# Patient Record
Sex: Female | Born: 1950 | Race: Black or African American | Hispanic: No | Marital: Single | Smoking: Never smoker
Health system: Southern US, Community
[De-identification: ages and names within clinical notes are randomized; demographics above are authoritative.]

## PROBLEM LIST (undated history)

## (undated) DIAGNOSIS — M359 Systemic involvement of connective tissue, unspecified: Secondary | ICD-10-CM

## (undated) DIAGNOSIS — I2510 Atherosclerotic heart disease of native coronary artery without angina pectoris: Secondary | ICD-10-CM

## (undated) DIAGNOSIS — E669 Obesity, unspecified: Secondary | ICD-10-CM

## (undated) DIAGNOSIS — Z973 Presence of spectacles and contact lenses: Secondary | ICD-10-CM

## (undated) DIAGNOSIS — I10 Essential (primary) hypertension: Secondary | ICD-10-CM

## (undated) DIAGNOSIS — M1990 Unspecified osteoarthritis, unspecified site: Secondary | ICD-10-CM

## (undated) DIAGNOSIS — IMO0001 Reserved for inherently not codable concepts without codable children: Secondary | ICD-10-CM

## (undated) DIAGNOSIS — I509 Heart failure, unspecified: Secondary | ICD-10-CM

## (undated) DIAGNOSIS — G4733 Obstructive sleep apnea (adult) (pediatric): Secondary | ICD-10-CM

## (undated) DIAGNOSIS — F419 Anxiety disorder, unspecified: Secondary | ICD-10-CM

## (undated) DIAGNOSIS — Z531 Procedure and treatment not carried out because of patient's decision for reasons of belief and group pressure: Secondary | ICD-10-CM

## (undated) DIAGNOSIS — G4730 Sleep apnea, unspecified: Secondary | ICD-10-CM

## (undated) DIAGNOSIS — D509 Iron deficiency anemia, unspecified: Secondary | ICD-10-CM

## (undated) DIAGNOSIS — E039 Hypothyroidism, unspecified: Secondary | ICD-10-CM

## (undated) DIAGNOSIS — Z9989 Dependence on other enabling machines and devices: Secondary | ICD-10-CM

## (undated) DIAGNOSIS — N189 Chronic kidney disease, unspecified: Secondary | ICD-10-CM

## (undated) DIAGNOSIS — E785 Hyperlipidemia, unspecified: Secondary | ICD-10-CM

## (undated) DIAGNOSIS — R0600 Dyspnea, unspecified: Secondary | ICD-10-CM

## (undated) DIAGNOSIS — R011 Cardiac murmur, unspecified: Secondary | ICD-10-CM

## (undated) DIAGNOSIS — Z9889 Other specified postprocedural states: Secondary | ICD-10-CM

## (undated) DIAGNOSIS — N289 Disorder of kidney and ureter, unspecified: Secondary | ICD-10-CM

## (undated) HISTORY — DX: Chronic diastolic (congestive) heart failure: I50.32

## (undated) HISTORY — PX: TOE SURGERY: SHX1073

## (undated) HISTORY — DX: Hypothyroidism, unspecified: E03.9

## (undated) HISTORY — DX: Reserved for inherently not codable concepts without codable children: IMO0001

## (undated) HISTORY — PX: LUMBAR DISC SURGERY: SHX700

## (undated) HISTORY — PX: KNEE SURGERY: SHX244

## (undated) HISTORY — DX: Other specified postprocedural states: Z98.890

## (undated) HISTORY — DX: Sleep apnea, unspecified: G47.30

## (undated) HISTORY — PX: BREAST BIOPSY: SHX20

## (undated) HISTORY — DX: Obesity, unspecified: E66.9

## (undated) HISTORY — PX: TREATMENT FISTULA ANAL: SUR1390

## (undated) HISTORY — DX: Procedure and treatment not carried out because of patient's decision for reasons of belief and group pressure: Z53.1

---

## 1980-02-05 HISTORY — PX: BREAST REDUCTION SURGERY: SHX8

## 1983-02-05 HISTORY — PX: REDUCTION MAMMAPLASTY: SUR839

## 1995-02-05 HISTORY — PX: TOTAL ABDOMINAL HYSTERECTOMY: SHX209

## 1999-02-22 ENCOUNTER — Encounter: Admission: RE | Admit: 1999-02-22 | Discharge: 1999-02-22 | Payer: Self-pay | Admitting: Obstetrics and Gynecology

## 1999-02-22 ENCOUNTER — Encounter: Payer: Self-pay | Admitting: Obstetrics and Gynecology

## 1999-03-06 ENCOUNTER — Encounter: Payer: Self-pay | Admitting: Obstetrics and Gynecology

## 1999-03-06 ENCOUNTER — Encounter: Admission: RE | Admit: 1999-03-06 | Discharge: 1999-03-06 | Payer: Self-pay | Admitting: Obstetrics and Gynecology

## 2000-04-10 ENCOUNTER — Inpatient Hospital Stay (HOSPITAL_COMMUNITY): Admission: RE | Admit: 2000-04-10 | Discharge: 2000-04-11 | Payer: Self-pay | Admitting: Neurosurgery

## 2000-08-23 ENCOUNTER — Ambulatory Visit (HOSPITAL_COMMUNITY): Admission: RE | Admit: 2000-08-23 | Discharge: 2000-08-23 | Payer: Self-pay | Admitting: Neurosurgery

## 2000-10-02 ENCOUNTER — Encounter: Admission: RE | Admit: 2000-10-02 | Discharge: 2000-12-31 | Payer: Self-pay

## 2000-10-27 ENCOUNTER — Encounter: Admission: RE | Admit: 2000-10-27 | Discharge: 2000-10-27 | Payer: Self-pay | Admitting: Orthopedic Surgery

## 2000-10-27 ENCOUNTER — Encounter: Payer: Self-pay | Admitting: Orthopedic Surgery

## 2000-12-03 ENCOUNTER — Encounter: Admission: RE | Admit: 2000-12-03 | Discharge: 2000-12-17 | Payer: Self-pay | Admitting: Neurosurgery

## 2000-12-04 ENCOUNTER — Ambulatory Visit (HOSPITAL_COMMUNITY): Admission: RE | Admit: 2000-12-04 | Discharge: 2000-12-04 | Payer: Self-pay | Admitting: Interventional Cardiology

## 2000-12-05 ENCOUNTER — Encounter: Payer: Self-pay | Admitting: Interventional Cardiology

## 2000-12-22 ENCOUNTER — Inpatient Hospital Stay (HOSPITAL_COMMUNITY): Admission: EM | Admit: 2000-12-22 | Discharge: 2000-12-25 | Payer: Self-pay | Admitting: Emergency Medicine

## 2000-12-22 ENCOUNTER — Encounter: Payer: Self-pay | Admitting: Emergency Medicine

## 2000-12-23 ENCOUNTER — Encounter: Payer: Self-pay | Admitting: Family Medicine

## 2000-12-24 ENCOUNTER — Encounter: Payer: Self-pay | Admitting: Family Medicine

## 2000-12-25 ENCOUNTER — Encounter: Admission: RE | Admit: 2000-12-25 | Discharge: 2000-12-25 | Payer: Self-pay | Admitting: Family Medicine

## 2001-04-24 ENCOUNTER — Ambulatory Visit (HOSPITAL_COMMUNITY): Admission: RE | Admit: 2001-04-24 | Discharge: 2001-04-24 | Payer: Self-pay | Admitting: Neurosurgery

## 2001-05-07 ENCOUNTER — Encounter: Payer: Self-pay | Admitting: Internal Medicine

## 2001-05-07 ENCOUNTER — Encounter: Admission: RE | Admit: 2001-05-07 | Discharge: 2001-05-07 | Payer: Self-pay | Admitting: Internal Medicine

## 2001-05-27 ENCOUNTER — Inpatient Hospital Stay (HOSPITAL_COMMUNITY): Admission: AD | Admit: 2001-05-27 | Discharge: 2001-05-31 | Payer: Self-pay | Admitting: Internal Medicine

## 2001-07-23 ENCOUNTER — Inpatient Hospital Stay (HOSPITAL_COMMUNITY): Admission: RE | Admit: 2001-07-23 | Discharge: 2001-07-31 | Payer: Self-pay | Admitting: Neurosurgery

## 2001-07-27 ENCOUNTER — Encounter: Payer: Self-pay | Admitting: Pulmonary Disease

## 2001-07-27 ENCOUNTER — Encounter: Payer: Self-pay | Admitting: Critical Care Medicine

## 2001-07-31 ENCOUNTER — Inpatient Hospital Stay (HOSPITAL_COMMUNITY)
Admission: RE | Admit: 2001-07-31 | Discharge: 2001-08-07 | Payer: Self-pay | Admitting: Physical Medicine & Rehabilitation

## 2001-07-31 ENCOUNTER — Encounter: Payer: Self-pay | Admitting: Physical Medicine & Rehabilitation

## 2001-12-05 ENCOUNTER — Ambulatory Visit (HOSPITAL_COMMUNITY): Admission: RE | Admit: 2001-12-05 | Discharge: 2001-12-05 | Payer: Self-pay | Admitting: Neurosurgery

## 2002-04-16 ENCOUNTER — Emergency Department (HOSPITAL_COMMUNITY): Admission: EM | Admit: 2002-04-16 | Discharge: 2002-04-17 | Payer: Self-pay | Admitting: Emergency Medicine

## 2002-10-27 ENCOUNTER — Ambulatory Visit (HOSPITAL_COMMUNITY): Admission: RE | Admit: 2002-10-27 | Discharge: 2002-10-27 | Payer: Self-pay | Admitting: Neurosurgery

## 2002-12-27 ENCOUNTER — Encounter: Admission: RE | Admit: 2002-12-27 | Discharge: 2002-12-27 | Payer: Self-pay | Admitting: Internal Medicine

## 2003-04-25 ENCOUNTER — Ambulatory Visit (HOSPITAL_BASED_OUTPATIENT_CLINIC_OR_DEPARTMENT_OTHER): Admission: RE | Admit: 2003-04-25 | Discharge: 2003-04-25 | Payer: Self-pay | Admitting: Internal Medicine

## 2003-04-25 ENCOUNTER — Encounter: Payer: Self-pay | Admitting: Internal Medicine

## 2003-09-08 ENCOUNTER — Encounter (HOSPITAL_COMMUNITY): Admission: RE | Admit: 2003-09-08 | Discharge: 2003-12-07 | Payer: Self-pay | Admitting: Interventional Cardiology

## 2003-09-13 ENCOUNTER — Encounter: Admission: RE | Admit: 2003-09-13 | Discharge: 2003-09-13 | Payer: Self-pay | Admitting: Interventional Cardiology

## 2003-09-14 ENCOUNTER — Ambulatory Visit (HOSPITAL_COMMUNITY): Admission: RE | Admit: 2003-09-14 | Discharge: 2003-09-14 | Payer: Self-pay | Admitting: Interventional Cardiology

## 2003-10-18 ENCOUNTER — Emergency Department (HOSPITAL_COMMUNITY): Admission: EM | Admit: 2003-10-18 | Discharge: 2003-10-18 | Payer: Self-pay | Admitting: Family Medicine

## 2003-10-21 ENCOUNTER — Ambulatory Visit (HOSPITAL_COMMUNITY): Admission: RE | Admit: 2003-10-21 | Discharge: 2003-10-21 | Payer: Self-pay | Admitting: Family Medicine

## 2004-02-23 ENCOUNTER — Encounter: Admission: RE | Admit: 2004-02-23 | Discharge: 2004-02-23 | Payer: Self-pay | Admitting: Obstetrics and Gynecology

## 2004-04-27 ENCOUNTER — Ambulatory Visit: Payer: Self-pay | Admitting: Internal Medicine

## 2005-04-25 ENCOUNTER — Ambulatory Visit: Payer: Self-pay | Admitting: Internal Medicine

## 2005-05-09 ENCOUNTER — Observation Stay (HOSPITAL_COMMUNITY): Admission: EM | Admit: 2005-05-09 | Discharge: 2005-05-10 | Payer: Self-pay | Admitting: Emergency Medicine

## 2005-05-15 ENCOUNTER — Encounter: Admission: RE | Admit: 2005-05-15 | Discharge: 2005-05-15 | Payer: Self-pay | Admitting: Internal Medicine

## 2005-12-11 ENCOUNTER — Encounter: Admission: RE | Admit: 2005-12-11 | Discharge: 2005-12-11 | Payer: Self-pay | Admitting: Neurosurgery

## 2006-03-07 ENCOUNTER — Ambulatory Visit: Payer: Self-pay | Admitting: Internal Medicine

## 2006-05-15 ENCOUNTER — Encounter: Admission: RE | Admit: 2006-05-15 | Discharge: 2006-05-15 | Payer: Self-pay | Admitting: Obstetrics and Gynecology

## 2007-01-08 DIAGNOSIS — E669 Obesity, unspecified: Secondary | ICD-10-CM | POA: Insufficient documentation

## 2007-01-08 DIAGNOSIS — J3089 Other allergic rhinitis: Secondary | ICD-10-CM

## 2007-01-08 DIAGNOSIS — G4733 Obstructive sleep apnea (adult) (pediatric): Secondary | ICD-10-CM | POA: Insufficient documentation

## 2007-01-08 DIAGNOSIS — J302 Other seasonal allergic rhinitis: Secondary | ICD-10-CM | POA: Insufficient documentation

## 2007-01-30 ENCOUNTER — Emergency Department (HOSPITAL_COMMUNITY): Admission: EM | Admit: 2007-01-30 | Discharge: 2007-01-30 | Payer: Self-pay | Admitting: Emergency Medicine

## 2007-02-12 ENCOUNTER — Ambulatory Visit: Payer: Self-pay | Admitting: Internal Medicine

## 2007-02-12 DIAGNOSIS — E039 Hypothyroidism, unspecified: Secondary | ICD-10-CM | POA: Insufficient documentation

## 2007-02-18 ENCOUNTER — Encounter: Payer: Self-pay | Admitting: Internal Medicine

## 2007-05-18 ENCOUNTER — Ambulatory Visit: Payer: Self-pay | Admitting: Internal Medicine

## 2007-05-21 ENCOUNTER — Encounter: Admission: RE | Admit: 2007-05-21 | Discharge: 2007-05-21 | Payer: Self-pay | Admitting: Obstetrics and Gynecology

## 2007-07-01 ENCOUNTER — Encounter
Admission: RE | Admit: 2007-07-01 | Discharge: 2007-07-01 | Payer: Self-pay | Admitting: Physical Medicine and Rehabilitation

## 2008-02-27 ENCOUNTER — Emergency Department (HOSPITAL_COMMUNITY): Admission: EM | Admit: 2008-02-27 | Discharge: 2008-02-27 | Payer: Self-pay | Admitting: Emergency Medicine

## 2008-03-17 ENCOUNTER — Encounter: Payer: Self-pay | Admitting: Internal Medicine

## 2008-03-21 ENCOUNTER — Telehealth (INDEPENDENT_AMBULATORY_CARE_PROVIDER_SITE_OTHER): Payer: Self-pay | Admitting: *Deleted

## 2008-03-30 ENCOUNTER — Telehealth: Payer: Self-pay | Admitting: Internal Medicine

## 2008-05-02 ENCOUNTER — Ambulatory Visit: Payer: Self-pay | Admitting: Internal Medicine

## 2008-05-05 ENCOUNTER — Emergency Department (HOSPITAL_COMMUNITY): Admission: EM | Admit: 2008-05-05 | Discharge: 2008-05-05 | Payer: Self-pay | Admitting: Emergency Medicine

## 2008-05-06 ENCOUNTER — Ambulatory Visit: Admission: RE | Admit: 2008-05-06 | Discharge: 2008-05-06 | Payer: Self-pay | Admitting: Emergency Medicine

## 2008-05-06 ENCOUNTER — Encounter (INDEPENDENT_AMBULATORY_CARE_PROVIDER_SITE_OTHER): Payer: Self-pay | Admitting: Emergency Medicine

## 2008-05-06 ENCOUNTER — Ambulatory Visit: Payer: Self-pay | Admitting: Surgery

## 2008-05-07 ENCOUNTER — Emergency Department (HOSPITAL_COMMUNITY): Admission: EM | Admit: 2008-05-07 | Discharge: 2008-05-08 | Payer: Self-pay | Admitting: Emergency Medicine

## 2008-05-23 ENCOUNTER — Encounter: Admission: RE | Admit: 2008-05-23 | Discharge: 2008-05-23 | Payer: Self-pay | Admitting: Internal Medicine

## 2008-06-13 ENCOUNTER — Ambulatory Visit: Payer: Self-pay | Admitting: Surgery

## 2008-06-14 ENCOUNTER — Ambulatory Visit: Payer: Self-pay | Admitting: Surgery

## 2008-08-18 ENCOUNTER — Telehealth: Payer: Self-pay | Admitting: Internal Medicine

## 2008-08-24 ENCOUNTER — Emergency Department (HOSPITAL_COMMUNITY): Admission: EM | Admit: 2008-08-24 | Discharge: 2008-08-24 | Payer: Self-pay | Admitting: Emergency Medicine

## 2008-08-24 ENCOUNTER — Encounter: Payer: Self-pay | Admitting: Internal Medicine

## 2008-09-12 ENCOUNTER — Ambulatory Visit: Payer: Self-pay | Admitting: Surgery

## 2008-09-25 ENCOUNTER — Encounter: Payer: Self-pay | Admitting: Internal Medicine

## 2008-09-29 ENCOUNTER — Encounter: Admission: RE | Admit: 2008-09-29 | Discharge: 2008-09-29 | Payer: Self-pay | Admitting: Neurosurgery

## 2008-11-07 ENCOUNTER — Encounter: Payer: Self-pay | Admitting: Internal Medicine

## 2008-12-10 ENCOUNTER — Emergency Department (HOSPITAL_COMMUNITY): Admission: EM | Admit: 2008-12-10 | Discharge: 2008-12-10 | Payer: Self-pay | Admitting: Emergency Medicine

## 2009-01-31 ENCOUNTER — Emergency Department (HOSPITAL_COMMUNITY): Admission: EM | Admit: 2009-01-31 | Discharge: 2009-01-31 | Payer: Self-pay | Admitting: Family Medicine

## 2009-02-10 ENCOUNTER — Emergency Department (HOSPITAL_COMMUNITY): Admission: EM | Admit: 2009-02-10 | Discharge: 2009-02-10 | Payer: Self-pay | Admitting: Family Medicine

## 2009-04-23 ENCOUNTER — Emergency Department (HOSPITAL_COMMUNITY): Admission: EM | Admit: 2009-04-23 | Discharge: 2009-04-23 | Payer: Self-pay | Admitting: Emergency Medicine

## 2009-05-02 ENCOUNTER — Ambulatory Visit: Payer: Self-pay | Admitting: Internal Medicine

## 2009-05-02 DIAGNOSIS — R011 Cardiac murmur, unspecified: Secondary | ICD-10-CM | POA: Insufficient documentation

## 2009-06-02 ENCOUNTER — Encounter: Admission: RE | Admit: 2009-06-02 | Discharge: 2009-06-02 | Payer: Self-pay | Admitting: Internal Medicine

## 2009-06-26 ENCOUNTER — Emergency Department (HOSPITAL_COMMUNITY): Admission: EM | Admit: 2009-06-26 | Discharge: 2009-06-26 | Payer: Self-pay | Admitting: Emergency Medicine

## 2009-08-28 ENCOUNTER — Emergency Department (HOSPITAL_COMMUNITY): Admission: EM | Admit: 2009-08-28 | Discharge: 2009-08-28 | Payer: Self-pay | Admitting: Family Medicine

## 2010-02-25 ENCOUNTER — Encounter: Payer: Self-pay | Admitting: Interventional Cardiology

## 2010-02-26 ENCOUNTER — Encounter: Payer: Self-pay | Admitting: Surgery

## 2010-03-06 NOTE — Miscellaneous (Signed)
Summary: CPAP Set Up Info/[facility]  CPAP Set Up Info/[facility]   Imported By: Edmonia James 08/30/2008 12:19:27  _____________________________________________________________________  External Attachment:    Type:   Image     Comment:   External Document

## 2010-03-06 NOTE — Assessment & Plan Note (Signed)
Summary: FU///KP   Visit Type:  Follow-up PCP:  Avebuere  Chief Complaint:  check up.  History of Present Illness: OSA Allergic rhinits     Using CPAP only when not sleeping well- takes off feeling smothered. Using once/week. does feel more rested when uses it. Gets pressure "sinus headache" not related to cpap Uses a cortisone nasal spray and daily astelin   Current Allergies: No known allergies   Past Medical History:    Reviewed history and no changes required:       Back surgery x 2       arthroscopy       obesity       hypothyroid   Family History:    Reviewed history and no changes required:  Social History:    Patient never smoked.    Risk Factors:  Tobacco use:  never   Review of Systems  ENT      Complains of nasal congestion.      Denies ringing in the ears, decreased hearing, nosebleeds, difficulty swallowing, and hoarseness.  CV      Denies difficulty breathing at night, chest pain or discomfort, racing/skipping heart beats, fatigue, palpitations, swelling of hands or feet, and difficulty breathing while lying down.  Resp      Denies cough, coughing up blood, chest discomfort, and wheezing.      not snoring and less sleepiness if she wears cpap   Vital Signs:  Patient Profile:   60 Years Old Female Weight:      347 pounds O2 Sat:      98 % O2 treatment:    Room Air Pulse rate:   85 / minute BP sitting:   160 / 100  (left arm) Cuff size:   large  Vitals Entered By: Marcelino Freestone RMA (February 12, 2007 11:59 AM)             Is Patient Diabetic? Yes  Comments meds reviewed ..................................................................Marland KitchenMarcelino Freestone RMA  February 12, 2007 12:05 PM      Physical Exam  General:     obese.   Eyes:     disconjugate Mouth:     no deformity or lesions   Melampatti Class III.   Neck:     no masses, thyromegaly, or abnormal cervical nodes Chest Wall:     no deformities noted Lungs:  clear bilaterally to auscultation and percussion Heart:     regular rate and rhythm, S1, S2 without murmurs, rubs, gallops, or clicks     Impression & Recommendations:  Problem # 1:  OBSTRUCTIVE SLEEP APNEA (ICD-327.23) Inadequate cpap compliance will autotitrate Orders: Est. Patient Level III [id number]) DME Referral (DME)   Problem # 2:  ALLERGIC RHINITIS (ICD-477.9) discussed Neti pot for saline lavage Her updated medication list for this problem includes:    Astelin 137 Mcg/spray Soln (Azelastine hcl) .Marland Kitchen... As needed  Orders: Est. Patient Level III [id number])    Patient Instructions: 1)  Please schedule a follow-up appointment in 3 months. 2)  Try Neti pot for sinuses 3)  [facility] will work withy you to recheck cpap pressure    ]

## 2010-03-06 NOTE — Assessment & Plan Note (Signed)
Summary: FOLLOW UP/ MBW   Visit Type:  Follow-up PCP:  Avebuere  Chief Complaint:  follow up.  History of Present Illness: Current Problems:  UNSPECIFIED HYPOTHYROIDISM (ICD-244.9) ALLERGIC RHINITIS (ICD-477.9) OBESITY (ICD-278.00) OBSTRUCTIVE SLEEP APNEA (ICD-327.23)  She returns for follow-up of allergic rhinitis and for obstructive sleep apnea.  She has liked the Neti pot and is using it daily.  There has been little pollen problem so far this season.  She continues using CPAP at 11 CWP.  She is not having sinus headaches and credits the Neti pote for this.  Shehas not needed Astelin.  She is satisfied with her current treatment.       Current Allergies (reviewed today): ! SULFA  Past Medical History:    Reviewed history from 02/12/2007 and no changes required:       Back surgery x 2       arthroscopy       obesity       hypothyroid   Social History:    Reviewed history from 02/12/2007 and no changes required:       Patient never smoked.     Review of Systems      See HPI   Vital Signs:  Patient Profile:   60 Years Old Female Weight:      335 pounds O2 Sat:      100 % O2 treatment:    Room Air Pulse rate:   65 / minute BP sitting:   140 / 80  (right arm) Cuff size:   follow large  Vitals Entered By: Marcelino Freestone RMA (May 18, 2007 10:06 AM)             Is Patient Diabetic? Yes  Comments Medications reviewed with patient ..................................................................Marland KitchenMarcelino Freestone RMA  May 18, 2007 10:10 AM      Physical Exam  General:     obese.  alert  Eyes:     amblyopia conjunctivae are normal Nose:     pale mucosa, clear mucus Neck:     no JVD.   Lungs:     clear bilaterally to auscultation and percussion Heart:     regular rate and rhythm, S1, S2 without murmurs, rubs, gallops, or clicks Cervical Nodes:     no significant adenopathy     Impression & Recommendations:  Problem # 1:  ALLERGIC RHINITIS  (ICD-477.9) Good control, satisfacory to her. She will continue using the Neti pot through the pollen season. Her updated medication list for this problem includes:    Astelin 137 Mcg/spray Soln (Azelastine hcl) .Marland Kitchen... As needed   Problem # 2:  OBSTRUCTIVE SLEEP APNEA (ICD-327.23) Good control and compliant with Cpap 11, but she would be betrter if she lost weight.  Medications Added to Medication List This Visit: 1)  Allopurinol 100 Mg Tabs (Allopurinol) .... One tablet two times a day 2)  Cpap 11 Cwp [facility]    Patient Instructions: 1)  Please schedule a follow-up appointment in 1 year. 2)  Continue Neti Pot as needed 3)  Continue your cpap at 11 cwp    ]

## 2010-03-06 NOTE — Assessment & Plan Note (Signed)
Summary: 12 months/apc   Primary Provider/Referring Provider:  Luretha Rued  CC:  Yearly follow up visit-no complaints..  History of Present Illness:  05/18/07- Allergic Rhinits, OSA She returns for follow-up of allergic rhinitis and for obstructive sleep apnea.  She has liked the Neti pot and is using it daily.  There has been little pollen problem so far this season.  She continues using CPAP at 11 CWP.  She is not having sinus headaches and credits the Neti pot for this.  She has not needed Astelin.  She is satisfied with her current treatment.  05/24/08- Allergic rhinitis, OSA Rhinitis- good winter without significant respiratory events. No longer needs to use Astelin since she began using Neti pot- currently used about twice weekly. Seasonal itching of nose and eyes with sneezing began for this spring about 2 weeks ago. Discussed antihistamines.  Sleep- Still uncomfortable with cpap- rarely uses it now- says it stings her nose even with humidifier. nasal saline gel helps some. Sleeps alone, not aware of snoring. Denies daytime sleepiness. Often WASO 3AM and may not get back to sleep even with Lunesta. Doesn't take naps. Humidifier in bedroom helps sleep.   05/24/09- Allergic rhinitis, OSA Noted mild sneeze and sniff before the rains pushed pollen counts down. Able to use CPAP at 10 and says pressure of 10 is ok. Indicates good compliance and denies daytime sleepiness.   Current Medications (verified): 1)  Synthroid 75 Mcg  Tabs (Levothyroxine Sodium) .... Take 1 Tablet By Mouth Three Times A Day 2)  Metformin Hcl 500 Mg Tabs (Metformin Hcl) .Marland Kitchen.. 1 Two Times A Day 3)  Clonidine Hcl 0.2 Mg/24hr Ptwk (Clonidine Hcl) .... Once A Week 4)  Crestor 10 Mg  Tabs (Rosuvastatin Calcium) .... Take 1 Tablet By Mouth Once A Day 5)  Allopurinol 100 Mg  Tabs (Allopurinol) .... One Tablet Two Times A Day 6)  Alprazolam 0.25 Mg  Tabs (Alprazolam) .... Take 1 Tablet By Mouth Two Times A Day As  Needed 7)  Benicar Hct 40-25 Mg Tabs (Olmesartan Medoxomil-Hctz) .... Take 1 By Mouth Once Daily 8)  Hydrocodone-Acetaminophen 5-500 Mg  Tabs (Hydrocodone-Acetaminophen) .... As Needed 9)  Lantus 100 Unit/ml  Soln (Insulin Glargine) .... As Directed 10)  Cpap 10 Cwp [facility] 11)  Cpap Mask of Choice, Supplies 12)  Topiramate 25 Mg Tabs (Topiramate) .Marland Kitchen.. 1 At Bedtime As Needed 13)  Aspirin 81 Mg Tbec (Aspirin) .... Take 1 By Mouth Once Daily 14)  Klor-Con M20 20 Meq Cr-Tabs (Potassium Chloride Crys Cr) .... 2 Once Daily As Needed 15)  Torsemide 20 Mg Tabs (Torsemide) .... Take 1 By Mouth Every Other Day 16)  Zolpidem Tartrate 10 Mg Tabs (Zolpidem Tartrate) .Marland Kitchen.. 1 By Mouth At Bedtime As Needed 17)  Lotrel 5-10 Mg Caps (Amlodipine Besy-Benazepril Hcl) .... Take 1 By Mouth Once Daily  Allergies (verified): 1)  ! 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    Vital Signs:  Patient profile:   60 year old female Height:      70 inches Weight:      300.38 pounds BMI:     43.26 O2 Sat:      100 % on Room air Pulse rate:   73 / minute BP sitting:   124 / 80  (left arm) Cuff size:   large  Vitals Entered By: Clayborne Dana CMA (May 02, 2009 9:45 AM)  O2 Flow:  Room air  Physical Exam  Additional Exam:  General: A/Ox3; pleasant and cooperative, NAD, obese SKIN: no rash, lesions NODES: no lymphadenopathy HEENT: [place]/AT, EOM- WNL, Strabismus, Conjuctivae- clear, PERRLA, TM-WNL, Nose- clear, Throat- clear and wnl, Mallampati  III-IV NECK: Supple w/ fair ROM, JVD- none, normal carotid impulses w/o bruits Thyroid- normal to palpation CHEST: Clear to P&A HEART: RRR, 2/6 systolic precordial murmur ABDOMEN: obese [id number], nl pulses, no edema  NEURO: Grossly intact to observation      Impression & Recommendations:  Problem # 1:  OBSTRUCTIVE SLEEP APNEA (ICD-327.23)  Weight loss would help. Compliance and pressure control seem adequate. She is going for a replacement mask and we discussed choices.  Problem # 2:  ALLERGIC RHINITIS (ICD-477.9)  Mild seasonal rhinitis should respond to occasional antihistamine.  Medications Added to Medication List This Visit: 1)  Clonidine Hcl 0.2 Mg/24hr Ptwk (Clonidine hcl) .... Once a week 2)  Benicar Hct 40-25 Mg Tabs (Olmesartan medoxomil-hctz) .... Take 1 by mouth once daily 3)  Aspirin 81 Mg Tbec (Aspirin) .... Take 1 by mouth once daily 4)  Torsemide 20 Mg Tabs (Torsemide) .... Take 1 by mouth every other day 5)  Lotrel 5-10 Mg Caps (Amlodipine besy-benazepril hcl) .... Take 1 by mouth once daily  Other Orders: Est. Patient Level III [id number])  Patient Instructions: 1)  Please schedule a follow-up appointment in 1 year. 2)  Continue CPAP at 10, all night every night. Call if you have problems

## 2010-03-06 NOTE — Progress Notes (Signed)
Summary: Tiffany [name] rx  Phone Note From Pharmacy   [facility] [address] [phone]* Call For: Tiffany [name]  Summary of Call: Prior auth received from pharmacy for Ambien CR 12.5mg .  Insurance prefers [place].  Changed rx to Lunesta 2mg  Take 1 tab by mouth at bedtime as needed #30 with 5 refills per CY.  Called pharmacy gave verbal order.   Initial call taken by: Freddrick March RN,  March 30, 2008 4:19 PM    New/Updated Medications: LUNESTA 2 MG TABS (ESZOPICLONE) Take 1 tab by mouth at bedtime as needed   Prescriptions: LUNESTA 2 MG TABS (ESZOPICLONE) Take 1 tab by mouth at bedtime as needed  #30 x 5   Entered by:   Freddrick March RN   Authorized by:   Deneise Lever MD   Signed by:   Freddrick March RN on 03/30/2008   Method used:   Telephoned to ...       [facility] [address] [phone]* (retail)       [address]       [place], [place]  [address]       Ph: [phone] or [phone]       Fax: [phone]   RxID:   [id number]

## 2010-03-06 NOTE — Miscellaneous (Signed)
Summary: change ambien to zolpidem  Clinical Lists Changes received paper request from pharmacy, stating that patient wants to change from ambien 12.5 to one of the alternatives.  Dr. Tenna Child for patient to be switched to Zolpidem (which was given as an option that is covered by pt's inurance) 10mg  #30, 1 by mouth at bedtime as needed with as needed refills.  LMOM to notify pt of this change, and med added to med list/telephoned to pt's pharmacy.  Parke Poisson CNA  March 17, 2008 4:12 PM  Medications: Changed medication from [facility] CR 12.5 MG  TBCR (ZOLPIDEM TARTRATE) Take 1 tab by mouth at bedtime as needed to ZOLPIDEM TARTRATE 10 MG TABS (ZOLPIDEM TARTRATE) Take 1 tab by mouth at bedtime as needed - Signed Rx of ZOLPIDEM TARTRATE 10 MG TABS (ZOLPIDEM TARTRATE) Take 1 tab by mouth at bedtime as needed;  #30 x PRN;  Signed;  Entered by: Parke Poisson CNA;  Authorized by: Deneise Lever MD;  Method used: [facility] [address] [phone]*, [address] [place], [place], [place]  [address], Ph: [phone] or [phone], Fax: [phone]    Prescriptions: ZOLPIDEM TARTRATE 10 MG TABS (ZOLPIDEM TARTRATE) Take 1 tab by mouth at bedtime as needed  #30 x PRN   Entered by:   Parke Poisson CNA   Authorized by:   Deneise Lever MD   Signed by:   Parke Poisson CNA on 03/17/2008   Method used:   Telephoned to ...       [facility] [address] [phone]* (retail)       [address]       [place], [place]  [address]       Ph: [phone] or [phone]       Fax: [phone]   RxID:   [id number]

## 2010-03-06 NOTE — Assessment & Plan Note (Signed)
Summary: rov/apc   Primary Provider/Referring Provider:  Luretha Rued  CC:  Annual followup.  Pt denies any complaits today.Tiffany [name]  History of Present Illness: Current Problems:  UNSPECIFIED HYPOTHYROIDISM (ICD-244.9) ALLERGIC RHINITIS (ICD-477.9) OBESITY (ICD-278.00) OBSTRUCTIVE SLEEP APNEA (ICD-327.23)  05/18/07- Allergic Rhinits, OSA She returns for follow-up of allergic rhinitis and for obstructive sleep apnea.  She has liked the Neti pot and is using it daily.  There has been little pollen problem so far this season.  She continues using CPAP at 11 CWP.  She is not having sinus headaches and credits the Neti pot for this.  She has not needed Astelin.  She is satisfied with her current treatment.  05-26-08- Allergic rhinitis, OSA Rhinitis- good winter without significant respiratory events. No longer needs to use Astelin since she began using Neti pot- currently used about twice weekly. Seasonal itching of nose and eyes with sneezing began for this spring about 2 weeks ago. Discussed antihistamines.  Sleep- Still uncomfortable with cpap- rarely uses it now- says it stings her nose even with humidifier. nasal saline gel helps some. Sleeps alone, not aware of snoring. Denies daytime sleepiness. Often WASO 3AM and may not get back to sleep even with Lunesta. Doesn't take naps. Humidifier in bedroom helps sleep.        Current Medications (verified): 1)  Synthroid 75 Mcg  Tabs (Levothyroxine Sodium) .... Take 1 Tablet By Mouth Three Times A Day 2)  Metformin Hcl 500 Mg Tabs (Metformin Hcl) .Tiffany [name].. 1 Two Times A Day 3)  Clonidine Hcl 0.2 Mg  Tabs (Clonidine Hcl) .... Take 1 Tablet By Mouth Three Times A Day 4)  Crestor 10 Mg  Tabs (Rosuvastatin Calcium) .... Take 1 Tablet By Mouth Once A Day 5)  Allopurinol 100 Mg  Tabs (Allopurinol) .... One Tablet Two Times A Day 6)  Alprazolam 0.25 Mg  Tabs (Alprazolam) .... Take 1 Tablet By Mouth Two Times A Day As Needed 7)  Avalide 300-25 Mg  Tabs  (Irbesartan-Hydrochlorothiazide) .... Take 1 Tablet By Mouth Once A Day 8)  Hydrocodone-Acetaminophen 5-500 Mg  Tabs (Hydrocodone-Acetaminophen) .... As Needed 9)  Lantus 100 Unit/ml  Soln (Insulin Glargine) .... As Directed 10)  Cpap 11 Cwp [facility] 11)  Topiramate 25 Mg Tabs (Topiramate) .Tiffany [name].. 1 At Bedtime As Needed 12)  Bayer Aspirin 325 Mg Tabs (Aspirin) .Tiffany [name].. 1 Every Am 13)  Klor-Con M20 20 Meq Cr-Tabs (Potassium Chloride Crys Cr) .... 2 Once Daily As Needed 14)  Torsemide 20 Mg Tabs (Torsemide) .... 2 Once Daily As Needed 15)  Nexium 40 Mg Cpdr (Esomeprazole Magnesium) .Tiffany [name].. 1 Once Daily As Needed  Allergies (verified): 1)  ! 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        School bus driver  Review  of Systems      See HPI       Denies headache,  chest pain, dyspnea, n/v/d, weight loss, fever, edema.    Vital Signs:  Patient profile:   60 year old female Height:      70 inches Weight:      310 pounds BMI:     44.64 O2 Sat:      57 % BP sitting:   124 / 78  (left arm) Cuff size:   large  Vitals Entered By: Tilden Dome (May 02, 2008 9:41 AM)  O2 Sat on room air at rest %:  100  Physical Exam  Additional Exam:  General: A/Ox3; pleasant and cooperative, NAD, SKIN: no rash, lesions NODES: no lymphadenopathy HEENT: [place]/AT, EOM- WNL, Strabismus, Conjuctivae- clear, PERRLA, TM-WNL, Nose- clear, Throat- clear and wnl NECK: Supple w/ fair ROM, JVD- none, normal carotid impulses w/o bruits Thyroid- normal to palpation CHEST: Clear to P&A HEART: RRR, no m/g/r heard ABDOMEN: obese [id number], nl pulses, no edema  NEURO: Grossly intact to observation      Pulmonary Function Test Height (in.): 70 Gender: Female  Impression & Recommendations:  Problem # 1:  OBSTRUCTIVE SLEEP APNEA (ICD-327.23) Noncompliant/ intolerant of cpap. Needs to lose weight. We discussed alternatives. Not motivated.  Problem # 2:  ALLERGIC RHINITIS (ICD-477.9) Available symptom therapy reviewed. The following medications were removed from the medication list:    Astelin 137 Mcg/spray Soln (Azelastine hcl) .Tiffany [name]... As needed  Problem # 3:  OBSTRUCTIVE SLEEP APNEA (ICD-327.23)  Problem # 4:  ALLERGIC RHINITIS (ICD-477.9)  Complete Medication List: 1)  Synthroid 75 Mcg Tabs (Levothyroxine sodium) .... Take 1 tablet by mouth three times a day 2)  Metformin Hcl 500 Mg Tabs (Metformin hcl) .Tiffany [name].. 1 two times a day 3)  Clonidine Hcl 0.2 Mg Tabs (Clonidine hcl) .... Take 1 tablet by mouth three times a day 4)  Crestor 10 Mg Tabs (Rosuvastatin calcium) .... Take 1 tablet by mouth once a day 5)  Allopurinol 100 Mg Tabs (Allopurinol) .... One tablet two times a day 6)  Alprazolam 0.25 Mg Tabs (Alprazolam)  .... Take 1 tablet by mouth two times a day as needed 7)  Avalide 300-25 Mg Tabs (Irbesartan-hydrochlorothiazide) .... Take 1 tablet by mouth once a day 8)  Hydrocodone-acetaminophen 5-500 Mg Tabs (Hydrocodone-acetaminophen) .... As needed 9)  Lantus 100 Unit/ml Soln (Insulin glargine) .... As directed 10)  Cpap 10 Cwp [facility]  11)  Topiramate 25 Mg Tabs (Topiramate) .Tiffany [name].. 1 at bedtime as needed 12)  Bayer Aspirin 325 Mg  Tabs (Aspirin) .Tiffany [name].. 1 every am 13)  Klor-con M20 20 Meq Cr-tabs (Potassium chloride crys cr) .... 2 once daily as needed 14)  Torsemide 20 Mg Tabs (Torsemide) .... 2 once daily as needed 15)  Nexium 40 Mg Cpdr (Esomeprazole magnesium) .Tiffany [name].. 1 once daily as needed  Other Orders: Est. Patient Level III [id number]) DME Referral (DME)  Patient Instructions: 1)  Please schedule a follow-up appointment in 1 year. 2)  We will contact [facility] to reduce your cpap to 10. 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

## 2010-03-06 NOTE — Progress Notes (Signed)
Summary: c pap  Phone Note Call from Patient   Caller: Patient Call For: S[place] Summary of Call: need new prescript for c pap machine Initial call taken by: Gustavus Bryant,  August 18, 2008 11:36 AM  Follow-up for Phone Call        CY  is this ok to send order for pt to get pt a new mask.  please advise.  thanks [place]  August 18, 2008 11:42 AM   Additional Follow-up for Phone Call Additional follow up Details #1::        Script written for [facility]- I wrote for mask. Did she need machine or mask?? Additional Follow-up by: Deneise Lever MD,  August 19, 2008 1:29 PM    Additional Follow-up for Phone Call Additional follow up Details #2::    Called pt and she stated that [facility] advised her that she has had cpap machine for over 5 years and she would be eligible for a replacement cpap machine. Pt requesting order for a replacement cpap. Please advise. Rhonda Cobb  August 19, 2008 2:32 PM  New Order sent to [facility] for replacement CPAP machine set at 10 cwp. h/h and supplies. Called pt and she is aware that order was sent this am to [facility]. Rhonda Cobb  August 22, 2008 8:52 AM   New/Updated Medications: * CPAP MASK OF CHOICE, SUPPLIES  Prescriptions: CPAP MASK OF CHOICE, SUPPLIES   #1 x prn   Entered and Authorized by:   Deneise Lever MD   Signed by:   Deneise Lever MD on 08/19/2008   Method used:   Historical   [id number]

## 2010-03-06 NOTE — Progress Notes (Signed)
Summary: prescript  Phone Note Call from Patient   Caller: Patient Call For: Mccann Summary of Call: pharmacy did not receive ambiem prescription pls re send Initial call taken by: Gustavus Bryant,  March 21, 2008 8:33 AM  Follow-up for Phone Call        [facility] that rx [facility] generic ambien 10 mg [facility] [address] Follow-up by: Doroteo Glassman RN,  March 21, 2008 8:50 AM

## 2010-04-21 LAB — POCT URINALYSIS DIP (DEVICE)
Bilirubin Urine: NEGATIVE
Glucose, UA: NEGATIVE mg/dL
Hgb urine dipstick: NEGATIVE
Ketones, ur: NEGATIVE mg/dL
Nitrite: NEGATIVE
Protein, ur: 100 mg/dL — AB
Specific Gravity, Urine: 1.02 (ref 1.005–1.030)
Urobilinogen, UA: 0.2 mg/dL (ref 0.0–1.0)
pH: 7 (ref 5.0–8.0)

## 2010-04-21 LAB — POCT I-STAT, CHEM 8
BUN: 45 mg/dL — ABNORMAL HIGH (ref 6–23)
Calcium, Ion: 1.18 mmol/L (ref 1.12–1.32)
Chloride: 111 mEq/L (ref 96–112)
Creatinine, Ser: 1.9 mg/dL — ABNORMAL HIGH (ref 0.4–1.2)
Glucose, Bld: 113 mg/dL — ABNORMAL HIGH (ref 70–99)
HCT: 40 % (ref 36.0–46.0)
Hemoglobin: 13.6 g/dL (ref 12.0–15.0)
Potassium: 4.8 mEq/L (ref 3.5–5.1)
Sodium: 140 mEq/L (ref 135–145)
TCO2: 24 mmol/L (ref 0–100)

## 2010-04-21 LAB — URINE CULTURE
Colony Count: NO GROWTH
Culture: NO GROWTH

## 2010-04-27 LAB — POCT URINALYSIS DIP (DEVICE)
Bilirubin Urine: NEGATIVE
Glucose, UA: NEGATIVE mg/dL
Hgb urine dipstick: NEGATIVE
Ketones, ur: NEGATIVE mg/dL
Nitrite: NEGATIVE
Protein, ur: 100 mg/dL — AB
Specific Gravity, Urine: 1.015 (ref 1.005–1.030)
Urobilinogen, UA: 0.2 mg/dL (ref 0.0–1.0)
pH: 5.5 (ref 5.0–8.0)

## 2010-04-27 LAB — BASIC METABOLIC PANEL
BUN: 60 mg/dL — ABNORMAL HIGH (ref 6–23)
CO2: 23 mEq/L (ref 19–32)
Calcium: 9.2 mg/dL (ref 8.4–10.5)
Chloride: 110 mEq/L (ref 96–112)
Creatinine, Ser: 1.57 mg/dL — ABNORMAL HIGH (ref 0.4–1.2)
GFR calc Af Amer: 41 mL/min — ABNORMAL LOW (ref >60)
GFR calc non Af Amer: 34 mL/min — ABNORMAL LOW (ref >60)
Glucose, Bld: 129 mg/dL — ABNORMAL HIGH (ref 70–99)
Potassium: 6 mEq/L — ABNORMAL HIGH (ref 3.5–5.1)
Sodium: 136 mEq/L (ref 135–145)

## 2010-04-27 LAB — CBC
HCT: 36.1 % (ref 36.0–46.0)
Hemoglobin: 11.3 g/dL — ABNORMAL LOW (ref 12.0–15.0)
MCHC: 31.4 g/dL (ref 30.0–36.0)
MCV: 85.3 fL (ref 78.0–100.0)
Platelets: 203 10*3/uL (ref 150–400)
RBC: 4.23 MIL/uL (ref 3.87–5.11)
RDW: 16.8 % — ABNORMAL HIGH (ref 11.5–15.5)
WBC: 9.1 10*3/uL (ref 4.0–10.5)

## 2010-05-02 ENCOUNTER — Ambulatory Visit: Payer: Self-pay | Admitting: Internal Medicine

## 2010-05-09 LAB — POCT URINALYSIS DIP (DEVICE)
Bilirubin Urine: NEGATIVE
Glucose, UA: NEGATIVE mg/dL
Ketones, ur: NEGATIVE mg/dL
Nitrite: NEGATIVE
Protein, ur: 30 mg/dL — AB
Specific Gravity, Urine: <=1.005 (ref 1.005–1.030)
Urobilinogen, UA: 0.2 mg/dL (ref 0.0–1.0)
pH: 5 (ref 5.0–8.0)

## 2010-05-09 LAB — HEMOCCULT GUIAC POC 1CARD (OFFICE): Fecal Occult Bld: NEGATIVE

## 2010-05-14 ENCOUNTER — Other Ambulatory Visit: Payer: Self-pay | Admitting: Obstetrics and Gynecology

## 2010-05-14 DIAGNOSIS — Z1231 Encounter for screening mammogram for malignant neoplasm of breast: Secondary | ICD-10-CM

## 2010-05-16 LAB — DIFFERENTIAL
Basophils Absolute: 0 10*3/uL (ref 0.0–0.1)
Basophils Relative: 0 % (ref 0–1)
Eosinophils Absolute: 0.2 10*3/uL (ref 0.0–0.7)
Eosinophils Relative: 2 % (ref 0–5)
Lymphocytes Relative: 21 % (ref 12–46)
Lymphs Abs: 1.6 10*3/uL (ref 0.7–4.0)
Monocytes Absolute: 0.7 10*3/uL (ref 0.1–1.0)
Monocytes Relative: 9 % (ref 3–12)
Neutro Abs: 5.1 10*3/uL (ref 1.7–7.7)
Neutrophils Relative %: 67 % (ref 43–77)

## 2010-05-16 LAB — POCT I-STAT, CHEM 8
BUN: 40 mg/dL — ABNORMAL HIGH (ref 6–23)
Calcium, Ion: 1.28 mmol/L (ref 1.12–1.32)
Chloride: 113 mEq/L — ABNORMAL HIGH (ref 96–112)
Creatinine, Ser: 1.6 mg/dL — ABNORMAL HIGH (ref 0.4–1.2)
Glucose, Bld: 131 mg/dL — ABNORMAL HIGH (ref 70–99)
HCT: 33 % — ABNORMAL LOW (ref 36.0–46.0)
Hemoglobin: 11.2 g/dL — ABNORMAL LOW (ref 12.0–15.0)
Potassium: 4.8 mEq/L (ref 3.5–5.1)
Sodium: 144 mEq/L (ref 135–145)
TCO2: 25 mmol/L (ref 0–100)

## 2010-05-16 LAB — BASIC METABOLIC PANEL
BUN: 32 mg/dL — ABNORMAL HIGH (ref 6–23)
CO2: 24 mEq/L (ref 19–32)
Calcium: 9.2 mg/dL (ref 8.4–10.5)
Chloride: 110 mEq/L (ref 96–112)
Creatinine, Ser: 1.41 mg/dL — ABNORMAL HIGH (ref 0.4–1.2)
GFR calc Af Amer: 47 mL/min — ABNORMAL LOW (ref >60)
GFR calc non Af Amer: 38 mL/min — ABNORMAL LOW (ref >60)
Glucose, Bld: 145 mg/dL — ABNORMAL HIGH (ref 70–99)
Potassium: 4.3 mEq/L (ref 3.5–5.1)
Sodium: 140 mEq/L (ref 135–145)

## 2010-05-16 LAB — CBC
HCT: 33.7 % — ABNORMAL LOW (ref 36.0–46.0)
Hemoglobin: 10.6 g/dL — ABNORMAL LOW (ref 12.0–15.0)
MCHC: 31.6 g/dL (ref 30.0–36.0)
MCV: 84.9 fL (ref 78.0–100.0)
Platelets: 181 10*3/uL (ref 150–400)
RBC: 3.97 MIL/uL (ref 3.87–5.11)
RDW: 16.4 % — ABNORMAL HIGH (ref 11.5–15.5)
WBC: 7.6 10*3/uL (ref 4.0–10.5)

## 2010-05-16 LAB — URINALYSIS, ROUTINE W REFLEX MICROSCOPIC
Bilirubin Urine: NEGATIVE
Glucose, UA: NEGATIVE mg/dL
Hgb urine dipstick: NEGATIVE
Ketones, ur: NEGATIVE mg/dL
Nitrite: NEGATIVE
Protein, ur: 30 mg/dL — AB
Specific Gravity, Urine: 1.009 (ref 1.005–1.030)
Urobilinogen, UA: 0.2 mg/dL (ref 0.0–1.0)
pH: 5.5 (ref 5.0–8.0)

## 2010-05-16 LAB — PROTIME-INR
INR: 1 (ref 0.00–1.49)
Prothrombin Time: 13.3 seconds (ref 11.6–15.2)

## 2010-05-16 LAB — URINE MICROSCOPIC-ADD ON

## 2010-05-16 LAB — APTT: aPTT: 33 seconds (ref 24–37)

## 2010-05-21 LAB — HEMOCCULT GUIAC POC 1CARD (OFFICE): Fecal Occult Bld: NEGATIVE

## 2010-06-05 ENCOUNTER — Ambulatory Visit
Admission: RE | Admit: 2010-06-05 | Discharge: 2010-06-05 | Disposition: A | Discharge status: Home / Self Care | Payer: Medicare Other | Source: Ambulatory Visit | Attending: Obstetrics and Gynecology | Admitting: Obstetrics and Gynecology

## 2010-06-05 DIAGNOSIS — Z1231 Encounter for screening mammogram for malignant neoplasm of breast: Secondary | ICD-10-CM

## 2010-06-14 ENCOUNTER — Ambulatory Visit (INDEPENDENT_AMBULATORY_CARE_PROVIDER_SITE_OTHER): Payer: Medicare Other | Admitting: Internal Medicine

## 2010-06-14 ENCOUNTER — Encounter: Payer: Self-pay | Admitting: Internal Medicine

## 2010-06-14 VITALS — BP 122/82 | HR 55 | Ht 70.0 in | Wt 299.0 lb

## 2010-06-14 DIAGNOSIS — G4733 Obstructive sleep apnea (adult) (pediatric): Secondary | ICD-10-CM

## 2010-06-14 DIAGNOSIS — J309 Allergic rhinitis, unspecified: Secondary | ICD-10-CM

## 2010-06-14 NOTE — Assessment & Plan Note (Signed)
Her compliance and control are quite good. I encourage weight loss.

## 2010-06-14 NOTE — Progress Notes (Signed)
  Subjective:    Patient ID: Tiffany [name], female    DOB: 08-Nov-1950, 60 y.o.   MRN: [id number]  HPI 06/14/10- 60 yoF followed for allergic rhinitis, Hx sleep apnea, complicated by obesity.  Last here May 02, 2009.  She has continued to use CPAP successfully, all night, every night at 10 cwp/ [facility]. We discussed recent study on CPAP use and health. Got through peak pollen season with modest nasal congestion and watery eyes. Claritin was sufficient this year. Never has had asthma.   Review of Systems See HPI Constitutional:   No weight loss, night sweats,  Fevers, chills, fatigue, lassitude. HEENT:   No headaches,  Difficulty swallowing,  Tooth/dental problems,  Sore throat,                No sneezing, itching, ear ache,  post nasal drip,   CV:  No chest pain,  Orthopnea, PND, swelling in lower extremities, anasarca, dizziness, palpitations  GI  No heartburn, indigestion, abdominal pain, nausea, vomiting, diarrhea, change in bowel habits, loss of appetite  Resp: No shortness of breath with exertion or at rest.  No excess mucus, no productive cough,  No non-productive cough,  No coughing up of blood.  No change in color of mucus.  No wheezing.  Skin: no rash or lesions.  GU: no dysuria, change in color of urine, no urgency or frequency.  No flank pain.  MS:  No joint pain or swelling.  No decreased range of motion.  No back pain.  Psych:  No change in mood or affect. No depression or anxiety.  No memory loss.      Objective:   Physical Exam General- Alert, Oriented, Affect-appropriate, Distress- none acute  obese  Skin- rash-none, lesions- none, excoriation- none  Lymphadenopathy- none  Head- atraumatic  Eyes- Gross vision intact,Strabismus ,   conjunctivae clear secretions  Ears- Normal- Hearing, canals, Tm Nose- Clear,  No- Septal dev, mucus, polyps, erosion, perforation   Throat- Mallampati II , mucosa clear , drainage- none, tonsils- atrophic  Neck- flexible ,  trachea midline, no stridor , thyroid nl, carotid no bruit  Chest - symmetrical excursion , unlabored     Heart/CV- RRR , no murmur , no gallop  , no rub, nl s1 s2                     - JVD- none , edema- none, stasis changes- none, varices- none     Lung- clear to P&A, wheeze- none, cough- none , dullness-none, rub- none     Chest wall-  Abd- tender-no, distended -obese , bowel sounds-present, HSM- no  Br/ Gen/ Rectal- Not done, not indicated  Extrem- cyanosis- none, clubbing, none, atrophy- none, strength- nl  Neuro- grossly intact to observation         Assessment & Plan:

## 2010-06-14 NOTE — Assessment & Plan Note (Signed)
Occasional antihistamine has been sufficient in the last few years. We reviewed expectations without changes.

## 2010-06-14 NOTE — Patient Instructions (Signed)
You are doing great with CPAP. Please keep up the good work. Please call if needed.

## 2010-06-16 ENCOUNTER — Encounter: Payer: Self-pay | Admitting: Internal Medicine

## 2010-06-19 NOTE — Assessment & Plan Note (Signed)
OFFICE VISIT   [name], [name]  DOB:  19-Nov-1950                                       06/13/2008  [id number]   REASON FOR VISIT:  Evaluation swollen leg.   REFERRING PHYSICIAN:  Barbaraann Rondo A. Alphonzo [name], M.D.   PRIMARY CARE PHYSICIAN:  Tiffany [name], M.D.   HISTORY:  This is a 60 year old female seen at the request of Dr.  Alphonzo [name] for evaluation of swollen right leg.  The patient states that  beginning on April 1 she woke up with severe pain and swelling in her  right leg.  She went to the emergency department where an ultrasound was  performed which was negative for deep venous thrombosis, superficial  thrombosis.  She would then saw Dr. Alphonzo [name] who was concerned about a  ruptured Baker's cyst and MRI was performed which showed no evidence of  a ruptured Baker's cyst just subcutaneous fluid consistent with edema,  she is referred to me for further evaluation.  The patient states that  the swelling has gone down.  However, still very bothersome for her and  right leg is significantly larger than the left.  She never had a  history of blood clot.  There is no family history of clotting disorder.  She does not recall any specific trauma to the leg but may have  accidentally bumped her leg outside prior to this event.  She denies any  varicosities.   PAST MEDICAL HISTORY:  Significant for:  1. Diabetes age of onset 39.  2. Hypertension.  3. Hypercholesterolemia.  4. Hypothyroidism.   FAMILY HISTORY:  Is positive for cardiovascular disease in her sister.   SOCIAL HISTORY:  She is single with two children.  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  Does not smoke.  Has never smoked.  Does not drink.   REVIEW OF SYSTEMS:  GENERAL:  She reports approximately 90 pound weight  loss, somewhat loss of appetite and she weighs 300 pounds.  CARDIAC:  Is positive for heart murmur.  PULMONARY:  Occasional wheezing.  GI:  Positive constipation.  GU:  Is positive for  stage III kidney disease.  VASCULAR:  Negative except for as above.  NEURO:  Is negative.  ORTHO:  Is positive for joint pain in her knee.  PSYCH:  Negative.  ENT:  Negative.  HEME:  Negative.   CURRENT MEDICATIONS:  1. Synthroid.  2. Metformin.  3. Clonidine.  4. Crestor.  5. Allopurinol.  6. Alprazolam.  7. Avalide.  8. Hydrocodone.  9. Lantus.  10.Tylenol.  11.Topiramate.  12.Bayer aspirin.  13.Klor-Con.  14.Furosemide.  15.Nexium.  16.Lotrel.   ALLERGIES:  Include SULFA drugs.   PHYSICAL EXAMINATION:  Vital signs:  Blood pressure 133/81.  General:  She is well-appearing, no distress.  HEENT:  She is normocephalic and  atraumatic.  Cardiovascular:  Regular rhythm, respirations nonlabored.  Abdomen:  Obese.  Extremities:  Reveal markedly swollen right leg.  There is sparing of the toes, she has calf tenderness.  There are no  motor or sensory deficits.   ASSESSMENT/PLAN:  Right leg swelling.   PLAN:  The patient has been worked up for deep venous thrombosis and  this has been negative.  I am concerned she could have some form of  compression.  To further evaluate this I am going to order a CT scan  of  her abdomen and pelvis to look for mass effect or potentially a right-  sided May-Thurner  syndrome.  I am also going to further evaluate her  lower extremity venous system with a reflux evaluation.  Should the  venous workup be unremarkable I would, by Tiffany [name], classify this as  lymphedema, likely secondary lymphedema from an undocumented trauma  versus lymphedema tarda.  Regardless, she would need to be placed in leg  compression which I am doing currently.  She will be put into 20-30 mm  compression stockings.  If it is lymphedema we would defer to lymphedema  specialist for treatments.  I am seeing her back in 2 weeks when her CT  scan and her venous studies have been completed.   Tiffany Abrahams, MD  Electronically Signed   VWB/MEDQ  D:  06/13/2008  T:   06/14/2008  Job:  [id number]   cc:   Tiffany [name]. Alphonzo [name], M.D.  Tiffany [name], M.D.

## 2010-06-19 NOTE — Assessment & Plan Note (Signed)
OFFICE VISIT   Tiffany [name], Tiffany [name]  DOB:  [birth year]                                       09/12/2008  [id number]   REASON FOR VISIT:  Follow up leg swelling.   HISTORY:  This is [name] 60 year old female that I saw at the request of Dr.  Alphonzo Cruise for evaluation of [name] swollen right leg, which began on April 1.  At that time [name] duplex ultrasound was performed which was negative for  deep venous thrombosis or superficial venous thrombosis.  The swelling  has gone down; however, the right leg still bothers her.  She has had no  history of thrombophilia.  When I saw her last, I felt she needed to  have [name] venous insufficiency workup as well as [name] CT scan of her abdomen  and pelvis to evaluate for mass effect or [name] right-sided May-Thurner  syndrome.  Unfortunately, her insurance company denied her CT scan, so I  cannot answer the question regarding mass effect or [name] right-sided May-  Thurner syndrome.  However, she did come in today for venous  insufficiency evaluation.  She has moderate insufficiency in both common  femoral veins and mild reflux in her proximal greater saphenous vein;  however, this is not clinically significant.  With these findings, in  addition to the fact that the patient has had significant benefit from  the compression stockings that we put her last week, I think that our  best course of action is to continue with compression therapy.  I am not  going to schedule her to come back to see me; however, if she has  further issues, she will contact me.   Eldridge Abrahams, MD  Electronically Signed   VWB/MEDQ  D:  09/12/2008  T:  09/13/2008  Job:  [id number]   cc:   Geroge Baseman. Alphonzo Cruise, M.D.  Nolene Ebbs, M.D.

## 2010-06-19 NOTE — Procedures (Signed)
DUPLEX DEEP VENOUS EXAM - LOWER EXTREMITY   INDICATION:  Right lower extremity swelling.   HISTORY:  Edema:  Minimal, less than previous.  Trauma/Surgery:  No.  Pain:  Right foot.  PE:  No.  Previous DVT:  No.  Anticoagulants:  Aspirin.  Other:   DUPLEX EXAM:                CFV   SFV   PopV   PTV   GSV                R  L  R  L  R  L   R  L  R   L  Thrombosis    0  0  0     0      0     0  Spontaneous   +  +  +     +      +     +  Phasic        +  +  +     +      +     +  Augmentation  +  +  +     +      +     +  Compressible  +  +  +     +      +     +  Competent     D  D  +     +      +     D/+   Legend:  + - yes  o - no  p - partial  D - decreased    IMPRESSION:  1. No evidence of DVT or SVT in the right lower extremity or left      common femoral vein.  2. Evidence of moderate venous insufficiency in bilateral common      femoral veins.  3. Mild reflux noted in proximal greater saphenous vein, however no      significant.  4. All other imaged vessels show no evidence of reflux.         _____________________________  V. Leia Alf, MD   AS/MEDQ  D:  09/12/2008  T:  09/12/2008  Job:  [id number]

## 2010-06-22 NOTE — Consult Note (Signed)
[facility]  Patient:    Tiffany, [name] Visit Number: [id number] MRN: [id number]          Service Type: PMG Location: [facility] Attending Physician:  Tiffany [name] Dictated by:   Tiffany [name], D.O. Proc. Date: 10/10/00 Admit Date:  10/02/2000   CC:         Tiffany [name], M.D.   Consultation Report  REFERRING Tiffany [name]:  Dr. Newman [name].  CHIEF COMPLAINT:  Back pain with pain into the buttocks and lower extremities bilaterally.  HISTORY OF PRESENT ILLNESS:  Tiffany [name] is a pleasant 60 year old right-hand dominant female who was kindly referred by Dr. Arnoldo [name] for pain management. 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  She subsequently underwent L4-5 diskectomy with L4 laminotomy in March 2002, with resolution of pain in her left posterior calf.  She now currently complains of back pain with pain into her buttocks bilaterally and occasionally into her posterior thighs, as well, bilaterally.  Her pain is described as horrible, 10/10 on a subjective scale.  This is characterized as constant, achy, throbbing, tingling, with occasional numbness in her feet. The patient states that her pain worsens with walking, and it is during walking that she notices paresthesias down into her feet.  The paresthesias are relieved with sitting.  However, she notes prolonged sitting does cause her low back and buttock pain to worsen.  Also, the pain is worsened by bending.  Her symptoms are improved with rest and medications.  The patient states that if she leans forward over a grocery cart she has some relief of her pain and lower extremity paresthesias and radicular pain symptoms.  The patients function and quality of life indices have greatly declined.  She admits to poor sleep.  She states she is getting about two hours of sleep  per night.  The patient states that she had a fall onto her buttocks which precipitated much of her symptoms over the past several months.  The patient denies bowel and bladder dysfunction.  She admits to increase in pain with Valsalva maneuver.  Health and history form and 14-point review of systems were reviewed.  PAST MEDICAL HISTORY: 1. Heart disease. 2. Lung disease. 3. Diabetes. 4. High blood pressure.  PAST SURGICAL HISTORY: 1. Breast reduction. 2. Partial hysterectomy. 3. Back surgery. 4. Toenail removal.  FAMILY HISTORY:  Denies.  SOCIAL HISTORY:  Denies smoking or alcohol use.  She is currently single.  Not currently working.  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  ALLERGIES:  No known drug allergies, although she cannot tolerate some SINUS MEDICATIONS.  MEDICATIONS:  Synthroid, Lopid, K-Dur, labetalol, Lasix, imipramine for sleep, allopurinol, Norvasc, Neurontin 300 mg q.i.d., and oxycodone 5 mg four times per day.  PHYSICAL EXAMINATION:  GENERAL:  Pleasant, obese female in no acute distress.  The patient has significant difficulty raising from sitting position to get onto the examining table secondary to pain.  BACK:  Increased lumbar lordosis with tenderness to palpation bilateral lumbar paraspinals and gluteal muscles.  Flexion is limited secondary to pain and pulling in bilateral posterior thighs.  Extension is equally painful, as is rotation and rotation plus extension.  Manual muscle testing is 5/5 bilateral lower extremities, except 4+/5 bilateral ankle dorsiflexion with giveaway weakness.  EXTREMITIES:  Sensory exam is intact to light touch at this time bilateral lower extremities.  Muscle stretch  reflexes are 1+/4 bilateral patellar, medial hamstrings, and Achilles.  Straight leg raise is negative bilaterally although it does cause some pulling sensation in the low back and buttock. This was done to 90 degrees.  Tiffany [name] was negative  bilaterally.  Pedal pulses were diminished although the patient has significant pretibial edema.  There are no rashes, lesions, or ulcerations in the skin in the lower extremities.  LABORATORY DATA:  MRI report dated September 02, 2000, revealed multilevel degenerative disk disease with central focal protrusion at L3-4 with posterior facet arthropathy resulting in mild to moderate central canal stenosis.  She also has postoperative changes at L4-5 on the left.  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  The patients symptoms are consistent with neurogenic claudication in addition to facet arthropathy, which may be contributing to the patients back pain.  Also can consider sacroiliac joint pain although physical exam is not entirely consistent with this.  PLAN: 1. Will schedule the patient for a trial of lumbar epidural steroid injections    for pain relief. 2. Discontinue imipramine for sleep as it is not helping. 3. Elavil 25 mg 1 p.o. q.h.s. #30. 4. Percocet 7.5 mg/325 mg 1 p.o. q.6h. as needed for severe pain.  The patient    was counseled on the ultimate goal of weaning off of the narcotic    medications.  Interventional procedures such as lumbar epidural steroid    injection should facilitate a lesser need for narcotic analgesia. 5. Continue Neurontin 300 mg q.i.d. 6. Follow the patient after epidural steroid injection.  The patient was educated on the above findings and recommendations and understands.  There were no barriers to communication. Dictated by:   Tiffany [name], D.O. Attending Physician:  Tiffany [name] DD:  10/10/00 TD:  10/11/00 Job: [id number]

## 2010-06-22 NOTE — Consult Note (Signed)
[facility]  Patient:    Tiffany [name], Tiffany [name] Visit Number: [id number] MRN: [id number]          Service Type: PMG Location: [facility] Attending Physician:  Cloria Spring Dictated by:   Cloria Spring, D.O. Proc. Date: 10/15/00 Admit Date:  10/02/2000   CC:         Tiffany [name], M.D.   Consultation Report  REASON FOR CONSULTATION:  [name] is a 61 year old female with degenerative spinal disease of the lumbar spine, status post L4-5 diskectomy for an L4-5 herniated disk in March of 2002, with persistent lower back pain radiating into bilateral lower extremities.  Patient was initially evaluated on October 10, 2000.  She returns to clinic today for a trial of lumbar epidural steroid injections.  Patient is a diabetic.  Health and history form and 14-point review of systems reviewed; no change from two days ago.  Examination unchanged.  IMPRESSION:  Degenerative spinal disease of the lumbar spine, status post L4-5 diskectomy, March 2002.  PLAN: 1. Lumbar epidural steroid injection.  Procedure was described in detail to    the patient and informed consent was obtained.  The patient was brought    back to the fluoroscopy suite and placed on the table in prone position.    Skin was cleansed in the usual sterile fashion.  Skin and subcutaneous    tissue were anesthetized with 3 cc of 1% lidocaine.  Under direct    fluoroscopic guidance, a 20-gauge 6-inch Tuohy needle was advanced into the    L5-S1 right paramedian epidural space with loss-of-resistance technique.    No CSF, heme or paresthesia were noted.  This was injected with 1 cc of    Depo-Medrol 40 mg/cc plus 1 cc of normal saline.  Medications were    preservative-free.  Patient tolerated procedure well.  There were no    complications. 2. Patient to continue current medications including amitriptyline for sleep. 3. Patient instructed to monitor blood sugars closely for the next 72 hours. 4.  Patient to return to clinic in one week for reevaluation and possible    second lumbar epidural steroid injection. Dictated by:   Cloria Spring, D.O. Attending Physician:  Cloria Spring DD:  10/15/00 TD:  10/15/00 Job: [id number]

## 2010-06-22 NOTE — Cardiovascular Report (Signed)
NAME:  Tiffany [name], Tiffany [name]                         ACCOUNT NO.:  [id number]   MEDICAL RECORD NO.:  [id number]                   PATIENT TYPE:  OIB   LOCATION:  2899                                 FACILITY:  [facility]   PHYSICIAN:  Sinclair Grooms, M.D.            DATE OF BIRTH:  Jan 27, 1951   DATE OF PROCEDURE:  09/14/2003  DATE OF DISCHARGE:  09/14/2003                              CARDIAC CATHETERIZATION   INDICATIONS FOR PROCEDURE:  Recent episodes of possible congestive heart  failure and recent adenosine Cardiolite study suggesting anteroapical  ischemia and an EF of 31%.  Prior studies have demonstrated a normal LV  function and no evidence of ischemia.  This study is being done to rule out  significant coronary disease in this 60 year old female with multiple risk  factors.   PROCEDURES PERFORMED:  1. Left heart catheterization.  2. Selective coronary angiography.  3. Left ventriculography.  4. Angiocele arteriotomy closure.   SURGEON:   DESCRIPTION OF PROCEDURE:  After informed consent, a 6 French sheath was  placed in the right femoral artery using the modified Seldinger technique.  A 6 French A2 multipurpose catheter was used for hemodynamic recordings,  left ventriculography by hand injection, selective left and right coronary  angiography.  Following the coronary angiogram, angiocele arteriotomy was  performed after visualization of the right femoral artery demonstrated  adequate access.  Hemostasis was adequate.   RESULTS:  A. Hemodynamic data:     a. Aortic pressure 126/70.     b. Left ventricular pressure 129/28 mmHg.  B. Left ventriculography:  The left ventricle is mildly dilated.  Overall LV     function is normal, EF is at least 50%, no obvious MR.  C. Coronary angiography.     a. Left main coronary artery:  Normal.     b. Left anterior descending coronary artery:  LAD is a large vessel that        gives origin to several small diagonal branches.  In the  distal LAD at        the beginning of the apical segment, there is a 50 to 60% LAD        narrowing.  I do not believe that there are significant obstructive        lesions in the LAD, however.     c. Circumflex artery:  Circumflex is large.  Three large obtuse marginal        branches arise from it.  These vessels are normal.     d. Right coronary artery:  The right coronary is a large dominant vessel        giving origin to a large PDA and right ventricular branches.  The        vessel was normal.   CONCLUSION:  1. Essentially normal coronaries.  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  2. Normal left ventricular systolic function with elevated left ventricular     end diastolic pressures consistent with left ventricular diastolic     dysfunction.  Left ventricular enlargement and increased left ventricular     pressures could be related to obesity and hypertension.   PLAN:  1. Encourage weight loss.  2. Aggressive blood pressure control.  3. Liberal use of diuretics and negative inotropic agents.                                               Sinclair Grooms, M.D.    HWS/MEDQ  D:  09/14/2003  T:  09/15/2003  Job:  [id number]   cc:   Nolene Ebbs, M.D.  [address]  [place]  [place] [address]  Fax: [phone]

## 2010-06-22 NOTE — Op Note (Signed)
[place]. [facility]  Patient:    Tiffany [name], Tiffany [name]                      MRN: [id number] Proc. Date: 04/09/00 Adm. Date:  [id number] Attending:  Newman Pies D                           Operative Report  BRIEF HISTORY:  The patient is a 60 year old black female who has suffered from years of back pain.  She recently has been having severe left leg pain which failed medical management when she was worked up with a lumbar MI that demonstrated a large herniated nucleus pulposus at L4-5.  I discussed the various treatment options with her and the patient weighed the risks, benefits and alternatives of surgery and decided to proceed with a L4-5 microdiskectomy.  PREOPERATIVE DIAGNOSIS:  L4-5 herniated nucleus pulposus, degenerative disease, spinal stenosis, lumbago lumbar radiculopathy.  POSTOPERATIVE DIAGNOSIS:  L4-5 herniated nucleus pulposus, degenerative disease, stenosis, lumbago lumbar radiculopathy.  PROCEDURE:  Left L4-5 microdiskectomy using microdissection.  SURGEON:  Ophelia Charter, M.D.  ASSISTANT:  Hosie Spangle, M.D.  ANESTHESIA:  General endotracheal.  ESTIMATED BLOOD LOSS:  150 cc.  SPECIMENS:  None.  DRAINS:  None.  COMPLICATIONS:  None.  DESCRIPTION OF PROCEDURE:  The patient was brought to the operating room by the anesthesia team.  General endotracheal anesthesia was induced. The patient was then turned to the prone position on the Wilson frame.  The lumbosacral region was then prepared with Betadine scrub and Betadine solution.  Sterile drapes were applied.  I then injected the area to be incised with Marcaine with epinephrine.  I obtained initial intraoperative radiograph with a spinal needle, placed along the surface of the patients skin to direct marks where I should make the incision.  I then injected the area to be incised with Marcaine with epinephrine solution and made an incision over the L4-5 interspace.  I  used electrocautery to dissect through the patients abundant adipose tissue down to the thoracolumbar fascia.  I divided the fascia just to the left of the midline and performed a left-sided subperiosteal dissection stripping the paraspinous musculature from the spinous process of the lamina of L4 and L5. I inserted the self-retaining retractor for exposure.  I then obtained another intraoperative radiograph to confirm our location.  I then brought the operating microscope into the field and under magnification illumination I completed the microdissection/decompression.  I used the high-speed drill to perform a left L5 laminotomy.  I used a Kerrison punch to widen the laminotomy as well as to complete a left L4 hemilaminectomy.  I removed the ligamentum of flavum at L4-5 and some of the ligamentum of flavum at L3-4.  This gave me a good view of the thecal sac, at L4 on the left as well as the L5 nerve root and the exiting L4 nerve root.  I then used microdissection to free up the thecal sac on the L5 nerve root and then carefully retracted medially.  This exposed a large free fragment disk herniation which appeared to have herniated from the L4-5 interspace and migrates in a cephalad direction behind the vertebral body of L4.  I freed it up using microdissection and removed the herniated disk and several fragments using a pituitary forceps.  I then followed it out laterally.  There was some more disk beneath the ventral aspect of the  L4 nerve root as it was exiting the neural foramen.  I removed it with a Kerrison punch.  I then palpated about the ventral surface of the thecal sac and the L5 and L4 nerve root and noted that they were well decompressed.  I obtained stringent hemostasis with bipolar electrocautery.  I irrigated the wound with bacitracin solution, removed the solution and then removed the self-retaining retractor and then reapproximated the patients thoracolumbar fascia  with the interrupted #1 Vicryl, the subcutaneous using interrupted 2-0 Vicryl suture and skin with Steri-Strips and benzoin.  The wound was then coated with bacitracin ointment and a sterile dressing was applied, and the drapes were removed and the patient was returned to supine position where she was extubated by the anesthesia team and transported to the PACU in stable condition.  All sponge, instrument and needle counts were correct at the end of this case. DD:  04/09/00 TD:  04/10/00 Job: [id number]

## 2010-06-22 NOTE — Consult Note (Signed)
[facility]  Patient:    Tiffany [name], Tiffany [name] Visit Number: [id number] MRN: [id number]          Service Type: PMG Location: [facility] Attending Physician:  Cloria Spring Dictated by:   Cloria Spring, D.O. Proc. Date: 11/12/00 Admit Date:  10/02/2000                            Consultation Report  HISTORY:  The patient returns to clinic today for reevaluation.  She was seen approximately two weeks ago.  She continues to have significant low back pain, stating that her pain is a 10/10 on a subjective scale today.  We have done two lumbar epidural steroid injections, with some relief after the first and no significant further relief after the second.  The third lumbar epidural steroid injection was not performed on September 26 secondary to elevated blood pressure.  Since that time, the patient has been seeing her primary care physicians to better control her blood pressure.  She continues to have pain in her buttocks and bilateral lower extremities, worse when walking that when sitting.  She continues to take Neurontin 300 mg q.i.d. and has also taken Percocet 7.5/325 mg q.6-8h. p.r.n.  The patient states that she typically takes it three and sometimes four times daily.  She has gone through #50 pills since October 23, 2000.  The patient denies new neurologic complaints.  I reviewed health and history form and 14-point review of systems.  PHYSICAL EXAMINATION:  GENERAL:  Obese female in no acute distress.  NEUROLOGIC:  No new neurologic findings, motor, sensory and reflexes.  Gait is still antalgic.  IMPRESSION: 1. 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  Status    post L4-5 diskectomy.  Persistent bilateral lower extremity radicular    symptoms consistent with neurogenic claudication. 2. Hypertension.  PLAN: 1. Will renew Percocet 7.5/500 mg, one p.o. t.i.d. p.r.n. for severe pain, #90    with no refills.  The patient again  was counseled on the use of this    medication.  She was instructed to use this sparingly and only as needed.    she understands that this will last her one month and will not be refilled    sooner.  Will consider possibly switching the patient over to a    longer-acting medication to include methadone or Duragesic. 2. The patient is to return to the clinic in two weeks for a third lumbar    epidural steroid injection.  Will monitor blood pressure prior to the    procedure. 3. The patient was instructed to resume the lumbopelvic stabilization    exercises that she was taught in physical therapy. 4. May have the patient evaluated by Dr. Carlos Levering [facility] to assist with medication management. 5. The patient was instructed to increase Neurontin to 600 mg at her bedtime    dose.  The patient was educated on the above findings and recommendations and understands.  There were no barriers to communication. Dictated by:   Cloria Spring, D.O. Attending Physician:  Cloria Spring DD:  11/12/00 TD:  11/13/00 Job: [id number]

## 2010-06-22 NOTE — Discharge Summary (Signed)
[place]. [facility]  Patient:    Tiffany [name], Tiffany [name] Visit Number: [id number] MRN: [id number]          Service Type: PMG Location: [facility] Attending Physician:  Cloria Spring Dictated by:   Elayne Snare, M.D. Admit Date:  10/02/2000 Discharge Date: 12/31/2000   CC:         Illene Labrador, M.D.                           Discharge Summary  HISTORY OF PRESENT ILLNESS:  This patient was admitted to the emergency room by the [facility] with history of shortness of breath for three days and increased swelling especially left leg.  Details are present in the chart and no dictated history is present.  MEDICATIONS:  Synthroid, Lasix, K-Dur, Lopid, Norvasc, Percocet, Elavil, Amaryl, Neurontin, Premarin and Ecotrin.  For family history, past history, review of systems, see HPI.  PHYSICAL EXAMINATION:  Blood pressure 152/80.   The patient had 2+ pitting edema.  No jugular venous distension.  The patient is obese.  No carotid bruit.  Heart sounds were normal with grade 1 ejection murmur.  Chest showed diffuse crackles bilaterally and end expiratory wheezes in upper lobe.  No other significant findings.  HOSPITAL COURSE:  The patient was admitted for treatment of pulmonary edema with diuresis.  The patients shortness of breath gradually improved and her CHF was attributed to taking extra ibuprofen after her arthroscopy and also presence of baseline renal insufficiency.  The patients chest x-ray was repeated.  She was started on iron and cardiology consultation with Illene Labrador, M.D., was done.  He ordered 2-D echo.  Avapro was started for control of her blood pressure and left ventricular dysfunction.  LABS:  CT of the chest showed no evidence of pulmonary embolism.  __________ and air space opacities were present, right greater than left. Follow-up chest x-ray showed cardiomegaly, interstitial air space opacities.  Her EKG showed normal sinus  rhythm and no abnormalities.  Labs showed normal white blood cell count, hemoglobin 9.1. Glucose 121, BUN 58, creatinine 1.5 and on discharge creatinine was 1.3.  __________.  CPK normal.  Cholesterol 278. TSH 0.3. Iron saturation 7%. Urine sodium 109.  Echocardiogram report showed normal left ventricular systolic function, ejection fraction 55 to 65%, mild increase in aortic valve thickness with gradient of 11.  Mitral annular calcification with mild mitral regurgitation. Mild pulmonic stenosis and mild left atrial dilatation present.  DISCHARGE DIAGNOSES: 1. Fluid overload and congestive heart failure with pulmonary edema. 2. Mild renal insufficiency. 3. Marked obesity. 4. Diabetes with fair control. 5. History of low back pain secondary to disc disease.  DISCHARGE CONDITION:  Improved.  DISCHARGE INSTRUCTIONS:  The patient to resume her Synthroid but take 0.3 mg one and one half tablets.  Lasix 60 mg.  Lopid, Norvasc, labetalol b.i.d., iron three times a day.  Amitriptyline Amaryl, Avandia one tablet a day. Allopurinol, Neurontin three a day.  Avapro 150 mg q.d. and Baclofen 10 mg t.i.d. as needed.  DIET:  Diet is to be low sodium.  FOLLOW-UP:  She will follow up in the office in two weeks. Dictated by:   Elayne Snare, M.D. Attending Physician:  Cloria Spring DD:  01/20/01 TD:  01/20/01 Job: [id number]

## 2010-06-22 NOTE — Discharge Summary (Signed)
NAME:  Tiffany [name], Tiffany [name]                         ACCOUNT NO.:  [id number]   MEDICAL RECORD NO.:  [id number]                   PATIENT TYPE:  NP   LOCATION:  3007                                 FACILITY:  [facility]   PHYSICIAN:  Ophelia Charter, M.D.            DATE OF BIRTH:  Nov 29, 1950   DATE OF ADMISSION:  07/23/2001  DATE OF DISCHARGE:  07/31/2001                                 DISCHARGE SUMMARY   HISTORY OF PRESENT ILLNESS:  For full details of this admission, please  refer to typed history and physical. The patient is [name] 60 year old black  female who has suffered from back pain and intractable leg pain to the point  where she could barely walked.  I have previously performed [name] left L4-5  microdiskectomy on her, she did fairly well from that surgery but has had  worsening intractable back and leg pain since then.  I worked her up with [name]  lumbar MRI and [name] lumbar myelogram/CT which demonstrated that at L3-4 she has  [name] significant mobile spondylolisthesis with severe degenerative disk disease  and stenosis.  I discussed the various treatment options with her including  surgery; the patient weighed the risks, benefits and alternatives to surgery  and decided to proceed with the operation. For further details of the  admission, refer to that history and physical.   HOSPITAL COURSE:  I admitted the patient to [facility] on July 23, 2001.  On the day of admission, I performed an L3-4  PLIF and pedicle screw  fusion.  Surgery went well without complications (for full details of the  operation, please refer to the typed operative note).   The patient's postoperative course was as follows:  She initially did well  but then developed [name] severe postoperative ileus.  She also developed some  epigastric pain and we therefore had pulmonary medicine care doctors see the  patient and they made some suggestions.  The patient ruled out for  myocardial infarction.  The patient's ileus was  initially treated with an NG  tube but this was removed and she refused to have it put back in.  We  requested [name] GI consult from the [facility].  We had the PM&R doctors see the patient and they felt she was appropriate  for inpatient rehabilitation and by August 30, 2001, she was transferred to  rehab.   FINAL DIAGNOSES:  1. Left L3-4 acquired grade 1 spondylolisthesis, degenerative disk disease,     spinal stenosis, lumbar radiculopathy and lumbago.  2. Postoperative ileus.  3. Diabetes mellitus.  4. Morbid obesity.  5. Hypertension.  6. Anemia.  7. Diabetes.  8. Hypothyroidism.  9. Gout.   PROCEDURE PERFORMED:  L3 Gill procedure, L3-4 posterior lumbar interbody  fusion; placement of bilateral L3-4 tension bone dowels; posterior  nonsegmental instrumentation with C-D rods, pins,  pedicles screws and rods  at L3-4; posterolateral arthrodesis using  local morcelized  autograft bone  and BPAS bone scaffolding.    DISCHARGE INSTRUCTIONS:  The patient is instructed to follow up with me in  four weeks after her discharge from the rehab unit and given written  discharge instructions.                                               Ophelia Charter, M.D.    JDJ/MEDQ  D:  09/10/2001  T:  09/15/2001  Job:  [id number]

## 2010-06-22 NOTE — Discharge Summary (Signed)
NAME:  Tiffany [name], Tiffany [name]                         ACCOUNT NO.:  [id number]   MEDICAL RECORD NO.:  [id number]                   PATIENT TYPE:  IPS   LOCATION:  4011                                 FACILITY:  [facility]   PHYSICIAN:  Cherly Anderson. Theda Sers, M.D.             DATE OF BIRTH:  12/10/1950   DATE OF ADMISSION:  07/31/2001  DATE OF DISCHARGE:  08/07/2001                                 DISCHARGE SUMMARY   DISCHARGE DIAGNOSES:  1. L3-L4 degenerative disc disease, spondylolisthesis and stenosis status     post posterior lumbar interbody fusion.  2. Status post ileus.  3. History of diabetes mellitus.  4. History of hypertension.  5. History of hypothyroidism.  6. History of neuropathy.  7. Anemia.   HISTORY OF PRESENT ILLNESS:  The patient is [name] 60 year old black female with  [name] past history of morbid obesity, hypertension, and diabetes mellitus  admitted on June 59, 2003, for posterior lumbar interbody fusion due to L3-  L4 degenerative disc disease, spondylolisthesis and stenosis by Dr. Newman Pies.  The patient was started on Lovenox for deep venous thrombosis  prophylaxis.  Physical therapy report indicates the patient is ambulating  with close supervision up to 150 feet with [name] wide rolling walker.  Could  transfer, could stand, minimal assist and general back precautions.  Postoperative complications include ileus of the small bowel.  Gastroenterology was consulted status post nasogastric tube and ileus  resolved on July 30, 2001.  The patient is now on [name] liquid diet to advance  as tolerated.  The patient was transferred to [facility] inpatient  rehabilitation on July 31, 2001.   PAST MEDICAL HISTORY:  Significant for above plus gout and thyroid disease.   PAST SURGICAL HISTORY:  Significant for total abdominal hysterectomy, breast  reduction, left and right knee arthroscopy, and laminectomy.   SUBJECTIVE:  SULFA, VIOXX and LACTOSE INTOLERANT.   SOCIAL HISTORY:  The  patient lives in [name] one-level home, alone in [place],  7-8 steps, independent prior to admission.  Denies tobacco or alcohol use.  Niece may be able to assist at the time of discharge.  She has two children,  10 and 4.  She is currently on disability.  She was walking with [name] cane  prior to admission.  Primary care physician is Dr. Nolene Ebbs.   FAMILY HISTORY:  Noncontributory.   REVIEW OF SYSTEMS:  Significant for anemia and weakness.   HOSPITAL COURSE:  The patient was admitted to [facility] on July 31, 2001, for comprehensive inpatient rehabilitation  where she received more than three hours of physical therapy and  occupational therapy daily.  Her hospital course was significant for [name]  urinary tract infection and elevated CBTs.  The patient remained on Lovenox  30 mg subcu q.12h. for deep venous thrombosis prophylaxis.  At the time of  admission the patient was placed on Reglan  10 mg Reglan which was eventually  tapered off.  The ileus had resolved.  She had [name] repeat abdominal x-ray  performed on July 27, 2001, which was unremarkable and demonstrated  unremarkable abdomen.  The patient demonstrated no nausea or vomiting while  in rehabilitation.  The patient at the time of admission the patient was  presently on Lantus 40 units q.h.s. and Starlix was placed on hold due to  liquid diet.  It was noted once the patient began to have [name] normal diet her  sugars began to be elevated.  Therefore, Starlix was restarted at 120 mg  t.i.d. on August 04, 2001.  On August 01, 2001, the patient began to complain of  dysuria.  She was started on amoxicillin 250 mg t.i.d.  She received  Pyridium 100 mg b.i.d. x2 days for dysuria.  After the addition of this  medication dysuria did improve.  She remained on Neurontin and Vicodin as  pain control and she resumed her Synthroid 30 mg p.o. t.i.d. for  hypothyroidism on July 31, 2001.  There were no other major medical issues   while in rehabilitation.  The patient's blood pressure remained fairly in  stable condition on Norvasc with clonidine.  Lasix was placed on hold until  the patient was increased p.o. and was not resumed at the time of discharge.   Laboratory indicated that the patient's hemoglobin was 8.8.  Her latest  white blood cell count was 12.4, platelet count 278, hematocrit 27.8, sodium  137, potassium 3.7, glucose 107, BUN 10, creatinine 1.0, AST was 57, ALT was  45.   At the time of discharge blood pressure was 140/80, respiratory rate was 20,  pulse was 84, temperature was 98.8, CBGs have gone from 129 to 258 to 176.  Physical therapy report indicates the patient was ambulating approximately  modified independent 300 feet with wide rolling walker, could transfer from  sit to stand independently, and bed mobility independently, which she  performed with ADS supervision modified independently.  The patient is made  modified independently with all mobility.  She was very motivated and made  excellent safety awareness and is ready for discharge home.  The patient  progressed towards long-term goals.   DISCHARGE MEDICATIONS:  1. Norvasc 10 mg daily.  2. Synthroid 300 mg daily.  3. Neurontin 300 mg three times daily.  4. Starlix 120 mg three times daily.  5. Lantus 40 units at night.  6. Clonidine 0.2 mg twice daily.  7. Vicodin 5/500 1-2 tablets every 4 to 6 hours as needed for pain.  8. Do not take Glucophage for now.  9. Imipramine 50 mg two tablets at night.   She is to wear her back brace when sitting up and don and doff back brace in  bed, resume aspirin as prior to admission, no drinking, no drinking, low  carbohydrate diet, restrictions include low carbohydrate, low salt, check  CBGs at least twice daily and record results, she will have home health  physical therapy, occupational therapy, R.N. by [facility]. Followup with Dr. Newman Pies within two weeks, follow up  with her  primary care Tuck Dulworth, Dr. Jeanie Cooks in [facility] within four weeks and  check sugars.  Follow up with Dr. Jarvis Morgan as needed.     Zadyn Yardley DICTATOR                          Daniel L. Theda Sers, M.D.  DD/MEDQ  D:  09/07/2001  T:  09/11/2001  Job:  [id number]   cc:   Ophelia Charter, M.D.   Daniel L. Theda Sers, M.D.

## 2010-06-22 NOTE — H&P (Signed)
[place]. [facility]  Patient:    Tiffany [name], Tiffany [name] Visit Number: [id number] MRN: [id number]          Service Type: MED Location: [id number] Attending Physician:  Philis Fendt Dictated by:   Tarry Kos Jeanie Cooks, M.D. Admit Date:  05/27/2001 Discharge Date: 05/31/2001                           History and Physical  DATE OF BIRTH:  [birth year]  PRESENTING COMPLAINTS:  1. High blood sugars.  2. Generally feeling unwell.  HISTORY OF PRESENTING COMPLAINTS:  Ms. Tiffany [name] is a 60 year old African-American lady who was seen at the office of [place] on May 26, 2001.  She presented with complaints of a few days of generally feeling unwell and unable to control her blood sugars.  Her sugars have ranged over the last two weeks between 400 and 500.  She says she has not changed her diet and has been taking her medications as prescribed.  She denied any dysuria or hematuria but has had more frequent urination as well as increased thirst.  She has had no cough or sputum production.  She had no nausea, vomiting, abdominal pain, constipation, or diarrhea.  PAST MEDICAL HISTORY:  1. Type 2 diabetes mellitus.  2. Hypertension.  3. Hypothyroidism.  4. Hyperlipidemia.  5. Morbid obesity.  6. History of gouty arthritis.  7. History of chronic back pain.  PAST SURGICAL HISTORY:  1. Back surgery performed in 2001 for prolapsed disk.  She is about to     return for a repeat procedure in a few weeks.  2. Meniscectomy of the left knee performed March 2002.  MEDICATIONS TAKEN:  1. Albuterol MDI two puffs p.r.n.  2. Premarin 0.625 mg one q.d.  3. Zocor 80 mg one q.d.  4. Enteric-coated aspirin 325 mg one q.d.  5. Avapro 150 mg q.d.  6. Norvasc 10 mg one q.d.  7. Potassium chloride 20 mEq one q.d.  8. Imipramine 50 mg two q.h.s.  9. Neurontin 600 mg one b.i.d. 10. Furosemide 40 mg two b.i.d. 11. Darvocet-N 100 one tablet q.6h. p.r.n.  for back pain. 12. Baclofen 10 mg one p.o. t.i.d. 13. Allopurinol 100 mg two p.o. q.d. 14. Centrum one tablet p.o. q.d. 15. Feosol 65 mg one p.o. t.i.d. 16. Albuterol nebulizers one unidose q.6h. p.r.n. 17. Clonidine 0.2 mg p.o. t.i.d. 18. Xenical 120 mg one tablet p.o. t.i.d. 19. Synthroid 0.2 mg p.o. q.d. 20. Benecol 20 mg p.o. q.d. 21. Amaryl 4 mg one p.o. t.i.d. a.c. 22. Glucophage 500 mg one p.o. b.i.d.  ALLERGIES:  No known drug allergies.  FAMILY AND SOCIAL HISTORY:  Ms. Stegner currently lives with her children.  She is not married and she is a single parent.  She denies any current use of alcohol, tobacco, or illicit drugs.  She is currently unemployed.  REVIEW OF SYSTEMS:  GENERAL:  She does have some fatigue and weakness but denies any fevers, chills, or sweating.  SKIN:  She has no rash, lumps, or pruritus.  CARDIAC:  She has no exertional chest pain, exertional dyspnea, palpitations, orthopnea, PND, or peripheral edema.  PULMONARY:  She has no cough, sputum production, hemoptysis, or wheezing.  GI:  She denies dysphagia, abdominal pain, heartburn, nausea, vomiting, diarrhea, constipation, rectal bleeding.  MUSCULOSKELETAL:  She still has some chronic lower back pain which is relieved by Darvocet.  She  has no radiation of pain to her legs.  She denies any paresthesias or numbness.  CNS:  She has no headaches, dizziness, blurring of vision, slurring of speech, or weakness of extremities.  PHYSICAL EXAMINATION IN THE OFFICE:  VITAL SIGNS:  Blood pressure 146/94. She weighs in at 341 pounds with a height of 5 feet 10 inches.  Heart rate 72.  Respiratory rate 16.  Body mass index 48.9.  Temperature 98.1.  Finger-stick glucose in the office was 378.  GENERAL:  She is obese but well appearing and well groomed.  She is not in any acute painful or respiratory distress.  HEENT:  Normocephalic and atraumatic.  Pupils are equal and reactive to light and accommodation.  She is  not pale.  She is afebrile.  Mucosa of the mouth is pink and moist.  She has no oral lesions.  NECK:  Shows no elevated JVD, no cervical lymphadenopathy.  Carotid pulses are 2+ bilaterally with no bruit.  There are no thyroid masses or tenderness.  HEART:  Normal S1 and S2.  No murmurs or gallops.  LUNGS:  No accessory muscle use.  Clear to auscultation and percussion.  ABDOMEN:  Obese, soft, nontender.  No masses.  Bowel sounds are present.  No hepatosplenomegaly.  EXTREMITIES:  Show no edema or ulcerations.  She has no calf tenderness or swelling.  There are no varicose veins or venous stasis.  CENTRAL NERVOUS SYSTEM:  She is alert and oriented x3 with no focal neurological deficits.  LABORATORY DATA:  EKG performed in the office showed a normal sinus rhythm at 74 beats per minute.  PR interval is 0.19, QRS 0.09, T waves are normal with poor R wave progression.  There are no acute ST-T wave changes.  ASSESSMENT:  Ms. Tiffany [name] is a 60 year old African-American lady with multiple medical problems and on polypharmacy.  She presents to the office today with highly elevated blood sugar which has been persistent over the last few days.  She is unable to control this with oral hypoglycemic agents at home, and because of the risk of complications and her multiple comorbid conditions, she is being admitted to [facility] for close monitoring and control of blood sugar and gentle hydration.  ADMITTING DIAGNOSES: 1. Severe hyperglycemia. 2. Uncontrolled diabetes mellitus type 2. 3. Uncontrolled hypertension.  PLAN OF CARE:  Patient is being admitted to a medical bed without telemetry. She will be placed on a 2 g sodium, 1800 kilocalorie ADA diet.  She can ambulate as tolerated.  Vital signs will be checked q.4h.  CBGs will be checked q.a.c. and h.s.  Further laboratory studies will include CBC, CMET, acetone, and ABG.  Will continue her current home medications  except allopurinol and Xenical and multivitamins - they will be put on hold.  She  will be placed on Glucophage 1000 mg p.o. b.i.d., insulin sliding scale with regular insulin according to intermittent protocol.  Patients condition will be monitored on the above stated plan and medications adjusted according to her response and results of further investigations.  This plan of care has been explained to the patient and accepted by her and all her questions have been answered.  ADDENDUM:  Patient was admitted directly from the office on May 26, 2001 to present to the hospital admissions office but the patient went home and stated that she could not get a baby-sitter for her Estelle kids.  Hence, she came to the hospital today on May 27, 2001. Dictated by:  Edwin A. Jeanie Cooks, M.D. Attending Physician:  Philis Fendt DD:  05/27/01 TD:  05/28/01 Job: [id number]

## 2010-06-22 NOTE — Discharge Summary (Signed)
[place]. [facility]  Patient:    Tiffany [name], Tiffany [name] Visit Number: [id number] MRN: [id number]          Service Type: MED Location: [id number] Attending Physician:  Philis Fendt Dictated by:   Tarry Kos Jeanie Cooks, M.D. Admit Date:  05/27/2001 Discharge Date: 05/31/2001                             Discharge Summary  ADMITTING COMPLAINTS 1. Generally feeling unwell. 2. Elevated blood sugars.  ADMISSION DIAGNOSES 1. Severe hyperglycemia. 2. Uncontrolled type 2 diabetes mellitus. 3. Uncontrolled hypertension.  HISTORY OF PRESENT ILLNESS: The patient is a 60 year old African-American lady who was admitted from the office of [facility] on May 27, 2001, where she had been seen the day prior to this presentation with complaints of generally feeling unwell and unable to control her blood sugars. Her blood sugars have been elevated in the range of 400 to 500. She claimed to be very compliant with her medications and her dietary restrictions. She did not have any signs and symptoms suggestive of urinary tract infection or upper respiratory infection. She had no nausea or vomiting, abdominal pain or diarrhea.  On evaluation in the office the patient was noted to be mildly dehydrated. Her blood pressure was 146/94. Fingerstick glucose was 378 in the office. She was therefore admitted to the hospital for intravenous fluids and review of her hypoglycemic agents.  HOSPITAL COURSE: On admission the patient was initially started on a 2 gm sodium 1800 calorie ADA diet. She received intravenous fluids with normal saline for hydration and medications were adjusted as follows: Glucophage was increased from 500 to 1000 mg b.i.d. She was started on Starlix 120 mg t.i.d. a.c. and Avapro was increased from 150 to 300 mg q.d. She was placed on sliding-scale Regular Insulin per intermediate protocol and Accu-Cheks were performed q.a.c. and q.h.s. Initial  laboratory studies showed white cell count of 7.2, hemoglobin 12, hematocrit 37.1 and platelet count of 164. Her sodium was 133, potassium 4.1, glucose was 495, BUN 18, creatinine 1.2. She had slightly elevated transaminases with AST of 56  and ALT 41, alkaline phosphatase 130. Her thyroid profile showed a normal TSH and T3 with mildly elevated T4 up to 13. Fasting lipid profile showed triglycerides of 271, cholesterol 170, LDL 69 and HDL of 47.  A diabetic teaching consult was requested to help with management of her diet. It was presented and appreciated. The patient was started on Actos 30 mg q.d. and Zocor was put on hold due to the mildly elevated transaminases. Due to her persistent elevation of her blood sugar, she was also started on Precose 25 mg t.i.d. with meals while Glucotrol was discontinued. On presentation the patient had some blurring of vision and this had improved during the course of admission, as her sugars began to improve.  On the day of discharge the patients fasting CBG was 156. She was well hydrated and afebrile. Her chest was clear to auscultation. Her abdomen was nontender with no masses, bowel sounds were present. Blood pressure was within normal limits.  DISCHARGE LABORATORY DATA: Potassium 4.4, BUN 28, creatinine 1.4. Her AST had increased from 56 to 132 and ALT 41 to 60. The patient was eager to go home.  DISCHARGE MEDICATIONS  1. Premarin 0.625 mg q.d.  2. Enteric coated aspirin 325 mg q.d.  3. Allopurinol 100 mg q.d.  4. Amlodipine 10  mg q.d.  5. Potassium chloride 20 mEq q.d.  6. Lasix was increased to 80 mg q.d.  7. Neurontin 600 mg b.i.d.  8. Multivitamins 1 p.o. q.d.  9. Zocor was put on hold. 10. Clonidine 0.2 mg b.i.d. 11. Synthroid 0.2 mg q.d. 12. Glucophage 1000 mg b.i.d. 13. Starlix 120 mg t.i.d. a.c. 14. Ferrous sulfate 160 mg 1 p.o. t.i.d. 15. Actos 30 mg q.d. 16. Precose 25 mg p.o. t.i.d. a.c. 17. Darvocet N-100 1 p.o. q.i.d.  p.r.n.  DISCHARGE INSTRUCTIONS: The patient was advised to continue with dietary restrictions at home. She was scheduled for an ophthalmology evaluation for recurrent blurred vision and to follow up with [facility] on June 03, 2001, to check her liver enzymes.  DISCHARGE DIAGNOSES 1. Uncontrolled diabetes mellitus, now improved. 2. Uncontrolled hypertension, now well controlled. 3. Severe hyperglycemia, now resolved.  FOLLOWUP: Followup will be as stated above. Dictated by:   Tarry Kos Jeanie Cooks, M.D. Attending Physician:  Philis Fendt DD:  07/07/01 TD:  07/09/01 Job: [id number]

## 2010-06-22 NOTE — Op Note (Signed)
[place]. [facility]  Patient:    Tiffany [name], Tiffany [name] Visit Number: [id number] MRN: [id number]          Service Type: SUR Location: [id number] Attending Physician:  Ophelia Charter Dictated by:   Ophelia Charter, M.D. Proc. Date: 07/23/01 Admit Date:  07/23/2001                             Operative Report  PREOPERATIVE DIAGNOSES:  L3-4 acquired grade 1 spondylolisthesis, degenerative disk disease, spinal stenosis, lumbar radiculopathy, lumbago.  POSTOPERATIVE DIAGNOSES:   L3-4 acquired grade 1 spondylolisthesis, degenerative disk disease, spinal stenosis, lumbar radiculopathy, lumbago.  PROCEDURES: 1. L3 Gill procedure. 2. L3-4 posterior lumbar interbody fusion. 3. Placement of bilateral L3-4 Tangent bone dowels (8 x 26 mm). 4. Posterior nonsegmental instrumentation with CD horizon M10 pedicle screws    and rods, L3-4. 5. Posterolateral arthrodesis using local morcellized autograft bone and    Vitoss bone scaffolding.  SURGEON:  Ophelia Charter, M.D.  ASSISTANT:  Zigmund Daniel. Joya Salm, M.D.  ANESTHESIA:  General endotracheal.  ESTIMATED BLOOD LOSS:  400 cc.  SPECIMENS:  None.  DRAINS:  None.  COMPLICATIONS:  None.  BRIEF HISTORY:  The patient is a 60 year old black female who has suffered from back, intractable leg pain to the point where she could barely walk.  I had previously performed a left L4-5 microdiskectomy on her.  She did fairly well from that surgery but has had worsening intractable back and leg pain.  I worked her up with a lumbar MRI and lumbar myelo-CT, which demonstrated that at L3-4 she had a significant mobile spondylolisthesis with severe degenerative disk disease and stenosis.  I discussed the various treatment options with her, including surgery.  The patient weighed the risks, benefits, and alternatives of surgery and decided to proceed with the operation.  DESCRIPTION OF PROCEDURE:  The patient was brought to  the operating room by the anesthesia team.  General endotracheal anesthesia was induced.  The patient was then turned to the prone position on the chest rolls.  Her lumbosacral region was then prepared with Betadine scrub and Betadine solution, and sterile drapes were applied.  I then injected the area to be incised with Marcaine with epinephrine solution.  I used a scalpel to make a linear midline incision over the L3-4 interspace through her previous surgical scar.  I extended the incision both in a cephalad and a caudal direction.  I used the electrocautery to dissect through the patients abundant adipose tissue down to the thoracolumbar fascia and divide the fascia bilaterally, performing a bilateral subperiosteal dissection.  I stripped the paraspinous musculature from the bilateral spinous processes and laminae of L3 and L4.  I inserted the Versa-Trac retractor for exposure.  I then obtained the intraoperative radiograph to confirm my location.  I then used a high-speed drill to perform bilateral L3 laminotomies.  I then used the Kerrison punch to complete the L3 laminectomy and remove the L3-4 and L2-3 ligamentum flavum, decompressing the thecal sac.  I performed a foraminotomy about the bilateral L4 and L3 nerve roots, decompressing those nerve roots.  I then freed up the thecal sac and the nerve roots and gently retracted medially and used a 15 blade scalpel as well as the Epstein and Scoville curettes to perform an aggressive bilateral microdiskectomy, aggressively scraping the vertebral end plates in preparation for the posterior lumbar interbody fusion.  I then  used the cutting curette and the chisel to prepare the vertebral end plates at [id number] for placement of 8 x 26 mm bone dowels bilaterally, obviously after carefully retracting the neural structures out of harms way.  There was a good, snug fit of bone graft on both sides.  I then packed in between and lateral to the bone  grafts with a combination of local morcellized autograft bone and Vitoss bone scaffolding.  I completed the posterior lumbar interbody fusion.  I now turned my attention to the posterior segmental instrumentation.  I used electrocautery to expose the bilateral transverse processes at L3 and L4 and then under fluoroscopic guidance, I decorticated posterior to the bilateral L3 and L4 pedicles and then cannulated with pedicle probes and tapped them with the tap and then palpated in the tapped pedicle with a ball probe and made sure it was in good position and then placed a 6.5 x 45 mm pedicle screw bilaterally at L3 and L4.  I then palpated along the medial surface of the pedicle using the coronary dilator, and the cortex was not breached and the bilateral L3 and L4 nerve roots were not harmed.  We then placed the rods in place and connected them and secured them with the appropriate caps, and then we placed a cross-connector to connect the two rods.  This was a good, firm construct and looked good in vivo and on fluoroscopy.  We now turned our attention to the posterolateral arthrodesis.  The L3 pars region, the L3-4 facet, and the bilateral transverse processes at L3 and L4 were decorticated using the high-speed drill, and then we laid a combination of local morcellized autograft bone and Vitoss bone substitute over the decorticated posterolateral structures to complete the posterolateral arthrodesis.  We then achieved stringent hemostasis using bipolar electrocautery and Gelfoam.  I inspected the thecal sac and the bilateral L3 and L4 nerve roots and noted they were well-decompressed.  The retractor was then removed and then the patients thoracolumbar fascia was reapproximated using interrupted #1 Vicryl suture, the subcutaneous tissues with interrupted 2-0 Vicryl suture, and the skin with Steri-Strips and benzoin.  The wound was then coated with bacitracin ointment, a sterile dressing  applied, the drapes were removed.  The patient was subsequently returned to the supine position, where she was extubated by the anesthesia team and transported to the  postanesthesia care unit in stable condition.  All sponge, instrument, and needle counts were correct at the end of this case. Dictated by:   Ophelia Charter, M.D. Attending Physician:  Newman Pies D DD:  07/23/01 TD:  07/25/01 Job: [id number]

## 2010-06-22 NOTE — Consult Note (Signed)
[facility]  Patient:    Tiffany [name], Tiffany [name] Visit Number: [id number] MRN: [id number]          Service Type: PMG Location: [facility]PC Attending Physician:  Cloria Spring Dictated by:   Cloria Spring, D.O. Proc. Date: 10/30/00 Admit Date:  10/02/2000   CC:         Ophelia Charter, M.D.   Consultation Report  FOLLOWUP EVALUATION:  Tiffany [name] returns to clinic today for reevaluation and possible third lumbar epidural steroid injection.  Patient has had her second lumbar epidural steroid injection on October 23, 2000 and states that she did not get any significant relief with the second shot.  She had received fairly good relief of lower extremity discomfort after the first shot but still has considerable low back pain.  Her current pain level is a 9/10 on a subjective scale.  She seems very concerned about what our next steps will be if the third epidural steroid injection does not help.  We discussed further options to help her to include assistive devices which would decrease her symptoms of neurogenic claudication and possibly her back pain.  Patient continues to take Percocet 7.5/325 mg two to three times daily.  She is also concerned about her use of this medication and ultimately wants to be weaned from the narcotic medication but she said it helps her pain.  She has tried to take less of the Percocet by spreading out her doses but then she develops breakthrough pain.  PHYSICAL EXAMINATION:   Physical examination is unchanged from last week. Patients blood pressure today is 225/118.  This was taken two more times over the next 30 minutes with blood pressure of 220/102 and 111/102.  IMPRESSION: 1. Degenerative disk disease of the lumbar spine, status post L4-5 diskectomy    in March 2002, with bilateral lower extremity radicular symptoms consistent    with neurogenic claudication and probable dynamic component of spinal    stenosis. 2. Hypertension,  questionable control.  PLAN: 1. Will defer third lumbar epidural steroid injection at this time secondary    to high blood pressure greater than [id number] systolic. 2. Patient instructed to follow up with primary care Tiffany [name] to address    hypertension. 3. Patient to return to clinic after blood pressure under control for    reevaluation. 4. Patient instructed to continue taking Percocet as prescribed in the    interim. 5. Will consider assistive devices such as a four-wheel multi-breaking walker.    This was simulate a shopping cart which patient uses at the grocery store    and finds that leaning forward over the cart decreases her back pain and    lower extremity symptoms consistent with neurogenic claudication.  Patient was educated on the above findings and recommendations and understands.  There were no barriers to communication. Dictated by:   Cloria Spring, D.O. Attending Physician:  Cloria Spring DD:  10/30/00 TD:  10/30/00 Job: [id number]

## 2010-06-22 NOTE — Assessment & Plan Note (Signed)
[place]                             PULMONARY OFFICE NOTE   NAME:Rody, JENIELLE MATUSZAK                      MRN:          [id number]  DATE:03/07/2006                            DOB:          Jul 06, 1950    PROBLEMS:  1. Obstructive sleep apnea/hypersomnolent/insomnia.  2. Obesity.  3. Allergic rhinitis.   HISTORY:  One year followup.  She says occasionally the CPAP seems to  blow too much air, currently set at 12 CWP.  She is using her Flonase  only p.r.n., and I explained that would not be adequate to really  benefit from that drug.  She liked Astelin better because she saw more  immediate result.   MEDICATIONS:  1. CPAP at 12 CWP.  2. Flonase.  3. Synthroid 75 mcg 3 daily.  4. Insulin.  5. Metformin 1000 mg b.i.d.  6. Iron 65 mg.  7. Torsemide 20 mg x2 p.r.n.  8. Clonidine 0.2 mg t.i.d.  9. Crestor 10 mg.  10.Ecotrin 500 mg.  11.Allopurinol 100 mg x2 at h.s.  12.Potassium 20 mEq.  13.Ambien 10 mg.  14.Alprazolam 0.25 mg b.i.d. p.r.n.  15.Avalide 300/25, 1 daily.  16.Lotrel.  17.Hydrocodone/APAP 5/500 p.r.n.  18.Astelin.   ALLERGIES:  No medication allergy.   OBJECTIVE:  GENERAL:  Quite obese at 348 pounds.  VITAL SIGNS:  BP 142/46, pulse regular 84, room air saturation 100%.  She seems alert and comfortable with no pressure marks on her face.  An  external squint is again noted.  Nasal airway is unobstructed.  LUNGS:  Clear.  HEART:  Sounds regular without murmur.  There is no edema.   IMPRESSION:  Obstructive sleep apnea.  We may be able to get the mask  more comfortable.  Weight loss is important.  We have reviewed issues of  sleep hygiene again and discussed available interventions.   PLAN:  1. [facility] is being asked to reduce her CPAP pressure to 11 CWP.  She      will call them and has her prescription.  2. I have given her a prescription for Astelin to continue once each      nostril up to b.i.d. p.r.n. for occasional  use.  3. Weight loss.  4. Scheduled to return in 1 year, earlier p.r.n.     Clinton D. Annamaria Boots, MD, [facility]ade Flood, FACP  Electronically Signed    CDY/MedQ  DD: 03/08/2006  DT: 03/08/2006  Job #: [id number]   cc:   Nolene Ebbs, M.D.

## 2010-06-22 NOTE — H&P (Signed)
[place]. [facility]  Patient:    Tiffany [name], Tiffany [name]                      MRN: [id number] Adm. Date:  [id number] Attending:  Newman Pies D                         History and Physical  CHIEF COMPLAINT: Left leg pain.  HISTORY OF PRESENT ILLNESS: The patient is a 60 year old black female who has had some trouble with her back in the past and began having increasing pain in November 2000.  She failed medical management and was worked up with a lumbar MRI.  This demonstrated a herniated nucleus pulposus and she was kindly sent for my consultation.  The patient complains primarily of left hip and buttocks pain, with pain radiating down the left leg.  It has not improved despite medical management. She describes pain radiating down the posterior aspect of her her left leg toward her left knee anteriorly, consistent with left L4 radiculopathy.  PAST MEDICAL HISTORY:  1. Hypertension.  2. Hypothyroidism.  3. Diabetes mellitus.  4. Hypercholesterolemia.  5. Heart murmur.  6. Chronic sinus trouble.  7. Anemia.  8. Benign breast lump.  9. Anal fistula. 10. Left knee osteoarthritis.  PAST SURGICAL HISTORY:  1. Fistulectomy in 1978.  2. Breast reduction in 1980.  3. Partial hysterectomy in 1992.  4. Left knee surgery in 1990.  5. Foot surgery in 1993.  6. Benign breast biopsy, year unknown.  CURRENT MEDICATIONS:  1. Ultracet 1 p.o. q.4h p.r.n. pain.  2. Skelaxin 400 mg 1-2 p.o. t.i.d.  3. Synthroid 350 mcg p.o. q.a.m.  4. Hyzaar 50/12.5 mg 1 p.o. b.i.d.  5. Labetalol 300 mg p.o. b.i.d.  6. K-Dur 20 mg p.o. q.d.  7. Imipramine 50 mg p.o. h.s. p.r.n.  8. Premarin 0.625 mg p.o. q.a.m.  9. Allopurinol 200 mg p.o. h.s. 10. Lasix 40 mg p.o. q.d. p.r.n. 11. Norvasc 10 mg p.o. q.d. 12. Amaryl 4 mg p.o. b.i.d. 13. Actos 15 mg p.o. every other day.  ALLERGIES:  1. SULFA causes nausea.  2. VIOXX causes fluid retention.  FAMILY HISTORY: The patients  mother died at the age of 61 with congestive heart failure, she also had arthritis.  The patients father died at the age of 60 and had prostate cancer.  SOCIAL HISTORY: The patient is single.  She has one adopted child and one child she is the guardian of.  She lives in [place], [place].  She denies tobacco, ethanol, or drug use.  She is employed as a Scientist, physiological.  REVIEW OF SYSTEMS: Negative except as above.  PHYSICAL EXAMINATION:  GENERAL: This patient is a pleasant, morbidly obese 60 year old black female. She walks with an antalgic gait and is complaining of left leg pain.  HEENT: Head normocephalic, atraumatic.  PERRL.  EOMI except that she does have a disconjugate gaze.  Oropharynx benign.  Uvula midline.  NECK: Supple and normal, obese.  THORAX: Symmetric.  LUNGS: Clear to auscultation.  HEART: Regular rate and rhythm.  ABDOMEN: Soft, nontender.  EXTREMITIES: Obese.  BACK: Morbidly obese.  She has no obvious deformities.  Straight leg raise testing and fabere testing are negative bilaterally.  NEUROLOGIC: The patient is alert and oriented x 3.  Cranial nerves 2-12 grossly intact bilaterally.  Vision and hearing are grossly normal bilaterally.  (She does have the above-mentioned disconjugate gaze/strabismus but  cerebral examination is intact to rapid alternating movements of the upper extremities bilaterally.  Sensory examination demonstrates decreased light touch sensation in the left lateral thigh, otherwise normal.  Deep tendon reflexes are trace to 1/4 in the bilateral gastrocnemius, 1/4 in bilateral brachial radialis; biceps, triceps, quadriceps reflexes are quite difficult to assess as she has quite large extremities.  IMAGING STUDIES: A lumbar MRI performed on February 28, 2000 at [facility] demonstrates degenerative disease at L3-4 and a herniated nucleus pulposus behind the L4 vertebral body as well as degenerative disease at  L4-5.  ASSESSMENT/PLAN: L4-5 herniated nucleus pulposus, with degenerative disease, spinal stenosis, and lumbar radiculopathy.  I have discussed the situation with the patient and reviewed the MRI scan with her, pointing out the abnormalities.  She has a large disk herniation on the left at L4 and her signs and symptoms and physical examination are consistent with left L4 radiculopathy.  I have discussed various treatment options with her including doing nothing, continuing medical management, steroid injections, and surgery. I have described the procedure of left L4 hemilaminectomy with L4-5 microdiskectomy with her as well as the risks of surgery extensively.  The patient weighed the risks and benefits and alternatives of surgery and wishes to proceed with the operation.  Multiple medical problems including morbid obesity, hypertension, hypothyroidism, diabetes mellitus, hypercholesterolemia, heart murmur, etc. are noted. DD:  04/09/00 TD:  04/10/00 Job: [id number]

## 2010-06-22 NOTE — Discharge Summary (Signed)
[place]. [facility]  Patient:    Tiffany [name], Tiffany [name]                      MRN: [id number] Adm. Date:  [id number] Disc. Date: [id number] Attending:  Newman Pies D                           Discharge Summary  BRIEF HISTORY:  For full details of this admission, please refer to the typed history and physical.  The patient is a 60 year old black female who has suffered from years of back pain.  She recently began having severe left leg pain which failed medical management.  She was worked up with a lumbar MRI which demonstrated a large herniated nucleus pulposus at L4/5.  I discussed the various treatment options with her and the patient weighed the risks, benefits, and alternatives to surgery and decided to proceed with a L4/5 microdiskectomy.  For past medical history, past surgical history, medications prior to admission, drug allergies, family medical history, social history, admission physical exam, imaging studies, assessment, plan, etc., please refer to typed history and physical.  HOSPITAL COURSE:  I performed a left L4/5 microdiskectomy using microdissection on the patient without complications on April 09, 2000 (for full details of this operations, please refer to typed operative note).  The patients postoperative course was unremarkable.  She was slow to ambulate but by postoperative day #2, i.e. April 11, 2000, the patient was afebrile. Her vital signs were stable.  She was eating well, ambulating well.  Her wound was healing well without signs of infection and her leg pain had resolved. She was requesting discharge to home.  She was therefore discharged on April 11, 2000.  DISCHARGE INSTRUCTIONS:  The patient was instructed to resume her outpatient medical regimen, to follow up with me in four weeks.  FINAL DIAGNOSES:  1. Lumbar vertebrae-4/5 herniated nucleus pulposus.  2. Degenerative disease.  3. Spinal stenosis.  4. Lumbar radiculopathy.  5. Hypertension.  6. Hypothyroidism.  7. Type 2 diabetes mellitus.  8. Hypercholesterolemia.  9. Morbid obesity. 10. Osteoarthritis.  PROCEDURES:  Left L4/5 microdiskectomy using a microdissection. DD:  05/01/00 TD:  05/03/00 Job: [id number]

## 2010-06-22 NOTE — Procedures (Signed)
[facility]  Patient:    Tiffany [name], Tiffany [name] Visit Number: [id number] MRN: [id number]          Service Type: PMG Location: [facility] Attending Physician:  Cloria Spring Dictated by:   Dr. Mercy Riding. Date: 10/23/00 Admit Date:  10/02/2000   CC:         Ophelia Charter, M.D.   Procedure Report  REFERRING Amee Boothe:  Ophelia Charter, M.D.  HISTORY OF PRESENT ILLNESS:  Tiffany [name] is a 60 year old female with degenerative disc disease, status post L4-5 diskectomy for an L4-5 herniated disk in March 2002.  The patient underwent her first lumbar epidural steroid injection one week ago with some improvement.  She now states that her buttock pain bilaterally has resolved, but she still has considerable back pain, and occasionally pain into the lower extremities.  Her pain currently is a 9/10 on a subjective scale, and is constant.  She continues to take Percocet 7.5 mg q.6h.  She continues on Elavil 25 mg at bedtime, which has improved her sleep, although she states she has had vivid dreams.  This is tolerable.  The patient wishes to proceed with a second lumbar epidural steroid injection.  REVIEW OF SYSTEMS:  Health and history form and 14 point review of systems was reviewed.  PHYSICAL EXAMINATION:  GENERAL:  An obese female in no acute distress.  SPINE:  Increased lumbar lordosis with minimal tenderness to palpation bilateral lumbar paraspinals.  Range of motion is guarded in all planes. Manual muscle testing is 5/5 bilateral lower extremities without previously noted giveway weakness.  Sensory examination is intact to light touch bilateral lower extremities.  Muscle stretch reflexes are 1+/4 bilateral patellar, medial hamstrings, and Achilles.  IMPRESSION:  Degenerative disc disease, status post L4-5 diskectomy in March 2002, with bilateral lower extremity radicular symptoms.  PLAN:  Lumbar epidural steroid injection.  The procedure was described  to the patient, and informed consent was obtained.  DESCRIPTION OF PROCEDURE:  The patient was brought back to the fluoroscopy suite and placed on the table in prone position.  Skin was prepped in usual sterile fashion.  Skin and subcutaneous tissue were anesthetized with 3 cc of 1% lidocaine, preservative free.  Under direct fluoroscopic guidance, a 20 gauge 6 inch Tuohy needle was advanced into the left paramedian L5-S1 epidural space with loss of resistance technique.  No CSF, heme, or paresthesia were noted.  This was then injected with 1 cc of preservative free Depo-Medrol 40 mg per cc plus 1 cc of preservative free normal saline.  The patient tolerated the procedure well.  There were no complications.  The patient is to continue current medications, and a new prescription for Percocet 7.5/325 mg one p.o. q.6-8h. p.r.n. severe pain #50 without refills.  The patient is encouraged to decrease her intake of narcotics to q.8h., and understands that this is the goal of the epidural steroid injections is to decrease her narcotic use and improve her pain.  The patient instructed to monitor blood sugars closely for the next 72 hours.  FOLLOWUP:  The patient will return to clinic in one week for re-evaluation and possible third lumbar epidural steroid injection. Dictated by:   Dr. Redmond Pulling Attending Physician:  Cloria Spring DD:  10/23/00 TD:  10/23/00 Job: [id number]

## 2010-09-21 ENCOUNTER — Inpatient Hospital Stay (INDEPENDENT_AMBULATORY_CARE_PROVIDER_SITE_OTHER)
Admission: RE | Admit: 2010-09-21 | Discharge: 2010-09-21 | Disposition: A | Discharge status: Home / Self Care | Payer: Medicare Other | Source: Ambulatory Visit | Attending: Emergency Medicine | Admitting: Emergency Medicine

## 2010-09-21 DIAGNOSIS — J069 Acute upper respiratory infection, unspecified: Secondary | ICD-10-CM

## 2010-11-09 LAB — DIFFERENTIAL
Basophils Absolute: 0
Basophils Relative: 0
Eosinophils Absolute: 0.1
Eosinophils Relative: 1
Lymphocytes Relative: 21
Lymphs Abs: 2.1
Monocytes Absolute: 0.5
Monocytes Relative: 5
Neutro Abs: 7.2
Neutrophils Relative %: 72

## 2010-11-09 LAB — POCT CARDIAC MARKERS
CKMB, poc: 2
Myoglobin, poc: 143
Operator id: 284141
Troponin i, poc: <0.05

## 2010-11-09 LAB — I-STAT 8, (EC8 V) (CONVERTED LAB)
Acid-base deficit: 3 — ABNORMAL HIGH
BUN: 54 — ABNORMAL HIGH
Bicarbonate: 25.4 — ABNORMAL HIGH
Chloride: 109
Glucose, Bld: 264 — ABNORMAL HIGH
HCT: 40
Hemoglobin: 13.6
Operator id: 284141
Potassium: 5.2 — ABNORMAL HIGH
Sodium: 138
TCO2: 27
pCO2, Ven: 56.7 — ABNORMAL HIGH
pH, Ven: 7.259

## 2010-11-09 LAB — CBC
HCT: 34.3 — ABNORMAL LOW
Hemoglobin: 11 — ABNORMAL LOW
MCHC: 32.1
MCV: 85.7
Platelets: 227
RBC: 4.01
RDW: 16 — ABNORMAL HIGH
WBC: 9.9

## 2010-11-09 LAB — POCT I-STAT CREATININE
Creatinine, Ser: 1.5 — ABNORMAL HIGH
Operator id: 284141

## 2011-01-18 ENCOUNTER — Encounter (HOSPITAL_COMMUNITY): Payer: Self-pay

## 2011-01-18 ENCOUNTER — Emergency Department (INDEPENDENT_AMBULATORY_CARE_PROVIDER_SITE_OTHER)
Admission: EM | Admit: 2011-01-18 | Discharge: 2011-01-18 | Disposition: A | Discharge status: Home / Self Care | Payer: Medicare Other | Source: Home / Self Care | Attending: Family Medicine | Admitting: Family Medicine

## 2011-01-18 DIAGNOSIS — R0982 Postnasal drip: Secondary | ICD-10-CM

## 2011-01-18 HISTORY — DX: Essential (primary) hypertension: I10

## 2011-01-18 HISTORY — DX: Disorder of kidney and ureter, unspecified: N28.9

## 2011-01-18 LAB — POCT RAPID STREP A: Streptococcus, Group A Screen (Direct): NEGATIVE

## 2011-01-18 NOTE — ED Provider Notes (Signed)
History     CSN: [id number] Arrival date & time: 01/18/2011  6:34 PM   First MD Initiated Contact with Patient 01/18/11 1739      Chief Complaint  Patient presents with  . Sinusitis    (Consider location/radiation/quality/duration/timing/severity/associated sxs/prior treatment) HPI Comments: 60 y/o female multiple co morbidities here c/o nasal congestion and feeling that she needs to clear her throat constantly with thick yellow secretions for about two weeks. No fever or cough no headache, no difficulty breathing or chest pain, no abdominal pain nausea vomiting or diarrhea.   Past Medical History  Diagnosis Date  . S/P arthroscopy   . Obesity   . Hypothyroid   . Previous back surgery     x 2  . Sleep apnea   . ALLERGIC RHINITIS   . Hypertension   . Diabetes mellitus   . Kidney disease     Past Surgical History  Procedure Date  . Breast biopsy   . Breast reduction surgery   . Lumbar disc surgery     x 2  . Knee surgery     x 2  . Toe surgery   . Treatment fistula anal   . Total abdominal hysterectomy     No family history on file.  History  Substance Use Topics  . Smoking status: Never Smoker   . Smokeless tobacco: Not on file  . Alcohol Use: No    OB History    Grav Para Term Preterm Abortions TAB SAB Ect Mult Living                  Review of Systems  Constitutional: Negative.   HENT: Positive for congestion, sore throat and postnasal drip. Negative for ear pain, trouble swallowing, neck pain, voice change and sinus pressure.   Respiratory: Negative for cough, shortness of breath and wheezing.   Gastrointestinal: Negative for nausea, vomiting, abdominal pain and diarrhea.  Musculoskeletal: Negative for myalgias and arthralgias.  Skin: Negative for rash.  Neurological: Negative for headaches.    Allergies  Review of patient's allergies indicates no active allergies.  Home Medications   Current Outpatient Rx  Name Route Sig Dispense Refill    . ALLOPURINOL 100 MG PO TABS Oral Take 100 mg by mouth 2 (two) times daily.      Marland Kitchen AMLODIPINE BESYLATE 10 MG PO TABS Oral Take 20 mg by mouth daily.      . ASPIRIN 81 MG PO TABS Oral Take 81 mg by mouth daily.      Marland Kitchen HYDROCODONE-ACETAMINOPHEN 5-500 MG PO CAPS Oral Take 1 capsule by mouth every 8 (eight) hours as needed.      . INSULIN GLARGINE 100 UNIT/ML [place] SOLN Subcutaneous Inject 30 Units into the skin at bedtime. As directed    . LEVOTHYROXINE SODIUM 75 MCG PO TABS Oral Take 75 mcg by mouth daily. Takes with 200mg  tab     . OLMESARTAN MEDOXOMIL-HCTZ 40-25 MG PO TABS Oral Take 1 tablet by mouth daily.      Marland Kitchen ROSUVASTATIN CALCIUM 10 MG PO TABS Oral Take 10 mg by mouth daily.      . TOPIRAMATE 25 MG PO TABS Oral Take 25 mg by mouth at bedtime as needed.      . TORSEMIDE 20 MG PO TABS Oral Take 20 mg by mouth every evening.     Marland Kitchen ZOLPIDEM TARTRATE 10 MG PO TABS Oral Take 10 mg by mouth at bedtime as needed.      Marland Kitchen  CLONIDINE HCL 0.3 MG/24HR TD PTWK Transdermal Place 1 patch onto the skin once a week. Patient needs to apply a new patch today     . LEVOTHYROXINE SODIUM 200 MCG PO TABS Oral Take 200 mcg by mouth daily. Takes with 75mg      . OVER THE COUNTER MEDICATION Oral Take 3 oz by mouth 2 (two) times daily. [place] Drink, multivitamin and Bcomplex supplement     . POTASSIUM CHLORIDE 10 MEQ PO TBCR Oral Take 10 mEq by mouth every evening.      . TRIAMCINOLONE ACETONIDE 0.5 % EX CREA Topical Apply 1 application topically 2 (two) times daily. To ankle rash     . UREA 20 % EX CREA Topical Apply 1 application topically 2 (two) times daily. Apply to feet       BP 148/67  Pulse 63  Temp(Src) 98.1 F (36.7 C) (Oral)  Resp 16  SpO2 100%  Physical Exam  Nursing note and vitals reviewed. Constitutional: She is oriented to person, place, and time. She appears well-developed and well-nourished. No distress.  HENT:  Head: Normocephalic and atraumatic.  Right Ear: External ear normal.  Left Ear:  External ear normal.  Mouth/Throat: Oropharynx is clear and moist. No oropharyngeal exudate.       Post nasal drip. Nasal congestion with clear rhinorrhea.  Eyes: Conjunctivae are normal. Pupils are equal, round, and reactive to light.  Neck: Neck supple.  Cardiovascular: Normal rate and regular rhythm.   Pulmonary/Chest: Effort normal and breath sounds normal. No respiratory distress. She has no wheezes. She has no rales. She exhibits no tenderness.  Lymphadenopathy:    She has no cervical adenopathy.  Neurological: She is alert and oriented to person, place, and time.  Skin: No rash noted.    ED Course  Procedures (including critical care time)  Labs Reviewed - No data to display No results found.   1. Postnasal drip       MDM  Negative strep test. Looks well on exam. supportive care.         Randa Spike, MD 01/20/11 [phone]

## 2011-01-18 NOTE — ED Notes (Signed)
C/o sneezing, clear nasal drainage and feeling like the right side of her throat is stopped up with yellow secretions.  States it is difficult for her to get the secretions up. Sx for 2 weeks.  Denies fever or cough

## 2011-01-20 ENCOUNTER — Other Ambulatory Visit: Payer: Self-pay

## 2011-01-20 ENCOUNTER — Encounter (HOSPITAL_COMMUNITY): Payer: Self-pay | Admitting: Emergency Medicine

## 2011-01-20 ENCOUNTER — Emergency Department (HOSPITAL_COMMUNITY): Payer: Medicare Other

## 2011-01-20 ENCOUNTER — Emergency Department (HOSPITAL_COMMUNITY)
Admission: EM | Admit: 2011-01-20 | Discharge: 2011-01-20 | Disposition: A | Discharge status: Home / Self Care | Payer: Medicare Other | Attending: Emergency Medicine | Admitting: Emergency Medicine

## 2011-01-20 DIAGNOSIS — Z794 Long term (current) use of insulin: Secondary | ICD-10-CM | POA: Insufficient documentation

## 2011-01-20 DIAGNOSIS — E119 Type 2 diabetes mellitus without complications: Secondary | ICD-10-CM | POA: Insufficient documentation

## 2011-01-20 DIAGNOSIS — R5383 Other fatigue: Secondary | ICD-10-CM | POA: Insufficient documentation

## 2011-01-20 DIAGNOSIS — R112 Nausea with vomiting, unspecified: Secondary | ICD-10-CM | POA: Insufficient documentation

## 2011-01-20 DIAGNOSIS — E669 Obesity, unspecified: Secondary | ICD-10-CM | POA: Insufficient documentation

## 2011-01-20 DIAGNOSIS — B9789 Other viral agents as the cause of diseases classified elsewhere: Secondary | ICD-10-CM | POA: Insufficient documentation

## 2011-01-20 DIAGNOSIS — B349 Viral infection, unspecified: Secondary | ICD-10-CM

## 2011-01-20 DIAGNOSIS — R5381 Other malaise: Secondary | ICD-10-CM | POA: Insufficient documentation

## 2011-01-20 DIAGNOSIS — E860 Dehydration: Secondary | ICD-10-CM | POA: Insufficient documentation

## 2011-01-20 DIAGNOSIS — R002 Palpitations: Secondary | ICD-10-CM | POA: Insufficient documentation

## 2011-01-20 DIAGNOSIS — G4730 Sleep apnea, unspecified: Secondary | ICD-10-CM | POA: Insufficient documentation

## 2011-01-20 DIAGNOSIS — E039 Hypothyroidism, unspecified: Secondary | ICD-10-CM | POA: Insufficient documentation

## 2011-01-20 DIAGNOSIS — I10 Essential (primary) hypertension: Secondary | ICD-10-CM | POA: Insufficient documentation

## 2011-01-20 DIAGNOSIS — J3489 Other specified disorders of nose and nasal sinuses: Secondary | ICD-10-CM | POA: Insufficient documentation

## 2011-01-20 LAB — URINALYSIS, ROUTINE W REFLEX MICROSCOPIC
Bilirubin Urine: NEGATIVE
Glucose, UA: NEGATIVE mg/dL
Hgb urine dipstick: NEGATIVE
Ketones, ur: NEGATIVE mg/dL
Nitrite: NEGATIVE
Protein, ur: NEGATIVE mg/dL
Specific Gravity, Urine: 1.011 (ref 1.005–1.030)
Urobilinogen, UA: 0.2 mg/dL (ref 0.0–1.0)
pH: 6 (ref 5.0–8.0)

## 2011-01-20 LAB — URINE MICROSCOPIC-ADD ON

## 2011-01-20 LAB — BASIC METABOLIC PANEL
BUN: 92 mg/dL — ABNORMAL HIGH (ref 6–23)
CO2: 21 mEq/L (ref 19–32)
Calcium: 9.4 mg/dL (ref 8.4–10.5)
Chloride: 106 mEq/L (ref 96–112)
Creatinine, Ser: 2.15 mg/dL — ABNORMAL HIGH (ref 0.50–1.10)
GFR calc Af Amer: 28 mL/min — ABNORMAL LOW (ref >90)
GFR calc non Af Amer: 24 mL/min — ABNORMAL LOW (ref >90)
Glucose, Bld: 135 mg/dL — ABNORMAL HIGH (ref 70–99)
Potassium: 5 mEq/L (ref 3.5–5.1)
Sodium: 137 mEq/L (ref 135–145)

## 2011-01-20 LAB — CBC
HCT: 30.1 % — ABNORMAL LOW (ref 36.0–46.0)
Hemoglobin: 9.4 g/dL — ABNORMAL LOW (ref 12.0–15.0)
MCH: 25.8 pg — ABNORMAL LOW (ref 26.0–34.0)
MCHC: 31.2 g/dL (ref 30.0–36.0)
MCV: 82.7 fL (ref 78.0–100.0)
Platelets: 151 10*3/uL (ref 150–400)
RBC: 3.64 MIL/uL — ABNORMAL LOW (ref 3.87–5.11)
RDW: 14.9 % (ref 11.5–15.5)
WBC: 7.7 10*3/uL (ref 4.0–10.5)

## 2011-01-20 LAB — DIFFERENTIAL
Basophils Absolute: 0 10*3/uL (ref 0.0–0.1)
Basophils Relative: 0 % (ref 0–1)
Eosinophils Absolute: 0.2 10*3/uL (ref 0.0–0.7)
Eosinophils Relative: 2 % (ref 0–5)
Lymphocytes Relative: 13 % (ref 12–46)
Lymphs Abs: 1 10*3/uL (ref 0.7–4.0)
Monocytes Absolute: 1 10*3/uL (ref 0.1–1.0)
Monocytes Relative: 13 % — ABNORMAL HIGH (ref 3–12)
Neutro Abs: 5.5 10*3/uL (ref 1.7–7.7)
Neutrophils Relative %: 72 % (ref 43–77)

## 2011-01-20 LAB — POCT I-STAT TROPONIN I: Troponin i, poc: 0.01 ng/mL (ref 0.00–0.08)

## 2011-01-20 MED ORDER — ONDANSETRON 4 MG PO TBDP
8.0000 mg | ORAL_TABLET | Freq: Once | ORAL | Status: AC
  Administered 2011-01-20: 8 mg via ORAL
  Filled 2011-01-20: qty 2

## 2011-01-20 MED ORDER — SODIUM CHLORIDE 0.9 % IV BOLUS (SEPSIS)
1000.0000 mL | Freq: Once | INTRAVENOUS | Status: AC
  Administered 2011-01-20: 1000 mL via INTRAVENOUS

## 2011-01-20 MED ORDER — ONDANSETRON 8 MG PO TBDP: 8.0000 mg | ORAL_TABLET | Freq: Three times a day (TID) | ORAL | Status: AC | PRN

## 2011-01-20 NOTE — ED Notes (Signed)
PT. REPORTS PALPITATIONS WITH VOMITTING AND OCCASIONAL DRY COUGH ONSET YESTERDAY , DENIES CHEST PAIN .

## 2011-01-20 NOTE — ED Provider Notes (Signed)
Medical screening examination/treatment/procedure(s) were performed by non-physician practitioner and as supervising physician I was immediately available for consultation/collaboration.  Kalman Drape, MD 01/20/11 [phone]

## 2011-01-20 NOTE — ED Notes (Signed)
Patient reports N/V denies diarrhea, denies pain. States just doesn't feel right, weak and lack of energy denies cough but reports sputum in the back of her throat

## 2011-01-20 NOTE — ED Provider Notes (Signed)
History     CSN: [id number] Arrival date & time: 01/20/2011  4:38 AM   First MD Initiated Contact with Patient 01/20/11 0601      Chief Complaint  Patient presents with  . Palpitations    (Consider location/radiation/quality/duration/timing/severity/associated sxs/prior treatment) HPI Comments: Patient with history of heart murmur - presents with a two-day history of nasal and throat congestion with production of yellow sputum, palpitations, nausea and vomiting. The nausea and vomiting started yesterday and the patient has vomited 2 times. She was seen in urgent care 2 days ago, diagnosed with sinusitis and discharged. Patient has felt worse since then and presents for evaluation this morning. Patient endorses a feeling that her heart is racing that can occur at any time over the past 2 days. She denies feeling short of breath or lightheaded. She denies chest pain or swelling in her lower extremities.   Patient is a 60 y.o. female presenting with palpitations. The history is provided by the patient.  Palpitations  This is a new problem. The current episode started yesterday. The problem occurs daily. The problem has not changed since onset.Associated symptoms include nausea and sputum production. Pertinent negatives include no diaphoresis, no fever, no numbness, no chest pain, no chest pressure, no syncope, no abdominal pain, no vomiting, no headaches, no lower extremity edema, no weakness, no cough and no shortness of breath. She has tried nothing for the symptoms. Her past medical history is significant for valve disorder.    Past Medical History  Diagnosis Date  . S/P arthroscopy   . Obesity   . Hypothyroid   . Previous back surgery     x 2  . Sleep apnea   . ALLERGIC RHINITIS   . Hypertension   . Diabetes mellitus   . Kidney disease     Past Surgical History  Procedure Date  . Breast biopsy   . Breast reduction surgery   . Lumbar disc surgery     x 2  . Knee surgery    x 2  . Toe surgery   . Treatment fistula anal   . Total abdominal hysterectomy     No family history on file.  History  Substance Use Topics  . Smoking status: Never Smoker   . Smokeless tobacco: Not on file  . Alcohol Use: No    OB History    Grav Para Term Preterm Abortions TAB SAB Ect Mult Living                  Review of Systems  Constitutional: Positive for fatigue. Negative for fever, chills and diaphoresis.  HENT: Positive for congestion and rhinorrhea. Negative for sore throat.   Eyes: Negative for discharge.  Respiratory: Positive for sputum production. Negative for cough and shortness of breath.   Cardiovascular: Positive for palpitations. Negative for chest pain and syncope.  Gastrointestinal: Positive for nausea. Negative for vomiting, abdominal pain, diarrhea and constipation.  Genitourinary: Negative for dysuria and hematuria.  Musculoskeletal: Negative for myalgias.  Skin: Negative for rash.  Neurological: Negative for weakness, numbness and headaches.  Psychiatric/Behavioral: Negative for confusion.    Allergies  Review of patient's allergies indicates no active allergies.  Home Medications   Current Outpatient Rx  Name Route Sig Dispense Refill  . ALLOPURINOL 100 MG PO TABS Oral Take 100 mg by mouth 2 (two) times daily.      Marland Kitchen AMLODIPINE BESYLATE 10 MG PO TABS Oral Take 20 mg by mouth daily.      Marland Kitchen  ASPIRIN 81 MG PO TABS Oral Take 81 mg by mouth daily.      Marland Kitchen CLONIDINE HCL 0.3 MG/24HR TD PTWK Transdermal Place 1 patch onto the skin once a week. Patient needs to apply a new patch today     . HYDROCODONE-ACETAMINOPHEN 5-500 MG PO CAPS Oral Take 1 capsule by mouth every 8 (eight) hours as needed.      . INSULIN GLARGINE 100 UNIT/ML [place] SOLN Subcutaneous Inject 30 Units into the skin at bedtime. As directed    . LEVOTHYROXINE SODIUM 200 MCG PO TABS Oral Take 200 mcg by mouth daily. Takes with 75mg      . LEVOTHYROXINE SODIUM 75 MCG PO TABS Oral Take 75 mcg  by mouth daily. Takes with 200mg  tab     . OLMESARTAN MEDOXOMIL-HCTZ 40-25 MG PO TABS Oral Take 1 tablet by mouth daily.      Marland Kitchen OVER THE COUNTER MEDICATION Oral Take 3 oz by mouth 2 (two) times daily. Nopalea Drink, multivitamin and Bcomplex supplement     . POTASSIUM CHLORIDE 10 MEQ PO TBCR Oral Take 10 mEq by mouth every evening.      Marland Kitchen ROSUVASTATIN CALCIUM 10 MG PO TABS Oral Take 10 mg by mouth daily.      . TOPIRAMATE 25 MG PO TABS Oral Take 25 mg by mouth at bedtime as needed.      . TORSEMIDE 20 MG PO TABS Oral Take 20 mg by mouth every evening.     . TRIAMCINOLONE ACETONIDE 0.5 % EX CREA Topical Apply 1 application topically 2 (two) times daily. To ankle rash     . UREA 20 % EX CREA Topical Apply 1 application topically 2 (two) times daily. Apply to feet     . ZOLPIDEM TARTRATE 10 MG PO TABS Oral Take 10 mg by mouth at bedtime as needed.        BP 165/75  Pulse 83  Temp(Src) 98.8 F (37.1 C) (Oral)  Resp 18  SpO2 100%  Physical Exam  Nursing note and vitals reviewed. Constitutional: She is oriented to person, place, and time. She appears well-developed and well-nourished.       Morbidly obese  HENT:  Head: Normocephalic and atraumatic.  Eyes: Right eye exhibits no discharge. Left eye exhibits no discharge.  Neck: Normal range of motion. Neck supple.  Cardiovascular: Normal rate and regular rhythm.  Exam reveals no gallop and no friction rub.   Murmur heard.      3/6 systolic murmur at left sternal border  Pulmonary/Chest: Effort normal and breath sounds normal. No respiratory distress. She has no wheezes.  Abdominal: Soft. There is no tenderness. There is no rebound and no guarding.  Musculoskeletal: Normal range of motion. She exhibits no edema and no tenderness.  Neurological: She is alert and oriented to person, place, and time.  Skin: Skin is warm and dry. No rash noted.  Psychiatric: She has a normal mood and affect.    ED Course  Procedures (including critical  care time)  Labs Reviewed  CBC - Abnormal; Notable for the following:    RBC 3.64 (*)    Hemoglobin 9.4 (*)    HCT 30.1 (*)    MCH 25.8 (*)    All other components within normal limits  DIFFERENTIAL - Abnormal; Notable for the following:    Monocytes Relative 13 (*)    All other components within normal limits  BASIC METABOLIC PANEL - Abnormal; Notable for the following:    Glucose,  Bld 135 (*)    BUN 92 (*)    Creatinine, Ser 2.15 (*)    GFR calc non Af Amer 24 (*)    GFR calc Af Amer 28 (*)    All other components within normal limits  URINALYSIS, ROUTINE W REFLEX MICROSCOPIC - Abnormal; Notable for the following:    Leukocytes, UA MODERATE (*)    All other components within normal limits  URINE MICROSCOPIC-ADD ON - Abnormal; Notable for the following:    Squamous Epithelial / LPF FEW (*)    Bacteria, UA FEW (*)    All other components within normal limits  POCT I-STAT TROPONIN I  I-STAT TROPONIN I  URINE CULTURE   Dg Chest 2 View  01/20/2011  *RADIOLOGY REPORT*  Clinical Data: Chest pain, nausea and vomiting  CHEST - 2 VIEW  Comparison: 01/30/2007; 05/09/2005  Findings:  Unchanged enlarged cardiac silhouette and mediastinal contours with mild tortuosity of the descending thoracic aorta.  There is mild cephalization of flow without frank evidence of pulmonary edema. No focal airspace opacities.  No pleural effusion or pneumothorax. Post lower lumbar paraspinal fusion, incompletely imaged.  IMPRESSION: Enlarged cardiac silhouette without superimposed acute cardiopulmonary disease.  Original Report Authenticated By: Rachel Moulds, M.D.     1. Dehydration   2. Viral infection   3. Malaise     6:40 AM patient seen and examined. Workup initiated. Do not suspect ACS so no aspirin given. EKG reviewed.   Date: 01/20/2011  Rate: 80  Rhythm: normal sinus rhythm  QRS Axis: normal  Intervals: normal  ST/T Wave abnormalities: normal  Conduction Disutrbances:none  Narrative  Interpretation:   Old EKG Reviewed: no changes (04/23/09)   7:44 AM patient reexamined. She states improvement with her nausea with Zofran. Informed of results at this time. Awaiting BMP.  9:30 AM results reviewed. Results reviewed with Dr. Sharol Given. Patient was given 1 L IV fluids due to elevated BUN. Do not suspect this is from any sort of GI bleed. Patient denies blood in stool, black tarry stools,  vaginal bleeding, hematemesis. Patient feels better after fluids. She has primary care followup urged her to followup Monday or Tuesday with her PCP. Patient urged to return if worsening symptoms, persistent fever, persistent vomiting, worsening pain, or if she has any other concerns. Patient informed of slightly worsened renal insufficiency and asked to follow up with primary care physician.  MDM  Patient with vague symptoms, unconcerning workup. Fluids given to rehydrate. Do not suspect cardiac etiology of symptoms. Do not suspect severe infection. Patient likely has viral URI. She is stable in emergency department, is feeling better, appears well and is stable for discharge home.         [place], [place] 01/20/11 [id number]

## 2011-01-20 NOTE — ED Notes (Signed)
Patient transported to X-ray 

## 2011-01-22 LAB — URINE CULTURE
Colony Count: 40000
Culture  Setup Time: 201212161923

## 2011-01-31 ENCOUNTER — Inpatient Hospital Stay (HOSPITAL_COMMUNITY): Admit: 2011-01-31 | Payer: Self-pay | Admitting: Obstetrics and Gynecology

## 2011-04-17 ENCOUNTER — Telehealth: Payer: Self-pay | Admitting: Internal Medicine

## 2011-04-17 MED ORDER — ZOLPIDEM TARTRATE 10 MG PO TABS: 10.0000 mg | ORAL_TABLET | Freq: Every evening | ORAL | Status: DC | PRN

## 2011-04-17 NOTE — Telephone Encounter (Signed)
LMOMTCB x 1 for pt. Prescription has been called to [facility] on [address].

## 2011-04-17 NOTE — Telephone Encounter (Signed)
Please advise if okay to refill zolpidem for this pt. Her last ov was May 2012 and has ov pending 06/14/11. Thanks

## 2011-04-17 NOTE — Telephone Encounter (Signed)
Ok to refill 

## 2011-04-18 NOTE — Telephone Encounter (Signed)
lmomtcb x 2  

## 2011-04-18 NOTE — Telephone Encounter (Signed)
Pt returned call. [id number]. Tiffany [name]

## 2011-04-18 NOTE — Telephone Encounter (Signed)
I spoke with pt and is aware rx was called in for her and nothing further was needed

## 2011-04-30 ENCOUNTER — Other Ambulatory Visit: Payer: Self-pay | Admitting: Internal Medicine

## 2011-04-30 DIAGNOSIS — Z1231 Encounter for screening mammogram for malignant neoplasm of breast: Secondary | ICD-10-CM

## 2011-05-22 ENCOUNTER — Ambulatory Visit
Admission: RE | Admit: 2011-05-22 | Discharge: 2011-05-22 | Disposition: A | Discharge status: Home / Self Care | Payer: Medicare Other | Source: Ambulatory Visit | Attending: Internal Medicine | Admitting: Internal Medicine

## 2011-05-22 ENCOUNTER — Other Ambulatory Visit: Payer: Self-pay | Admitting: Internal Medicine

## 2011-05-22 DIAGNOSIS — Z1231 Encounter for screening mammogram for malignant neoplasm of breast: Secondary | ICD-10-CM

## 2011-05-22 DIAGNOSIS — N644 Mastodynia: Secondary | ICD-10-CM

## 2011-05-22 DIAGNOSIS — N63 Unspecified lump in unspecified breast: Secondary | ICD-10-CM

## 2011-05-31 ENCOUNTER — Other Ambulatory Visit: Payer: Self-pay | Admitting: Internal Medicine

## 2011-05-31 ENCOUNTER — Ambulatory Visit
Admission: RE | Admit: 2011-05-31 | Discharge: 2011-05-31 | Disposition: A | Discharge status: Home / Self Care | Payer: Medicare Other | Source: Ambulatory Visit | Attending: Internal Medicine | Admitting: Internal Medicine

## 2011-05-31 DIAGNOSIS — N63 Unspecified lump in unspecified breast: Secondary | ICD-10-CM

## 2011-05-31 DIAGNOSIS — N644 Mastodynia: Secondary | ICD-10-CM

## 2011-06-14 ENCOUNTER — Encounter: Payer: Self-pay | Admitting: Internal Medicine

## 2011-06-14 ENCOUNTER — Ambulatory Visit (INDEPENDENT_AMBULATORY_CARE_PROVIDER_SITE_OTHER): Payer: Medicare Other | Admitting: Internal Medicine

## 2011-06-14 VITALS — BP 130/86 | HR 69 | Ht 70.0 in | Wt 297.2 lb

## 2011-06-14 DIAGNOSIS — E039 Hypothyroidism, unspecified: Secondary | ICD-10-CM

## 2011-06-14 DIAGNOSIS — J309 Allergic rhinitis, unspecified: Secondary | ICD-10-CM

## 2011-06-14 DIAGNOSIS — G4733 Obstructive sleep apnea (adult) (pediatric): Secondary | ICD-10-CM

## 2011-06-14 NOTE — Assessment & Plan Note (Signed)
Adequate control with otc antihistamines prn.

## 2011-06-14 NOTE — Patient Instructions (Addendum)
Order- [facility]- increase CPAP to 11    Dx OSA  Please call if you don't like the pressure change,  or whenever you need.

## 2011-06-14 NOTE — Assessment & Plan Note (Signed)
Managed by Dr Carlis Abbott

## 2011-06-14 NOTE — Assessment & Plan Note (Signed)
Good compliance. Not sure about control. Plan- [facility] to increase pressure to 11 for trial. Weight loss would help.

## 2011-06-14 NOTE — Progress Notes (Signed)
  Subjective:    Patient ID: Tiffany [name], female    DOB: [birth year], 61 y.o.   MRN: [id number]  HPI 06/14/10- 60 yoF followed for allergic rhinitis, Hx sleep apnea, complicated by obesity.  Last here May 02, 2009.  She has continued to use CPAP successfully, all night, every night at 10 cwp/ [facility]. We discussed recent study on CPAP use and health. Got through peak pollen season with modest nasal congestion and watery eyes. Claritin was sufficient this year. Never has had asthma.   06/14/11-  56 yoF followed for allergic rhinitis, Hx sleep apnea, complicated by obesity, HTN, hypothyroid, DM.  Wears CPAP every night for approx 6 hours; no more than usual troubles with allergies at this time. Antihistamines are sufficient CPAP 10/[facility]. Still aware of some daytime sleepiness but acceptable. Complicating issues of hypertension, obesity and hypothyroidism are being managed by her physicians.  ROS-see HPI Constitutional:   No-   weight loss, night sweats, fevers, chills, fatigue, lassitude. HEENT:   No-  headaches, difficulty swallowing, tooth/dental problems, sore throat,       No-  sneezing, itching, ear ache, nasal congestion, post nasal drip,  CV:  No-   chest pain, orthopnea, PND, swelling in lower extremities, anasarca,  dizziness, palpitations Resp: No-   shortness of breath with exertion or at rest.              No-   productive cough,  No non-productive cough,  No- coughing up of blood.              No-   change in color of mucus.  No- wheezing.   Skin: No-   rash or lesions. GI:  No-   heartburn, indigestion, abdominal pain, nausea, vomiting,  GU:  MS:  No-   joint pain or swelling.   Neuro-     nothing unusual Psych:  No- change in mood or affect. No depression or anxiety.  No memory loss.  OBJ- Physical Exam General- Alert, Oriented, Affect-appropriate, Distress- none acute, obese Skin- rash-none, lesions- none, excoriation- none Lymphadenopathy- none Head- atraumatic            Eyes- Gross vision intact, PERRLA, conjunctivae and secretions clear, strabismus            Ears- Hearing, canals-normal            Nose- Clear, no-Septal dev, mucus, polyps, erosion, perforation             Throat- Mallampati III , mucosa clear , drainage- none, tonsils- atrophic Neck- flexible , trachea midline, no stridor , thyroid nl, carotid no bruit Chest - symmetrical excursion , unlabored           Heart/CV- RRR , 1/6 systolic murmur RUSB , no gallop  , no rub, nl s1 s2                           - JVD- none , edema- none, stasis changes- none, varices- none           Lung- clear to P&A, wheeze- none, cough- none , dullness-none, rub- none           Chest wall-  Abd-  Br/ Gen/ Rectal- Not done, not indicated Extrem- cyanosis- none, clubbing, none, atrophy- none, strength- nl Neuro- grossly intact to observation

## 2011-06-25 ENCOUNTER — Telehealth: Payer: Self-pay | Admitting: Internal Medicine

## 2011-06-25 NOTE — Telephone Encounter (Signed)
Please advise if okay to refill. Thanks.  

## 2011-06-25 NOTE — Telephone Encounter (Signed)
Ok to refill ambien

## 2011-06-26 MED ORDER — ZOLPIDEM TARTRATE 10 MG PO TABS: 10.0000 mg | ORAL_TABLET | Freq: Every evening | ORAL | Status: DC | PRN

## 2012-04-23 ENCOUNTER — Encounter (HOSPITAL_COMMUNITY): Payer: Self-pay | Admitting: Emergency Medicine

## 2012-04-23 ENCOUNTER — Emergency Department (HOSPITAL_COMMUNITY)
Admission: EM | Admit: 2012-04-23 | Discharge: 2012-04-23 | Disposition: A | Discharge status: Home / Self Care | Payer: Medicare Other | Source: Home / Self Care | Attending: Family Medicine | Admitting: Family Medicine

## 2012-04-23 DIAGNOSIS — R21 Rash and other nonspecific skin eruption: Secondary | ICD-10-CM

## 2012-04-23 DIAGNOSIS — R197 Diarrhea, unspecified: Secondary | ICD-10-CM

## 2012-04-23 LAB — POCT I-STAT, CHEM 8
BUN: 49 mg/dL — ABNORMAL HIGH (ref 6–23)
Calcium, Ion: 1.22 mmol/L (ref 1.13–1.30)
Chloride: 112 mEq/L (ref 96–112)
Creatinine, Ser: 1.7 mg/dL — ABNORMAL HIGH (ref 0.50–1.10)
Glucose, Bld: 129 mg/dL — ABNORMAL HIGH (ref 70–99)
HCT: 38 % (ref 36.0–46.0)
Hemoglobin: 12.9 g/dL (ref 12.0–15.0)
Potassium: 3.9 mEq/L (ref 3.5–5.1)
Sodium: 143 mEq/L (ref 135–145)
TCO2: 26 mmol/L (ref 0–100)

## 2012-04-23 MED ORDER — FLUTICASONE PROPIONATE 0.005 % EX OINT: TOPICAL_OINTMENT | Freq: Two times a day (BID) | CUTANEOUS | Status: DC

## 2012-04-23 MED ORDER — LOPERAMIDE HCL 2 MG PO CAPS: 2.0000 mg | ORAL_CAPSULE | Freq: Four times a day (QID) | ORAL | Status: DC | PRN

## 2012-04-23 MED ORDER — PRAMOXINE HCL 1 % EX LOTN: 1.0000 "application " | TOPICAL_LOTION | Freq: Two times a day (BID) | CUTANEOUS | Status: DC

## 2012-04-23 MED ORDER — ONDANSETRON 4 MG PO TBDP: 4.0000 mg | ORAL_TABLET | Freq: Three times a day (TID) | ORAL | Status: DC | PRN

## 2012-04-23 NOTE — ED Provider Notes (Signed)
History     CSN: [id number]  Arrival date & time 04/23/12  1004   First MD Initiated Contact with Patient 04/23/12 1022      Chief Complaint  Patient presents with  . Diarrhea    (Consider location/radiation/quality/duration/timing/severity/associated sxs/prior treatment) HPI Comments: 62 year old female with history of hypothyroidism and diabetes among other comorbidities. Here complaining of: #1 intermittent episodes of diarrhea liquid, less than 5 per day, with no mucus or blood that started 2 days ago. Has had 2 episodes of diarrhea this morning symptoms associated with nausea but no vomiting. Has tolerated fluids but has decreased appetite and solid intake. Denies abdominal pain, headache, dizziness or leg cramping. No dysuria. No fever or chills. No chest pain or shortest of breath. 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 No sick contacts at home. Patient states she's been on current thyroid dose for a long time over a year and last thyroid check was fourth month ago and it was normal as per her report. Also reports diabetes has been well-controlled.  #2 patient also complaining of a itchy rash in her right lower extremity. Patient reports he had similar rash intermittently for years with episodes of complete remission. She has used triamcinolone prescribed by her primary doctor in the past with improvement. She has generalized dry skin. Has had similar rash in her left lower extremity as well but currently is clear.   Past Medical History  Diagnosis Date  . S/P arthroscopy   . Obesity   . Hypothyroid   . Previous back surgery     x 2  . Sleep apnea   . ALLERGIC RHINITIS   . Hypertension   . Diabetes mellitus   . Kidney disease     Past Surgical History  Procedure Laterality Date  . Breast biopsy    . Breast reduction surgery    . Lumbar disc surgery      x 2  . Knee surgery      x 2  . Toe surgery    . Treatment fistula anal    . Total  abdominal hysterectomy      No family history on file.  History  Substance Use Topics  . Smoking status: Never Smoker   . Smokeless tobacco: Not on file  . Alcohol Use: No    OB History   Grav Para Term Preterm Abortions TAB SAB Ect Mult Living                  Review of Systems  Constitutional: Negative for fever, chills, diaphoresis, activity change, appetite change and fatigue.  HENT: Negative for congestion and rhinorrhea.   Gastrointestinal: Positive for diarrhea. Negative for nausea, vomiting and abdominal pain.  Endocrine: Negative for cold intolerance, heat intolerance, polydipsia, polyphagia and polyuria.  Genitourinary: Negative for dysuria.  Musculoskeletal: Negative for arthralgias.  Skin: Positive for rash.  Neurological: Negative for dizziness and headaches.  All other systems reviewed and are negative.    Allergies  Review of patient's allergies indicates no known allergies.  Home Medications   Current Outpatient Rx  Name  Route  Sig  Dispense  Refill  . allopurinol (ZYLOPRIM) 100 MG tablet   Oral   Take 100 mg by mouth 2 (two) times daily.           Marland Kitchen amLODipine (NORVASC) 10 MG tablet   Oral   Take 20 mg by mouth daily.           Marland Kitchen  aspirin 81 MG tablet   Oral   Take 81 mg by mouth daily.           . cloNIDine (CATAPRES - DOSED IN MG/24 HR) 0.3 mg/24hr   Transdermal   Place 1 patch onto the skin once a week. Patient needs to apply a new patch today          . fluticasone (CUTIVATE) 0.005 % ointment   Topical   Apply topically 2 (two) times daily.   30 g   0   . hydrocodone-acetaminophen (LORCET-HD) 5-500 MG per capsule   Oral   Take 1 capsule by mouth every 8 (eight) hours as needed.           . insulin glargine (LANTUS) 100 UNIT/ML injection   Subcutaneous   Inject 30 Units into the skin at bedtime. As directed         . levothyroxine (SYNTHROID, LEVOTHROID) 200 MCG tablet   Oral   Take 200 mcg by mouth daily. Takes  with 25mg          . levothyroxine (SYNTHROID, LEVOTHROID) 25 MCG tablet   Oral   Take 25 mcg by mouth daily. Take with 271mcg         . loperamide (IMODIUM) 2 MG capsule   Oral   Take 1 capsule (2 mg total) by mouth 4 (four) times daily as needed for diarrhea or loose stools.   12 capsule   0   . olmesartan-hydrochlorothiazide (BENICAR HCT) 40-25 MG per tablet   Oral   Take 1 tablet by mouth daily.           . ondansetron (ZOFRAN-ODT) 4 MG disintegrating tablet   Oral   Take 1 tablet (4 mg total) by mouth every 8 (eight) hours as needed for nausea.   10 tablet   0   . OVER THE COUNTER MEDICATION   Oral   Take 3 oz by mouth 2 (two) times daily. [place]a Drink, multivitamin and Bcomplex supplement          . potassium chloride (KLOR-CON) 10 MEQ CR tablet   Oral   Take 10 mEq by mouth daily as needed.          . pramoxine (SARNA SENSITIVE) 1 % LOTN   Topical   Apply 1 application topically 2 (two) times daily.   118 mL   0   . rosuvastatin (CRESTOR) 10 MG tablet   Oral   Take 10 mg by mouth daily.           Marland Kitchen topiramate (TOPAMAX) 25 MG tablet   Oral   Take 25 mg by mouth at bedtime as needed.           . torsemide (DEMADEX) 20 MG tablet   Oral   Take 20 mg by mouth daily as needed.          . urea (CARMOL) 20 % cream   Topical   Apply 1 application topically 2 (two) times daily. Apply to feet          . zolpidem (AMBIEN) 10 MG tablet   Oral   Take 1 tablet (10 mg total) by mouth at bedtime as needed.   30 tablet   3     BP 153/64  Pulse 70  Temp(Src) 98.1 F (36.7 C) (Oral)  Resp 16  SpO2 100%  Physical Exam  Nursing note and vitals reviewed. Constitutional: She is oriented to person, place, and time. She appears  well-developed and well-nourished. No distress.  HENT:  Head: Normocephalic and atraumatic.  Mouth/Throat: Oropharynx is clear and moist. No oropharyngeal exudate.  Eyes: Conjunctivae are normal. No scleral icterus.   Neck: Neck supple.  Cardiovascular: Normal heart sounds.   Pulmonary/Chest: Breath sounds normal.  Abdominal: Soft. Bowel sounds are normal. She exhibits no distension and no mass. There is no tenderness. There is no rebound and no guarding.  obese  Lymphadenopathy:    She has no cervical adenopathy.  Neurological: She is alert and oriented to person, place, and time.  Skin: She is not diaphoretic.  There is generalized dry skin patches. There is skin thickening, erythema and mild swelling of the hair follicles and scratch marks in lateral lower right leg. No pustules or crusting.     ED Course  Procedures (including critical care time)  Labs Reviewed  POCT I-STAT, CHEM 8 - Abnormal; Notable for the following:    BUN 49 (*)    Creatinine, Ser 1.70 (*)    Glucose, Bld 129 (*)    All other components within normal limits   No results found.   1. Diarrhea   2. Skin rash       MDM   62 y/o diabetic and hypothyroid female here with diarrhea for 2 days. Impress viral enteritis vs hormonal related. No signs of dehydration, clinically well. Electrolytes stable. Patient has increased creatinine which is actually improved from prior results in our records. Prescribed loperamide and discussed supportive care including hydration and red flags that should prompt her return to medical attention was discussed with patient.  My impression is that the right lower extremity rash is likely related to hypothyroidism triggering dry skin and eczema, prescribed fluticasone ointment and pramoxine based lotion.  Asked to followup with her primary care provider if persistent symptoms.   Randa Spike, MD 04/25/12 1243

## 2012-04-23 NOTE — ED Notes (Signed)
Multiple complaints:  Diarrhea intermittent for this past week.  Patient also has red, fine rash to right lower leg.  Dr Gunnar Bulla at bedside

## 2012-04-23 NOTE — ED Notes (Signed)
MD at bedside. 

## 2012-05-18 ENCOUNTER — Other Ambulatory Visit: Payer: Self-pay

## 2012-05-18 DIAGNOSIS — Z1231 Encounter for screening mammogram for malignant neoplasm of breast: Secondary | ICD-10-CM

## 2012-06-15 ENCOUNTER — Ambulatory Visit: Payer: Medicare Other | Admitting: Internal Medicine

## 2012-06-17 ENCOUNTER — Other Ambulatory Visit: Payer: Self-pay | Admitting: Physician Assistant

## 2012-06-24 ENCOUNTER — Ambulatory Visit
Admission: RE | Admit: 2012-06-24 | Discharge: 2012-06-24 | Disposition: A | Discharge status: Home / Self Care | Payer: Medicare Other | Source: Ambulatory Visit

## 2012-06-24 DIAGNOSIS — Z1231 Encounter for screening mammogram for malignant neoplasm of breast: Secondary | ICD-10-CM

## 2012-06-30 ENCOUNTER — Encounter: Payer: Self-pay | Admitting: Physician Assistant

## 2012-07-14 ENCOUNTER — Other Ambulatory Visit: Payer: Self-pay | Admitting: Internal Medicine

## 2012-07-15 NOTE — Telephone Encounter (Signed)
Please advise if okay to refill. Thanks.  

## 2012-07-15 NOTE — Telephone Encounter (Signed)
Ok to refill 

## 2012-07-16 NOTE — Telephone Encounter (Signed)
Called refill to pharmacy voicemail.  

## 2012-07-23 ENCOUNTER — Encounter: Payer: Self-pay | Admitting: Physician Assistant

## 2012-07-23 ENCOUNTER — Ambulatory Visit (INDEPENDENT_AMBULATORY_CARE_PROVIDER_SITE_OTHER): Payer: Medicare Other | Admitting: Physician Assistant

## 2012-07-23 VITALS — BP 172/94 | HR 60 | Temp 98.2°F | Resp 20 | Ht 68.5 in | Wt 314.0 lb

## 2012-07-23 DIAGNOSIS — Z01419 Encounter for gynecological examination (general) (routine) without abnormal findings: Secondary | ICD-10-CM

## 2012-07-23 DIAGNOSIS — Z1239 Encounter for other screening for malignant neoplasm of breast: Secondary | ICD-10-CM

## 2012-07-23 NOTE — Progress Notes (Signed)
Patient ID: Tiffany [name] MRN: [id number], DOB: 13-Feb-1950, 62 y.o. Date of Encounter: 07/23/2012, 9:53 AM    Chief Complaint:  Chief Complaint  Patient presents with  . Annual Exam    ?pap     HPI: 62 y.o. year old AA female here for routine Gyn exam. She saw Dr. Ree Edman for her Gyn care in past. She last saw him in this office-last appt was 08/15/2009.  She goes to [facility] for her Primary Care/Internal Medicine Care. Says she was just seen there 2 weeks ago and has another appt there in near future.  She has no complaints today.  She had hysterectomy (no oophorectomy) in 1990s-secondary to heavy bleeding. Had no cancer. She has had breast reduction surgery. No other h/o breast disease. No breast cancer.  No other Gyn history. No family h/o breast, uterine, or ovarian cancer.    Home Meds: See attached medication section for any medications that were entered at today's visit. The computer does not put those onto this list.The following list is a list of meds entered prior to today's visit.   Current Outpatient Prescriptions on File Prior to Visit  Medication Sig Dispense Refill  . allopurinol (ZYLOPRIM) 100 MG tablet Take 100 mg by mouth 2 (two) times daily.        Marland Kitchen amLODipine (NORVASC) 10 MG tablet Take 10 mg by mouth 2 (two) times daily.       Marland Kitchen aspirin 81 MG tablet Take 81 mg by mouth daily.        . cloNIDine (CATAPRES - DOSED IN MG/24 HR) 0.3 mg/24hr Place 1 patch onto the skin once a week. Patient needs to apply a new patch today       . hydrocodone-acetaminophen (LORCET-HD) 5-500 MG per capsule Take 1 capsule by mouth every 8 (eight) hours as needed.        . insulin glargine (LANTUS) 100 UNIT/ML injection Inject 30 Units into the skin at bedtime. As directed      . levothyroxine (SYNTHROID, LEVOTHROID) 200 MCG tablet Take 200 mcg by mouth daily. Takes with 25mg       . levothyroxine (SYNTHROID, LEVOTHROID) 25 MCG tablet Take 25 mcg by mouth daily. Take with  263mcg every other night      . potassium chloride (KLOR-CON) 10 MEQ CR tablet Take 10 mEq by mouth daily as needed.       . pramoxine (SARNA SENSITIVE) 1 % LOTN Apply 1 application topically 2 (two) times daily.  118 mL  0  . rosuvastatin (CRESTOR) 10 MG tablet Take 10 mg by mouth daily.        Marland Kitchen topiramate (TOPAMAX) 25 MG tablet Take 25 mg by mouth at bedtime as needed.        . torsemide (DEMADEX) 20 MG tablet Take 20 mg by mouth daily as needed.       . zolpidem (AMBIEN) 10 MG tablet TAKE 1 TABLET AT BEDTIME AS NEEDED  30 tablet  2   No current facility-administered medications on file prior to visit.    Allergies: No Known Allergies    Review of Systems: See HPI for pertinent ROS. All other ROS negative.    Physical Exam: Blood pressure 172/94, pulse 60, temperature 98.2 F (36.8 C), temperature source Oral, resp. rate 20, height 5' 8.5" (1.74 m), weight 314 lb (142.429 kg)., Body mass index is 47.04 kg/(m^2). General:  Severely Obese AAF. Appears in no acute distress. Neck: Supple. No thyromegaly. No  lymphadenopathy. Lungs: Clear bilaterally to auscultation without wheezes, rales, or rhonchi. Breathing is unlabored. Heart: Regular rhythm. II/VI murmur. Abdomen: Soft, non-tender, non-distended with normoactive bowel sounds. No hepatomegaly. No rebound/guarding. No obvious abdominal masses. Msk:  Strength and tone normal for age. Extremities/Skin: Warm and dry.  Neuro: Alert and oriented X 3. Moves all extremities spontaneously. Gait is normal. CNII-XII grossly in tact. Psych:  Responds to questions appropriately with a normal affect. Breast Exam: Surgical scar at medial aspect of each breast. O/W breast exam normal bilaterally. No mass. Skin normal with no thickening, erythema. Nipples normal.  Pelvic Exam: Inspection is normal: External Genitalia normal with no lesions. Vaginal Mucosa normal. Bimanual exam normal, c/w hysterectomy. No adnexal mass. Exam is limited secondary to  severe obesity.     ASSESSMENT AND PLAN:  62 y.o. year old female with  1. Screening breast examination Normal Mammogram: 05/18/2012- Negative. Repeat one year  2. Encounter for routine pelvic examination Exam normal.  Guidelines state pap smear not indicated since she has had hysterectomy with no cancer.   3. DEXA: She reports she had this 2 years ago and it was normal.   I told here that her BP is elevated today. She is to f/u with her PCP regarding this. She is aware and voices understanding.    Signed, [address] [place], [place], [facility] 07/23/2012 9:53 AM

## 2012-07-24 ENCOUNTER — Other Ambulatory Visit: Payer: Self-pay | Admitting: Physician Assistant

## 2012-08-06 ENCOUNTER — Ambulatory Visit: Payer: Medicare Other | Admitting: Internal Medicine

## 2012-08-14 ENCOUNTER — Encounter: Payer: Self-pay | Admitting: Internal Medicine

## 2012-08-14 ENCOUNTER — Ambulatory Visit (INDEPENDENT_AMBULATORY_CARE_PROVIDER_SITE_OTHER): Payer: Medicare Other | Admitting: Internal Medicine

## 2012-08-14 VITALS — BP 132/84 | HR 62 | Ht 69.0 in | Wt 315.6 lb

## 2012-08-14 DIAGNOSIS — G4733 Obstructive sleep apnea (adult) (pediatric): Secondary | ICD-10-CM

## 2012-08-14 NOTE — Patient Instructions (Addendum)
Daytime sleepiness, snoring and difficulty controlling high blood pressure are clues that your sleep apnea may need more attention. If there is any question, we will get a new sleep study so we know how you are doing. I wll be happy to see you back as needed.

## 2012-08-14 NOTE — Progress Notes (Signed)
Subjective:    Patient ID: Tiffany [name], female    DOB: 1950/09/06, 62 y.o.   MRN: [id number]  HPI 06/14/10- 60 yoF followed for allergic rhinitis, Hx sleep apnea, complicated by obesity.  Last here May 02, 2009.  She has continued to use CPAP successfully, all night, every night at 10 cwp/ [facility]. We discussed recent study on CPAP use and health. Got through peak pollen season with modest nasal congestion and watery eyes. Claritin was sufficient this year. Never has had asthma.   06/14/11-  69 yoF followed for allergic rhinitis, Hx sleep apnea, complicated by obesity, HTN, hypothyroid, DM.  Wears CPAP every night for approx 6 hours; no more than usual troubles with allergies at this time. Antihistamines are sufficient CPAP 10/[facility]. Still aware of some daytime sleepiness but acceptable. Complicating issues of hypertension, obesity and hypothyroidism are being managed by her physicians.  08/14/12- 49 yoF followed for allergic rhinitis, Hx sleep apnea, complicated by obesity, HTN, hypothyroid, DM.  FOLLOWS FOR: reports not wearing her CPAP since last ov > "it was off when I woke up so I just stopped wearing it".  Reports doing well with allergies > "they didn't bother me much this year."  No new complaints. Feels no different. Lives alone- no one to comment about snoring. No longer needs sleeping pills. Mild allergy season for her, didn't need Rx.  ROS-see HPI Constitutional:   No-   weight loss, night sweats, fevers, chills, fatigue, lassitude. HEENT:   No-  headaches, difficulty swallowing, tooth/dental problems, sore throat,       No-  sneezing, itching, ear ache, nasal congestion, post nasal drip,  CV:  No-   chest pain, orthopnea, PND, swelling in lower extremities, anasarca,  dizziness, palpitations Resp: No-   shortness of breath with exertion or at rest.              No-   productive cough,  No non-productive cough,  No- coughing up of blood.              No-   change in color  of mucus.  No- wheezing.   Skin: No-   rash or lesions. GI:  No-   heartburn, indigestion, abdominal pain, nausea, vomiting,  GU:  MS:  No-   joint pain or swelling.   Neuro-     nothing unusual Psych:  No- change in mood or affect. No depression or anxiety.  No memory loss.  OBJ- Physical Exam General- Alert, Oriented, Affect-appropriate, Distress- none acute, obese Skin- rash-none, lesions- none, excoriation- none Lymphadenopathy- none Head- atraumatic            Eyes- Gross vision intact, PERRLA, conjunctivae and secretions clear, +strabismus            Ears- Hearing, canals-normal            Nose- Clear, no-Septal dev, mucus, polyps, erosion, perforation             Throat- Mallampati III , mucosa clear , drainage- none, tonsils- atrophic Neck- flexible , trachea midline, no stridor , thyroid nl, carotid no bruit Chest - symmetrical excursion , unlabored           Heart/CV- RRR , 1/6 systolic murmur RUSB , no gallop  , no rub, nl s1 s2                           - JVD- none , edema- none, stasis  changes- none, varices- none           Lung- clear to P&A, wheeze- none, cough- none , dullness-none, rub- none           Chest wall-  Abd-  Br/ Gen/ Rectal- Not done, not indicated Extrem- cyanosis- none, clubbing, none, atrophy- none, strength- nl Neuro- grossly intact to observation

## 2012-09-01 NOTE — Assessment & Plan Note (Signed)
No insight. I tried to educate her again. We discussed sleep hygiene, OSA, therapies, weight, driving responsibility.  We left it that we can repeat NPSG if needed, and she is encouraged to go back to her CPAP.

## 2012-10-21 ENCOUNTER — Other Ambulatory Visit: Payer: Self-pay | Admitting: Neurosurgery

## 2012-10-21 DIAGNOSIS — M549 Dorsalgia, unspecified: Secondary | ICD-10-CM

## 2012-11-05 ENCOUNTER — Ambulatory Visit
Admission: RE | Admit: 2012-11-05 | Discharge: 2012-11-05 | Disposition: A | Discharge status: Home / Self Care | Payer: Medicare Other | Source: Ambulatory Visit | Attending: Neurosurgery | Admitting: Neurosurgery

## 2012-11-05 ENCOUNTER — Other Ambulatory Visit: Payer: Self-pay | Admitting: Neurosurgery

## 2012-11-05 DIAGNOSIS — M549 Dorsalgia, unspecified: Secondary | ICD-10-CM

## 2012-12-10 ENCOUNTER — Other Ambulatory Visit: Payer: Self-pay

## 2013-01-04 ENCOUNTER — Encounter (HOSPITAL_COMMUNITY): Payer: Self-pay | Admitting: Emergency Medicine

## 2013-01-04 ENCOUNTER — Emergency Department (HOSPITAL_COMMUNITY)
Admission: EM | Admit: 2013-01-04 | Discharge: 2013-01-04 | Disposition: A | Discharge status: Home / Self Care | Payer: Medicare Other | Source: Home / Self Care | Attending: Emergency Medicine | Admitting: Emergency Medicine

## 2013-01-04 ENCOUNTER — Emergency Department (INDEPENDENT_AMBULATORY_CARE_PROVIDER_SITE_OTHER): Payer: Medicare Other

## 2013-01-04 DIAGNOSIS — J45909 Unspecified asthma, uncomplicated: Secondary | ICD-10-CM

## 2013-01-04 DIAGNOSIS — J40 Bronchitis, not specified as acute or chronic: Secondary | ICD-10-CM

## 2013-01-04 MED ORDER — ALBUTEROL SULFATE (5 MG/ML) 0.5% IN NEBU
2.5000 mg | INHALATION_SOLUTION | Freq: Once | RESPIRATORY_TRACT | Status: AC
  Administered 2013-01-04: 2.5 mg via RESPIRATORY_TRACT

## 2013-01-04 MED ORDER — IPRATROPIUM BROMIDE 0.02 % IN SOLN
RESPIRATORY_TRACT | Status: AC
  Filled 2013-01-04: qty 2.5

## 2013-01-04 MED ORDER — AZITHROMYCIN 250 MG PO TABS: 250.0000 mg | ORAL_TABLET | Freq: Every day | ORAL | Status: DC

## 2013-01-04 MED ORDER — ALBUTEROL SULFATE (5 MG/ML) 0.5% IN NEBU
INHALATION_SOLUTION | RESPIRATORY_TRACT | Status: AC
  Filled 2013-01-04: qty 1

## 2013-01-04 MED ORDER — ALBUTEROL SULFATE HFA 108 (90 BASE) MCG/ACT IN AERS: 1.0000 | INHALATION_SPRAY | Freq: Four times a day (QID) | RESPIRATORY_TRACT | Status: DC | PRN

## 2013-01-04 MED ORDER — ALBUTEROL SULFATE (2.5 MG/3ML) 0.083% IN NEBU: 2.5000 mg | INHALATION_SOLUTION | Freq: Four times a day (QID) | RESPIRATORY_TRACT | Status: DC | PRN

## 2013-01-04 MED ORDER — IPRATROPIUM BROMIDE 0.02 % IN SOLN
0.5000 mg | Freq: Once | RESPIRATORY_TRACT | Status: AC
  Administered 2013-01-04: 0.5 mg via RESPIRATORY_TRACT

## 2013-01-04 MED ORDER — SODIUM CHLORIDE 0.9 % IN NEBU
INHALATION_SOLUTION | RESPIRATORY_TRACT | Status: AC
  Filled 2013-01-04: qty 3

## 2013-01-04 NOTE — ED Notes (Signed)
Cough and wheezing x past 3-4 days; auscultated bilateral

## 2013-01-04 NOTE — ED Provider Notes (Signed)
CSN: [id number]     Arrival date & time 01/04/13  0827 History   First MD Initiated Contact with Patient 01/04/13 0900     Chief Complaint  Patient presents with  . Wheezing   (Consider location/radiation/quality/duration/timing/severity/associated sxs/prior Treatment) Patient is a 62 y.o. female presenting with wheezing. The history is provided by the patient. No language interpreter was used.  Wheezing Severity:  Moderate Onset quality:  Gradual Duration:  3 days Timing:  Constant Progression:  Worsening Chronicity:  New Context: exposure to allergen   Context: not animal exposure   Relieved by:  Nothing Worsened by:  Nothing tried Ineffective treatments:  None tried Associated symptoms: no chest pain     Past Medical History  Diagnosis Date  . S/P arthroscopy   . Obesity   . Hypothyroid   . Previous back surgery     x 2  . Sleep apnea   . ALLERGIC RHINITIS   . Hypertension   . Diabetes mellitus   . Kidney disease    Past Surgical History  Procedure Laterality Date  . Breast biopsy    . Breast reduction surgery    . Lumbar disc surgery      x 2  . Knee surgery      x 2  . Toe surgery    . Treatment fistula anal    . Total abdominal hysterectomy     History reviewed. No pertinent family history. History  Substance Use Topics  . Smoking status: Never Smoker   . Smokeless tobacco: Not on file  . Alcohol Use: No   OB History   Grav Para Term Preterm Abortions TAB SAB Ect Mult Living                 Review of Systems  Respiratory: Positive for wheezing.   Cardiovascular: Negative for chest pain.  All other systems reviewed and are negative.    Allergies  Review of patient's allergies indicates no known allergies.  Home Medications   Current Outpatient Rx  Name  Route  Sig  Dispense  Refill  . allopurinol (ZYLOPRIM) 100 MG tablet   Oral   Take 100 mg by mouth 2 (two) times daily.           Marland Kitchen amLODipine (NORVASC) 10 MG tablet   Oral   Take  10 mg by mouth 2 (two) times daily.          Marland Kitchen aspirin 81 MG tablet   Oral   Take 81 mg by mouth daily.           . cloNIDine (CATAPRES - DOSED IN MG/24 HR) 0.3 mg/24hr   Transdermal   Place 1 patch onto the skin once a week. Patient needs to apply a new patch today          . hydrocodone-acetaminophen (LORCET-HD) 5-500 MG per capsule   Oral   Take 1 capsule by mouth every 8 (eight) hours as needed.           . insulin glargine (LANTUS) 100 UNIT/ML injection   Subcutaneous   Inject 30 Units into the skin at bedtime. As directed         . levothyroxine (SYNTHROID, LEVOTHROID) 200 MCG tablet   Oral   Take 200 mcg by mouth every other day. Alternating with 44mcg         . levothyroxine (SYNTHROID, LEVOTHROID) 25 MCG tablet   Oral   Take 25 mcg by mouth every other day.  Alternating with 274mcg         . potassium chloride (KLOR-CON) 10 MEQ CR tablet   Oral   Take 10 mEq by mouth daily as needed.          . pramoxine (SARNA SENSITIVE) 1 % LOTN   Topical   Apply 1 application topically 2 (two) times daily.   118 mL   0   . rosuvastatin (CRESTOR) 10 MG tablet   Oral   Take 10 mg by mouth daily.           Marland Kitchen topiramate (TOPAMAX) 25 MG tablet   Oral   Take 25 mg by mouth at bedtime as needed.           . torsemide (DEMADEX) 20 MG tablet   Oral   Take 20 mg by mouth daily as needed.          . Vitamin D, Ergocalciferol, (DRISDOL) 50000 UNITS CAPS   Oral   Take 50,000 Units by mouth every 7 (seven) days.         Marland Kitchen zolpidem (AMBIEN) 10 MG tablet      TAKE 1 TABLET AT BEDTIME AS NEEDED   30 tablet   2    BP 174/96  Pulse 74  Temp(Src) 98.7 F (37.1 C) (Oral)  Resp 20  SpO2 99% Physical Exam  Nursing note and vitals reviewed. Constitutional: She is oriented to person, place, and time. She appears well-developed and well-nourished.  HENT:  Head: Normocephalic and atraumatic.  Eyes: EOM are normal. Pupils are equal, round, and reactive to  light.  Neck: Normal range of motion.  Pulmonary/Chest: Effort normal. She has wheezes.  Abdominal: She exhibits no distension.  Musculoskeletal: Normal range of motion.  Neurological: She is alert and oriented to person, place, and time.  Skin: Skin is warm.  Psychiatric: She has a normal mood and affect.    ED Course  Procedures (including critical care time) Labs Review Labs Reviewed - No data to display Imaging Review Dg Chest 2 View  01/04/2013   CLINICAL DATA:  Wheezing and cough for 6 days.  EXAM: CHEST  2 VIEW  COMPARISON:  01/20/2011 and 01/30/2007  FINDINGS: Lungs are adequately inflated without consolidation or effusion. There is mild stable cardiomegaly. There is mild calcified atherosclerotic plaque over the thoracic aorta. Remainder the exam is unchanged.  IMPRESSION: Hypoinflation without acute cardiopulmonary disease.  Mild stable cardiomegaly.   Electronically Signed   By: Marin Olp M.D.   On: 01/04/2013 10:15    EKG Interpretation    Date/Time:    Ventricular Rate:    PR Interval:    QRS Duration:   QT Interval:    QTC Calculation:   R Axis:     Text Interpretation:              MDM improved with albuterol and atrovent.   Pt given rx for inhaler,   Zithromax.   Pt advised to see Dr. Jeanie Cooks for recheck in 2-3 days   1. Asthma   2. Davidson, PA-C 01/04/13 1049

## 2013-01-04 NOTE — ED Provider Notes (Signed)
Medical screening examination/treatment/procedure(s) were performed by non-physician practitioner and as supervising physician I was immediately available for consultation/collaboration.  Philipp Deputy, M.D.  Harden Mo, MD 01/04/13 [phone]

## 2013-03-04 ENCOUNTER — Other Ambulatory Visit: Payer: Self-pay | Admitting: Internal Medicine

## 2013-03-05 NOTE — Telephone Encounter (Signed)
Ok to refill ambien 6 months

## 2013-03-05 NOTE — Telephone Encounter (Signed)
CY, please advise if okay to refill. Thanks.  

## 2013-03-22 NOTE — Telephone Encounter (Signed)
Called refill to pharmacy refill.

## 2013-03-31 ENCOUNTER — Other Ambulatory Visit: Payer: Self-pay | Admitting: Internal Medicine

## 2013-03-31 DIAGNOSIS — E2839 Other primary ovarian failure: Secondary | ICD-10-CM

## 2013-04-20 ENCOUNTER — Ambulatory Visit
Admission: RE | Admit: 2013-04-20 | Discharge: 2013-04-20 | Disposition: A | Discharge status: Home / Self Care | Payer: Medicare Other | Source: Ambulatory Visit | Attending: Internal Medicine | Admitting: Internal Medicine

## 2013-04-20 DIAGNOSIS — E2839 Other primary ovarian failure: Secondary | ICD-10-CM

## 2013-05-05 ENCOUNTER — Ambulatory Visit (INDEPENDENT_AMBULATORY_CARE_PROVIDER_SITE_OTHER): Payer: Medicare Other | Admitting: Interventional Cardiology

## 2013-05-05 ENCOUNTER — Encounter: Payer: Self-pay | Admitting: Interventional Cardiology

## 2013-05-05 VITALS — BP 162/94 | HR 68 | Ht 70.0 in | Wt 308.4 lb

## 2013-05-05 DIAGNOSIS — I10 Essential (primary) hypertension: Secondary | ICD-10-CM

## 2013-05-05 DIAGNOSIS — G4733 Obstructive sleep apnea (adult) (pediatric): Secondary | ICD-10-CM

## 2013-05-05 DIAGNOSIS — R011 Cardiac murmur, unspecified: Secondary | ICD-10-CM

## 2013-05-05 DIAGNOSIS — I5032 Chronic diastolic (congestive) heart failure: Secondary | ICD-10-CM | POA: Insufficient documentation

## 2013-05-05 HISTORY — DX: Chronic diastolic (congestive) heart failure: I50.32

## 2013-05-05 NOTE — Patient Instructions (Signed)
Your physician recommends that you continue on your current medications as directed. Please refer to the Current Medication list given to you today.  Your physician wants you to follow-up in: 1 year You will receive a reminder letter in the mail two months in advance. If you don't receive a letter, please call our office to schedule the follow-up appointment.   2 Gram Low Sodium Diet A 2 gram sodium diet restricts the amount of sodium in the diet to no more than 2 g or 2000 mg daily. Limiting the amount of sodium is often used to help lower blood pressure. It is important if you have heart, liver, or kidney problems. Many foods contain sodium for flavor and sometimes as a preservative. When the amount of sodium in a diet needs to be low, it is important to know what to look for when choosing foods and drinks. The following includes some information and guidelines to help make it easier for you to adapt to a low sodium diet. QUICK TIPS  Do not add salt to food.  Avoid convenience items and fast food.  Choose unsalted snack foods.  Buy lower sodium products, often labeled as "lower sodium" or "no salt added."  Check food labels to learn how much sodium is in 1 serving.  When eating at a restaurant, ask that your food be prepared with less salt or none, if possible. READING FOOD LABELS FOR SODIUM INFORMATION The nutrition facts label is a good place to find how much sodium is in foods. Look for products with no more than 500 to 600 mg of sodium per meal and no more than 150 mg per serving. Remember that 2 g = 2000 mg. The food label may also list foods as:  Sodium-free: Less than 5 mg in a serving.  Very low sodium: 35 mg or less in a serving.  Low-sodium: 140 mg or less in a serving.  Light in sodium: 50% less sodium in a serving. For example, if a food that usually has 300 mg of sodium is changed to become light in sodium, it will have 150 mg of sodium.  Reduced sodium: 25% less  sodium in a serving. For example, if a food that usually has 400 mg of sodium is changed to reduced sodium, it will have 300 mg of sodium. CHOOSING FOODS Grains  Avoid: Salted crackers and snack items. Some cereals, including instant hot cereals. Bread stuffing and biscuit mixes. Seasoned rice or pasta mixes.  Choose: Unsalted snack items. Low-sodium cereals, oats, puffed wheat and rice, shredded wheat. English muffins and bread. Pasta. Meats  Avoid: Salted, canned, smoked, spiced, pickled meats, including fish and poultry. Bacon, ham, sausage, cold cuts, hot dogs, anchovies.  Choose: Low-sodium canned tuna and salmon. Fresh or frozen meat, poultry, and fish. Dairy  Avoid: Processed cheese and spreads. Cottage cheese. Buttermilk and condensed milk. Regular cheese.  Choose: Milk. Low-sodium cottage cheese. Yogurt. Sour cream. Low-sodium cheese. Fruits and Vegetables  Avoid: Regular canned vegetables. Regular canned tomato sauce and paste. Frozen vegetables in sauces. Olives. Tiffany [name]. Relishes. Sauerkraut.  Choose: Low-sodium canned vegetables. Low-sodium tomato sauce and paste. Frozen or fresh vegetables. Fresh and frozen fruit. Condiments  Avoid: Canned and packaged gravies. Worcestershire sauce. Tartar sauce. Barbecue sauce. Soy sauce. Steak sauce. Ketchup. Onion, garlic, and table salt. Meat flavorings and tenderizers.  Choose: Fresh and dried herbs and spices. Low-sodium varieties of mustard and ketchup. Lemon juice. Tabasco sauce. Horseradish. SAMPLE 2 GRAM SODIUM MEAL PLAN Breakfast / Sodium (  mg)  1 cup low-fat milk / [id number] mg  2 slices whole-wheat toast / 270 mg  1 tbs heart-healthy margarine / 153 mg  1 hard-boiled egg / 139 mg  1 small orange / 0 mg Lunch / Sodium (mg)  1 cup raw carrots / 76 mg   cup hummus / 298 mg  1 cup low-fat milk / 143 mg   cup red grapes / 2 mg  1 whole-wheat pita bread / 356 mg Dinner / Sodium (mg)  1 cup whole-wheat pasta / 2  mg  1 cup low-sodium tomato sauce / 73 mg  3 oz lean ground beef / 57 mg  1 small side salad (1 cup raw spinach leaves,  cup cucumber,  cup yellow bell pepper) with 1 tsp olive oil and 1 tsp red wine vinegar / 25 mg Snack / Sodium (mg)  1 container low-fat vanilla yogurt / 107 mg  3 graham cracker squares / 127 mg Nutrient Analysis  Calories: 2033  Protein: 77 g  Carbohydrate: 282 g  Fat: 72 g  Sodium: 1971 mg Document Released: 01/21/2005 Document Revised: 04/15/2011 Document Reviewed: 04/24/2009 Exit[employer] Patient Information 2014 [place], [place].

## 2013-05-05 NOTE — Progress Notes (Signed)
Patient ID: Tiffany [name], female   DOB: 1950/09/11, 63 y.o.   MRN: [id number]    [address], [address] [place], [place]  [address] Phone: [phone] Fax:  [phone]  Date:  05/05/2013   ID:  Tiffany [name], DOB 04-25-1950, MRN [id number]  PCP:  Karis Juba, PA-C   ASSESSMENT:  1. Chronic diastolic heart failure, stable 2. Hypertension, stable 3. Obstructive sleep apnea, noncompliant with therapy 4. Hypertension with borderline control. Not yet had diuretic therapy  PLAN:  1. 2 g sodium diet 2. Weight loss 3. Continue medication regimen as listed 4. Encourage compliance with CPAP   SUBJECTIVE: Tiffany [name] is a 63 y.o. female who has no complaints of dyspnea, palpitations, or syncope. Lower extremity edema has resolved. Not wearing CPAP. She denies neurological complaints. No chest pain.   Wt Readings from Last 3 Encounters:  05/05/13 308 lb 6.4 oz (139.889 kg)  08/14/12 315 lb 9.6 oz (143.155 kg)  07/23/12 314 lb (142.429 kg)     Past Medical History  Diagnosis Date  . S/P arthroscopy   . Obesity   . Hypothyroid   . Previous back surgery     x 2  . Sleep apnea   . ALLERGIC RHINITIS   . Hypertension   . Diabetes mellitus   . Kidney disease     Current Outpatient Prescriptions  Medication Sig Dispense Refill  . albuterol (PROVENTIL HFA;VENTOLIN HFA) 108 (90 BASE) MCG/ACT inhaler Inhale 1-2 puffs into the lungs every 6 (six) hours as needed for wheezing or shortness of breath.  1 Inhaler  0  . albuterol (PROVENTIL) (2.5 MG/3ML) 0.083% nebulizer solution Take 3 mLs (2.5 mg total) by nebulization every 6 (six) hours as needed for wheezing or shortness of breath.  75 mL  12  . allopurinol (ZYLOPRIM) 100 MG tablet Take 100 mg by mouth 2 (two) times daily.        Marland Kitchen amLODipine (NORVASC) 10 MG tablet Take 10 mg by mouth 2 (two) times daily.       Marland Kitchen aspirin 81 MG tablet Take 81 mg by mouth daily.        . cloNIDine (CATAPRES - DOSED IN MG/24 HR) 0.3  mg/24hr Place 1 patch onto the skin once a week. Patient needs to apply a new patch today       . HYDROcodone-acetaminophen (NORCO/VICODIN) 5-325 MG per tablet Take 1 tablet by mouth every 6 (six) hours as needed for moderate pain.      Marland Kitchen insulin glargine (LANTUS) 100 UNIT/ML injection Inject 30 Units into the skin at bedtime. As directed      . levothyroxine (SYNTHROID, LEVOTHROID) 200 MCG tablet Take 200 mcg by mouth every other day. Alternating with 62mcg      . levothyroxine (SYNTHROID, LEVOTHROID) 25 MCG tablet 225 MCG every other day      . losartan (COZAAR) 25 MG tablet Take 25 mg by mouth daily.       . pramoxine (SARNA SENSITIVE) 1 % LOTN Apply 1 application topically 2 (two) times daily.  118 mL  0  . rosuvastatin (CRESTOR) 10 MG tablet Take 10 mg by mouth daily.        Marland Kitchen topiramate (TOPAMAX) 25 MG tablet Take 25 mg by mouth at bedtime as needed.        . torsemide (DEMADEX) 20 MG tablet Take 20 mg by mouth daily as needed.       . Vitamin D, Ergocalciferol, (DRISDOL)  50000 UNITS CAPS Take 50,000 Units by mouth every 7 (seven) days.      Marland Kitchen zolpidem (AMBIEN) 10 MG tablet TAKE 1 TABLET BY MOUTH AT BEDTIME AS NEEDED FOR SLEEP  30 tablet  5   No current facility-administered medications for this visit.    Allergies:   No Known Allergies  Social History:  The patient  reports that she has never smoked. She does not have any smokeless tobacco history on file. She reports that she does not drink alcohol or use illicit drugs.   ROS:  Please see the history of present illness.   Not wearing CPAP. No transient neurological complaints. Compliant with medication regimen.   All other systems reviewed and negative.   OBJECTIVE: VS:  BP 162/94  Pulse 68  Ht 5\' 10"  (1.778 m)  Wt 308 lb 6.4 oz (139.889 kg)  BMI 44.25 kg/m2 Well nourished, well developed, in no acute distress, morbid obesity HEENT: normal Neck: JVD flat. Carotid bruit absent  Cardiac:  normal S1, S2; RRR; no murmur Lungs:   clear to auscultation bilaterally, no wheezing, rhonchi or rales Abd: soft, nontender, no hepatomegaly Ext: Edema absent . Pulses 2+ Skin: warm and dry Neuro:  CNs 2-12 intact, no focal abnormalities noted  EKG:  Normal sinus rhythm with prominent voltage. Left atrial abnormality. Poor R-wave progression       Signed, Illene Labrador III, MD 05/05/2013 8:56 AM

## 2013-08-02 ENCOUNTER — Other Ambulatory Visit: Payer: Self-pay

## 2013-08-02 DIAGNOSIS — Z1231 Encounter for screening mammogram for malignant neoplasm of breast: Secondary | ICD-10-CM

## 2013-08-04 ENCOUNTER — Ambulatory Visit
Admission: RE | Admit: 2013-08-04 | Discharge: 2013-08-04 | Disposition: A | Discharge status: Home / Self Care | Payer: Medicare Other | Source: Ambulatory Visit

## 2013-08-04 DIAGNOSIS — Z1231 Encounter for screening mammogram for malignant neoplasm of breast: Secondary | ICD-10-CM

## 2013-08-11 ENCOUNTER — Encounter: Payer: Self-pay | Admitting: Physician Assistant

## 2013-08-11 ENCOUNTER — Ambulatory Visit (INDEPENDENT_AMBULATORY_CARE_PROVIDER_SITE_OTHER): Payer: Medicare Other | Admitting: Physician Assistant

## 2013-08-11 VITALS — BP 156/84 | HR 60 | Temp 97.7°F | Resp 16 | Ht 69.5 in | Wt 305.0 lb

## 2013-08-11 DIAGNOSIS — Z1239 Encounter for other screening for malignant neoplasm of breast: Secondary | ICD-10-CM

## 2013-08-11 DIAGNOSIS — Z01419 Encounter for gynecological examination (general) (routine) without abnormal findings: Secondary | ICD-10-CM

## 2013-08-11 NOTE — Progress Notes (Signed)
Patient ID: Tiffany [name] MRN: [id number], DOB: 1950-05-20, 63 y.o. Date of Encounter: 08/11/2013, 9:18 AM    Chief Complaint:  Chief Complaint  Patient presents with  . Gynecologic Exam     HPI: 63 y.o. year old AA female here for routine Gyn exam. She saw Dr. Ree Edman for her Gyn care in past. She last saw him in this office-last appt was 08/15/2009.  She saw me 07/23/2012 for Gyn Exam.  She is here today for f/u annual Gyn Exam.  She has no active complaints or concerns today.   She goes to [facility] for her Primary Care/Internal Medicine Care.  She has no complaints today.  She had hysterectomy (no oophorectomy) in 1990s-secondary to heavy bleeding. Had no cancer. She has had breast reduction surgery. No other h/o breast disease. No breast cancer.  No other Gyn history. No family h/o breast, uterine, or ovarian cancer.    Home Meds:   Outpatient Prescriptions Prior to Visit  Medication Sig Dispense Refill  . allopurinol (ZYLOPRIM) 100 MG tablet Take 100 mg by mouth 2 (two) times daily.        Marland Kitchen amLODipine (NORVASC) 10 MG tablet Take 10 mg by mouth 2 (two) times daily.       Marland Kitchen aspirin 81 MG tablet Take 81 mg by mouth daily.        . cloNIDine (CATAPRES - DOSED IN MG/24 HR) 0.3 mg/24hr Place 1 patch onto the skin once a week. Patient needs to apply a new patch today       . HYDROcodone-acetaminophen (NORCO/VICODIN) 5-325 MG per tablet Take 1 tablet by mouth every 6 (six) hours as needed for moderate pain.      Marland Kitchen insulin glargine (LANTUS) 100 UNIT/ML injection Inject 30 Units into the skin at bedtime. As directed      . levothyroxine (SYNTHROID, LEVOTHROID) 200 MCG tablet Take 200 mcg by mouth every other day. Alternating with 53mcg      . levothyroxine (SYNTHROID, LEVOTHROID) 25 MCG tablet 225 MCG every other day      . losartan (COZAAR) 25 MG tablet Take 25 mg by mouth daily.       . pramoxine (SARNA SENSITIVE) 1 % LOTN Apply 1 application topically 2 (two) times  daily.  118 mL  0  . rosuvastatin (CRESTOR) 10 MG tablet Take 10 mg by mouth daily.        Marland Kitchen topiramate (TOPAMAX) 25 MG tablet Take 25 mg by mouth at bedtime as needed.        . Vitamin D, Ergocalciferol, (DRISDOL) 50000 UNITS CAPS Take 50,000 Units by mouth every 7 (seven) days.      Marland Kitchen torsemide (DEMADEX) 20 MG tablet Take 100 mg by mouth daily as needed.       Marland Kitchen albuterol (PROVENTIL HFA;VENTOLIN HFA) 108 (90 BASE) MCG/ACT inhaler Inhale 1-2 puffs into the lungs every 6 (six) hours as needed for wheezing or shortness of breath.  1 Inhaler  0  . albuterol (PROVENTIL) (2.5 MG/3ML) 0.083% nebulizer solution Take 3 mLs (2.5 mg total) by nebulization every 6 (six) hours as needed for wheezing or shortness of breath.  75 mL  12  . zolpidem (AMBIEN) 10 MG tablet TAKE 1 TABLET BY MOUTH AT BEDTIME AS NEEDED FOR SLEEP  30 tablet  5   No facility-administered medications prior to visit.     Allergies: No Known Allergies    Review of Systems: See HPI for pertinent ROS. All other  ROS negative.    Physical Exam: Blood pressure 156/84, pulse 60, temperature 97.7 F (36.5 C), temperature source Oral, resp. rate 16, height 5' 9.5" (1.765 m), weight 305 lb (138.347 kg)., Body mass index is 44.41 kg/(m^2). General:  Severely Obese AAF. Appears in no acute distress. Neck: Supple. No thyromegaly. No lymphadenopathy. Lungs: Clear bilaterally to auscultation without wheezes, rales, or rhonchi. Breathing is unlabored. Heart: Regular rhythm. II/VI murmur. Abdomen: Soft, non-tender, non-distended with normoactive bowel sounds. No hepatomegaly. No rebound/guarding. No obvious abdominal masses. Msk:  Strength and tone normal for age. Extremities/Skin: Warm and dry.  Neuro: Alert and oriented X 3. Moves all extremities spontaneously. Gait is normal. CNII-XII grossly in tact. Psych:  Responds to questions appropriately with a normal affect. Breast Exam: Surgical scar at medial aspect of each breast. O/W breast  exam normal bilaterally. No mass. Skin normal with no thickening, erythema. Nipples normal.  Pelvic Exam: Inspection is normal: External Genitalia normal with no lesions. Vaginal Mucosa normal. Bimanual exam normal, c/w hysterectomy. No adnexal mass. Exam is limited secondary to severe obesity.     ASSESSMENT AND PLAN:  63 y.o. year old female with  1. Screening breast examination Normal Mammogram: [facility]Result reviewed in Epic today- Negative. Repeat one year  2. Encounter for routine pelvic examination Exam normal.  Guidelines state pap smear not indicated since she has had hysterectomy with no cancer.   3. DEXA: She reports she had this 2 years ago and it was normal. She had another DEXA recently--Today I reviewed results in Epic--Done 03/31/13---Normal.   I told here that her BP is elevated today. She is to f/u with her PCP regarding this. She is aware and voices understanding.  F/U in one year or sooner if needed.    Marin Olp [place], [place], [facility] 08/11/2013 9:18 AM

## 2014-03-12 ENCOUNTER — Encounter (HOSPITAL_COMMUNITY): Payer: Self-pay | Admitting: *Deleted

## 2014-03-12 ENCOUNTER — Emergency Department (HOSPITAL_COMMUNITY)
Admission: EM | Admit: 2014-03-12 | Discharge: 2014-03-12 | Disposition: A | Discharge status: Home / Self Care | Payer: Medicare Other | Source: Home / Self Care | Attending: Emergency Medicine | Admitting: Emergency Medicine

## 2014-03-12 DIAGNOSIS — R109 Unspecified abdominal pain: Secondary | ICD-10-CM | POA: Diagnosis not present

## 2014-03-12 LAB — POCT URINALYSIS DIP (DEVICE)
Bilirubin Urine: NEGATIVE
Glucose, UA: NEGATIVE mg/dL
Ketones, ur: NEGATIVE mg/dL
Leukocytes, UA: NEGATIVE
Nitrite: NEGATIVE
Protein, ur: 100 mg/dL — AB
Specific Gravity, Urine: 1.015 (ref 1.005–1.030)
Urobilinogen, UA: 0.2 mg/dL (ref 0.0–1.0)
pH: 7 (ref 5.0–8.0)

## 2014-03-12 MED ORDER — HYDROCODONE-ACETAMINOPHEN 5-325 MG PO TABS: 1.0000 | ORAL_TABLET | Freq: Four times a day (QID) | ORAL | Status: DC | PRN

## 2014-03-12 MED ORDER — TAMSULOSIN HCL 0.4 MG PO CAPS: 0.4000 mg | ORAL_CAPSULE | Freq: Every day | ORAL | Status: DC

## 2014-03-12 MED ORDER — DICLOFENAC SODIUM 75 MG PO TBEC: 75.0000 mg | DELAYED_RELEASE_TABLET | Freq: Two times a day (BID) | ORAL | Status: DC

## 2014-03-12 NOTE — ED Notes (Signed)
C/o L flank pain onset 2/1 but worse last night.  No UTI symptoms ( no hematuria, dysuria, or frequency).  She took baking soda a warm water at bedtime, thinking it was gas, no relief.

## 2014-03-12 NOTE — Discharge Instructions (Signed)
You may have a kidney stone. The pain may also be musculoskeletal. Take Flomax daily for the next week. This will help your ureters relaxed to pass the stone. Take diclofenac twice a day for the next week. Use hydrocodone as needed for pain. Do not take this if you're going to be driving within 8 hours. You can apply ice and heat to the area. If the pain becomes worse, you develop nausea or vomiting, you develop fevers please go to the emergency room.

## 2014-03-12 NOTE — ED Provider Notes (Signed)
CSN: [id number]     Arrival date & time 03/12/14  1017 History   First MD Initiated Contact with Patient 03/12/14 1029     Chief Complaint  Patient presents with  . Flank Pain   (Consider location/radiation/quality/duration/timing/severity/associated sxs/prior Treatment) HPI She is a 64 year old woman here for evaluation of left flank pain. This started gradually on Monday. It is located over the left inferior lateral ribs. It does not radiate. It is described as a deep pain. Nothing seems to make it worse. Nothing really makes it better. It is a constant pain. She has tried baking soda and warm water to relieve gas, with no improvement. She reports regular daily bowel movements. No nausea or vomiting. No dysuria or hematuria. She denies any falls or injuries. She does state she picked up the heavy bottle of laundry detergent before this started.  Past Medical History  Diagnosis Date  . S/P arthroscopy   . Obesity   . Hypothyroid   . Previous back surgery     x 2  . Sleep apnea   . ALLERGIC RHINITIS   . Hypertension   . Diabetes mellitus   . Kidney disease    Past Surgical History  Procedure Laterality Date  . Breast biopsy    . Breast reduction surgery  1982  . Lumbar disc surgery      x 2  . Knee surgery      x 2  . Toe surgery    . Treatment fistula anal    . Total abdominal hysterectomy  1997    partial hysterectomy- still has ovaries   History reviewed. No pertinent family history. History  Substance Use Topics  . Smoking status: Never Smoker   . Smokeless tobacco: Not on file  . Alcohol Use: Yes     Comment: occasional   OB History    No data available     Review of Systems  Gastrointestinal: Negative for nausea, vomiting, diarrhea and constipation.  Genitourinary: Positive for flank pain. Negative for dysuria and hematuria.  Musculoskeletal: Negative for back pain.    Allergies  Review of patient's allergies indicates no known allergies.  Home  Medications   Prior to Admission medications   Medication Sig Start Date End Date Taking? Authorizing Provider  allopurinol (ZYLOPRIM) 100 MG tablet Take 100 mg by mouth 2 (two) times daily.     Yes Historical Provider, MD  amLODipine (NORVASC) 10 MG tablet Take 10 mg by mouth 2 (two) times daily.    Yes Historical Provider, MD  aspirin 81 MG tablet Take 81 mg by mouth daily.     Yes Historical Provider, MD  cloNIDine (CATAPRES - DOSED IN MG/24 HR) 0.3 mg/24hr Place 1 patch onto the skin once a week. Patient needs to apply a new patch today    Yes Historical Provider, MD  insulin glargine (LANTUS) 100 UNIT/ML injection Inject 30 Units into the skin at bedtime. As directed   Yes Historical Provider, MD  levothyroxine (SYNTHROID, LEVOTHROID) 200 MCG tablet Take 200 mcg by mouth every other day. Alternating with 26mcg   Yes Historical Provider, MD  levothyroxine (SYNTHROID, LEVOTHROID) 25 MCG tablet 225 MCG every other day   Yes Historical Provider, MD  losartan (COZAAR) 25 MG tablet Take 25 mg by mouth daily.  04/23/13  Yes Historical Provider, MD  Multiple Vitamins-Minerals (MULTIVITAMIN WITH MINERALS) tablet Take 1 tablet by mouth daily.   Yes Historical Provider, MD  rosuvastatin (CRESTOR) 10 MG tablet Take 10 mg  by mouth daily.     Yes Historical Provider, MD  topiramate (TOPAMAX) 25 MG tablet Take 25 mg by mouth at bedtime as needed.     Yes Historical Provider, MD  torsemide (DEMADEX) 100 MG tablet Take 100 mg by mouth every other day.    Yes Historical Provider, MD  torsemide (DEMADEX) 100 MG tablet Take 50 mg by mouth every other day.   Yes Historical Provider, MD  diclofenac (VOLTAREN) 75 MG EC tablet Take 1 tablet (75 mg total) by mouth 2 (two) times daily. 03/12/14   Melony Overly, MD  HYDROcodone-acetaminophen (NORCO) 5-325 MG per tablet Take 1 tablet by mouth every 6 (six) hours as needed for moderate pain. 03/12/14   Melony Overly, MD  pramoxine (SARNA SENSITIVE) 1 % LOTN Apply 1 application  topically 2 (two) times daily. 04/23/12   Adlih Moreno-Coll, MD  tamsulosin (FLOMAX) 0.4 MG CAPS capsule Take 1 capsule (0.4 mg total) by mouth daily after breakfast. 03/12/14   Melony Overly, MD  Vitamin D, Ergocalciferol, (DRISDOL) 50000 UNITS CAPS Take 50,000 Units by mouth every 7 (seven) days.    Historical Provider, MD   BP 149/89 mmHg  Pulse 64  Temp(Src) 97.8 F (36.6 C) (Oral)  Resp 20  SpO2 99% Physical Exam  Constitutional: She is oriented to person, place, and time. She appears well-developed and well-nourished.  Cardiovascular: Normal rate.   Pulmonary/Chest: Effort normal.  Musculoskeletal:  Left side: No bruising or rashes. She is mildly tender over the lateral ribs.  Neurological: She is alert and oriented to person, place, and time.    ED Course  Procedures (including critical care time) Labs Review Labs Reviewed  POCT URINALYSIS DIP (DEVICE) - Abnormal; Notable for the following:    Hgb urine dipstick TRACE (*)    Protein, ur 100 (*)    All other components within normal limits    Imaging Review No results found.   MDM   1. Left flank pain    With trace hemoglobin in the urine, kidney stone as possible. Musculoskeletal etiology is also possible. We'll treat with Flomax, diclofenac, Norco. Reviewed reasons to return as in after visit summary.    Melony Overly, MD 03/12/14 [phone]

## 2014-03-14 DIAGNOSIS — M79652 Pain in left thigh: Secondary | ICD-10-CM | POA: Diagnosis not present

## 2014-03-14 DIAGNOSIS — M47817 Spondylosis without myelopathy or radiculopathy, lumbosacral region: Secondary | ICD-10-CM | POA: Diagnosis not present

## 2014-03-14 DIAGNOSIS — M961 Postlaminectomy syndrome, not elsewhere classified: Secondary | ICD-10-CM | POA: Diagnosis not present

## 2014-03-14 DIAGNOSIS — M545 Low back pain: Secondary | ICD-10-CM | POA: Diagnosis not present

## 2014-04-04 ENCOUNTER — Encounter: Payer: Self-pay | Admitting: Internal Medicine

## 2014-04-04 ENCOUNTER — Ambulatory Visit (INDEPENDENT_AMBULATORY_CARE_PROVIDER_SITE_OTHER): Payer: Medicare Other | Admitting: Internal Medicine

## 2014-04-04 VITALS — BP 152/80 | HR 67 | Ht 68.0 in | Wt 302.0 lb

## 2014-04-04 DIAGNOSIS — G4733 Obstructive sleep apnea (adult) (pediatric): Secondary | ICD-10-CM | POA: Diagnosis not present

## 2014-04-04 DIAGNOSIS — J309 Allergic rhinitis, unspecified: Secondary | ICD-10-CM

## 2014-04-04 DIAGNOSIS — J302 Other seasonal allergic rhinitis: Secondary | ICD-10-CM

## 2014-04-04 DIAGNOSIS — J3089 Other allergic rhinitis: Secondary | ICD-10-CM

## 2014-04-04 NOTE — Progress Notes (Signed)
Subjective:    Patient ID: Tiffany [name], female    DOB: [birth year], 64 y.o.   MRN: [id number]  HPI 06/14/10- 60 yoF followed for allergic rhinitis, Hx sleep apnea, complicated by obesity.  Last here May 02, 2009.  She has continued to use CPAP successfully, all night, every night at 10 cwp/ [facility]. We discussed recent study on CPAP use and health. Got through peak pollen season with modest nasal congestion and watery eyes. Claritin was sufficient this year. Never has had asthma.   06/14/11-  4 yoF followed for allergic rhinitis, Hx sleep apnea, complicated by obesity, HTN, hypothyroid, DM.  Wears CPAP every night for approx 6 hours; no more than usual troubles with allergies at this time. Antihistamines are sufficient CPAP 10/[facility]. Still aware of some daytime sleepiness but acceptable. Complicating issues of hypertension, obesity and hypothyroidism are being managed by her physicians.  08/14/12- 39 yoF followed for allergic rhinitis, Hx sleep apnea, complicated by obesity, HTN, hypothyroid, DM.  FOLLOWS FOR: reports not wearing her CPAP since last ov > "it was off when I woke up so I just stopped wearing it".  Reports doing well with allergies > "they didn't bother me much this year."  No new complaints. Feels no different. Lives alone- no one to comment about snoring. No longer needs sleeping pills. Mild allergy season for her, didn't need Rx.  04/04/14- 60 yoF followed for allergic rhinitis, Hx sleep apnea, complicated by obesity, HTN, hypothyroid, DM.  FOLLOWS FOR: sleep apnea DME [facility]. Pt reports machine broke last week. She had been wearing cpap 10 [facility]  6 hours nightly.. She needs new equipment.  ROS-see HPI Constitutional:   No-   weight loss, night sweats, fevers, chills, fatigue, lassitude. HEENT:   No-  headaches, difficulty swallowing, tooth/dental problems, sore throat,       No-  sneezing, itching, ear ache, nasal congestion, post nasal drip,  CV:  No-   chest pain,  orthopnea, PND, swelling in lower extremities, anasarca,  dizziness, palpitations Resp: No-   shortness of breath with exertion or at rest.              No-   productive cough,  No non-productive cough,  No- coughing up of blood.              No-   change in color of mucus.  No- wheezing.   Skin: No-   rash or lesions. GI:  No-   heartburn, indigestion, abdominal pain, nausea, vomiting,  GU:  MS:  No-   joint pain or swelling.   Neuro-     nothing unusual Psych:  No- change in mood or affect. No depression or anxiety.  No memory loss.  OBJ- Physical Exam General- Alert, Oriented, Affect-appropriate, Distress- none acute, +obese Skin- rash-none, lesions- none, excoriation- none Lymphadenopathy- none Head- atraumatic            Eyes- Gross vision intact, PERRLA, conjunctivae and secretions clear, +strabismus            Ears- Hearing, canals-normal            Nose- Clear, no-Septal dev, mucus, polyps, erosion, perforation             Throat- Mallampati III , mucosa clear , drainage- none, tonsils- atrophic Neck- flexible , trachea midline, no stridor , thyroid nl, carotid no bruit Chest - symmetrical excursion , unlabored           Heart/CV- RRR , 1/6 systolic  murmur RUSB , no gallop  , no rub, nl s1 s2                           - JVD- none , edema- none, stasis changes- none, varices- none           Lung- clear to P&A, wheeze- none, cough- none , dullness-none, rub- none           Chest wall-  Abd-  Br/ Gen/ Rectal- Not done, not indicated Extrem- cyanosis- none, clubbing, none, atrophy- none, strength- nl Neuro- grossly intact to observation

## 2014-04-04 NOTE — Patient Instructions (Signed)
Order- DME [facility]- replacement for worn out CPAP machine, 10 cwp, mask of choice, humidifier, supplies  Dx [facility]SA  Please call as needed

## 2014-04-05 ENCOUNTER — Ambulatory Visit: Payer: Medicare Other | Admitting: Internal Medicine

## 2014-04-05 NOTE — Assessment & Plan Note (Signed)
We think her pressure has been at 10. Once established we can get a download. Plan-replacement CPAP machine 10 CWP/[facility]

## 2014-04-05 NOTE — Assessment & Plan Note (Signed)
Seasonal allergic rhinitis has been a problem over the years. Spring pollen season is just about to start. I reviewed use of antihistamines and Flonase

## 2014-04-06 DIAGNOSIS — E784 Other hyperlipidemia: Secondary | ICD-10-CM | POA: Diagnosis not present

## 2014-04-06 DIAGNOSIS — E1121 Type 2 diabetes mellitus with diabetic nephropathy: Secondary | ICD-10-CM | POA: Diagnosis not present

## 2014-04-06 DIAGNOSIS — I10 Essential (primary) hypertension: Secondary | ICD-10-CM | POA: Diagnosis not present

## 2014-04-06 DIAGNOSIS — E039 Hypothyroidism, unspecified: Secondary | ICD-10-CM | POA: Diagnosis not present

## 2014-04-06 DIAGNOSIS — I509 Heart failure, unspecified: Secondary | ICD-10-CM | POA: Diagnosis not present

## 2014-04-27 ENCOUNTER — Encounter: Payer: Self-pay | Admitting: *Deleted

## 2014-04-28 ENCOUNTER — Encounter: Payer: Self-pay | Admitting: Interventional Cardiology

## 2014-04-28 ENCOUNTER — Ambulatory Visit (INDEPENDENT_AMBULATORY_CARE_PROVIDER_SITE_OTHER): Payer: Medicare Other | Admitting: Interventional Cardiology

## 2014-04-28 VITALS — BP 124/80 | HR 67 | Ht 68.0 in | Wt 307.8 lb

## 2014-04-28 DIAGNOSIS — N183 Chronic kidney disease, stage 3 unspecified: Secondary | ICD-10-CM

## 2014-04-28 DIAGNOSIS — I5032 Chronic diastolic (congestive) heart failure: Secondary | ICD-10-CM

## 2014-04-28 DIAGNOSIS — I10 Essential (primary) hypertension: Secondary | ICD-10-CM

## 2014-04-28 DIAGNOSIS — N185 Chronic kidney disease, stage 5: Secondary | ICD-10-CM | POA: Insufficient documentation

## 2014-04-28 DIAGNOSIS — G4733 Obstructive sleep apnea (adult) (pediatric): Secondary | ICD-10-CM

## 2014-04-28 NOTE — Progress Notes (Signed)
Cardiology Office Note   Date:  04/28/2014   ID:  Tiffany [name], DOB 1950/04/15, MRN [id number]  PCP:  Karis Juba, PA-C  Cardiologist:   Sinclair Grooms, MD   No chief complaint on file.     History of Present Illness: Tiffany [name] is a 64 y.o. female who presents for diastolic heart failure, hypertension, obesity, and also a background history of CK D stage III. She denies orthopnea PND. No episodes of syncope or prolonged palpitation. She is concerned about a history of enlarged heart. She denies chest pain.    Past Medical History  Diagnosis Date  . S/P arthroscopy   . Obesity   . Hypothyroid   . Previous back surgery     x 2  . Sleep apnea   . ALLERGIC RHINITIS   . Hypertension   . Diabetes mellitus   . Kidney disease     Past Surgical History  Procedure Laterality Date  . Breast biopsy    . Breast reduction surgery  1982  . Lumbar disc surgery      x 2  . Knee surgery      x 2  . Toe surgery    . Treatment fistula anal    . Total abdominal hysterectomy  1997    partial hysterectomy- still has ovaries     Current Outpatient Prescriptions  Medication Sig Dispense Refill  . allopurinol (ZYLOPRIM) 100 MG tablet Take 100 mg by mouth 2 (two) times daily.      Marland Kitchen amLODipine (NORVASC) 10 MG tablet Take 10 mg by mouth 2 (two) times daily.     Marland Kitchen aspirin 81 MG tablet Take 81 mg by mouth daily.      . cloNIDine (CATAPRES - DOSED IN MG/24 HR) 0.3 mg/24hr Place 1 patch onto the skin once a week. Patient needs to apply a new patch today     . HYDROcodone-acetaminophen (NORCO) 5-325 MG per tablet Take 1 tablet by mouth every 6 (six) hours as needed for moderate pain. 20 tablet 0  . insulin glargine (LANTUS) 100 UNIT/ML injection Inject 30 Units into the skin at bedtime. As directed    . levothyroxine (SYNTHROID, LEVOTHROID) 200 MCG tablet Take 200 mcg by mouth every other day. Alternating with 7mcg    . levothyroxine (SYNTHROID, LEVOTHROID) 25 MCG tablet  225 MCG every other day    . losartan (COZAAR) 25 MG tablet Take 25 mg by mouth daily.     . Multiple Vitamins-Minerals (MULTIVITAMIN WITH MINERALS) tablet Take 1 tablet by mouth daily.    . pramoxine (SARNA SENSITIVE) 1 % LOTN Apply 1 application topically 2 (two) times daily. 118 mL 0  . rosuvastatin (CRESTOR) 10 MG tablet Take 10 mg by mouth daily.      Marland Kitchen topiramate (TOPAMAX) 25 MG tablet Take 25 mg by mouth at bedtime as needed.      . torsemide (DEMADEX) 100 MG tablet Take 100 mg by mouth every other day.      No current facility-administered medications for this visit.    Allergies:   Review of patient's allergies indicates no known allergies.    Social History:  The patient  reports that she has never smoked. She does not have any smokeless tobacco history on file. She reports that she drinks alcohol. She reports that she does not use illicit drugs.   Family History:  The patient's family history includes Congestive Heart Failure in her mother; Diabetes in her sister;  Other in her sister; Prostate cancer in her father.    ROS:  Please see the history of present illness.   Otherwise, review of systems are positive for occasional back pain requiring spinal injections. Occasional lower extremity swelling. Occasional palpitations..   All other systems are reviewed and negative.    PHYSICAL EXAM: VS:  BP 124/80 mmHg  Pulse 67  Ht 5\' 8"  (1.727 m)  Wt 307 lb 12.8 oz (139.617 kg)  BMI 46.81 kg/m2 , BMI Body mass index is 46.81 kg/(m^2). GEN: Well nourished, well developed, in no acute distress HEENT: normal Neck: no JVD, carotid bruits, or masses Cardiac: 2/6 systolic murmur, left lower sternal border. RRR;  rubs, or gallops,no edema  Respiratory:  clear to auscultation bilaterally, normal work of breathing GI: soft, nontender, nondistended, + BS MS: no deformity or atrophy Skin: warm and dry, no rash Neuro:  Strength and sensation are intact Psych: euthymic mood, full  affect   EKG:  EKG is ordered today. The ekg ordered today demonstrates normal sinus rhythm with left atrial enlargement. Otherwise unremarkable.   Recent Labs: No results found for requested labs within last 365 days.    Lipid Panel No results found for: CHOL, TRIG, HDL, CHOLHDL, VLDL, LDLCALC, LDLDIRECT    Wt Readings from Last 3 Encounters:  04/28/14 307 lb 12.8 oz (139.617 kg)  04/04/14 302 lb (136.986 kg)  08/11/13 305 lb (138.347 kg)      Other studies Reviewed: Additional studies/ records that were reviewed today include: None.    ASSESSMENT AND PLAN:  Chronic diastolic heart failure: No evidence of volume overload  Essential hypertension: Excellent control  Obstructive sleep apnea: Wearing a seat patent mask regularly  Chronic kidney disease stage III: Followed by nephrology, Dr. Erling Cruz     Current medicines are reviewed at length with the patient today.  The patient does not have concerns regarding medicines.  The following changes have been made:  Avoid nonsteroidal anti-inflammatory agents. [employer] and is taken off of her medication list. Aerobic exercises recommended. Weight loss is recommended.  Labs/ tests ordered today include: To reassess LV wall thickness   Orders Placed This Encounter  Procedures  . EKG 12-Lead  . 2D Echocardiogram without contrast     Disposition:   FU with Linard Millers in 12 months   Signed, Sinclair Grooms, MD  04/28/2014 8:43 AM    [facility], [place], [place]  [address] Phone: [phone]; Fax: [phone]

## 2014-04-28 NOTE — Patient Instructions (Signed)
Your physician recommends that you continue on your current medications as directed. Please refer to the Current Medication list given to you today.  Your physician has requested that you have an echocardiogram. Echocardiography is a painless test that uses sound waves to create images of your heart. It provides your doctor with information about the size and shape of your heart and how well your heart's chambers and valves are working. This procedure takes approximately one hour. There are no restrictions for this procedure.   Your physician wants you to follow-up in: 1 year with Dr.Smith You will receive a reminder letter in the mail two months in advance. If you don't receive a letter, please call our office to schedule the follow-up appointment.    Low-Sodium Eating Plan Sodium raises blood pressure and causes water to be held in the body. Getting less sodium from food will help lower your blood pressure, reduce any swelling, and protect your heart, liver, and kidneys. We get sodium by adding salt (sodium chloride) to food. Most of our sodium comes from canned, boxed, and frozen foods. Restaurant foods, fast foods, and pizza are also very high in sodium. Even if you take medicine to lower your blood pressure or to reduce fluid in your body, getting less sodium from your food is important. WHAT IS MY PLAN? Most people should limit their sodium intake to 2,300 mg a day. Your health care provider recommends that you limit your sodium intake to __________ a day.  WHAT DO I NEED TO KNOW ABOUT THIS EATING PLAN? For the low-sodium eating plan, you will follow these general guidelines:  Choose foods with a % Daily Value for sodium of less than 5% (as listed on the food label).   Use salt-free seasonings or herbs instead of table salt or sea salt.   Check with your health care provider or pharmacist before using salt substitutes.   Eat fresh foods.  Eat more vegetables and fruits.  Limit  canned vegetables. If you do use them, rinse them well to decrease the sodium.   Limit cheese to 1 oz (28 g) per day.   Eat lower-sodium products, often labeled as "lower sodium" or "no salt added."  Avoid foods that contain monosodium glutamate (MSG). MSG is sometimes added to [place] food and some canned foods.  Check food labels (Nutrition Facts labels) on foods to learn how much sodium is in one serving.  Eat more home-cooked food and less restaurant, buffet, and fast food.  When eating at a restaurant, ask that your food be prepared with less salt or none, if possible.  HOW DO I READ FOOD LABELS FOR SODIUM INFORMATION? The Nutrition Facts label lists the amount of sodium in one serving of the food. If you eat more than one serving, you must multiply the listed amount of sodium by the number of servings. Food labels may also identify foods as:  Sodium free--Less than 5 mg in a serving.  Very low sodium--35 mg or less in a serving.  Low sodium--140 mg or less in a serving.  Light in sodium--50% less sodium in a serving. For example, if a food that usually has 300 mg of sodium is changed to become light in sodium, it will have 150 mg of sodium.  Reduced sodium--25% less sodium in a serving. For example, if a food that usually has 400 mg of sodium is changed to reduced sodium, it will have 300 mg of sodium. WHAT FOODS CAN I EAT? Grains Low-sodium  cereals, including oats, puffed wheat and rice, and shredded wheat cereals. Low-sodium crackers. Unsalted rice and pasta. Lower-sodium bread.  Vegetables Frozen or fresh vegetables. Low-sodium or reduced-sodium canned vegetables. Low-sodium or reduced-sodium tomato sauce and paste. Low-sodium or reduced-sodium tomato and vegetable juices.  Fruits Fresh, frozen, and canned fruit. Fruit juice.  Meat and Other Protein Products Low-sodium canned tuna and salmon. Fresh or frozen meat, poultry, seafood, and fish. Lamb. Unsalted nuts.  Dried beans, peas, and lentils without added salt. Unsalted canned beans. Homemade soups without salt. Eggs.  Dairy Milk. Soy milk. Ricotta cheese. Low-sodium or reduced-sodium cheeses. Yogurt.  Condiments Fresh and dried herbs and spices. Salt-free seasonings. Onion and garlic powders. Low-sodium varieties of mustard and ketchup. Lemon juice.  Fats and Oils Reduced-sodium salad dressings. Unsalted butter.  Other Unsalted popcorn and pretzels.  The items listed above may not be a complete list of recommended foods or beverages. Contact your dietitian for more options. WHAT FOODS ARE NOT RECOMMENDED? Grains Instant hot cereals. Bread stuffing, pancake, and biscuit mixes. Croutons. Seasoned rice or pasta mixes. Noodle soup cups. Boxed or frozen macaroni and cheese. Self-rising flour. Regular salted crackers. Vegetables Regular canned vegetables. Regular canned tomato sauce and paste. Regular tomato and vegetable juices. Frozen vegetables in sauces. Salted french fries. Olives. Angie Fava. Relishes. Sauerkraut. Salsa. Meat and Other Protein Products Salted, canned, smoked, spiced, or pickled meats, seafood, or fish. Bacon, ham, sausage, hot dogs, corned beef, chipped beef, and packaged luncheon meats. Salt pork. Jerky. Pickled herring. Anchovies, regular canned tuna, and sardines. Salted nuts. Dairy Processed cheese and cheese spreads. Cheese curds. Blue cheese and cottage cheese. Buttermilk.  Condiments Onion and garlic salt, seasoned salt, table salt, and sea salt. Canned and packaged gravies. Worcestershire sauce. Tartar sauce. Barbecue sauce. Teriyaki sauce. Soy sauce, including reduced sodium. Steak sauce. Fish sauce. Oyster sauce. Cocktail sauce. Horseradish. Regular ketchup and mustard. Meat flavorings and tenderizers. Bouillon cubes. Hot sauce. Tabasco sauce. Marinades. Taco seasonings. Relishes. Fats and Oils Regular salad dressings. Salted butter. Margarine. Ghee. Bacon fat.    Other Potato and tortilla chips. Corn chips and puffs. Salted popcorn and pretzels. Canned or dried soups. Pizza. Frozen entrees and pot pies.  The items listed above may not be a complete list of foods and beverages to avoid. Contact your dietitian for more information. Document Released: 07/13/2001 Document Revised: 01/26/2013 Document Reviewed: 11/25/2012 [facility] Patient Information 2015 [place], [place]. This information is not intended to replace advice given to you by your health care provider. Make sure you discuss any questions you have with your health care provider.

## 2014-05-03 DIAGNOSIS — E119 Type 2 diabetes mellitus without complications: Secondary | ICD-10-CM | POA: Diagnosis not present

## 2014-05-03 DIAGNOSIS — E039 Hypothyroidism, unspecified: Secondary | ICD-10-CM | POA: Diagnosis not present

## 2014-05-04 ENCOUNTER — Ambulatory Visit (HOSPITAL_COMMUNITY): Payer: Medicare Other | Attending: Cardiovascular Disease

## 2014-05-04 DIAGNOSIS — I5032 Chronic diastolic (congestive) heart failure: Secondary | ICD-10-CM | POA: Diagnosis not present

## 2014-05-04 NOTE — Progress Notes (Signed)
2D Echo completed. 05/04/2014

## 2014-05-05 ENCOUNTER — Telehealth: Payer: Self-pay

## 2014-05-05 NOTE — Telephone Encounter (Signed)
-----   Message from Belva Crome, MD sent at 05/04/2014  8:56 AM EDT ----- Heart squeeze (EF) is normal. The heart is moderately thicker than normal which accounts for the enlarged heart. Management of this problem is weight loss, and excellent blood pressure control. Heart goal should be to keep the pressure less than 135/90 mmHg

## 2014-05-05 NOTE — Telephone Encounter (Signed)
Pt aware of echo results. Heart squeeze (EF) is normal. The heart is moderately thicker than normal which accounts for the enlarged heart. Management of this problem is weight loss, and excellent blood pressure control. Heart goal should be to keep the pressure less than 135/90 mmHg  Pt verbalized understanding.

## 2014-05-14 DIAGNOSIS — M7071 Other bursitis of hip, right hip: Secondary | ICD-10-CM | POA: Diagnosis not present

## 2014-05-14 DIAGNOSIS — M7551 Bursitis of right shoulder: Secondary | ICD-10-CM | POA: Diagnosis not present

## 2014-05-26 DIAGNOSIS — I129 Hypertensive chronic kidney disease with stage 1 through stage 4 chronic kidney disease, or unspecified chronic kidney disease: Secondary | ICD-10-CM | POA: Diagnosis not present

## 2014-05-26 DIAGNOSIS — N183 Chronic kidney disease, stage 3 (moderate): Secondary | ICD-10-CM | POA: Diagnosis not present

## 2014-06-09 DIAGNOSIS — N183 Chronic kidney disease, stage 3 (moderate): Secondary | ICD-10-CM | POA: Diagnosis not present

## 2014-06-13 DIAGNOSIS — M961 Postlaminectomy syndrome, not elsewhere classified: Secondary | ICD-10-CM | POA: Diagnosis not present

## 2014-06-13 DIAGNOSIS — M545 Low back pain: Secondary | ICD-10-CM | POA: Diagnosis not present

## 2014-06-13 DIAGNOSIS — M5417 Radiculopathy, lumbosacral region: Secondary | ICD-10-CM | POA: Diagnosis not present

## 2014-06-13 DIAGNOSIS — Z79891 Long term (current) use of opiate analgesic: Secondary | ICD-10-CM | POA: Diagnosis not present

## 2014-06-13 DIAGNOSIS — G894 Chronic pain syndrome: Secondary | ICD-10-CM | POA: Diagnosis not present

## 2014-06-15 DIAGNOSIS — G4733 Obstructive sleep apnea (adult) (pediatric): Secondary | ICD-10-CM | POA: Diagnosis not present

## 2014-06-15 DIAGNOSIS — E1142 Type 2 diabetes mellitus with diabetic polyneuropathy: Secondary | ICD-10-CM | POA: Diagnosis not present

## 2014-07-06 DIAGNOSIS — E1121 Type 2 diabetes mellitus with diabetic nephropathy: Secondary | ICD-10-CM | POA: Diagnosis not present

## 2014-07-06 DIAGNOSIS — I10 Essential (primary) hypertension: Secondary | ICD-10-CM | POA: Diagnosis not present

## 2014-07-06 DIAGNOSIS — I509 Heart failure, unspecified: Secondary | ICD-10-CM | POA: Diagnosis not present

## 2014-07-06 DIAGNOSIS — E039 Hypothyroidism, unspecified: Secondary | ICD-10-CM | POA: Diagnosis not present

## 2014-07-11 ENCOUNTER — Other Ambulatory Visit: Payer: Self-pay

## 2014-07-11 DIAGNOSIS — Z1231 Encounter for screening mammogram for malignant neoplasm of breast: Secondary | ICD-10-CM

## 2014-07-16 DIAGNOSIS — G4733 Obstructive sleep apnea (adult) (pediatric): Secondary | ICD-10-CM | POA: Diagnosis not present

## 2014-08-02 ENCOUNTER — Encounter: Payer: Self-pay | Admitting: Internal Medicine

## 2014-08-03 DIAGNOSIS — L259 Unspecified contact dermatitis, unspecified cause: Secondary | ICD-10-CM | POA: Diagnosis not present

## 2014-08-03 DIAGNOSIS — Z6841 Body Mass Index (BMI) 40.0 and over, adult: Secondary | ICD-10-CM | POA: Diagnosis not present

## 2014-08-03 DIAGNOSIS — E1121 Type 2 diabetes mellitus with diabetic nephropathy: Secondary | ICD-10-CM | POA: Diagnosis not present

## 2014-08-03 DIAGNOSIS — I10 Essential (primary) hypertension: Secondary | ICD-10-CM | POA: Diagnosis not present

## 2014-08-10 ENCOUNTER — Ambulatory Visit
Admission: RE | Admit: 2014-08-10 | Discharge: 2014-08-10 | Disposition: A | Discharge status: Home / Self Care | Payer: Medicare Other | Source: Ambulatory Visit

## 2014-08-10 DIAGNOSIS — Z1231 Encounter for screening mammogram for malignant neoplasm of breast: Secondary | ICD-10-CM | POA: Diagnosis not present

## 2014-08-15 ENCOUNTER — Ambulatory Visit (INDEPENDENT_AMBULATORY_CARE_PROVIDER_SITE_OTHER): Payer: Medicare Other | Admitting: Physician Assistant

## 2014-08-15 ENCOUNTER — Encounter: Payer: Self-pay | Admitting: Physician Assistant

## 2014-08-15 VITALS — BP 140/88 | HR 68 | Temp 97.8°F | Resp 18 | Wt 300.0 lb

## 2014-08-15 DIAGNOSIS — G4733 Obstructive sleep apnea (adult) (pediatric): Secondary | ICD-10-CM | POA: Diagnosis not present

## 2014-08-15 DIAGNOSIS — Z01419 Encounter for gynecological examination (general) (routine) without abnormal findings: Secondary | ICD-10-CM | POA: Diagnosis not present

## 2014-08-15 DIAGNOSIS — Z1239 Encounter for other screening for malignant neoplasm of breast: Secondary | ICD-10-CM | POA: Diagnosis not present

## 2014-08-15 NOTE — Progress Notes (Signed)
Patient ID: Tiffany [name] MRN: [id number], DOB: 1951/02/03, 64 y.o. Date of Encounter: 08/15/2014, 8:18 AM    Chief Complaint:  Chief Complaint  Patient presents with  . Annual Exam    PAP only     HPI: 64 y.o. year old AA female here for routine Gyn exam. She saw Dr. Ree Edman for her Gyn care in past. She last saw him in this office-last appt was 08/15/2009.  She saw me 07/23/2012 and 07/2013 for Gyn Exam.  She is here today for f/u annual Gyn Exam.  She has no active complaints or concerns today. Says she had Mammogram last week--was normal.    She goes to [facility] for her Primary Care/Internal Medicine Care.  She has no complaints today.  She had hysterectomy (no oophorectomy) in 1990s-secondary to heavy bleeding. Had no cancer. She has had breast reduction surgery. No other h/o breast disease. No breast cancer.  No other Gyn history. No family h/o breast, uterine, or ovarian cancer.    Home Meds:   Outpatient Prescriptions Prior to Visit  Medication Sig Dispense Refill  . allopurinol (ZYLOPRIM) 100 MG tablet Take 100 mg by mouth 2 (two) times daily.      Marland Kitchen amLODipine (NORVASC) 10 MG tablet Take 10 mg by mouth 2 (two) times daily.     Marland Kitchen aspirin 81 MG tablet Take 81 mg by mouth daily.      . cloNIDine (CATAPRES - DOSED IN MG/24 HR) 0.3 mg/24hr Place 1 patch onto the skin once a week. Patient needs to apply a new patch today     . HYDROcodone-acetaminophen (NORCO) 5-325 MG per tablet Take 1 tablet by mouth every 6 (six) hours as needed for moderate pain. 20 tablet 0  . insulin glargine (LANTUS) 100 UNIT/ML injection Inject 30 Units into the skin at bedtime. As directed    . levothyroxine (SYNTHROID, LEVOTHROID) 200 MCG tablet Take 200 mcg by mouth every other day. Alternating with 62mcg    . levothyroxine (SYNTHROID, LEVOTHROID) 25 MCG tablet 225 MCG every other day    . losartan (COZAAR) 25 MG tablet Take 50 mg by mouth daily.     . Multiple Vitamins-Minerals  (MULTIVITAMIN WITH MINERALS) tablet Take 1 tablet by mouth daily.    . rosuvastatin (CRESTOR) 10 MG tablet Take 10 mg by mouth daily.      Marland Kitchen topiramate (TOPAMAX) 25 MG tablet Take 25 mg by mouth at bedtime as needed.      . torsemide (DEMADEX) 100 MG tablet Take 50 mg by mouth every other day.     . pramoxine (SARNA SENSITIVE) 1 % LOTN Apply 1 application topically 2 (two) times daily. (Patient not taking: Reported on 08/15/2014) 118 mL 0   No facility-administered medications prior to visit.     Allergies: No Known Allergies    Review of Systems: See HPI for pertinent ROS. All other ROS negative.    Physical Exam: Blood pressure 140/88, pulse 68, temperature 97.8 F (36.6 C), temperature source Oral, resp. rate 18, weight 300 lb (136.079 kg)., Body mass index is 45.63 kg/(m^2). General:  Severely Obese AAF. Appears in no acute distress. Neck: Supple. No thyromegaly. No lymphadenopathy. Lungs: Clear bilaterally to auscultation without wheezes, rales, or rhonchi. Breathing is unlabored. Heart: Regular rhythm. II/VI murmur. Abdomen: Soft, non-tender, non-distended with normoactive bowel sounds. No hepatomegaly. No rebound/guarding. No obvious abdominal masses. Msk:  Strength and tone normal for age. Extremities/Skin: Warm and dry.  Neuro: Alert and oriented  X 3. Moves all extremities spontaneously. Gait is normal. CNII-XII grossly in tact. Psych:  Responds to questions appropriately with a normal affect. Breast Exam: Surgical scar at medial aspect of each breast. O/W breast exam normal bilaterally. No mass. Skin normal with no thickening, erythema. Nipples normal with no nipple discharge.  Pelvic Exam: Inspection is normal: External Genitalia normal with no lesions. Vaginal Mucosa normal. Bimanual exam normal, c/w hysterectomy. No adnexal mass. Exam is limited secondary to severe obesity.     ASSESSMENT AND PLAN:  64 y.o. year old female with  1. Screening breast  examination Normal [facility]Result reviewed in Epic today- Negative. Repeat one year  2. Encounter for routine pelvic examination Exam normal.  Guidelines state pap smear not indicated since she has had hysterectomy with no cancer.   3. DEXA: She reports she had this 2 years ago and it was normal. She had another DEXA -- I reviewed results in Epic--Done 03/31/13---Normal.     F/U in one year or sooner if needed.    Marin Olp [place], [place], [facility] 08/15/2014 8:18 AM

## 2014-08-19 DIAGNOSIS — E119 Type 2 diabetes mellitus without complications: Secondary | ICD-10-CM | POA: Diagnosis not present

## 2014-09-09 DIAGNOSIS — E1121 Type 2 diabetes mellitus with diabetic nephropathy: Secondary | ICD-10-CM | POA: Diagnosis not present

## 2014-09-09 DIAGNOSIS — Z1211 Encounter for screening for malignant neoplasm of colon: Secondary | ICD-10-CM | POA: Diagnosis not present

## 2014-09-09 DIAGNOSIS — I509 Heart failure, unspecified: Secondary | ICD-10-CM | POA: Diagnosis not present

## 2014-09-09 DIAGNOSIS — I10 Essential (primary) hypertension: Secondary | ICD-10-CM | POA: Diagnosis not present

## 2014-09-09 DIAGNOSIS — E039 Hypothyroidism, unspecified: Secondary | ICD-10-CM | POA: Diagnosis not present

## 2014-09-15 DIAGNOSIS — G4733 Obstructive sleep apnea (adult) (pediatric): Secondary | ICD-10-CM | POA: Diagnosis not present

## 2014-09-21 DIAGNOSIS — M19071 Primary osteoarthritis, right ankle and foot: Secondary | ICD-10-CM | POA: Diagnosis not present

## 2014-09-28 DIAGNOSIS — D120 Benign neoplasm of cecum: Secondary | ICD-10-CM | POA: Diagnosis not present

## 2014-09-28 DIAGNOSIS — D123 Benign neoplasm of transverse colon: Secondary | ICD-10-CM | POA: Diagnosis not present

## 2014-09-28 DIAGNOSIS — D126 Benign neoplasm of colon, unspecified: Secondary | ICD-10-CM | POA: Diagnosis not present

## 2014-09-28 DIAGNOSIS — Z1211 Encounter for screening for malignant neoplasm of colon: Secondary | ICD-10-CM | POA: Diagnosis not present

## 2014-10-12 DIAGNOSIS — E039 Hypothyroidism, unspecified: Secondary | ICD-10-CM | POA: Diagnosis not present

## 2014-10-12 DIAGNOSIS — E119 Type 2 diabetes mellitus without complications: Secondary | ICD-10-CM | POA: Diagnosis not present

## 2014-10-16 DIAGNOSIS — G4733 Obstructive sleep apnea (adult) (pediatric): Secondary | ICD-10-CM | POA: Diagnosis not present

## 2014-11-15 DIAGNOSIS — G4733 Obstructive sleep apnea (adult) (pediatric): Secondary | ICD-10-CM | POA: Diagnosis not present

## 2014-12-09 DIAGNOSIS — M25569 Pain in unspecified knee: Secondary | ICD-10-CM | POA: Diagnosis not present

## 2014-12-09 DIAGNOSIS — M179 Osteoarthritis of knee, unspecified: Secondary | ICD-10-CM | POA: Diagnosis not present

## 2014-12-09 DIAGNOSIS — I10 Essential (primary) hypertension: Secondary | ICD-10-CM | POA: Diagnosis not present

## 2014-12-09 DIAGNOSIS — E119 Type 2 diabetes mellitus without complications: Secondary | ICD-10-CM | POA: Diagnosis not present

## 2014-12-09 DIAGNOSIS — I509 Heart failure, unspecified: Secondary | ICD-10-CM | POA: Diagnosis not present

## 2014-12-09 DIAGNOSIS — E784 Other hyperlipidemia: Secondary | ICD-10-CM | POA: Diagnosis not present

## 2014-12-16 DIAGNOSIS — G4733 Obstructive sleep apnea (adult) (pediatric): Secondary | ICD-10-CM | POA: Diagnosis not present

## 2015-01-03 DIAGNOSIS — M545 Low back pain: Secondary | ICD-10-CM | POA: Diagnosis not present

## 2015-01-03 DIAGNOSIS — M5417 Radiculopathy, lumbosacral region: Secondary | ICD-10-CM | POA: Diagnosis not present

## 2015-01-03 DIAGNOSIS — M5137 Other intervertebral disc degeneration, lumbosacral region: Secondary | ICD-10-CM | POA: Diagnosis not present

## 2015-01-03 DIAGNOSIS — G894 Chronic pain syndrome: Secondary | ICD-10-CM | POA: Diagnosis not present

## 2015-01-03 DIAGNOSIS — M961 Postlaminectomy syndrome, not elsewhere classified: Secondary | ICD-10-CM | POA: Diagnosis not present

## 2015-01-06 DIAGNOSIS — E1121 Type 2 diabetes mellitus with diabetic nephropathy: Secondary | ICD-10-CM | POA: Diagnosis not present

## 2015-01-06 DIAGNOSIS — E784 Other hyperlipidemia: Secondary | ICD-10-CM | POA: Diagnosis not present

## 2015-01-06 DIAGNOSIS — Z1389 Encounter for screening for other disorder: Secondary | ICD-10-CM | POA: Diagnosis not present

## 2015-01-06 DIAGNOSIS — Z23 Encounter for immunization: Secondary | ICD-10-CM | POA: Diagnosis not present

## 2015-01-06 DIAGNOSIS — E039 Hypothyroidism, unspecified: Secondary | ICD-10-CM | POA: Diagnosis not present

## 2015-01-06 DIAGNOSIS — I10 Essential (primary) hypertension: Secondary | ICD-10-CM | POA: Diagnosis not present

## 2015-01-11 DIAGNOSIS — N183 Chronic kidney disease, stage 3 (moderate): Secondary | ICD-10-CM | POA: Diagnosis not present

## 2015-01-11 DIAGNOSIS — I129 Hypertensive chronic kidney disease with stage 1 through stage 4 chronic kidney disease, or unspecified chronic kidney disease: Secondary | ICD-10-CM | POA: Diagnosis not present

## 2015-01-15 DIAGNOSIS — G4733 Obstructive sleep apnea (adult) (pediatric): Secondary | ICD-10-CM | POA: Diagnosis not present

## 2015-04-05 ENCOUNTER — Ambulatory Visit: Payer: Medicare Other | Admitting: Internal Medicine

## 2015-04-06 ENCOUNTER — Encounter: Payer: Self-pay | Admitting: Internal Medicine

## 2015-04-06 ENCOUNTER — Ambulatory Visit (INDEPENDENT_AMBULATORY_CARE_PROVIDER_SITE_OTHER): Payer: Medicare Other | Admitting: Internal Medicine

## 2015-04-06 VITALS — BP 124/82 | HR 67 | Ht 70.0 in | Wt 309.8 lb

## 2015-04-06 DIAGNOSIS — J309 Allergic rhinitis, unspecified: Secondary | ICD-10-CM | POA: Diagnosis not present

## 2015-04-06 DIAGNOSIS — G4733 Obstructive sleep apnea (adult) (pediatric): Secondary | ICD-10-CM | POA: Diagnosis not present

## 2015-04-06 DIAGNOSIS — J3089 Other allergic rhinitis: Secondary | ICD-10-CM

## 2015-04-06 DIAGNOSIS — J302 Other seasonal allergic rhinitis: Secondary | ICD-10-CM

## 2015-04-06 NOTE — Assessment & Plan Note (Signed)
She is quite comfortable with CPAP. Download confirms excellent compliance and control. She reports better sleep.

## 2015-04-06 NOTE — Patient Instructions (Signed)
Order- DME [facility]- replacement CPAP mask of choice and supplies as needed   Dx OSA

## 2015-04-06 NOTE — Progress Notes (Signed)
Subjective:    Patient ID: Tiffany [name], female    DOB: [birth year], 65 y.o.   MRN: [id number]  HPI 06/14/10- 60 yoF followed for allergic rhinitis, Hx sleep apnea, complicated by obesity.  Last here May 02, 2009.  She has continued to use CPAP successfully, all night, every night at 10 cwp/ [facility]. We discussed recent study on CPAP use and health. Got through peak pollen season with modest nasal congestion and watery eyes. Claritin was sufficient this year. Never has had asthma.   06/14/11-  46 yoF followed for allergic rhinitis, Hx sleep apnea, complicated by obesity, HTN, hypothyroid, DM.  Wears CPAP every night for approx 6 hours; no more than usual troubles with allergies at this time. Antihistamines are sufficient CPAP 10/[facility]. Still aware of some daytime sleepiness but acceptable. Complicating issues of hypertension, obesity and hypothyroidism are being managed by her physicians.  08/14/12- 22 yoF followed for allergic rhinitis, Hx sleep apnea, complicated by obesity, HTN, hypothyroid, DM.  FOLLOWS FOR: reports not wearing her CPAP since last ov > "it was off when I woke up so I just stopped wearing it".  Reports doing well with allergies > "they didn't bother me much this year."  No new complaints. Feels no different. Lives alone- no one to comment about snoring. No longer needs sleeping pills. Mild allergy season for her, didn't need Rx.  04/04/14- 60 yoF followed for allergic rhinitis, Hx sleep apnea, complicated by obesity, HTN, hypothyroid, DM.  FOLLOWS FOR: sleep apnea DME [facility]. Pt reports machine broke last week. She had been wearing cpap 10 [facility]  6 hours nightly.. She needs new equipment.  04/06/2015-65 year old female never smoker followed for allergic rhinitis, OSA, complicated by obesity, HTN, hypothyroid, DM CPAP 10/[facility] FOLLOWS FOR: Wears CPAP nightly. Denies problems with mask/pressure. DME: [facility]  Download confirms her report of good compliance and  comfortable control. She sleeps better with CPAP. This machine is about 64 year old. No active respiratory symptoms currently.  ROS-see HPI Constitutional:   No-   weight loss, night sweats, fevers, chills, fatigue, lassitude. HEENT:   No-  headaches, difficulty swallowing, tooth/dental problems, sore throat,       No-  sneezing, itching, ear ache, nasal congestion, post nasal drip,  CV:  No-   chest pain, orthopnea, PND, swelling in lower extremities, anasarca,  dizziness, palpitations Resp: No-   shortness of breath with exertion or at rest.              No-   productive cough,  No non-productive cough,  No- coughing up of blood.              No-   change in color of mucus.  No- wheezing.   Skin: No-   rash or lesions. GI:  No-   heartburn, indigestion, abdominal pain, nausea, vomiting,  GU:  MS:  No-   joint pain or swelling.   Neuro-     nothing unusual Psych:  No- change in mood or affect. No depression or anxiety.  No memory loss.  OBJ- Physical Exam weight today is 309 pounds General- Alert, Oriented, Affect-appropriate, Distress- none acute, +obese Skin- rash-none, lesions- none, excoriation- none Lymphadenopathy- none Head- atraumatic            Eyes- Gross vision intact, PERRLA, conjunctivae and secretions clear, +strabismus            Ears- Hearing, canals-normal            Nose- Clear, no-Septal  dev, mucus, polyps, erosion, perforation             Throat- Mallampati III , mucosa clear , drainage- none, tonsils- atrophic Neck- flexible , trachea midline, no stridor , thyroid nl, carotid no bruit Chest - symmetrical excursion , unlabored           Heart/CV- RRR , 1/6 systolic murmur RUSB , no gallop  , no rub, nl s1 s2                           - JVD- none , edema- none, stasis changes- none, varices- none           Lung- clear to P&A, wheeze- none, cough- none , dullness-none, rub- none           Chest wall-  Abd-  Br/ Gen/ Rectal- Not done, not indicated Extrem-  cyanosis- none, clubbing, none, atrophy- none, strength- nl Neuro- grossly intact to observation

## 2015-04-06 NOTE — Assessment & Plan Note (Signed)
Not yet noticing much seasonal pollen exacerbation. We discussed use of antihistamines and nasal sprays should symptoms develop

## 2015-04-18 ENCOUNTER — Encounter: Payer: Self-pay | Admitting: Internal Medicine

## 2015-05-04 ENCOUNTER — Telehealth: Payer: Self-pay | Admitting: Internal Medicine

## 2015-05-04 NOTE — Telephone Encounter (Signed)
Order was sent to [facility] 04/06/15 w/ confirmation from [facility] in epic the order was received. LMTCB x1 for pt

## 2015-05-05 NOTE — Telephone Encounter (Signed)
LMOM TCB x2

## 2015-05-08 NOTE — Telephone Encounter (Signed)
LMTCB

## 2015-05-08 NOTE — Telephone Encounter (Signed)
Pt returned called.Tiffany [name] ° °

## 2015-05-09 NOTE — Telephone Encounter (Signed)
Spoke with pt, checking on status of cpap supplies.  Pt states she called [facility] to check status of supplies, and [facility] told her that they needed to contact our office for further info.   Called [facility], states that they have received everything they need from our office-will go ahead and place supplies order.  [facility] will be reaching out to patient today to verify order.  Nothing further needed.

## 2015-05-09 NOTE — Telephone Encounter (Signed)
[phone], pt cb

## 2015-05-17 ENCOUNTER — Telehealth: Payer: Self-pay | Admitting: Internal Medicine

## 2015-05-17 DIAGNOSIS — G4733 Obstructive sleep apnea (adult) (pediatric): Secondary | ICD-10-CM

## 2015-05-17 NOTE — Telephone Encounter (Signed)
Spoke with pt. States that she has received her CPAP supplies from [facility]. She called [facility] and they told her that they would get these out to her and she still has not received them. Another order will be placed. Advised the pt to let us know if she does not receive her supplies. Nothing further was needed.

## 2015-05-21 ENCOUNTER — Other Ambulatory Visit: Payer: Self-pay | Admitting: Internal Medicine

## 2015-05-23 NOTE — Telephone Encounter (Signed)
CY Please advise on refill. Thanks.  

## 2015-05-24 NOTE — Telephone Encounter (Signed)
Ok to refill 6 months 

## 2015-07-04 ENCOUNTER — Other Ambulatory Visit: Payer: Self-pay

## 2015-07-04 DIAGNOSIS — Z1231 Encounter for screening mammogram for malignant neoplasm of breast: Secondary | ICD-10-CM

## 2015-07-04 DIAGNOSIS — Z9889 Other specified postprocedural states: Secondary | ICD-10-CM

## 2015-08-11 ENCOUNTER — Ambulatory Visit
Admission: RE | Admit: 2015-08-11 | Discharge: 2015-08-11 | Disposition: A | Discharge status: Home / Self Care | Payer: Medicare Other | Source: Ambulatory Visit

## 2015-08-11 DIAGNOSIS — Z1231 Encounter for screening mammogram for malignant neoplasm of breast: Secondary | ICD-10-CM

## 2015-08-11 DIAGNOSIS — Z9889 Other specified postprocedural states: Secondary | ICD-10-CM

## 2015-08-17 ENCOUNTER — Ambulatory Visit (INDEPENDENT_AMBULATORY_CARE_PROVIDER_SITE_OTHER): Payer: Medicare Other | Admitting: Physician Assistant

## 2015-08-17 ENCOUNTER — Encounter: Payer: Self-pay | Admitting: Physician Assistant

## 2015-08-17 VITALS — BP 140/96 | HR 64 | Temp 97.6°F | Resp 18 | Ht 70.0 in | Wt 304.0 lb

## 2015-08-17 DIAGNOSIS — Z01419 Encounter for gynecological examination (general) (routine) without abnormal findings: Secondary | ICD-10-CM

## 2015-08-17 DIAGNOSIS — Z1239 Encounter for other screening for malignant neoplasm of breast: Secondary | ICD-10-CM

## 2015-08-17 NOTE — Progress Notes (Signed)
Patient ID: Tiffany [name] MRN: [id number], DOB: 04-May-1950, 65 y.o. Date of Encounter: 08/17/2015, 8:23 AM    Chief Complaint:  Chief Complaint  Patient presents with  . PAP only    needs PAP former pt Dr Ree Edman     HPI: 65 y.o. year old AA female here for routine Gyn exam. She saw Dr. Ree Edman for her Gyn care in past. She last saw him in this office-last appt was 08/15/2009.  She saw me 07/23/2012 and 07/2013 and 08/15/2014 for Gyn Exam.  She is here today for f/u annual Gyn Exam.  She has no active complaints or concerns today. Says she had Mammogram last week--was normal.    She goes to [facility] for her Primary Care/Internal Medicine Care.  She has no complaints today.  She had hysterectomy (no oophorectomy) in 1990s-secondary to heavy bleeding. Had no cancer. She has had breast reduction surgery. No other h/o breast disease. No breast cancer.  No other Gyn history. No family h/o breast, uterine, or ovarian cancer.    Home Meds:   Outpatient Prescriptions Prior to Visit  Medication Sig Dispense Refill  . allopurinol (ZYLOPRIM) 100 MG tablet Take 100 mg by mouth 2 (two) times daily.      Marland Kitchen amLODipine (NORVASC) 10 MG tablet Take 10 mg by mouth 2 (two) times daily.     Marland Kitchen aspirin 81 MG tablet Take 81 mg by mouth daily.      . cloNIDine (CATAPRES - DOSED IN MG/24 HR) 0.3 mg/24hr Place 1 patch onto the skin once a week. Patient needs to apply a new patch today     . HYDROcodone-acetaminophen (NORCO) 5-325 MG per tablet Take 1 tablet by mouth every 6 (six) hours as needed for moderate pain. 20 tablet 0  . insulin glargine (LANTUS) 100 UNIT/ML injection Inject 30 Units into the skin at bedtime. As directed    . levothyroxine (SYNTHROID, LEVOTHROID) 200 MCG tablet Take 200 mcg by mouth every other day. Alternating with 58mcg    . levothyroxine (SYNTHROID, LEVOTHROID) 25 MCG tablet 225 MCG every other day    . Multiple Vitamins-Minerals (MULTIVITAMIN WITH MINERALS) tablet  Take 1 tablet by mouth daily.    . rosuvastatin (CRESTOR) 10 MG tablet Take 10 mg by mouth daily.      Marland Kitchen topiramate (TOPAMAX) 25 MG tablet Take 25 mg by mouth at bedtime as needed.      . zolpidem (AMBIEN) 10 MG tablet TAKE 1 TABLET AT BEDTIME AS NEEDED FOR SLEEP 30 tablet 5  . losartan (COZAAR) 25 MG tablet Take 50 mg by mouth daily.     Marland Kitchen torsemide (DEMADEX) 100 MG tablet Take 50 mg by mouth every other day.      No facility-administered medications prior to visit.     Allergies: No Known Allergies    Review of Systems: See HPI for pertinent ROS. All other ROS negative.    Physical Exam: Blood pressure 140/96, pulse 64, temperature 97.6 F (36.4 C), temperature source Oral, resp. rate 18, height 5\' 10"  (1.778 m), weight 304 lb (137.893 kg)., Body mass index is 43.62 kg/(m^2). General:  Severely Obese AAF. Appears in no acute distress. Neck: Supple. No thyromegaly. No lymphadenopathy. Lungs: Clear bilaterally to auscultation without wheezes, rales, or rhonchi. Breathing is unlabored. Heart: Regular rhythm. II/VI murmur. Abdomen: Soft, non-tender, non-distended with normoactive bowel sounds. No hepatomegaly. No rebound/guarding. No obvious abdominal masses. Msk:  Strength and tone normal for age. Extremities/Skin: Warm and dry.  Neuro: Alert and oriented X 3. Moves all extremities spontaneously. Gait is normal. CNII-XII grossly in tact. Psych:  Responds to questions appropriately with a normal affect. Breast Exam: Surgical scar at medial aspect of each breast. O/W breast exam normal bilaterally. No mass. Skin normal with no thickening, erythema. Nipples normal with no nipple discharge.  Pelvic Exam: Inspection is normal: External Genitalia normal with no lesions. Vaginal Mucosa normal. Bimanual exam normal, c/w hysterectomy. No adnexal mass. Exam is limited secondary to severe obesity.     ASSESSMENT AND PLAN:  65 y.o. year old female with  1. Screening breast  examination Normal [facility]--Result reviewed in Epic today- Negative. Repeat one year  2. Encounter for routine pelvic examination Exam normal.  Guidelines state pap smear not indicated since she has had hysterectomy with no cancer.   3. DEXA: She reports she had this 2 years ago and it was normal. She had another DEXA -- I reviewed results in Epic--Done 03/31/13---Normal.     F/U in one year or sooner if needed.    Marin Olp [place], [place], [facility] 08/17/2015 8:23 AM

## 2015-08-25 ENCOUNTER — Ambulatory Visit (INDEPENDENT_AMBULATORY_CARE_PROVIDER_SITE_OTHER): Payer: Medicare Other | Admitting: Interventional Cardiology

## 2015-08-25 ENCOUNTER — Encounter: Payer: Self-pay | Admitting: Interventional Cardiology

## 2015-08-25 VITALS — BP 166/84 | HR 69 | Ht 70.0 in | Wt 299.2 lb

## 2015-08-25 DIAGNOSIS — N183 Chronic kidney disease, stage 3 unspecified: Secondary | ICD-10-CM

## 2015-08-25 DIAGNOSIS — I10 Essential (primary) hypertension: Secondary | ICD-10-CM

## 2015-08-25 DIAGNOSIS — G4733 Obstructive sleep apnea (adult) (pediatric): Secondary | ICD-10-CM

## 2015-08-25 DIAGNOSIS — I5032 Chronic diastolic (congestive) heart failure: Secondary | ICD-10-CM | POA: Diagnosis not present

## 2015-08-25 NOTE — Progress Notes (Signed)
Cardiology Office Note    Date:  08/25/2015   ID:  Tiffany [name], DOB 12/14/1950, MRN [id number]  PCP:  [facility]  Cardiologist: Sinclair Grooms, MD   Chief Complaint  Patient presents with  . Shortness of Breath    History of Present Illness:  Tiffany [name] is a 65 y.o. female who presents for f/u of  diastolic heart failure, hypertension, obesity, and also a background history of CK D stage III. She denies orthopnea PND. No episodes of syncope or prolonged palpitation. She is concerned about a history of enlarged hear  There are no specific cardiac complaints. She is limited in physical activity because of arthritis and foot pain. She has not had syncope, chest pain, orthopnea, PND, or ankle edema.  Past Medical History  Diagnosis Date  . S/P arthroscopy   . Obesity   . Hypothyroid   . Previous back surgery     x 2  . Sleep apnea   . ALLERGIC RHINITIS   . Hypertension   . Diabetes mellitus   . Kidney disease     Past Surgical History  Procedure Laterality Date  . Breast biopsy    . Breast reduction surgery  1982  . Lumbar disc surgery      x 2  . Knee surgery      x 2  . Toe surgery    . Treatment fistula anal    . Total abdominal hysterectomy  1997    partial hysterectomy- still has ovaries    Current Medications: Outpatient Prescriptions Prior to Visit  Medication Sig Dispense Refill  . allopurinol (ZYLOPRIM) 100 MG tablet Take 100 mg by mouth 2 (two) times daily.      Marland Kitchen amLODipine (NORVASC) 10 MG tablet Take 10 mg by mouth 2 (two) times daily.     Marland Kitchen aspirin 81 MG tablet Take 81 mg by mouth daily.      . B-D INS SYRINGE 0.5CC/30GX1/2" 30G X 1/2" 0.5 ML MISC TAKE AS DIRECTED TWICE A DAY  3  . BIOTIN 5000 PO Take by mouth daily.    . cetirizine (ZYRTEC) 10 MG tablet TAKE 1 TABLET EVERY DAY AS NEEDED  5  . cloNIDine (CATAPRES - DOSED IN MG/24 HR) 0.3 mg/24hr Place 1 patch onto the skin once a week. Patient needs to apply a new patch today     .  HYDROcodone-acetaminophen (NORCO) 5-325 MG per tablet Take 1 tablet by mouth every 6 (six) hours as needed for moderate pain. 20 tablet 0  . insulin glargine (LANTUS) 100 UNIT/ML injection Inject 30 Units into the skin at bedtime. As directed    . levothyroxine (SYNTHROID, LEVOTHROID) 200 MCG tablet Take 200 mcg by mouth every other day. Alternating with 29mcg    . levothyroxine (SYNTHROID, LEVOTHROID) 25 MCG tablet 225 MCG every other day    . losartan (COZAAR) 50 MG tablet Take 50 mg by mouth daily.  2  . Multiple Vitamins-Minerals (MULTIVITAMIN WITH MINERALS) tablet Take 1 tablet by mouth daily.    . Multiple Vitamins-Minerals (VISION-VITE PRESERVE PO) Take by mouth 2 (two) times daily.    Marland Kitchen PROAIR HFA 108 (90 Base) MCG/ACT inhaler INHALE 1-2 PUFFS INTO THE LUNGS EVERY 8 HOURS AS NEEDED FOR WHEEZING OR SHORTNESS OF BREATH  5  . rosuvastatin (CRESTOR) 10 MG tablet Take 10 mg by mouth daily.      Marland Kitchen topiramate (TOPAMAX) 25 MG tablet Take 25 mg by mouth at bedtime  as needed.      . torsemide (DEMADEX) 100 MG tablet Take 50 mg by mouth daily.    Marland Kitchen zolpidem (AMBIEN) 10 MG tablet TAKE 1 TABLET AT BEDTIME AS NEEDED FOR SLEEP 30 tablet 5   No facility-administered medications prior to visit.     Allergies:   Review of patient's allergies indicates no known allergies.   Social History   Social History  . Marital Status: Single    Spouse Name: N/A  . Number of Children: N/A  . Years of Education: N/A   Occupational History  . school bus driver    Social History Main Topics  . Smoking status: Never Smoker   . Smokeless tobacco: Not on file  . Alcohol Use: Yes     Comment: occasional  . Drug Use: No  . Sexual Activity: Yes    Birth Control/ Protection: Surgical   Other Topics Concern  . Not on file   Social History Narrative     Family History:  The patient's family history includes Congestive Heart Failure in her mother; Diabetes in her sister; Other in her sister; Prostate cancer  in her father.   ROS:   Please see the history of present illness.    Occasional wheezing, low back discomfort, foot pain, some difficulty with vision  All other systems reviewed and are negative.   PHYSICAL EXAM:   VS:  There were no vitals taken for this visit.   GEN: Well nourished, well developed, in no acute distress HEENT: normal Neck: no JVD, carotid bruits, or masses Cardiac: RRR; no murmurs, rubs,no edema . An S4 gallop is present. Respiratory:  clear to auscultation bilaterally, normal work of breathing GI: soft, nontender, nondistended, + BS MS: no deformity or atrophy Skin: warm and dry, no rash Neuro:  Alert and Oriented x 3, Strength and sensation are intact Psych: euthymic mood, full affect  Wt Readings from Last 3 Encounters:  08/17/15 304 lb (137.893 kg)  04/06/15 309 lb 12.8 oz (140.524 kg)  08/15/14 300 lb (136.079 kg)      Studies/Labs Reviewed:   EKG:  EKG  Sinus rhythm at 69 bpm, left atrial abnormality, poor R-wave progression, otherwise normal.  Recent Labs: No results found for requested labs within last 365 days.   Lipid Panel No results found for: CHOL, TRIG, HDL, CHOLHDL, VLDL, LDLCALC, LDLDIRECT  Additional studies/ records that were reviewed today include:  Echocardiogram, March 2016:  Study Conclusions  - Left ventricle: The cavity size was normal. Wall thickness was increased in a pattern of moderate LVH. Systolic function was normal. The estimated ejection fraction was in the range of 60% to 65%. Wall motion was normal; there were no regional wall motion abnormalities. Features are consistent with a pseudonormal left ventricular filling pattern, with concomitant abnormal relaxation and increased filling pressure (grade 2 diastolic dysfunction). - Mitral valve: There was mild regurgitation.   ASSESSMENT:    1. Essential hypertension   2. Chronic diastolic heart failure ([place])   3. Obstructive sleep apnea   4. CKD  (chronic kidney disease), stage III      PLAN:  In order of problems listed above:  1. Low salt diet, weight reduction, and aerobic exercises encouraged. Blood pressure target is 130/90 mmHg. This was reinforced. 2. Known diastolic dysfunction without evidence of volume overload. Same recommendations as #1 above. 3. Comply with C Pap 4. Not addressed    Medication Adjustments/Labs and Tests Ordered: Current medicines are reviewed at length with  the patient today.  Concerns regarding medicines are outlined above.  Medication changes, Labs and Tests ordered today are listed in the Patient Instructions below. There are no Patient Instructions on file for this visit.   Signed, Sinclair Grooms, MD  08/25/2015 8:14 AM    [facility] [place], [place], [place]  [address] Phone: [phone]; Fax: [phone]

## 2015-08-25 NOTE — Patient Instructions (Signed)
Medication Instructions:  Your physician recommends that you continue on your current medications as directed. Please refer to the Current Medication list given to you today.   Labwork: None ordered  Testing/Procedures: None ordered  Follow-Up: Your physician wants you to follow-up in: 1 year with Dr.Smith You will receive a reminder letter in the mail two months in advance. If you don't receive a letter, please call our office to schedule the follow-up appointment.   Any Other Special Instructions Will Be Listed Below (If Applicable). Please adhere to a low sodium diet  Your physician encouraged you to lose weight for better health.  Your physician discussed the importance of regular exercise and recommended that you start or continue a regular exercise program for good health.      If you need a refill on your cardiac medications before your next appointment, please call your pharmacy.

## 2015-09-15 ENCOUNTER — Encounter (HOSPITAL_COMMUNITY): Payer: Self-pay

## 2015-09-15 ENCOUNTER — Ambulatory Visit (HOSPITAL_COMMUNITY)
Admission: EM | Admit: 2015-09-15 | Discharge: 2015-09-15 | Disposition: A | Discharge status: Home / Self Care | Payer: Medicare Other | Attending: Family Medicine | Admitting: Family Medicine

## 2015-09-15 DIAGNOSIS — H109 Unspecified conjunctivitis: Secondary | ICD-10-CM

## 2015-09-15 MED ORDER — CIPROFLOXACIN HCL 0.3 % OP OINT: TOPICAL_OINTMENT | OPHTHALMIC | 0 refills | Status: DC

## 2015-09-15 NOTE — ED Provider Notes (Signed)
CSN: [id number]     Arrival date & time 09/15/15  1719 History   None    Chief Complaint  Patient presents with  . Eye Pain   (Consider location/radiation/quality/duration/timing/severity/associated sxs/prior Treatment) The history is provided by the patient. No language interpreter was used.  Eye Pain  This is a new problem. The current episode started 2 days ago. The problem occurs constantly. The problem has been gradually worsening. Pertinent negatives include no headaches. Nothing aggravates the symptoms. Nothing relieves the symptoms. She has tried nothing for the symptoms.  Pt complains of pain in her right eye. Pt reports drainage and caking.  Pt reports eyelids feel sticky  Past Medical History:  Diagnosis Date  . ALLERGIC RHINITIS   . Diabetes mellitus   . Hypertension   . Hypothyroid   . Kidney disease   . Obesity   . Previous back surgery    x 2  . S/P arthroscopy   . Sleep apnea    Past Surgical History:  Procedure Laterality Date  . BREAST BIOPSY    . BREAST REDUCTION SURGERY  1982  . KNEE SURGERY     x 2  . LUMBAR DISC SURGERY     x 2  . TOE SURGERY    . TOTAL ABDOMINAL HYSTERECTOMY  1997   partial hysterectomy- still has ovaries  . TREATMENT FISTULA ANAL     Family History  Problem Relation Age of Onset  . Prostate cancer Father   . Congestive Heart Failure Mother   . Diabetes Sister   . Other Sister     septis   Social History  Substance Use Topics  . Smoking status: Never Smoker  . Smokeless tobacco: Never Used  . Alcohol use 0.0 oz/week     Comment: occasional   OB History    No data available     Review of Systems  Eyes: Positive for pain.  Neurological: Negative for headaches.  All other systems reviewed and are negative.   Allergies  Review of patient's allergies indicates no known allergies.  Home Medications   Prior to Admission medications   Medication Sig Start Date End Date Taking? Authorizing Provider  allopurinol  (ZYLOPRIM) 100 MG tablet Take 100 mg by mouth 2 (two) times daily.     Yes Historical Provider, MD  amLODipine (NORVASC) 10 MG tablet Take 10 mg by mouth 2 (two) times daily.    Yes Historical Provider, MD  aspirin 81 MG tablet Take 81 mg by mouth daily.     Yes Historical Provider, MD  B Complex Vitamins (B COMPLEX PO) Take 1 tablet by mouth daily.   Yes Historical Provider, MD  cloNIDine (CATAPRES - DOSED IN MG/24 HR) 0.3 mg/24hr Place 1 patch onto the skin once a week. Patient needs to apply a new patch today    Yes Historical Provider, MD  insulin glargine (LANTUS) 100 UNIT/ML injection Inject 30 Units into the skin at bedtime. As directed   Yes Historical Provider, MD  levothyroxine (SYNTHROID, LEVOTHROID) 200 MCG tablet Take 200 mcg by mouth every other day. Take one tablet by mouth every other day with levothyroxine 25 mcg to equal 225 mcg total.   Yes Historical Provider, MD  levothyroxine (SYNTHROID, LEVOTHROID) 25 MCG tablet Take one tablet by mouth every other day with levothyroxine 200 mcg to equal 225 mcg total.   Yes Historical Provider, MD  losartan (COZAAR) 100 MG tablet Take 100 mg by mouth daily. 08/18/15  Yes Historical Provider,  MD  Multiple Vitamins-Minerals (MULTIVITAMIN WITH MINERALS) tablet Take 1 tablet by mouth daily.   Yes Historical Provider, MD  Multiple Vitamins-Minerals (VISION-VITE PRESERVE PO) Take by mouth 2 (two) times daily.   Yes Historical Provider, MD  PROAIR HFA 108 (90 Base) MCG/ACT inhaler INHALE 1-2 PUFFS INTO THE LUNGS EVERY 8 HOURS AS NEEDED FOR WHEEZING OR SHORTNESS OF BREATH 07/05/15  Yes Historical Provider, MD  rosuvastatin (CRESTOR) 10 MG tablet Take 10 mg by mouth daily.     Yes Historical Provider, MD  torsemide (DEMADEX) 100 MG tablet Take 50 mg by mouth daily.   Yes Historical Provider, MD  B-D INS SYRINGE 0.5CC/30GX1/2" 30G X 1/2" 0.5 ML MISC TAKE AS DIRECTED TWICE A DAY 08/12/15   Historical Provider, MD  BIOTIN 5000 PO Take 1 capsule by mouth daily.      Historical Provider, MD  cetirizine (ZYRTEC) 10 MG tablet Take 10 mg by mouth daily as needed for allergies.    Historical Provider, MD  HYDROcodone-acetaminophen (VICODIN) 5-500 MG tablet Take 1 tablet by mouth every 6 (six) hours as needed for pain.    Historical Provider, MD  zolpidem (AMBIEN) 10 MG tablet Take 5 mg by mouth at bedtime as needed for sleep.    Historical Provider, MD   Meds Ordered and Administered this Visit  Medications - No data to display  BP 171/90 (BP Location: Right Arm)   Pulse 67   Temp 98.3 F (36.8 C) (Oral)   Resp 15   SpO2 98%  No data found.   Physical Exam  Constitutional: She appears well-developed and well-nourished.  HENT:  Right Ear: External ear normal.  Mouth/Throat: Oropharynx is clear and moist.  Eyes: EOM are normal. Pupils are equal, round, and reactive to light. Right eye exhibits discharge.  swelling from center to mid eyelid   Neck: Normal range of motion.  Cardiovascular: Normal rate.   Pulmonary/Chest: Effort normal.  Abdominal: Soft.  Nursing note and vitals reviewed.   Urgent Care Course   Clinical Course    Procedures (including critical care time)  Labs Review Labs Reviewed - No data to display  Imaging Review No results found.   Visual Acuity Review  Right Eye Distance:   Left Eye Distance:   Bilateral Distance:    Right Eye Near:   Left Eye Near:    Bilateral Near:         MDM   1. Conjunctivitis, right eye    Current Meds  Medication Sig  . allopurinol (ZYLOPRIM) 100 MG tablet Take 100 mg by mouth 2 (two) times daily.    Marland Kitchen amLODipine (NORVASC) 10 MG tablet Take 10 mg by mouth 2 (two) times daily.   Marland Kitchen aspirin 81 MG tablet Take 81 mg by mouth daily.    . B Complex Vitamins (B COMPLEX PO) Take 1 tablet by mouth daily.  . cloNIDine (CATAPRES - DOSED IN MG/24 HR) 0.3 mg/24hr Place 1 patch onto the skin once a week. Patient needs to apply a new patch today   . insulin glargine (LANTUS) 100  UNIT/ML injection Inject 30 Units into the skin at bedtime. As directed  . levothyroxine (SYNTHROID, LEVOTHROID) 200 MCG tablet Take 200 mcg by mouth every other day. Take one tablet by mouth every other day with levothyroxine 25 mcg to equal 225 mcg total.  . levothyroxine (SYNTHROID, LEVOTHROID) 25 MCG tablet Take one tablet by mouth every other day with levothyroxine 200 mcg to equal 225 mcg total.  .  losartan (COZAAR) 100 MG tablet Take 100 mg by mouth daily.  . Multiple Vitamins-Minerals (MULTIVITAMIN WITH MINERALS) tablet Take 1 tablet by mouth daily.  . Multiple Vitamins-Minerals (VISION-VITE PRESERVE PO) Take by mouth 2 (two) times daily.  Marland Kitchen PROAIR HFA 108 (90 Base) MCG/ACT inhaler INHALE 1-2 PUFFS INTO THE LUNGS EVERY 8 HOURS AS NEEDED FOR WHEEZING OR SHORTNESS OF BREATH  . rosuvastatin (CRESTOR) 10 MG tablet Take 10 mg by mouth daily.    Marland Kitchen torsemide (DEMADEX) 100 MG tablet Take 50 mg by mouth daily.   An After Visit Summary was printed and given to the patient.   Fransico Meadow, PA-C 09/15/15 1758

## 2015-09-15 NOTE — ED Triage Notes (Signed)
Patient presents to [facility] with complaint of rt eye pain x2-3 days, pt denies any injury relate to this pain No acute distress

## 2015-09-15 NOTE — Discharge Instructions (Signed)
See Dr. Posey Pronto for recheck on Monday if symptoms persist

## 2015-10-05 ENCOUNTER — Telehealth: Payer: Self-pay | Admitting: Internal Medicine

## 2015-10-05 NOTE — Telephone Encounter (Signed)
Spoke with pt. States that she needs a 6 month download from her CPAP machine for DMV purposes. She was going to bring her SD Card to [place] but she is enrolled in [place]. Advised her that I would print her report and leave it at the front desk for pick up. She was very Patent attorney. Report has been left at the front desk for pick up. Nothing further was needed.

## 2015-12-14 ENCOUNTER — Other Ambulatory Visit: Payer: Self-pay | Admitting: Internal Medicine

## 2015-12-14 DIAGNOSIS — E2839 Other primary ovarian failure: Secondary | ICD-10-CM

## 2016-01-12 ENCOUNTER — Ambulatory Visit (INDEPENDENT_AMBULATORY_CARE_PROVIDER_SITE_OTHER): Payer: Medicare Other

## 2016-01-12 ENCOUNTER — Encounter (HOSPITAL_COMMUNITY): Payer: Self-pay | Admitting: Emergency Medicine

## 2016-01-12 ENCOUNTER — Ambulatory Visit (HOSPITAL_COMMUNITY)
Admission: EM | Admit: 2016-01-12 | Discharge: 2016-01-12 | Disposition: A | Discharge status: Home / Self Care | Payer: Medicare Other | Attending: Family Medicine | Admitting: Family Medicine

## 2016-01-12 DIAGNOSIS — R062 Wheezing: Secondary | ICD-10-CM | POA: Diagnosis not present

## 2016-01-12 DIAGNOSIS — R0602 Shortness of breath: Secondary | ICD-10-CM | POA: Diagnosis not present

## 2016-01-12 DIAGNOSIS — J189 Pneumonia, unspecified organism: Secondary | ICD-10-CM

## 2016-01-12 DIAGNOSIS — J181 Lobar pneumonia, unspecified organism: Secondary | ICD-10-CM

## 2016-01-12 DIAGNOSIS — M545 Low back pain, unspecified: Secondary | ICD-10-CM

## 2016-01-12 LAB — POCT URINALYSIS DIP (DEVICE)
Bilirubin Urine: NEGATIVE
Glucose, UA: NEGATIVE mg/dL
Ketones, ur: NEGATIVE mg/dL
Nitrite: NEGATIVE
Protein, ur: >=300 mg/dL — AB
Specific Gravity, Urine: 1.02 (ref 1.005–1.030)
Urobilinogen, UA: 0.2 mg/dL (ref 0.0–1.0)
pH: 6.5 (ref 5.0–8.0)

## 2016-01-12 MED ORDER — PREDNISONE 20 MG PO TABS: 40.0000 mg | ORAL_TABLET | Freq: Every day | ORAL | 0 refills | Status: DC

## 2016-01-12 MED ORDER — PREDNISONE 20 MG PO TABS
60.0000 mg | ORAL_TABLET | Freq: Once | ORAL | Status: AC
  Administered 2016-01-12: 60 mg via ORAL

## 2016-01-12 MED ORDER — PREDNISONE 20 MG PO TABS
ORAL_TABLET | ORAL | Status: AC
  Filled 2016-01-12: qty 3

## 2016-01-12 MED ORDER — IPRATROPIUM-ALBUTEROL 0.5-2.5 (3) MG/3ML IN SOLN
3.0000 mL | Freq: Once | RESPIRATORY_TRACT | Status: AC
  Administered 2016-01-12: 3 mL via RESPIRATORY_TRACT

## 2016-01-12 MED ORDER — IPRATROPIUM-ALBUTEROL 0.5-2.5 (3) MG/3ML IN SOLN
RESPIRATORY_TRACT | Status: AC
  Filled 2016-01-12: qty 3

## 2016-01-12 MED ORDER — DOXYCYCLINE HYCLATE 100 MG PO CAPS: 100.0000 mg | ORAL_CAPSULE | Freq: Two times a day (BID) | ORAL | 0 refills | Status: DC

## 2016-01-12 MED ORDER — ALBUTEROL SULFATE (5 MG/ML) 0.5% IN NEBU: 2.5000 mg | INHALATION_SOLUTION | Freq: Four times a day (QID) | RESPIRATORY_TRACT | 12 refills | Status: DC | PRN

## 2016-01-12 NOTE — ED Provider Notes (Signed)
CSN: [id number]     Arrival date & time 01/12/16  1002 History   First MD Initiated Contact with Patient 01/12/16 1025     Chief Complaint  Patient presents with  . Cough  . Back Pain   (Consider location/radiation/quality/duration/timing/severity/associated sxs/prior Treatment) HPI Tiffany [name] is a 65 y.o. female presenting to UC with c/o intermittent dry cough with mild shortness of breath and wheeze.  She notes she has been using an inhaler that was prescribed to her around this same time last year.  She has used the inhaler for 1 week with mild relief.  Denies congestion or chest pain. Mildly scratchy throat.  Denies worsening leg swelling, notes she has chronic swelling in her legs due to stage 3 kidney failure.  She is still able to produce urine and adds she is concerned she may have a kidney infection as she has developed Left lower back pain that is localized.  She has a hx of back pain but notes that is typically all over and radiates into her legs, current pain is aching and sore, does not radiate. Denies fever, chills, n/v/d. Denies sick contacts or recent travel.   Hx of diastolic heart failure, f/u annually with her cardiologist in July of this year, notes "everything was good."  BP elevated in triage. Hx of HTN. She has been taking her medications as prescribed.  Past Medical History:  Diagnosis Date  . ALLERGIC RHINITIS   . Diabetes mellitus   . Hypertension   . Hypothyroid   . Kidney disease   . Obesity   . Previous back surgery    x 2  . S/P arthroscopy   . Sleep apnea    Past Surgical History:  Procedure Laterality Date  . BREAST BIOPSY    . BREAST REDUCTION SURGERY  1982  . KNEE SURGERY     x 2  . LUMBAR DISC SURGERY     x 2  . TOE SURGERY    . TOTAL ABDOMINAL HYSTERECTOMY  1997   partial hysterectomy- still has ovaries  . TREATMENT FISTULA ANAL     Family History  Problem Relation Age of Onset  . Prostate cancer Father   . Congestive Heart Failure  Mother   . Diabetes Sister   . Other Sister     septis   Social History  Substance Use Topics  . Smoking status: Never Smoker  . Smokeless tobacco: Never Used  . Alcohol use 0.0 oz/week     Comment: occasional   OB History    No data available     Review of Systems  Constitutional: Negative for chills and fever.  HENT: Positive for sore throat ( scratchy). Negative for congestion, ear pain, trouble swallowing and voice change.   Respiratory: Positive for cough, chest tightness, shortness of breath and wheezing.   Cardiovascular: Positive for leg swelling (bilateral, chronic). Negative for chest pain and palpitations.  Gastrointestinal: Negative for abdominal pain, diarrhea, nausea and vomiting.  Genitourinary: Positive for flank pain (Left). Negative for dysuria, frequency, hematuria, pelvic pain and urgency.  Musculoskeletal: Positive for back pain (Left lower). Negative for arthralgias and myalgias.  Skin: Negative for rash.    Allergies  Patient has no known allergies.  Home Medications   Prior to Admission medications   Medication Sig Start Date End Date Taking? Authorizing Provider  allopurinol (ZYLOPRIM) 100 MG tablet Take 100 mg by mouth 2 (two) times daily.     Yes Historical Provider, MD  amLODipine (  NORVASC) 10 MG tablet Take 10 mg by mouth 2 (two) times daily.    Yes Historical Provider, MD  aspirin 81 MG tablet Take 81 mg by mouth daily.     Yes Historical Provider, MD  B-D INS SYRINGE 0.5CC/30GX1/2" 30G X 1/2" 0.5 ML MISC TAKE AS DIRECTED TWICE A DAY 08/12/15  Yes Historical Provider, MD  cloNIDine (CATAPRES - DOSED IN MG/24 HR) 0.3 mg/24hr Place 1 patch onto the skin once a week. Patient needs to apply a new patch today    Yes Historical Provider, MD  insulin glargine (LANTUS) 100 UNIT/ML injection Inject 30 Units into the skin at bedtime. As directed   Yes Historical Provider, MD  levothyroxine (SYNTHROID, LEVOTHROID) 200 MCG tablet Take 200 mcg by mouth every  other day. Take one tablet by mouth every other day with levothyroxine 25 mcg to equal 225 mcg total.   Yes Historical Provider, MD  losartan (COZAAR) 100 MG tablet Take 100 mg by mouth daily. 08/18/15  Yes Historical Provider, MD  Multiple Vitamins-Minerals (MULTIVITAMIN WITH MINERALS) tablet Take 1 tablet by mouth daily.   Yes Historical Provider, MD  PROAIR HFA 108 (90 Base) MCG/ACT inhaler INHALE 1-2 PUFFS INTO THE LUNGS EVERY 8 HOURS AS NEEDED FOR WHEEZING OR SHORTNESS OF BREATH 07/05/15  Yes Historical Provider, MD  rosuvastatin (CRESTOR) 10 MG tablet Take 10 mg by mouth daily.     Yes Historical Provider, MD  torsemide (DEMADEX) 100 MG tablet Take 50 mg by mouth daily.   Yes Historical Provider, MD  zolpidem (AMBIEN) 10 MG tablet Take 5 mg by mouth at bedtime as needed for sleep.   Yes Historical Provider, MD  albuterol (PROVENTIL) (5 MG/ML) 0.5% nebulizer solution Take 0.5 mLs (2.5 mg total) by nebulization every 6 (six) hours as needed for wheezing or shortness of breath. 01/12/16   Noland Fordyce, PA-C  B Complex Vitamins (B COMPLEX PO) Take 1 tablet by mouth daily.    Historical Provider, MD  BIOTIN 5000 PO Take 1 capsule by mouth daily.     Historical Provider, MD  cetirizine (ZYRTEC) 10 MG tablet Take 10 mg by mouth daily as needed for allergies.    Historical Provider, MD  ciprofloxacin (CILOXAN) 0.3 % ophthalmic ointment One drop every 4 hours 09/15/15   Fransico Meadow, PA-C  doxycycline (VIBRAMYCIN) 100 MG capsule Take 1 capsule (100 mg total) by mouth 2 (two) times daily. One po bid x 7 days 01/12/16   Noland Fordyce, PA-C  HYDROcodone-acetaminophen (VICODIN) 5-500 MG tablet Take 1 tablet by mouth every 6 (six) hours as needed for pain.    Historical Provider, MD  levothyroxine (SYNTHROID, LEVOTHROID) 25 MCG tablet Take one tablet by mouth every other day with levothyroxine 200 mcg to equal 225 mcg total.    Historical Provider, MD  Multiple Vitamins-Minerals (VISION-VITE PRESERVE PO) Take  by mouth 2 (two) times daily.    Historical Provider, MD  predniSONE (DELTASONE) 20 MG tablet Take 2 tablets (40 mg total) by mouth daily with breakfast. x 4 days 01/12/16   Noland Fordyce, PA-C   Meds Ordered and Administered this Visit   Medications  ipratropium-albuterol (DUONEB) 0.5-2.5 (3) MG/3ML nebulizer solution 3 mL (3 mLs Nebulization Given 01/12/16 1113)  predniSONE (DELTASONE) tablet 60 mg (60 mg Oral Given 01/12/16 1044)    BP (!) 198/104 (BP Location: Left Wrist)   Pulse 71   Temp 98 F (36.7 C) (Oral)   Resp 20   SpO2 97%  No  data found.   Physical Exam  Constitutional: She is oriented to person, place, and time. She appears well-developed and well-nourished. No distress.  HENT:  Head: Normocephalic and atraumatic.  Eyes: Conjunctivae are normal. No scleral icterus.  Neck: Normal range of motion. Neck supple.  Cardiovascular: Normal rate, regular rhythm and normal heart sounds.   Pulmonary/Chest: Effort normal. No respiratory distress. She has wheezes ( faint diffuse expiratory wheeze). She has no rales. She exhibits no tenderness.  Abdominal: Soft. She exhibits no distension. There is no tenderness. There is no CVA tenderness.  Musculoskeletal: Normal range of motion. She exhibits edema and tenderness.  No midline spinal tenderness. Tenderness to Left lower lumbar muscles.  Normal gait. Bilateral lower legs: 1+ edema. Non-tender.  Neurological: She is alert and oriented to person, place, and time.  Skin: Skin is warm and dry. No rash noted. She is not diaphoretic. No erythema.  Nursing note and vitals reviewed.   Urgent Care Course   Clinical Course     Procedures (including critical care time)  Labs Review Labs Reviewed  POCT URINALYSIS DIP (DEVICE) - Abnormal; Notable for the following:       Result Value   Hgb urine dipstick TRACE (*)    Protein, ur >=300 (*)    Leukocytes, UA TRACE (*)    All other components within normal limits    Imaging  Review Dg Chest 2 View  Result Date: 01/12/2016 CLINICAL DATA:  Cough and shortness of breath.  Sick for 6 days. EXAM: CHEST  2 VIEW COMPARISON:  Two-view chest x-ray   01/04/2013 FINDINGS: The heart is upper limits of normal. Atherosclerotic calcifications are present in the aortic arch. Ill-defined right middle lobe airspace disease is noted. Mild pulmonary vascular congestion is noted. There is no effusion. The visualized soft tissues and bony thorax are unremarkable. IMPRESSION: 1. Borderline cardiomegaly and mild pulmonary vascular congestion. 2. Aortic atherosclerosis. 3. Ill-defined right middle lobe airspace disease concerning for bronchopneumonia. Electronically Signed   By: San Morelle M.D.   On: 01/12/2016 11:16    MDM   1. Pneumonia of right middle lobe due to infectious organism ([place])   2. Shortness of breath   3. Wheeze   4. Acute left-sided low back pain without sciatica    Pt c/o 1 week of gradually worsening intermittent dry cough with SOB and wheeze. She also is concerned for possible UTI due to localized Left lower back pain. Prednisone 60mg  PO given in UC with duoneb, pt notes SOB and wheeze have improved. Wheeze nearly gone on auscultation.   UA: not c/w UTI CXR: c/w bronchopneumonia. Will start pt on antibiotics  Rx: Doxycycline, prednisone (40mg  for 3 days, encouraged to monitor her sugar), and albuterol nebulizer solution.   F/u with PCP next week if not improving. Discussed symptoms that warrant emergent care in the ED. Patient verbalized understanding and agreement with treatment plan.     Noland Fordyce, PA-C 01/12/16 1141

## 2016-01-12 NOTE — ED Triage Notes (Addendum)
The patient presented to the [facility] with a complaint of coughing an wheezing x 1 week. The patient reported starting an albuterol inhaler that she already had about 1 week ago as well with minimal results.  The patient also reported lower back pain x 1 week that she stated gets worse at night. The patient expressed some concern about a possible UTI. Urine sample concern.

## 2016-02-01 ENCOUNTER — Other Ambulatory Visit: Payer: Self-pay | Admitting: Internal Medicine

## 2016-02-02 NOTE — Telephone Encounter (Signed)
Received refill request for ambien 10mg . Last refilled on 05-26-15 for 30 tabs with 5 refills. Pt's last OV with CY was 04-06-15 with a pending recall for 04-05-16.  CY please advise on refill. Thanks.

## 2016-02-02 NOTE — Telephone Encounter (Signed)
Ok to refill again, 6 months

## 2016-05-01 ENCOUNTER — Other Ambulatory Visit: Payer: Self-pay | Admitting: Internal Medicine

## 2016-05-01 DIAGNOSIS — Z1231 Encounter for screening mammogram for malignant neoplasm of breast: Secondary | ICD-10-CM

## 2016-06-12 ENCOUNTER — Ambulatory Visit (INDEPENDENT_AMBULATORY_CARE_PROVIDER_SITE_OTHER): Payer: Medicare Other | Admitting: Internal Medicine

## 2016-06-12 ENCOUNTER — Encounter: Payer: Self-pay | Admitting: Internal Medicine

## 2016-06-12 VITALS — BP 130/78 | HR 62 | Resp 16 | Ht 70.0 in | Wt 305.8 lb

## 2016-06-12 DIAGNOSIS — G4733 Obstructive sleep apnea (adult) (pediatric): Secondary | ICD-10-CM

## 2016-06-12 DIAGNOSIS — Z6835 Body mass index (BMI) 35.0-35.9, adult: Secondary | ICD-10-CM | POA: Diagnosis not present

## 2016-06-12 NOTE — Patient Instructions (Signed)
We can continue CPAP 10, mask of choice, humidifier, supplies, Airview     Dx OSA  Please call if we can help

## 2016-06-12 NOTE — Progress Notes (Signed)
Subjective:    Patient ID: Tiffany [name], female    DOB: Nov 02, 1950, 66 y.o.   MRN: [id number]  HPI  female never smoker followed for allergic rhinitis, OSA, complicated by obesity, HTN, hypothyroid, DM   -----------------------------------------------------------------------------------   04/06/2015-66 year old female never smoker followed for allergic rhinitis, OSA, complicated by obesity, HTN, hypothyroid, DM CPAP 10/[facility] FOLLOWS FOR: Wears CPAP nightly. Denies problems with mask/pressure. DME: [facility]  Download confirms her report of good compliance and comfortable control. She sleeps better with CPAP. This machine is about 65 year old. No active respiratory symptoms currently.  06/12/16- 66 year old female never smoker followed for allergic rhinitis, OSA, complicated by obesity, HTN, hypothyroid, DM CPAP 10/[facility] FOLLOW UP FOR cpap DME is [facility] patient states that she is doing very well on the cpap , no supplies needed at this time  Download confirms good compliance 93%/4 hours with good control AHI 0.3/hour. Machine is about 65 year old. She is very compliant and very comfortable, definitely feeling she benefits from using CPAP routinely. Using Zyrtec to control spring pollen rhinitis adequately.  ROS-see HPI Constitutional:   No-   weight loss, night sweats, fevers, chills, fatigue, lassitude. HEENT:   No-  headaches, difficulty swallowing, tooth/dental problems, sore throat,       No-  sneezing, itching, ear ache, nasal congestion, post nasal drip,  CV:  No-   chest pain, orthopnea, PND, swelling in lower extremities, anasarca,  dizziness, palpitations Resp: No-   shortness of breath with exertion or at rest.              No-   productive cough,  No non-productive cough,  No- coughing up of blood.              No-   change in color of mucus.  No- wheezing.   Skin: No-   rash or lesions. GI:  No-   heartburn, indigestion, abdominal pain, nausea, vomiting,  GU:  MS:  No-   joint  pain or swelling.   Neuro-     nothing unusual Psych:  No- change in mood or affect. No depression or anxiety.  No memory loss.  OBJ- Physical Exam weight today is 305 pounds   stable baseline exam General- Alert, Oriented, Affect-appropriate, Distress- none acute, +obese Skin- rash-none, lesions- none, excoriation- none Lymphadenopathy- none Head- atraumatic            Eyes- Gross vision intact, PERRLA, conjunctivae and secretions clear, +strabismus            Ears- Hearing, canals-normal            Nose- Clear, no-Septal dev, mucus, polyps, erosion, perforation             Throat- Mallampati III , mucosa clear , drainage- none, tonsils- atrophic Neck- flexible , trachea midline, no stridor , thyroid nl, carotid no bruit Chest - symmetrical excursion , unlabored           Heart/CV- RRR , 1/6 systolic murmur RUSB , no gallop  , no rub, nl s1 s2                           - JVD- none , edema- none, stasis changes- none, varices- none           Lung- clear to P&A, wheeze- none, cough- none , dullness-none, rub- none           Chest wall-  Abd-  Br/  Gen/ Rectal- Not done, not indicated Extrem- cyanosis- none, clubbing, none, atrophy- none, strength- nl Neuro- grossly intact to observation

## 2016-06-15 NOTE — Assessment & Plan Note (Signed)
She is convinced she is better off using CPAP, confirmed by download.

## 2016-06-15 NOTE — Assessment & Plan Note (Signed)
Offered bariatric referral

## 2016-06-24 ENCOUNTER — Encounter (HOSPITAL_COMMUNITY): Payer: Self-pay | Admitting: Emergency Medicine

## 2016-06-24 ENCOUNTER — Ambulatory Visit (HOSPITAL_COMMUNITY)
Admission: EM | Admit: 2016-06-24 | Discharge: 2016-06-24 | Disposition: A | Discharge status: Home / Self Care | Payer: Medicare Other | Attending: Family Medicine | Admitting: Family Medicine

## 2016-06-24 DIAGNOSIS — J029 Acute pharyngitis, unspecified: Secondary | ICD-10-CM | POA: Diagnosis not present

## 2016-06-24 MED ORDER — IPRATROPIUM BROMIDE 0.06 % NA SOLN: 2.0000 | Freq: Four times a day (QID) | NASAL | 0 refills | Status: DC

## 2016-06-24 MED ORDER — AZITHROMYCIN 250 MG PO TABS: 250.0000 mg | ORAL_TABLET | Freq: Every day | ORAL | 0 refills | Status: DC

## 2016-06-24 NOTE — ED Triage Notes (Signed)
The patient presented to the [facility] with a complaint of a sore throat x 4 days. The patient denied any fever.

## 2016-06-24 NOTE — ED Provider Notes (Signed)
CSN: [id number]     Arrival date & time 06/24/16  0109 History   First MD Initiated Contact with Patient 06/24/16 1025     Chief Complaint  Patient presents with  . Sore Throat   (Consider location/radiation/quality/duration/timing/severity/associated sxs/prior Treatment) Patient c/o uri sx's and sore throat for 4 days.   The history is provided by the patient.  Sore Throat  This is a new problem. The problem occurs constantly. The problem has not changed since onset.Nothing aggravates the symptoms.    Past Medical History:  Diagnosis Date  . ALLERGIC RHINITIS   . Diabetes mellitus   . Hypertension   . Hypothyroid   . Kidney disease   . Obesity   . Previous back surgery    x 2  . S/P arthroscopy   . Sleep apnea    Past Surgical History:  Procedure Laterality Date  . BREAST BIOPSY    . BREAST REDUCTION SURGERY  1982  . KNEE SURGERY     x 2  . LUMBAR DISC SURGERY     x 2  . TOE SURGERY    . TOTAL ABDOMINAL HYSTERECTOMY  1997   partial hysterectomy- still has ovaries  . TREATMENT FISTULA ANAL     Family History  Problem Relation Age of Onset  . Prostate cancer Father   . Congestive Heart Failure Mother   . Diabetes Sister   . Other Sister        septis   Social History  Substance Use Topics  . Smoking status: Never Smoker  . Smokeless tobacco: Never Used  . Alcohol use 0.0 oz/week     Comment: occasional   OB History    No data available     Review of Systems  Constitutional: Negative.   HENT: Positive for rhinorrhea and sore throat.   Eyes: Negative.   Respiratory: Negative.   Cardiovascular: Negative.   Gastrointestinal: Negative.   Endocrine: Negative.   Genitourinary: Negative.   Musculoskeletal: Negative.   Allergic/Immunologic: Negative.   Neurological: Negative.   Hematological: Negative.   Psychiatric/Behavioral: Negative.     Allergies  Patient has no known allergies.  Home Medications   Prior to Admission medications    Medication Sig Start Date End Date Taking? Authorizing Provider  albuterol (PROVENTIL) (5 MG/ML) 0.5% nebulizer solution Take 0.5 mLs (2.5 mg total) by nebulization every 6 (six) hours as needed for wheezing or shortness of breath. 01/12/16  Yes Noland Fordyce, PA-C  amLODipine (NORVASC) 10 MG tablet Take 10 mg by mouth daily.   Yes [provider]  aspirin 81 MG tablet Take 81 mg by mouth daily.     Yes [provider]  B Complex Vitamins (B COMPLEX PO) Take 1 tablet by mouth daily.   Yes [provider]  B-D INS SYRINGE 0.5CC/30GX1/2" 30G X 1/2" 0.5 ML MISC TAKE AS DIRECTED TWICE A DAY 08/12/15  Yes [provider]  BIOTIN 5000 PO Take 1 capsule by mouth daily.    Yes [provider]  cetirizine (ZYRTEC) 10 MG tablet Take 10 mg by mouth daily as needed for allergies.   Yes [provider]  cloNIDine (CATAPRES - DOSED IN MG/24 HR) 0.3 mg/24hr Place 1 patch onto the skin once a week. Patient needs to apply a new patch today    Yes [provider]  HYDROcodone-acetaminophen (VICODIN) 5-500 MG tablet Take 1 tablet by mouth every 6 (six) hours as needed for pain.   Yes [provider]  insulin glargine (LANTUS) 100 UNIT/ML injection Inject 30 Units into the skin at bedtime. As directed   Yes [provider]  levothyroxine (SYNTHROID, LEVOTHROID) 200 MCG tablet Take 200 mcg by mouth every other day. Take one tablet by mouth every other day with levothyroxine 25 mcg to equal 225 mcg total.   Yes [provider]  losartan (COZAAR) 100 MG tablet Take 100 mg by mouth daily. 08/18/15  Yes [provider]  Multiple Vitamins-Minerals (MULTIVITAMIN WITH MINERALS) tablet Take 1 tablet by mouth daily.   Yes [provider]  Multiple Vitamins-Minerals (VISION-VITE PRESERVE PO) Take by mouth 2 (two) times daily.   Yes [provider]  PROAIR HFA 108 (90 Base) MCG/ACT inhaler INHALE 1-2 PUFFS INTO THE  LUNGS EVERY 8 HOURS AS NEEDED FOR WHEEZING OR SHORTNESS OF BREATH 07/05/15  Yes [provider]  rosuvastatin (CRESTOR) 10 MG tablet Take 10 mg by mouth daily.     Yes [provider]  torsemide (DEMADEX) 100 MG tablet Take 50 mg by mouth daily.   Yes [provider]  zolpidem (AMBIEN) 10 MG tablet TAKE 1 TABLET AT BEDTIME AS NEEDED FOR SLEEP 02/02/16  Yes Hausen, Clinton D, MD  azithromycin (ZITHROMAX) 250 MG tablet Take 1 tablet (250 mg total) by mouth daily. Take first 2 tablets together, then 1 every day until finished. 06/24/16   Lysbeth Penner, FNP  ipratropium (ATROVENT) 0.06 % nasal spray Place 2 sprays into both nostrils 4 (four) times daily. 06/24/16   Lysbeth Penner, FNP   Meds Ordered and Administered this Visit  Medications - No data to display  BP (!) 176/68 (BP Location: Left Arm) Comment: notified cma  Pulse 79   Temp 98.7 F (37.1 C) (Oral)   Resp 16   SpO2 100%  No data found.   Physical Exam  Constitutional: She appears well-developed and well-nourished.  HENT:  Head: Normocephalic and atraumatic.  Right Ear: External ear normal.  Left Ear: External ear normal.  opx erythematous and tonsils 2 plus  Eyes: Conjunctivae and EOM are normal. Pupils are equal, round, and reactive to light.  Neck: Normal range of motion. Neck supple.  Cardiovascular: Normal rate, regular rhythm and normal heart sounds.   Pulmonary/Chest: Effort normal and breath sounds normal.  Abdominal: Soft. Bowel sounds are normal.  Lymphadenopathy:    She has cervical adenopathy.  Nursing note and vitals reviewed.   Urgent Care Course     Procedures (including critical care time)  Labs Review Labs Reviewed - No data to display  Imaging Review No results found.   Visual Acuity Review  Right Eye Distance:   Left Eye Distance:   Bilateral Distance:    Right Eye Near:   Left Eye Near:    Bilateral Near:         MDM   1. Pharyngitis, unspecified  etiology   2. Sore throat    zpak as directed Ipratropium nasal spray  Push po fluids, rest, tylenol and motrin otc prn as directed for fever, arthralgias, and myalgias.  Follow up prn if sx's continue or persist.    Lysbeth Penner, FNP 06/24/16 1031

## 2016-08-12 ENCOUNTER — Ambulatory Visit
Admission: RE | Admit: 2016-08-12 | Discharge: 2016-08-12 | Disposition: A | Discharge status: Home / Self Care | Payer: Medicare Other | Source: Ambulatory Visit | Attending: Internal Medicine | Admitting: Internal Medicine

## 2016-08-12 DIAGNOSIS — E2839 Other primary ovarian failure: Secondary | ICD-10-CM

## 2016-08-12 DIAGNOSIS — Z1231 Encounter for screening mammogram for malignant neoplasm of breast: Secondary | ICD-10-CM

## 2016-08-25 NOTE — Progress Notes (Signed)
Cardiology Office Note    Date:  08/26/2016   ID:  Tiffany [name], DOB 12-Jan-1951, MRN [id number]  PCP:  [facility]  Cardiologist: Sinclair Grooms, MD   Chief Complaint  Patient presents with  . Congestive Heart Failure    History of Present Illness:  Tiffany [name] is a 66 y.o. female who presents for f/u of  diastolic heart failure, hypertension, obesity, and also a background history of CK D stage III. She denies orthopnea PND. No episodes of syncope or prolonged palpitation.   The patient complains of exertional palpitations and tachycardia associated with marked increase in fatigue and dyspnea. This is progressive over the past 2 months. It is preventing activity that she can ordinarily do. She denies chest discomfort. She is not having lower extremity swelling or orthopnea. No heart racing a pounding at rest.  Past Medical History:  Diagnosis Date  . ALLERGIC RHINITIS   . Diabetes mellitus   . Hypertension   . Hypothyroid   . Kidney disease   . Obesity   . Previous back surgery    x 2  . S/P arthroscopy   . Sleep apnea     Past Surgical History:  Procedure Laterality Date  . BREAST BIOPSY  20+ yrs ago   dont know which breast per pt; benign  . BREAST REDUCTION SURGERY  1982  . KNEE SURGERY     x 2  . LUMBAR DISC SURGERY     x 2  . REDUCTION MAMMAPLASTY Bilateral 1985  . TOE SURGERY    . TOTAL ABDOMINAL HYSTERECTOMY  1997   partial hysterectomy- still has ovaries  . TREATMENT FISTULA ANAL      Current Medications: Outpatient Medications Prior to Visit  Medication Sig Dispense Refill  . amLODipine (NORVASC) 10 MG tablet Take 10 mg by mouth daily.    Marland Kitchen aspirin 81 MG tablet Take 81 mg by mouth daily.      . B Complex Vitamins (B COMPLEX PO) Take 1 tablet by mouth daily.    . B-D INS SYRINGE 0.5CC/30GX1/2" 30G X 1/2" 0.5 ML MISC TAKE AS DIRECTED TWICE A DAY  3  . BIOTIN 5000 PO Take 1 capsule by mouth daily.     . cetirizine (ZYRTEC) 10 MG tablet  Take 10 mg by mouth daily as needed for allergies.    . cloNIDine (CATAPRES - DOSED IN MG/24 HR) 0.3 mg/24hr Place 1 patch onto the skin once a week. Patient needs to apply a new patch today     . HYDROcodone-acetaminophen (VICODIN) 5-500 MG tablet Take 1 tablet by mouth every 6 (six) hours as needed for pain.    Marland Kitchen insulin glargine (LANTUS) 100 UNIT/ML injection Inject 30 Units into the skin at bedtime. As directed    . ipratropium (ATROVENT) 0.06 % nasal spray Place 2 sprays into both nostrils 4 (four) times daily. 15 mL 0  . losartan (COZAAR) 100 MG tablet Take 100 mg by mouth daily.  6  . Multiple Vitamins-Minerals (VISION-VITE PRESERVE PO) Take 1 tablet by mouth daily.     Marland Kitchen PROAIR HFA 108 (90 Base) MCG/ACT inhaler INHALE 1-2 PUFFS INTO THE LUNGS EVERY 8 HOURS AS NEEDED FOR WHEEZING OR SHORTNESS OF BREATH  5  . rosuvastatin (CRESTOR) 10 MG tablet Take 10 mg by mouth daily.      Marland Kitchen albuterol (PROVENTIL) (5 MG/ML) 0.5% nebulizer solution Take 0.5 mLs (2.5 mg total) by nebulization every 6 (six) hours as  needed for wheezing or shortness of breath. (Patient not taking: Reported on 08/26/2016) 20 mL 12  . azithromycin (ZITHROMAX) 250 MG tablet Take 1 tablet (250 mg total) by mouth daily. Take first 2 tablets together, then 1 every day until finished. 6 tablet 0  . levothyroxine (SYNTHROID, LEVOTHROID) 200 MCG tablet Take 200 mcg by mouth every other day. Take one tablet by mouth every other day with levothyroxine 25 mcg to equal 225 mcg total.    . Multiple Vitamins-Minerals (MULTIVITAMIN WITH MINERALS) tablet Take 1 tablet by mouth daily.    Marland Kitchen torsemide (DEMADEX) 100 MG tablet Take 50 mg by mouth daily.    Marland Kitchen zolpidem (AMBIEN) 10 MG tablet TAKE 1 TABLET AT BEDTIME AS NEEDED FOR SLEEP (Patient not taking: Reported on 08/26/2016) 30 tablet 5   No facility-administered medications prior to visit.      Allergies:   Patient has no known allergies.   Social History   Social History  . Marital status:  Single    Spouse name: N/A  . Number of children: N/A  . Years of education: N/A   Occupational History  . school bus driver    Social History Main Topics  . Smoking status: Never Smoker  . Smokeless tobacco: Never Used  . Alcohol use 0.0 oz/week     Comment: occasional  . Drug use: No  . Sexual activity: No   Other Topics Concern  . None   Social History Narrative  . None     Family History:  The patient's family history includes Congestive Heart Failure in her mother; Diabetes in her sister; Other in her sister; Prostate cancer in her father.   ROS:   Please see the history of present illness.    Occasional wheezing. Sleep apnea and admits to wearing C Pap apparatus. Denies edema. Obstructive sleep apnea apparatus recently evaluated by Dr. Baird Lyons.  All other systems reviewed and are negative.   PHYSICAL EXAM:   VS:  BP (!) 180/84 (BP Location: Left Arm)   Pulse 75   Ht 5' 9.5" (1.765 m)   Wt (!) 303 lb (137.4 kg)   BMI 44.10 kg/m    GEN: Well nourished, well developed, in no acute distress . Marked obesity. HEENT: normal  Neck: no JVD, carotid bruits, or masses Cardiac: RRR; no murmurs, rubs, or gallops,no edema  Respiratory:  clear to auscultation bilaterally, normal work of breathing GI: soft, nontender, nondistended, + BS MS: no deformity or atrophy  Skin: warm and dry, no rash Neuro:  Alert and Oriented x 3, Strength and sensation are intact Psych: euthymic mood, full affect  Wt Readings from Last 3 Encounters:  08/26/16 (!) 303 lb (137.4 kg)  06/12/16 (!) 305 lb 12.8 oz (138.7 kg)  08/25/15 299 lb 3.2 oz (135.7 kg)      Studies/Labs Reviewed:   EKG:  EKG  Normal sinus rhythm, possible left atrial abnormality. Otherwise no significant abnormalities.. There is no change when compared to the prior tracing from July 2017.  Recent Labs: 08/26/2016: BUN WILL FOLLOW; Creatinine, Ser WILL FOLLOW; Hemoglobin 9.9; Platelets 172; Potassium WILL FOLLOW;  Sodium WILL FOLLOW   Lipid Panel No results found for: CHOL, TRIG, HDL, CHOLHDL, VLDL, LDLCALC, LDLDIRECT  Additional studies/ records that were reviewed today include:  Echocardiogram formed 04/07/2014: Study Conclusions  - Left ventricle: The cavity size was normal. Wall thickness was increased in a pattern of moderate LVH. Systolic function was normal. The estimated ejection fraction was in  the range of 60% to 65%. Wall motion was normal; there were no regional wall motion abnormalities. Features are consistent with a pseudonormal left ventricular filling pattern, with concomitant abnormal relaxation and increased filling pressure (grade 2 diastolic dysfunction). - Mitral valve: There was mild regurgitation.   ASSESSMENT:    1. SOB (shortness of breath)   2. Palpitation   3. Chronic diastolic heart failure ([place])   4. Essential hypertension   5. Obstructive sleep apnea   6. CKD (chronic kidney disease), stage III   7. Tachycardia      PLAN:  In order of problems listed above:  1. BNP, BMET, CBC, and d-dimer are being done to evaluate the complaint of dyspnea on exertion. Further evaluation may be necessary depending upon findings. 2. 24 hour Holter to evaluate complaint of irregularity/tachycardia/palpitations with mild to moderate physical activity. 3. No clinical evidence of volume overload. 4. Repeat blood pressure was 138/70 mmHg with a large cuff after the patient was roomed and had rested. Also of note is that she has not had her daily Demadex because she was going to be out most of the day and wanted to avoid difficulty with frequent urination. 5. Not addressed 6. Blood work is obtained today to reassess kidney function.  Overall she seems to be doing relatively well. Lab work and a Holter monitor will be done to assess above complaints of dyspnea and palpitations with activity. May need further evaluation with an echocardiogram depending upon initial  findings.  Greater than 50% of the time was spent in counseling and explaining the desired workup for her complaints.   Medication Adjustments/Labs and Tests Ordered: Current medicines are reviewed at length with the patient today.  Concerns regarding medicines are outlined above.  Medication changes, Labs and Tests ordered today are listed in the Patient Instructions below. Patient Instructions  Medication Instructions:  None  Labwork: Pro BNP, BMET, CBC and DDimer today  Testing/Procedures: Your physician has recommended that you wear a 24 hour holter monitor. Holter monitors are medical devices that record the heart's electrical activity. Doctors most often use these monitors to diagnose arrhythmias. Arrhythmias are problems with the speed or rhythm of the heartbeat. The monitor is a small, portable device. You can wear one while you do your normal daily activities. This is usually used to diagnose what is causing palpitations/syncope (passing out).    Follow-Up: Your physician wants you to follow-up in: 1 year with Dr. Tamala Julian. You will receive a reminder letter in the mail two months in advance. If you don't receive a letter, please call our office to schedule the follow-up appointment.    Any Other Special Instructions Will Be Listed Below (If Applicable).     If you need a refill on your cardiac medications before your next appointment, please call your pharmacy.      Signed, Sinclair Grooms, MD  08/26/2016 6:08 PM    [facility] [place], [place], [place]  [address] Phone: [phone]; Fax: [phone]

## 2016-08-26 ENCOUNTER — Encounter: Payer: Self-pay | Admitting: Interventional Cardiology

## 2016-08-26 ENCOUNTER — Ambulatory Visit (INDEPENDENT_AMBULATORY_CARE_PROVIDER_SITE_OTHER): Payer: Medicare Other | Admitting: Interventional Cardiology

## 2016-08-26 VITALS — BP 180/84 | HR 75 | Ht 69.5 in | Wt 303.0 lb

## 2016-08-26 DIAGNOSIS — R0602 Shortness of breath: Secondary | ICD-10-CM | POA: Insufficient documentation

## 2016-08-26 DIAGNOSIS — R002 Palpitations: Secondary | ICD-10-CM

## 2016-08-26 DIAGNOSIS — N183 Chronic kidney disease, stage 3 unspecified: Secondary | ICD-10-CM

## 2016-08-26 DIAGNOSIS — I10 Essential (primary) hypertension: Secondary | ICD-10-CM

## 2016-08-26 DIAGNOSIS — I5032 Chronic diastolic (congestive) heart failure: Secondary | ICD-10-CM

## 2016-08-26 DIAGNOSIS — R000 Tachycardia, unspecified: Secondary | ICD-10-CM | POA: Diagnosis not present

## 2016-08-26 DIAGNOSIS — G4733 Obstructive sleep apnea (adult) (pediatric): Secondary | ICD-10-CM | POA: Diagnosis not present

## 2016-08-26 NOTE — Patient Instructions (Signed)
Medication Instructions:  None  Labwork: Pro BNP, BMET, CBC and DDimer today  Testing/Procedures: Your physician has recommended that you wear a 24 hour holter monitor. Holter monitors are medical devices that record the heart's electrical activity. Doctors most often use these monitors to diagnose arrhythmias. Arrhythmias are problems with the speed or rhythm of the heartbeat. The monitor is a small, portable device. You can wear one while you do your normal daily activities. This is usually used to diagnose what is causing palpitations/syncope (passing out).    Follow-Up: Your physician wants you to follow-up in: 1 year with Dr. Tamala Julian. You will receive a reminder letter in the mail two months in advance. If you don't receive a letter, please call our office to schedule the follow-up appointment.    Any Other Special Instructions Will Be Listed Below (If Applicable).     If you need a refill on your cardiac medications before your next appointment, please call your pharmacy.

## 2016-08-27 ENCOUNTER — Telehealth: Payer: Self-pay | Admitting: *Deleted

## 2016-08-27 DIAGNOSIS — R7989 Other specified abnormal findings of blood chemistry: Secondary | ICD-10-CM

## 2016-08-27 DIAGNOSIS — R799 Abnormal finding of blood chemistry, unspecified: Secondary | ICD-10-CM

## 2016-08-27 LAB — BASIC METABOLIC PANEL
BUN/Creatinine Ratio: 39 — ABNORMAL HIGH (ref 12–28)
BUN: 107 mg/dL (ref 8–27)
CO2: 25 mmol/L (ref 20–29)
Calcium: 9.5 mg/dL (ref 8.7–10.3)
Chloride: 105 mmol/L (ref 96–106)
Creatinine, Ser: 2.71 mg/dL — ABNORMAL HIGH (ref 0.57–1.00)
GFR calc Af Amer: 20 mL/min/{1.73_m2} — ABNORMAL LOW (ref >59)
GFR calc non Af Amer: 18 mL/min/{1.73_m2} — ABNORMAL LOW (ref >59)
Glucose: 166 mg/dL — ABNORMAL HIGH (ref 65–99)
Potassium: 4.4 mmol/L (ref 3.5–5.2)
Sodium: 142 mmol/L (ref 134–144)

## 2016-08-27 LAB — CBC
Hematocrit: 29.5 % — ABNORMAL LOW (ref 34.0–46.6)
Hemoglobin: 9.9 g/dL — ABNORMAL LOW (ref 11.1–15.9)
MCH: 26.3 pg — ABNORMAL LOW (ref 26.6–33.0)
MCHC: 33.6 g/dL (ref 31.5–35.7)
MCV: 79 fL (ref 79–97)
Platelets: 172 10*3/uL (ref 150–379)
RBC: 3.76 x10E6/uL — ABNORMAL LOW (ref 3.77–5.28)
RDW: 16.3 % — ABNORMAL HIGH (ref 12.3–15.4)
WBC: 7.3 10*3/uL (ref 3.4–10.8)

## 2016-08-27 LAB — D-DIMER, QUANTITATIVE: D-DIMER: 1.15 mg/L FEU — ABNORMAL HIGH (ref 0.00–0.49)

## 2016-08-27 LAB — PRO B NATRIURETIC PEPTIDE: NT-Pro BNP: 238 pg/mL (ref 0–301)

## 2016-08-27 NOTE — Telephone Encounter (Signed)
-----   Message from Belva Crome, MD sent at 08/26/2016  7:32 PM EDT ----- Let the patient know let her know the hemoglobin is 9.9 which is 3 g lower than 4 years ago. 5 years ago was 9.4. Needs to see her primary care to determine why blood counts are low. A copy will be sent to [place]

## 2016-08-27 NOTE — Telephone Encounter (Signed)
Notes recorded by Belva Crome, MD on 08/27/2016 at 8:14 AM EDT Let the patient know the blood work is markedly abnormal and has evidence of kidney function decline and severe anemia. Please hold Losartan, stop Demadex and repeat BMET in 1 week. Needs to f/u 2 weeks and see her kidney specialist now if she has one. A copy will be sent to [place], [place] with pt and went over lab results and recommendations per Dr. Tamala Julian. Pt has appt with her Nephrologist, Dr. Florene Glen at [facility] on Friday.  Faxed labs to their office.  Pt will come for repeat labs on 09/04/16.  Advised pt to call on Friday after she sees Nephrologist if he makes any changes.  Pt verbalized understanding and was in agreement with this plan.  Spoke with Dr. Tamala Julian and since pt is seeing Nephrologist so soon he would like to see pt back in a month.  Left message for pt to call back.

## 2016-08-28 NOTE — Telephone Encounter (Signed)
Spoke with pt and scheduled pt to see Cecilie Kicks, NP on 09/13/16.

## 2016-09-02 ENCOUNTER — Telehealth: Payer: Self-pay | Admitting: Interventional Cardiology

## 2016-09-02 NOTE — Telephone Encounter (Signed)
New message      Pt c/o medication issue:  1. Name of Medication: torsemide  2. How are you currently taking this medication (dosage and times per day)? Pt took 100mg  last Friday, sat and Sunday.  Starting today, pt will take 50mg   3. Are you having a reaction (difficulty breathing--STAT)? no 4. What is your medication issue? Calling to let Dr Tamala Julian know that Dr Erling Cruz started pt on torsemide again

## 2016-09-04 ENCOUNTER — Other Ambulatory Visit: Payer: Medicare Other | Admitting: *Deleted

## 2016-09-04 DIAGNOSIS — R7989 Other specified abnormal findings of blood chemistry: Secondary | ICD-10-CM

## 2016-09-04 DIAGNOSIS — R799 Abnormal finding of blood chemistry, unspecified: Secondary | ICD-10-CM

## 2016-09-04 LAB — BASIC METABOLIC PANEL
BUN/Creatinine Ratio: 35 — ABNORMAL HIGH (ref 12–28)
BUN: 83 mg/dL (ref 8–27)
CO2: 21 mmol/L (ref 20–29)
Calcium: 9.3 mg/dL (ref 8.7–10.3)
Chloride: 99 mmol/L (ref 96–106)
Creatinine, Ser: 2.35 mg/dL — ABNORMAL HIGH (ref 0.57–1.00)
GFR calc Af Amer: 24 mL/min/{1.73_m2} — ABNORMAL LOW (ref >59)
GFR calc non Af Amer: 21 mL/min/{1.73_m2} — ABNORMAL LOW (ref >59)
Glucose: 83 mg/dL (ref 65–99)
Potassium: 5 mmol/L (ref 3.5–5.2)
Sodium: 140 mmol/L (ref 134–144)

## 2016-09-04 NOTE — Telephone Encounter (Signed)
pk

## 2016-09-06 ENCOUNTER — Other Ambulatory Visit (HOSPITAL_COMMUNITY): Payer: Self-pay | Admitting: *Deleted

## 2016-09-09 ENCOUNTER — Ambulatory Visit (HOSPITAL_COMMUNITY)
Admission: RE | Admit: 2016-09-09 | Discharge: 2016-09-09 | Disposition: A | Discharge status: Home / Self Care | Payer: Medicare Other | Source: Ambulatory Visit | Attending: Nephrology | Admitting: Nephrology

## 2016-09-09 ENCOUNTER — Ambulatory Visit (INDEPENDENT_AMBULATORY_CARE_PROVIDER_SITE_OTHER): Payer: Medicare Other

## 2016-09-09 DIAGNOSIS — D631 Anemia in chronic kidney disease: Secondary | ICD-10-CM | POA: Diagnosis present

## 2016-09-09 DIAGNOSIS — N183 Chronic kidney disease, stage 3 unspecified: Secondary | ICD-10-CM

## 2016-09-09 DIAGNOSIS — R000 Tachycardia, unspecified: Secondary | ICD-10-CM

## 2016-09-09 DIAGNOSIS — R002 Palpitations: Secondary | ICD-10-CM

## 2016-09-09 LAB — POCT HEMOGLOBIN-HEMACUE: Hemoglobin: 9.6 g/dL — ABNORMAL LOW (ref 12.0–15.0)

## 2016-09-09 MED ORDER — EPOETIN ALFA 20000 UNIT/ML IJ SOLN
20000.0000 [IU] | INTRAMUSCULAR | Status: DC
  Administered 2016-09-09: 20000 [IU] via SUBCUTANEOUS

## 2016-09-09 MED ORDER — FERUMOXYTOL INJECTION 510 MG/17 ML
510.0000 mg | INTRAVENOUS | Status: DC
  Administered 2016-09-09: 510 mg via INTRAVENOUS
  Filled 2016-09-09: qty 17

## 2016-09-09 MED ORDER — EPOETIN ALFA 20000 UNIT/ML IJ SOLN
INTRAMUSCULAR | Status: AC
  Administered 2016-09-09: 20000 [IU]
  Filled 2016-09-09: qty 1

## 2016-09-09 NOTE — Discharge Instructions (Signed)
Epoetin Alfa injection °What is this medicine? °EPOETIN ALFA (e POE e tin AL fa) helps your body make more red blood cells. This medicine is used to treat anemia caused by chronic kidney failure, cancer chemotherapy, or HIV-therapy. It may also be used before surgery if you have anemia. °This medicine may be used for other purposes; ask your health care provider or pharmacist if you have questions. °COMMON BRAND NAME(S): Epogen, Procrit °What should I tell my health care provider before I take this medicine? °They need to know if you have any of these conditions: °-blood clotting disorders °-cancer patient not on chemotherapy °-cystic fibrosis °-heart disease, such as angina or heart failure °-hemoglobin level of 12 g/dL or greater °-high blood pressure °-low levels of folate, iron, or vitamin B12 °-seizures °-an unusual or allergic reaction to erythropoietin, albumin, benzyl alcohol, hamster proteins, other medicines, foods, dyes, or preservatives °-pregnant or trying to get pregnant °-breast-feeding °How should I use this medicine? °This medicine is for injection into a vein or under the skin. It is usually given by a health care professional in a hospital or clinic setting. °If you get this medicine at home, you will be taught how to prepare and give this medicine. Use exactly as directed. Take your medicine at regular intervals. Do not take your medicine more often than directed. °It is important that you put your used needles and syringes in a special sharps container. Do not put them in a trash can. If you do not have a sharps container, call your pharmacist or healthcare provider to get one. °A special MedGuide will be given to you by the pharmacist with each prescription and refill. Be sure to read this information carefully each time. °Talk to your pediatrician regarding the use of this medicine in children. While this drug may be prescribed for selected conditions, precautions do apply. °Overdosage: If you  think you have taken too much of this medicine contact a poison control center or emergency room at once. °NOTE: This medicine is only for you. Do not share this medicine with others. °What if I miss a dose? °If you miss a dose, take it as soon as you can. If it is almost time for your next dose, take only that dose. Do not take double or extra doses. °What may interact with this medicine? °Do not take this medicine with any of the following medications: °-darbepoetin alfa °This list may not describe all possible interactions. Give your health care provider a list of all the medicines, herbs, non-prescription drugs, or dietary supplements you use. Also tell them if you smoke, drink alcohol, or use illegal drugs. Some items may interact with your medicine. °What should I watch for while using this medicine? °Your condition will be monitored carefully while you are receiving this medicine. °You may need blood work done while you are taking this medicine. °What side effects may I notice from receiving this medicine? °Side effects that you should report to your doctor or health care professional as soon as possible: °-allergic reactions like skin rash, itching or hives, swelling of the face, lips, or tongue °-breathing problems °-changes in vision °-chest pain °-confusion, trouble speaking or understanding °-feeling faint or lightheaded, falls °-high blood pressure °-muscle aches or pains °-pain, swelling, warmth in the leg °-rapid weight gain °-severe headaches °-sudden numbness or weakness of the face, arm or leg °-trouble walking, dizziness, loss of balance or coordination °-seizures (convulsions) °-swelling of the ankles, feet, hands °-unusually weak or tired °  Side effects that usually do not require medical attention (report to your doctor or health care professional if they continue or are bothersome): °-diarrhea °-fever, chills (flu-like symptoms) °-headaches °-nausea, vomiting °-redness, stinging, or swelling at  site where injected °This list may not describe all possible side effects. Call your doctor for medical advice about side effects. You may report side effects to FDA at 1-800-FDA-1088. °Where should I keep my medicine? °Keep out of the reach of children. °Store in a refrigerator between 2 and 8 degrees C (36 and 46 degrees F). Do not freeze or shake. Throw away any unused portion if using a single-dose vial. Multi-dose vials can be kept in the refrigerator for up to 21 days after the initial dose. Throw away unused medicine. °NOTE: This sheet is a summary. It may not cover all possible information. If you have questions about this medicine, talk to your doctor, pharmacist, or health care provider. °© 2018 Elsevier/Gold Standard (2015-09-11 19:42:31) ° °Ferumoxytol injection °What is this medicine? °FERUMOXYTOL is an iron complex. Iron is used to make healthy red blood cells, which carry oxygen and nutrients throughout the body. This medicine is used to treat iron deficiency anemia in people with chronic kidney disease. °This medicine may be used for other purposes; ask your health care provider or pharmacist if you have questions. °COMMON BRAND NAME(S): Feraheme °What should I tell my health care provider before I take this medicine? °They need to know if you have any of these conditions: °-anemia not caused by low iron levels °-high levels of iron in the blood °-magnetic resonance imaging (MRI) test scheduled °-an unusual or allergic reaction to iron, other medicines, foods, dyes, or preservatives °-pregnant or trying to get pregnant °-breast-feeding °How should I use this medicine? °This medicine is for injection into a vein. It is given by a health care professional in a hospital or clinic setting. °Talk to your pediatrician regarding the use of this medicine in children. Special care may be needed. °Overdosage: If you think you have taken too much of this medicine contact a poison control center or emergency  room at once. °NOTE: This medicine is only for you. Do not share this medicine with others. °What if I miss a dose? °It is important not to miss your dose. Call your doctor or health care professional if you are unable to keep an appointment. °What may interact with this medicine? °This medicine may interact with the following medications: °-other iron products °This list may not describe all possible interactions. Give your health care provider a list of all the medicines, herbs, non-prescription drugs, or dietary supplements you use. Also tell them if you smoke, drink alcohol, or use illegal drugs. Some items may interact with your medicine. °What should I watch for while using this medicine? °Visit your doctor or healthcare professional regularly. Tell your doctor or healthcare professional if your symptoms do not start to get better or if they get worse. You may need blood work done while you are taking this medicine. °You may need to follow a special diet. Talk to your doctor. Foods that contain iron include: whole grains/cereals, dried fruits, beans, or peas, leafy green vegetables, and organ meats (liver, kidney). °What side effects may I notice from receiving this medicine? °Side effects that you should report to your doctor or health care professional as soon as possible: °-allergic reactions like skin rash, itching or hives, swelling of the face, lips, or tongue °-breathing problems °-changes in blood pressure °-feeling   faint or lightheaded, falls °-fever or chills °-flushing, sweating, or hot feelings °-swelling of the ankles or feet °Side effects that usually do not require medical attention (report to your doctor or health care professional if they continue or are bothersome): °-diarrhea °-headache °-nausea, vomiting °-stomach pain °This list may not describe all possible side effects. Call your doctor for medical advice about side effects. You may report side effects to FDA at 1-800-FDA-1088. °Where  should I keep my medicine? °This drug is given in a hospital or clinic and will not be stored at home. °NOTE: This sheet is a summary. It may not cover all possible information. If you have questions about this medicine, talk to your doctor, pharmacist, or health care provider. °© 2018 Elsevier/Gold Standard (2015-02-23 12:41:49) ° ° ° °

## 2016-09-11 ENCOUNTER — Telehealth: Payer: Self-pay | Admitting: Interventional Cardiology

## 2016-09-11 NOTE — Telephone Encounter (Signed)
She should not resume losartan until cleared by nephrology. Blood pressure management should be by nephrology until kidney function is stable. If there are too many people making medication changes, there is great risk of error.

## 2016-09-11 NOTE — Telephone Encounter (Signed)
Pt seen by PCP two days ago and BP was elevated and she was up 8lbs since last seen by [place].  She said they told her to go back on the Losartan at that time.  Losartan was placed on hold by our office d/t elevated kidney fx.  Pt wanted to know if Dr. Tamala Julian felt like she was ok to restart this medication?  Advised I would send a message to Dr. Tamala Julian for review but she also needed to contact Nephro since kidney fx was the reason for holding it.  Pt is scheduled to see Cecilie Kicks, NP on Friday of this week.

## 2016-09-11 NOTE — Telephone Encounter (Signed)
New message    Pt is calling.  Pt c/o medication issue:  1. Name of Medication: Losartan,   2. How are you currently taking this medication (dosage and times per day)? Once daily  3. Are you having a reaction (difficulty breathing--STAT)?   4. What is your medication issue? Pt states Dr. Tamala Julian took her off of this medication, and yesterday at the [facility], the doctor there put her back on it. She wants to know if she needs to take it or what to do?

## 2016-09-12 DIAGNOSIS — E1169 Type 2 diabetes mellitus with other specified complication: Secondary | ICD-10-CM | POA: Insufficient documentation

## 2016-09-12 DIAGNOSIS — E119 Type 2 diabetes mellitus without complications: Secondary | ICD-10-CM | POA: Insufficient documentation

## 2016-09-12 DIAGNOSIS — E785 Hyperlipidemia, unspecified: Secondary | ICD-10-CM | POA: Insufficient documentation

## 2016-09-12 NOTE — Telephone Encounter (Signed)
Spoke with pt and made her aware of recommendations per Dr. Tamala Julian.  Pt will contact Nephrologist in regards to BP meds.  Pt appreciative for call.

## 2016-09-12 NOTE — Progress Notes (Signed)
Cardiology Office Note:    Date:  09/13/2016   ID:  Tiffany [name], DOB 1950/05/11, MRN [id number]  PCP:  [facility]  Cardiologist:  Dr Daneen Schick  Referring MD: [facility]   Chief Complaint  Patient presents with  . Follow-up    SOB/Seen for Dr. Tamala Julian    History of Present Illness:    Tiffany [name] is a 66 y.o. female with a past medical history significant for Diastolic heart failure, hypertension, diabetes CKD stage III.  The patient was last seen in the office by Dr. Tamala Julian on 08/26/16 for complaints of exertional palpitations and tachycardia associated with marked increase in fatigue and dyspnea. This was progressive over the previous 2 months and was limiting her daily activities. Several tests were done including Pro BNP which was normal at 238, d-dimer which was elevated at 1.15. Her renal function had declined and she was instructed to stop losartan and Demadex and follow-up with nephrology. She was also anemic with hemoglobin of 9.9. 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 She was advised to see her primary care physician. A Holter monitor showed normal sinus rhythm with only rare PACs and PVCs.  Nephrology notes were reviewed and indicate that the patient likely has diabetic nephropathy. Advised to continue to hold losartan and restart torsemide 100 mg, half tablet daily for patient's complaints of edema. She is to follow-up with nephrology in 4 months.  Today the patient is here alone. She denies any chest pain. She is short of breath with walking in from the parking lot and this is unchanged over the past several months. She has 4-6 pillow orthopnea which is her baseline. She uses CPAP every night. She had lightheadedness last week but this is much improved this week. She reports that she received a Procrit injection and iron infusion on Monday 8/6 for treatment of her anemia.  Past Medical History:  Diagnosis Date  . ALLERGIC RHINITIS   .  Diabetes mellitus   . Hypertension   . Hypothyroid   . Kidney disease   . Obesity   . Previous back surgery    x 2  . S/P arthroscopy   . Sleep apnea     Past Surgical History:  Procedure Laterality Date  . BREAST BIOPSY  20+ yrs ago   dont know which breast per pt; benign  . BREAST REDUCTION SURGERY  1982  . KNEE SURGERY     x 2  . LUMBAR DISC SURGERY     x 2  . REDUCTION MAMMAPLASTY Bilateral 1985  . TOE SURGERY    . TOTAL ABDOMINAL HYSTERECTOMY  1997   partial hysterectomy- still has ovaries  . TREATMENT FISTULA ANAL      Current Medications: Current Meds  Medication Sig  . allopurinol (ZYLOPRIM) 100 MG tablet Take 100 mg by mouth 2 (two) times daily.  Marland Kitchen amLODipine (NORVASC) 10 MG tablet Take 10 mg by mouth daily.  Marland Kitchen aspirin 81 MG tablet Take 81 mg by mouth daily.    . B Complex Vitamins (B COMPLEX PO) Take 1 tablet by mouth daily.  . B-D INS SYRINGE 0.5CC/30GX1/2" 30G X 1/2" 0.5 ML MISC TAKE AS DIRECTED TWICE A DAY  . BIOTIN 5000 PO Take 1 capsule by mouth daily.   . cetirizine (ZYRTEC) 10 MG tablet Take 10 mg by mouth daily as needed for allergies.  . cloNIDine (CATAPRES - DOSED IN MG/24 HR) 0.3 mg/24hr Place 1 patch onto  the skin once a week. Patient needs to apply a new patch today   . insulin glargine (LANTUS) 100 UNIT/ML injection Inject 30 Units into the skin at bedtime. As directed  . levothyroxine (SYNTHROID, LEVOTHROID) 200 MCG tablet Take 200 mcg by mouth daily before breakfast.  . Multiple Vitamins-Minerals (VISION-VITE PRESERVE PO) Take 1 tablet by mouth daily.   Marland Kitchen PROAIR HFA 108 (90 Base) MCG/ACT inhaler INHALE 1-2 PUFFS INTO THE LUNGS EVERY 8 HOURS AS NEEDED FOR WHEEZING OR SHORTNESS OF BREATH  . rosuvastatin (CRESTOR) 10 MG tablet Take 10 mg by mouth daily.    Marland Kitchen torsemide (DEMADEX) 100 MG tablet Take 50 mg by mouth daily.  Marland Kitchen zolpidem (AMBIEN) 10 MG tablet Take 10 mg by mouth at bedtime as needed for sleep.     Allergies:   Patient has no known  allergies.   Social History   Social History  . Marital status: Single    Spouse name: N/A  . Number of children: N/A  . Years of education: N/A   Occupational History  . school bus driver    Social History Main Topics  . Smoking status: Never Smoker  . Smokeless tobacco: Never Used  . Alcohol use 0.0 oz/week     Comment: occasional  . Drug use: No  . Sexual activity: No   Other Topics Concern  . None   Social History Narrative  . None     Family History: The patient's family history includes Congestive Heart Failure in her mother; Diabetes in her sister; Other in her sister; Prostate cancer in her father. ROS:   Please see the history of present illness.     All other systems reviewed and are negative.  EKGs/Labs/Other Studies Reviewed:    The following studies were reviewed today:  Holter monitor 09/09/16: Normal sinus rhythm with rare PACs and PVCs  Echocardiogram 05/05/14: EF 60-65%, normal wall motion, grade 2 diastolic dysfunction  EKG:  EKG is not ordered today.    Recent Labs: 08/26/2016: NT-Pro BNP 238; Platelets 172 09/04/2016: BUN 83; Creatinine, Ser 2.35; Potassium 5.0; Sodium 140 09/09/2016: Hemoglobin 9.6   Recent Lipid Panel No results found for: CHOL, TRIG, HDL, CHOLHDL, VLDL, LDLCALC, LDLDIRECT  Physical Exam:    VS:  BP (!) 144/86   Pulse 70   Ht 5' 9.5" (1.765 m)   Wt (!) 306 lb 3.2 oz (138.9 kg)   SpO2 99%   BMI 44.57 kg/m     Wt Readings from Last 3 Encounters:  09/13/16 (!) 306 lb 3.2 oz (138.9 kg)  09/09/16 300 lb (136.1 kg)  08/26/16 (!) 303 lb (137.4 kg)     Physical Exam  Constitutional: She is oriented to person, place, and time. She appears well-developed and well-nourished. No distress.  HENT:  Head: Normocephalic and atraumatic.  Neck: Normal range of motion. Neck supple. No JVD present. Carotid bruit is not present.  Cardiovascular: Normal rate, regular rhythm and normal heart sounds.  Exam reveals no gallop and no  friction rub.   No murmur heard. Pulmonary/Chest: Effort normal and breath sounds normal. No respiratory distress. She has no wheezes. She has no rales.  Abdominal: Soft. There is no tenderness.  Musculoskeletal: Normal range of motion. She exhibits edema.  Trace LE edema  Neurological: She is alert and oriented to person, place, and time.  Skin: Skin is warm and dry.  Psychiatric: She has a normal mood and affect. Her behavior is normal.  Vitals reviewed.  ASSESSMENT:    1. SOB (shortness of breath)   2. Palpitation   3. Chronic diastolic heart failure ([place])   4. Essential hypertension   5. CKD (chronic kidney disease), stage III   6. Hyperlipidemia, unspecified hyperlipidemia type   7. Type 2 diabetes mellitus without complication, with long-term current use of insulin (HCC)    PLAN:    In order of problems listed above:  1. Shortness of breath -The patient was seen by Dr. Tamala Julian on 08/26/16 for shortness of breath . Labs revealed a normal BNP and the patient was known to have a normal echocardiogram in the past. Her labs did reveal significant renal dysfunction and she was advised to see nephrology. She was also found to be anemic and she has now been started on Procrit injections and she received an iron infusion.  -I reviewed this case with Dr. Tamala Julian again today and it is felt that renal failure has played the biggest part in the patient's symptoms. Diuresis is to be managed by nephrology. They have advised her to resume her torsemide at half dose. She continues to hold her losartan per nephrology.   2. Palpitations -A Holter monitor was placed and showed normal sinus rhythm with PACs and PVCs. There was nothing concerning.  3. Chronic diastolic heart failure -There has been no clinical signs of volume overload. The patient's symptoms are related to her renal dysfunction.  4. essential hypertension -The patient's ARB and diuretic were placed on hold until seen by  nephrology. Nephrology has continued to hold the ARB and has resumed her torsemide at half dose. Blood pressure medications are to be adjusted by nephrology.   5. CKD  -Serum creatinine was elevated at 2.71 on 7/23 and 2.35 on 8/1. The patient was referred to nephrology who feels that she has diabetic nephropathy.   6. Hyperlipidemia -Management per PCP. Patient is on Crestor 10 mg daily  7. Diabetes type 2 on insulin -Management per PCP   Medication Adjustments/Labs and Tests Ordered: Current medicines are reviewed at length with the patient today.  Concerns regarding medicines are outlined above. Labs and tests ordered and medication changes are outlined in the patient instructions below:  There are no Patient Instructions on file for this visit.   Signed, Daune Perch, NP  09/13/2016 11:27 AM    [facility]

## 2016-09-13 ENCOUNTER — Ambulatory Visit (INDEPENDENT_AMBULATORY_CARE_PROVIDER_SITE_OTHER): Payer: Medicare Other | Admitting: Cardiology

## 2016-09-13 ENCOUNTER — Encounter: Payer: Self-pay | Admitting: Cardiology

## 2016-09-13 VITALS — BP 144/86 | HR 70 | Ht 69.5 in | Wt 306.2 lb

## 2016-09-13 DIAGNOSIS — I5032 Chronic diastolic (congestive) heart failure: Secondary | ICD-10-CM | POA: Diagnosis not present

## 2016-09-13 DIAGNOSIS — R0602 Shortness of breath: Secondary | ICD-10-CM

## 2016-09-13 DIAGNOSIS — N183 Chronic kidney disease, stage 3 unspecified: Secondary | ICD-10-CM

## 2016-09-13 DIAGNOSIS — Z794 Long term (current) use of insulin: Secondary | ICD-10-CM | POA: Diagnosis not present

## 2016-09-13 DIAGNOSIS — E119 Type 2 diabetes mellitus without complications: Secondary | ICD-10-CM

## 2016-09-13 DIAGNOSIS — E785 Hyperlipidemia, unspecified: Secondary | ICD-10-CM

## 2016-09-13 DIAGNOSIS — I10 Essential (primary) hypertension: Secondary | ICD-10-CM

## 2016-09-13 DIAGNOSIS — R002 Palpitations: Secondary | ICD-10-CM

## 2016-09-13 NOTE — Patient Instructions (Signed)
Medication Instructions:  Your physician recommends that you continue on your current medications as directed. Please refer to the Current Medication list given to you today.   Labwork: None Ordered   Testing/Procedures: None Ordered   Follow-Up: Your physician wants you to follow-up in: 1 year with Dr. Tamala Julian. You will receive a reminder letter in the mail two months in advance. If you don't receive a letter, please call our office to schedule the follow-up appointment.   Any Other Special Instructions Will Be Listed Below (If Applicable).     If you need a refill on your cardiac medications before your next appointment, please call your pharmacy.

## 2016-09-17 ENCOUNTER — Other Ambulatory Visit: Payer: Self-pay | Admitting: Internal Medicine

## 2016-09-18 NOTE — Telephone Encounter (Signed)
Ok to refill x 6 months total

## 2016-09-18 NOTE — Telephone Encounter (Signed)
CY please advise on refill. Thanks. 

## 2016-09-23 ENCOUNTER — Ambulatory Visit (HOSPITAL_COMMUNITY)
Admission: RE | Admit: 2016-09-23 | Discharge: 2016-09-23 | Disposition: A | Discharge status: Home / Self Care | Payer: Medicare Other | Source: Ambulatory Visit | Attending: Nephrology | Admitting: Nephrology

## 2016-09-23 DIAGNOSIS — N183 Chronic kidney disease, stage 3 unspecified: Secondary | ICD-10-CM

## 2016-09-23 LAB — POCT HEMOGLOBIN-HEMACUE: Hemoglobin: 10.2 g/dL — ABNORMAL LOW (ref 12.0–15.0)

## 2016-09-23 MED ORDER — SODIUM CHLORIDE 0.9 % IV SOLN
510.0000 mg | INTRAVENOUS | Status: AC
  Administered 2016-09-23: 510 mg via INTRAVENOUS
  Filled 2016-09-23: qty 17

## 2016-09-23 MED ORDER — EPOETIN ALFA 20000 UNIT/ML IJ SOLN
INTRAMUSCULAR | Status: AC
  Administered 2016-09-23: 20000 [IU] via SUBCUTANEOUS
  Filled 2016-09-23: qty 1

## 2016-09-23 MED ORDER — EPOETIN ALFA 20000 UNIT/ML IJ SOLN
20000.0000 [IU] | INTRAMUSCULAR | Status: DC
  Administered 2016-09-23: 20000 [IU] via SUBCUTANEOUS

## 2016-10-09 ENCOUNTER — Encounter (HOSPITAL_COMMUNITY)
Admission: RE | Admit: 2016-10-09 | Discharge: 2016-10-09 | Disposition: A | Discharge status: Home / Self Care | Payer: Medicare Other | Source: Ambulatory Visit | Attending: Nephrology | Admitting: Nephrology

## 2016-10-09 DIAGNOSIS — N183 Chronic kidney disease, stage 3 unspecified: Secondary | ICD-10-CM

## 2016-10-09 DIAGNOSIS — D631 Anemia in chronic kidney disease: Secondary | ICD-10-CM | POA: Insufficient documentation

## 2016-10-09 LAB — IRON AND TIBC
Iron: 110 ug/dL (ref 28–170)
Saturation Ratios: 36 % — ABNORMAL HIGH (ref 10.4–31.8)
TIBC: 308 ug/dL (ref 250–450)
UIBC: 198 ug/dL

## 2016-10-09 LAB — POCT HEMOGLOBIN-HEMACUE: Hemoglobin: 10.5 g/dL — ABNORMAL LOW (ref 12.0–15.0)

## 2016-10-09 LAB — FERRITIN: Ferritin: 421 ng/mL — ABNORMAL HIGH (ref 11–307)

## 2016-10-09 MED ORDER — EPOETIN ALFA 20000 UNIT/ML IJ SOLN
20000.0000 [IU] | INTRAMUSCULAR | Status: DC
  Administered 2016-10-09: 20000 [IU] via SUBCUTANEOUS

## 2016-10-09 MED ORDER — EPOETIN ALFA 20000 UNIT/ML IJ SOLN
INTRAMUSCULAR | Status: AC
  Filled 2016-10-09: qty 1

## 2016-10-10 ENCOUNTER — Encounter (INDEPENDENT_AMBULATORY_CARE_PROVIDER_SITE_OTHER): Payer: Self-pay | Admitting: Physical Medicine and Rehabilitation

## 2016-10-10 ENCOUNTER — Ambulatory Visit (INDEPENDENT_AMBULATORY_CARE_PROVIDER_SITE_OTHER): Payer: Medicare Other | Admitting: Physical Medicine and Rehabilitation

## 2016-10-10 ENCOUNTER — Ambulatory Visit (INDEPENDENT_AMBULATORY_CARE_PROVIDER_SITE_OTHER): Payer: Medicare Other

## 2016-10-10 DIAGNOSIS — M961 Postlaminectomy syndrome, not elsewhere classified: Secondary | ICD-10-CM

## 2016-10-10 DIAGNOSIS — M5416 Radiculopathy, lumbar region: Secondary | ICD-10-CM | POA: Diagnosis not present

## 2016-10-10 DIAGNOSIS — M5116 Intervertebral disc disorders with radiculopathy, lumbar region: Secondary | ICD-10-CM

## 2016-10-10 DIAGNOSIS — Z6835 Body mass index (BMI) 35.0-35.9, adult: Secondary | ICD-10-CM

## 2016-10-10 NOTE — Progress Notes (Deleted)
Lower back pain mostly right sided. Pain radiates down back of right leg. No numbness or tingling. Last injection with Dr. Niel Hummer was in November.

## 2016-10-23 ENCOUNTER — Encounter (HOSPITAL_COMMUNITY)
Admission: RE | Admit: 2016-10-23 | Discharge: 2016-10-23 | Disposition: A | Discharge status: Home / Self Care | Payer: Medicare Other | Source: Ambulatory Visit | Attending: Nephrology | Admitting: Nephrology

## 2016-10-23 ENCOUNTER — Ambulatory Visit (INDEPENDENT_AMBULATORY_CARE_PROVIDER_SITE_OTHER): Payer: Medicare Other | Admitting: Physical Medicine and Rehabilitation

## 2016-10-23 ENCOUNTER — Encounter (INDEPENDENT_AMBULATORY_CARE_PROVIDER_SITE_OTHER): Payer: Self-pay | Admitting: Physical Medicine and Rehabilitation

## 2016-10-23 ENCOUNTER — Ambulatory Visit (INDEPENDENT_AMBULATORY_CARE_PROVIDER_SITE_OTHER): Payer: Medicare Other

## 2016-10-23 VITALS — BP 132/84 | HR 80 | Temp 98.0°F

## 2016-10-23 DIAGNOSIS — D631 Anemia in chronic kidney disease: Secondary | ICD-10-CM | POA: Diagnosis not present

## 2016-10-23 DIAGNOSIS — M5416 Radiculopathy, lumbar region: Secondary | ICD-10-CM

## 2016-10-23 DIAGNOSIS — N183 Chronic kidney disease, stage 3 unspecified: Secondary | ICD-10-CM

## 2016-10-23 LAB — POCT HEMOGLOBIN-HEMACUE: Hemoglobin: 11 g/dL — ABNORMAL LOW (ref 12.0–15.0)

## 2016-10-23 MED ORDER — EPOETIN ALFA 20000 UNIT/ML IJ SOLN
20000.0000 [IU] | INTRAMUSCULAR | Status: DC
  Administered 2016-10-23: 20000 [IU] via SUBCUTANEOUS

## 2016-10-23 MED ORDER — EPOETIN ALFA 20000 UNIT/ML IJ SOLN
INTRAMUSCULAR | Status: AC
  Administered 2016-10-23: 20000 [IU] via SUBCUTANEOUS
  Filled 2016-10-23: qty 1

## 2016-10-23 MED ORDER — LIDOCAINE HCL (PF) 1 % IJ SOLN
2.0000 mL | Freq: Once | INTRAMUSCULAR | Status: AC
  Administered 2016-10-23: 2 mL

## 2016-10-23 MED ORDER — METHYLPREDNISOLONE ACETATE 80 MG/ML IJ SUSP
80.0000 mg | Freq: Once | INTRAMUSCULAR | Status: AC
  Administered 2016-10-23: 80 mg

## 2016-10-23 NOTE — Progress Notes (Signed)
Patient is here today for planned  right S1 transforaminal injection. No change in symptoms. 

## 2016-10-23 NOTE — Patient Instructions (Signed)

## 2016-10-23 NOTE — Progress Notes (Signed)
Tiffany [name] - 66 y.o. female MRN [id number]  Date of birth: May 31, 1950  Office Visit Note: Visit Date: 10/10/2016 [facility]: [facility] Referred by: [facility]  Subjective: Chief Complaint  Patient presents with  . Lower Back - Pain   HPI: Mr. Tebbetts is a 66 year old female with prior lumbar fusion and laminectomy at L3-4 and L4-5. She's been followed for a while by Dr. Aquilla Solian. She has received routine interventional spine injections which appear to be epidural-type injections probably transforaminal injections. She reports getting good relief with these for several months at a time. Her last injection was in November. She has tried to have records sent to [place] and we will obtain those records from Dr. Niel Hummer. The patient reports chronic worsening lower back pain mostly right sided. This refers down the back of the right leg in the hamstring past the knee into the calf. She denies any numbness or tingling however. She reports no real left-sided complaints. Her symptoms are worse with standing and ambulating. She does report some pain with hip rotation. She has had some groin pain on the right and on the left. She has not had any joint replacements. Her case is complicated medically with congestive heart failure and stage III kidney disease, significant hypertension, as well as insulin-dependent diabetes and obstructive sleep apnea. She denies any fevers chills or night sweats. She's had no specific trauma or focal weakness. She has tried to manage her pain without chronic opioid medication. With her health conditions she really is only able to take Tylenol and other mild medications. She has not been on gabapentin. She can no longer see Dr. Niel Hummer because of Dr. Niel Hummer no longer takes her insurance. Briefly, her surgical history is that she had spinal fusion at L3-4 but by Dr. Arnoldo Morale in 2003. She had prior L4-5 laminectomy. She has had multiple myelograms MRIs.  She has a transitional L5 segment. She does work as a Recruitment consultant. She reports increasing symptoms in school started.    Review of Systems  Constitutional: Negative for chills, fever, malaise/fatigue and weight loss.  HENT: Negative for hearing loss and sinus pain.   Eyes: Negative for blurred vision, double vision and photophobia.  Respiratory: Negative for cough and shortness of breath.   Cardiovascular: Negative for chest pain, palpitations and leg swelling.  Gastrointestinal: Negative for abdominal pain, nausea and vomiting.  Genitourinary: Negative for flank pain.  Musculoskeletal: Positive for back pain and joint pain. Negative for myalgias.  Skin: Negative for itching and rash.  Neurological: Positive for weakness. Negative for tremors and focal weakness.  Endo/Heme/Allergies: Negative.   Psychiatric/Behavioral: Negative for depression.  All other systems reviewed and are negative.  Otherwise per HPI.  Assessment & Plan: Visit Diagnoses:  1. Lumbar radiculopathy   2. Post laminectomy syndrome   3. Radiculopathy due to lumbar intervertebral disc disorder   4. Class 2 severe obesity due to excess calories with serious comorbidity and body mass index (BMI) of 35.0 to 35.9 in adult [facility])     Plan: Findings:  Chronic history of significant back pain and status post lumbar fusion at L3-4 and laminectomy at L4-5. MRI from 2014 was found and reviewed with the patient today and reviewed below. This shows no adjacent level disease above the fusion. This does show facet arthropathy at L4-5 with lateral recess narrowing. I think she may be having more of an issue at the L5-S1 level with lateral recess issues and may be a small disc  herniation. Clinically she has pain being up moving around but also pain with prolonged sitting which would make you think it more of a disc issue. Conversely she could have some initial bursitis. We did complete x-rays today of the pelvis because she was having some  issue with hip pain and occasional referral to the groin. Those x-rays are reviewed below but did not really show much in way of hip pathology.. I don't think she needs a new MRI at this point although that is something we need to consider given this was 2014. She has got good relief by injections with Dr. Niel Hummer. I think given her symptoms down the back of the leg which should start with an S1 transforaminal injection diagnostically and hopefully therapeutically. We will also get notes from Dr. Ace Gins. She may benefit from facet joint block as well. We discussed different medications and that may be something to look at in the future. She'll continue with current exercise program and trying to stay active. She is significantly obese which does limit the ability on the interventional procedures to a degree. She medically is very complicated as well. Fortunately she is not on a blood thinner.    Meds & Orders: No orders of the defined types were placed in this encounter.   Orders Placed This Encounter  Procedures  . XR HIP UNILAT W OR W/O PELVIS 1V LEFT    Follow-up: No Follow-up on file.   Procedures: No procedures performed  No notes on file   Clinical History: MRI LUMBAR SPINE WITHOUT CONTRAST  TECHNIQUE: Multiplanar, multisequence MR imaging was performed. No intravenous contrast was administered.  COMPARISON:  Lumbar MRI 12/11/2005 and 07/01/2007.  FINDINGS: The alignment is stable and near anatomic status post L3-4 PLIF. The interbody fusion appears solid. There is no evidence of acute fracture or pars defect.  The conus medullaris extends to the T12-L1 level and appears normal. No focal paraspinal abnormalities are identified. There is diffuse atrophy of the erector spinae musculature.  The T11-12, T12-L1 and L1-2 disc space levels appear normal.  L2-3: Disc height and hydration are maintained. There is mild bilateral facet hypertrophy. No spinal stenosis or  nerve root encroachment.  L3-4: Stable appearance status post PLIF. There is no spinal stenosis or nerve root encroachment.  L4-5: There is chronic disc degeneration with loss of height and annular bulging. There is no residual focal protrusion. The spinal canal remains adequately decompressed by the previous left laminectomy. There is mild chronic foraminal and lateral recess stenosis bilaterally which appears unchanged.  L5-S1: Stable shallow central disc protrusion, largely contained within the ventral epidural fat. There is bilateral facet hypertrophy contributing to mild narrowing of both lateral recesses. The foramina are sufficiently patent.  IMPRESSION: 1. Stable solid interbody fusion status post PLIF at L3-4. 2. Generally stable disc degeneration and facet hypertrophy at L4-5 and L5-S1 contributing to foraminal and lateral recess stenosis bilaterally. There is no residual right paracentral disc protrusion at L4-5. 3. No acute findings evident.   Electronically Signed   By: Camie Patience   On: 11/05/2012 13:51  She reports that she has never smoked. She has never used smokeless tobacco. No results for input(s): HGBA1C, LABURIC in the last 8760 hours.  Objective:  VS:  HT:    WT:   BMI:     BP:   HR: bpm  TEMP: ( )  RESP:  Physical Exam  Constitutional: She is oriented to person, place, and time. She appears well-developed and  well-nourished. No distress.  Obese  HENT:  Head: Normocephalic and atraumatic.  Nose: Nose normal.  Mouth/Throat: Oropharynx is clear and moist.  Eyes: Pupils are equal, round, and reactive to light. Conjunctivae are normal.  Neck: Normal range of motion. Neck supple.  Cardiovascular: Regular rhythm and intact distal pulses.   Pulmonary/Chest: Effort normal. No respiratory distress.  Abdominal: She exhibits no distension. There is no guarding.  Musculoskeletal:  Patient has difficulty arising from a seated position. She does have  some mechanical pain with extension rotation of the lumbar spine. She has good distal strength bilaterally without clonus. She does have some stiffness and pain with right hip rotation. This is not exquisite pain and doesn't really refer to the groin.  Neurological: She is alert and oriented to person, place, and time. She exhibits normal muscle tone. Coordination normal.  Skin: Skin is warm. No rash noted. No erythema.  Psychiatric: She has a normal mood and affect. Her behavior is normal.  Nursing note and vitals reviewed.   Ortho Exam Imaging: No results found.  Past Medical/Family/Surgical/Social History: Medications & Allergies reviewed per EMR Patient Active Problem List   Diagnosis Date Noted  . Hyperlipidemia 09/12/2016  . Type 2 diabetes mellitus without complications ([place]) 37/90/2409  . Tachycardia 08/26/2016  . Palpitation 08/26/2016  . SOB (shortness of breath) 08/26/2016  . CKD (chronic kidney disease), stage III 04/28/2014  . Chronic diastolic heart failure ([place]) 05/05/2013  . Essential hypertension 05/05/2013  . HEART MURMUR, SYSTOLIC 73/53/2992  . UNSPECIFIED HYPOTHYROIDISM 02/12/2007  . OBESITY 01/08/2007  . Obstructive sleep apnea 01/08/2007  . Seasonal and perennial allergic rhinitis 01/08/2007   Past Medical History:  Diagnosis Date  . ALLERGIC RHINITIS   . Diabetes mellitus   . Hypertension   . Hypothyroid   . Kidney disease   . Obesity   . Previous back surgery    x 2  . S/P arthroscopy   . Sleep apnea    Family History  Problem Relation Age of Onset  . Prostate cancer Father   . Congestive Heart Failure Mother   . Diabetes Sister   . Other Sister        septis   Past Surgical History:  Procedure Laterality Date  . BREAST BIOPSY  20+ yrs ago   dont know which breast per pt; benign  . BREAST REDUCTION SURGERY  1982  . KNEE SURGERY     x 2  . LUMBAR DISC SURGERY     x 2  . REDUCTION MAMMAPLASTY Bilateral 1985  . TOE SURGERY    . TOTAL  ABDOMINAL HYSTERECTOMY  1997   partial hysterectomy- still has ovaries  . TREATMENT FISTULA ANAL     Social History   Occupational History  . school bus driver    Social History Main Topics  . Smoking status: Never Smoker  . Smokeless tobacco: Never Used  . Alcohol use 0.0 oz/week     Comment: occasional  . Drug use: No  . Sexual activity: No

## 2016-10-27 ENCOUNTER — Encounter (HOSPITAL_COMMUNITY): Payer: Self-pay | Admitting: *Deleted

## 2016-10-27 ENCOUNTER — Ambulatory Visit (INDEPENDENT_AMBULATORY_CARE_PROVIDER_SITE_OTHER): Payer: Medicare Other

## 2016-10-27 ENCOUNTER — Ambulatory Visit (HOSPITAL_COMMUNITY)
Admission: EM | Admit: 2016-10-27 | Discharge: 2016-10-27 | Disposition: A | Discharge status: Home / Self Care | Payer: Medicare Other | Attending: Urgent Care | Admitting: Urgent Care

## 2016-10-27 DIAGNOSIS — R0600 Dyspnea, unspecified: Secondary | ICD-10-CM

## 2016-10-27 DIAGNOSIS — R05 Cough: Secondary | ICD-10-CM | POA: Diagnosis not present

## 2016-10-27 DIAGNOSIS — R0609 Other forms of dyspnea: Secondary | ICD-10-CM

## 2016-10-27 DIAGNOSIS — R0602 Shortness of breath: Secondary | ICD-10-CM

## 2016-10-27 DIAGNOSIS — I509 Heart failure, unspecified: Secondary | ICD-10-CM

## 2016-10-27 NOTE — ED Triage Notes (Signed)
C/O SOB over past 2-3 days; today feels like SOB is worse.  Has been coughing over past couple days.  Has been using inhaler; used just PTA.

## 2016-10-27 NOTE — ED Triage Notes (Signed)
States has been taking OTC cold meds.

## 2016-10-27 NOTE — ED Provider Notes (Signed)
MRN: [id number] DOB: [birth year]  Subjective:   Tiffany [name] is a 66 y.o. female presenting for chief complaint of Shortness of Breath  Reports 2 day history of productive cough, shortness of breath, dyspnea. Has used an inhaler that she had left over from a previous prescription with some relief. Has a history of CHF, managed with torsemide. Sleeps on 3 pillows, has not changed recently. Denies fever, chest pain, n/v, abdominal pain. Denies smoking cigarettes. Also has history of CKD, diabetes. Manages diabetes with Lantus. Managed for CKD with [facility], Dr. Florene Glen. Sees Dr. Daneen Schick for her cardiac care. Last office visit was 08/2016.  No current facility-administered medications for this encounter.   Current Outpatient Prescriptions:  .  allopurinol (ZYLOPRIM) 100 MG tablet, Take 100 mg by mouth 2 (two) times daily., Disp: , Rfl: 1 .  amLODipine (NORVASC) 10 MG tablet, Take 10 mg by mouth daily., Disp: , Rfl:  .  aspirin 81 MG tablet, Take 81 mg by mouth daily.  , Disp: , Rfl:  .  B Complex Vitamins (B COMPLEX PO), Take 1 tablet by mouth daily., Disp: , Rfl:  .  cetirizine (ZYRTEC) 10 MG tablet, Take 10 mg by mouth daily as needed for allergies., Disp: , Rfl:  .  cloNIDine (CATAPRES - DOSED IN MG/24 HR) 0.3 mg/24hr, Place 1 patch onto the skin once a week. Patient needs to apply a new patch today , Disp: , Rfl:  .  Epoetin Alfa (PROCRIT IJ), Inject as directed every 14 (fourteen) days., Disp: , Rfl:  .  insulin glargine (LANTUS) 100 UNIT/ML injection, Inject 30 Units into the skin at bedtime. As directed, Disp: , Rfl:  .  levothyroxine (SYNTHROID, LEVOTHROID) 200 MCG tablet, Take 200 mcg by mouth daily before breakfast., Disp: , Rfl:  .  PROAIR HFA 108 (90 Base) MCG/ACT inhaler, INHALE 1-2 PUFFS INTO THE LUNGS EVERY 8 HOURS AS NEEDED FOR WHEEZING OR SHORTNESS OF BREATH, Disp: , Rfl: 5 .  rosuvastatin (CRESTOR) 10 MG tablet, Take 10 mg by mouth daily.  , Disp: , Rfl:  .   torsemide (DEMADEX) 100 MG tablet, Take 50 mg by mouth daily., Disp: , Rfl:  .  zolpidem (AMBIEN) 10 MG tablet, TAKE 1 TABLET BY MOUTH AT BEDTIME AS NEEDED FOR SLEEP, Disp: 30 tablet, Rfl: 5 .  B-D INS SYRINGE 0.5CC/30GX1/2" 30G X 1/2" 0.5 ML MISC, TAKE AS DIRECTED TWICE A DAY, Disp: , Rfl: 3 .  BIOTIN 5000 PO, Take 1 capsule by mouth daily. , Disp: , Rfl:  .  losartan (COZAAR) 100 MG tablet, Take 100 mg by mouth daily., Disp: , Rfl: 6 .  Multiple Vitamins-Minerals (VISION-VITE PRESERVE PO), Take 1 tablet by mouth daily. , Disp: , Rfl:    Tiffany [name] has No Known Allergies.  Tiffany [name]  has a past medical history of ALLERGIC RHINITIS; Diabetes mellitus; Hypertension; Hypothyroid; Kidney disease; Obesity; Previous back surgery; S/P arthroscopy; and Sleep apnea. Also  has a past surgical history that includes Breast reduction surgery (1982); Lumbar disc surgery; Knee surgery; Toe Surgery; Treatment fistula anal; Total abdominal hysterectomy (1997); Breast biopsy (20+ yrs ago); and Reduction mammaplasty (Bilateral, 1985).  Objective:   Vitals: BP (!) 184/92 (BP Location: Right Arm) Comment (BP Location): large cuff  Pulse 61   Temp 98 F (36.7 C) (Oral)   Resp 18   SpO2 100%   BP Readings from Last 3 Encounters:  10/27/16 (!) 184/92  10/23/16 (!) 174/81  10/23/16 132/84  Physical Exam  Constitutional: She is oriented to person, place, and time. She appears well-developed and well-nourished.  Cardiovascular: Normal rate, regular rhythm and intact distal pulses.  Exam reveals no gallop and no friction rub.   No murmur heard. Pulmonary/Chest: No respiratory distress. She has no wheezes. She has no rales.  Musculoskeletal: She exhibits edema (1+ pitting edema of both lower legs).  Neurological: She is alert and oriented to person, place, and time.  Skin: Skin is warm and dry.  Psychiatric: She has a normal mood and affect.   Dg Chest 2 View  Result Date: 10/27/2016 CLINICAL DATA:  Dyspnea,  cough EXAM: CHEST  2 VIEW COMPARISON:  01/12/2016 FINDINGS: Cardiac enlargement with vascular congestion. Negative for edema or effusion. Negative for infiltrate IMPRESSION: Cardiac enlargement with vascular congestion. Electronically Signed   By: Franchot Gallo M.D.   On: 10/27/2016 20:33   Assessment and Plan :    Dyspnea on exertion  Shortness of breath  Chronic congestive heart failure, unspecified heart failure type ([place])  I do not suspect that patient is undergoing acute on chronic heart failure given physical exam findings and chest x-ray. Her blood pressure is concerning but she is stable with symptom of cough and dyspnea without chest pain, heart racing, abdominal pain, diaphoresis. I counseled patient that she needs to limit her fluid intake to 1 liter, no salt in her diet. Patient is to continue to take torsemide. She agreed to call her cardiologist Monday, 10/28/2016, for an urgent visit. Return-to-clinic precautions discussed, patient verbalized understanding. She is to stop using albuterol inhaler as she does not have a history of asthma, COPD.  Jaynee Eagles, PA-C [facility]  10/27/2016  7:34 PM    Jaynee Eagles, PA-C 10/28/16 1554

## 2016-10-27 NOTE — Discharge Instructions (Signed)
Please contact your cardiologist tomorrow for a recheck. If you develop chest pain, abdominal pain, heart racing or have worsening shortness of breath with walking shorter and shorter distances, please come back to our clinic or report to the ER as you may be in acute heart failure. Otherwise, limit your water intake to 1 liter daily and avoid any salt in your diet.

## 2016-10-28 NOTE — Procedures (Signed)
Mrs. Vanderstelt is a 66 year old female here for planned right S1 transforaminal epidural steroid injection. Is no change in her symptoms. Please see our prior evaluation and management note for further details and justification.The injection  will be diagnostic and hopefully therapeutic. The patient has failed conservative care including time, medications and activity modification.  S1 Lumbosacral Transforaminal Epidural Steroid Injection - Sub-Pedicular Approach with Fluoroscopic Guidance   Patient: Tiffany [name]      Date of Birth: 10/23/1950 MRN: [id number] PCP: [facility]      Visit Date: 10/23/2016   Universal Protocol:    Date/Time: 09/24/185:41 AM  Consent Given By: the patient  Position:  PRONE  Additional Comments: Vital signs were monitored before and after the procedure. Patient was prepped and draped in the usual sterile fashion. The correct patient, procedure, and site was verified.   Injection Procedure Details:  Procedure Site One Meds Administered:  Meds ordered this encounter  Medications  . lidocaine (PF) (XYLOCAINE) 1 % injection 2 mL  . methylPREDNISolone acetate (DEPO-MEDROL) injection 80 mg    Laterality: Right  Location/Site:  S1 Foramen   Needle size: 22 G  Needle type: Spinal  Needle Placement: Transforaminal  Findings:  -Contrast Used: 1 mL iohexol 180 mg iodine/mL   -Comments: Excellent flow of contrast along the nerve and into the epidural space.  Procedure Details: After squaring off the sacral end-plate to get a true AP view, the C-arm was positioned so that the best possible view of the S1 foramen was visualized. The soft tissues overlying this structure were infiltrated with 2-3 ml. of 1% Lidocaine without Epinephrine.    The spinal needle was inserted toward the target using a "trajectory" view along the fluoroscope beam.  Under AP and lateral visualization, the needle was advanced so it did not puncture dura. Biplanar projections  were used to confirm position. Aspiration was confirmed to be negative for CSF and/or blood. A 1-2 ml. volume of Isovue-250 was injected and flow of contrast was noted at each level. Radiographs were obtained for documentation purposes.   After attaining the desired flow of contrast documented above, a 0.5 to 1.0 ml test dose of 0.25% Marcaine was injected into each respective transforaminal space.  The patient was observed for 90 seconds post injection.  After no sensory deficits were reported, and normal lower extremity motor function was noted,   the above injectate was administered so that equal amounts of the injectate were placed at each foramen (level) into the transforaminal epidural space.   Additional Comments:  The patient tolerated the procedure well Dressing: Band-Aid    Post-procedure details: Patient was observed during the procedure. Post-procedure instructions were reviewed.  Patient left the clinic in stable condition.

## 2016-11-06 ENCOUNTER — Encounter (HOSPITAL_COMMUNITY)
Admission: RE | Admit: 2016-11-06 | Discharge: 2016-11-06 | Disposition: A | Discharge status: Home / Self Care | Payer: Medicare Other | Source: Ambulatory Visit | Attending: Nephrology | Admitting: Nephrology

## 2016-11-06 DIAGNOSIS — D631 Anemia in chronic kidney disease: Secondary | ICD-10-CM | POA: Diagnosis present

## 2016-11-06 DIAGNOSIS — N183 Chronic kidney disease, stage 3 unspecified: Secondary | ICD-10-CM

## 2016-11-06 LAB — IRON AND TIBC
Iron: 63 ug/dL (ref 28–170)
Saturation Ratios: 21 % (ref 10.4–31.8)
TIBC: 300 ug/dL (ref 250–450)
UIBC: 237 ug/dL

## 2016-11-06 LAB — POCT HEMOGLOBIN-HEMACUE: Hemoglobin: 11.8 g/dL — ABNORMAL LOW (ref 12.0–15.0)

## 2016-11-06 LAB — FERRITIN: Ferritin: 291 ng/mL (ref 11–307)

## 2016-11-06 MED ORDER — EPOETIN ALFA 20000 UNIT/ML IJ SOLN
INTRAMUSCULAR | Status: AC
  Administered 2016-11-06: 20000 [IU]
  Filled 2016-11-06: qty 1

## 2016-11-06 MED ORDER — EPOETIN ALFA 20000 UNIT/ML IJ SOLN: 20000.0000 [IU] | INTRAMUSCULAR | Status: DC

## 2016-11-13 ENCOUNTER — Ambulatory Visit (INDEPENDENT_AMBULATORY_CARE_PROVIDER_SITE_OTHER): Payer: Self-pay | Admitting: Physical Medicine and Rehabilitation

## 2016-11-15 ENCOUNTER — Encounter (HOSPITAL_COMMUNITY): Payer: Medicare Other

## 2016-11-20 ENCOUNTER — Encounter (HOSPITAL_COMMUNITY): Payer: Medicare Other

## 2016-11-20 ENCOUNTER — Encounter (HOSPITAL_COMMUNITY)
Admission: RE | Admit: 2016-11-20 | Discharge: 2016-11-20 | Disposition: A | Discharge status: Home / Self Care | Payer: Medicare Other | Source: Ambulatory Visit | Attending: Nephrology | Admitting: Nephrology

## 2016-11-20 DIAGNOSIS — D631 Anemia in chronic kidney disease: Secondary | ICD-10-CM | POA: Diagnosis not present

## 2016-11-20 DIAGNOSIS — N183 Chronic kidney disease, stage 3 unspecified: Secondary | ICD-10-CM

## 2016-11-20 LAB — POCT HEMOGLOBIN-HEMACUE: Hemoglobin: 11.6 g/dL — ABNORMAL LOW (ref 12.0–15.0)

## 2016-11-20 MED ORDER — EPOETIN ALFA 20000 UNIT/ML IJ SOLN
20000.0000 [IU] | INTRAMUSCULAR | Status: DC
Start: 2016-11-20 — End: 2016-11-21
  Administered 2016-11-20: 20000 [IU] via SUBCUTANEOUS

## 2016-11-20 MED ORDER — EPOETIN ALFA 20000 UNIT/ML IJ SOLN
INTRAMUSCULAR | Status: AC
  Filled 2016-11-20: qty 1

## 2016-12-04 ENCOUNTER — Encounter (HOSPITAL_COMMUNITY)
Admission: RE | Admit: 2016-12-04 | Discharge: 2016-12-04 | Disposition: A | Discharge status: Home / Self Care | Payer: Medicare Other | Source: Ambulatory Visit | Attending: Nephrology | Admitting: Nephrology

## 2016-12-04 DIAGNOSIS — N183 Chronic kidney disease, stage 3 unspecified: Secondary | ICD-10-CM

## 2016-12-04 DIAGNOSIS — D631 Anemia in chronic kidney disease: Secondary | ICD-10-CM | POA: Diagnosis not present

## 2016-12-04 LAB — POCT HEMOGLOBIN-HEMACUE: Hemoglobin: 11.6 g/dL — ABNORMAL LOW (ref 12.0–15.0)

## 2016-12-04 MED ORDER — EPOETIN ALFA 20000 UNIT/ML IJ SOLN
20000.0000 [IU] | INTRAMUSCULAR | Status: DC
  Administered 2016-12-04: 20000 [IU] via SUBCUTANEOUS

## 2016-12-04 MED ORDER — EPOETIN ALFA 20000 UNIT/ML IJ SOLN
INTRAMUSCULAR | Status: AC
  Filled 2016-12-04: qty 1

## 2016-12-18 ENCOUNTER — Encounter (HOSPITAL_COMMUNITY): Payer: Medicare Other

## 2016-12-19 ENCOUNTER — Encounter (HOSPITAL_COMMUNITY)
Admission: RE | Admit: 2016-12-19 | Discharge: 2016-12-19 | Disposition: A | Discharge status: Home / Self Care | Payer: Medicare Other | Source: Ambulatory Visit | Attending: Nephrology | Admitting: Nephrology

## 2016-12-19 VITALS — BP 151/71 | HR 59 | Temp 98.0°F | Resp 18

## 2016-12-19 DIAGNOSIS — N183 Chronic kidney disease, stage 3 unspecified: Secondary | ICD-10-CM

## 2016-12-19 DIAGNOSIS — D631 Anemia in chronic kidney disease: Secondary | ICD-10-CM | POA: Insufficient documentation

## 2016-12-19 LAB — IRON AND TIBC
Iron: 75 ug/dL (ref 28–170)
Saturation Ratios: 25 % (ref 10.4–31.8)
TIBC: 300 ug/dL (ref 250–450)
UIBC: 225 ug/dL

## 2016-12-19 LAB — FERRITIN: Ferritin: 298 ng/mL (ref 11–307)

## 2016-12-19 LAB — POCT HEMOGLOBIN-HEMACUE: Hemoglobin: 12.9 g/dL (ref 12.0–15.0)

## 2016-12-19 MED ORDER — EPOETIN ALFA 20000 UNIT/ML IJ SOLN: 20000.0000 [IU] | INTRAMUSCULAR | Status: DC

## 2017-01-02 ENCOUNTER — Encounter (HOSPITAL_COMMUNITY): Payer: Medicare Other

## 2017-01-16 ENCOUNTER — Encounter (HOSPITAL_COMMUNITY): Payer: Medicare Other

## 2017-01-22 ENCOUNTER — Encounter (HOSPITAL_COMMUNITY)
Admission: RE | Admit: 2017-01-22 | Discharge: 2017-01-22 | Disposition: A | Discharge status: Home / Self Care | Payer: Medicare Other | Source: Ambulatory Visit | Attending: Nephrology | Admitting: Nephrology

## 2017-01-22 VITALS — BP 165/74 | HR 80 | Temp 98.1°F | Resp 18

## 2017-01-22 DIAGNOSIS — N183 Chronic kidney disease, stage 3 unspecified: Secondary | ICD-10-CM

## 2017-01-22 DIAGNOSIS — D631 Anemia in chronic kidney disease: Secondary | ICD-10-CM | POA: Insufficient documentation

## 2017-01-22 LAB — IRON AND TIBC
Iron: 76 ug/dL (ref 28–170)
Saturation Ratios: 25 % (ref 10.4–31.8)
TIBC: 301 ug/dL (ref 250–450)
UIBC: 225 ug/dL

## 2017-01-22 LAB — POCT HEMOGLOBIN-HEMACUE: Hemoglobin: 11.8 g/dL — ABNORMAL LOW (ref 12.0–15.0)

## 2017-01-22 LAB — FERRITIN: Ferritin: 395 ng/mL — ABNORMAL HIGH (ref 11–307)

## 2017-01-22 MED ORDER — EPOETIN ALFA 20000 UNIT/ML IJ SOLN
30000.0000 [IU] | INTRAMUSCULAR | Status: DC
  Administered 2017-01-22: 20000 [IU] via SUBCUTANEOUS

## 2017-01-22 MED ORDER — EPOETIN ALFA 20000 UNIT/ML IJ SOLN
INTRAMUSCULAR | Status: AC
  Filled 2017-01-22: qty 1

## 2017-01-22 MED ORDER — EPOETIN ALFA 10000 UNIT/ML IJ SOLN
INTRAMUSCULAR | Status: AC
  Administered 2017-01-22: 10000 [IU]
  Filled 2017-01-22: qty 1

## 2017-02-19 ENCOUNTER — Encounter (HOSPITAL_COMMUNITY)
Admission: RE | Admit: 2017-02-19 | Discharge: 2017-02-19 | Disposition: A | Discharge status: Home / Self Care | Payer: Medicare Other | Source: Ambulatory Visit | Attending: Nephrology | Admitting: Nephrology

## 2017-02-19 VITALS — BP 151/83 | HR 72 | Temp 97.9°F | Resp 18

## 2017-02-19 DIAGNOSIS — N183 Chronic kidney disease, stage 3 unspecified: Secondary | ICD-10-CM

## 2017-02-19 DIAGNOSIS — D631 Anemia in chronic kidney disease: Secondary | ICD-10-CM | POA: Insufficient documentation

## 2017-02-19 LAB — FERRITIN: Ferritin: 345 ng/mL — ABNORMAL HIGH (ref 11–307)

## 2017-02-19 LAB — IRON AND TIBC
Iron: 57 ug/dL (ref 28–170)
Saturation Ratios: 20 % (ref 10.4–31.8)
TIBC: 283 ug/dL (ref 250–450)
UIBC: 226 ug/dL

## 2017-02-19 LAB — POCT HEMOGLOBIN-HEMACUE: Hemoglobin: 11.1 g/dL — ABNORMAL LOW (ref 12.0–15.0)

## 2017-02-19 MED ORDER — EPOETIN ALFA 10000 UNIT/ML IJ SOLN
INTRAMUSCULAR | Status: AC
  Administered 2017-02-19: 10000 [IU]
  Filled 2017-02-19: qty 1

## 2017-02-19 MED ORDER — EPOETIN ALFA 20000 UNIT/ML IJ SOLN
INTRAMUSCULAR | Status: AC
  Administered 2017-02-19: 20000 [IU]
  Filled 2017-02-19: qty 1

## 2017-02-19 MED ORDER — EPOETIN ALFA 20000 UNIT/ML IJ SOLN: 30000.0000 [IU] | INTRAMUSCULAR | Status: DC

## 2017-03-06 ENCOUNTER — Ambulatory Visit (HOSPITAL_COMMUNITY): Payer: Medicare Other

## 2017-03-18 ENCOUNTER — Other Ambulatory Visit (HOSPITAL_COMMUNITY): Payer: Self-pay

## 2017-03-19 ENCOUNTER — Ambulatory Visit (HOSPITAL_COMMUNITY)
Admission: RE | Admit: 2017-03-19 | Discharge: 2017-03-19 | Disposition: A | Discharge status: Home / Self Care | Payer: Medicare Other | Source: Ambulatory Visit | Attending: Nephrology | Admitting: Nephrology

## 2017-03-19 VITALS — BP 170/73 | HR 58 | Temp 98.5°F | Resp 18 | Ht 70.0 in | Wt 297.0 lb

## 2017-03-19 DIAGNOSIS — D631 Anemia in chronic kidney disease: Secondary | ICD-10-CM | POA: Diagnosis present

## 2017-03-19 DIAGNOSIS — N183 Chronic kidney disease, stage 3 unspecified: Secondary | ICD-10-CM

## 2017-03-19 LAB — POCT HEMOGLOBIN-HEMACUE: Hemoglobin: 10.5 g/dL — ABNORMAL LOW (ref 12.0–15.0)

## 2017-03-19 MED ORDER — EPOETIN ALFA 20000 UNIT/ML IJ SOLN
INTRAMUSCULAR | Status: AC
  Administered 2017-03-19: 20000 [IU] via SUBCUTANEOUS
  Filled 2017-03-19: qty 1

## 2017-03-19 MED ORDER — EPOETIN ALFA 20000 UNIT/ML IJ SOLN
30000.0000 [IU] | INTRAMUSCULAR | Status: DC
  Administered 2017-03-19: 20000 [IU] via SUBCUTANEOUS

## 2017-03-19 MED ORDER — EPOETIN ALFA 10000 UNIT/ML IJ SOLN
INTRAMUSCULAR | Status: AC
  Administered 2017-03-19: 10000 [IU]
  Filled 2017-03-19: qty 1

## 2017-03-19 MED ORDER — SODIUM CHLORIDE 0.9 % IV SOLN
510.0000 mg | Freq: Once | INTRAVENOUS | Status: AC
  Administered 2017-03-19: 510 mg via INTRAVENOUS
  Filled 2017-03-19: qty 17

## 2017-03-22 ENCOUNTER — Other Ambulatory Visit: Payer: Self-pay | Admitting: Internal Medicine

## 2017-03-24 NOTE — Telephone Encounter (Signed)
Dr. Lilley - please advise on refill. Thanks. 

## 2017-03-24 NOTE — Telephone Encounter (Signed)
Ok to refill total 6 months 

## 2017-04-15 ENCOUNTER — Other Ambulatory Visit (HOSPITAL_COMMUNITY): Payer: Self-pay | Admitting: *Deleted

## 2017-04-16 ENCOUNTER — Encounter (HOSPITAL_COMMUNITY)
Admission: RE | Admit: 2017-04-16 | Discharge: 2017-04-16 | Disposition: A | Discharge status: Home / Self Care | Payer: Medicare Other | Source: Ambulatory Visit | Attending: Nephrology | Admitting: Nephrology

## 2017-04-16 VITALS — BP 178/84 | HR 78 | Temp 98.0°F | Resp 18

## 2017-04-16 DIAGNOSIS — D631 Anemia in chronic kidney disease: Secondary | ICD-10-CM | POA: Insufficient documentation

## 2017-04-16 DIAGNOSIS — N183 Chronic kidney disease, stage 3 unspecified: Secondary | ICD-10-CM

## 2017-04-16 LAB — IRON AND TIBC
Iron: 77 ug/dL (ref 28–170)
Saturation Ratios: 26 % (ref 10.4–31.8)
TIBC: 295 ug/dL (ref 250–450)
UIBC: 218 ug/dL

## 2017-04-16 LAB — FERRITIN: Ferritin: 507 ng/mL — ABNORMAL HIGH (ref 11–307)

## 2017-04-16 LAB — POCT HEMOGLOBIN-HEMACUE: Hemoglobin: 11.5 g/dL — ABNORMAL LOW (ref 12.0–15.0)

## 2017-04-16 MED ORDER — EPOETIN ALFA 10000 UNIT/ML IJ SOLN
INTRAMUSCULAR | Status: AC
  Administered 2017-04-16: 10000 [IU]
  Filled 2017-04-16: qty 1

## 2017-04-16 MED ORDER — EPOETIN ALFA 20000 UNIT/ML IJ SOLN: 30000.0000 [IU] | INTRAMUSCULAR | Status: DC

## 2017-04-16 MED ORDER — EPOETIN ALFA 20000 UNIT/ML IJ SOLN
INTRAMUSCULAR | Status: AC
  Administered 2017-04-16: 20000 [IU]
  Filled 2017-04-16: qty 1

## 2017-05-14 ENCOUNTER — Ambulatory Visit (HOSPITAL_COMMUNITY)
Admission: RE | Admit: 2017-05-14 | Discharge: 2017-05-14 | Disposition: A | Discharge status: Home / Self Care | Payer: Medicare Other | Source: Ambulatory Visit | Attending: Nephrology | Admitting: Nephrology

## 2017-05-14 VITALS — BP 192/79 | HR 73 | Temp 98.3°F | Resp 20

## 2017-05-14 DIAGNOSIS — N183 Chronic kidney disease, stage 3 unspecified: Secondary | ICD-10-CM

## 2017-05-14 DIAGNOSIS — D631 Anemia in chronic kidney disease: Secondary | ICD-10-CM | POA: Diagnosis present

## 2017-05-14 LAB — IRON AND TIBC
Iron: 79 ug/dL (ref 28–170)
Saturation Ratios: 29 % (ref 10.4–31.8)
TIBC: 276 ug/dL (ref 250–450)
UIBC: 197 ug/dL

## 2017-05-14 LAB — FERRITIN: Ferritin: 442 ng/mL — ABNORMAL HIGH (ref 11–307)

## 2017-05-14 LAB — POCT HEMOGLOBIN-HEMACUE: Hemoglobin: 11.6 g/dL — ABNORMAL LOW (ref 12.0–15.0)

## 2017-05-14 MED ORDER — EPOETIN ALFA 20000 UNIT/ML IJ SOLN
INTRAMUSCULAR | Status: AC
  Filled 2017-05-14: qty 1

## 2017-05-14 MED ORDER — EPOETIN ALFA 10000 UNIT/ML IJ SOLN
INTRAMUSCULAR | Status: AC
  Administered 2017-05-14: 10000 [IU]
  Filled 2017-05-14: qty 1

## 2017-05-14 MED ORDER — EPOETIN ALFA 20000 UNIT/ML IJ SOLN
30000.0000 [IU] | INTRAMUSCULAR | Status: DC
  Administered 2017-05-14: 20000 [IU] via SUBCUTANEOUS

## 2017-06-10 ENCOUNTER — Other Ambulatory Visit (HOSPITAL_COMMUNITY): Payer: Self-pay | Admitting: *Deleted

## 2017-06-11 ENCOUNTER — Encounter: Payer: Self-pay | Admitting: Internal Medicine

## 2017-06-11 ENCOUNTER — Ambulatory Visit (HOSPITAL_COMMUNITY)
Admission: RE | Admit: 2017-06-11 | Discharge: 2017-06-11 | Disposition: A | Discharge status: Home / Self Care | Payer: Medicare Other | Source: Ambulatory Visit | Attending: Nephrology | Admitting: Nephrology

## 2017-06-11 VITALS — BP 121/67 | HR 66 | Temp 98.6°F | Resp 18 | Ht 70.0 in | Wt 287.0 lb

## 2017-06-11 DIAGNOSIS — N183 Chronic kidney disease, stage 3 unspecified: Secondary | ICD-10-CM

## 2017-06-11 DIAGNOSIS — D631 Anemia in chronic kidney disease: Secondary | ICD-10-CM | POA: Diagnosis present

## 2017-06-11 DIAGNOSIS — N189 Chronic kidney disease, unspecified: Secondary | ICD-10-CM | POA: Diagnosis present

## 2017-06-11 LAB — POCT HEMOGLOBIN-HEMACUE: Hemoglobin: 12.3 g/dL (ref 12.0–15.0)

## 2017-06-11 MED ORDER — EPOETIN ALFA 20000 UNIT/ML IJ SOLN: 20000.0000 [IU] | INTRAMUSCULAR | Status: DC

## 2017-06-11 MED ORDER — SODIUM CHLORIDE 0.9 % IV SOLN
510.0000 mg | Freq: Once | INTRAVENOUS | Status: AC
  Administered 2017-06-11: 510 mg via INTRAVENOUS
  Filled 2017-06-11: qty 17

## 2017-06-12 ENCOUNTER — Ambulatory Visit: Payer: Medicare Other | Admitting: Internal Medicine

## 2017-06-12 ENCOUNTER — Encounter: Payer: Self-pay | Admitting: Internal Medicine

## 2017-06-12 VITALS — BP 160/80 | HR 86 | Ht 70.0 in | Wt 286.2 lb

## 2017-06-12 DIAGNOSIS — G4733 Obstructive sleep apnea (adult) (pediatric): Secondary | ICD-10-CM | POA: Diagnosis not present

## 2017-06-12 NOTE — Progress Notes (Signed)
Subjective:    Patient ID: Tiffany [name], female    DOB: 07/30/1950, 67 y.o.   MRN: [id number]  HPI  female never smoker followed for allergic rhinitis, OSA, complicated by obesity, HTN, hypothyroid, DM   -----------------------------------------------------------------------------------  06/12/16- 67 year old female never smoker followed for allergic rhinitis, OSA, complicated by obesity, HTN, hypothyroid, DM CPAP 10/[facility] FOLLOW UP FOR cpap DME is [facility] patient states that she is doing very well on the cpap , no supplies needed at this time  Download confirms good compliance 93%/4 hours with good control AHI 0.3/hour. Machine is about 66 year old. She is very compliant and very comfortable, definitely feeling she benefits from using CPAP routinely. Using Zyrtec to control spring pollen rhinitis adequately.  06/12/2017- 67 year old female never smoker followed for allergic rhinitis, OSA, complicated by obesity, HTN, hypothyroid, DM CPAP 10/[facility]>> today reduce to 9  ----OSA: DME [facility] Pt wears CPAP nightly and DL attached. Pressure works well and will need order for supplies.  Download 83% compliance AHI 0.5/hour.  She says sometimes CPAP burns her nose and she takes it off.  Sounds like humidifier problem.  Exline has not needed rescue inhaler in over a year with little wheeze or cough.  We discussed BP (160/80 on arrival) and she will review with her PCP.  ROS-see HPI + = positive Constitutional:   No-   weight loss, night sweats, fevers, chills, fatigue, lassitude. HEENT:   No-  headaches, difficulty swallowing, tooth/dental problems, sore throat,       No-  sneezing, itching, ear ache, nasal congestion, post nasal drip,  CV:  No-   chest pain, orthopnea, PND, swelling in lower extremities, anasarca,  dizziness, palpitations Resp: No-   shortness of breath with exertion or at rest.              No-   productive cough,  No non-productive cough,  No- coughing up of blood.              No-    change in color of mucus.  No- wheezing.   Skin: No-   rash or lesions. GI:  No-   heartburn, indigestion, abdominal pain, nausea, vomiting,  GU:  MS:  No-   joint pain or swelling.   Neuro-     nothing unusual Psych:  No- change in mood or affect. No depression or anxiety.  No memory loss.  OBJ- Physical Exam  General- Alert, Oriented, Affect-appropriate, Distress- none acute, +obese Skin- rash-none, lesions- none, excoriation- none Lymphadenopathy- none Head- atraumatic            Eyes- Gross vision intact, PERRLA, conjunctivae and secretions clear, +strabismus            Ears- Hearing, canals-normal            Nose- Clear, no-Septal dev, mucus, polyps, erosion, perforation             Throat- Mallampati III , mucosa clear , drainage- none, tonsils- atrophic Neck- flexible , trachea midline, no stridor , thyroid nl, carotid no bruit Chest - symmetrical excursion , unlabored           Heart/CV- RRR , 1/6 systolic murmur RUSB , no gallop  , no rub, nl s1 s2                           - JVD- none , edema- none, stasis changes- none, varices- none  Lung- clear to P&A, wheeze- none, cough- none , dullness-none, rub- none           Chest wall-  Abd-  Br/ Gen/ Rectal- Not done, not indicated Extrem- cyanosis- none, clubbing, none, atrophy- none, strength- nl Neuro- grossly intact to observation

## 2017-06-12 NOTE — Patient Instructions (Signed)
Order- DME [facility]- please reduce CPAP to 9 cwp  Try turning up your humidifier a step or so if the air is too dry. You can check the pamphlet for your machine, or ask [facility] for help with this if needed.  Please call if we can help

## 2017-06-15 NOTE — Assessment & Plan Note (Signed)
She has lost a few pounds and is strongly encouraged to continue the effort.

## 2017-06-15 NOTE — Assessment & Plan Note (Signed)
We will ask her DME to reduce CPAP pressure to 9 as a way to reduce airflow, and also review adjustment of humidifier with her so she can improve comfort and compliance.  She does feel she benefits from CPAP.

## 2017-06-25 ENCOUNTER — Telehealth (INDEPENDENT_AMBULATORY_CARE_PROVIDER_SITE_OTHER): Payer: Self-pay | Admitting: Physical Medicine and Rehabilitation

## 2017-06-25 NOTE — Telephone Encounter (Signed)
Yes ok 

## 2017-06-26 NOTE — Telephone Encounter (Signed)
Can you call to schedule

## 2017-06-26 NOTE — Telephone Encounter (Signed)
Pt is scheduled for 07/21/17 at 815am, No BT, driver.

## 2017-07-09 ENCOUNTER — Encounter (HOSPITAL_COMMUNITY): Payer: Medicare Other

## 2017-07-14 ENCOUNTER — Other Ambulatory Visit: Payer: Self-pay | Admitting: Internal Medicine

## 2017-07-14 DIAGNOSIS — Z1231 Encounter for screening mammogram for malignant neoplasm of breast: Secondary | ICD-10-CM

## 2017-07-21 ENCOUNTER — Encounter (INDEPENDENT_AMBULATORY_CARE_PROVIDER_SITE_OTHER): Payer: Self-pay | Admitting: Physical Medicine and Rehabilitation

## 2017-07-21 ENCOUNTER — Ambulatory Visit (INDEPENDENT_AMBULATORY_CARE_PROVIDER_SITE_OTHER): Payer: Medicare Other | Admitting: Physical Medicine and Rehabilitation

## 2017-07-21 ENCOUNTER — Ambulatory Visit (INDEPENDENT_AMBULATORY_CARE_PROVIDER_SITE_OTHER): Payer: Medicare Other

## 2017-07-21 VITALS — BP 150/72 | HR 65

## 2017-07-21 DIAGNOSIS — Z6841 Body Mass Index (BMI) 40.0 and over, adult: Secondary | ICD-10-CM

## 2017-07-21 DIAGNOSIS — M961 Postlaminectomy syndrome, not elsewhere classified: Secondary | ICD-10-CM | POA: Diagnosis not present

## 2017-07-21 DIAGNOSIS — M5416 Radiculopathy, lumbar region: Secondary | ICD-10-CM | POA: Diagnosis not present

## 2017-07-21 MED ORDER — BETAMETHASONE SOD PHOS & ACET 6 (3-3) MG/ML IJ SUSP
12.0000 mg | Freq: Once | INTRAMUSCULAR | Status: AC
  Administered 2017-07-21: 12 mg

## 2017-07-21 NOTE — Progress Notes (Signed)
.     Numeric Pain Rating Scale and Functional Assessment Average Pain 10   In the last MONTH (on 0-10 scale) has pain interfered with the following?  1. General activity like being  able to carry out your everyday physical activities such as walking, climbing stairs, carrying groceries, or moving a chair?  Rating(7)   +Driver, -BT, -Dye Allergies.  

## 2017-07-21 NOTE — Patient Instructions (Signed)

## 2017-07-21 NOTE — Progress Notes (Signed)
Tiffany [name] - 67 y.o. female MRN [id number]  Date of birth: [birth year]  Office Visit Note: Visit Date: 07/21/2017 PCP: [name] Referred by: [name]  Subjective: Chief Complaint  [name] presents with  . Lower Back - Pain  . Left Leg - Pain  . Left Ankle - Pain  . Left Foot - Pain   HPI: Tiffany [name] is a 67 year old female that comes in today for chronic worsening low back pain with referral into Tiffany left hip and leg.  Tiffany [name] reports that it started back sometime this year with no specific injury or trauma.  By way of review Tiffany [name] is a [name] that used to see Dr. Niel [name] and could no longer see Tiffany [name] due to insurance changes.  Tiffany [name] has had prior lumbar fusion at L3-4 and laminectomy at L4-5.  These have been done remotely around 2004 by Dr. Arnoldo [name].  We saw Tiffany [name] in September 2018 and completed a right L5 transforaminal injection.  This was labeled at that time as an S1 injection but clearly when you review all of Tiffany images Tiffany L5 vertebral body is just deep set between Tiffany 2 sacroiliac joints.  Regardless Tiffany [name] did get quite a bit of relief and had almost 80% relief from Tiffany right L5 transforaminal injection.  Tiffany [name] has been having some difficulty with standing and ambulating.  Tiffany [name] has had issues with Tiffany [name] left knee.  Tiffany [name] sees Dr. Gladstone [name] at [facility] incompletes intermittent steroid injections of Tiffany knees.  Tiffany [name] also goes to Tiffany [name] for Tiffany [name] knees.  Tiffany [name] has also commenting on Tiffany left foot pain Tiffany [name] states they feel like is referred from Tiffany knee or as a result of Tiffany knee.  Tiffany [name] case is complicated by morbid class III obesity as well as class III kidney disease and Tiffany [name] has some heart failure.  Tiffany [name] is on diuretic.  Tiffany [name] is not on any blood thinners.  Tiffany [name] has had no focal weakness Tiffany [name] has had no bowel or bladder changes or red flag complaints.   Review of Systems  Constitutional: Negative for chills, fever, malaise/fatigue and weight loss.  HENT: Negative for hearing  loss and sinus pain.   Eyes: Negative for blurred vision, double vision and photophobia.  Respiratory: Negative for cough and shortness of breath.   Cardiovascular: Positive for leg swelling. Negative for chest pain and palpitations.  Gastrointestinal: Negative for abdominal pain, nausea and vomiting.  Genitourinary: Negative for flank pain.  Musculoskeletal: Positive for back pain and joint pain. Negative for myalgias.  Skin: Negative for itching and rash.  Neurological: Positive for tingling. Negative for tremors, focal weakness and weakness.  Endo/Heme/Allergies: Negative.   Psychiatric/Behavioral: Negative for depression.  All other systems reviewed and are negative.  Otherwise per HPI.  Assessment & Plan: Visit Diagnoses:  1. Lumbar radiculopathy   2. Post laminectomy syndrome   3. Class 3 severe obesity due to excess calories with serious comorbidity and body mass index (BMI) of 40.0 to 44.9 in adult [name])     Plan: Findings:  Chronic pain syndrome and chronic musculoskeletal complaints and arthritic complaints.  Tiffany [name] biggest complaint for me is left radicular type leg pain which is probably related to lateral recess narrowing.  Prior MRI imaging did not show any focal compression or adjacent level disease although this is been a few years ago now.  I do not think Tiffany [name] needs any updated imaging at this point [name] Tiffany fact that there is no red flag complaints in  Tiffany last time I saw Tiffany [name] Tiffany [name] got good relief with Tiffany injection.  We talked about Tiffany injection in terms of raising Tiffany [name] blood sugar etc.  Tiffany [name] does have significant comorbidities.  We talked about weight loss and activity level.  Tiffany [name] BMI is 41.1.  We are going to complete a left L5 transforaminal injection today.  Depending on relief would look at updating MRI of Tiffany lumbar spine.  Tiffany [name] will continue to see Dr. Gladstone [name] and Tiffany [name] for Tiffany [name] knee.  I am unsure who Tiffany [name] is seen for Tiffany [name] foot and there is some chance it could be related to  Tiffany [name] lumbar spine.  Tiffany [name] seems to think it is related to Tiffany knee.  Obviously if Tiffany [name] needs another opinion I will have Tiffany [name] see Tiffany [name] in our office.  Currently Tiffany [name] medications are adequate and we hope Tiffany [name] gets some relief with Tiffany injection.  I will see Tiffany [name] back as needed and we can make further recommendations at that point.    Meds & Orders:  Meds ordered this encounter  Medications  . betamethasone acetate-betamethasone sodium phosphate (CELESTONE) injection 12 mg    Orders Placed This Encounter  Procedures  . XR C-ARM NO REPORT  . Epidural Steroid injection    Follow-up: Return if symptoms worsen or fail to improve.   Procedures: No procedures performed  Lumbosacral Transforaminal Epidural Steroid Injection - Sub-Pedicular Approach with Fluoroscopic Guidance  [name]: Tiffany [name]      Date of Birth: [birth year] MRN: [id number] PCP: [name]      Visit Date: 07/21/2017   Universal Protocol:    Date/Time: 07/21/2017  Consent [name] By: Tiffany [name]  Position: PRONE  Additional Comments: Vital signs were monitored before and after Tiffany procedure. [name] was prepped and draped in Tiffany usual sterile fashion. Tiffany correct [name], procedure, and site was verified.   Injection Procedure Details:  Procedure Site One Meds Administered:  Meds ordered this encounter  Medications  . betamethasone acetate-betamethasone sodium phosphate (CELESTONE) injection 12 mg    Laterality: Left  Location/Site:  L5-S1  Needle size: 22 G  Needle type: Spinal  Needle Placement: Transforaminal  Findings:    -Comments: Excellent flow of contrast along Tiffany nerve and into Tiffany epidural space.  Procedure Details: After squaring off Tiffany end-plates to get a true AP view, Tiffany C-arm was positioned so that an oblique view of Tiffany foramen as noted above was visualized. Tiffany target area is just inferior to Tiffany "nose of Tiffany scotty dog" or sub pedicular. Tiffany soft tissues overlying this  structure were infiltrated with 2-3 ml. of 1% Lidocaine without Epinephrine.  Tiffany spinal needle was inserted toward Tiffany target using a "trajectory" view along Tiffany fluoroscope beam.  Under AP and lateral visualization, Tiffany needle was advanced so it did not puncture dura and was located close Tiffany 6 O'Clock position of Tiffany pedical in AP tracterory. Biplanar projections were used to confirm position. Aspiration was confirmed to be negative for CSF and/or blood. A 1-2 ml. volume of Isovue-250 was injected and flow of contrast was noted at each level. Radiographs were obtained for documentation purposes.   After attaining Tiffany desired flow of contrast documented above, a 0.5 to 1.0 ml test dose of 0.25% Marcaine was injected into each respective transforaminal space.  Tiffany [name] was observed for 90 seconds post injection.  After no sensory deficits were reported, and normal lower extremity motor function was noted,   Tiffany above injectate was administered so  that equal amounts of Tiffany injectate were placed at each foramen (level) into Tiffany transforaminal epidural space.   Additional Comments:  Tiffany [name] tolerated Tiffany procedure well Dressing: Band-Aid    Post-procedure details: [name] was observed during Tiffany procedure. Post-procedure instructions were reviewed.  [name] left Tiffany clinic in stable condition.    Clinical History: MRI LUMBAR SPINE WITHOUT CONTRAST  TECHNIQUE: Multiplanar, multisequence MR imaging was performed. No intravenous contrast was administered.  COMPARISON:  Lumbar MRI 12/11/2005 and 07/01/2007.  FINDINGS: Tiffany alignment is stable and near anatomic status post L3-4 PLIF. Tiffany interbody fusion appears solid. There is no evidence of acute fracture or pars defect.  Tiffany conus medullaris extends to Tiffany T12-L1 level and appears normal. No focal paraspinal abnormalities are identified. There is diffuse atrophy of Tiffany erector spinae musculature.  Tiffany T11-12, T12-L1 and  L1-2 disc space levels appear normal.  L2-3: Disc height and hydration are maintained. There is mild bilateral facet hypertrophy. No spinal stenosis or nerve root encroachment.  L3-4: Stable appearance status post PLIF. There is no spinal stenosis or nerve root encroachment.  L4-5: There is chronic disc degeneration with loss of height and annular bulging. There is no residual focal protrusion. Tiffany spinal canal remains adequately decompressed by Tiffany previous left laminectomy. There is mild chronic foraminal and lateral recess stenosis bilaterally which appears unchanged.  L5-S1: Stable shallow central disc protrusion, largely contained within Tiffany ventral epidural fat. There is bilateral facet hypertrophy contributing to mild narrowing of both lateral recesses. Tiffany foramina are sufficiently patent.  IMPRESSION: 1. Stable solid interbody fusion status post PLIF at L3-4. 2. Generally stable disc degeneration and facet hypertrophy at L4-5 and L5-S1 contributing to foraminal and lateral recess stenosis bilaterally. There is no residual right paracentral disc protrusion at L4-5. 3. No acute findings evident.   Electronically Signed   By: Camie Patience   On: 11/05/2012 13:51   Tiffany [name] reports that Tiffany [name] has never smoked. Tiffany [name] has never used smokeless tobacco. No results for input(s): HGBA1C, LABURIC in Tiffany last 8760 hours.  Objective:  VS:  HT:    WT:   BMI:     BP:(!) 150/72  HR:65bpm  TEMP: ( )  RESP:  Physical Exam  Constitutional: Tiffany [name] is oriented to person, place, and time. Tiffany [name] appears well-developed and well-nourished.  Obese  Eyes: Pupils are equal, round, and reactive to light. Conjunctivae are normal.  Somewhat disconjugate gaze  Cardiovascular: Normal rate and intact distal pulses.  Pulmonary/Chest: Effort normal.  Abdominal: Soft. Tiffany [name] exhibits no distension. There is no guarding.  Musculoskeletal: Tiffany [name] exhibits edema.  [name] ambulates without aid.  Tiffany [name] does have  pain with extension of Tiffany lumbar spine.  Tiffany [name] has no pain over Tiffany greater trochanters.  Tiffany [name] has no pain with hip rotation and Tiffany [name] hips move fairly fluidly.  Tiffany [name] has a negative slump test bilaterally with good distal strength.  Examination of Tiffany left knee shows some crepitus but with good varus and valgus stability.  Tiffany [name] has no joint line tenderness.  Examination of Tiffany left foot shows swelling and pedal edema quite significant bilaterally.  Tiffany [name] has good distal pulses.  Tiffany [name] has no pain over Tiffany plantar fascia.  Neurological: Tiffany [name] is alert and oriented to person, place, and time. Tiffany [name] exhibits normal muscle tone.  Skin: Skin is warm and dry. No rash noted. No erythema.  Psychiatric: Tiffany [name] has a normal mood and affect. Tiffany [name] behavior is normal.  Nursing note and vitals reviewed.   Ortho Exam  Imaging: No results found.  Past Medical/Family/Surgical/Social History: Medications & Allergies reviewed per EMR, new medications updated. [name] Active Problem List   Diagnosis Date Noted  . Hyperlipidemia 09/12/2016  . Type 2 diabetes mellitus without complications ([place]) 29/92/4268  . Tachycardia 08/26/2016  . Palpitation 08/26/2016  . SOB (shortness of breath) 08/26/2016  . CKD (chronic kidney disease), stage III ([place]) 04/28/2014  . Chronic diastolic heart failure ([place]) 05/05/2013  . Essential hypertension 05/05/2013  . HEART MURMUR, SYSTOLIC 34/19/6222  . UNSPECIFIED HYPOTHYROIDISM 02/12/2007  . OBESITY 01/08/2007  . Obstructive sleep apnea 01/08/2007  . Seasonal and perennial allergic rhinitis 01/08/2007   Past Medical History:  Diagnosis Date  . ALLERGIC RHINITIS   . Diabetes mellitus   . Hypertension   . Hypothyroid   . Kidney disease   . Obesity   . Previous back surgery    x 2  . S/P arthroscopy   . Sleep apnea    Family History  Problem Relation Age of Onset  . Prostate cancer Father   . Congestive Heart Failure Mother   . Diabetes Sister   . Other Sister        septis   Past  Surgical History:  Procedure Laterality Date  . BREAST BIOPSY  20+ yrs ago   dont know which breast per pt; benign  . BREAST REDUCTION SURGERY  1982  . KNEE SURGERY     x 2  . LUMBAR DISC SURGERY     x 2  . REDUCTION MAMMAPLASTY Bilateral 1985  . TOE SURGERY    . TOTAL ABDOMINAL HYSTERECTOMY  1997   partial hysterectomy- still has ovaries  . TREATMENT FISTULA ANAL     Social History   Occupational History  . Occupation: school bus driver  Tobacco Use  . Smoking status: Never Smoker  . Smokeless tobacco: Never Used  Substance and Sexual Activity  . Alcohol use: Yes    Alcohol/week: 0.0 oz    Comment: rare  . Drug use: No  . Sexual activity: Never    Birth control/protection: Surgical

## 2017-07-21 NOTE — Procedures (Signed)
Lumbosacral Transforaminal Epidural Steroid Injection - Sub-Pedicular Approach with Fluoroscopic Guidance  Patient: Tiffany [name]      Date of Birth: [birth year] MRN: [id number] PCP: [facility]      Visit Date: 07/21/2017   Universal Protocol:    Date/Time: 07/21/2017  Consent Given By: the patient  Position: PRONE  Additional Comments: Vital signs were monitored before and after the procedure. Patient was prepped and draped in the usual sterile fashion. The correct patient, procedure, and site was verified.   Injection Procedure Details:  Procedure Site One Meds Administered:  Meds ordered this encounter  Medications  . betamethasone acetate-betamethasone sodium phosphate (CELESTONE) injection 12 mg    Laterality: Left  Location/Site:  L5-S1  Needle size: 22 G  Needle type: Spinal  Needle Placement: Transforaminal  Findings:    -Comments: Excellent flow of contrast along the nerve and into the epidural space.  Procedure Details: After squaring off the end-plates to get a true AP view, the C-arm was positioned so that an oblique view of the foramen as noted above was visualized. The target area is just inferior to the "nose of the scotty dog" or sub pedicular. The soft tissues overlying this structure were infiltrated with 2-3 ml. of 1% Lidocaine without Epinephrine.  The spinal needle was inserted toward the target using a "trajectory" view along the fluoroscope beam.  Under AP and lateral visualization, the needle was advanced so it did not puncture dura and was located close the 6 O'Clock position of the pedical in AP tracterory. Biplanar projections were used to confirm position. Aspiration was confirmed to be negative for CSF and/or blood. A 1-2 ml. volume of Isovue-250 was injected and flow of contrast was noted at each level. Radiographs were obtained for documentation purposes.   After attaining the desired flow of contrast documented above, a 0.5 to 1.0  ml test dose of 0.25% Marcaine was injected into each respective transforaminal space.  The patient was observed for 90 seconds post injection.  After no sensory deficits were reported, and normal lower extremity motor function was noted,   the above injectate was administered so that equal amounts of the injectate were placed at each foramen (level) into the transforaminal epidural space.   Additional Comments:  The patient tolerated the procedure well Dressing: Band-Aid    Post-procedure details: Patient was observed during the procedure. Post-procedure instructions were reviewed.  Patient left the clinic in stable condition.

## 2017-08-11 ENCOUNTER — Encounter: Payer: Self-pay | Admitting: Nurse Practitioner

## 2017-08-14 ENCOUNTER — Ambulatory Visit
Admission: RE | Admit: 2017-08-14 | Discharge: 2017-08-14 | Disposition: A | Discharge status: Home / Self Care | Payer: Medicare Other | Source: Ambulatory Visit | Attending: Internal Medicine | Admitting: Internal Medicine

## 2017-08-14 DIAGNOSIS — Z1231 Encounter for screening mammogram for malignant neoplasm of breast: Secondary | ICD-10-CM

## 2017-08-26 ENCOUNTER — Encounter: Payer: Self-pay | Admitting: Nurse Practitioner

## 2017-08-26 ENCOUNTER — Ambulatory Visit: Payer: Medicare Other | Admitting: Nurse Practitioner

## 2017-08-26 VITALS — BP 170/80 | HR 80 | Ht 69.5 in | Wt 282.8 lb

## 2017-08-26 DIAGNOSIS — R002 Palpitations: Secondary | ICD-10-CM | POA: Diagnosis not present

## 2017-08-26 DIAGNOSIS — I5032 Chronic diastolic (congestive) heart failure: Secondary | ICD-10-CM

## 2017-08-26 NOTE — Progress Notes (Signed)
CARDIOLOGY OFFICE NOTE  Date:  08/26/2017    Tiffany [name] Date of Birth: 1950/06/13 Medical Record #[id number]  PCP:  Nolene Ebbs, MD  Cardiologist:  Jennings Books    Chief Complaint  Patient presents with  . Congestive Heart Failure  . Hypertension    Follow up visit - seen for Dr. Tamala Julian    History of Present Illness: Tiffany [name] is a 67 y.o. female who presents today for a one year check. Seen for Dr. Tamala Julian.   She has a history of diastolic HF, HTN, DM, anemia and CKD stage III. She is a Restaurant manager, fast food. Past monitor has shown rare PACs and PVCs. She has had marked obesity - weight up to 390 in the past.   She was last seen a little over a year ago by Dr. Tamala Julian - she was having some fatigue and palpitations. She has had progressive CKD - followed by nephrology. She had her ARB stopped. On follow up with Daune Perch, NP she was improved.   Comes in today. Here alone. I was way behind - probably reflective of her BP. She has had a really good year for the most part. Has had some fleeting heart racing. She has joined a gym. She is exercising every day. She has lost weight. She says "I just take some Tylenol and keep moving". Seems to have made lots of diet changes. BP typically fair at other offices. Labs from May noted. A1C is 6.9. No chest pain. She has stable DOE. Last echo with just mild MR and diastolic dysfunction. Overall, she is pretty happy with how she is doing.   Past Medical History:  Diagnosis Date  . ALLERGIC RHINITIS   . Diabetes mellitus   . Hypertension   . Hypothyroid   . Kidney disease   . Obesity   . Previous back surgery    x 2  . S/P arthroscopy   . Sleep apnea     Past Surgical History:  Procedure Laterality Date  . BREAST BIOPSY  20+ yrs ago   dont know which breast per pt; benign  . BREAST REDUCTION SURGERY  1982  . KNEE SURGERY     x 2  . LUMBAR DISC SURGERY     x 2  . REDUCTION MAMMAPLASTY Bilateral 1985  . TOE  SURGERY    . TOTAL ABDOMINAL HYSTERECTOMY  1997   partial hysterectomy- still has ovaries  . TREATMENT FISTULA ANAL       Medications: Current Meds  Medication Sig  . allopurinol (ZYLOPRIM) 100 MG tablet Take 100 mg by mouth 2 (two) times daily.  Marland Kitchen amLODipine (NORVASC) 10 MG tablet Take 10 mg by mouth 2 (two) times daily.   Marland Kitchen aspirin 81 MG tablet Take 81 mg by mouth daily.    . B Complex Vitamins (B COMPLEX PO) Take 1 tablet by mouth daily.  . B-D INS SYRINGE 0.5CC/30GX1/2" 30G X 1/2" 0.5 ML MISC TAKE AS DIRECTED TWICE A DAY  . BIOTIN 5000 PO Take 1 capsule by mouth daily.   . cetirizine (ZYRTEC) 10 MG tablet Take 10 mg by mouth daily as needed for allergies.  . cloNIDine (CATAPRES - DOSED IN MG/24 HR) 0.3 mg/24hr Place 1 patch onto the skin once a week. Patient needs to apply a new patch today   . ergocalciferol (VITAMIN D2) 50000 units capsule ergocalciferol (vitamin D2) 50,000 unit capsule  1 (ONE) CAPSULE EVERY WEEK  . insulin glargine (LANTUS)  100 UNIT/ML injection Inject 20 Units into the skin at bedtime. As directed  . levothyroxine (SYNTHROID, LEVOTHROID) 200 MCG tablet Take 200 mcg by mouth daily before breakfast.  . Multiple Vitamins-Minerals (VISION-VITE PRESERVE PO) Take 1 tablet by mouth daily.   . rosuvastatin (CRESTOR) 10 MG tablet Take 10 mg by mouth daily.    Marland Kitchen torsemide (DEMADEX) 100 MG tablet Take 100 mg by mouth daily.   Marland Kitchen zolpidem (AMBIEN) 10 MG tablet TAKE 1 TABLET AT BEDTIME AS NEEDED     Allergies: No Known Allergies  Social History: The patient  reports that she has never smoked. She has never used smokeless tobacco. She reports that she drinks alcohol. She reports that she does not use drugs.   Family History: The patient's family history includes Congestive Heart Failure in her mother; Diabetes in her sister; Other in her sister; Prostate cancer in her father.   Review of Systems: Please see the history of present illness.   Otherwise, the review of  systems is positive for none.   All other systems are reviewed and negative.   Physical Exam: VS:  BP (!) 170/80 (BP Location: Left Wrist, Patient Position: Sitting, Cuff Size: Normal)   Pulse 80   Ht 5' 9.5" (1.765 m)   Wt 282 lb 12.8 oz (128.3 kg)   BMI 41.16 kg/m  .  BMI Body mass index is 41.16 kg/m.  Wt Readings from Last 3 Encounters:  08/26/17 282 lb 12.8 oz (128.3 kg)  06/12/17 286 lb 3.2 oz (129.8 kg)  06/11/17 287 lb (130.2 kg)    General: Pleasant. Well developed, well nourished and in no acute distress. Her weight is down from 306 here a year ago.   HEENT: Normal.  Neck: Supple, no JVD, carotid bruits, or masses noted.  Cardiac: Regular rate and rhythm. Outflow murmur noted.  No edema.  Respiratory:  Lungs are clear to auscultation bilaterally with normal work of breathing.  GI: Soft and nontender.  MS: No deformity or atrophy. Gait and ROM intact.  Skin: Warm and dry. Color is normal.  Neuro:  Strength and sensation are intact and no gross focal deficits noted.  Psych: Alert, appropriate and with normal affect.   LABORATORY DATA:  EKG:  EKG is ordered today. This demonstrates NSR.  Lab Results  Component Value Date   WBC 7.3 08/26/2016   HGB 12.3 06/11/2017   HCT 29.5 (L) 08/26/2016   PLT 172 08/26/2016   GLUCOSE 83 09/04/2016   NA 140 09/04/2016   K 5.0 09/04/2016   CL 99 09/04/2016   CREATININE 2.35 (H) 09/04/2016   BUN 83 (HH) 09/04/2016   CO2 21 09/04/2016   INR 1.0 05/05/2008        BNP (last 3 results) No results for input(s): BNP in the last 8760 hours.  ProBNP (last 3 results) Recent Labs    08/26/16 1649  PROBNP 238     Other Studies Reviewed Today:   Echo Study Conclusions 2016  - Left ventricle: The cavity size was normal. Wall thickness was increased in a pattern of moderate LVH. Systolic function was normal. The estimated ejection fraction was in the range of 60% to 65%. Wall motion was normal; there were no  regional wall motion abnormalities. Features are consistent with a pseudonormal left ventricular filling pattern, with concomitant abnormal relaxation and increased filling pressure (grade 2 diastolic dysfunction). - Mitral valve: There was mild regurgitation    Assessment/Plan:  1. Diastolic dysfunction - she is  working on weight loss and is using less salt.   2. Heart racing - she has had prior monitoring  3. Hypothyroidism - she is telling me that she is taking her thyroid medicine twice a day - will recheck TSH  4. Morbid obesity - she is exercising. She has lost over 25 pounds. Encouragement given.   5. CKD - followed by nephrology  6. HLD - labs noted  7. DM - recent A1C noted.   8. HTN - BP meds mostly handled by Ren[facility] - she has had better outpatient control. No changes made today.   Current medicines are reviewed with the patient today.  The patient does not have concerns regarding medicines other than what has been noted above.  The following changes have been made:  See above.  Labs/ tests ordered today include:    Orders Placed This Encounter  Procedures  . TSH  . Basic metabolic panel  . EKG 12-Lead     Disposition:   FU with Dr. Tamala Julian in 6 months.   Patient is agreeable to this plan and will call if any problems develop in the interim.   SignedTruitt Merle, NP  08/26/2017 4:09 PM  [facility] [address] [place], [place]  [address] Phone: [phone] Fax: [phone]

## 2017-08-26 NOTE — Patient Instructions (Addendum)
We will be checking the following labs today - BMET and TSH   Medication Instructions:    Continue with your current medicines.     Testing/Procedures To Be Arranged:  N/A  Follow-Up:   See Dr. Tamala Julian in 6 months    Other Special Instructions:   Keep up the good work!!!    If you need a refill on your cardiac medications before your next appointment, please call your pharmacy.   Call the [place] office at [phone] if you have any questions, problems or concerns.

## 2017-08-27 LAB — TSH: TSH: 0.058 u[IU]/mL — ABNORMAL LOW (ref 0.450–4.500)

## 2017-08-27 LAB — BASIC METABOLIC PANEL
BUN/Creatinine Ratio: 34 — ABNORMAL HIGH (ref 12–28)
BUN: 89 mg/dL (ref 8–27)
CO2: 21 mmol/L (ref 20–29)
Calcium: 9.6 mg/dL (ref 8.7–10.3)
Chloride: 101 mmol/L (ref 96–106)
Creatinine, Ser: 2.58 mg/dL — ABNORMAL HIGH (ref 0.57–1.00)
GFR calc Af Amer: 22 mL/min/{1.73_m2} — ABNORMAL LOW (ref >59)
GFR calc non Af Amer: 19 mL/min/{1.73_m2} — ABNORMAL LOW (ref >59)
Glucose: 400 mg/dL — ABNORMAL HIGH (ref 65–99)
Potassium: 5 mmol/L (ref 3.5–5.2)
Sodium: 138 mmol/L (ref 134–144)

## 2017-08-28 ENCOUNTER — Telehealth: Payer: Self-pay | Admitting: Nurse Practitioner

## 2017-08-28 NOTE — Telephone Encounter (Signed)
New Message   Pt returning call about labs

## 2017-09-16 ENCOUNTER — Telehealth (INDEPENDENT_AMBULATORY_CARE_PROVIDER_SITE_OTHER): Payer: Self-pay | Admitting: Physical Medicine and Rehabilitation

## 2017-09-16 DIAGNOSIS — M5416 Radiculopathy, lumbar region: Secondary | ICD-10-CM

## 2017-09-16 DIAGNOSIS — M961 Postlaminectomy syndrome, not elsewhere classified: Secondary | ICD-10-CM

## 2017-09-16 NOTE — Telephone Encounter (Signed)
Really need new lspine MRI, diagnosis lumbar radic and post laminectomy syndrome

## 2017-09-16 NOTE — Telephone Encounter (Signed)
MRI ordered. Patient notified

## 2017-09-27 ENCOUNTER — Ambulatory Visit
Admission: RE | Admit: 2017-09-27 | Discharge: 2017-09-27 | Disposition: A | Discharge status: Home / Self Care | Payer: Medicare Other | Source: Ambulatory Visit | Attending: Physical Medicine and Rehabilitation | Admitting: Physical Medicine and Rehabilitation

## 2017-09-27 DIAGNOSIS — M961 Postlaminectomy syndrome, not elsewhere classified: Secondary | ICD-10-CM

## 2017-09-27 DIAGNOSIS — M5416 Radiculopathy, lumbar region: Secondary | ICD-10-CM

## 2017-10-08 ENCOUNTER — Ambulatory Visit (INDEPENDENT_AMBULATORY_CARE_PROVIDER_SITE_OTHER): Payer: Self-pay

## 2017-10-08 ENCOUNTER — Ambulatory Visit (INDEPENDENT_AMBULATORY_CARE_PROVIDER_SITE_OTHER): Payer: Medicare Other | Admitting: Physical Medicine and Rehabilitation

## 2017-10-08 ENCOUNTER — Encounter (INDEPENDENT_AMBULATORY_CARE_PROVIDER_SITE_OTHER): Payer: Self-pay | Admitting: Physical Medicine and Rehabilitation

## 2017-10-08 VITALS — BP 156/75 | HR 70 | Temp 98.0°F

## 2017-10-08 DIAGNOSIS — M47816 Spondylosis without myelopathy or radiculopathy, lumbar region: Secondary | ICD-10-CM | POA: Diagnosis not present

## 2017-10-08 DIAGNOSIS — M961 Postlaminectomy syndrome, not elsewhere classified: Secondary | ICD-10-CM | POA: Diagnosis not present

## 2017-10-08 DIAGNOSIS — G894 Chronic pain syndrome: Secondary | ICD-10-CM

## 2017-10-08 MED ORDER — METHYLPREDNISOLONE ACETATE 80 MG/ML IJ SUSP
80.0000 mg | Freq: Once | INTRAMUSCULAR | Status: AC
  Administered 2017-10-08: 80 mg

## 2017-10-08 NOTE — Patient Instructions (Signed)

## 2017-10-08 NOTE — Progress Notes (Signed)
 .  Numeric Pain Rating Scale and Functional Assessment Average Pain 8   In the last MONTH (on 0-10 scale) has pain interfered with the following?  1. General activity like being  able to carry out your everyday physical activities such as walking, climbing stairs, carrying groceries, or moving a chair?  Rating(6)   +Driver, -BT, -Dye Allergies.  

## 2017-10-24 NOTE — Progress Notes (Signed)
Tiffany [name] - 67 y.o. female MRN [id number]  Date of birth: April 21, 1950  Office Visit Note: Visit Date: 10/08/2017 PCP: Nolene Ebbs, MD Referred by: Nolene Ebbs, MD  Subjective: Chief Complaint  Tiffany [name] presents with  . Left Leg - Pain  . Lower Back - Pain   HPI: Tiffany [name] is a 67 year old female that comes in today for MRI review and possible interventional spine injection.  Please see our prior note in June for further details of her complete history.  She has had lumbar fusion at L3-4 and laminectomy at L4-5.  We had seen her in the past for right L5 transforaminal injection that worked extremely well and so she did return to [place] when she began having more low back and some referral in the left hip and leg.  We completed left-sided injection which was not very beneficial for the back pain but did seem to help her hip a little bit.  We did get a new MRI of the lumbar spine because of the timing on the prior MRI.  This was reviewed with her today using spine models and the imaging itself.  She has had progressive worsening of adjacent level disease at L2-3 with gaping facet joints that could change with flexion extension.  No real central canal narrowing.  Tiffany [name]'s back pain is really worse than the referral pain.  She rates her pain as an 8 out of 10.  I think the best approach is diagnostic and hopefully therapeutic facet joint blocks at L2-3.  Prior injection at L5-S1 really was not beneficial left much for her ongoing pain although she does have a central disc protrusion here without compression.  Depending on relief may look at flexion-extension films and possible surgical referral.  Depending on the flexion-extension films and ongoing pain could be potentially a spinal cord stimulator trial Tiffany [name].   ROS Otherwise per HPI.  Assessment & Plan: Visit Diagnoses:  1. Spondylosis without myelopathy or radiculopathy, lumbar region   2. Post laminectomy syndrome   3. Chronic pain  syndrome     Plan: No additional findings.   Meds & Orders:  Meds ordered this encounter  Medications  . methylPREDNISolone acetate (DEPO-MEDROL) injection 80 mg    Orders Placed This Encounter  Procedures  . Facet Injection  . XR C-ARM NO REPORT    Follow-up: Return if symptoms worsen or fail to improve, for Consider flex/ext films and L2 vs L4 transforaminal epidural steroid injection.   Procedures: No procedures performed  Lumbar Facet Joint Intra-Articular Injection(s) with Fluoroscopic Guidance  Tiffany [name]: Tiffany [name]      Date of Birth: April 24, 1950 MRN: [id number] PCP: Nolene Ebbs, MD      Visit Date: 10/08/2017   Universal Protocol:    Date/Time: 10/08/2017  Consent Given By: the Tiffany [name]  Position: PRONE   Additional Comments: Vital signs were monitored before and after the procedure. Tiffany [name] was prepped and draped in the usual sterile fashion. The correct Tiffany [name], procedure, and site was verified.   Injection Procedure Details:  Procedure Site One Meds Administered:  Meds ordered this encounter  Medications  . methylPREDNISolone acetate (DEPO-MEDROL) injection 80 mg     Laterality: Bilateral  Location/Site:  L2-L3  Needle size: 22 guage  Needle type: Spinal  Needle Placement: Articular  Findings:  -Comments: Excellent flow of contrast producing a partial arthrogram.  Procedure Details: The fluoroscope beam is vertically oriented in AP, and the inferior recess is visualized beneath the lower pole of the  inferior apophyseal process, which represents the target point for needle insertion. When direct visualization is difficult the target point is located at the medial projection of the vertebral pedicle. The region overlying each aforementioned target is locally anesthetized with a 1 to 2 ml. volume of 1% Lidocaine without Epinephrine.   The spinal needle was inserted into each of the above mentioned facet joints using biplanar fluoroscopic  guidance. A 0.25 to 0.5 ml. volume of Isovue-250 was injected and a partial facet joint arthrogram was obtained. A single spot film was obtained of the resulting arthrogram.    One to 1.25 ml of the steroid/anesthetic solution was then injected into each of the facet joints noted above.   Additional Comments:  The Tiffany [name] tolerated the procedure well Dressing: Band-Aid    Post-procedure details: Tiffany [name] was observed during the procedure. Post-procedure instructions were reviewed.  Tiffany [name] left the clinic in stable condition.     Clinical History: MRI LUMBAR SPINE WITHOUT CONTRAST  TECHNIQUE: Multiplanar, multisequence MR imaging of the lumbar spine was performed. No intravenous contrast was administered.  COMPARISON: 11/05/2012  FINDINGS: Segmentation: 5 lumbar type vertebral bodies assumed.  Alignment: No malalignment.  Vertebrae: No fracture or primary bone lesion.  Conus medullaris and cauda equina: Conus extends to the L1 level. Conus and cauda equina appear normal.  Paraspinal and other soft tissues: Simple appearing renal cysts.  Disc levels:  Degenerative disc disease at T9-10 and T10-11 with disc bulges but no apparent compressive stenosis. Those levels were not studied in detail.  T12-L1 and L1-2 are normal.  L2-3: Mild bulging of the disc. Facet degeneration with ligamentous hypertrophy and joint effusions. Mild narrowing of the canal but no compressive stenosis in this position. This appearance could worsen with standing or flexion, based on the morphology of the facet arthropathy. This has worsened since 2014.  L3-4: Previous PLIF is solid with wide patency of the canal and foramina.  L4-5: Previous left hemilaminectomy. Endplate osteophytes and mild bulging of the disc. No compressive stenosis.  L5-S1: Central disc protrusion contacts the thecal sac in the S1 root sleeves as they bud from the thecal sac, though definite  neural compression is not demonstrated. Foramina appear sufficiently patent. Mild bilateral facet osteoarthritis. Similar appearance to the study of 2014.  IMPRESSION: Since 2014, there has been worsening of adjacent segment degenerative disease at L2-3. The disc bulges mildly. There is bilateral facet arthropathy with ligamentous hypertrophy and fluid-filled joints. There is mild stenosis of the canal in this position, but this appearance would likely worsen with standing or flexion.  Good appearance at the previous discectomy and fusion level of L3-4.  Satisfactory appearance at the previous surgical level of L4-5 with previous left hemilaminectomy. Mild degenerative changes without compressive stenosis.  L5-S1 shows a chronic central disc protrusion and mild facet degeneration but no apparent compressive stenosis or change since,2014.   Electronically Signed By: Nelson Chimes M.D. On: 09/28/2017 07:00   She reports that she has never smoked. She has never used smokeless tobacco. No results for input(s): HGBA1C, LABURIC in the last 8760 hours.  Objective:  VS:  HT:    WT:   BMI:     BP:(!) 156/75  HR:70bpm  TEMP:98 F (36.7 C)(Oral)  RESP:98 % Physical Exam  Ortho Exam Imaging: No results found.  Past Medical/Family/Surgical/Social History: Medications & Allergies reviewed per EMR, new medications updated. Tiffany [name] Active Problem List   Diagnosis Date Noted  . Hyperlipidemia 09/12/2016  . Type 2 diabetes mellitus without  complications ([place]) 01/60/1093  . Tachycardia 08/26/2016  . Palpitation 08/26/2016  . SOB (shortness of breath) 08/26/2016  . CKD (chronic kidney disease), stage III ([place]) 04/28/2014  . Chronic diastolic heart failure ([place]) 05/05/2013  . Essential hypertension 05/05/2013  . HEART MURMUR, SYSTOLIC 23/55/7322  . UNSPECIFIED HYPOTHYROIDISM 02/12/2007  . OBESITY 01/08/2007  . Obstructive sleep apnea 01/08/2007  . Seasonal and perennial  allergic rhinitis 01/08/2007   Past Medical History:  Diagnosis Date  . ALLERGIC RHINITIS   . Diabetes mellitus   . Hypertension   . Hypothyroid   . Kidney disease   . Obesity   . Previous back surgery    x 2  . S/P arthroscopy   . Sleep apnea    Family History  Problem Relation Age of Onset  . Prostate cancer Father   . Congestive Heart Failure Mother   . Diabetes Sister   . Other Sister        septis  . Breast cancer Neg Hx    Past Surgical History:  Procedure Laterality Date  . BREAST BIOPSY  20+ yrs ago   dont know which breast per pt; benign  . BREAST REDUCTION SURGERY  1982  . KNEE SURGERY     x 2  . LUMBAR DISC SURGERY     x 2  . REDUCTION MAMMAPLASTY Bilateral 1985  . TOE SURGERY    . TOTAL ABDOMINAL HYSTERECTOMY  1997   partial hysterectomy- still has ovaries  . TREATMENT FISTULA ANAL     Social History   Occupational History  . Occupation: school bus driver  Tobacco Use  . Smoking status: Never Smoker  . Smokeless tobacco: Never Used  Substance and Sexual Activity  . Alcohol use: Yes    Alcohol/week: 0.0 standard drinks    Comment: rare  . Drug use: No  . Sexual activity: Never    Birth control/protection: Surgical

## 2017-10-24 NOTE — Procedures (Signed)
Lumbar Facet Joint Intra-Articular Injection(s) with Fluoroscopic Guidance  Patient: Tiffany [name]      Date of Birth: 15-Jun-1950 MRN: [id number] PCP: Nolene Ebbs, MD      Visit Date: 10/08/2017   Universal Protocol:    Date/Time: 10/08/2017  Consent Given By: the patient  Position: PRONE   Additional Comments: Vital signs were monitored before and after the procedure. Patient was prepped and draped in the usual sterile fashion. The correct patient, procedure, and site was verified.   Injection Procedure Details:  Procedure Site One Meds Administered:  Meds ordered this encounter  Medications  . methylPREDNISolone acetate (DEPO-MEDROL) injection 80 mg     Laterality: Bilateral  Location/Site:  L2-L3  Needle size: 22 guage  Needle type: Spinal  Needle Placement: Articular  Findings:  -Comments: Excellent flow of contrast producing a partial arthrogram.  Procedure Details: The fluoroscope beam is vertically oriented in AP, and the inferior recess is visualized beneath the lower pole of the inferior apophyseal process, which represents the target point for needle insertion. When direct visualization is difficult the target point is located at the medial projection of the vertebral pedicle. The region overlying each aforementioned target is locally anesthetized with a 1 to 2 ml. volume of 1% Lidocaine without Epinephrine.   The spinal needle was inserted into each of the above mentioned facet joints using biplanar fluoroscopic guidance. A 0.25 to 0.5 ml. volume of Isovue-250 was injected and a partial facet joint arthrogram was obtained. A single spot film was obtained of the resulting arthrogram.    One to 1.25 ml of the steroid/anesthetic solution was then injected into each of the facet joints noted above.   Additional Comments:  The patient tolerated the procedure well Dressing: Band-Aid    Post-procedure details: Patient was observed during the  procedure. Post-procedure instructions were reviewed.  Patient left the clinic in stable condition.

## 2017-11-19 ENCOUNTER — Telehealth: Payer: Self-pay | Admitting: Internal Medicine

## 2017-11-19 NOTE — Telephone Encounter (Signed)
Called and spoke with pt to verify the information that she was needing to go to [place] with [employer].  After verifying the information, I verified the fax number.  Faxed the information over in attention [place]. Nothing further needed.

## 2017-11-26 ENCOUNTER — Other Ambulatory Visit: Payer: Self-pay | Admitting: Internal Medicine

## 2017-11-27 NOTE — Telephone Encounter (Signed)
Ok to refill total 6 months. I should see her sometime in next few months to renew chart.

## 2017-11-27 NOTE — Telephone Encounter (Signed)
Dr. Annamaria Boots, please advise if it is okay to refill med for pt. Thanks! Also, please advise if we can send any refills in for pt's med. Thanks!  No Known Allergies   Current Outpatient Medications:  .  allopurinol (ZYLOPRIM) 100 MG tablet, Take 100 mg by mouth 2 (two) times daily., Disp: , Rfl: 1 .  amLODipine (NORVASC) 10 MG tablet, Take 10 mg by mouth 2 (two) times daily. , Disp: , Rfl:  .  aspirin 81 MG tablet, Take 81 mg by mouth daily.  , Disp: , Rfl:  .  B Complex Vitamins (B COMPLEX PO), Take 1 tablet by mouth daily., Disp: , Rfl:  .  B-D INS SYRINGE 0.5CC/30GX1/2" 30G X 1/2" 0.5 ML MISC, TAKE AS DIRECTED TWICE A DAY, Disp: , Rfl: 3 .  BIOTIN 5000 PO, Take 1 capsule by mouth daily. , Disp: , Rfl:  .  cetirizine (ZYRTEC) 10 MG tablet, Take 10 mg by mouth daily as needed for allergies., Disp: , Rfl:  .  cloNIDine (CATAPRES - DOSED IN MG/24 HR) 0.3 mg/24hr, Place 1 patch onto the skin once a week. Patient needs to apply a new patch today , Disp: , Rfl:  .  ergocalciferol (VITAMIN D2) 50000 units capsule, ergocalciferol (vitamin D2) 50,000 unit capsule  1 (ONE) CAPSULE EVERY WEEK, Disp: , Rfl:  .  insulin glargine (LANTUS) 100 UNIT/ML injection, Inject 20 Units into the skin at bedtime. As directed, Disp: , Rfl:  .  levothyroxine (SYNTHROID, LEVOTHROID) 200 MCG tablet, Take 200 mcg by mouth daily before breakfast., Disp: , Rfl:  .  Multiple Vitamins-Minerals (VISION-VITE PRESERVE PO), Take 1 tablet by mouth daily. , Disp: , Rfl:  .  rosuvastatin (CRESTOR) 10 MG tablet, Take 10 mg by mouth daily.  , Disp: , Rfl:  .  torsemide (DEMADEX) 100 MG tablet, Take 100 mg by mouth daily. , Disp: , Rfl:  .  zolpidem (AMBIEN) 10 MG tablet, TAKE 1 TABLET AT BEDTIME AS NEEDED, Disp: 30 tablet, Rfl: 5

## 2017-11-27 NOTE — Telephone Encounter (Signed)
Rx was called to [facility] for pt to scheduled sooner appt

## 2017-12-01 ENCOUNTER — Telehealth: Payer: Self-pay | Admitting: Internal Medicine

## 2017-12-01 NOTE — Telephone Encounter (Signed)
Called and spoke to patient, needs refill of Ambien, Called [facility], refill has been ready for four days. Left message informing patient Ambien has been ready for 4 days and she could go pick it up. Nothing further is needed at this time.

## 2018-02-20 ENCOUNTER — Telehealth: Payer: Self-pay | Admitting: Interventional Cardiology

## 2018-02-20 NOTE — Telephone Encounter (Signed)
Spoke with pt and she states for a couple of weeks now she has noticed that she gets SOB when walking even short distances.  She compared the distance as walking from the inside of our building to the parking lot causes the SOB.  Stops and takes deep breaths and it resolves after a few mins.  Denies CP, lightheaded, dizziness, HA, nausea, or chest tightness.  Didn't have BP readings but states it is always fine.  Pt hoping to get sooner appt.  Advised soonest I could get with Dr. Tamala Julian is 1/31.  Pt agreeable to see APP so she can be seen sooner.  Scheduled pt to see Richardson Dopp, PA-C on 1/22.

## 2018-02-20 NOTE — Telephone Encounter (Signed)
New massage:    Please call patient about a sooner appt.

## 2018-02-20 NOTE — Telephone Encounter (Signed)
Left message to call back  

## 2018-02-25 ENCOUNTER — Ambulatory Visit: Payer: Medicare Other | Admitting: Physician Assistant

## 2018-02-25 ENCOUNTER — Encounter: Payer: Self-pay | Admitting: Physician Assistant

## 2018-02-25 VITALS — BP 160/62 | HR 72 | Ht 70.0 in | Wt 289.4 lb

## 2018-02-25 DIAGNOSIS — N183 Chronic kidney disease, stage 3 unspecified: Secondary | ICD-10-CM

## 2018-02-25 DIAGNOSIS — D649 Anemia, unspecified: Secondary | ICD-10-CM

## 2018-02-25 DIAGNOSIS — R0602 Shortness of breath: Secondary | ICD-10-CM

## 2018-02-25 DIAGNOSIS — I10 Essential (primary) hypertension: Secondary | ICD-10-CM | POA: Diagnosis not present

## 2018-02-25 DIAGNOSIS — I5032 Chronic diastolic (congestive) heart failure: Secondary | ICD-10-CM | POA: Diagnosis not present

## 2018-02-25 NOTE — Progress Notes (Signed)
Cardiology Office Note:    Date:  02/25/2018   ID:  [name], DOB 29-Apr-1950, MRN [id number]  PCP:  Nolene Ebbs, MD  Cardiologist:  Sinclair Grooms, MD   Electrophysiologist:  None   Referring MD: Nolene Ebbs, MD   Chief Complaint  Patient presents with  . Shortness of Breath     History of Present Illness:    Tiffany [name] is a 68 y.o. female with diastolic heart failure, chronic kidney disease, hypertension, obesity, diabetes.  She was last seen in clinic by Truitt Merle, NP in 08/2017.  She called in recently with complaints of shortness of breath.   Tiffany [name] returns for evaluation of shortness of breath.  She has been more short of breath with exertion since 11/2017. It has progressively worsened.  She notes symptoms with minimal activity.  She notes some chest heaviness when she is short of breath and notes lightheadedness.  She has not passed out.   She sleeps on an incline.  She uses CPAP at night.  Her weights at home have been stable.  She has not had any bleeding issues.  She has been coughing and wheezing. Her endocrinologist put her on an inhaler.    Prior CV studies:   The following studies were reviewed today:  24-hour Holter 09/09/2016  NSR  Rare PAC's and PVC's.  Normal study  Echocardiogram 05/04/2014 Moderate LVH, EF 60-65, normal wall motion, grade 2 diastolic dysfunction, mild MR  Cardiac catheterization 09/14/2003 EF 50 LM normal LAD distal 50-60 (near apex) LCx normal RCA normal  Past Medical History:  Diagnosis Date  . ALLERGIC RHINITIS   . Diabetes mellitus   . Hypertension   . Hypothyroid   . Kidney disease   . Obesity   . Previous back surgery    x 2  . S/P arthroscopy   . Sleep apnea    Surgical Hx: The patient  has a past surgical history that includes Breast reduction surgery (1982); Lumbar disc surgery; Knee surgery; Toe Surgery; Treatment fistula anal; Total abdominal hysterectomy (1997); Breast biopsy (20+ yrs ago);  and Reduction mammaplasty (Bilateral, 1985).   Current Medications: Current Meds  Medication Sig  . allopurinol (ZYLOPRIM) 100 MG tablet Take 100 mg by mouth 2 (two) times daily.  Marland Kitchen amLODipine (NORVASC) 10 MG tablet Take 10 mg by mouth 2 (two) times daily.   Marland Kitchen aspirin 81 MG tablet Take 81 mg by mouth daily.    . B Complex Vitamins (B COMPLEX PO) Take 1 tablet by mouth daily.  . B-D INS SYRINGE 0.5CC/30GX1/2" 30G X 1/2" 0.5 ML MISC TAKE AS DIRECTED TWICE A DAY  . BIOTIN 5000 PO Take 1 capsule by mouth daily.   . calcitRIOL (ROCALTROL) 0.25 MCG capsule Take 0.25 mcg by mouth daily.  . cetirizine (ZYRTEC) 10 MG tablet Take 10 mg by mouth daily as needed for allergies.  . cloNIDine (CATAPRES - DOSED IN MG/24 HR) 0.3 mg/24hr Place 1 patch onto the skin once a week. Patient needs to apply a new patch today   . diclofenac sodium (VOLTAREN) 1 % GEL APPLY 2 GRAMS TO THE AFFECTED AREA(S) BY TOPICAL ROUTE 4 TIMES PER DAY  . doxazosin (CARDURA) 1 MG tablet Take 1 mg by mouth at bedtime.  . Fluticasone-Umeclidin-Vilant (TRELEGY ELLIPTA) 100-62.5-25 MCG/INH AEPB Inhale 1 puff into the lungs as needed (SOB).  . insulin glargine (LANTUS) 100 UNIT/ML injection Inject 30 Units into the skin at bedtime. As directed  .  levothyroxine (LEVO-T) 112 MCG tablet Take 112 mcg by mouth 2 (two) times daily.  . Multiple Vitamins-Minerals (VISION-VITE PRESERVE PO) Take 1 tablet by mouth daily.   . rosuvastatin (CRESTOR) 10 MG tablet Take 10 mg by mouth daily.    Marland Kitchen torsemide (DEMADEX) 100 MG tablet Take 100 mg by mouth daily.   Marland Kitchen zolpidem (AMBIEN) 10 MG tablet TAKE 1 TABLET AT BEDTIME AS NEEDED  . [DISCONTINUED] ergocalciferol (VITAMIN D2) 50000 units capsule ergocalciferol (vitamin D2) 50,000 unit capsule  1 (ONE) CAPSULE EVERY WEEK  . [DISCONTINUED] levothyroxine (SYNTHROID, LEVOTHROID) 200 MCG tablet Take 200 mcg by mouth daily before breakfast.     Allergies:   Patient has no known allergies.   Social History    Tobacco Use  . Smoking status: Never Smoker  . Smokeless tobacco: Never Used  Substance Use Topics  . Alcohol use: Yes    Alcohol/week: 0.0 standard drinks    Comment: rare  . Drug use: No     Family Hx: The patient's family history includes Congestive Heart Failure in her mother; Diabetes in her sister; Other in her sister; Prostate cancer in her father. There is no history of Breast cancer.  ROS:   Please see the history of present illness.    Review of Systems  Cardiovascular: Positive for chest pain, irregular heartbeat and leg swelling.  Respiratory: Positive for shortness of breath, snoring and wheezing.   Hematologic/Lymphatic: Bruises/bleeds easily.  Musculoskeletal: Positive for joint swelling.  Neurological: Positive for headaches.   All other systems reviewed and are negative.   EKGs/Labs/Other Test Reviewed:    EKG:  EKG is  ordered today.  The ekg ordered today demonstrates normal sinus rhythm, HR 72, LAD, QTc 444, no ischemic changes  Recent Labs: 06/11/2017: Hemoglobin 12.3 08/26/2017: BUN 89; Creatinine, Ser 2.58; Potassium 5.0; Sodium 138; TSH 0.058   Recent Lipid Panel No results found for: CHOL, TRIG, HDL, CHOLHDL, LDLCALC, LDLDIRECT    Physical Exam:    VS:  BP (!) 160/62   Pulse 72   Ht 5\' 10"  (1.778 m)   Wt 289 lb 6.4 oz (131.3 kg)   SpO2 99%   BMI 41.52 kg/m     Wt Readings from Last 3 Encounters:  02/25/18 289 lb 6.4 oz (131.3 kg)  08/26/17 282 lb 12.8 oz (128.3 kg)  06/12/17 286 lb 3.2 oz (129.8 kg)     Physical Exam  Constitutional: She is oriented to person, place, and time. She appears well-developed and well-nourished. No distress.  HENT:  Head: Normocephalic and atraumatic.  Eyes: No scleral icterus.  Neck: No JVD present. No thyromegaly present.  Cardiovascular: Normal rate and regular rhythm.  Murmur heard.  Holosystolic murmur is present with a grade of 2/6 at the upper left sternal border. Pulmonary/Chest: Effort normal.  She has no wheezes. She has no rales.  Abdominal: Soft.  Musculoskeletal:        General: Edema (1-2+ bilat LE edema) present.  Lymphadenopathy:    She has no cervical adenopathy.  Neurological: She is alert and oriented to person, place, and time.  Skin: Skin is warm and dry.  Psychiatric: She has a normal mood and affect.    ASSESSMENT & PLAN:    Shortness of breath  I suspect her shortness of breath is multifactorial and related to diastolic heart failure, anemia, deconditioning and obesity.  Her anemia is probably driving most of this.  She does have some evidence of volume excess on exam.  Given  her advanced kidney disease, I am somewhat hesitant to increase her diuretics further.  I will obtain a BMET, CBC, BNP today.  If her BNP is elevated, I will increase her torsemide.  If her hemoglobin is worse, she will need earlier follow-up with nephrology and primary care.  She does have some chest discomfort.  Her ECG is normal.  I doubt that her symptoms are mediated by ischemia.  I suppose she could have demand ischemia in the setting of anemia.  She would be high risk for contrast-induced nephropathy with cardiac catheterization.  Therefore, I do not believe that stress testing would be helpful.  I discussed this with her today.  I will obtain an echocardiogram to rule out worsening LV function or new wall motion abnormalities and evaluate her murmur.  -BMET, CBC, BNP  -Increase torsemide if BNP elevated  -Obtain echocardiogram  -Follow-up with Dr. Tamala Julian 2/7 as planned  Chronic diastolic heart failure ([place])  As noted, a BNP will be obtained today.  I will adjust her torsemide versus add metolazone if her BNP is significantly elevated.  Obtain echocardiogram.  Essential hypertension  Blood pressure above target.  Continue to monitor.  Adjust diuretics if necessary.  CKD (chronic kidney disease), stage III (HCC) Recent creatinine was 3.05.  She was previously followed by Dr. Florene Glen.  She  has an appointment with Dr. Johnney Ou 03/23/2018.  If her creatinine is worse or her hemoglobin is lower, I will see if she can return for earlier follow-up.  Anemia, unspecified type   I suspect that this is related to chronic disease.  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  She may need iron infusions or Epogen.  As noted, if her hemoglobin is worse, I will request earlier follow-up with nephrology.  Dispo:  Return in 16 days (on 03/13/2018) for Scheduled Follow Up with Dr. Tamala Julian.   Medication Adjustments/Labs and Tests Ordered: Current medicines are reviewed at length with the patient today.  Concerns regarding medicines are outlined above.  Tests Ordered: Orders Placed This Encounter  Procedures  . Basic metabolic panel  . CBC  . Pro b natriuretic peptide (BNP)  . EKG 12-Lead  . ECHOCARDIOGRAM COMPLETE   Medication Changes: No orders of the defined types were placed in this encounter.   Signed, Richardson Dopp, PA-C  02/25/2018 11:20 AM    [facility] [place], [place], [place]  [address] Phone: [phone]; Fax: [phone]

## 2018-02-25 NOTE — Patient Instructions (Signed)
Medication Instructions:  Your physician recommends that you continue on your current medications as directed. Please refer to the Current Medication list given to you today.   If you need a refill on your cardiac medications before your next appointment, please call your pharmacy.   Lab work: TODAY: BMET, CBC, BNP  If you have labs (blood work) drawn today and your tests are completely normal, you will receive your results only by: Marland Kitchen MyChart Message (if you have MyChart) OR . A paper copy in the mail If you have any lab test that is abnormal or we need to change your treatment, we will call you to review the results.  Testing/Procedures: Your physician has requested that you have an echocardiogram. Echocardiography is a painless test that uses sound waves to create images of your heart. It provides your doctor with information about the size and shape of your heart and how well your heart's chambers and valves are working. This procedure takes approximately one hour. There are no restrictions for this procedure.   Follow-Up: At [facility], you and your health needs are our priority.  As part of our continuing mission to provide you with exceptional heart care, we have created designated Provider Care Teams.  These Care Teams include your primary Cardiologist (physician) and Advanced Practice Providers (APPs -  Physician Assistants and Nurse Practitioners) who all work together to provide you with the care you need, when you need it. Marland Kitchen [place] ON 03/13/18 @ 9:20 AM.  Any Other Special Instructions Will Be Listed Below (If Applicable).

## 2018-02-26 ENCOUNTER — Telehealth: Payer: Self-pay | Admitting: *Deleted

## 2018-02-26 DIAGNOSIS — N183 Chronic kidney disease, stage 3 unspecified: Secondary | ICD-10-CM

## 2018-02-26 DIAGNOSIS — I5032 Chronic diastolic (congestive) heart failure: Secondary | ICD-10-CM

## 2018-02-26 LAB — BASIC METABOLIC PANEL WITH GFR
BUN/Creatinine Ratio: 29 — ABNORMAL HIGH (ref 12–28)
BUN: 87 mg/dL (ref 8–27)
CO2: 18 mmol/L — ABNORMAL LOW (ref 20–29)
Calcium: 9.6 mg/dL (ref 8.7–10.3)
Chloride: 105 mmol/L (ref 96–106)
Creatinine, Ser: 3.03 mg/dL — ABNORMAL HIGH (ref 0.57–1.00)
GFR calc Af Amer: 18 mL/min/1.73 — ABNORMAL LOW
GFR calc non Af Amer: 15 mL/min/1.73 — ABNORMAL LOW
Glucose: 134 mg/dL — ABNORMAL HIGH (ref 65–99)
Potassium: 4.5 mmol/L (ref 3.5–5.2)
Sodium: 142 mmol/L (ref 134–144)

## 2018-02-26 LAB — CBC
Hematocrit: 27.8 % — ABNORMAL LOW (ref 34.0–46.6)
Hemoglobin: 8.7 g/dL — ABNORMAL LOW (ref 11.1–15.9)
MCH: 26.4 pg — ABNORMAL LOW (ref 26.6–33.0)
MCHC: 31.3 g/dL — ABNORMAL LOW (ref 31.5–35.7)
MCV: 85 fL (ref 79–97)
Platelets: 223 10*3/uL (ref 150–450)
RBC: 3.29 x10E6/uL — ABNORMAL LOW (ref 3.77–5.28)
RDW: 17.8 % — ABNORMAL HIGH (ref 11.7–15.4)
WBC: 8.5 10*3/uL (ref 3.4–10.8)

## 2018-02-26 LAB — PRO B NATRIURETIC PEPTIDE: NT-Pro BNP: 727 pg/mL — ABNORMAL HIGH (ref 0–301)

## 2018-02-26 NOTE — Telephone Encounter (Signed)
-----   Message from Liliane Shi, PA-C sent at 02/26/2018  8:43 AM EST ----- Creatinine stable.  K+ normal.  Hgb stable.  BNP mildly elevated. Recommendations:  - I think her anemia is the biggest thing contributing to her dyspnea.  - We can try increasing her Torsemide a little to see if this helps.  - Take Torsemide 100 mg in A and take 1/2 tab (50 mg) in the afternoon today; repeat on Saturday 1.25.2020 (2 total doses only).  - Otherwise continue Torsemide 100 mg QD.  - BMET, BNP 1 week  - Send copy to Nephrologist and PCP.  - Please contact Nephrologist office to see if she can get an earlier appt. Richardson Dopp, PA-C    02/26/2018 8:19 AM

## 2018-02-26 NOTE — Telephone Encounter (Signed)
Patient notified of result.  Please refer to phone note from today for complete details.   Julaine Hua, [facility] 02/26/2018 10:17 AM   Pt advised of recommendations to increase Torsemide to 100 mg inh the AM and 50 mg in the PM today (1/23) and then again repeat this increase on Saturday (1/25). Pt aware all other days she will remain on her regular dose of Torsemide 100 mg once a day. Pt advised needs sooner Nephro appt.   I advised pt I will call Dr. Caprice Red office [facility] for a sooner appt. I have s/w Dr. Caprice Red CMA who asked for labs to be faxed and she will show these to Dr. Johnney Ou and they will call the pt with a sooner appt. Pt is agreeable to plan of care and lab work to be done on 03/05/18 for bmet, pro bnp.

## 2018-03-04 ENCOUNTER — Other Ambulatory Visit (HOSPITAL_COMMUNITY): Payer: Medicare Other

## 2018-03-05 ENCOUNTER — Encounter (HOSPITAL_COMMUNITY): Payer: Self-pay

## 2018-03-05 ENCOUNTER — Ambulatory Visit (HOSPITAL_COMMUNITY)
Admission: RE | Admit: 2018-03-05 | Discharge: 2018-03-05 | Disposition: A | Discharge status: Home / Self Care | Payer: Medicare Other | Source: Ambulatory Visit | Attending: Internal Medicine | Admitting: Internal Medicine

## 2018-03-05 ENCOUNTER — Other Ambulatory Visit: Payer: Medicare Other | Admitting: *Deleted

## 2018-03-05 VITALS — BP 162/85 | HR 72 | Temp 98.1°F | Resp 20

## 2018-03-05 DIAGNOSIS — N183 Chronic kidney disease, stage 3 unspecified: Secondary | ICD-10-CM

## 2018-03-05 DIAGNOSIS — I5032 Chronic diastolic (congestive) heart failure: Secondary | ICD-10-CM

## 2018-03-05 LAB — IRON AND TIBC
Iron: 35 ug/dL (ref 28–170)
Saturation Ratios: 14 % (ref 10.4–31.8)
TIBC: 251 ug/dL (ref 250–450)
UIBC: 216 ug/dL

## 2018-03-05 LAB — BASIC METABOLIC PANEL
BUN/Creatinine Ratio: 27 (ref 12–28)
BUN: 79 mg/dL (ref 8–27)
CO2: 18 mmol/L — ABNORMAL LOW (ref 20–29)
Calcium: 9.3 mg/dL (ref 8.7–10.3)
Chloride: 104 mmol/L (ref 96–106)
Creatinine, Ser: 2.97 mg/dL — ABNORMAL HIGH (ref 0.57–1.00)
GFR calc Af Amer: 18 mL/min/{1.73_m2} — ABNORMAL LOW (ref >59)
GFR calc non Af Amer: 16 mL/min/{1.73_m2} — ABNORMAL LOW (ref >59)
Glucose: 154 mg/dL — ABNORMAL HIGH (ref 65–99)
Potassium: 4.2 mmol/L (ref 3.5–5.2)
Sodium: 142 mmol/L (ref 134–144)

## 2018-03-05 LAB — PRO B NATRIURETIC PEPTIDE: NT-Pro BNP: 584 pg/mL — ABNORMAL HIGH (ref 0–301)

## 2018-03-05 LAB — POCT HEMOGLOBIN-HEMACUE: Hemoglobin: 7.9 g/dL — ABNORMAL LOW (ref 12.0–15.0)

## 2018-03-05 LAB — FERRITIN: Ferritin: 502 ng/mL — ABNORMAL HIGH (ref 11–307)

## 2018-03-05 MED ORDER — EPOETIN ALFA-EPBX 10000 UNIT/ML IJ SOLN
20000.0000 [IU] | INTRAMUSCULAR | Status: DC
  Administered 2018-03-05: 20000 [IU] via SUBCUTANEOUS
  Filled 2018-03-05: qty 2

## 2018-03-06 ENCOUNTER — Encounter (HOSPITAL_COMMUNITY): Payer: Medicare Other

## 2018-03-06 ENCOUNTER — Telehealth: Payer: Self-pay

## 2018-03-06 NOTE — Telephone Encounter (Signed)
-----   Message from Burtis Junes, NP sent at 03/05/2018  5:28 PM EST ----- Reviewed for Richardson Dopp, PA Fluid level is stable given her age and known kidney disease   Kidney function is basically unchanged - was she able to get an earlier visit with her kidney doctor? Copy to Nephrologist please.  Stay on current regimen from our standpoint.

## 2018-03-06 NOTE — Telephone Encounter (Signed)
Reviewed results with patient who verbalized understanding. Pt states that she was able to see her Nephrologist last week. I have sent a copy to [facility].

## 2018-03-07 NOTE — Telephone Encounter (Signed)
Noted  

## 2018-03-10 ENCOUNTER — Inpatient Hospital Stay (HOSPITAL_COMMUNITY): Admission: RE | Admit: 2018-03-10 | Payer: Medicare Other | Source: Ambulatory Visit

## 2018-03-11 ENCOUNTER — Encounter: Payer: Self-pay | Admitting: Physician Assistant

## 2018-03-11 ENCOUNTER — Ambulatory Visit (HOSPITAL_COMMUNITY): Payer: Medicare Other | Attending: Cardiovascular Disease

## 2018-03-11 DIAGNOSIS — R0602 Shortness of breath: Secondary | ICD-10-CM | POA: Diagnosis not present

## 2018-03-12 ENCOUNTER — Other Ambulatory Visit: Payer: Self-pay | Admitting: Family Medicine

## 2018-03-12 ENCOUNTER — Ambulatory Visit: Payer: Self-pay

## 2018-03-12 DIAGNOSIS — M25511 Pain in right shoulder: Secondary | ICD-10-CM

## 2018-03-13 ENCOUNTER — Ambulatory Visit: Payer: Medicare Other | Admitting: Interventional Cardiology

## 2018-03-18 ENCOUNTER — Other Ambulatory Visit (HOSPITAL_COMMUNITY): Payer: Self-pay | Admitting: *Deleted

## 2018-03-19 ENCOUNTER — Ambulatory Visit (HOSPITAL_COMMUNITY)
Admission: RE | Admit: 2018-03-19 | Discharge: 2018-03-19 | Disposition: A | Discharge status: Home / Self Care | Payer: Medicare Other | Source: Ambulatory Visit | Attending: Internal Medicine | Admitting: Internal Medicine

## 2018-03-19 VITALS — BP 156/75 | HR 66 | Temp 98.6°F | Resp 20 | Ht 70.0 in | Wt 289.0 lb

## 2018-03-19 DIAGNOSIS — N183 Chronic kidney disease, stage 3 unspecified: Secondary | ICD-10-CM

## 2018-03-19 LAB — POCT HEMOGLOBIN-HEMACUE: Hemoglobin: 7.9 g/dL — ABNORMAL LOW (ref 12.0–15.0)

## 2018-03-19 MED ORDER — SODIUM CHLORIDE 0.9 % IV SOLN
510.0000 mg | INTRAVENOUS | Status: DC
  Administered 2018-03-19: 510 mg via INTRAVENOUS
  Filled 2018-03-19: qty 510

## 2018-03-19 MED ORDER — EPOETIN ALFA-EPBX 10000 UNIT/ML IJ SOLN
20000.0000 [IU] | INTRAMUSCULAR | Status: DC
  Administered 2018-03-19: 20000 [IU] via SUBCUTANEOUS
  Filled 2018-03-19: qty 2

## 2018-03-19 NOTE — Discharge Instructions (Signed)

## 2018-03-26 ENCOUNTER — Encounter (HOSPITAL_COMMUNITY): Payer: Self-pay

## 2018-03-27 ENCOUNTER — Ambulatory Visit (HOSPITAL_COMMUNITY)
Admission: RE | Admit: 2018-03-27 | Discharge: 2018-03-27 | Disposition: A | Discharge status: Home / Self Care | Payer: Medicare Other | Source: Ambulatory Visit | Attending: Internal Medicine | Admitting: Internal Medicine

## 2018-03-27 DIAGNOSIS — D631 Anemia in chronic kidney disease: Secondary | ICD-10-CM | POA: Insufficient documentation

## 2018-03-27 DIAGNOSIS — N189 Chronic kidney disease, unspecified: Secondary | ICD-10-CM | POA: Diagnosis not present

## 2018-03-27 MED ORDER — SODIUM CHLORIDE 0.9 % IV SOLN
510.0000 mg | INTRAVENOUS | Status: DC
  Administered 2018-03-27: 510 mg via INTRAVENOUS
  Filled 2018-03-27: qty 510

## 2018-04-02 ENCOUNTER — Encounter (HOSPITAL_COMMUNITY): Payer: Self-pay

## 2018-04-02 ENCOUNTER — Ambulatory Visit (HOSPITAL_COMMUNITY)
Admission: RE | Admit: 2018-04-02 | Discharge: 2018-04-02 | Disposition: A | Discharge status: Home / Self Care | Payer: Medicare Other | Source: Ambulatory Visit | Attending: Internal Medicine | Admitting: Internal Medicine

## 2018-04-02 VITALS — BP 155/78 | HR 70 | Temp 98.2°F | Resp 20

## 2018-04-02 DIAGNOSIS — N183 Chronic kidney disease, stage 3 unspecified: Secondary | ICD-10-CM

## 2018-04-02 LAB — IRON AND TIBC
Iron: 54 ug/dL (ref 28–170)
Saturation Ratios: 21 % (ref 10.4–31.8)
TIBC: 260 ug/dL (ref 250–450)
UIBC: 206 ug/dL

## 2018-04-02 LAB — FERRITIN: Ferritin: 802 ng/mL — ABNORMAL HIGH (ref 11–307)

## 2018-04-02 LAB — POCT HEMOGLOBIN-HEMACUE: Hemoglobin: 8.8 g/dL — ABNORMAL LOW (ref 12.0–15.0)

## 2018-04-02 MED ORDER — EPOETIN ALFA-EPBX 10000 UNIT/ML IJ SOLN
20000.0000 [IU] | INTRAMUSCULAR | Status: DC
  Administered 2018-04-02: 20000 [IU] via SUBCUTANEOUS
  Filled 2018-04-02: qty 2

## 2018-04-03 ENCOUNTER — Encounter (HOSPITAL_COMMUNITY): Payer: Self-pay

## 2018-04-16 ENCOUNTER — Ambulatory Visit (HOSPITAL_COMMUNITY)
Admission: RE | Admit: 2018-04-16 | Discharge: 2018-04-16 | Disposition: A | Discharge status: Home / Self Care | Payer: Medicare Other | Source: Ambulatory Visit | Attending: Internal Medicine | Admitting: Internal Medicine

## 2018-04-16 ENCOUNTER — Other Ambulatory Visit: Payer: Self-pay

## 2018-04-16 VITALS — BP 155/87 | HR 65 | Temp 98.0°F | Resp 20

## 2018-04-16 DIAGNOSIS — N183 Chronic kidney disease, stage 3 unspecified: Secondary | ICD-10-CM

## 2018-04-16 LAB — POCT HEMOGLOBIN-HEMACUE: Hemoglobin: 9.1 g/dL — ABNORMAL LOW (ref 12.0–15.0)

## 2018-04-16 MED ORDER — EPOETIN ALFA-EPBX 10000 UNIT/ML IJ SOLN
20000.0000 [IU] | INTRAMUSCULAR | Status: DC
  Administered 2018-04-16: 20000 [IU] via SUBCUTANEOUS
  Filled 2018-04-16: qty 2

## 2018-04-29 ENCOUNTER — Ambulatory Visit (HOSPITAL_COMMUNITY)
Admission: RE | Admit: 2018-04-29 | Discharge: 2018-04-29 | Disposition: A | Discharge status: Home / Self Care | Payer: Medicare Other | Source: Ambulatory Visit | Attending: Internal Medicine | Admitting: Internal Medicine

## 2018-04-29 ENCOUNTER — Other Ambulatory Visit: Payer: Self-pay

## 2018-04-29 VITALS — BP 180/77 | HR 73 | Temp 98.0°F | Resp 20

## 2018-04-29 DIAGNOSIS — Z79899 Other long term (current) drug therapy: Secondary | ICD-10-CM | POA: Insufficient documentation

## 2018-04-29 DIAGNOSIS — N183 Chronic kidney disease, stage 3 unspecified: Secondary | ICD-10-CM

## 2018-04-29 DIAGNOSIS — D631 Anemia in chronic kidney disease: Secondary | ICD-10-CM | POA: Diagnosis not present

## 2018-04-29 DIAGNOSIS — N184 Chronic kidney disease, stage 4 (severe): Secondary | ICD-10-CM | POA: Insufficient documentation

## 2018-04-29 DIAGNOSIS — Z5181 Encounter for therapeutic drug level monitoring: Secondary | ICD-10-CM | POA: Insufficient documentation

## 2018-04-29 LAB — FERRITIN: Ferritin: 826 ng/mL — ABNORMAL HIGH (ref 11–307)

## 2018-04-29 LAB — IRON AND TIBC
Iron: 53 ug/dL (ref 28–170)
Saturation Ratios: 21 % (ref 10.4–31.8)
TIBC: 253 ug/dL (ref 250–450)
UIBC: 200 ug/dL

## 2018-04-29 LAB — POCT HEMOGLOBIN-HEMACUE: Hemoglobin: 9.8 g/dL — ABNORMAL LOW (ref 12.0–15.0)

## 2018-04-29 MED ORDER — EPOETIN ALFA-EPBX 10000 UNIT/ML IJ SOLN
20000.0000 [IU] | INTRAMUSCULAR | Status: DC
  Administered 2018-04-29: 20000 [IU] via SUBCUTANEOUS
  Filled 2018-04-29: qty 2

## 2018-04-30 ENCOUNTER — Encounter (HOSPITAL_COMMUNITY): Payer: Self-pay

## 2018-05-13 ENCOUNTER — Other Ambulatory Visit: Payer: Self-pay

## 2018-05-13 ENCOUNTER — Encounter (HOSPITAL_COMMUNITY)
Admission: RE | Admit: 2018-05-13 | Discharge: 2018-05-13 | Disposition: A | Discharge status: Home / Self Care | Payer: Medicare Other | Source: Ambulatory Visit | Attending: Internal Medicine | Admitting: Internal Medicine

## 2018-05-13 VITALS — BP 192/72 | HR 71 | Temp 97.7°F

## 2018-05-13 DIAGNOSIS — N183 Chronic kidney disease, stage 3 unspecified: Secondary | ICD-10-CM

## 2018-05-13 LAB — POCT HEMOGLOBIN-HEMACUE: Hemoglobin: 9.9 g/dL — ABNORMAL LOW (ref 12.0–15.0)

## 2018-05-13 MED ORDER — EPOETIN ALFA-EPBX 10000 UNIT/ML IJ SOLN
20000.0000 [IU] | INTRAMUSCULAR | Status: DC
  Administered 2018-05-13: 20000 [IU] via SUBCUTANEOUS
  Filled 2018-05-13: qty 2

## 2018-05-18 ENCOUNTER — Telehealth: Payer: Self-pay

## 2018-05-18 NOTE — Telephone Encounter (Signed)
Called pt to change 4/21 OV to VIDEO visit with Dr. Tamala Julian. Pt agreeable.      Virtual Visit Pre-Appointment Phone Call  Steps For Call:  1. Confirm consent - "In the setting of the current Covid19 crisis, you are scheduled for a VIDEO visit with your provider on 4/21 at 10am.  Just as we do with many in-office visits, in order for you to participate in this visit, we must obtain consent.  If you'd like, I can send this to your mychart (if signed up) or email for you to review.  Otherwise, I can obtain your verbal consent now.  All virtual visits are billed to your insurance company just like a normal visit would be.  By agreeing to a virtual visit, we'd like you to understand that the technology does not allow for your provider to perform an examination, and thus may limit your provider's ability to fully assess your condition.  Finally, though the technology is pretty good, we cannot assure that it will always work on either your or our end, and in the setting of a video visit, we may have to convert it to a phone-only visit.  In either situation, we cannot ensure that we have a secure connection.  Are you willing to proceed?"  2. Give patient instructions for WebEx download to smartphone as below if video visit  3. Advise patient to be prepared with any vital sign or heart rhythm information, their current medicines, and a piece of paper and pen handy for any instructions they may receive the day of their visit  4. Inform patient they will receive a phone call 15 minutes prior to their appointment time (may be from unknown caller ID) so they should be prepared to answer  5. Confirm that appointment type is correct in Epic appointment notes (video vs telephone)    TELEPHONE CALL NOTE  Angelika A Boettner has been deemed a candidate for a follow-up tele-health visit to limit community exposure during the Covid-19 pandemic. I spoke with the patient via phone to ensure availability of phone/video  source, confirm preferred email & phone number, and discuss instructions and expectations.  I reminded Dawanda Mapel Cotugno to be prepared with any vital sign and/or heart rhythm information that could potentially be obtained via home monitoring, at the time of her visit. I reminded SANSKRITI GREENLAW to expect a phone call at the time of her visit if her visit.  Did the patient verbally acknowledge consent to treatment? YES  Wilma Flavin, RN 05/18/2018 9:17 AM.

## 2018-05-25 NOTE — Progress Notes (Signed)
Virtual Visit via Video Note   This visit type was conducted due to national recommendations for restrictions regarding the COVID-19 Pandemic (e.g. social distancing) in an effort to limit this patient's exposure and mitigate transmission in our community.  Due to her co-morbid illnesses, this patient is at least at moderate risk for complications without adequate follow up.  This format is felt to be most appropriate for this patient at this time.  All issues noted in this document were discussed and addressed.  A limited physical exam was performed with this format.  Please refer to the patient's chart for her consent to telehealth for [facility].   Evaluation Performed:  Follow-up visit  Date:  05/26/2018   ID:  Tiffany [name], DOB 11-06-1950, MRN [id number]  Patient Location: Home Provider Location: Office  PCP:  Nolene Ebbs, MD  Cardiologist:  Sinclair Grooms, MD  Electrophysiologist:  None   Chief Complaint:  DIASTOLIC heart failure  History of Present Illness:    Tiffany [name] is a 68 y.o. female with essential hypertension, obesity, OSA, chronic diastolic HF, and Stage 4 CKD.  Recently noted progressive dyspnea. Labs and an  Echocardiogram (note results below) were ordered by Richardson Dopp, PAC. Hemoglobin was 7.9, Creatinine - 3.0, and pro-BNP 750.   She is still having shortness of breath but it is improved.  She still has dyspnea with walking from inside her house to the [place], which is a very short distance.  She denies orthopnea, PND, lower extremity swelling, wheezing, and hemoptysis.  There is no associated chest discomfort.  We discussed the results of her recent work-up.  The work-up demonstrated significant new iron deficiency anemia.  Stage IV chronic kidney disease is relatively stable.  This could also be contributing to dyspnea.  I also discussed with her in layman's terms diastolic heart failure.  She clearly has a component that contributes to  shortness of breath.  The patient does not have symptoms concerning for COVID-19 infection (fever, chills, cough, or new shortness of breath).    Past Medical History:  Diagnosis Date   ALLERGIC RHINITIS    Chronic diastolic heart failure ([place]) 05/05/2013   Echo 03/2018:  EF 27-03, +diastolic dysfunction, GLS -20.5%, normal RVSF, RVSP 42.5 (mild elevated), severe LAE, small pericardial eff, mild MAc, mod AV calc, mild PI   Diabetes mellitus    Hypertension    Hypothyroid    Kidney disease    Obesity    Previous back surgery    x 2   S/P arthroscopy    Sleep apnea    Past Surgical History:  Procedure Laterality Date   BREAST BIOPSY  20+ yrs ago   dont know which breast per pt; benign   BREAST REDUCTION SURGERY  1982   KNEE SURGERY     x 2   LUMBAR DISC SURGERY     x 2   REDUCTION MAMMAPLASTY Bilateral 1985   TOE SURGERY     TOTAL ABDOMINAL HYSTERECTOMY  1997   partial hysterectomy- still has ovaries   TREATMENT FISTULA ANAL       Current Meds  Medication Sig   allopurinol (ZYLOPRIM) 100 MG tablet Take 100 mg by mouth 2 (two) times daily.   amLODipine (NORVASC) 10 MG tablet Take 10 mg by mouth 2 (two) times daily.    aspirin 81 MG tablet Take 81 mg by mouth daily.     B Complex Vitamins (B COMPLEX PO) Take 1 tablet by  mouth daily.   B-D INS SYRINGE 0.5CC/30GX1/2" 30G X 1/2" 0.5 ML MISC TAKE AS DIRECTED TWICE A DAY   BIOTIN 5000 PO Take 1 capsule by mouth daily.    calcitRIOL (ROCALTROL) 0.25 MCG capsule Take 0.25 mcg by mouth daily.   cetirizine (ZYRTEC) 10 MG tablet Take 10 mg by mouth daily as needed for allergies.   cloNIDine (CATAPRES - DOSED IN MG/24 HR) 0.3 mg/24hr Place 1 patch onto the skin once a week. Patient needs to apply a new patch today    diclofenac sodium (VOLTAREN) 1 % GEL APPLY 2 GRAMS TO THE AFFECTED AREA(S) BY TOPICAL ROUTE 4 TIMES PER DAY   doxazosin (CARDURA) 1 MG tablet Take 1 mg by mouth at bedtime.    Fluticasone-Umeclidin-Vilant (TRELEGY ELLIPTA) 100-62.5-25 MCG/INH AEPB Inhale 1 puff into the lungs as needed (SOB).   insulin glargine (LANTUS) 100 UNIT/ML injection Inject 30 Units into the skin at bedtime. As directed   levothyroxine (LEVO-T) 112 MCG tablet Take 112 mcg by mouth daily before breakfast. 3 tablets once a week; 1 tablet daily all other days   Multiple Vitamins-Minerals (VISION-VITE PRESERVE PO) Take 1 tablet by mouth daily.    rosuvastatin (CRESTOR) 10 MG tablet Take 10 mg by mouth daily.     torsemide (DEMADEX) 100 MG tablet Take 100 mg by mouth daily.    zolpidem (AMBIEN) 10 MG tablet TAKE 1 TABLET AT BEDTIME AS NEEDED     Allergies:   Patient has no known allergies.   Social History   Tobacco Use   Smoking status: Never Smoker   Smokeless tobacco: Never Used  Substance Use Topics   Alcohol use: Yes    Alcohol/week: 0.0 standard drinks    Comment: rare   Drug use: No     Family Hx: The patient's family history includes Congestive Heart Failure in her mother; Diabetes in her sister; Other in her sister; Prostate cancer in her father. There is no history of Breast cancer.  ROS:   Please see the history of present illness.    She is unable to lose weight.  She does understand salt and caloric intake reduction is required to decrease her weight and volume.  She has not had neurological complaints.  She is compliant with her medical regimen. All other systems reviewed and are negative.   Prior CV studies:   The following studies were reviewed today:  Echocardiogram 02/2018:  IMPRESSIONS    1. The left ventricle has normal systolic function of 67-59%. The cavity size is normal. There is no increased left ventricular wall thickness. Echo evidence of impaired diastolic relaxation Elevated left ventricular end-diastolic pressure.  2. Global longitudinal strain -20.5% (normal).  3. The right ventricle has normal systolic function. The cavity in normal  in size. There is no increase in right ventricular wall thickness. Right ventricular systolic pressure is mildly elevated with an estimated pressure of 42.5 mmHg.  4. Severely dilated left atrial size.  5. Small pericardial effusion.  6. The pericardial effusion is posterior to the left ventricle.  7. The mitral valve is normal in structure There is mild mitral annular calcification present.  8. The aortic valve is tricuspid. There is moderate thickening and moderate calcification of the aortic valve.  9. The pulmonic valve is normal in structure. Pulmonic valve regurgitation is mild by color flow Doppler. 10. The ascending aorta and aortic root are normal in size and structure. 11. The inferior vena cava was dilated in size with <50%  respiratory variability.  Labs/Other Tests and Data Reviewed:    EKG:  An ECG dated 02/25/2018 was personally reviewed today and demonstrated:  Normal sinus rhythm with normal appearance.  No evidence of LVH or ischemia.Is only positive from a portable minus  Recent Labs: 08/26/2017: TSH 0.058 02/25/2018: Platelets 223 03/05/2018: BUN 79; Creatinine, Ser 2.97; NT-Pro BNP 584; Potassium 4.2; Sodium 142 05/13/2018: Hemoglobin 9.9   Recent Lipid Panel No results found for: CHOL, TRIG, HDL, CHOLHDL, LDLCALC, LDLDIRECT  Wt Readings from Last 3 Encounters:  05/26/18 289 lb (131.1 kg)  03/27/18 289 lb (131.1 kg)  03/19/18 289 lb (131.1 kg)     Objective:    Vital Signs:  BP (!) 167/78    Pulse 76    Ht _0  (1.778 m)    Wt 289 lb (131.1 kg)    BMI 41.47 kg/m    VITAL SIGNS:  reviewed Obese, no distress, no lower extremity edema, no neck vein distention.  ASSESSMENT & PLAN:    1. Chronic diastolic heart failure ([place])   2. Essential hypertension   3. Hyperlipidemia, unspecified hyperlipidemia type   4. CKD (chronic kidney disease), stage III ([place])   5. Obstructive sleep apnea   6. Educated About Covid-19 Virus Infection   7. Other iron deficiency anemia     PLAN:  1. SGLT2 and or GLP 1 agonist therapy should be considered for diabetes control.  This will add a layer of additional protection from cardiac standpoint. 2. Discussed blood pressure target 130/80 mmHg. 3. LDL cholesterol target should be less than 70. 4. This is currently being followed by Dr. Johnney Ou.  One question is whether or not there is thought anemia could be contributed to by decrease erythropoietin production. 5. She is compliant with CPAP. 6. She has good understanding of social distancing. 7. Continue iron injections and consider erythropoietin supplementation.  Overall education and awareness concerning primary/secondary risk prevention was discussed in detail: LDL less than 70, hemoglobin A1c less than 7, blood pressure target less than 130/80 mmHg, >150 minutes of moderate aerobic activity per week, avoidance of smoking, weight control (via diet and exercise), and continued surveillance/management of/for obstructive sleep apnea.   COVID-19 Education: The signs and symptoms of COVID-19 were discussed with the patient and how to seek care for testing (follow up with PCP or arrange E-visit).  The importance of social distancing was discussed today.  Time:   Today, I have spent 15 minutes with the patient with telehealth technology discussing the above problems.     Medication Adjustments/Labs and Tests Ordered: Current medicines are reviewed at length with the patient today.  Concerns regarding medicines are outlined above.   Tests Ordered: No orders of the defined types were placed in this encounter.   Medication Changes: No orders of the defined types were placed in this encounter.   Disposition:  Follow up in 1 year(s)  Signed, Sinclair Grooms, MD  05/26/2018 10:30 AM    [facility]

## 2018-05-26 ENCOUNTER — Encounter: Payer: Self-pay | Admitting: Interventional Cardiology

## 2018-05-26 ENCOUNTER — Other Ambulatory Visit: Payer: Self-pay

## 2018-05-26 ENCOUNTER — Telehealth (INDEPENDENT_AMBULATORY_CARE_PROVIDER_SITE_OTHER): Payer: Medicare Other | Admitting: Interventional Cardiology

## 2018-05-26 VITALS — BP 167/78 | HR 76 | Ht 70.0 in | Wt 289.0 lb

## 2018-05-26 DIAGNOSIS — E785 Hyperlipidemia, unspecified: Secondary | ICD-10-CM

## 2018-05-26 DIAGNOSIS — Z7189 Other specified counseling: Secondary | ICD-10-CM

## 2018-05-26 DIAGNOSIS — D508 Other iron deficiency anemias: Secondary | ICD-10-CM

## 2018-05-26 DIAGNOSIS — N183 Chronic kidney disease, stage 3 unspecified: Secondary | ICD-10-CM

## 2018-05-26 DIAGNOSIS — I5032 Chronic diastolic (congestive) heart failure: Secondary | ICD-10-CM | POA: Diagnosis not present

## 2018-05-26 DIAGNOSIS — I10 Essential (primary) hypertension: Secondary | ICD-10-CM

## 2018-05-26 DIAGNOSIS — G4733 Obstructive sleep apnea (adult) (pediatric): Secondary | ICD-10-CM

## 2018-05-26 NOTE — Patient Instructions (Signed)
Medication Instructions:  Your physician recommends that you continue on your current medications as directed. Please refer to the Current Medication list given to you today.  If you need a refill on your cardiac medications before your next appointment, please call your pharmacy.   Lab work: None If you have labs (blood work) drawn today and your tests are completely normal, you will receive your results only by:  [place] (if you have MyChart) OR  A paper copy in the mail If you have any lab test that is abnormal or we need to change your treatment, we will call you to review the results.  Testing/Procedures: None  Follow-Up: At [facility], you and your health needs are our priority.  As part of our continuing mission to provide you with exceptional heart care, we have created designated Provider Care Teams.  These Care Teams include your primary Cardiologist (physician) and Advanced Practice Providers (APPs -  Physician Assistants and Nurse Practitioners) who all work together to provide you with the care you need, when you need it. You will need a follow up appointment in 12 months.  Please call our office 2 months in advance to schedule this appointment.  You may see Sinclair Grooms, MD or one of the following Advanced Practice Providers on your designated Care Team:   Truitt Merle, NP Cecilie Kicks, NP  Kathyrn Drown, NP  Any Other Special Instructions Will Be Listed Below (If Applicable).   Low-Sodium Eating Plan Sodium, which is an element that makes up salt, helps you maintain a healthy balance of fluids in your body. Too much sodium can increase your blood pressure and cause fluid and waste to be held in your body. Your health care provider or dietitian may recommend following this plan if you have high blood pressure (hypertension), kidney disease, liver disease, or heart failure. Eating less sodium can help lower your blood pressure, reduce swelling, and protect  your heart, liver, and kidneys. What are tips for following this plan? General guidelines  Most people on this plan should limit their sodium intake to 1,500-2,500 mg (milligrams) of sodium each day. Reading food labels   The Nutrition Facts label lists the amount of sodium in one serving of the food. If you eat more than one serving, you must multiply the listed amount of sodium by the number of servings.  Choose foods with less than 140 mg of sodium per serving.  Avoid foods with 300 mg of sodium or more per serving. Shopping  Look for lower-sodium products, often labeled as "low-sodium" or "no salt added."  Always check the sodium content even if foods are labeled as "unsalted" or "no salt added".  Buy fresh foods. ? Avoid canned foods and premade or frozen meals. ? Avoid canned, cured, or processed meats  Buy breads that have less than 80 mg of sodium per slice. Cooking  Eat more home-cooked food and less restaurant, buffet, and fast food.  Avoid adding salt when cooking. Use salt-free seasonings or herbs instead of table salt or sea salt. Check with your health care provider or pharmacist before using salt substitutes.  Cook with plant-based oils, such as canola, sunflower, or olive oil. Meal planning  When eating at a restaurant, ask that your food be prepared with less salt or no salt, if possible.  Avoid foods that contain MSG (monosodium glutamate). MSG is sometimes added to [place] food, bouillon, and some canned foods. What foods are recommended? The items listed may  not be a complete list. Talk with your dietitian about what dietary choices are best for you. Grains Low-sodium cereals, including oats, puffed wheat and rice, and shredded wheat. Low-sodium crackers. Unsalted rice. Unsalted pasta. Low-sodium bread. Whole-grain breads and whole-grain pasta. Vegetables Fresh or frozen vegetables. "No salt added" canned vegetables. "No salt added" tomato sauce and paste.  Low-sodium or reduced-sodium tomato and vegetable juice. Fruits Fresh, frozen, or canned fruit. Fruit juice. Meats and other protein foods Fresh or frozen (no salt added) meat, poultry, seafood, and fish. Low-sodium canned tuna and salmon. Unsalted nuts. Dried peas, beans, and lentils without added salt. Unsalted canned beans. Eggs. Unsalted nut butters. Dairy Milk. Soy milk. Cheese that is naturally low in sodium, such as ricotta cheese, fresh mozzarella, or Swiss cheese Low-sodium or reduced-sodium cheese. Cream cheese. Yogurt. Fats and oils Unsalted butter. Unsalted margarine with no trans fat. Vegetable oils such as canola or olive oils. Seasonings and other foods Fresh and dried herbs and spices. Salt-free seasonings. Low-sodium mustard and ketchup. Sodium-free salad dressing. Sodium-free light mayonnaise. Fresh or refrigerated horseradish. Lemon juice. Vinegar. Homemade, reduced-sodium, or low-sodium soups. Unsalted popcorn and pretzels. Low-salt or salt-free chips. What foods are not recommended? The items listed may not be a complete list. Talk with your dietitian about what dietary choices are best for you. Grains Instant hot cereals. Bread stuffing, pancake, and biscuit mixes. Croutons. Seasoned rice or pasta mixes. Noodle soup cups. Boxed or frozen macaroni and cheese. Regular salted crackers. Self-rising flour. Vegetables Sauerkraut, pickled vegetables, and relishes. Olives. [place] fries. Onion rings. Regular canned vegetables (not low-sodium or reduced-sodium). Regular canned tomato sauce and paste (not low-sodium or reduced-sodium). Regular tomato and vegetable juice (not low-sodium or reduced-sodium). Frozen vegetables in sauces. Meats and other protein foods Meat or fish that is salted, canned, smoked, spiced, or pickled. Bacon, ham, sausage, hotdogs, corned beef, chipped beef, packaged lunch meats, salt pork, jerky, pickled herring, anchovies, regular canned tuna, sardines, salted  nuts. Dairy Processed cheese and cheese spreads. Cheese curds. Blue cheese. Feta cheese. String cheese. Regular cottage cheese. Buttermilk. Canned milk. Fats and oils Salted butter. Regular margarine. Ghee. Bacon fat. Seasonings and other foods Onion salt, garlic salt, seasoned salt, table salt, and sea salt. Canned and packaged gravies. Worcestershire sauce. Tartar sauce. Barbecue sauce. Teriyaki sauce. Soy sauce, including reduced-sodium. Steak sauce. Fish sauce. Oyster sauce. Cocktail sauce. Horseradish that you find on the shelf. Regular ketchup and mustard. Meat flavorings and tenderizers. Bouillon cubes. Hot sauce and Tabasco sauce. Premade or packaged marinades. Premade or packaged taco seasonings. Relishes. Regular salad dressings. Salsa. Potato and tortilla chips. Corn chips and puffs. Salted popcorn and pretzels. Canned or dried soups. Pizza. Frozen entrees and pot pies. Summary  Eating less sodium can help lower your blood pressure, reduce swelling, and protect your heart, liver, and kidneys.  Most people on this plan should limit their sodium intake to 1,500-2,000 mg (milligrams) of sodium each day.  Canned, boxed, and frozen foods are high in sodium. Restaurant foods, fast foods, and pizza are also very high in sodium. You also get sodium by adding salt to food.  Try to cook at home, eat more fresh fruits and vegetables, and eat less fast food, canned, processed, or prepared foods. This information is not intended to replace advice given to you by your health care provider. Make sure you discuss any questions you have with your health care provider. Document Released: 07/13/2001 Document Revised: 01/15/2016 Document Reviewed: 01/15/2016 Elsevier Interactive Patient Education  2019 [employer].

## 2018-05-27 ENCOUNTER — Other Ambulatory Visit: Payer: Self-pay

## 2018-05-27 ENCOUNTER — Ambulatory Visit (HOSPITAL_COMMUNITY)
Admission: RE | Admit: 2018-05-27 | Discharge: 2018-05-27 | Disposition: A | Discharge status: Home / Self Care | Payer: Medicare Other | Source: Ambulatory Visit | Attending: Internal Medicine | Admitting: Internal Medicine

## 2018-05-27 VITALS — BP 165/79 | HR 72 | Temp 98.0°F | Resp 18

## 2018-05-27 DIAGNOSIS — N184 Chronic kidney disease, stage 4 (severe): Secondary | ICD-10-CM | POA: Diagnosis not present

## 2018-05-27 DIAGNOSIS — D631 Anemia in chronic kidney disease: Secondary | ICD-10-CM | POA: Insufficient documentation

## 2018-05-27 DIAGNOSIS — N183 Chronic kidney disease, stage 3 unspecified: Secondary | ICD-10-CM

## 2018-05-27 LAB — IRON AND TIBC
Iron: 61 ug/dL (ref 28–170)
Saturation Ratios: 22 % (ref 10.4–31.8)
TIBC: 273 ug/dL (ref 250–450)
UIBC: 212 ug/dL

## 2018-05-27 LAB — FERRITIN: Ferritin: 507 ng/mL — ABNORMAL HIGH (ref 11–307)

## 2018-05-27 LAB — POCT HEMOGLOBIN-HEMACUE: Hemoglobin: 10.4 g/dL — ABNORMAL LOW (ref 12.0–15.0)

## 2018-05-27 MED ORDER — EPOETIN ALFA-EPBX 10000 UNIT/ML IJ SOLN
20000.0000 [IU] | INTRAMUSCULAR | Status: DC
  Administered 2018-05-27: 20000 [IU] via SUBCUTANEOUS
  Filled 2018-05-27: qty 2

## 2018-06-10 ENCOUNTER — Other Ambulatory Visit: Payer: Self-pay

## 2018-06-10 ENCOUNTER — Ambulatory Visit (HOSPITAL_COMMUNITY)
Admission: RE | Admit: 2018-06-10 | Discharge: 2018-06-10 | Disposition: A | Discharge status: Home / Self Care | Payer: Medicare Other | Source: Ambulatory Visit | Attending: Internal Medicine | Admitting: Internal Medicine

## 2018-06-10 VITALS — BP 160/80 | HR 71 | Temp 97.7°F

## 2018-06-10 DIAGNOSIS — N183 Chronic kidney disease, stage 3 unspecified: Secondary | ICD-10-CM

## 2018-06-10 LAB — POCT HEMOGLOBIN-HEMACUE: Hemoglobin: 10.2 g/dL — ABNORMAL LOW (ref 12.0–15.0)

## 2018-06-10 MED ORDER — EPOETIN ALFA-EPBX 10000 UNIT/ML IJ SOLN
20000.0000 [IU] | INTRAMUSCULAR | Status: DC
  Administered 2018-06-10: 20000 [IU] via SUBCUTANEOUS
  Filled 2018-06-10: qty 2

## 2018-06-15 ENCOUNTER — Ambulatory Visit: Payer: Medicare Other | Admitting: Internal Medicine

## 2018-06-15 ENCOUNTER — Other Ambulatory Visit: Payer: Self-pay

## 2018-06-15 ENCOUNTER — Encounter: Payer: Self-pay | Admitting: Nurse Practitioner

## 2018-06-15 ENCOUNTER — Ambulatory Visit (INDEPENDENT_AMBULATORY_CARE_PROVIDER_SITE_OTHER): Payer: Medicare Other | Admitting: Nurse Practitioner

## 2018-06-15 DIAGNOSIS — G4733 Obstructive sleep apnea (adult) (pediatric): Secondary | ICD-10-CM

## 2018-06-15 NOTE — Assessment & Plan Note (Signed)
Patient has had a stable interval.  She is doing well with a set pressure of 9 cm H2O on CPAP.  She is compliant with her CPAP and wears it nightly.  Patient Instructions  Patient continues to benefit from CPAP with good compliance and control documented Continue CPAP at current settings Continue current medications Goal of 4 hours or more usage per night Maintain healthy weight Do not drive if drowsy  Follow up: Follow up with Dr. Annamaria Boots in 1 year or sooner if needed

## 2018-06-15 NOTE — Progress Notes (Signed)
Virtual Visit via Telephone Note  I connected with Tiffany [name] on 06/15/18 at  9:30 AM EDT by telephone and verified that I am speaking with the correct person using two identifiers.  Location: Patient: home Provider: office   I discussed the limitations, risks, security and privacy concerns of performing an evaluation and management service by telephone and the availability of in person appointments. I also discussed with the patient that there may be a patient responsible charge related to this service. The patient expressed understanding and agreed to proceed.   History of Present Illness: 68 year old female never smoker with OSA, rhinitis, and obesity who is followed by Dr. Annamaria Boots. PMH includes hypertension, hypothyroid, DM  Patient has a tele-visit today for a follow-up appointment.  She states that this is been a stable interval for her.  She has been doing well with CPAP and wears it nightly.  Denies any issues with mask.  Her pressure was recently to change a set pressure of 9 cmH20.  Patient states that she has tolerated this well.  She states that she does benefit from use of CPAP and feels much less drowsy throughout the day. Denies f/c/s, n/v/d, hemoptysis, PND, leg swelling.    Observations/Objective:  CPAP compliance report 05/13/18 - 06/11/18: Usage days 29/30 (97%), average usage 7 hours 9 minutes, CPAP set pressure 9 cmH20, AHI: 0.5  Assessment and Plan: Patient has had a stable interval.  She is doing well with a set pressure of 9 cm H2O on CPAP.  She is compliant with her CPAP and wears it nightly.  Patient Instructions  Patient continues to benefit from CPAP with good compliance and control documented Continue CPAP at current settings Continue current medications Goal of 4 hours or more usage per night Maintain healthy weight Do not drive if drowsy    Follow Up Instructions:  Follow up with Dr. Annamaria Boots in 1 year or sooner if needed    I discussed the  assessment and treatment plan with the patient. The patient was provided an opportunity to ask questions and all were answered. The patient agreed with the plan and demonstrated an understanding of the instructions.   The patient was advised to call back or seek an in-person evaluation if the symptoms worsen or if the condition fails to improve as anticipated.  I provided 23 minutes of non-face-to-face time during this encounter.   Fenton Foy, NP

## 2018-06-15 NOTE — Patient Instructions (Signed)
Patient continues to benefit from CPAP with good compliance and control documented Continue CPAP at current settings Continue current medications Goal of 4 hours or more usage per night Maintain healthy weight Do not drive if drowsy  Follow up: Follow up with Dr. Annamaria Boots in 1 year or sooner if needed

## 2018-06-24 ENCOUNTER — Ambulatory Visit (HOSPITAL_COMMUNITY)
Admission: RE | Admit: 2018-06-24 | Discharge: 2018-06-24 | Disposition: A | Discharge status: Home / Self Care | Payer: Medicare Other | Source: Ambulatory Visit | Attending: Internal Medicine | Admitting: Internal Medicine

## 2018-06-24 ENCOUNTER — Other Ambulatory Visit: Payer: Self-pay

## 2018-06-24 VITALS — BP 175/77 | HR 74 | Temp 97.8°F | Resp 20

## 2018-06-24 DIAGNOSIS — N183 Chronic kidney disease, stage 3 unspecified: Secondary | ICD-10-CM

## 2018-06-24 DIAGNOSIS — D631 Anemia in chronic kidney disease: Secondary | ICD-10-CM | POA: Insufficient documentation

## 2018-06-24 LAB — IRON AND TIBC
Iron: 75 ug/dL (ref 28–170)
Saturation Ratios: 27 % (ref 10.4–31.8)
TIBC: 280 ug/dL (ref 250–450)
UIBC: 205 ug/dL

## 2018-06-24 LAB — FERRITIN: Ferritin: 673 ng/mL — ABNORMAL HIGH (ref 11–307)

## 2018-06-24 LAB — POCT HEMOGLOBIN-HEMACUE: Hemoglobin: 10.7 g/dL — ABNORMAL LOW (ref 12.0–15.0)

## 2018-06-24 MED ORDER — EPOETIN ALFA-EPBX 10000 UNIT/ML IJ SOLN
20000.0000 [IU] | INTRAMUSCULAR | Status: DC
  Administered 2018-06-24: 20000 [IU] via SUBCUTANEOUS
  Filled 2018-06-24: qty 2

## 2018-06-30 ENCOUNTER — Other Ambulatory Visit: Payer: Self-pay | Admitting: Internal Medicine

## 2018-06-30 NOTE — Telephone Encounter (Signed)
Is this appropriate for refill ? Last fill date 05/17/2018

## 2018-07-01 NOTE — Telephone Encounter (Signed)
Kenney Houseman, would you mind advising if okay to fill patient's Ambien?  You did a telephone visit with patient on 5.11.2020  Thank you so much!

## 2018-07-01 NOTE — Telephone Encounter (Signed)
According to PDMP review patient was prescribed this medication on 05/17/18 with 2 refills. It is not time for refill.

## 2018-07-08 ENCOUNTER — Other Ambulatory Visit: Payer: Self-pay

## 2018-07-08 ENCOUNTER — Ambulatory Visit (HOSPITAL_COMMUNITY)
Admission: RE | Admit: 2018-07-08 | Discharge: 2018-07-08 | Disposition: A | Discharge status: Home / Self Care | Payer: Medicare Other | Source: Ambulatory Visit | Attending: Internal Medicine | Admitting: Internal Medicine

## 2018-07-08 VITALS — BP 169/85 | HR 81 | Temp 98.1°F | Resp 20

## 2018-07-08 DIAGNOSIS — N183 Chronic kidney disease, stage 3 unspecified: Secondary | ICD-10-CM

## 2018-07-08 LAB — RENAL FUNCTION PANEL
Albumin: 3.4 g/dL — ABNORMAL LOW (ref 3.5–5.0)
Anion gap: 14 (ref 5–15)
BUN: 98 mg/dL — ABNORMAL HIGH (ref 8–23)
CO2: 22 mmol/L (ref 22–32)
Calcium: 9.3 mg/dL (ref 8.9–10.3)
Chloride: 106 mmol/L (ref 98–111)
Creatinine, Ser: 2.82 mg/dL — ABNORMAL HIGH (ref 0.44–1.00)
GFR calc Af Amer: 19 mL/min — ABNORMAL LOW (ref >60)
GFR calc non Af Amer: 17 mL/min — ABNORMAL LOW (ref >60)
Glucose, Bld: 125 mg/dL — ABNORMAL HIGH (ref 70–99)
Phosphorus: 4.8 mg/dL — ABNORMAL HIGH (ref 2.5–4.6)
Potassium: 4.3 mmol/L (ref 3.5–5.1)
Sodium: 142 mmol/L (ref 135–145)

## 2018-07-08 LAB — POCT HEMOGLOBIN-HEMACUE: Hemoglobin: 10 g/dL — ABNORMAL LOW (ref 12.0–15.0)

## 2018-07-08 MED ORDER — EPOETIN ALFA-EPBX 10000 UNIT/ML IJ SOLN
20000.0000 [IU] | INTRAMUSCULAR | Status: DC
  Administered 2018-07-08: 20000 [IU] via SUBCUTANEOUS
  Filled 2018-07-08: qty 2

## 2018-07-09 LAB — PTH, INTACT AND CALCIUM
Calcium, Total (PTH): 9 mg/dL (ref 8.7–10.3)
PTH: 187 pg/mL — ABNORMAL HIGH (ref 15–65)

## 2018-07-13 ENCOUNTER — Other Ambulatory Visit: Payer: Self-pay | Admitting: Internal Medicine

## 2018-07-13 DIAGNOSIS — Z1231 Encounter for screening mammogram for malignant neoplasm of breast: Secondary | ICD-10-CM

## 2018-07-22 ENCOUNTER — Ambulatory Visit (HOSPITAL_COMMUNITY)
Admission: RE | Admit: 2018-07-22 | Discharge: 2018-07-22 | Disposition: A | Discharge status: Home / Self Care | Payer: Medicare Other | Source: Ambulatory Visit | Attending: Internal Medicine | Admitting: Internal Medicine

## 2018-07-22 ENCOUNTER — Other Ambulatory Visit: Payer: Self-pay

## 2018-07-22 VITALS — BP 145/69 | HR 72 | Temp 97.3°F | Resp 20

## 2018-07-22 DIAGNOSIS — D631 Anemia in chronic kidney disease: Secondary | ICD-10-CM | POA: Insufficient documentation

## 2018-07-22 DIAGNOSIS — N183 Chronic kidney disease, stage 3 unspecified: Secondary | ICD-10-CM

## 2018-07-22 LAB — IRON AND TIBC
Iron: 66 ug/dL (ref 28–170)
Saturation Ratios: 23 % (ref 10.4–31.8)
TIBC: 286 ug/dL (ref 250–450)
UIBC: 220 ug/dL

## 2018-07-22 LAB — POCT HEMOGLOBIN-HEMACUE: Hemoglobin: 10.5 g/dL — ABNORMAL LOW (ref 12.0–15.0)

## 2018-07-22 LAB — FERRITIN: Ferritin: 613 ng/mL — ABNORMAL HIGH (ref 11–307)

## 2018-07-22 MED ORDER — EPOETIN ALFA-EPBX 10000 UNIT/ML IJ SOLN
20000.0000 [IU] | INTRAMUSCULAR | Status: DC
  Administered 2018-07-22: 20000 [IU] via SUBCUTANEOUS
  Filled 2018-07-22: qty 2

## 2018-07-27 ENCOUNTER — Other Ambulatory Visit: Payer: Self-pay | Admitting: Internal Medicine

## 2018-07-27 NOTE — Telephone Encounter (Signed)
Ambien refill e-sent

## 2018-07-27 NOTE — Telephone Encounter (Signed)
06/15/18  Assessment and Plan: Patient has had a stable interval.  She is doing well with a set pressure of 9 cm H2O on CPAP.  She is compliant with her CPAP and wears it nightly.  Patient Instructions  Patient continues to benefit from CPAP with good compliance and control documented Continue CPAP at current settings Continue current medications Goal of 4 hours or more usage per night Maintain healthy weight Do not drive if drowsy    Follow Up Instructions:  Follow up with Dr. Annamaria Boots in 1 year or sooner if needed

## 2018-07-28 ENCOUNTER — Telehealth: Payer: Self-pay | Admitting: Physical Medicine and Rehabilitation

## 2018-07-30 NOTE — Telephone Encounter (Signed)
Is auth needed for bilateral [phone]? Scheduled for 7/9.

## 2018-07-30 NOTE — Telephone Encounter (Signed)
Left message #1

## 2018-07-30 NOTE — Telephone Encounter (Signed)
ok 

## 2018-08-04 NOTE — Telephone Encounter (Signed)
Notification or Prior Authorization is not required for the requested services  This [employer]vant[employer] members plan does not currently require a prior authorization for [id number]  Decision ID #:[id number]

## 2018-08-05 ENCOUNTER — Ambulatory Visit (HOSPITAL_COMMUNITY)
Admission: EM | Admit: 2018-08-05 | Discharge: 2018-08-05 | Disposition: A | Discharge status: Home / Self Care | Payer: Medicare Other | Attending: Emergency Medicine | Admitting: Emergency Medicine

## 2018-08-05 ENCOUNTER — Ambulatory Visit (INDEPENDENT_AMBULATORY_CARE_PROVIDER_SITE_OTHER): Payer: Medicare Other

## 2018-08-05 ENCOUNTER — Encounter (HOSPITAL_COMMUNITY): Payer: Self-pay

## 2018-08-05 ENCOUNTER — Ambulatory Visit (HOSPITAL_COMMUNITY)
Admission: RE | Admit: 2018-08-05 | Discharge: 2018-08-05 | Disposition: A | Discharge status: Home / Self Care | Payer: Medicare Other | Source: Ambulatory Visit | Attending: Internal Medicine | Admitting: Internal Medicine

## 2018-08-05 ENCOUNTER — Other Ambulatory Visit: Payer: Self-pay

## 2018-08-05 VITALS — BP 161/74 | HR 77 | Temp 97.2°F | Resp 20

## 2018-08-05 DIAGNOSIS — N183 Chronic kidney disease, stage 3 unspecified: Secondary | ICD-10-CM

## 2018-08-05 DIAGNOSIS — M25511 Pain in right shoulder: Secondary | ICD-10-CM | POA: Diagnosis not present

## 2018-08-05 MED ORDER — EPOETIN ALFA-EPBX 10000 UNIT/ML IJ SOLN
20000.0000 [IU] | INTRAMUSCULAR | Status: DC
  Administered 2018-08-05: 20000 [IU] via SUBCUTANEOUS
  Filled 2018-08-05: qty 2

## 2018-08-05 MED ORDER — PREDNISONE 10 MG PO TABS: 30.0000 mg | ORAL_TABLET | Freq: Every day | ORAL | 0 refills | Status: AC

## 2018-08-05 NOTE — Discharge Instructions (Addendum)
Xray showed some mild arthritis in [facility] joint Continue to work on rage of motion and strength in arm May take prednisone 30 mg daily for 5 days  Follow up with ortho for persistent issues

## 2018-08-05 NOTE — ED Provider Notes (Signed)
Leslie    CSN: [id number] Arrival date & time: 08/05/18  6578      History   Chief Complaint Chief Complaint  Patient presents with  . Fall    HPI JANVI AMMAR is a 68 y.o. female history of hyperlipidemia, DM type II, CKD, hypertension, presenting today for evaluation of question of right shoulder pain.  Patient states that she fell on her shoulder approximately 2 months ago.  After the fall she noted bruising in her arm.  This bruising has resolved.  She has some persistent pain anteriorly and posteriorly in her shoulder area.  She denies difficulty moving her shoulder.  She has been using Tylenol.  Denies previous injury to shoulder prior to this incident.  HPI  Past Medical History:  Diagnosis Date  . ALLERGIC RHINITIS   . Chronic diastolic heart failure ([place]) 05/05/2013   Echo 03/2018:  EF 46-96, +diastolic dysfunction, GLS -20.5%, normal RVSF, RVSP 42.5 (mild elevated), severe LAE, small pericardial eff, mild MAc, mod AV calc, mild PI  . Diabetes mellitus   . Hypertension   . Hypothyroid   . Kidney disease   . Obesity   . Previous back surgery    x 2  . S/P arthroscopy   . Sleep apnea     Patient Active Problem List   Diagnosis Date Noted  . Hyperlipidemia 09/12/2016  . Type 2 diabetes mellitus without complications ([place]) 29/52/8413  . Tachycardia 08/26/2016  . Palpitation 08/26/2016  . SOB (shortness of breath) 08/26/2016  . CKD (chronic kidney disease), stage III ([place]) 04/28/2014  . Chronic diastolic heart failure ([place]) 05/05/2013  . Essential hypertension 05/05/2013  . HEART MURMUR, SYSTOLIC 24/40/1027  . UNSPECIFIED HYPOTHYROIDISM 02/12/2007  . OBESITY 01/08/2007  . Obstructive sleep apnea 01/08/2007  . Seasonal and perennial allergic rhinitis 01/08/2007    Past Surgical History:  Procedure Laterality Date  . BREAST BIOPSY  20+ yrs ago   dont know which breast per pt; benign  . BREAST REDUCTION SURGERY  1982  . KNEE SURGERY     x 2   . LUMBAR DISC SURGERY     x 2  . REDUCTION MAMMAPLASTY Bilateral 1985  . TOE SURGERY    . TOTAL ABDOMINAL HYSTERECTOMY  1997   partial hysterectomy- still has ovaries  . TREATMENT FISTULA ANAL      OB History   No obstetric history on file.      Home Medications    Prior to Admission medications   Medication Sig Start Date End Date Taking? Authorizing Provider  allopurinol (ZYLOPRIM) 100 MG tablet Take 100 mg by mouth 2 (two) times daily. 07/30/16   [provider]  amLODipine (NORVASC) 10 MG tablet Take 10 mg by mouth 2 (two) times daily.     [provider]  aspirin 81 MG tablet Take 81 mg by mouth daily.      [provider]  B Complex Vitamins (B COMPLEX PO) Take 1 tablet by mouth daily.    [provider]  B-D INS SYRINGE 0.5CC/30GX1/2" 30G X 1/2" 0.5 ML MISC TAKE AS DIRECTED TWICE A DAY 08/12/15   [provider]  BIOTIN 5000 PO Take 1 capsule by mouth daily.     [provider]  calcitRIOL (ROCALTROL) 0.25 MCG capsule Take 0.25 mcg by mouth daily.    [provider]  cetirizine (ZYRTEC) 10 MG tablet Take 10 mg by mouth daily as needed for allergies.    [provider]  cloNIDine (CATAPRES - DOSED IN MG/24 HR) 0.3 mg/24hr Place 1 patch onto the skin once a week. Patient needs to apply a new patch today     [provider]  diclofenac sodium (VOLTAREN) 1 % GEL APPLY 2 GRAMS TO THE AFFECTED AREA(S) BY TOPICAL ROUTE 4 TIMES PER DAY 02/12/18   [provider]  doxazosin (CARDURA) 1 MG tablet Take 1 mg by mouth at bedtime. 02/05/18   [provider]  Fluticasone-Umeclidin-Vilant (TRELEGY ELLIPTA) 100-62.5-25 MCG/INH AEPB Inhale 1 puff into the lungs as needed (SOB).    [provider]  insulin glargine (LANTUS) 100 UNIT/ML injection Inject 30 Units into the skin at bedtime. As directed    [provider]  levothyroxine (LEVO-T) 112 MCG tablet Take 112 mcg by mouth daily  before breakfast. 3 tablets once a week; 1 tablet daily all other days    [provider]  Multiple Vitamins-Minerals (VISION-VITE PRESERVE PO) Take 1 tablet by mouth daily.     [provider]  predniSONE (DELTASONE) 10 MG tablet Take 3 tablets (30 mg total) by mouth daily with breakfast for 5 days. 08/05/18 08/10/18  Dian Laprade C, PA-C  rosuvastatin (CRESTOR) 10 MG tablet Take 10 mg by mouth daily.      [provider]  torsemide (DEMADEX) 100 MG tablet Take 100 mg by mouth daily.     [provider]  zolpidem (AMBIEN) 10 MG tablet TAKE 1 TABLET AT BEDTIME AS NEEDED FOR SLEEP 07/27/18   Deneise Lever, MD    Family History Family History  Problem Relation Age of Onset  . Prostate cancer Father   . Congestive Heart Failure Mother   . Diabetes Sister   . Other Sister        septis  . Breast cancer Neg Hx     Social History Social History   Tobacco Use  . Smoking status: Never Smoker  . Smokeless tobacco: Never Used  Substance Use Topics  . Alcohol use: Yes    Alcohol/week: 0.0 standard drinks    Comment: rare  . Drug use: No     Allergies   Patient has no known allergies.   Review of Systems Review of Systems  Constitutional: Negative for fatigue and fever.  Eyes: Negative for visual disturbance.  Respiratory: Negative for shortness of breath.   Cardiovascular: Negative for chest pain.  Gastrointestinal: Negative for abdominal pain, nausea and vomiting.  Musculoskeletal: Positive for arthralgias and myalgias. Negative for joint swelling.  Skin: Negative for color change, rash and wound.  Neurological: Negative for dizziness, weakness, light-headedness and headaches.     Physical Exam Triage Vital Signs ED Triage Vitals  Enc Vitals Group     BP 08/05/18 0932 (!) 185/76     Pulse --      Resp 08/05/18 0932 18     Temp 08/05/18 0932 97.8 F (36.6 C)     Temp Source 08/05/18 0932 Oral     SpO2 08/05/18 0932 100 %      Weight 08/05/18 0930 274 lb (124.3 kg)     Height --      Head Circumference --      Peak Flow --      Pain Score 08/05/18 0930 5     Pain Loc --      Pain Edu? --      Excl. in [place]? --    No data found.  Updated Vital Signs BP (!) 185/76 (BP  Location: Right Arm) Comment: pt has taken her b/p med today.  pt states she wears a b/p patch.  Temp 97.8 F (36.6 C) (Oral)   Resp 18   Wt 274 lb (124.3 kg)   SpO2 100%   BMI 39.31 kg/m   Visual Acuity Right Eye Distance:   Left Eye Distance:   Bilateral Distance:    Right Eye Near:   Left Eye Near:    Bilateral Near:     Physical Exam Vitals signs and nursing note reviewed.  Constitutional:      General: She is not in acute distress.    Appearance: She is well-developed.  HENT:     Head: Normocephalic and atraumatic.  Eyes:     Conjunctiva/sclera: Conjunctivae normal.  Neck:     Musculoskeletal: Neck supple.  Cardiovascular:     Rate and Rhythm: Normal rate and regular rhythm.     Heart sounds: No murmur.  Pulmonary:     Effort: Pulmonary effort is normal. No respiratory distress.     Breath sounds: Normal breath sounds.  Abdominal:     Palpations: Abdomen is soft.     Tenderness: There is no abdominal tenderness.  Musculoskeletal:     Comments: Right shoulder no obvious deformity or swelling, no overlying bruising or erythema, nontender to palpation to length of clavicle, AC joint, mild tenderness above scapular spine and into trapezius musculature, tenderness over insertion of biceps tendon  Full active range of motion of elbow  Strength 5/5 and equal bilaterally at shoulder Negative resisted external rotation, negative liftoff  Skin:    General: Skin is warm and dry.  Neurological:     Mental Status: She is alert.      UC Treatments / Results  Labs (all labs ordered are listed, but only abnormal results are displayed) Labs Reviewed - No data to display  EKG   Radiology Dg Shoulder Right  Result  Date: 08/05/2018 CLINICAL DATA:  Fall 2 months ago with continued shoulder pain EXAM: RIGHT SHOULDER - 2+ VIEW COMPARISON:  03/12/2018 FINDINGS: Degenerative spurring, mainly upward pointing, at the acromioclavicular joint without offset. No fracture deformity or glenohumeral malalignment. Cardiomegaly. IMPRESSION: 1. No acute finding. 2. Degenerative spurring at the acromioclavicular joint. Electronically Signed   By: Monte Fantasia M.D.   On: 08/05/2018 10:26    Procedures Procedures (including critical care time)  Medications Ordered in UC Medications - No data to display  Initial Impression / Assessment and Plan / UC Course  I have reviewed the triage vital signs and the nursing notes.  Pertinent labs & imaging results that were available during my care of the patient were reviewed by me and considered in my medical decision making (see chart for details).     X-ray showing mild arthritis and AC joint.  No acute bony abnormality.  Full active range of motion, strength intact, do not suspect underlying rotator cuff tear.  Given appearance of pictures and bruising after initial accident, concerning for possible biceps tendon rupture.  Patient has full range of motion of biceps, no Popeye deformity.  Recommended continuing Tylenol, following up with orthopedics if having persistent issues for further evaluation and imaging.Discussed strict return precautions. Patient verbalized understanding and is agreeable with plan.  Final Clinical Impressions(s) / UC Diagnoses   Final diagnoses:  Acute pain of right shoulder     Discharge Instructions     Xray showed some mild arthritis in [facility] joint Continue to work on rage of motion and  strength in arm May take prednisone 30 mg daily for 5 days  Follow up with ortho for persistent issues   ED Prescriptions    Medication Sig Dispense Auth. Provider   predniSONE (DELTASONE) 10 MG tablet Take 3 tablets (30 mg total) by mouth daily with breakfast  for 5 days. 15 tablet Klinton Candelas, [place] C, PA-C     Controlled Substance Prescriptions [place] Controlled Substance Registry consulted? Not Applicable   Janith Lima, [place] 08/05/18 1102

## 2018-08-05 NOTE — ED Triage Notes (Signed)
Pt states she fell 3 weeks ago. Pt states she's having pain in her right shoulder.

## 2018-08-06 LAB — POCT HEMOGLOBIN-HEMACUE: Hemoglobin: 10.3 g/dL — ABNORMAL LOW (ref 12.0–15.0)

## 2018-08-13 ENCOUNTER — Other Ambulatory Visit: Payer: Self-pay

## 2018-08-13 ENCOUNTER — Encounter: Payer: Self-pay | Admitting: Physical Medicine and Rehabilitation

## 2018-08-13 ENCOUNTER — Ambulatory Visit (INDEPENDENT_AMBULATORY_CARE_PROVIDER_SITE_OTHER): Payer: Medicare Other | Admitting: Physical Medicine and Rehabilitation

## 2018-08-13 ENCOUNTER — Ambulatory Visit: Payer: Self-pay

## 2018-08-13 VITALS — BP 156/73 | HR 73

## 2018-08-13 DIAGNOSIS — N183 Chronic kidney disease, stage 3 unspecified: Secondary | ICD-10-CM

## 2018-08-13 DIAGNOSIS — G8929 Other chronic pain: Secondary | ICD-10-CM

## 2018-08-13 DIAGNOSIS — E119 Type 2 diabetes mellitus without complications: Secondary | ICD-10-CM

## 2018-08-13 DIAGNOSIS — M47816 Spondylosis without myelopathy or radiculopathy, lumbar region: Secondary | ICD-10-CM | POA: Diagnosis not present

## 2018-08-13 DIAGNOSIS — M545 Low back pain, unspecified: Secondary | ICD-10-CM

## 2018-08-13 DIAGNOSIS — Z794 Long term (current) use of insulin: Secondary | ICD-10-CM

## 2018-08-13 MED ORDER — METHYLPREDNISOLONE ACETATE 80 MG/ML IJ SUSP
80.0000 mg | Freq: Once | INTRAMUSCULAR | Status: AC
  Administered 2018-08-13: 80 mg

## 2018-08-13 NOTE — Progress Notes (Signed)
Numeric Pain Rating Scale and Functional Assessment Average Pain 7   In the last MONTH (on 0-10 scale) has pain interfered with the following?  1. General activity like being  able to carry out your everyday physical activities such as walking, climbing stairs, carrying groceries, or moving a chair?  Rating(7)     +Driver, -BT (asprin), -Dye Allergies.

## 2018-08-17 ENCOUNTER — Encounter: Payer: Self-pay | Admitting: Physical Medicine and Rehabilitation

## 2018-08-17 NOTE — Procedures (Signed)
Lumbar Facet Joint Intra-Articular Injection(s) with Fluoroscopic Guidance  Patient: Tiffany [name]      Date of Birth: [birth year] MRN: [id number] PCP: Nolene Ebbs, MD      Visit Date: 08/13/2018   Universal Protocol:    Date/Time: 08/13/2018  Consent Given By: the patient  Position: PRONE   Additional Comments: Vital signs were monitored before and after the procedure. Patient was prepped and draped in the usual sterile fashion. The correct patient, procedure, and site was verified.   Injection Procedure Details:  Procedure Site One Meds Administered:  Meds ordered this encounter  Medications  . methylPREDNISolone acetate (DEPO-MEDROL) injection 80 mg     Laterality: Bilateral  Location/Site:  L2-L3 L4-L5  Needle size: 22 guage  Needle type: Spinal  Needle Placement: Articular  Findings:  -Comments: Excellent flow of contrast producing a partial arthrogram.  Procedure Details: The fluoroscope beam is vertically oriented in AP, and the inferior recess is visualized beneath the lower pole of the inferior apophyseal process, which represents the target point for needle insertion. When direct visualization is difficult the target point is located at the medial projection of the vertebral pedicle. The region overlying each aforementioned target is locally anesthetized with a 1 to 2 ml. volume of 1% Lidocaine without Epinephrine.   The spinal needle was inserted into each of the above mentioned facet joints using biplanar fluoroscopic guidance. A 0.25 to 0.5 ml. volume of Isovue-250 was injected and a partial facet joint arthrogram was obtained. A single spot film was obtained of the resulting arthrogram.    One to 1.25 ml of the steroid/anesthetic solution was then injected into each of the facet joints noted above.   Additional Comments:  The patient tolerated the procedure well Dressing: 2 x 2 sterile gauze and Band-Aid    Post-procedure details: Patient was  observed during the procedure. Post-procedure instructions were reviewed.  Patient left the clinic in stable condition.

## 2018-08-17 NOTE — Progress Notes (Signed)
Tiffany [name] - 68 y.o. female MRN [id number]  Date of birth: 01/10/1951  Office Visit Note: Visit Date: 08/13/2018 PCP: Nolene Ebbs, MD Referred by: Nolene Ebbs, MD  Subjective: Chief Complaint  Patient presents with   Lower Back - Pain   HPI: Tiffany [name] is a 68 y.o. female who comes in today For evaluation management of chronic worsening mostly axial low back pain some referral in the hips.  She reports average pain is 7 out of 10 and is really functionally limiting.  She reports worsening with standing and ambulating she is somewhat better when she finds a place to sit down.  She denies any radicular pain or paresthesias down the legs.  She denies any history of polyneuropathy but does have insulin-dependent diabetes.  She also has chronic diastolic heart failure as well as chronic kidney disease all which is being treated.  Course also complicated by morbid obesity.  Her medical issues are tenuous at best but she has been doing okay.  I saw her last year and completed facet joint injections above her fusion based on MRI that was performed prior to that.  She has had a chronic pain management history with Dr. Niel Hummer.  She has been taking some medication without much relief.  She is followed by her primary care physician who does manage some of this.  She is currently not using any opioid treatment but does take Ambien at night.  She has tried some adjunct of medication treatment in the past without good results.  Injection treatment is helped in the past.  Prior MRI showed significant facet arthropathy above the fusion without much in the way of central stenosis.  She does have L5-S1 broad-based disc bulging along with arthritic changes showing some lateral recess narrowing.  Her pain is again sort of across the whole lower back may be mid lower back on the right more than left.  She has had no new injury or trauma.  She has had no focal weakness.  She continues to have knee pain  treated by Dr. Gladstone Lighter at [facility].  Review of Systems  Constitutional: Negative for chills, fever, malaise/fatigue and weight loss.  HENT: Negative for hearing loss and sinus pain.   Eyes: Negative for blurred vision, double vision and photophobia.  Respiratory: Negative for cough and shortness of breath.   Cardiovascular: Negative for chest pain, palpitations and leg swelling.  Gastrointestinal: Negative for abdominal pain, nausea and vomiting.  Genitourinary: Negative for flank pain.  Musculoskeletal: Positive for back pain and joint pain. Negative for myalgias.  Skin: Negative for itching and rash.  Neurological: Negative for tremors, focal weakness and weakness.  Endo/Heme/Allergies: Negative.   Psychiatric/Behavioral: Negative for depression.  All other systems reviewed and are negative.  Otherwise per HPI.  Assessment & Plan: Visit Diagnoses:  1. Spondylosis without myelopathy or radiculopathy, lumbar region   2. Chronic bilateral low back pain without sciatica   3. CKD (chronic kidney disease), stage III ([place])   4. Type 2 diabetes mellitus without complication, with long-term current use of insulin (HCC)     Plan: Findings:  Chronic worsening axial low back pain that seems to be related to facet joint arthritis above and below the fusion with potential for lateral recess problems although she is not having any radicular pain.  She has a complicated medical history which was reviewed in history of present illness and complicates the factors greatly.  She is also morbidly obese.  She really does  not represent a great case for ablation at the level above or below [place] body habitus thought something to be considered.  She could potentially be a candidate for spinal cord stimulator trial as she is probably not a very good surgical candidate at all if that was even something that was looked at.  She is thankfully not on any anticoagulation other than aspirin.  I think given the amount  of pain that she is having we will go ahead today and complete bilateral diagnostic medial branch blocks above the fusion.  We will see how she does at that point we did talk about activity modification and exercise.    Meds & Orders:  Meds ordered this encounter  Medications   methylPREDNISolone acetate (DEPO-MEDROL) injection 80 mg    Orders Placed This Encounter  Procedures   Facet Injection   XR C-ARM NO REPORT    Follow-up: No follow-ups on file.   Procedures: No procedures performed  Lumbar Facet Joint Intra-Articular Injection(s) with Fluoroscopic Guidance  Patient: Tiffany [name]      Date of Birth: Jul 05, 1950 MRN: [id number] PCP: Nolene Ebbs, MD      Visit Date: 08/13/2018   Universal Protocol:    Date/Time: 08/13/2018  Consent Given By: the patient  Position: PRONE   Additional Comments: Vital signs were monitored before and after the procedure. Patient was prepped and draped in the usual sterile fashion. The correct patient, procedure, and site was verified.   Injection Procedure Details:  Procedure Site One Meds Administered:  Meds ordered this encounter  Medications   methylPREDNISolone acetate (DEPO-MEDROL) injection 80 mg     Laterality: Bilateral  Location/Site:  L2-L3 L4-L5  Needle size: 22 guage  Needle type: Spinal  Needle Placement: Articular  Findings:  -Comments: Excellent flow of contrast producing a partial arthrogram.  Procedure Details: The fluoroscope beam is vertically oriented in AP, and the inferior recess is visualized beneath the lower pole of the inferior apophyseal process, which represents the target point for needle insertion. When direct visualization is difficult the target point is located at the medial projection of the vertebral pedicle. The region overlying each aforementioned target is locally anesthetized with a 1 to 2 ml. volume of 1% Lidocaine without Epinephrine.   The spinal needle was inserted into  each of the above mentioned facet joints using biplanar fluoroscopic guidance. A 0.25 to 0.5 ml. volume of Isovue-250 was injected and a partial facet joint arthrogram was obtained. A single spot film was obtained of the resulting arthrogram.    One to 1.25 ml of the steroid/anesthetic solution was then injected into each of the facet joints noted above.   Additional Comments:  The patient tolerated the procedure well Dressing: 2 x 2 sterile gauze and Band-Aid    Post-procedure details: Patient was observed during the procedure. Post-procedure instructions were reviewed.  Patient left the clinic in stable condition.    Clinical History: MRI LUMBAR SPINE WITHOUT CONTRAST  TECHNIQUE: Multiplanar, multisequence MR imaging of the lumbar spine was performed. No intravenous contrast was administered.  COMPARISON: 11/05/2012  FINDINGS: Segmentation: 5 lumbar type vertebral bodies assumed.  Alignment: No malalignment.  Vertebrae: No fracture or primary bone lesion.  Conus medullaris and cauda equina: Conus extends to the L1 level. Conus and cauda equina appear normal.  Paraspinal and other soft tissues: Simple appearing renal cysts.  Disc levels:  Degenerative disc disease at T9-10 and T10-11 with disc bulges but no apparent compressive stenosis. Those levels were not  studied in detail.  T12-L1 and L1-2 are normal.  L2-3: Mild bulging of the disc. Facet degeneration with ligamentous hypertrophy and joint effusions. Mild narrowing of the canal but no compressive stenosis in this position. This appearance could worsen with standing or flexion, based on the morphology of the facet arthropathy. This has worsened since 2014.  L3-4: Previous PLIF is solid with wide patency of the canal and foramina.  L4-5: Previous left hemilaminectomy. Endplate osteophytes and mild bulging of the disc. No compressive stenosis.  L5-S1: Central disc protrusion contacts the  thecal sac in the S1 root sleeves as they bud from the thecal sac, though definite neural compression is not demonstrated. Foramina appear sufficiently patent. Mild bilateral facet osteoarthritis. Similar appearance to the study of 2014.  IMPRESSION: Since 2014, there has been worsening of adjacent segment degenerative disease at L2-3. The disc bulges mildly. There is bilateral facet arthropathy with ligamentous hypertrophy and fluid-filled joints. There is mild stenosis of the canal in this position, but this appearance would likely worsen with standing or flexion.  Good appearance at the previous discectomy and fusion level of L3-4.  Satisfactory appearance at the previous surgical level of L4-5 with previous left hemilaminectomy. Mild degenerative changes without compressive stenosis.  L5-S1 shows a chronic central disc protrusion and mild facet degeneration but no apparent compressive stenosis or change since,2014.   Electronically Signed By: Nelson Chimes M.D. On: 09/28/2017 07:00   She reports that she has never smoked. She has never used smokeless tobacco. No results for input(s): HGBA1C, LABURIC in the last 8760 hours.  Objective:  VS:  HT:     WT:    BMI:      BP:(!) 156/73   HR:73bpm   TEMP: ( )   RESP:99 % Physical Exam Vitals signs and nursing note reviewed.  Constitutional:      General: She is not in acute distress.    Appearance: Normal appearance. She is well-developed. She is obese. She is not ill-appearing.  HENT:     Head: Normocephalic and atraumatic.  Eyes:     Conjunctiva/sclera: Conjunctivae normal.     Pupils: Pupils are equal, round, and reactive to light.  Cardiovascular:     Rate and Rhythm: Normal rate.     Pulses: Normal pulses.  Pulmonary:     Effort: Pulmonary effort is normal.  Musculoskeletal:     Right lower leg: Edema present.     Left lower leg: Edema present.     Comments: Patient is very slow to rise from a seated position to  extension.  She has pain with facet loading and extension.  No pain across the low upper back with light palpation and no trigger points to deep palpation.  No pain over the greater trochanters.  Good distal strength without clonus.  Skin:    General: Skin is warm and dry.     Findings: No erythema or rash.  Neurological:     General: No focal deficit present.     Mental Status: She is alert and oriented to person, place, and time.     Sensory: No sensory deficit.     Motor: No abnormal muscle tone.     Coordination: Coordination normal.     Gait: Gait abnormal.  Psychiatric:        Mood and Affect: Mood normal.        Behavior: Behavior normal.     Ortho Exam Imaging: No results found.  Past Medical/Family/Surgical/Social History: Medications & Allergies  reviewed per EMR, new medications updated. Patient Active Problem List   Diagnosis Date Noted   Hyperlipidemia 09/12/2016   Type 2 diabetes mellitus without complications ([place]) 39/76/7341   Tachycardia 08/26/2016   Palpitation 08/26/2016   SOB (shortness of breath) 08/26/2016   CKD (chronic kidney disease), stage III ([place]) 04/28/2014   Chronic diastolic heart failure ([place]) 05/05/2013   Essential hypertension 05/05/2013   HEART MURMUR, SYSTOLIC 93/79/0240   UNSPECIFIED HYPOTHYROIDISM 02/12/2007   OBESITY 01/08/2007   Obstructive sleep apnea 01/08/2007   Seasonal and perennial allergic rhinitis 01/08/2007   Past Medical History:  Diagnosis Date   ALLERGIC RHINITIS    Chronic diastolic heart failure ([place]) 05/05/2013   Echo 03/2018:  EF 97-35, +diastolic dysfunction, GLS -20.5%, normal RVSF, RVSP 42.5 (mild elevated), severe LAE, small pericardial eff, mild MAc, mod AV calc, mild PI   Diabetes mellitus    Hypertension    Hypothyroid    Kidney disease    Obesity    Previous back surgery    x 2   S/P arthroscopy    Sleep apnea    Family History  Problem Relation Age of Onset   Prostate cancer  Father    Congestive Heart Failure Mother    Diabetes Sister    Other Sister        septis   Breast cancer Neg Hx    Past Surgical History:  Procedure Laterality Date   BREAST BIOPSY  20+ yrs ago   dont know which breast per pt; benign   BREAST REDUCTION SURGERY  1982   KNEE SURGERY     x 2   LUMBAR DISC SURGERY     x 2   REDUCTION MAMMAPLASTY Bilateral 1985   TOE SURGERY     TOTAL ABDOMINAL HYSTERECTOMY  1997   partial hysterectomy- still has ovaries   TREATMENT FISTULA ANAL     Social History   Occupational History   Occupation: school bus driver  Tobacco Use   Smoking status: Never Smoker   Smokeless tobacco: Never Used  Substance and Sexual Activity   Alcohol use: Yes    Alcohol/week: 0.0 standard drinks    Comment: rare   Drug use: No   Sexual activity: Never    Birth control/protection: Surgical

## 2018-08-19 ENCOUNTER — Ambulatory Visit (HOSPITAL_COMMUNITY)
Admission: RE | Admit: 2018-08-19 | Discharge: 2018-08-19 | Disposition: A | Discharge status: Home / Self Care | Payer: Medicare Other | Source: Ambulatory Visit | Attending: Internal Medicine | Admitting: Internal Medicine

## 2018-08-19 ENCOUNTER — Other Ambulatory Visit: Payer: Self-pay

## 2018-08-19 VITALS — BP 160/73 | HR 76 | Temp 97.2°F | Resp 18

## 2018-08-19 DIAGNOSIS — N183 Chronic kidney disease, stage 3 unspecified: Secondary | ICD-10-CM

## 2018-08-19 DIAGNOSIS — D631 Anemia in chronic kidney disease: Secondary | ICD-10-CM | POA: Insufficient documentation

## 2018-08-19 LAB — IRON AND TIBC
Iron: 92 ug/dL (ref 28–170)
Saturation Ratios: 33 % — ABNORMAL HIGH (ref 10.4–31.8)
TIBC: 276 ug/dL (ref 250–450)
UIBC: 184 ug/dL

## 2018-08-19 LAB — FERRITIN: Ferritin: 628 ng/mL — ABNORMAL HIGH (ref 11–307)

## 2018-08-19 LAB — POCT HEMOGLOBIN-HEMACUE: Hemoglobin: 10.6 g/dL — ABNORMAL LOW (ref 12.0–15.0)

## 2018-08-19 MED ORDER — EPOETIN ALFA-EPBX 10000 UNIT/ML IJ SOLN
20000.0000 [IU] | INTRAMUSCULAR | Status: DC
  Administered 2018-08-19: 20000 [IU] via SUBCUTANEOUS
  Filled 2018-08-19: qty 2

## 2018-08-28 ENCOUNTER — Other Ambulatory Visit: Payer: Self-pay

## 2018-08-28 ENCOUNTER — Ambulatory Visit
Admission: RE | Admit: 2018-08-28 | Discharge: 2018-08-28 | Disposition: A | Discharge status: Home / Self Care | Payer: Medicare Other | Source: Ambulatory Visit | Attending: Internal Medicine | Admitting: Internal Medicine

## 2018-08-28 DIAGNOSIS — Z1231 Encounter for screening mammogram for malignant neoplasm of breast: Secondary | ICD-10-CM

## 2018-09-02 ENCOUNTER — Ambulatory Visit (HOSPITAL_COMMUNITY)
Admission: RE | Admit: 2018-09-02 | Discharge: 2018-09-02 | Disposition: A | Discharge status: Home / Self Care | Payer: Medicare Other | Source: Ambulatory Visit | Attending: Internal Medicine | Admitting: Internal Medicine

## 2018-09-02 ENCOUNTER — Other Ambulatory Visit: Payer: Self-pay

## 2018-09-02 VITALS — BP 164/70 | HR 71 | Temp 96.8°F | Resp 18

## 2018-09-02 DIAGNOSIS — N183 Chronic kidney disease, stage 3 unspecified: Secondary | ICD-10-CM

## 2018-09-02 LAB — POCT HEMOGLOBIN-HEMACUE: Hemoglobin: 10.2 g/dL — ABNORMAL LOW (ref 12.0–15.0)

## 2018-09-02 MED ORDER — EPOETIN ALFA-EPBX 10000 UNIT/ML IJ SOLN
20000.0000 [IU] | INTRAMUSCULAR | Status: DC
  Administered 2018-09-02: 20000 [IU] via SUBCUTANEOUS
  Filled 2018-09-02: qty 2

## 2018-09-16 ENCOUNTER — Other Ambulatory Visit: Payer: Self-pay

## 2018-09-16 ENCOUNTER — Ambulatory Visit (HOSPITAL_COMMUNITY)
Admission: RE | Admit: 2018-09-16 | Discharge: 2018-09-16 | Disposition: A | Discharge status: Home / Self Care | Payer: Medicare Other | Source: Ambulatory Visit | Attending: Internal Medicine | Admitting: Internal Medicine

## 2018-09-16 VITALS — BP 150/74 | HR 62 | Temp 97.3°F | Resp 18

## 2018-09-16 DIAGNOSIS — D631 Anemia in chronic kidney disease: Secondary | ICD-10-CM | POA: Insufficient documentation

## 2018-09-16 DIAGNOSIS — N183 Chronic kidney disease, stage 3 unspecified: Secondary | ICD-10-CM

## 2018-09-16 LAB — IRON AND TIBC
Iron: 72 ug/dL (ref 28–170)
Saturation Ratios: 27 % (ref 10.4–31.8)
TIBC: 272 ug/dL (ref 250–450)
UIBC: 200 ug/dL

## 2018-09-16 LAB — POCT HEMOGLOBIN-HEMACUE: Hemoglobin: 10.5 g/dL — ABNORMAL LOW (ref 12.0–15.0)

## 2018-09-16 LAB — FERRITIN: Ferritin: 553 ng/mL — ABNORMAL HIGH (ref 11–307)

## 2018-09-16 MED ORDER — EPOETIN ALFA-EPBX 10000 UNIT/ML IJ SOLN
20000.0000 [IU] | INTRAMUSCULAR | Status: DC
  Administered 2018-09-16: 20000 [IU] via SUBCUTANEOUS
  Filled 2018-09-16: qty 2

## 2018-09-25 ENCOUNTER — Telehealth: Payer: Self-pay | Admitting: Physical Medicine and Rehabilitation

## 2018-09-25 NOTE — Telephone Encounter (Signed)
L5-S1 interlam esi, may need to repeat L2-3 facets at some point though

## 2018-09-28 NOTE — Telephone Encounter (Signed)
Is auth needed for [phone]? Scheduled for 9/3.

## 2018-09-28 NOTE — Telephone Encounter (Signed)
Notification or Prior Authorization is not required for the requested services  This [employer] members plan does not currently require a prior authorization for [id number]  Decision ID #:[id number]

## 2018-09-30 ENCOUNTER — Ambulatory Visit (HOSPITAL_COMMUNITY)
Admission: RE | Admit: 2018-09-30 | Discharge: 2018-09-30 | Disposition: A | Discharge status: Home / Self Care | Payer: Medicare Other | Source: Ambulatory Visit | Attending: Internal Medicine | Admitting: Internal Medicine

## 2018-09-30 ENCOUNTER — Other Ambulatory Visit: Payer: Self-pay

## 2018-09-30 VITALS — BP 167/78 | HR 64 | Temp 97.5°F | Resp 18

## 2018-09-30 DIAGNOSIS — N183 Chronic kidney disease, stage 3 unspecified: Secondary | ICD-10-CM

## 2018-09-30 LAB — POCT HEMOGLOBIN-HEMACUE: Hemoglobin: 10.5 g/dL — ABNORMAL LOW (ref 12.0–15.0)

## 2018-09-30 MED ORDER — EPOETIN ALFA-EPBX 10000 UNIT/ML IJ SOLN
20000.0000 [IU] | INTRAMUSCULAR | Status: DC
  Administered 2018-09-30: 20000 [IU] via SUBCUTANEOUS
  Filled 2018-09-30: qty 2

## 2018-10-02 ENCOUNTER — Telehealth: Payer: Self-pay | Admitting: Physical Medicine and Rehabilitation

## 2018-10-02 NOTE — Telephone Encounter (Signed)
Pt called back and lvm stating that MBB did not help or give her any relief at all.

## 2018-10-06 ENCOUNTER — Encounter (HOSPITAL_COMMUNITY): Payer: Medicare Other

## 2018-10-08 ENCOUNTER — Ambulatory Visit: Payer: Self-pay

## 2018-10-08 ENCOUNTER — Encounter: Payer: Self-pay | Admitting: Physical Medicine and Rehabilitation

## 2018-10-08 ENCOUNTER — Ambulatory Visit (INDEPENDENT_AMBULATORY_CARE_PROVIDER_SITE_OTHER): Payer: Medicare Other | Admitting: Physical Medicine and Rehabilitation

## 2018-10-08 VITALS — BP 191/101 | HR 71

## 2018-10-08 DIAGNOSIS — M5416 Radiculopathy, lumbar region: Secondary | ICD-10-CM | POA: Diagnosis not present

## 2018-10-08 MED ORDER — METHYLPREDNISOLONE ACETATE 80 MG/ML IJ SUSP
80.0000 mg | Freq: Once | INTRAMUSCULAR | Status: AC
  Administered 2018-10-08: 80 mg

## 2018-10-08 NOTE — Progress Notes (Signed)
 .  Numeric Pain Rating Scale and Functional Assessment Average Pain 8   In the last MONTH (on 0-10 scale) has pain interfered with the following?  1. General activity like being  able to carry out your everyday physical activities such as walking, climbing stairs, carrying groceries, or moving a chair?  Rating(8)   +Driver, -BT, -Dye Allergies.  

## 2018-10-09 NOTE — Procedures (Signed)
Lumbar Epidural Steroid Injection - Interlaminar Approach with Fluoroscopic Guidance  Patient: Tiffany [name]      Date of Birth: 30-Jun-1950 MRN: [id number] PCP: Nolene Ebbs, MD      Visit Date: 10/08/2018   Universal Protocol:     Consent Given By: the patient  Position: PRONE  Additional Comments: Vital signs were monitored before and after the procedure. Patient was prepped and draped in the usual sterile fashion. The correct patient, procedure, and site was verified.   Injection Procedure Details:  Procedure Site One Meds Administered:  Meds ordered this encounter  Medications  . methylPREDNISolone acetate (DEPO-MEDROL) injection 80 mg     Laterality: Right  Location/Site:  L5-S1  Needle size: 20 G  Needle type: Tuohy  Needle Placement: Paramedian epidural  Findings:   -Comments: Excellent flow of contrast into the epidural space.  Procedure Details: Using a paramedian approach from the side mentioned above, the region overlying the inferior lamina was localized under fluoroscopic visualization and the soft tissues overlying this structure were infiltrated with 4 ml. of 1% Lidocaine without Epinephrine. The Tuohy needle was inserted into the epidural space using a paramedian approach.   The epidural space was localized using loss of resistance along with lateral and bi-planar fluoroscopic views.  After negative aspirate for air, blood, and CSF, a 2 ml. volume of Isovue-250 was injected into the epidural space and the flow of contrast was observed. Radiographs were obtained for documentation purposes.    The injectate was administered into the level noted above.   Additional Comments:  The patient tolerated the procedure well Dressing: 2 x 2 sterile gauze and Band-Aid    Post-procedure details: Patient was observed during the procedure. Post-procedure instructions were reviewed.  Patient left the clinic in stable condition.

## 2018-10-09 NOTE — Progress Notes (Signed)
Tiffany [name] - 68 y.o. female MRN [id number]  Date of birth: Nov 09, 1950  Office Visit Note: Visit Date: 10/08/2018 PCP: Nolene Ebbs, MD Referred by: Nolene Ebbs, MD  Subjective: Chief Complaint  Patient presents with  . Lower Back - Pain  . Right Thigh - Pain  . Left Thigh - Pain   HPI:  Tiffany [name] is a 68 y.o. female who comes in today For planned interlaminar epidural steroid injection at L5-S1 below her fusion.  Interestingly patient had bilateral facet joint blocks above the fusion at L2-3 with really good relief and then when we repeated the injection she did not have any relief at all and we did review the images and they appear to be well-placed.  Plan at this point would be diagnostic epidural injection below the fusion which she does have some broad bulging lateral recess narrowing.  She is getting some referral pattern into the buttocks.  If she does not get much relief with this I would at least look at diagnostic medial branch blocks of the same prior facet joints and see if that gives her diagnostic relief and we could look at potentially ablation.  Her case is complicated by morbid obesity prior lumbar fusion and diabetes.  ROS Otherwise per HPI.  Assessment & Plan: Visit Diagnoses:  1. Lumbar radiculopathy     Plan: No additional findings.   Meds & Orders:  Meds ordered this encounter  Medications  . methylPREDNISolone acetate (DEPO-MEDROL) injection 80 mg    Orders Placed This Encounter  Procedures  . XR C-ARM NO REPORT  . Epidural Steroid injection    Follow-up: Return if symptoms worsen or fail to improve.   Procedures: No procedures performed  Lumbar Epidural Steroid Injection - Interlaminar Approach with Fluoroscopic Guidance  Patient: Tiffany [name]      Date of Birth: 12-30-1950 MRN: [id number] PCP: Nolene Ebbs, MD      Visit Date: 10/08/2018   Universal Protocol:     Consent Given By: the patient  Position: PRONE   Additional Comments: Vital signs were monitored before and after the procedure. Patient was prepped and draped in the usual sterile fashion. The correct patient, procedure, and site was verified.   Injection Procedure Details:  Procedure Site One Meds Administered:  Meds ordered this encounter  Medications  . methylPREDNISolone acetate (DEPO-MEDROL) injection 80 mg     Laterality: Right  Location/Site:  L5-S1  Needle size: 20 G  Needle type: Tuohy  Needle Placement: Paramedian epidural  Findings:   -Comments: Excellent flow of contrast into the epidural space.  Procedure Details: Using a paramedian approach from the side mentioned above, the region overlying the inferior lamina was localized under fluoroscopic visualization and the soft tissues overlying this structure were infiltrated with 4 ml. of 1% Lidocaine without Epinephrine. The Tuohy needle was inserted into the epidural space using a paramedian approach.   The epidural space was localized using loss of resistance along with lateral and bi-planar fluoroscopic views.  After negative aspirate for air, blood, and CSF, a 2 ml. volume of Isovue-250 was injected into the epidural space and the flow of contrast was observed. Radiographs were obtained for documentation purposes.    The injectate was administered into the level noted above.   Additional Comments:  The patient tolerated the procedure well Dressing: 2 x 2 sterile gauze and Band-Aid    Post-procedure details: Patient was observed during the procedure. Post-procedure instructions were reviewed.  Patient left the  clinic in stable condition.   Clinical History: MRI LUMBAR SPINE WITHOUT CONTRAST  TECHNIQUE: Multiplanar, multisequence MR imaging of the lumbar spine was performed. No intravenous contrast was administered.  COMPARISON: 11/05/2012  FINDINGS: Segmentation: 5 lumbar type vertebral bodies assumed.  Alignment: No malalignment.   Vertebrae: No fracture or primary bone lesion.  Conus medullaris and cauda equina: Conus extends to the L1 level. Conus and cauda equina appear normal.  Paraspinal and other soft tissues: Simple appearing renal cysts.  Disc levels:  Degenerative disc disease at T9-10 and T10-11 with disc bulges but no apparent compressive stenosis. Those levels were not studied in detail.  T12-L1 and L1-2 are normal.  L2-3: Mild bulging of the disc. Facet degeneration with ligamentous hypertrophy and joint effusions. Mild narrowing of the canal but no compressive stenosis in this position. This appearance could worsen with standing or flexion, based on the morphology of the facet arthropathy. This has worsened since 2014.  L3-4: Previous PLIF is solid with wide patency of the canal and foramina.  L4-5: Previous left hemilaminectomy. Endplate osteophytes and mild bulging of the disc. No compressive stenosis.  L5-S1: Central disc protrusion contacts the thecal sac in the S1 root sleeves as they bud from the thecal sac, though definite neural compression is not demonstrated. Foramina appear sufficiently patent. Mild bilateral facet osteoarthritis. Similar appearance to the study of 2014.  IMPRESSION: Since 2014, there has been worsening of adjacent segment degenerative disease at L2-3. The disc bulges mildly. There is bilateral facet arthropathy with ligamentous hypertrophy and fluid-filled joints. There is mild stenosis of the canal in this position, but this appearance would likely worsen with standing or flexion.  Good appearance at the previous discectomy and fusion level of L3-4.  Satisfactory appearance at the previous surgical level of L4-5 with previous left hemilaminectomy. Mild degenerative changes without compressive stenosis.  L5-S1 shows a chronic central disc protrusion and mild facet degeneration but no apparent compressive stenosis or change since,2014.    Electronically Signed By: Nelson Chimes M.D. On: 09/28/2017 07:00     Objective:  VS:  HT:    WT:   BMI:     BP:(!) 191/101  HR:71bpm  TEMP: ( )  RESP:  Physical Exam  Ortho Exam Imaging: Xr C-arm No Report  Result Date: 10/08/2018 Please see Notes tab for imaging impression.

## 2018-10-14 ENCOUNTER — Other Ambulatory Visit: Payer: Self-pay

## 2018-10-14 ENCOUNTER — Ambulatory Visit (HOSPITAL_COMMUNITY)
Admission: RE | Admit: 2018-10-14 | Discharge: 2018-10-14 | Disposition: A | Discharge status: Home / Self Care | Payer: Medicare Other | Source: Ambulatory Visit | Attending: Internal Medicine | Admitting: Internal Medicine

## 2018-10-14 VITALS — BP 162/81 | HR 59 | Temp 97.4°F | Resp 18

## 2018-10-14 DIAGNOSIS — N183 Chronic kidney disease, stage 3 unspecified: Secondary | ICD-10-CM

## 2018-10-14 DIAGNOSIS — N184 Chronic kidney disease, stage 4 (severe): Secondary | ICD-10-CM | POA: Diagnosis not present

## 2018-10-14 DIAGNOSIS — D631 Anemia in chronic kidney disease: Secondary | ICD-10-CM | POA: Insufficient documentation

## 2018-10-14 DIAGNOSIS — Z029 Encounter for administrative examinations, unspecified: Secondary | ICD-10-CM | POA: Insufficient documentation

## 2018-10-14 LAB — IRON AND TIBC
Iron: 83 ug/dL (ref 28–170)
Saturation Ratios: 29 % (ref 10.4–31.8)
TIBC: 284 ug/dL (ref 250–450)
UIBC: 201 ug/dL

## 2018-10-14 LAB — POCT HEMOGLOBIN-HEMACUE: Hemoglobin: 10.8 g/dL — ABNORMAL LOW (ref 12.0–15.0)

## 2018-10-14 LAB — FERRITIN: Ferritin: 680 ng/mL — ABNORMAL HIGH (ref 11–307)

## 2018-10-14 MED ORDER — EPOETIN ALFA-EPBX 10000 UNIT/ML IJ SOLN
20000.0000 [IU] | INTRAMUSCULAR | Status: DC
  Administered 2018-10-14: 20000 [IU] via SUBCUTANEOUS
  Filled 2018-10-14: qty 2

## 2018-10-28 ENCOUNTER — Other Ambulatory Visit: Payer: Self-pay

## 2018-10-28 ENCOUNTER — Encounter (HOSPITAL_COMMUNITY)
Admission: RE | Admit: 2018-10-28 | Discharge: 2018-10-28 | Disposition: A | Discharge status: Home / Self Care | Payer: Medicare Other | Source: Ambulatory Visit | Attending: Internal Medicine | Admitting: Internal Medicine

## 2018-10-28 VITALS — BP 170/80 | HR 62 | Temp 97.4°F | Resp 18

## 2018-10-28 DIAGNOSIS — N183 Chronic kidney disease, stage 3 unspecified: Secondary | ICD-10-CM

## 2018-10-28 LAB — RENAL FUNCTION PANEL
Albumin: 3.9 g/dL (ref 3.5–5.0)
Anion gap: 12 (ref 5–15)
BUN: 103 mg/dL — ABNORMAL HIGH (ref 8–23)
CO2: 25 mmol/L (ref 22–32)
Calcium: 9.7 mg/dL (ref 8.9–10.3)
Chloride: 105 mmol/L (ref 98–111)
Creatinine, Ser: 2.93 mg/dL — ABNORMAL HIGH (ref 0.44–1.00)
GFR calc Af Amer: 18 mL/min — ABNORMAL LOW (ref >60)
GFR calc non Af Amer: 16 mL/min — ABNORMAL LOW (ref >60)
Glucose, Bld: 123 mg/dL — ABNORMAL HIGH (ref 70–99)
Phosphorus: 4.4 mg/dL (ref 2.5–4.6)
Potassium: 4.2 mmol/L (ref 3.5–5.1)
Sodium: 142 mmol/L (ref 135–145)

## 2018-10-28 LAB — POCT HEMOGLOBIN-HEMACUE: Hemoglobin: 12 g/dL (ref 12.0–15.0)

## 2018-10-28 MED ORDER — EPOETIN ALFA-EPBX 10000 UNIT/ML IJ SOLN
20000.0000 [IU] | INTRAMUSCULAR | Status: DC
  Filled 2018-10-28: qty 2

## 2018-10-29 LAB — PTH, INTACT AND CALCIUM
Calcium, Total (PTH): 9.7 mg/dL (ref 8.7–10.3)
PTH: 158 pg/mL — ABNORMAL HIGH (ref 15–65)

## 2018-11-11 ENCOUNTER — Encounter (HOSPITAL_COMMUNITY): Payer: Medicare Other

## 2018-11-26 ENCOUNTER — Other Ambulatory Visit (HOSPITAL_COMMUNITY): Payer: Self-pay | Admitting: *Deleted

## 2018-11-27 ENCOUNTER — Ambulatory Visit (HOSPITAL_COMMUNITY)
Admission: RE | Admit: 2018-11-27 | Discharge: 2018-11-27 | Disposition: A | Discharge status: Home / Self Care | Payer: Medicare Other | Source: Ambulatory Visit | Attending: Internal Medicine | Admitting: Internal Medicine

## 2018-11-27 ENCOUNTER — Other Ambulatory Visit: Payer: Self-pay

## 2018-11-27 VITALS — BP 164/82 | HR 56 | Temp 97.1°F | Resp 18

## 2018-11-27 DIAGNOSIS — D631 Anemia in chronic kidney disease: Secondary | ICD-10-CM | POA: Diagnosis not present

## 2018-11-27 DIAGNOSIS — N183 Chronic kidney disease, stage 3 unspecified: Secondary | ICD-10-CM | POA: Diagnosis not present

## 2018-11-27 LAB — POCT HEMOGLOBIN-HEMACUE: Hemoglobin: 11.3 g/dL — ABNORMAL LOW (ref 12.0–15.0)

## 2018-11-27 LAB — IRON AND TIBC
Iron: 95 ug/dL (ref 28–170)
Saturation Ratios: 33 % — ABNORMAL HIGH (ref 10.4–31.8)
TIBC: 288 ug/dL (ref 250–450)
UIBC: 193 ug/dL

## 2018-11-27 LAB — FERRITIN: Ferritin: 790 ng/mL — ABNORMAL HIGH (ref 11–307)

## 2018-11-27 MED ORDER — EPOETIN ALFA-EPBX 10000 UNIT/ML IJ SOLN
20000.0000 [IU] | INTRAMUSCULAR | Status: DC
  Administered 2018-11-27: 20000 [IU] via SUBCUTANEOUS
  Filled 2018-11-27: qty 2

## 2018-12-25 ENCOUNTER — Ambulatory Visit (HOSPITAL_COMMUNITY)
Admission: RE | Admit: 2018-12-25 | Discharge: 2018-12-25 | Disposition: A | Discharge status: Home / Self Care | Payer: Medicare Other | Source: Ambulatory Visit | Attending: Internal Medicine | Admitting: Internal Medicine

## 2018-12-25 ENCOUNTER — Other Ambulatory Visit: Payer: Self-pay

## 2018-12-25 VITALS — BP 148/72 | HR 55 | Temp 95.1°F | Resp 18

## 2018-12-25 DIAGNOSIS — N183 Chronic kidney disease, stage 3 unspecified: Secondary | ICD-10-CM | POA: Diagnosis not present

## 2018-12-25 DIAGNOSIS — D631 Anemia in chronic kidney disease: Secondary | ICD-10-CM | POA: Diagnosis not present

## 2018-12-25 LAB — IRON AND TIBC
Iron: 84 ug/dL (ref 28–170)
Saturation Ratios: 33 % — ABNORMAL HIGH (ref 10.4–31.8)
TIBC: 255 ug/dL (ref 250–450)
UIBC: 171 ug/dL

## 2018-12-25 LAB — POCT HEMOGLOBIN-HEMACUE: Hemoglobin: 10.3 g/dL — ABNORMAL LOW (ref 12.0–15.0)

## 2018-12-25 LAB — FERRITIN: Ferritin: 720 ng/mL — ABNORMAL HIGH (ref 11–307)

## 2018-12-25 MED ORDER — EPOETIN ALFA-EPBX 10000 UNIT/ML IJ SOLN
20000.0000 [IU] | INTRAMUSCULAR | Status: DC
  Administered 2018-12-25: 20000 [IU] via SUBCUTANEOUS
  Filled 2018-12-25: qty 2

## 2019-01-06 ENCOUNTER — Telehealth: Payer: Self-pay | Admitting: Internal Medicine

## 2019-01-06 NOTE — Telephone Encounter (Signed)
LM on cell informing pt this information has been faxed.   Nothing further needed at this time.

## 2019-01-22 ENCOUNTER — Other Ambulatory Visit: Payer: Self-pay

## 2019-01-22 ENCOUNTER — Encounter (HOSPITAL_COMMUNITY)
Admission: RE | Admit: 2019-01-22 | Discharge: 2019-01-22 | Disposition: A | Discharge status: Home / Self Care | Payer: Medicare Other | Source: Ambulatory Visit | Attending: Internal Medicine | Admitting: Internal Medicine

## 2019-01-22 VITALS — BP 152/66 | HR 58 | Temp 96.6°F | Resp 18

## 2019-01-22 DIAGNOSIS — D631 Anemia in chronic kidney disease: Secondary | ICD-10-CM | POA: Insufficient documentation

## 2019-01-22 DIAGNOSIS — N183 Chronic kidney disease, stage 3 unspecified: Secondary | ICD-10-CM | POA: Insufficient documentation

## 2019-01-22 LAB — IRON AND TIBC
Iron: 76 ug/dL (ref 28–170)
Saturation Ratios: 29 % (ref 10.4–31.8)
TIBC: 265 ug/dL (ref 250–450)
UIBC: 189 ug/dL

## 2019-01-22 LAB — POCT HEMOGLOBIN-HEMACUE: Hemoglobin: 9.7 g/dL — ABNORMAL LOW (ref 12.0–15.0)

## 2019-01-22 LAB — FERRITIN: Ferritin: 624 ng/mL — ABNORMAL HIGH (ref 11–307)

## 2019-01-22 MED ORDER — EPOETIN ALFA-EPBX 10000 UNIT/ML IJ SOLN
20000.0000 [IU] | INTRAMUSCULAR | Status: DC
  Administered 2019-01-22: 20000 [IU] via SUBCUTANEOUS

## 2019-01-22 MED ORDER — EPOETIN ALFA-EPBX 10000 UNIT/ML IJ SOLN
INTRAMUSCULAR | Status: AC
  Filled 2019-01-22: qty 2

## 2019-02-02 ENCOUNTER — Telehealth: Payer: Self-pay | Admitting: Physical Medicine and Rehabilitation

## 2019-02-02 NOTE — Telephone Encounter (Signed)
Patient called. She would like to get scheduled for her back to be seen.    Call back number: [phone]

## 2019-02-02 NOTE — Telephone Encounter (Signed)
ok 

## 2019-02-02 NOTE — Telephone Encounter (Signed)
Last injection was right L5-S1 IL on 10/08/2018. Ok to repeat if helped, same problem/side, and no new injury?

## 2019-02-03 NOTE — Telephone Encounter (Signed)
Scheduled for 02/18/19 at 1600 with driver.

## 2019-02-18 ENCOUNTER — Ambulatory Visit: Payer: Self-pay

## 2019-02-18 ENCOUNTER — Ambulatory Visit (INDEPENDENT_AMBULATORY_CARE_PROVIDER_SITE_OTHER): Payer: Medicare Other | Admitting: Physical Medicine and Rehabilitation

## 2019-02-18 ENCOUNTER — Encounter: Payer: Self-pay | Admitting: Physical Medicine and Rehabilitation

## 2019-02-18 ENCOUNTER — Other Ambulatory Visit: Payer: Self-pay

## 2019-02-18 VITALS — BP 158/88 | HR 67

## 2019-02-18 DIAGNOSIS — M5116 Intervertebral disc disorders with radiculopathy, lumbar region: Secondary | ICD-10-CM

## 2019-02-18 DIAGNOSIS — M5416 Radiculopathy, lumbar region: Secondary | ICD-10-CM | POA: Diagnosis not present

## 2019-02-18 DIAGNOSIS — M961 Postlaminectomy syndrome, not elsewhere classified: Secondary | ICD-10-CM

## 2019-02-18 MED ORDER — METHYLPREDNISOLONE ACETATE 80 MG/ML IJ SUSP
80.0000 mg | Freq: Once | INTRAMUSCULAR | Status: AC
  Administered 2019-02-18: 80 mg

## 2019-02-18 NOTE — Progress Notes (Signed)
Patient complains of lower back pain with bilateral leg pain. Patient pain has been present for a couple of weeks. Patient states resting and taking tylenol improves pain. Only takes tylenol when leaving the house. Patient states walking increases pain. Patient states she received 80% relief for last 12 weeks from the last injection/  Numeric Pain Rating Scale and Functional Assessment Average Pain (8)   In the last MONTH (on 0-10 scale) has pain interfered with the following?  1. General activity like being  able to carry out your everyday physical activities such as walking, climbing stairs, carrying groceries, or moving a chair?  Rating(8)   +Driver, -BT( aspirin) , -Dye Allergies.

## 2019-02-19 ENCOUNTER — Ambulatory Visit (HOSPITAL_COMMUNITY)
Admission: RE | Admit: 2019-02-19 | Discharge: 2019-02-19 | Disposition: A | Discharge status: Home / Self Care | Payer: Medicare Other | Source: Ambulatory Visit | Attending: Internal Medicine | Admitting: Internal Medicine

## 2019-02-19 VITALS — BP 143/71 | HR 58 | Temp 97.3°F | Resp 18

## 2019-02-19 DIAGNOSIS — N183 Chronic kidney disease, stage 3 unspecified: Secondary | ICD-10-CM

## 2019-02-19 DIAGNOSIS — D631 Anemia in chronic kidney disease: Secondary | ICD-10-CM | POA: Diagnosis not present

## 2019-02-19 LAB — IRON AND TIBC
Iron: 72 ug/dL (ref 28–170)
Saturation Ratios: 28 % (ref 10.4–31.8)
TIBC: 258 ug/dL (ref 250–450)
UIBC: 186 ug/dL

## 2019-02-19 LAB — FERRITIN: Ferritin: 581 ng/mL — ABNORMAL HIGH (ref 11–307)

## 2019-02-19 LAB — POCT HEMOGLOBIN-HEMACUE: Hemoglobin: 9.7 g/dL — ABNORMAL LOW (ref 12.0–15.0)

## 2019-02-19 MED ORDER — EPOETIN ALFA-EPBX 10000 UNIT/ML IJ SOLN
INTRAMUSCULAR | Status: AC
  Filled 2019-02-19: qty 2

## 2019-02-19 MED ORDER — EPOETIN ALFA-EPBX 10000 UNIT/ML IJ SOLN
15000.0000 [IU] | INTRAMUSCULAR | Status: DC
  Administered 2019-02-19: 15000 [IU] via SUBCUTANEOUS

## 2019-02-25 ENCOUNTER — Other Ambulatory Visit: Payer: Self-pay

## 2019-02-25 ENCOUNTER — Encounter (HOSPITAL_COMMUNITY): Payer: Self-pay | Admitting: Emergency Medicine

## 2019-02-25 ENCOUNTER — Ambulatory Visit (HOSPITAL_COMMUNITY)
Admission: EM | Admit: 2019-02-25 | Discharge: 2019-02-25 | Disposition: A | Discharge status: Home / Self Care | Payer: Medicare Other | Attending: Family Medicine | Admitting: Family Medicine

## 2019-02-25 DIAGNOSIS — I10 Essential (primary) hypertension: Secondary | ICD-10-CM | POA: Diagnosis present

## 2019-02-25 DIAGNOSIS — R1032 Left lower quadrant pain: Secondary | ICD-10-CM | POA: Insufficient documentation

## 2019-02-25 DIAGNOSIS — N3090 Cystitis, unspecified without hematuria: Secondary | ICD-10-CM

## 2019-02-25 LAB — POCT URINALYSIS DIP (DEVICE)
Bilirubin Urine: NEGATIVE
Bilirubin Urine: NEGATIVE
Glucose, UA: NEGATIVE mg/dL
Glucose, UA: NEGATIVE mg/dL
Ketones, ur: NEGATIVE mg/dL
Ketones, ur: NEGATIVE mg/dL
Nitrite: NEGATIVE
Nitrite: NEGATIVE
Protein, ur: 100 mg/dL — AB
Protein, ur: >=300 mg/dL — AB
Specific Gravity, Urine: 1.02 (ref 1.005–1.030)
Specific Gravity, Urine: 1.025 (ref 1.005–1.030)
Urobilinogen, UA: 0.2 mg/dL (ref 0.0–1.0)
Urobilinogen, UA: 0.2 mg/dL (ref 0.0–1.0)
pH: 7 (ref 5.0–8.0)
pH: 7 (ref 5.0–8.0)

## 2019-02-25 MED ORDER — CEPHALEXIN 500 MG PO CAPS: 500.0000 mg | ORAL_CAPSULE | Freq: Two times a day (BID) | ORAL | 0 refills | Status: DC

## 2019-02-25 NOTE — ED Provider Notes (Signed)
[place]    ASSESSMENT & PLAN:  1. Groin discomfort, left   2. Cystitis   3. Essential hypertension     Begin: Meds ordered this encounter  Medications  . cephALEXin (KEFLEX) 500 MG capsule    Sig: Take 1 capsule (500 mg total) by mouth 2 (two) times daily.    Dispense:  10 capsule    Refill:  0    Benign abdominal exam. No signs of pyelonephritis. Discussed. Urine culture sent. Will notify patient when results available. Will follow up with her PCP or here if not showing improvement over the next 48 hours, sooner if needed.    Discharge Instructions     Your blood pressure was noted to be elevated during your visit today. You may return here within the next few days to recheck if unable to see your primary care doctor.   BP (!) 174/73 (BP Location: Left Arm)   Pulse 69   Temp 98.4 F (36.9 C) (Oral)   Resp 18   SpO2 99%       Outlined signs and symptoms indicating need for more acute intervention. Patient verbalized understanding. After Visit Summary given.  SUBJECTIVE:  Tiffany [name] is a 69 y.o. female who complains of L "groin pain". Gradual onset over the past several days to one week. Questions if she has a UTI. "I get a UTI without symptoms sometimes too". No speific urinary frequency or dysuria reported. No vaginal discharge. Without associated flank pain, fever, chills, or abdominal pain. Gross hematuria: not present. No specific aggravating or alleviating factors reported. No LE edema. Normal PO intake without n/v/d. Without specific abdominal pain. Ambulatory without difficulty. OTC treatment: none..  Increased blood pressure noted today. Reports that she is treated for HTN. She reports taking medications as instructed, no chest pain on exertion, no dyspnea on exertion, no swelling of ankles, no orthostatic dizziness or lightheadedness, no orthopnea or paroxysmal nocturnal dyspnea, no palpitations and no intermittent claudication  symptoms.  LMP: No LMP recorded. Patient has had a hysterectomy.   OBJECTIVE:  Vitals:   02/25/19 1807  BP: (!) 174/73  Pulse: 69  Resp: 18  Temp: 98.4 F (36.9 C)  TempSrc: Oral  SpO2: 99%   General appearance: alert; no distress Lungs: unlabored respirations Abdomen: obese, soft, non-tender; no guarding or rebound tenderness but reports vague L lower and midline discomfort with palpation Back: no CVA tenderness Extremities: no edema; symmetrical with no gross deformities Skin: warm and dry Neurologic: normal gait Psychological: alert and cooperative; normal mood and affect  Labs Reviewed  POCT URINALYSIS DIP (DEVICE) - Abnormal; Notable for the following components:      Result Value   Hgb urine dipstick TRACE (*)    Protein, ur 100 (*)    Leukocytes,Ua TRACE (*)    All other components within normal limits  URINE CULTURE    No Known Allergies  Past Medical History:  Diagnosis Date  . ALLERGIC RHINITIS   . Chronic diastolic heart failure ([place]) 05/05/2013   Echo 03/2018:  EF 09-38, +diastolic dysfunction, GLS -20.5%, normal RVSF, RVSP 42.5 (mild elevated), severe LAE, small pericardial eff, mild MAc, mod AV calc, mild PI  . Diabetes mellitus   . Hypertension   . Hypothyroid   . Kidney disease   . Obesity   . Previous back surgery    x 2  . S/P arthroscopy   . Sleep apnea    Social History   Socioeconomic History  .  Marital status: Single    Spouse name: Not on file  . Number of children: Not on file  . Years of education: Not on file  . Highest education level: Not on file  Occupational History  . Occupation: school bus driver  Tobacco Use  . Smoking status: Never Smoker  . Smokeless tobacco: Never Used  Substance and Sexual Activity  . Alcohol use: Yes    Alcohol/week: 0.0 standard drinks    Comment: rare  . Drug use: No  . Sexual activity: Never    Birth control/protection: Surgical  Other Topics Concern  . Not on file  Social History  Narrative  . Not on file   Social Determinants of Health   Financial Resource Strain:   . Difficulty of Paying Living Expenses: Not on file  Food Insecurity:   . Worried About Charity fundraiser in the Last Year: Not on file  . Ran Out of Food in the Last Year: Not on file  Transportation Needs:   . Lack of Transportation (Medical): Not on file  . Lack of Transportation (Non-Medical): Not on file  Physical Activity:   . Days of Exercise per Week: Not on file  . Minutes of Exercise per Session: Not on file  Stress:   . Feeling of Stress : Not on file  Social Connections:   . Frequency of Communication with Friends and Family: Not on file  . Frequency of Social Gatherings with Friends and Family: Not on file  . Attends Religious Services: Not on file  . Active Member of Clubs or Organizations: Not on file  . Attends Archivist Meetings: Not on file  . Marital Status: Not on file  Intimate Partner Violence:   . Fear of Current or Ex-Partner: Not on file  . Emotionally Abused: Not on file  . Physically Abused: Not on file  . Sexually Abused: Not on file   Family History  Problem Relation Age of Onset  . Prostate cancer Father   . Congestive Heart Failure Mother   . Diabetes Sister   . Other Sister        septis  . Breast cancer Neg Hx        Vanessa Kick, MD 02/25/19 [phone]

## 2019-02-25 NOTE — ED Triage Notes (Signed)
Pt reports left groin pain that she has had for one week.  Pt states the pain is getting worse with more pain with ambulation.

## 2019-02-25 NOTE — Discharge Instructions (Addendum)
Your blood pressure was noted to be elevated during your visit today. You may return here within the next few days to recheck if unable to see your primary care doctor.   BP (!) 174/73 (BP Location: Left Arm)   Pulse 69   Temp 98.4 F (36.9 C) (Oral)   Resp 18   SpO2 99%

## 2019-02-27 LAB — URINE CULTURE

## 2019-03-05 ENCOUNTER — Ambulatory Visit (HOSPITAL_COMMUNITY)
Admission: RE | Admit: 2019-03-05 | Discharge: 2019-03-05 | Disposition: A | Discharge status: Home / Self Care | Payer: Medicare Other | Source: Ambulatory Visit | Attending: Internal Medicine | Admitting: Internal Medicine

## 2019-03-05 ENCOUNTER — Other Ambulatory Visit: Payer: Self-pay

## 2019-03-05 VITALS — BP 151/72 | HR 53 | Temp 97.1°F | Resp 18

## 2019-03-05 DIAGNOSIS — N183 Chronic kidney disease, stage 3 unspecified: Secondary | ICD-10-CM

## 2019-03-05 LAB — RENAL FUNCTION PANEL
Albumin: 3.3 g/dL — ABNORMAL LOW (ref 3.5–5.0)
Anion gap: 10 (ref 5–15)
BUN: 66 mg/dL — ABNORMAL HIGH (ref 8–23)
CO2: 25 mmol/L (ref 22–32)
Calcium: 9.2 mg/dL (ref 8.9–10.3)
Chloride: 105 mmol/L (ref 98–111)
Creatinine, Ser: 2.62 mg/dL — ABNORMAL HIGH (ref 0.44–1.00)
GFR calc Af Amer: 21 mL/min — ABNORMAL LOW (ref >60)
GFR calc non Af Amer: 18 mL/min — ABNORMAL LOW (ref >60)
Glucose, Bld: 215 mg/dL — ABNORMAL HIGH (ref 70–99)
Phosphorus: 3.5 mg/dL (ref 2.5–4.6)
Potassium: 4.5 mmol/L (ref 3.5–5.1)
Sodium: 140 mmol/L (ref 135–145)

## 2019-03-05 LAB — POCT HEMOGLOBIN-HEMACUE: Hemoglobin: 10 g/dL — ABNORMAL LOW (ref 12.0–15.0)

## 2019-03-05 MED ORDER — EPOETIN ALFA-EPBX 2000 UNIT/ML IJ SOLN
INTRAMUSCULAR | Status: AC
  Filled 2019-03-05: qty 1

## 2019-03-05 MED ORDER — EPOETIN ALFA-EPBX 10000 UNIT/ML IJ SOLN
15000.0000 [IU] | INTRAMUSCULAR | Status: DC
  Administered 2019-03-05: 15000 [IU] via SUBCUTANEOUS

## 2019-03-05 MED ORDER — EPOETIN ALFA-EPBX 10000 UNIT/ML IJ SOLN
INTRAMUSCULAR | Status: AC
  Filled 2019-03-05: qty 1

## 2019-03-05 MED ORDER — EPOETIN ALFA-EPBX 3000 UNIT/ML IJ SOLN
INTRAMUSCULAR | Status: AC
  Filled 2019-03-05: qty 1

## 2019-03-06 LAB — PTH, INTACT AND CALCIUM
Calcium, Total (PTH): 9 mg/dL (ref 8.7–10.3)
PTH: 123 pg/mL — ABNORMAL HIGH (ref 15–65)

## 2019-03-11 ENCOUNTER — Other Ambulatory Visit: Payer: Self-pay | Admitting: Internal Medicine

## 2019-03-11 NOTE — Telephone Encounter (Signed)
Ambien refill e-sent 

## 2019-03-19 ENCOUNTER — Encounter (HOSPITAL_COMMUNITY): Payer: Medicare Other

## 2019-03-19 ENCOUNTER — Other Ambulatory Visit: Payer: Self-pay

## 2019-03-19 ENCOUNTER — Encounter (HOSPITAL_COMMUNITY)
Admission: RE | Admit: 2019-03-19 | Discharge: 2019-03-19 | Disposition: A | Discharge status: Home / Self Care | Payer: Medicare Other | Source: Ambulatory Visit | Attending: Internal Medicine | Admitting: Internal Medicine

## 2019-03-19 VITALS — BP 168/77 | HR 57 | Temp 97.2°F | Resp 18

## 2019-03-19 DIAGNOSIS — N183 Chronic kidney disease, stage 3 unspecified: Secondary | ICD-10-CM | POA: Diagnosis not present

## 2019-03-19 DIAGNOSIS — D631 Anemia in chronic kidney disease: Secondary | ICD-10-CM | POA: Diagnosis not present

## 2019-03-19 LAB — IRON AND TIBC
Iron: 81 ug/dL (ref 28–170)
Saturation Ratios: 29 % (ref 10.4–31.8)
TIBC: 281 ug/dL (ref 250–450)
UIBC: 200 ug/dL

## 2019-03-19 LAB — FERRITIN: Ferritin: 514 ng/mL — ABNORMAL HIGH (ref 11–307)

## 2019-03-19 LAB — POCT HEMOGLOBIN-HEMACUE: Hemoglobin: 10.4 g/dL — ABNORMAL LOW (ref 12.0–15.0)

## 2019-03-19 MED ORDER — EPOETIN ALFA-EPBX 10000 UNIT/ML IJ SOLN
INTRAMUSCULAR | Status: AC
  Administered 2019-03-19: 10000 [IU]
  Filled 2019-03-19: qty 1

## 2019-03-19 MED ORDER — EPOETIN ALFA-EPBX 10000 UNIT/ML IJ SOLN: 15000.0000 [IU] | INTRAMUSCULAR | Status: DC

## 2019-03-19 MED ORDER — EPOETIN ALFA-EPBX 2000 UNIT/ML IJ SOLN
INTRAMUSCULAR | Status: AC
  Administered 2019-03-19: 2000 [IU]
  Filled 2019-03-19: qty 1

## 2019-03-19 MED ORDER — EPOETIN ALFA-EPBX 3000 UNIT/ML IJ SOLN
INTRAMUSCULAR | Status: AC
  Administered 2019-03-19: 3000 [IU]
  Filled 2019-03-19: qty 1

## 2019-04-02 ENCOUNTER — Other Ambulatory Visit: Payer: Self-pay

## 2019-04-02 ENCOUNTER — Encounter (HOSPITAL_COMMUNITY)
Admission: RE | Admit: 2019-04-02 | Discharge: 2019-04-02 | Disposition: A | Discharge status: Home / Self Care | Payer: Medicare Other | Source: Ambulatory Visit | Attending: Internal Medicine | Admitting: Internal Medicine

## 2019-04-02 VITALS — BP 157/70 | HR 59 | Temp 96.6°F | Resp 18

## 2019-04-02 DIAGNOSIS — N183 Chronic kidney disease, stage 3 unspecified: Secondary | ICD-10-CM | POA: Diagnosis not present

## 2019-04-02 LAB — POCT HEMOGLOBIN-HEMACUE: Hemoglobin: 10.3 g/dL — ABNORMAL LOW (ref 12.0–15.0)

## 2019-04-02 MED ORDER — EPOETIN ALFA-EPBX 10000 UNIT/ML IJ SOLN
15000.0000 [IU] | INTRAMUSCULAR | Status: DC
  Administered 2019-04-02: 15000 [IU] via SUBCUTANEOUS

## 2019-04-02 MED ORDER — EPOETIN ALFA-EPBX 3000 UNIT/ML IJ SOLN
INTRAMUSCULAR | Status: AC
  Filled 2019-04-02: qty 1

## 2019-04-02 MED ORDER — EPOETIN ALFA-EPBX 10000 UNIT/ML IJ SOLN
INTRAMUSCULAR | Status: AC
  Filled 2019-04-02: qty 1

## 2019-04-02 MED ORDER — EPOETIN ALFA-EPBX 2000 UNIT/ML IJ SOLN
INTRAMUSCULAR | Status: AC
  Filled 2019-04-02: qty 1

## 2019-04-14 ENCOUNTER — Telehealth: Payer: Self-pay | Admitting: *Deleted

## 2019-04-14 NOTE — Telephone Encounter (Signed)
   [facility] Pre-operative Risk Assessment    Request for surgical clearance:  1. What type of surgery is being performed? LEFT TOTAL KNEE ARTHROPLASTY   2. When is this surgery scheduled? 07/01/19   3. What type of clearance is required (medical clearance vs. Pharmacy clearance to hold med vs. Both)? MEDICAL  4. Are there any medications that need to be held prior to surgery and how long? ASA   5. Practice name and name of physician performing surgery? [facility]; DR.MATTHEW OLIN   6. What is your office phone number [phone] ATTN: SHERRY WILLIS   7.   What is your office fax number [phone]  8.   Anesthesia type (None, local, MAC, general) ? SPINAL    Julaine Hua 04/14/2019, 12:51 PM  _________________________________________________________________   (provider comments below)

## 2019-04-14 NOTE — Telephone Encounter (Signed)
Primary Cardiologist:Henry Nicholes Stairs III, MD  Chart reviewed as part of pre-operative protocol coverage. Because of Retia Cordle Kempker's past medical history and time since last visit, he/she will require a follow-up visit in order to better assess preoperative cardiovascular risk.  Pre-op covering staff: - Please schedule appointment and call patient to inform them. - Please contact requesting surgeon's office via preferred method (i.e, phone, fax) to inform them of need for appointment prior to surgery.  If applicable, this message will also be routed to pharmacy pool and/or primary cardiologist for input on holding anticoagulant/antiplatelet agent as requested below so that this information is available at time of patient's appointment.   Jossie Ng. [facility] [place] [address] Office [phone] Fax [phone]

## 2019-04-14 NOTE — Telephone Encounter (Signed)
Pt has been made aware he will need appt for pre op clearance. Pt has been scheduled for pre op appt on 05/12/19 @ 10:15 with Vin Bhagat, PAC. Pt thanked me for the call and the help. I will forward clearance notes to [facility] for upcoming appt. I will send FYI to Dr. Paralee Cancel. Will remove from the pre op call back pool.

## 2019-04-16 ENCOUNTER — Encounter (HOSPITAL_COMMUNITY)
Admission: RE | Admit: 2019-04-16 | Discharge: 2019-04-16 | Disposition: A | Discharge status: Home / Self Care | Payer: Medicare Other | Source: Ambulatory Visit | Attending: Internal Medicine | Admitting: Internal Medicine

## 2019-04-16 ENCOUNTER — Other Ambulatory Visit: Payer: Self-pay

## 2019-04-16 VITALS — BP 174/79 | HR 64 | Temp 97.3°F | Resp 18

## 2019-04-16 DIAGNOSIS — N183 Chronic kidney disease, stage 3 unspecified: Secondary | ICD-10-CM

## 2019-04-16 DIAGNOSIS — D631 Anemia in chronic kidney disease: Secondary | ICD-10-CM | POA: Diagnosis not present

## 2019-04-16 DIAGNOSIS — N184 Chronic kidney disease, stage 4 (severe): Secondary | ICD-10-CM | POA: Diagnosis not present

## 2019-04-16 LAB — IRON AND TIBC
Iron: 53 ug/dL (ref 28–170)
Saturation Ratios: 19 % (ref 10.4–31.8)
TIBC: 280 ug/dL (ref 250–450)
UIBC: 227 ug/dL

## 2019-04-16 LAB — POCT HEMOGLOBIN-HEMACUE: Hemoglobin: 10 g/dL — ABNORMAL LOW (ref 12.0–15.0)

## 2019-04-16 LAB — FERRITIN: Ferritin: 565 ng/mL — ABNORMAL HIGH (ref 11–307)

## 2019-04-16 MED ORDER — EPOETIN ALFA-EPBX 10000 UNIT/ML IJ SOLN
INTRAMUSCULAR | Status: AC
  Administered 2019-04-16: 15000 [IU] via SUBCUTANEOUS
  Filled 2019-04-16: qty 2

## 2019-04-16 MED ORDER — EPOETIN ALFA-EPBX 10000 UNIT/ML IJ SOLN: 15000.0000 [IU] | INTRAMUSCULAR | Status: DC

## 2019-04-30 ENCOUNTER — Other Ambulatory Visit: Payer: Self-pay

## 2019-04-30 ENCOUNTER — Encounter (HOSPITAL_COMMUNITY)
Admission: RE | Admit: 2019-04-30 | Discharge: 2019-04-30 | Disposition: A | Discharge status: Home / Self Care | Payer: Medicare Other | Source: Ambulatory Visit | Attending: Internal Medicine | Admitting: Internal Medicine

## 2019-04-30 VITALS — BP 149/82 | HR 59 | Temp 96.8°F | Resp 18

## 2019-04-30 DIAGNOSIS — N183 Chronic kidney disease, stage 3 unspecified: Secondary | ICD-10-CM

## 2019-04-30 DIAGNOSIS — N184 Chronic kidney disease, stage 4 (severe): Secondary | ICD-10-CM | POA: Diagnosis not present

## 2019-04-30 LAB — POCT HEMOGLOBIN-HEMACUE: Hemoglobin: 10.1 g/dL — ABNORMAL LOW (ref 12.0–15.0)

## 2019-04-30 MED ORDER — EPOETIN ALFA-EPBX 2000 UNIT/ML IJ SOLN
INTRAMUSCULAR | Status: AC
  Administered 2019-04-30: 2000 [IU] via SUBCUTANEOUS
  Filled 2019-04-30: qty 1

## 2019-04-30 MED ORDER — EPOETIN ALFA-EPBX 3000 UNIT/ML IJ SOLN
INTRAMUSCULAR | Status: AC
  Administered 2019-04-30: 3000 [IU] via SUBCUTANEOUS
  Filled 2019-04-30: qty 1

## 2019-04-30 MED ORDER — EPOETIN ALFA-EPBX 10000 UNIT/ML IJ SOLN
INTRAMUSCULAR | Status: AC
  Administered 2019-04-30: 10000 [IU] via SUBCUTANEOUS
  Filled 2019-04-30: qty 1

## 2019-04-30 MED ORDER — EPOETIN ALFA-EPBX 10000 UNIT/ML IJ SOLN: 15000.0000 [IU] | INTRAMUSCULAR | Status: DC

## 2019-05-11 NOTE — Progress Notes (Signed)
Cardiology Office Note    Date:  05/12/2019   ID:  Tiffany [name], DOB 06-Jun-1950, MRN [id number]  PCP:  Nolene Ebbs, MD  Cardiologist:   Dr. Tamala Julian  Chief Complaint:  Surgical clearance for LEFT TOTAL KNEE ARTHROPLASTY   History of Present Illness:   Tiffany [name] is a 69 y.o. female hypertension, chronic diastolic heart failure, Obesity, chronic kidney disease stage, IDA on (iron infusion intermittently), HLD and OSA seen for surgical clearance.   Prior shortness of breath felt multifactorial and related to diastolic heart failure, anemia, deconditioning and obesity.  Echocardiogram February 2020 showed normal LV function, impaired diastolic relaxation with elevated LVEDP, mildly elevated RVSP.  Last seen by Dr. Tamala Julian 05/2018.  Here today for surgical clearance.  Unable to do regular exercise since pandemic started.  Now has left knee pain.  However stays active doing house or work without any limitation.  She has chronic lower extremity edema and takes torsemide 100 mg daily.  Breathing stable.  Denies chest pain, unusual shortness of breath, orthopnea, PND, syncope or melena.  Compliant with her CPAP and medications.   Past Medical History:  Diagnosis Date  . ALLERGIC RHINITIS   . Chronic diastolic heart failure ([place]) 05/05/2013   Echo 03/2018:  EF 54-65, +diastolic dysfunction, GLS -20.5%, normal RVSF, RVSP 42.5 (mild elevated), severe LAE, small pericardial eff, mild MAc, mod AV calc, mild PI  . Diabetes mellitus   . Hypertension   . Hypothyroid   . Kidney disease   . Obesity   . Previous back surgery    x 2  . S/P arthroscopy   . Sleep apnea     Past Surgical History:  Procedure Laterality Date  . BREAST BIOPSY  20+ yrs ago   dont know which breast per pt; benign  . BREAST REDUCTION SURGERY  1982  . KNEE SURGERY     x 2  . LUMBAR DISC SURGERY     x 2  . REDUCTION MAMMAPLASTY Bilateral 1985  . TOE SURGERY    . TOTAL ABDOMINAL HYSTERECTOMY  1997   partial  hysterectomy- still has ovaries  . TREATMENT FISTULA ANAL      Current Medications: Prior to Admission medications   Medication Sig Start Date End Date Taking? Authorizing Provider  allopurinol (ZYLOPRIM) 100 MG tablet Take 100 mg by mouth 2 (two) times daily. 07/30/16   [provider]  amLODipine (NORVASC) 10 MG tablet Take 10 mg by mouth 2 (two) times daily.     [provider]  aspirin 81 MG tablet Take 81 mg by mouth daily.      [provider]  B Complex Vitamins (B COMPLEX PO) Take 1 tablet by mouth daily.    [provider]  B-D INS SYRINGE 0.5CC/30GX1/2" 30G X 1/2" 0.5 ML MISC TAKE AS DIRECTED TWICE A DAY 08/12/15   [provider]  BIOTIN 5000 PO Take 1 capsule by mouth daily.     [provider]  calcitRIOL (ROCALTROL) 0.25 MCG capsule Take 0.25 mcg by mouth daily.    [provider]  carvedilol (COREG) 3.125 MG tablet Take 3.125 mg by mouth 2 (two) times daily. 09/11/18   [provider]  cephALEXin (KEFLEX) 500 MG capsule Take 1 capsule (500 mg total) by mouth 2 (two) times daily. 02/25/19   Vanessa Kick, MD  cetirizine (ZYRTEC) 10 MG tablet Take 10 mg by mouth daily as needed for allergies.    [provider]  cloNIDine (CATAPRES - DOSED IN MG/24 HR) 0.3 mg/24hr Place 1 patch onto the skin once a week. Patient needs to apply a new patch today     [provider]  diclofenac sodium (VOLTAREN) 1 % GEL APPLY 2 GRAMS TO THE AFFECTED AREA(S) BY TOPICAL ROUTE 4 TIMES PER DAY 02/12/18   [provider]  doxazosin (CARDURA) 1 MG tablet Take 1 mg by mouth at bedtime. 02/05/18   [provider]  Fluticasone-Umeclidin-Vilant (TRELEGY ELLIPTA) 100-62.5-25 MCG/INH AEPB Inhale 1 puff into the lungs as needed (SOB).    [provider]  insulin glargine (LANTUS) 100 UNIT/ML injection Inject 30 Units into the skin at bedtime. As directed    [provider]  levothyroxine (LEVO-T)  112 MCG tablet Take 112 mcg by mouth daily before breakfast. 3 tablets once a week; 1 tablet daily all other days    [provider]  Multiple Vitamins-Minerals (VISION-VITE PRESERVE PO) Take 1 tablet by mouth daily.     [provider]  rosuvastatin (CRESTOR) 10 MG tablet Take 10 mg by mouth daily.      [provider]  torsemide (DEMADEX) 100 MG tablet Take 100 mg by mouth daily.     [provider]  zolpidem (AMBIEN) 10 MG tablet TAKE 1 TABLET BY MOUTH EVERY DAY AT BEDTIME AS NEEDED FOR SLEEP 03/11/19   Deneise Lever, MD    Allergies:   Patient has no known allergies.   Social History   Socioeconomic History  . Marital status: Single    Spouse name: Not on file  . Number of children: Not on file  . Years of education: Not on file  . Highest education level: Not on file  Occupational History  . Occupation: school bus driver  Tobacco Use  . Smoking status: Never Smoker  . Smokeless tobacco: Never Used  Substance and Sexual Activity  . Alcohol use: Yes    Alcohol/week: 0.0 standard drinks    Comment: rare  . Drug use: No  . Sexual activity: Never    Birth control/protection: Surgical  Other Topics Concern  . Not on file  Social History Narrative  . Not on file   Social Determinants of Health   Financial Resource Strain:   . Difficulty of Paying Living Expenses:   Food Insecurity:   . Worried About Charity fundraiser in the Last Year:   . Arboriculturist in the Last Year:   Transportation Needs:   . Film/video editor (Medical):   Marland Kitchen Lack of Transportation (Non-Medical):   Physical Activity:   . Days of Exercise per Week:   . Minutes of Exercise per Session:   Stress:   . Feeling of Stress :   Social Connections:   . Frequency of Communication with Friends and Family:   . Frequency of Social Gatherings with Friends and Family:   . Attends Religious Services:   . Active Member of Clubs or Organizations:   . Attends Theatre manager Meetings:   Marland Kitchen Marital Status:      Family History:  The patient's family history includes Congestive Heart Failure in her mother; Diabetes in her sister; Other in her sister; Prostate cancer in her father.   ROS:   Please see the history of present illness.    ROS All other systems reviewed and are negative.   PHYSICAL EXAM:   VS:  BP 138/80   Pulse 60   Ht _0  (1.778  m)   Wt 262 lb (118.8 kg)   SpO2 99%   BMI 37.59 kg/m    GEN: Well nourished, well developed, in no acute distress  HEENT: normal  Neck: no JVD, carotid bruits, or masses Cardiac: RRR; 2/3 systolic murmurs, rubs, or gallops, 1+ bilateral lower extremity edema with venous stasis Respiratory:  clear to auscultation bilaterally, normal work of breathing GI: soft, nontender, nondistended, + BS MS: no deformity or atrophy, brace on  left knee Skin: warm and dry, no rash Neuro:  Alert and Oriented x 3, Strength and sensation are intact Psych: euthymic mood, full affect  Wt Readings from Last 3 Encounters:  05/12/19 262 lb (118.8 kg)  08/05/18 274 lb (124.3 kg)  05/26/18 289 lb (131.1 kg)      Studies/Labs Reviewed:   EKG:  EKG is ordered today.  The ekg ordered today demonstrates sinus rhythm at rate of 60 bpm  Recent Labs: 03/05/2019: BUN 66; Creatinine, Ser 2.62; Potassium 4.5; Sodium 140 04/30/2019: Hemoglobin 10.1   Lipid Panel No results found for: CHOL, TRIG, HDL, CHOLHDL, VLDL, LDLCALC, LDLDIRECT  Additional studies/ records that were reviewed today include:   Echocardiogram: 03/2018 1. The left ventricle has normal systolic function of 09-38%. The cavity  size is normal. There is no increased left ventricular wall thickness.  Echo evidence of impaired diastolic relaxation Elevated left ventricular  end-diastolic pressure.  2. Global longitudinal strain -20.5% (normal).  3. The right ventricle has normal systolic function. The cavity in normal  in size. There is no increase in  right ventricular wall thickness. Right  ventricular systolic pressure is mildly elevated with an estimated  pressure of 42.5 mmHg.  4. Severely dilated left atrial size.  5. Small pericardial effusion.  6. The pericardial effusion is posterior to the left ventricle.  7. The mitral valve is normal in structure There is mild mitral annular  calcification present.  8. The aortic valve is tricuspid. There is moderate thickening and  moderate calcification of the aortic valve.  9. The pulmonic valve is normal in structure. Pulmonic valve  regurgitation is mild by color flow Doppler.  10. The ascending aorta and aortic root are normal in size and structure.  11. The inferior vena cava was dilated in size with <50% respiratory  variability.     ASSESSMENT & PLAN:    1. Chronic diastolic heart failure She has chronic dyspnea which felt multifactorial.  Dyspnea and lower extremity edema is stable for years.  Continue current medical therapy.  2.  Obstructive sleep apnea on CPAP -Reports compliance  3.  Hypertension -Blood pressure stable on current medication  4.  Hyperlipidemia -Continue statin -Followed by PCP  5.  Cardiac clearance  Given past medical history and time since last visit, based on ACC/AHA guidelines, Reyna Lorenzi would be at acceptable risk for the planned procedure without further cardiovascular testing.   Okay to hold aspirin for 5 to 7 days prior to surgery.  Restart per direction of surgeon.   I will route this recommendation to the requesting party via Epic fax function and remove from pre-op pool.  Please call with questions.   Medication Adjustments/Labs and Tests Ordered: Current medicines are reviewed at length with the patient today.  Concerns regarding medicines are outlined above.  Medication changes, Labs and Tests ordered today are listed in the Patient Instructions below. Patient Instructions  Medication Instructions:  Your physician  recommends that you continue on your current medications as directed. Please  refer to the Current Medication list given to you today.  *If you need a refill on your cardiac medications before your next appointment, please call your pharmacy*   Lab Work: None ordered  If you have labs (blood work) drawn today and your tests are completely normal, you will receive your results only by: Marland Kitchen MyChart Message (if you have MyChart) OR . A paper copy in the mail If you have any lab test that is abnormal or we need to change your treatment, we will call you to review the results.   Testing/Procedures: None ordered   Follow-Up: At [facility], you and your health needs are our priority.  As part of our continuing mission to provide you with exceptional heart care, we have created designated Provider Care Teams.  These Care Teams include your primary Cardiologist (physician) and Advanced Practice Providers (APPs -  Physician Assistants and Nurse Practitioners) who all work together to provide you with the care you need, when you need it.  We recommend signing up for the patient portal called "MyChart".  Sign up information is provided on this After Visit Summary.  MyChart is used to connect with patients for Virtual Visits (Telemedicine).  Patients are able to view lab/test results, encounter notes, upcoming appointments, etc.  Non-urgent messages can be sent to your provider as well.   To learn more about what you can do with MyChart, go to [email or web address].    Your next appointment:   2 month(s)  The format for your next appointment:   In Person  Provider:   You may see Sinclair Grooms, MD or one of the following Advanced Practice Providers on your designated Care Team:    Truitt Merle, NP  Cecilie Kicks, NP  Kathyrn Drown, NP    Other Instructions      Signed, Leanor Kail, [place]  05/12/2019 10:55 AM    [address],  [place], [place]  [address] Phone: [phone]; Fax: [phone]

## 2019-05-12 ENCOUNTER — Ambulatory Visit: Payer: Medicare Other | Admitting: Physician Assistant

## 2019-05-12 ENCOUNTER — Encounter: Payer: Self-pay | Admitting: Physician Assistant

## 2019-05-12 ENCOUNTER — Other Ambulatory Visit: Payer: Self-pay

## 2019-05-12 VITALS — BP 138/80 | HR 60 | Ht 70.0 in | Wt 262.0 lb

## 2019-05-12 DIAGNOSIS — I10 Essential (primary) hypertension: Secondary | ICD-10-CM

## 2019-05-12 DIAGNOSIS — N1831 Chronic kidney disease, stage 3a: Secondary | ICD-10-CM

## 2019-05-12 DIAGNOSIS — R011 Cardiac murmur, unspecified: Secondary | ICD-10-CM

## 2019-05-12 DIAGNOSIS — I5032 Chronic diastolic (congestive) heart failure: Secondary | ICD-10-CM | POA: Diagnosis not present

## 2019-05-12 DIAGNOSIS — Z01810 Encounter for preprocedural cardiovascular examination: Secondary | ICD-10-CM

## 2019-05-12 DIAGNOSIS — G4733 Obstructive sleep apnea (adult) (pediatric): Secondary | ICD-10-CM

## 2019-05-12 NOTE — Patient Instructions (Signed)
Medication Instructions:  Your physician recommends that you continue on your current medications as directed. Please refer to the Current Medication list given to you today.  *If you need a refill on your cardiac medications before your next appointment, please call your pharmacy*   Lab Work: None ordered  If you have labs (blood work) drawn today and your tests are completely normal, you will receive your results only by: Marland Kitchen MyChart Message (if you have MyChart) OR . A paper copy in the mail If you have any lab test that is abnormal or we need to change your treatment, we will call you to review the results.   Testing/Procedures: None ordered   Follow-Up: At [facility], you and your health needs are our priority.  As part of our continuing mission to provide you with exceptional heart care, we have created designated Provider Care Teams.  These Care Teams include your primary Cardiologist (physician) and Advanced Practice Providers (APPs -  Physician Assistants and Nurse Practitioners) who all work together to provide you with the care you need, when you need it.  We recommend signing up for the patient portal called "MyChart".  Sign up information is provided on this After Visit Summary.  MyChart is used to connect with patients for Virtual Visits (Telemedicine).  Patients are able to view lab/test results, encounter notes, upcoming appointments, etc.  Non-urgent messages can be sent to your provider as well.   To learn more about what you can do with MyChart, go to [email or web address].    Your next appointment:   2 month(s)  The format for your next appointment:   In Person  Provider:   You may see Sinclair Grooms, MD or one of the following Advanced Practice Providers on your designated Care Team:    Truitt Merle, NP  Cecilie Kicks, NP  Kathyrn Drown, NP    Other Instructions

## 2019-05-14 ENCOUNTER — Ambulatory Visit (HOSPITAL_COMMUNITY)
Admission: RE | Admit: 2019-05-14 | Discharge: 2019-05-14 | Disposition: A | Discharge status: Home / Self Care | Payer: Medicare Other | Source: Ambulatory Visit | Attending: Internal Medicine | Admitting: Internal Medicine

## 2019-05-14 ENCOUNTER — Other Ambulatory Visit: Payer: Self-pay

## 2019-05-14 VITALS — BP 164/74 | HR 55 | Temp 96.6°F | Resp 18

## 2019-05-14 DIAGNOSIS — N184 Chronic kidney disease, stage 4 (severe): Secondary | ICD-10-CM | POA: Diagnosis not present

## 2019-05-14 DIAGNOSIS — D631 Anemia in chronic kidney disease: Secondary | ICD-10-CM | POA: Insufficient documentation

## 2019-05-14 DIAGNOSIS — N183 Chronic kidney disease, stage 3 unspecified: Secondary | ICD-10-CM

## 2019-05-14 LAB — IRON AND TIBC
Iron: 74 ug/dL (ref 28–170)
Saturation Ratios: 25 % (ref 10.4–31.8)
TIBC: 300 ug/dL (ref 250–450)
UIBC: 226 ug/dL

## 2019-05-14 LAB — FERRITIN: Ferritin: 589 ng/mL — ABNORMAL HIGH (ref 11–307)

## 2019-05-14 LAB — POCT HEMOGLOBIN-HEMACUE: Hemoglobin: 10.2 g/dL — ABNORMAL LOW (ref 12.0–15.0)

## 2019-05-14 MED ORDER — EPOETIN ALFA-EPBX 10000 UNIT/ML IJ SOLN
INTRAMUSCULAR | Status: AC
  Administered 2019-05-14: 10000 [IU] via SUBCUTANEOUS
  Filled 2019-05-14: qty 1

## 2019-05-14 MED ORDER — EPOETIN ALFA-EPBX 10000 UNIT/ML IJ SOLN: 15000.0000 [IU] | INTRAMUSCULAR | Status: DC

## 2019-05-14 MED ORDER — EPOETIN ALFA-EPBX 3000 UNIT/ML IJ SOLN
INTRAMUSCULAR | Status: AC
  Administered 2019-05-14: 3000 [IU] via SUBCUTANEOUS
  Filled 2019-05-14: qty 1

## 2019-05-14 MED ORDER — EPOETIN ALFA-EPBX 2000 UNIT/ML IJ SOLN
INTRAMUSCULAR | Status: AC
  Administered 2019-05-14: 2000 [IU] via SUBCUTANEOUS
  Filled 2019-05-14: qty 1

## 2019-05-28 ENCOUNTER — Ambulatory Visit (HOSPITAL_COMMUNITY)
Admission: RE | Admit: 2019-05-28 | Discharge: 2019-05-28 | Disposition: A | Discharge status: Home / Self Care | Payer: Medicare Other | Source: Ambulatory Visit | Attending: Internal Medicine | Admitting: Internal Medicine

## 2019-05-28 ENCOUNTER — Other Ambulatory Visit: Payer: Self-pay

## 2019-05-28 VITALS — BP 155/74 | HR 63 | Temp 98.1°F | Resp 18

## 2019-05-28 DIAGNOSIS — N183 Chronic kidney disease, stage 3 unspecified: Secondary | ICD-10-CM | POA: Insufficient documentation

## 2019-05-28 LAB — POCT HEMOGLOBIN-HEMACUE: Hemoglobin: 10.1 g/dL — ABNORMAL LOW (ref 12.0–15.0)

## 2019-05-28 MED ORDER — EPOETIN ALFA-EPBX 3000 UNIT/ML IJ SOLN
INTRAMUSCULAR | Status: AC
  Administered 2019-05-28: 3000 [IU] via SUBCUTANEOUS
  Filled 2019-05-28: qty 1

## 2019-05-28 MED ORDER — EPOETIN ALFA-EPBX 10000 UNIT/ML IJ SOLN: 15000.0000 [IU] | INTRAMUSCULAR | Status: DC

## 2019-05-28 MED ORDER — EPOETIN ALFA-EPBX 2000 UNIT/ML IJ SOLN
INTRAMUSCULAR | Status: AC
  Administered 2019-05-28: 2000 [IU] via SUBCUTANEOUS
  Filled 2019-05-28: qty 1

## 2019-05-28 MED ORDER — EPOETIN ALFA-EPBX 10000 UNIT/ML IJ SOLN
INTRAMUSCULAR | Status: AC
  Administered 2019-05-28: 10000 [IU] via SUBCUTANEOUS
  Filled 2019-05-28: qty 1

## 2019-06-07 NOTE — Progress Notes (Signed)
Tiffany [name] - 69 y.o. female MRN [id number]  Date of birth: [birth year]  Office Visit Note: Visit Date: 02/18/2019 PCP: Nolene Ebbs, MD Referred by: Nolene Ebbs, MD  Subjective: Chief Complaint  Patient presents with  . Lower Back - Pain   HPI:  Tiffany [name] is a 69 y.o. female who comes in today for planned Right L5-S1 lumbar epidural steroid injection with fluoroscopic guidance.  The patient has failed conservative care including home exercise, medications, time and activity modification.  This injection will be diagnostic and hopefully therapeutic.  Please see requesting physician notes for further details and justification.   ROS Otherwise per HPI.  Assessment & Plan: Visit Diagnoses:  1. Lumbar radiculopathy   2. Post laminectomy syndrome   3. Radiculopathy due to lumbar intervertebral disc disorder     Plan: No additional findings.   Meds & Orders:  Meds ordered this encounter  Medications  . methylPREDNISolone acetate (DEPO-MEDROL) injection 80 mg    Orders Placed This Encounter  Procedures  . XR C-ARM NO REPORT  . Epidural Steroid injection    Follow-up: Return if symptoms worsen or fail to improve.   Procedures: No procedures performed  Lumbar Epidural Steroid Injection - Interlaminar Approach with Fluoroscopic Guidance  Patient: Tiffany [name]      Date of Birth: March 24, 1950 MRN: [id number] PCP: Nolene Ebbs, MD      Visit Date: 02/18/2019   Universal Protocol:     Consent Given By: the patient  Position: PRONE  Additional Comments: Vital signs were monitored before and after the procedure. Patient was prepped and draped in the usual sterile fashion. The correct patient, procedure, and site was verified.   Injection Procedure Details:  Procedure Site One Meds Administered:  Meds ordered this encounter  Medications  . methylPREDNISolone acetate (DEPO-MEDROL) injection 80 mg     Laterality: Right  Location/Site:   L5-S1  Needle size: 20 G  Needle type: Tuohy  Needle Placement: Paramedian epidural  Findings:   -Comments: Excellent flow of contrast into the epidural space.  Procedure Details: Using a paramedian approach from the side mentioned above, the region overlying the inferior lamina was localized under fluoroscopic visualization and the soft tissues overlying this structure were infiltrated with 4 ml. of 1% Lidocaine without Epinephrine. The Tuohy needle was inserted into the epidural space using a paramedian approach.   The epidural space was localized using loss of resistance along with lateral and bi-planar fluoroscopic views.  After negative aspirate for air, blood, and CSF, a 2 ml. volume of Isovue-250 was injected into the epidural space and the flow of contrast was observed. Radiographs were obtained for documentation purposes.    The injectate was administered into the level noted above.   Additional Comments:  The patient tolerated the procedure well Dressing: 2 x 2 sterile gauze and Band-Aid    Post-procedure details: Patient was observed during the procedure. Post-procedure instructions were reviewed.  Patient left the clinic in stable condition.    Clinical History: MRI LUMBAR SPINE WITHOUT CONTRAST  TECHNIQUE: Multiplanar, multisequence MR imaging of the lumbar spine was performed. No intravenous contrast was administered.  COMPARISON: 11/05/2012  FINDINGS: Segmentation: 5 lumbar type vertebral bodies assumed.  Alignment: No malalignment.  Vertebrae: No fracture or primary bone lesion.  Conus medullaris and cauda equina: Conus extends to the L1 level. Conus and cauda equina appear normal.  Paraspinal and other soft tissues: Simple appearing renal cysts.  Disc levels:  Degenerative disc disease at  T9-10 and T10-11 with disc bulges but no apparent compressive stenosis. Those levels were not studied in detail.  T12-L1 and L1-2 are  normal.  L2-3: Mild bulging of the disc. Facet degeneration with ligamentous hypertrophy and joint effusions. Mild narrowing of the canal but no compressive stenosis in this position. This appearance could worsen with standing or flexion, based on the morphology of the facet arthropathy. This has worsened since 2014.  L3-4: Previous PLIF is solid with wide patency of the canal and foramina.  L4-5: Previous left hemilaminectomy. Endplate osteophytes and mild bulging of the disc. No compressive stenosis.  L5-S1: Central disc protrusion contacts the thecal sac in the S1 root sleeves as they bud from the thecal sac, though definite neural compression is not demonstrated. Foramina appear sufficiently patent. Mild bilateral facet osteoarthritis. Similar appearance to the study of 2014.  IMPRESSION: Since 2014, there has been worsening of adjacent segment degenerative disease at L2-3. The disc bulges mildly. There is bilateral facet arthropathy with ligamentous hypertrophy and fluid-filled joints. There is mild stenosis of the canal in this position, but this appearance would likely worsen with standing or flexion.  Good appearance at the previous discectomy and fusion level of L3-4.  Satisfactory appearance at the previous surgical level of L4-5 with previous left hemilaminectomy. Mild degenerative changes without compressive stenosis.  L5-S1 shows a chronic central disc protrusion and mild facet degeneration but no apparent compressive stenosis or change since,2014.   Electronically Signed By: Nelson Chimes M.D. On: 09/28/2017 07:00     Objective:  VS:  HT:    WT:   BMI:     BP:(!) 158/88  HR:67bpm  TEMP: ( )  RESP:  Physical Exam  Ortho Exam Imaging: No results found.

## 2019-06-07 NOTE — Procedures (Signed)
Lumbar Epidural Steroid Injection - Interlaminar Approach with Fluoroscopic Guidance  Patient: Tiffany [name]      Date of Birth: March 25, 1950 MRN: [id number] PCP: Nolene Ebbs, MD      Visit Date: 02/18/2019   Universal Protocol:     Consent Given By: the patient  Position: PRONE  Additional Comments: Vital signs were monitored before and after the procedure. Patient was prepped and draped in the usual sterile fashion. The correct patient, procedure, and site was verified.   Injection Procedure Details:  Procedure Site One Meds Administered:  Meds ordered this encounter  Medications  . methylPREDNISolone acetate (DEPO-MEDROL) injection 80 mg     Laterality: Right  Location/Site:  L5-S1  Needle size: 20 G  Needle type: Tuohy  Needle Placement: Paramedian epidural  Findings:   -Comments: Excellent flow of contrast into the epidural space.  Procedure Details: Using a paramedian approach from the side mentioned above, the region overlying the inferior lamina was localized under fluoroscopic visualization and the soft tissues overlying this structure were infiltrated with 4 ml. of 1% Lidocaine without Epinephrine. The Tuohy needle was inserted into the epidural space using a paramedian approach.   The epidural space was localized using loss of resistance along with lateral and bi-planar fluoroscopic views.  After negative aspirate for air, blood, and CSF, a 2 ml. volume of Isovue-250 was injected into the epidural space and the flow of contrast was observed. Radiographs were obtained for documentation purposes.    The injectate was administered into the level noted above.   Additional Comments:  The patient tolerated the procedure well Dressing: 2 x 2 sterile gauze and Band-Aid    Post-procedure details: Patient was observed during the procedure. Post-procedure instructions were reviewed.  Patient left the clinic in stable condition.

## 2019-06-11 ENCOUNTER — Ambulatory Visit (HOSPITAL_COMMUNITY)
Admission: RE | Admit: 2019-06-11 | Discharge: 2019-06-11 | Disposition: A | Discharge status: Home / Self Care | Payer: Medicare Other | Source: Ambulatory Visit | Attending: Internal Medicine | Admitting: Internal Medicine

## 2019-06-11 ENCOUNTER — Other Ambulatory Visit: Payer: Self-pay

## 2019-06-11 VITALS — BP 158/80 | HR 64 | Temp 96.8°F | Resp 18

## 2019-06-11 DIAGNOSIS — N184 Chronic kidney disease, stage 4 (severe): Secondary | ICD-10-CM | POA: Diagnosis not present

## 2019-06-11 DIAGNOSIS — D631 Anemia in chronic kidney disease: Secondary | ICD-10-CM | POA: Diagnosis not present

## 2019-06-11 DIAGNOSIS — N183 Chronic kidney disease, stage 3 unspecified: Secondary | ICD-10-CM

## 2019-06-11 LAB — IRON AND TIBC
Iron: 69 ug/dL (ref 28–170)
Saturation Ratios: 23 % (ref 10.4–31.8)
TIBC: 302 ug/dL (ref 250–450)
UIBC: 233 ug/dL

## 2019-06-11 LAB — FERRITIN: Ferritin: 582 ng/mL — ABNORMAL HIGH (ref 11–307)

## 2019-06-11 LAB — POCT HEMOGLOBIN-HEMACUE: Hemoglobin: 9.8 g/dL — ABNORMAL LOW (ref 12.0–15.0)

## 2019-06-11 MED ORDER — EPOETIN ALFA-EPBX 3000 UNIT/ML IJ SOLN
INTRAMUSCULAR | Status: AC
  Administered 2019-06-11: 3000 [IU]
  Filled 2019-06-11: qty 1

## 2019-06-11 MED ORDER — EPOETIN ALFA-EPBX 10000 UNIT/ML IJ SOLN: 15000.0000 [IU] | INTRAMUSCULAR | Status: DC

## 2019-06-11 MED ORDER — EPOETIN ALFA-EPBX 10000 UNIT/ML IJ SOLN
INTRAMUSCULAR | Status: AC
  Administered 2019-06-11: 10000 [IU] via SUBCUTANEOUS
  Filled 2019-06-11: qty 1

## 2019-06-11 MED ORDER — EPOETIN ALFA-EPBX 2000 UNIT/ML IJ SOLN
INTRAMUSCULAR | Status: AC
  Administered 2019-06-11: 2000 [IU]
  Filled 2019-06-11: qty 1

## 2019-06-20 NOTE — H&P (Signed)
TOTAL KNEE ADMISSION H&P  Patient is being admitted for left total knee arthroplasty.  Subjective:  Chief Complaint:    Left knee primary OA / pain  HPI: Tiffany [name], 69 y.o. female, has a history of pain and functional disability in the left knee due to arthritis and has failed non-surgical conservative treatments for greater than 12 weeks to include NSAID's and/or analgesics, corticosteriod injections, viscosupplementation injections, use of assistive devices and activity modification.  Onset of symptoms was gradual, starting  years ago with gradually worsening course since that time. The patient noted prior procedures on the knee to include  arthroscopy on the left knee(s).  Patient currently rates pain in the left knee(s) at 10 out of 10 with activity. Patient has night pain, worsening of pain with activity and weight bearing, pain that interferes with activities of daily living, pain with passive range of motion, crepitus and joint swelling.  Patient has evidence of periarticular osteophytes and joint space narrowing by imaging studies.  There is no active infection.   Risks, benefits and expectations were discussed with the patient.  Risks including but not limited to the risk of anesthesia, blood clots, nerve damage, blood vessel damage, failure of the prosthesis, infection and up to and including death.  Patient understand the risks, benefits and expectations and wishes to proceed with surgery.   PCP: Nolene Ebbs, MD  D/C Plans:       Home   Post-op Meds:       No Rx given   Tranexamic Acid:      To be given - IV   Decadron:      Is to be given  FYI:      ASA  Norco  DME:    Rx sent for - RW & 3-n-1  PT:   OPPT @ [facility]  Pharmacy: [facility] [place]    Patient Active Problem List   Diagnosis Date Noted  . Hyperlipidemia 09/12/2016  . Type 2 diabetes mellitus without complications ([place]) 99/24/2683  . Tachycardia 08/26/2016  . Palpitation 08/26/2016  . SOB (shortness of  breath) 08/26/2016  . CKD (chronic kidney disease), stage III 04/28/2014  . Chronic diastolic heart failure ([place]) 05/05/2013  . Essential hypertension 05/05/2013  . HEART MURMUR, SYSTOLIC 41/96/2229  . UNSPECIFIED HYPOTHYROIDISM 02/12/2007  . OBESITY 01/08/2007  . Obstructive sleep apnea 01/08/2007  . Seasonal and perennial allergic rhinitis 01/08/2007   Past Medical History:  Diagnosis Date  . ALLERGIC RHINITIS   . Chronic diastolic heart failure ([place]) 05/05/2013   Echo 03/2018:  EF 79-89, +diastolic dysfunction, GLS -20.5%, normal RVSF, RVSP 42.5 (mild elevated), severe LAE, small pericardial eff, mild MAc, mod AV calc, mild PI  . Diabetes mellitus   . Hypertension   . Hypothyroid   . Kidney disease   . Obesity   . Previous back surgery    x 2  . S/P arthroscopy   . Sleep apnea     Past Surgical History:  Procedure Laterality Date  . BREAST BIOPSY  20+ yrs ago   dont know which breast per pt; benign  . BREAST REDUCTION SURGERY  1982  . KNEE SURGERY     x 2  . LUMBAR DISC SURGERY     x 2  . REDUCTION MAMMAPLASTY Bilateral 1985  . TOE SURGERY    . TOTAL ABDOMINAL HYSTERECTOMY  1997   partial hysterectomy- still has ovaries  . TREATMENT FISTULA ANAL      No current facility-administered medications  for this encounter.   Current Outpatient Medications  Medication Sig Dispense Refill Last Dose  . acetaminophen (TYLENOL) 500 MG tablet Take 500-1,000 mg by mouth every 6 (six) hours as needed (for pain.).     Marland Kitchen albuterol (VENTOLIN HFA) 108 (90 Base) MCG/ACT inhaler Inhale 1-2 puffs into the lungs every 6 (six) hours as needed for wheezing or shortness of breath.     . allopurinol (ZYLOPRIM) 100 MG tablet Take 100 mg by mouth 2 (two) times daily.  1   . amLODipine (NORVASC) 10 MG tablet Take 10 mg by mouth 2 (two) times daily.      Marland Kitchen aspirin EC 81 MG tablet Take 81 mg by mouth at bedtime.     . B Complex Vitamins (B COMPLEX PO) Take 1 tablet by mouth daily.     Marland Kitchen BIOTIN 5000  PO Take 5,000 mcg by mouth daily.      . calcitRIOL (ROCALTROL) 0.25 MCG capsule Take 0.25 mcg by mouth daily at 6 PM. (1700)     . carvedilol (COREG) 6.25 MG tablet Take 6.25 mg by mouth in the morning and at bedtime.     . cetirizine (ZYRTEC) 10 MG tablet Take 10 mg by mouth daily as needed for allergies.     . cloNIDine (CATAPRES - DOSED IN MG/24 HR) 0.3 mg/24hr Place 0.3 mg onto the skin every Friday.      . diclofenac sodium (VOLTAREN) 1 % GEL Apply 2 g topically 4 (four) times daily as needed (pain.).      Marland Kitchen insulin glargine (LANTUS) 100 UNIT/ML injection Inject 15 Units into the skin at bedtime.      Marland Kitchen levothyroxine (LEVO-T) 112 MCG tablet Take 224-336 mcg by mouth See admin instructions. Take 3 tablets (336 mcg) by mouth on Sundays at 2200 & take 2 tablets (224 mcg) by mouth on Mondays, Tuesdays, Wednesdays, Thursdays, Fridays, & Saturdays at 2200.     . Multiple Vitamins-Minerals (PRESERVISION AREDS PO) Take 1 tablet by mouth in the morning and at bedtime.     . Omega-3 Fatty Acids (FISH OIL) 500 MG CAPS Take 500 mg by mouth daily in the afternoon.     . rosuvastatin (CRESTOR) 10 MG tablet Take 10 mg by mouth every evening.      Marland Kitchen SYSTANE COMPLETE 0.6 % SOLN Place 1 drop into both eyes in the morning, at noon, in the evening, and at bedtime.     . torsemide (DEMADEX) 100 MG tablet Take 100-150 mg by mouth See admin instructions. Alternating between 1 tablet (100 mg) & 1.5 tablets (150 mg) every other day     . zolpidem (AMBIEN) 10 MG tablet TAKE 1 TABLET BY MOUTH EVERY DAY AT BEDTIME AS NEEDED FOR SLEEP (Patient taking differently: Take 10 mg by mouth at bedtime as needed for sleep. ) 30 tablet 5   . B-D INS SYRINGE 0.5CC/30GX1/2" 30G X 1/2" 0.5 ML MISC TAKE AS DIRECTED TWICE A DAY  3    No Known Allergies  Social History   Tobacco Use  . Smoking status: Never Smoker  . Smokeless tobacco: Never Used  Substance Use Topics  . Alcohol use: Yes    Alcohol/week: 0.0 standard drinks     Comment: rare    Family History  Problem Relation Age of Onset  . Prostate cancer Father   . Congestive Heart Failure Mother   . Diabetes Sister   . Other Sister        septis  .  Breast cancer Neg Hx      Review of Systems  Constitutional: Negative.   HENT: Negative.   Eyes: Negative.   Respiratory: Negative.   Cardiovascular: Negative.   Gastrointestinal: Negative.   Genitourinary: Negative.   Musculoskeletal: Positive for joint pain.  Skin: Negative.   Neurological: Negative.   Endo/Heme/Allergies: Positive for environmental allergies.  Psychiatric/Behavioral: The patient has insomnia.       Objective:  Physical Exam  Constitutional: She is oriented to person, place, and time. She appears well-developed.  HENT:  Head: Normocephalic.  Eyes: Pupils are equal, round, and reactive to light.  Neck: No JVD present. No tracheal deviation present. No thyromegaly present.  Cardiovascular: Normal rate, regular rhythm and intact distal pulses.  Respiratory: Effort normal and breath sounds normal. No respiratory distress. She has no wheezes.  GI: Soft. There is no abdominal tenderness. There is no guarding.  Musculoskeletal:     Cervical back: Neck supple.     Left knee: Swelling and bony tenderness present. No deformity, erythema, ecchymosis or lacerations. Tenderness present.  Lymphadenopathy:    She has no cervical adenopathy.  Neurological: She is alert and oriented to person, place, and time.  Skin: Skin is warm and dry.  Psychiatric: She has a normal mood and affect.     Labs:  Estimated body mass index is 37.59 kg/m as calculated from the following:   Height as of 05/12/19: _0  (1.778 m).   Weight as of 05/12/19: 118.8 kg.   Imaging Review Plain radiographs demonstrate severe degenerative joint disease of the left knee.  The bone quality appears to be good for age and reported activity level.      Assessment/Plan:  End stage arthritis, left knee   The  patient history, physical examination, clinical judgment of the provider and imaging studies are consistent with end stage degenerative joint disease of the left knee(s) and total knee arthroplasty is deemed medically necessary. The treatment options including medical management, injection therapy arthroscopy and arthroplasty were discussed at length. The risks and benefits of total knee arthroplasty were presented and reviewed. The risks due to aseptic loosening, infection, stiffness, patella tracking problems, thromboembolic complications and other imponderables were discussed. The patient acknowledged the explanation, agreed to proceed with the plan and consent was signed. Patient is being admitted for inpatient treatment for surgery, pain control, PT, OT, prophylactic antibiotics, VTE prophylaxis, progressive ambulation and ADL's and discharge planning. The patient is planning to be discharged home with home health services     Patient's anticipated LOS is less than 2 midnights, meeting these requirements: - Lives within 1 hour of care - Has a competent adult at home to recover with post-op recover - NO history of  - Chronic pain requiring opiods  - Coronary Artery Disease  - Heart attack  - Stroke  - DVT/VTE  - Cardiac arrhythmia  - Respiratory Failure/COPD  - Anemia  - Advanced Liver disease    West Pugh. Leenah Seidner   PA-C  06/20/2019, 5:33 PM

## 2019-06-21 ENCOUNTER — Encounter (HOSPITAL_COMMUNITY): Payer: Self-pay

## 2019-06-21 NOTE — Patient Instructions (Addendum)
DUE TO COVID-19 ONLY ONE VISITOR ARE ALLOWED TO COME WITH YOU AND STAY IN THE WAITING ROOM ONLY DURING PRE OP AND PROCEDURE. THEN TWO VISITORS MAY VISIT WITH YOU IN YOUR PRIVATE ROOM DURING VISITING HOURS ONLY!!   COVID SWAB TESTING MUST BE COMPLETED ON:  Monday, Jun 28, 2019 [facility] [place] (Must self quarantine after testing. Follow instructions on handout.)             Your procedure is scheduled on: Thursday, Jul 02, 2019   Report to [facility] Main  Entrance   Report to Short Stay at 5:30 AM   [facility])   Call this number if you have problems the morning of surgery [phone]   Bring CPAP mask and tubing day of surgery   Do not eat food :After Midnight.   May have liquids until 4:30 AM day of surgery   CLEAR LIQUID DIET  Foods Allowed                                                                     Foods Excluded  Water, Black Coffee and tea, regular and decaf                             liquids that you cannot  Plain Jell-O in any flavor  (No red)                                           see through such as: Fruit ices (not with fruit pulp)                                     milk, soups, orange juice  Iced Popsicles (No red)                                    All solid food Carbonated beverages, regular and diet                                    Apple juices Sports drinks like Gatorade (No red) Lightly seasoned clear broth or consume(fat free) Sugar, honey syrup  Sample Menu Breakfast                                Lunch                                     Supper Cranberry juice                    Beef broth  Chicken broth Jell-O                                     Grape juice                           Apple juice Coffee or tea                        Jell-O                                      Popsicle                                                 Coffee or tea                        Coffee or tea   Complete one G2 drink the morning of surgery at 4:30AM the day of surgery.   Oral Hygiene is also important to reduce your risk of infection.                                    Remember - BRUSH YOUR TEETH THE MORNING OF SURGERY WITH YOUR REGULAR TOOTHPASTE   Do NOT smoke after Midnight   Take these medicines the morning of surgery with A SIP OF WATER: Allopurinol, Amlodipine, Carvedilol, Levothyroxine,    May use eyedrops morning of surgery  ONLY TAKE 9 UNITS OF LANTUS INSULIN DOSE THE NIGHT BEFORE SURGERY  DO NOT TAKE ANY ORAL DIABETIC MEDICATIONS DAY OF YOUR SURGERY  BRING ASTHMA INHALER DAY OF SURGERY                               You may not have any metal on your body including hair pins, jewelry, and body piercings             Do not wear make-up, lotions, powders, perfumes/cologne, or deodorant             Do not wear nail polish.  Do not shave  48 hours prior to surgery.               Do not bring valuables to the hospital. [place].   Contacts, dentures or bridgework may not be worn into surgery.   Bring small overnight bag day of surgery.    Patients discharged the day of surgery will not be allowed to drive home.   Special Instructions: Bring a copy of your healthcare power of attorney and living will documents         the day of surgery if you haven't scanned them in before.              Please read over the following fact sheets you were given: IF YOU HAVE [place] [phone]  How  to Manage Your Diabetes Before and After Surgery  Why is it important to control my blood sugar before and after surgery? . Improving blood sugar levels before and after surgery helps healing and can limit problems. . A way of improving blood sugar control is eating a healthy diet by: o  Eating less sugar and carbohydrates o   Increasing activity/exercise o  Talking with your doctor about reaching your blood sugar goals . High blood sugars (greater than 180 mg/dL) can raise your risk of infections and slow your recovery, so you will need to focus on controlling your diabetes during the weeks before surgery. . Make sure that the doctor who takes care of your diabetes knows about your planned surgery including the date and location.  How do I manage my blood sugar before surgery? . Check your blood sugar at least 4 times a day, starting 2 days before surgery, to make sure that the level is not too high or low. o Check your blood sugar the morning of your surgery when you wake up and every 2 hours until you get to the Short Stay unit. . If your blood sugar is less than 70 mg/dL, you will need to treat for low blood sugar: o Do not take insulin. o Treat a low blood sugar (less than 70 mg/dL) with  cup of clear juice (cranberry or apple), 4 glucose tablets, OR glucose gel. o Recheck blood sugar in 15 minutes after treatment (to make sure it is greater than 70 mg/dL). If your blood sugar is not greater than 70 mg/dL on recheck, call [phone] for further instructions. . Report your blood sugar to the short stay nurse when you get to Short Stay.  . If you are admitted to the hospital after surgery: o Your blood sugar will be checked by the staff and you will probably be given insulin after surgery (instead of oral diabetes medicines) to make sure you have good blood sugar levels. o The goal for blood sugar control after surgery is 80-180 mg/dL.   WHAT DO I DO ABOUT MY DIABETES MEDICATION?  Marland Kitchen Do not take oral diabetes medicines (pills) the morning of surgery.  . THE NIGHT BEFORE SURGERY, take 9  units of    LANTUS   insulin.      Reviewed and Endorsed by [facility] Patient Education Committee, August 2015   [facility] - Preparing for Surgery Before surgery, you can play an important role.  Because skin is not  sterile, your skin needs to be as free of germs as possible.  You can reduce the number of germs on your skin by washing with CHG (chlorahexidine gluconate) soap before surgery.  CHG is an antiseptic cleaner which kills germs and bonds with the skin to continue killing germs even after washing. Please DO NOT use if you have an allergy to CHG or antibacterial soaps.  If your skin becomes reddened/irritated stop using the CHG and inform your nurse when you arrive at Short Stay. Do not shave (including legs and underarms) for at least 48 hours prior to the first CHG shower.  You may shave your face/neck.  Please follow these instructions carefully:  1.  Shower with CHG Soap the night before surgery and the  morning of surgery.  2.  If you choose to wash your hair, wash your hair first as usual with your normal  shampoo.  3.  After you shampoo, rinse your hair and body thoroughly to remove the shampoo.  4.  Use CHG as you would any other liquid soap.  You can apply chg directly to the skin and wash.  Gently with a scrungie or clean washcloth.  5.  Apply the CHG Soap to your body ONLY FROM THE NECK DOWN.   Do   not use on face/ open                           Wound or open sores. Avoid contact with eyes, ears mouth and   genitals (private parts).                       Wash face,  Genitals (private parts) with your normal soap.             6.  Wash thoroughly, paying special attention to the area where your    surgery  will be performed.  7.  Thoroughly rinse your body with warm water from the neck down.  8.  DO NOT shower/wash with your normal soap after using and rinsing off the CHG Soap.                9.  Pat yourself dry with a clean towel.            10.  Wear clean pajamas.            11.  Place clean sheets on your bed the night of your first shower and do not  sleep with pets. Day of Surgery : Do not apply any lotions/deodorants the morning of surgery.  Please wear  clean clothes to the hospital/surgery center.  FAILURE TO FOLLOW THESE INSTRUCTIONS MAY RESULT IN THE CANCELLATION OF YOUR SURGERY  PATIENT SIGNATURE_________________________________  NURSE SIGNATURE__________________________________  ________________________________________________________________________   Adam Phenix  An incentive spirometer is a tool that can help keep your lungs clear and active. This tool measures how well you are filling your lungs with each breath. Taking long deep breaths may help reverse or decrease the chance of developing breathing (pulmonary) problems (especially infection) following:  A long period of time when you are unable to move or be active. BEFORE THE PROCEDURE   If the spirometer includes an indicator to show your best effort, your nurse or respiratory therapist will set it to a desired goal.  If possible, sit up straight or lean slightly forward. Try not to slouch.  Hold the incentive spirometer in an upright position. INSTRUCTIONS FOR USE  1. Sit on the edge of your bed if possible, or sit up as far as you can in bed or on a chair. 2. Hold the incentive spirometer in an upright position. 3. Breathe out normally. 4. Place the mouthpiece in your mouth and seal your lips tightly around it. 5. Breathe in slowly and as deeply as possible, raising the piston or the ball toward the top of the column. 6. Hold your breath for 3-5 seconds or for as long as possible. Allow the piston or ball to fall to the bottom of the column. 7. Remove the mouthpiece from your mouth and breathe out normally. 8. Rest for a few seconds and repeat Steps 1 through 7 at least 10 times every 1-2 hours when you are awake. Take your time and take a few normal breaths between deep breaths. 9. The spirometer may include an indicator to show your best effort. Use the indicator as a goal to work toward during each repetition.  10. After each set of 10 deep breaths,  practice coughing to be sure your lungs are clear. If you have an incision (the cut made at the time of surgery), support your incision when coughing by placing a pillow or rolled up towels firmly against it. Once you are able to get out of bed, walk around indoors and cough well. You may stop using the incentive spirometer when instructed by your caregiver.  RISKS AND COMPLICATIONS  Take your time so you do not get dizzy or light-headed.  If you are in pain, you may need to take or ask for pain medication before doing incentive spirometry. It is harder to take a deep breath if you are having pain. AFTER USE  Rest and breathe slowly and easily.  It can be helpful to keep track of a log of your progress. Your caregiver can provide you with a simple table to help with this. If you are using the spirometer at home, follow these instructions: [place] IF:   You are having difficultly using the spirometer.  You have trouble using the spirometer as often as instructed.  Your pain medication is not giving enough relief while using the spirometer.  You develop fever of 100.5 F (38.1 C) or higher. SEEK IMMEDIATE MEDICAL CARE IF:   You cough up bloody sputum that had not been present before.  You develop fever of 102 F (38.9 C) or greater.  You develop worsening pain at or near the incision site. MAKE SURE YOU:   Understand these instructions.  Will watch your condition.  Will get help right away if you are not doing well or get worse. Document Released: 06/03/2006 Document Revised: 04/15/2011 Document Reviewed: 08/04/2006 ExitCare Patient Information 2014 ExitCare, [place].   ________________________________________________________________________  WHAT IS A BLOOD TRANSFUSION? Blood Transfusion Information  A transfusion is the replacement of blood or some of its parts. Blood is made up of multiple cells which provide different functions.  Red blood cells carry oxygen  and are used for blood loss replacement.  White blood cells fight against infection.  Platelets control bleeding.  Plasma helps clot blood.  Other blood products are available for specialized needs, such as hemophilia or other clotting disorders. BEFORE THE TRANSFUSION  Who gives blood for transfusions?   Healthy volunteers who are fully evaluated to make sure their blood is safe. This is blood bank blood. Transfusion therapy is the safest it has ever been in the practice of medicine. Before blood is taken from a donor, a complete history is taken to make sure that person has no history of diseases nor engages in risky social behavior (examples are intravenous drug use or sexual activity with multiple partners). The donor's travel history is screened to minimize risk of transmitting infections, such as malaria. The donated blood is tested for signs of infectious diseases, such as HIV and hepatitis. The blood is then tested to be sure it is compatible with you in order to minimize the chance of a transfusion reaction. If you or a relative donates blood, this is often done in anticipation of surgery and is not appropriate for emergency situations. It takes many days to process the donated blood. RISKS AND COMPLICATIONS Although transfusion therapy is very safe and saves many lives, the main dangers of transfusion include:   Getting an infectious disease.  Developing a transfusion reaction. This is an allergic reaction to something in the blood you were given. Every precaution is taken to prevent this. The  decision to have a blood transfusion has been considered carefully by your caregiver before blood is given. Blood is not given unless the benefits outweigh the risks. AFTER THE TRANSFUSION  Right after receiving a blood transfusion, you will usually feel much better and more energetic. This is especially true if your red blood cells have gotten low (anemic). The transfusion raises the level of  the red blood cells which carry oxygen, and this usually causes an energy increase.  The nurse administering the transfusion will monitor you carefully for complications. HOME CARE INSTRUCTIONS  No special instructions are needed after a transfusion. You may find your energy is better. Speak with your caregiver about any limitations on activity for underlying diseases you may have. SEEK MEDICAL CARE IF:   Your condition is not improving after your transfusion.  You develop redness or irritation at the intravenous (IV) site. SEEK IMMEDIATE MEDICAL CARE IF:  Any of the following symptoms occur over the next 12 hours:  Shaking chills.  You have a temperature by mouth above 102 F (38.9 C), not controlled by medicine.  Chest, back, or muscle pain.  People around you feel you are not acting correctly or are confused.  Shortness of breath or difficulty breathing.  Dizziness and fainting.  You get a rash or develop hives.  You have a decrease in urine output.  Your urine turns a dark color or changes to pink, red, or brown. Any of the following symptoms occur over the next 10 days:  You have a temperature by mouth above 102 F (38.9 C), not controlled by medicine.  Shortness of breath.  Weakness after normal activity.  The white part of the eye turns yellow (jaundice).  You have a decrease in the amount of urine or are urinating less often.  Your urine turns a dark color or changes to pink, red, or brown. Document Released: 01/19/2000 Document Revised: 04/15/2011 Document Reviewed: 09/07/2007 [facility] Patient Information 2014 [place], [place].  _______________________________________________________________________

## 2019-06-23 ENCOUNTER — Encounter (HOSPITAL_COMMUNITY)
Admission: RE | Admit: 2019-06-23 | Discharge: 2019-06-23 | Disposition: A | Discharge status: Home / Self Care | Payer: Medicare Other | Source: Ambulatory Visit | Attending: Orthopedic Surgery | Admitting: Orthopedic Surgery

## 2019-06-23 ENCOUNTER — Encounter (HOSPITAL_COMMUNITY): Payer: Self-pay

## 2019-06-23 ENCOUNTER — Other Ambulatory Visit: Payer: Self-pay

## 2019-06-23 DIAGNOSIS — Z01812 Encounter for preprocedural laboratory examination: Secondary | ICD-10-CM | POA: Insufficient documentation

## 2019-06-23 HISTORY — DX: Cardiac murmur, unspecified: R01.1

## 2019-06-23 HISTORY — DX: Iron deficiency anemia, unspecified: D50.9

## 2019-06-23 HISTORY — DX: Chronic kidney disease, unspecified: N18.9

## 2019-06-23 HISTORY — DX: Hyperlipidemia, unspecified: E78.5

## 2019-06-23 HISTORY — DX: Unspecified osteoarthritis, unspecified site: M19.90

## 2019-06-23 HISTORY — DX: Obstructive sleep apnea (adult) (pediatric): G47.33

## 2019-06-23 HISTORY — DX: Dependence on other enabling machines and devices: Z99.89

## 2019-06-23 LAB — BASIC METABOLIC PANEL
Anion gap: 14 (ref 5–15)
BUN: 102 mg/dL — ABNORMAL HIGH (ref 8–23)
CO2: 21 mmol/L — ABNORMAL LOW (ref 22–32)
Calcium: 9.2 mg/dL (ref 8.9–10.3)
Chloride: 108 mmol/L (ref 98–111)
Creatinine, Ser: 3.09 mg/dL — ABNORMAL HIGH (ref 0.44–1.00)
GFR calc Af Amer: 17 mL/min — ABNORMAL LOW (ref >60)
GFR calc non Af Amer: 15 mL/min — ABNORMAL LOW (ref >60)
Glucose, Bld: 77 mg/dL (ref 70–99)
Potassium: 4.3 mmol/L (ref 3.5–5.1)
Sodium: 143 mmol/L (ref 135–145)

## 2019-06-23 LAB — CBC
HCT: 30.6 % — ABNORMAL LOW (ref 36.0–46.0)
Hemoglobin: 9.3 g/dL — ABNORMAL LOW (ref 12.0–15.0)
MCH: 26.1 pg (ref 26.0–34.0)
MCHC: 30.4 g/dL (ref 30.0–36.0)
MCV: 86 fL (ref 80.0–100.0)
Platelets: 159 10*3/uL (ref 150–400)
RBC: 3.56 MIL/uL — ABNORMAL LOW (ref 3.87–5.11)
RDW: 18.6 % — ABNORMAL HIGH (ref 11.5–15.5)
WBC: 5.4 10*3/uL (ref 4.0–10.5)
nRBC: 0 % (ref 0.0–0.2)

## 2019-06-23 LAB — SURGICAL PCR SCREEN
MRSA, PCR: NEGATIVE
Staphylococcus aureus: POSITIVE — AB

## 2019-06-23 LAB — GLUCOSE, CAPILLARY: Glucose-Capillary: 69 mg/dL — ABNORMAL LOW (ref 70–99)

## 2019-06-23 NOTE — Progress Notes (Signed)
PCP - Dr. Revonda Humphrey Cardiologist - Dr. Linard Millers, Jonelle Sidle last office visit and clearance 05/12/19 in epic  Chest x-ray - greater than 1 year EKG - 05/12/19 in epic Stress Test - greater than 2 years ECHO - 03/11/18 in epic Cardiac Cath - N/A  Sleep Study - Yes CPAP - Yes  Fasting Blood Sugar - 80's-110 Checks Blood Sugar ___1__ times a day  Blood Thinner Instructions: N/A Aspirin Instructions: Yes Last Dose: 06/18/19  Anesthesia review: CHF, OSA, DM, HTN  Patient denies shortness of breath, fever, cough and chest pain at PAT appointment   Patient verbalized understanding of instructions that were given to them at the PAT appointment. Patient was also instructed that they will need to review over the PAT instructions again at home before surgery.

## 2019-06-23 NOTE — Progress Notes (Addendum)
PCR, CBC, BMP results 06/23/2019 faxed to Dr. Alvan Dame via epic.

## 2019-06-24 LAB — HEMOGLOBIN A1C
Hgb A1c MFr Bld: 6.3 % — ABNORMAL HIGH (ref 4.8–5.6)
Mean Plasma Glucose: 134 mg/dL

## 2019-06-25 ENCOUNTER — Other Ambulatory Visit: Payer: Self-pay

## 2019-06-25 ENCOUNTER — Ambulatory Visit (HOSPITAL_COMMUNITY)
Admission: RE | Admit: 2019-06-25 | Discharge: 2019-06-25 | Disposition: A | Discharge status: Home / Self Care | Payer: Medicare Other | Source: Ambulatory Visit | Attending: Internal Medicine | Admitting: Internal Medicine

## 2019-06-25 VITALS — BP 151/70 | HR 56 | Temp 96.3°F | Resp 18

## 2019-06-25 DIAGNOSIS — N183 Chronic kidney disease, stage 3 unspecified: Secondary | ICD-10-CM | POA: Diagnosis not present

## 2019-06-25 LAB — POCT HEMOGLOBIN-HEMACUE: Hemoglobin: 9.5 g/dL — ABNORMAL LOW (ref 12.0–15.0)

## 2019-06-25 MED ORDER — EPOETIN ALFA-EPBX 10000 UNIT/ML IJ SOLN: 15000.0000 [IU] | INTRAMUSCULAR | Status: DC

## 2019-06-25 MED ORDER — EPOETIN ALFA-EPBX 2000 UNIT/ML IJ SOLN
INTRAMUSCULAR | Status: AC
  Administered 2019-06-25: 2000 [IU] via SUBCUTANEOUS
  Filled 2019-06-25: qty 1

## 2019-06-25 MED ORDER — EPOETIN ALFA-EPBX 10000 UNIT/ML IJ SOLN
INTRAMUSCULAR | Status: AC
  Administered 2019-06-25: 10000 [IU] via SUBCUTANEOUS
  Filled 2019-06-25: qty 1

## 2019-06-25 MED ORDER — EPOETIN ALFA-EPBX 3000 UNIT/ML IJ SOLN
INTRAMUSCULAR | Status: AC
  Administered 2019-06-25: 3000 [IU] via SUBCUTANEOUS
  Filled 2019-06-25: qty 1

## 2019-06-25 NOTE — Progress Notes (Signed)
Anesthesia Chart Review   Case: [id number] Date/Time: 07/01/19 0715   Procedure: TOTAL KNEE ARTHROPLASTY (Left Knee) - 70 min NO BLOOD PRODUCTS!!   Anesthesia type: Spinal   Pre-op diagnosis: Left knee osteoarthritis   Location: [facility] ROOM 09 / [facility]   Surgeons: Paralee Cancel, MD      DISCUSSION:69 y.o. never smoker with h/o HTN, DM II, CKD (creatinine stable), OSA on CPAP, chronic diastolic heart failure, left knee OA scheduled for above procedure 07/01/2019 with Dr. Paralee Cancel.   Pt last seen by cardiology 05/12/2019.  Per OV note, "Given past medical history and time since last visit, based on ACC/AHA guidelines, Tiffany [name] would be at acceptable risk for the planned procedure without further cardiovascular testing.  Okay to hold aspirin for 5 to 7 days prior to surgery.  Restart per direction of surgeon."  Anticipate pt can proceed with planned procedure barring acute status change.   VS: BP (!) 175/77   Pulse 64   Temp 37 C (Oral)   Resp 18   Ht _0  (1.778 m)   Wt 123.1 kg   SpO2 100%   BMI 38.94 kg/m   PROVIDERS: Nolene Ebbs, MD is PCP   Daneen Schick, MD is Cardiologist  LABS: Surgeon made aware of hemoglobin (all labs ordered are listed, but only abnormal results are displayed)  Labs Reviewed  SURGICAL PCR SCREEN - Abnormal; Notable for the following components:      Result Value   Staphylococcus aureus POSITIVE (*)    All other components within normal limits  BASIC METABOLIC PANEL - Abnormal; Notable for the following components:   CO2 21 (*)    BUN 102 (*)    Creatinine, Ser 3.09 (*)    GFR calc non Af Amer 15 (*)    GFR calc Af Amer 17 (*)    All other components within normal limits  CBC - Abnormal; Notable for the following components:   RBC 3.56 (*)    Hemoglobin 9.3 (*)    HCT 30.6 (*)    RDW 18.6 (*)    All other components within normal limits  HEMOGLOBIN A1C - Abnormal; Notable for the following components:   Hgb A1c MFr Bld 6.3 (*)     All other components within normal limits  GLUCOSE, CAPILLARY - Abnormal; Notable for the following components:   Glucose-Capillary 69 (*)    All other components within normal limits     IMAGES:   EKG: 05/12/2019 Rate 60 bpm Sinus rhythm  PAC  CV: Echo 03/11/2018 IMPRESSIONS    1. The left ventricle has normal systolic function of 22-63%. The cavity  size is normal. There is no increased left ventricular wall thickness.  Echo evidence of impaired diastolic relaxation Elevated left ventricular  end-diastolic pressure.  2. Global longitudinal strain -20.5% (normal).  3. The right ventricle has normal systolic function. The cavity in normal  in size. There is no increase in right ventricular wall thickness. Right  ventricular systolic pressure is mildly elevated with an estimated  pressure of 42.5 mmHg.  4. Severely dilated left atrial size.  5. Small pericardial effusion.  6. The pericardial effusion is posterior to the left ventricle.  7. The mitral valve is normal in structure There is mild mitral annular  calcification present.  8. The aortic valve is tricuspid. There is moderate thickening and  moderate calcification of the aortic valve.  9. The pulmonic valve is normal in structure. Pulmonic valve  regurgitation  is mild by color flow Doppler.  10. The ascending aorta and aortic root are normal in size and structure.  11. The inferior vena cava was dilated in size with <50% respiratory  variability. Past Medical History:  Diagnosis Date  . ALLERGIC RHINITIS   . Arthritis   . Chronic diastolic heart failure ([place]) 05/05/2013   Echo 03/2018:  EF 87-86, +diastolic dysfunction, GLS -20.5%, normal RVSF, RVSP 42.5 (mild elevated), severe LAE, small pericardial eff, mild MAc, mod AV calc, mild PI  . CKD (chronic kidney disease)   . Diabetes mellitus   . Heart murmur    at birth  . Hyperlipidemia   . Hypertension   . Hypothyroid   . Iron deficiency anemia   .  Obesity   . OSA on CPAP   . Previous back surgery    x 2  . S/P arthroscopy     Past Surgical History:  Procedure Laterality Date  . BREAST BIOPSY  20+ yrs ago   dont know which breast per pt; benign  . BREAST REDUCTION SURGERY  1982  . KNEE SURGERY     x 2  . LUMBAR DISC SURGERY     x 2  . REDUCTION MAMMAPLASTY Bilateral 1985  . TOE SURGERY    . TOTAL ABDOMINAL HYSTERECTOMY  1997   partial hysterectomy- still has ovaries  . TREATMENT FISTULA ANAL      MEDICATIONS: . acetaminophen (TYLENOL) 500 MG tablet  . albuterol (VENTOLIN HFA) 108 (90 Base) MCG/ACT inhaler  . allopurinol (ZYLOPRIM) 100 MG tablet  . amLODipine (NORVASC) 10 MG tablet  . aspirin EC 81 MG tablet  . B Complex Vitamins (B COMPLEX PO)  . B-D INS SYRINGE 0.5CC/30GX1/2" 30G X 1/2" 0.5 ML MISC  . BIOTIN 5000 PO  . calcitRIOL (ROCALTROL) 0.25 MCG capsule  . carvedilol (COREG) 6.25 MG tablet  . cetirizine (ZYRTEC) 10 MG tablet  . cloNIDine (CATAPRES - DOSED IN MG/24 HR) 0.3 mg/24hr  . diclofenac sodium (VOLTAREN) 1 % GEL  . insulin glargine (LANTUS) 100 UNIT/ML injection  . levothyroxine (LEVO-T) 112 MCG tablet  . Multiple Vitamins-Minerals (PRESERVISION AREDS PO)  . Omega-3 Fatty Acids (FISH OIL) 500 MG CAPS  . rosuvastatin (CRESTOR) 10 MG tablet  . SYSTANE COMPLETE 0.6 % SOLN  . torsemide (DEMADEX) 100 MG tablet  . zolpidem (AMBIEN) 10 MG tablet   No current facility-administered medications for this encounter.   Marland Kitchen epoetin alfa-epbx (RETACRIT) injection 15,000 Units    Tiffany [name] [facility] Pre-Surgical Testing [phone] 06/25/19  2:00 PM

## 2019-06-25 NOTE — Anesthesia Preprocedure Evaluation (Addendum)
Anesthesia Evaluation  Patient identified by MRN, date of birth, ID band Patient awake    Reviewed: Allergy & Precautions, NPO status , Patient's Chart, lab work & pertinent test results, reviewed documented beta blocker date and time   History of Anesthesia Complications Negative for: history of anesthetic complications  Airway Mallampati: III  TM Distance: >3 FB Neck ROM: Full    Dental  (+) Missing,    Pulmonary sleep apnea and Continuous Positive Airway Pressure Ventilation ,    Pulmonary exam normal        Cardiovascular hypertension, Pt. on medications and Pt. on home beta blockers Normal cardiovascular exam  Echo 03/11/2018: EF 60-65%, impaired diastolic relaxation, mildly elevated RVSP 42.5 mmHg, severe LAE, small pericardial effusion, mild PR    Neuro/Psych negative neurological ROS  negative psych ROS   GI/Hepatic negative GI ROS, Neg liver ROS,   Endo/Other  diabetes, Type 2, Insulin DependentHypothyroidism   Renal/GU Renal InsufficiencyRenal disease (Cr 3.09)  negative genitourinary   Musculoskeletal  (+) Arthritis ,   Abdominal   Peds  Hematology  (+) anemia , REFUSES BLOOD PRODUCTS, Hgb 9.5   Anesthesia Other Findings Day of surgery medications reviewed with patient.  Reproductive/Obstetrics negative OB ROS                           Anesthesia Physical Anesthesia Plan  ASA: III  Anesthesia Plan: Spinal   Post-op Pain Management:  [facility] for Post-op pain   Induction:   PONV Risk Score and Plan: 3 and Treatment may vary due to age or medical condition, Ondansetron, Propofol infusion and Dexamethasone  Airway Management Planned: Natural Airway and Simple Face Mask  Additional Equipment: None  Intra-op Plan:   Post-operative Plan:   Informed Consent: I have reviewed the patients History and Physical, chart, labs and discussed the procedure including the risks,  benefits and alternatives for the proposed anesthesia with the patient or authorized representative who has indicated his/her understanding and acceptance.       Plan Discussed with: CRNA  Anesthesia Plan Comments: (See PAT note 06/25/2019, Konrad Felix, PA-C)     Anesthesia Quick Evaluation

## 2019-06-28 ENCOUNTER — Other Ambulatory Visit (HOSPITAL_COMMUNITY): Admission: RE | Admit: 2019-06-28 | Payer: Medicare Other | Source: Ambulatory Visit

## 2019-06-30 ENCOUNTER — Other Ambulatory Visit (HOSPITAL_COMMUNITY)
Admission: RE | Admit: 2019-06-30 | Discharge: 2019-06-30 | Disposition: A | Discharge status: Home / Self Care | Payer: Medicare Other | Source: Ambulatory Visit | Attending: Orthopedic Surgery | Admitting: Orthopedic Surgery

## 2019-06-30 DIAGNOSIS — Z20822 Contact with and (suspected) exposure to covid-19: Secondary | ICD-10-CM | POA: Diagnosis not present

## 2019-06-30 DIAGNOSIS — Z01812 Encounter for preprocedural laboratory examination: Secondary | ICD-10-CM | POA: Diagnosis present

## 2019-06-30 LAB — SARS CORONAVIRUS 2 (TAT 6-24 HRS): SARS Coronavirus 2: NEGATIVE

## 2019-06-30 MED ORDER — DEXTROSE 5 % IV SOLN
3.0000 g | INTRAVENOUS | Status: AC
  Administered 2019-07-01: 3 g via INTRAVENOUS
  Filled 2019-06-30: qty 3

## 2019-07-01 ENCOUNTER — Encounter (HOSPITAL_COMMUNITY)
Admission: RE | Disposition: A | Discharge status: Home / Self Care | Payer: Self-pay | Source: Ambulatory Visit | Attending: Orthopedic Surgery

## 2019-07-01 ENCOUNTER — Encounter (HOSPITAL_COMMUNITY): Payer: Self-pay | Admitting: Orthopedic Surgery

## 2019-07-01 ENCOUNTER — Other Ambulatory Visit: Payer: Self-pay

## 2019-07-01 ENCOUNTER — Ambulatory Visit (HOSPITAL_COMMUNITY): Payer: Medicare Other | Admitting: Certified Registered Nurse Anesthetist

## 2019-07-01 ENCOUNTER — Ambulatory Visit (HOSPITAL_COMMUNITY): Payer: Medicare Other | Admitting: Physician Assistant

## 2019-07-01 ENCOUNTER — Observation Stay (HOSPITAL_COMMUNITY)
Admission: RE | Admit: 2019-07-01 | Discharge: 2019-07-02 | Disposition: A | Discharge status: Home / Self Care | Payer: Medicare Other | Source: Ambulatory Visit | Attending: Orthopedic Surgery | Admitting: Orthopedic Surgery

## 2019-07-01 DIAGNOSIS — Z794 Long term (current) use of insulin: Secondary | ICD-10-CM | POA: Diagnosis not present

## 2019-07-01 DIAGNOSIS — Z79899 Other long term (current) drug therapy: Secondary | ICD-10-CM | POA: Diagnosis not present

## 2019-07-01 DIAGNOSIS — J449 Chronic obstructive pulmonary disease, unspecified: Secondary | ICD-10-CM | POA: Insufficient documentation

## 2019-07-01 DIAGNOSIS — G4733 Obstructive sleep apnea (adult) (pediatric): Secondary | ICD-10-CM | POA: Insufficient documentation

## 2019-07-01 DIAGNOSIS — I252 Old myocardial infarction: Secondary | ICD-10-CM | POA: Diagnosis not present

## 2019-07-01 DIAGNOSIS — M1712 Unilateral primary osteoarthritis, left knee: Principal | ICD-10-CM | POA: Insufficient documentation

## 2019-07-01 DIAGNOSIS — I130 Hypertensive heart and chronic kidney disease with heart failure and stage 1 through stage 4 chronic kidney disease, or unspecified chronic kidney disease: Secondary | ICD-10-CM | POA: Diagnosis not present

## 2019-07-01 DIAGNOSIS — Z96652 Presence of left artificial knee joint: Secondary | ICD-10-CM

## 2019-07-01 DIAGNOSIS — Z791 Long term (current) use of non-steroidal anti-inflammatories (NSAID): Secondary | ICD-10-CM | POA: Diagnosis not present

## 2019-07-01 DIAGNOSIS — E785 Hyperlipidemia, unspecified: Secondary | ICD-10-CM | POA: Insufficient documentation

## 2019-07-01 DIAGNOSIS — I10 Essential (primary) hypertension: Secondary | ICD-10-CM | POA: Diagnosis not present

## 2019-07-01 DIAGNOSIS — E039 Hypothyroidism, unspecified: Secondary | ICD-10-CM | POA: Diagnosis not present

## 2019-07-01 DIAGNOSIS — Z6837 Body mass index (BMI) 37.0-37.9, adult: Secondary | ICD-10-CM | POA: Insufficient documentation

## 2019-07-01 DIAGNOSIS — E1122 Type 2 diabetes mellitus with diabetic chronic kidney disease: Secondary | ICD-10-CM | POA: Insufficient documentation

## 2019-07-01 DIAGNOSIS — I2510 Atherosclerotic heart disease of native coronary artery without angina pectoris: Secondary | ICD-10-CM | POA: Diagnosis not present

## 2019-07-01 DIAGNOSIS — G8929 Other chronic pain: Secondary | ICD-10-CM | POA: Insufficient documentation

## 2019-07-01 DIAGNOSIS — E669 Obesity, unspecified: Secondary | ICD-10-CM | POA: Diagnosis not present

## 2019-07-01 DIAGNOSIS — Z7982 Long term (current) use of aspirin: Secondary | ICD-10-CM | POA: Insufficient documentation

## 2019-07-01 DIAGNOSIS — N183 Chronic kidney disease, stage 3 unspecified: Secondary | ICD-10-CM | POA: Diagnosis not present

## 2019-07-01 HISTORY — PX: TOTAL KNEE ARTHROPLASTY: SHX125

## 2019-07-01 LAB — GLUCOSE, CAPILLARY
Glucose-Capillary: 79 mg/dL (ref 70–99)
Glucose-Capillary: 105 mg/dL — ABNORMAL HIGH (ref 70–99)
Glucose-Capillary: 118 mg/dL — ABNORMAL HIGH (ref 70–99)
Glucose-Capillary: 247 mg/dL — ABNORMAL HIGH (ref 70–99)
Glucose-Capillary: 200 mg/dL — ABNORMAL HIGH (ref 70–99)

## 2019-07-01 SURGERY — ARTHROPLASTY, KNEE, TOTAL
Anesthesia: Spinal | Site: Knee | Laterality: Left

## 2019-07-01 MED ORDER — PHENYLEPHRINE HCL (PRESSORS) 10 MG/ML IV SOLN
INTRAVENOUS | Status: AC
  Filled 2019-07-01: qty 1

## 2019-07-01 MED ORDER — LORATADINE 10 MG PO TABS
10.0000 mg | ORAL_TABLET | Freq: Every day | ORAL | Status: DC
  Administered 2019-07-02: 10 mg via ORAL
  Filled 2019-07-01 (×2): qty 1

## 2019-07-01 MED ORDER — TRANEXAMIC ACID-NACL 1000-0.7 MG/100ML-% IV SOLN
1000.0000 mg | INTRAVENOUS | Status: AC
  Administered 2019-07-01: 1000 mg via INTRAVENOUS
  Filled 2019-07-01: qty 100

## 2019-07-01 MED ORDER — PHENOL 1.4 % MT LIQD: 1.0000 | OROMUCOSAL | Status: DC | PRN

## 2019-07-01 MED ORDER — FENTANYL CITRATE (PF) 100 MCG/2ML IJ SOLN
INTRAMUSCULAR | Status: AC
  Filled 2019-07-01: qty 2

## 2019-07-01 MED ORDER — MIDAZOLAM HCL 2 MG/2ML IJ SOLN
INTRAMUSCULAR | Status: AC
  Filled 2019-07-01: qty 2

## 2019-07-01 MED ORDER — PROMETHAZINE HCL 25 MG/ML IJ SOLN: 6.2500 mg | INTRAMUSCULAR | Status: DC | PRN

## 2019-07-01 MED ORDER — TRANEXAMIC ACID-NACL 1000-0.7 MG/100ML-% IV SOLN
1000.0000 mg | Freq: Once | INTRAVENOUS | Status: AC
  Administered 2019-07-01: 1000 mg via INTRAVENOUS
  Filled 2019-07-01: qty 100

## 2019-07-01 MED ORDER — SODIUM CHLORIDE 0.9 % IV SOLN: INTRAVENOUS | Status: DC

## 2019-07-01 MED ORDER — ACETAMINOPHEN 325 MG PO TABS: 325.0000 mg | ORAL_TABLET | Freq: Four times a day (QID) | ORAL | Status: DC | PRN

## 2019-07-01 MED ORDER — OXYCODONE HCL 5 MG PO TABS: 5.0000 mg | ORAL_TABLET | Freq: Once | ORAL | Status: DC | PRN

## 2019-07-01 MED ORDER — BISACODYL 10 MG RE SUPP: 10.0000 mg | Freq: Every day | RECTAL | Status: DC | PRN

## 2019-07-01 MED ORDER — SODIUM CHLORIDE (PF) 0.9 % IJ SOLN
INTRAMUSCULAR | Status: AC
  Filled 2019-07-01: qty 50

## 2019-07-01 MED ORDER — TORSEMIDE 100 MG PO TABS
100.0000 mg | ORAL_TABLET | ORAL | Status: DC
  Administered 2019-07-01: 100 mg via ORAL
  Filled 2019-07-01 (×3): qty 1

## 2019-07-01 MED ORDER — CARVEDILOL 6.25 MG PO TABS
6.2500 mg | ORAL_TABLET | Freq: Two times a day (BID) | ORAL | Status: DC
  Administered 2019-07-01 – 2019-07-02 (×2): 6.25 mg via ORAL
  Filled 2019-07-01 (×2): qty 1

## 2019-07-01 MED ORDER — PHENYLEPHRINE HCL-NACL 10-0.9 MG/250ML-% IV SOLN
INTRAVENOUS | Status: DC | PRN
  Administered 2019-07-01: 15 ug/min via INTRAVENOUS

## 2019-07-01 MED ORDER — METOCLOPRAMIDE HCL 5 MG/ML IJ SOLN: 5.0000 mg | Freq: Three times a day (TID) | INTRAMUSCULAR | Status: DC | PRN

## 2019-07-01 MED ORDER — INSULIN GLARGINE 100 UNIT/ML ~~LOC~~ SOLN
18.0000 [IU] | Freq: Every day | SUBCUTANEOUS | Status: DC
  Administered 2019-07-01: 18 [IU] via SUBCUTANEOUS
  Filled 2019-07-01 (×2): qty 0.18

## 2019-07-01 MED ORDER — PROPOFOL 500 MG/50ML IV EMUL
INTRAVENOUS | Status: AC
  Filled 2019-07-01: qty 50

## 2019-07-01 MED ORDER — FENTANYL CITRATE (PF) 100 MCG/2ML IJ SOLN: 25.0000 ug | INTRAMUSCULAR | Status: DC | PRN

## 2019-07-01 MED ORDER — ONDANSETRON HCL 4 MG PO TABS: 4.0000 mg | ORAL_TABLET | Freq: Four times a day (QID) | ORAL | Status: DC | PRN

## 2019-07-01 MED ORDER — CEFAZOLIN SODIUM-DEXTROSE 2-4 GM/100ML-% IV SOLN
2.0000 g | Freq: Four times a day (QID) | INTRAVENOUS | Status: AC
  Administered 2019-07-01 (×2): 2 g via INTRAVENOUS
  Filled 2019-07-01 (×2): qty 100

## 2019-07-01 MED ORDER — 0.9 % SODIUM CHLORIDE (POUR BTL) OPTIME
TOPICAL | Status: DC | PRN
  Administered 2019-07-01: 1000 mL

## 2019-07-01 MED ORDER — ACETAMINOPHEN 500 MG PO TABS
500.0000 mg | ORAL_TABLET | Freq: Four times a day (QID) | ORAL | Status: AC
  Administered 2019-07-01 (×2): 500 mg via ORAL
  Filled 2019-07-01 (×2): qty 1

## 2019-07-01 MED ORDER — FERROUS SULFATE 325 (65 FE) MG PO TABS
325.0000 mg | ORAL_TABLET | Freq: Two times a day (BID) | ORAL | Status: DC
  Administered 2019-07-01 – 2019-07-02 (×2): 325 mg via ORAL
  Filled 2019-07-01 (×2): qty 1

## 2019-07-01 MED ORDER — INSULIN ASPART 100 UNIT/ML ~~LOC~~ SOLN
0.0000 [IU] | Freq: Three times a day (TID) | SUBCUTANEOUS | Status: DC
  Administered 2019-07-01: 5 [IU] via SUBCUTANEOUS

## 2019-07-01 MED ORDER — DEXAMETHASONE SODIUM PHOSPHATE 10 MG/ML IJ SOLN
10.0000 mg | Freq: Once | INTRAMUSCULAR | Status: AC
  Administered 2019-07-01: 10 mg via INTRAVENOUS

## 2019-07-01 MED ORDER — BUPIVACAINE IN DEXTROSE 0.75-8.25 % IT SOLN
INTRATHECAL | Status: DC | PRN
  Administered 2019-07-01: 1.7 mL via INTRATHECAL

## 2019-07-01 MED ORDER — ZOLPIDEM TARTRATE 5 MG PO TABS
5.0000 mg | ORAL_TABLET | Freq: Every evening | ORAL | Status: DC | PRN
  Administered 2019-07-01: 5 mg via ORAL
  Filled 2019-07-01: qty 1

## 2019-07-01 MED ORDER — MORPHINE SULFATE (PF) 2 MG/ML IV SOLN: 0.5000 mg | INTRAVENOUS | Status: DC | PRN

## 2019-07-01 MED ORDER — METOCLOPRAMIDE HCL 5 MG PO TABS: 5.0000 mg | ORAL_TABLET | Freq: Three times a day (TID) | ORAL | Status: DC | PRN

## 2019-07-01 MED ORDER — AMLODIPINE BESYLATE 10 MG PO TABS
10.0000 mg | ORAL_TABLET | Freq: Two times a day (BID) | ORAL | Status: DC
  Administered 2019-07-01 – 2019-07-02 (×2): 10 mg via ORAL
  Filled 2019-07-01 (×2): qty 1

## 2019-07-01 MED ORDER — MENTHOL 3 MG MT LOZG: 1.0000 | LOZENGE | OROMUCOSAL | Status: DC | PRN

## 2019-07-01 MED ORDER — METHOCARBAMOL 500 MG IVPB - SIMPLE MED
500.0000 mg | Freq: Four times a day (QID) | INTRAVENOUS | Status: DC | PRN
  Filled 2019-07-01: qty 50

## 2019-07-01 MED ORDER — DEXAMETHASONE SODIUM PHOSPHATE 10 MG/ML IJ SOLN
10.0000 mg | Freq: Once | INTRAMUSCULAR | Status: AC
  Administered 2019-07-02: 10 mg via INTRAVENOUS
  Filled 2019-07-01: qty 1

## 2019-07-01 MED ORDER — DEXAMETHASONE SODIUM PHOSPHATE 10 MG/ML IJ SOLN
INTRAMUSCULAR | Status: AC
  Filled 2019-07-01: qty 1

## 2019-07-01 MED ORDER — METHOCARBAMOL 500 MG PO TABS
500.0000 mg | ORAL_TABLET | Freq: Four times a day (QID) | ORAL | Status: DC | PRN
  Administered 2019-07-01: 500 mg via ORAL
  Filled 2019-07-01: qty 1

## 2019-07-01 MED ORDER — MAGNESIUM CITRATE PO SOLN: 1.0000 | Freq: Once | ORAL | Status: DC | PRN

## 2019-07-01 MED ORDER — CALCITRIOL 0.25 MCG PO CAPS
0.2500 ug | ORAL_CAPSULE | Freq: Every day | ORAL | Status: DC
  Administered 2019-07-01: 0.25 ug via ORAL
  Filled 2019-07-01 (×2): qty 1

## 2019-07-01 MED ORDER — LACTATED RINGERS IV SOLN: INTRAVENOUS | Status: DC

## 2019-07-01 MED ORDER — STERILE WATER FOR IRRIGATION IR SOLN
Status: DC | PRN
  Administered 2019-07-01: 3000 mL

## 2019-07-01 MED ORDER — HYDROCODONE-ACETAMINOPHEN 7.5-325 MG PO TABS: 1.0000 | ORAL_TABLET | ORAL | Status: DC | PRN

## 2019-07-01 MED ORDER — SODIUM CHLORIDE 0.9 % IR SOLN
Status: DC | PRN
  Administered 2019-07-01: 1000 mL

## 2019-07-01 MED ORDER — TORSEMIDE 20 MG PO TABS
150.0000 mg | ORAL_TABLET | ORAL | Status: DC
  Administered 2019-07-02: 150 mg via ORAL
  Filled 2019-07-01: qty 1

## 2019-07-01 MED ORDER — DIPHENHYDRAMINE HCL 12.5 MG/5ML PO ELIX: 12.5000 mg | ORAL_SOLUTION | ORAL | Status: DC | PRN

## 2019-07-01 MED ORDER — KETOROLAC TROMETHAMINE 30 MG/ML IJ SOLN
INTRAMUSCULAR | Status: AC
  Filled 2019-07-01: qty 1

## 2019-07-01 MED ORDER — POLYVINYL ALCOHOL 1.4 % OP SOLN
1.0000 [drp] | Freq: Three times a day (TID) | OPHTHALMIC | Status: DC
  Administered 2019-07-01 – 2019-07-02 (×3): 1 [drp] via OPHTHALMIC
  Filled 2019-07-01 (×2): qty 15

## 2019-07-01 MED ORDER — PROPOFOL 10 MG/ML IV BOLUS
INTRAVENOUS | Status: AC
  Filled 2019-07-01: qty 20

## 2019-07-01 MED ORDER — CHLORHEXIDINE GLUCONATE 4 % EX LIQD: 60.0000 mL | Freq: Once | CUTANEOUS | Status: DC

## 2019-07-01 MED ORDER — DEXAMETHASONE SODIUM PHOSPHATE 10 MG/ML IJ SOLN: INTRAMUSCULAR | Status: DC | PRN

## 2019-07-01 MED ORDER — OXYCODONE HCL 5 MG/5ML PO SOLN: 5.0000 mg | Freq: Once | ORAL | Status: DC | PRN

## 2019-07-01 MED ORDER — BUPIVACAINE-EPINEPHRINE (PF) 0.25% -1:200000 IJ SOLN
INTRAMUSCULAR | Status: DC | PRN
  Administered 2019-07-01: 30 mL

## 2019-07-01 MED ORDER — PROPOFOL 500 MG/50ML IV EMUL
INTRAVENOUS | Status: DC | PRN
  Administered 2019-07-01: 50 ug/kg/min via INTRAVENOUS

## 2019-07-01 MED ORDER — MIDAZOLAM HCL 5 MG/5ML IJ SOLN
INTRAMUSCULAR | Status: DC | PRN
  Administered 2019-07-01 (×2): 1 mg via INTRAVENOUS

## 2019-07-01 MED ORDER — POVIDONE-IODINE 10 % EX SWAB
2.0000 "application " | Freq: Once | CUTANEOUS | Status: AC
  Administered 2019-07-01: 2 via TOPICAL

## 2019-07-01 MED ORDER — BUPIVACAINE-EPINEPHRINE (PF) 0.25% -1:200000 IJ SOLN
INTRAMUSCULAR | Status: AC
  Filled 2019-07-01: qty 30

## 2019-07-01 MED ORDER — SODIUM CHLORIDE (PF) 0.9 % IJ SOLN
INTRAMUSCULAR | Status: DC | PRN
  Administered 2019-07-01: 30 mL

## 2019-07-01 MED ORDER — BUPIVACAINE-EPINEPHRINE (PF) 0.5% -1:200000 IJ SOLN
INTRAMUSCULAR | Status: DC | PRN
  Administered 2019-07-01: 15 mL via PERINEURAL

## 2019-07-01 MED ORDER — ACETAMINOPHEN 500 MG PO TABS
1000.0000 mg | ORAL_TABLET | Freq: Once | ORAL | Status: AC
  Administered 2019-07-01: 1000 mg via ORAL
  Filled 2019-07-01: qty 2

## 2019-07-01 MED ORDER — ONDANSETRON HCL 4 MG/2ML IJ SOLN: 4.0000 mg | Freq: Four times a day (QID) | INTRAMUSCULAR | Status: DC | PRN

## 2019-07-01 MED ORDER — KETOROLAC TROMETHAMINE 30 MG/ML IJ SOLN
INTRAMUSCULAR | Status: DC | PRN
  Administered 2019-07-01: 30 mg

## 2019-07-01 MED ORDER — ALLOPURINOL 100 MG PO TABS
100.0000 mg | ORAL_TABLET | Freq: Two times a day (BID) | ORAL | Status: DC
  Administered 2019-07-01 – 2019-07-02 (×2): 100 mg via ORAL
  Filled 2019-07-01 (×2): qty 1

## 2019-07-01 MED ORDER — ASPIRIN 81 MG PO CHEW
81.0000 mg | CHEWABLE_TABLET | Freq: Two times a day (BID) | ORAL | Status: DC
  Administered 2019-07-01 – 2019-07-02 (×2): 81 mg via ORAL
  Filled 2019-07-01 (×2): qty 1

## 2019-07-01 MED ORDER — ROSUVASTATIN CALCIUM 10 MG PO TABS
10.0000 mg | ORAL_TABLET | Freq: Every evening | ORAL | Status: DC
  Administered 2019-07-01: 10 mg via ORAL
  Filled 2019-07-01: qty 1

## 2019-07-01 MED ORDER — ONDANSETRON HCL 4 MG/2ML IJ SOLN
INTRAMUSCULAR | Status: DC | PRN
  Administered 2019-07-01: 4 mg via INTRAVENOUS

## 2019-07-01 MED ORDER — POLYETHYLENE GLYCOL 3350 17 G PO PACK
17.0000 g | PACK | Freq: Two times a day (BID) | ORAL | Status: DC
  Administered 2019-07-01 – 2019-07-02 (×3): 17 g via ORAL
  Filled 2019-07-01 (×3): qty 1

## 2019-07-01 MED ORDER — HYDROCODONE-ACETAMINOPHEN 5-325 MG PO TABS
1.0000 | ORAL_TABLET | ORAL | Status: DC | PRN
  Administered 2019-07-01 (×2): 1 via ORAL
  Administered 2019-07-02 (×2): 2 via ORAL
  Filled 2019-07-01: qty 1
  Filled 2019-07-01 (×2): qty 2
  Filled 2019-07-01: qty 1
  Filled 2019-07-01: qty 2

## 2019-07-01 MED ORDER — DOCUSATE SODIUM 100 MG PO CAPS
100.0000 mg | ORAL_CAPSULE | Freq: Two times a day (BID) | ORAL | Status: DC
  Administered 2019-07-01 – 2019-07-02 (×3): 100 mg via ORAL
  Filled 2019-07-01 (×3): qty 1

## 2019-07-01 MED ORDER — LEVOTHYROXINE SODIUM 112 MCG PO TABS: 336.0000 ug | ORAL_TABLET | ORAL | Status: DC

## 2019-07-01 MED ORDER — ONDANSETRON HCL 4 MG/2ML IJ SOLN
INTRAMUSCULAR | Status: AC
  Filled 2019-07-01: qty 2

## 2019-07-01 MED ORDER — ALBUTEROL SULFATE (2.5 MG/3ML) 0.083% IN NEBU: 2.5000 mg | INHALATION_SOLUTION | Freq: Four times a day (QID) | RESPIRATORY_TRACT | Status: DC | PRN

## 2019-07-01 MED ORDER — LEVOTHYROXINE SODIUM 112 MCG PO TABS
224.0000 ug | ORAL_TABLET | ORAL | Status: DC
  Administered 2019-07-02: 224 ug via ORAL
  Filled 2019-07-01: qty 2

## 2019-07-01 MED ORDER — FENTANYL CITRATE (PF) 100 MCG/2ML IJ SOLN
INTRAMUSCULAR | Status: DC | PRN
  Administered 2019-07-01 (×2): 50 ug via INTRAVENOUS

## 2019-07-01 MED ORDER — PROPOFOL 10 MG/ML IV BOLUS
INTRAVENOUS | Status: DC | PRN
  Administered 2019-07-01 (×4): 10 mg via INTRAVENOUS

## 2019-07-01 MED ORDER — ALUM & MAG HYDROXIDE-SIMETH 200-200-20 MG/5ML PO SUSP: 15.0000 mL | ORAL | Status: DC | PRN

## 2019-07-01 SURGICAL SUPPLY — 61 items
ATTUNE MED ANAT PAT 41 KNEE (Knees) ×2 IMPLANT
ATTUNE PS FEM LT SZ 5 CEM KNEE (Femur) ×2 IMPLANT
ATTUNE PSRP INSR SZ5 8 KNEE (Insert) ×2 IMPLANT
BAG ZIPLOCK 12X15 (MISCELLANEOUS) IMPLANT
BASE TIBIA ATTUNE KNEE SYS SZ6 (Knees) ×1 IMPLANT
BLADE SAW SGTL 11.0X1.19X90.0M (BLADE) IMPLANT
BLADE SAW SGTL 13.0X1.19X90.0M (BLADE) ×2 IMPLANT
BLADE SURG SZ10 CARB STEEL (BLADE) ×4 IMPLANT
BNDG ELASTIC 6X5.8 VLCR STR LF (GAUZE/BANDAGES/DRESSINGS) ×2 IMPLANT
BOWL SMART MIX CTS (DISPOSABLE) ×2 IMPLANT
CEMENT HV SMART SET (Cement) ×4 IMPLANT
COVER SURGICAL LIGHT HANDLE (MISCELLANEOUS) ×2 IMPLANT
COVER WAND RF STERILE (DRAPES) IMPLANT
CUFF TOURN SGL QUICK 34 (TOURNIQUET CUFF) ×2
CUFF TRNQT CYL 34X4.125X (TOURNIQUET CUFF) ×1 IMPLANT
DECANTER SPIKE VIAL GLASS SM (MISCELLANEOUS) ×4 IMPLANT
DERMABOND ADVANCED (GAUZE/BANDAGES/DRESSINGS) ×1
DERMABOND ADVANCED .7 DNX12 (GAUZE/BANDAGES/DRESSINGS) ×1 IMPLANT
DRAPE U-SHAPE 47X51 STRL (DRAPES) ×2 IMPLANT
DRESSING AQUACEL AG SP 3.5X10 (GAUZE/BANDAGES/DRESSINGS) ×1 IMPLANT
DRSG AQUACEL AG ADV 3.5X10 (GAUZE/BANDAGES/DRESSINGS) ×2 IMPLANT
DRSG AQUACEL AG SP 3.5X10 (GAUZE/BANDAGES/DRESSINGS) ×2
DURAPREP 26ML APPLICATOR (WOUND CARE) ×4 IMPLANT
ELECT REM PT RETURN 15FT ADLT (MISCELLANEOUS) ×2 IMPLANT
GLOVE BIO SURGEON STRL SZ 6 (GLOVE) ×2 IMPLANT
GLOVE BIOGEL PI IND STRL 6.5 (GLOVE) ×1 IMPLANT
GLOVE BIOGEL PI IND STRL 7.5 (GLOVE) ×1 IMPLANT
GLOVE BIOGEL PI IND STRL 8.5 (GLOVE) ×1 IMPLANT
GLOVE BIOGEL PI INDICATOR 6.5 (GLOVE) ×1
GLOVE BIOGEL PI INDICATOR 7.5 (GLOVE) ×1
GLOVE BIOGEL PI INDICATOR 8.5 (GLOVE) ×1
GLOVE ECLIPSE 8.0 STRL XLNG CF (GLOVE) ×2 IMPLANT
GLOVE ORTHO TXT STRL SZ7.5 (GLOVE) ×2 IMPLANT
GOWN STRL REUS W/ TWL LRG LVL3 (GOWN DISPOSABLE) ×1 IMPLANT
GOWN STRL REUS W/TWL 2XL LVL3 (GOWN DISPOSABLE) ×2 IMPLANT
GOWN STRL REUS W/TWL LRG LVL3 (GOWN DISPOSABLE) ×4 IMPLANT
HANDPIECE INTERPULSE COAX TIP (DISPOSABLE) ×2
HOLDER FOLEY CATH W/STRAP (MISCELLANEOUS) IMPLANT
KIT TURNOVER KIT A (KITS) IMPLANT
MANIFOLD NEPTUNE II (INSTRUMENTS) ×2 IMPLANT
NDL SAFETY ECLIPSE 18X1.5 (NEEDLE) IMPLANT
NEEDLE HYPO 18GX1.5 SHARP (NEEDLE)
NS IRRIG 1000ML POUR BTL (IV SOLUTION) ×2 IMPLANT
PACK TOTAL KNEE CUSTOM (KITS) ×2 IMPLANT
PENCIL SMOKE EVACUATOR (MISCELLANEOUS) IMPLANT
PIN DRILL FIX HALF THREAD (BIT) ×2 IMPLANT
PIN FIX SIGMA LCS THRD HI (PIN) ×2 IMPLANT
PROTECTOR NERVE ULNAR (MISCELLANEOUS) ×2 IMPLANT
SET HNDPC FAN SPRY TIP SCT (DISPOSABLE) ×1 IMPLANT
SET PAD KNEE POSITIONER (MISCELLANEOUS) ×2 IMPLANT
SUT MNCRL AB 4-0 PS2 18 (SUTURE) ×2 IMPLANT
SUT STRATAFIX PDS+ 0 24IN (SUTURE) ×2 IMPLANT
SUT VIC AB 1 CT1 36 (SUTURE) ×2 IMPLANT
SUT VIC AB 2-0 CT1 27 (SUTURE) ×6
SUT VIC AB 2-0 CT1 TAPERPNT 27 (SUTURE) ×3 IMPLANT
SYR 3ML LL SCALE MARK (SYRINGE) ×2 IMPLANT
TIBIA ATTUNE KNEE SYS BASE SZ6 (Knees) ×2 IMPLANT
TRAY FOLEY MTR SLVR 16FR STAT (SET/KITS/TRAYS/PACK) ×2 IMPLANT
WATER STERILE IRR 1000ML POUR (IV SOLUTION) ×4 IMPLANT
WRAP KNEE MAXI GEL POST OP (GAUZE/BANDAGES/DRESSINGS) ×2 IMPLANT
YANKAUER SUCT BULB TIP 10FT TU (MISCELLANEOUS) ×2 IMPLANT

## 2019-07-01 NOTE — Plan of Care (Signed)
  Problem: Education: Goal: Knowledge of the prescribed therapeutic regimen will improve Outcome: Progressing   Problem: Pain Management: Goal: Pain level will decrease with appropriate interventions Outcome: Progressing   

## 2019-07-01 NOTE — Anesthesia Postprocedure Evaluation (Signed)
Anesthesia Post Note  Patient: Tiffany [name]  Procedure(s) Performed: TOTAL KNEE ARTHROPLASTY (Left Knee)     Patient location during evaluation: PACU Anesthesia Type: Spinal Level of consciousness: awake and alert and oriented Pain management: pain level controlled Vital Signs Assessment: post-procedure vital signs reviewed and stable Respiratory status: spontaneous breathing, nonlabored ventilation and respiratory function stable Cardiovascular status: blood pressure returned to baseline Postop Assessment: no apparent nausea or vomiting, spinal receding, no headache and no backache Anesthetic complications: no    Last Vitals:  Vitals:   07/01/19 1045 07/01/19 1100  BP: (!) 153/72 (!) 147/71  Pulse: (!) 56 (!) 50  Resp: 18 13  Temp:    SpO2: 100% 99%    Last Pain:  Vitals:   07/01/19 1030  TempSrc:   PainSc: 0-No pain                 Brennan Bailey

## 2019-07-01 NOTE — Anesthesia Procedure Notes (Signed)
Anesthesia Regional Block: Adductor canal block   Pre-Anesthetic Checklist: ,, timeout performed, Correct Patient, Correct Site, Correct Laterality, Correct Procedure, Correct Position, site marked, Risks and benefits discussed, pre-op evaluation,  At surgeon's request and post-op pain management  Laterality: Left  Prep: Maximum Sterile Barrier Precautions used, chloraprep       Needles:  Injection technique: Single-shot  Needle Type: Echogenic Stimulator Needle     Needle Length: 9cm  Needle Gauge: 22     Additional Needles:   Procedures:,,,, ultrasound used (permanent image in chart),,,,  Narrative:  Start time: 07/01/2019 7:27 AM End time: 07/01/2019 7:29 AM Injection made incrementally with aspirations every 5 mL.  Performed by: Personally  Anesthesiologist: Brennan Bailey, MD  Additional Notes: Risks, benefits, and alternative discussed. Patient gave consent for procedure. Patient prepped and draped in sterile fashion. Sedation administered, patient remains easily responsive to voice. Relevant anatomy identified with ultrasound guidance. Local anesthetic given in 5cc increments with no signs or symptoms of intravascular injection. No pain or paraesthesias with injection. Patient monitored throughout procedure with signs of LAST or immediate complications. Tolerated well. Ultrasound image placed in chart.  Tawny Asal, MD

## 2019-07-01 NOTE — Op Note (Signed)
NAME:  Tiffany [name]                      MEDICAL RECORD NO.:  [id number]                             FACILITY:  [facility]      PHYSICIAN:  Pietro Cassis. Alvan Dame, M.D.  DATE OF BIRTH:  02/03/1951      DATE OF PROCEDURE:  07/01/2019                                     OPERATIVE REPORT         PREOPERATIVE DIAGNOSIS:  Left knee osteoarthritis.      POSTOPERATIVE DIAGNOSIS:  Left knee osteoarthritis.      FINDINGS:  The patient was noted to have complete loss of cartilage and   bone-on-bone arthritis with associated osteophytes in the patellofemoral and lateral compartments of   the knee with a significant synovitis and associated effusion.  The patient had failed months of conservative treatment including medications, injection therapy, activity modification.     PROCEDURE:  Left total knee replacement.      COMPONENTS USED:  DePuy Attune rotating platform posterior stabilized knee   system, a size 5 femur, 6 tibia, size 8 mm PS AOX insert, and 41 anatomic patellar   button.      SURGEON:  Pietro Cassis. Alvan Dame, M.D.      ASSISTANT:  Griffith Citron, PA-C.      ANESTHESIA:  Regional and Spinal.      SPECIMENS:  None.      COMPLICATION:  None.      DRAINS:  None.  EBL: <100cc      TOURNIQUET TIME:   Total Tourniquet Time Documented: Thigh (Left) - 32 minutes Total: Thigh (Left) - 32 minutes  .      The patient was stable to the recovery room.      INDICATION FOR PROCEDURE:  Tiffany [name] is a 69 y.o. female patient of   mine.  The patient had been seen, evaluated, and treated for months conservatively in the   office with medication, activity modification, and injections.  The patient had   radiographic changes of bone-on-bone arthritis with endplate sclerosis and osteophytes noted.  Based on the radiographic changes and failed conservative measures, the patient   decided to proceed with definitive treatment, total knee replacement.  Risks of infection, DVT, component failure,  need for revision surgery, neurovascular injury were reviewed in the office setting.  The postop course was reviewed stressing the efforts to maximize post-operative satisfaction and function.  Consent was obtained for benefit of pain   relief.      PROCEDURE IN DETAIL:  The patient was brought to the operative theater.   Once adequate anesthesia, preoperative antibiotics, 3 gm of Ancef,1 gm of Tranexamic Acid, and 10 mg of Decadron administered, the patient was positioned supine with a left thigh tourniquet placed.  The  left lower extremity was prepped and draped in sterile fashion.  A time-   out was performed identifying the patient, planned procedure, and the appropriate extremity.      The left lower extremity was placed in the [facility] leg holder.  The leg was   exsanguinated, tourniquet elevated to 250 mmHg.  A midline incision was   made  followed by median parapatellar arthrotomy.  Following initial   exposure, attention was first directed to the patella.  Precut   measurement was noted to be 23 mm.  I resected down to 14 mm and used a   41 anatomic patellar button to restore patellar height as well as cover the cut surface.      The lug holes were drilled and a metal shim was placed to protect the   patella from retractors and saw blade during the procedure.      At this point, attention was now directed to the femur.  The femoral   canal was opened with a drill, irrigated to try to prevent fat emboli.  An   intramedullary rod was passed at 5 degrees valgus, 9 mm of bone was   resected off the distal femur.  Following this resection, the tibia was   subluxated anteriorly.  Using the extramedullary guide, 3 mm of bone was resected off   the proximal lateral tibia.  We confirmed the gap would be   stable medially and laterally with a size 6 spacer block as well as confirmed that the tibial cut was perpendicular in the coronal plane, checking with an alignment rod.      Once this was  done, I sized the femur to be a size 5 in the anterior-   posterior dimension, chose a standard component based on medial and   lateral dimension.  The size 5 rotation block was then pinned in   position anterior referenced using the C-clamp to set rotation.  The   anterior, posterior, and  chamfer cuts were made without difficulty nor   notching making certain that I was along the anterior cortex to help   with flexion gap stability.      The final box cut was made off the lateral aspect of distal femur.      At this point, the tibia was sized to be a size 6.  The size 6 tray was   then pinned in position through the medial third of the tubercle,   drilled, and keel punched.  Trial reduction was now carried with a 5 femur,  6 tibia, a size 8 mm PS insert, and the 41 anatomic patella botton.  The knee was brought to full extension with good flexion stability with the patella   tracking through the trochlea without application of pressure.  Given   all these findings the trial components removed.  Final components were   opened and cement was mixed.  The knee was irrigated with normal saline solution and pulse lavage.  The synovial lining was   then injected with 30 cc of 0.25% Marcaine with epinephrine, 1 cc of Toradol and 30 cc of NS for a total of 61 cc.     Final implants were then cemented onto cleaned and dried cut surfaces of bone with the knee brought to extension with a size 8 mm PS trial insert.      Once the cement had fully cured, excess cement was removed   throughout the knee.  I confirmed that I was satisfied with the range of   motion and stability, and the final size 8 mm PS AOX insert was chosen.  It was   placed into the knee.      The tourniquet had been let down at 32 minutes.  No significant   hemostasis was required.  The extensor mechanism was then reapproximated using #1 Vicryl and #  1 Stratafix sutures with the knee   in flexion.  The   remaining wound was closed  with 2-0 Vicryl and running 4-0 Monocryl.   The knee was cleaned, dried, dressed sterilely using Dermabond and   Aquacel dressing.  The patient was then   brought to recovery room in stable condition, tolerating the procedure   well.   Please note that Physician Assistant, Griffith Citron, PA-C was present for the entirety of the case, and was utilized for pre-operative positioning, peri-operative retractor management, general facilitation of the procedure and for primary wound closure at the end of the case.              Pietro Cassis Alvan Dame, M.D.    07/01/2019 9:03 AM

## 2019-07-01 NOTE — Discharge Instructions (Signed)

## 2019-07-01 NOTE — Evaluation (Signed)
Physical Therapy Evaluation Patient Details Name: Tiffany [name] MRN: [id number] DOB: 1950-10-29 Today's Date: 07/01/2019   History of Present Illness  Patient is 69 y.o. female s/p Lt TKA on 07/01/19 with PMH significant for OSA, HTN, HLD, DM, hypothyroidism, CKD, OA, obesity, lumbar surgery x2.    Clinical Impression  Chantrell Apsey is a 69 y.o. female POD 0 s/p Lt TKA. Patient reports independence with mobility at baseline with occasional use of SPC in home. Patient is now limited by functional impairments (see PT problem list below) and requires min assist for bed mobility and transfers with RW. Patient was able to step ~ 3 feet with RW and min assist to complete stand step transfer to recliner. Patient instructed in exercise to facilitate ROM and circulation. Patient will benefit from continued skilled PT interventions to address impairments and progress towards PLOF. Acute PT will follow to progress mobility and stair training in preparation for safe discharge home.     Follow Up Recommendations Follow surgeon's recommendation for DC plan and follow-up therapies;Home health PT(Pt would prefer HHPT as she lives alone with 7 steps)    Equipment Recommendations  Rolling walker with 5" wheels    Recommendations for Other Services       Precautions / Restrictions Precautions Precautions: Fall Restrictions Weight Bearing Restrictions: No Other Position/Activity Restrictions: WBAT      Mobility  Bed Mobility Overal bed mobility: Needs Assistance Bed Mobility: Supine to Sit     Supine to sit: Min assist;HOB elevated     General bed mobility comments: cues for sequencing and use of bed rail, assist for Lt LE mobility.   Transfers Overall transfer level: Needs assistance Equipment used: Rolling walker (2 wheeled) Transfers: Stand Pivot Transfers;Sit to/from Stand Sit to Stand: Min assist;From elevated surface Stand pivot transfers: Min assist;From elevated surface        General transfer comment: cues for safe hand placement/technique with RW, assist to steady with rising from elevated surface. manual facilitation/blocking at Lt knee to maintain Lt knee extension in stance. min assist to complete stand step transfer to bedside recliner.   Ambulation/Gait       Stairs       Wheelchair Mobility    Modified Rankin (Stroke Patients Only)       Balance Overall balance assessment: Needs assistance Sitting-balance support: Feet supported Sitting balance-Leahy Scale: Good     Standing balance support: During functional activity;Bilateral upper extremity supported Standing balance-Leahy Scale: Poor          Pertinent Vitals/Pain Pain Assessment: 0-10 Pain Score: 8  Pain Location: Lt knee Pain Descriptors / Indicators: Aching;Discomfort;Grimacing Pain Intervention(s): Limited activity within patient's tolerance;Monitored during session;Repositioned;Ice applied;Patient requesting pain meds-RN notified    Home Living Family/patient expects to be discharged to:: Private residence Living Arrangements: Alone Available Help at Discharge: Family(pt's neice will stay with her for ~ 2 nights) Type of Home: House Home Access: Stairs to enter Entrance Stairs-Rails: Can reach both Entrance Stairs-Number of Steps: 7 Home Layout: One level [place] - single point;Bedside commode;Shower seat      Prior Function Level of Independence: Independent;Independent with assistive device(s)         Comments: pt has been wearing a knee brace and using a SPC in her home but not in the community. 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      Hand Dominance   Dominant Hand: Right    Extremity/Trunk Assessment   Upper Extremity  Assessment Upper Extremity Assessment: Overall WFL for tasks assessed    Lower Extremity Assessment Lower Extremity Assessment: LLE deficits/detail LLE Deficits / Details: pt with good quad activaiton  but unable to perform SLR due to pain, 3+/5 for quad strength with MMT LLE Sensation: WNL LLE Coordination: WNL    Cervical / Trunk Assessment Cervical / Trunk Assessment: Other exceptions Cervical / Trunk Exceptions: large habitus  Communication   Communication: No difficulties  Cognition Arousal/Alertness: Awake/alert Behavior During Therapy: WFL for tasks assessed/performed Overall Cognitive Status: Within Functional Limits for tasks assessed           General Comments      Exercises Total Joint Exercises Ankle Circles/Pumps: AROM;Both;20 reps;Seated Quad Sets: AROM;Left;10 reps;Seated   Assessment/Plan    PT Assessment Patient needs continued PT services  PT Problem List Decreased strength;Decreased range of motion;Decreased activity tolerance;Decreased balance;Decreased mobility;Decreased knowledge of use of DME;Decreased knowledge of precautions;Pain       PT Treatment Interventions DME instruction;Gait training;Stair training;Functional mobility training;Therapeutic activities;Therapeutic exercise;Balance training;Patient/family education    PT Goals (Current goals can be found in the Care Plan section)  Acute Rehab PT Goals Patient Stated Goal: mvoe with less pain and get back to work PT Goal Formulation: With patient Time For Goal Achievement: 07/08/19 Potential to Achieve Goals: Good    Frequency 7X/week    AM-PAC PT "6 Clicks" Mobility  Outcome Measure Help needed turning from your back to your side while in a flat bed without using bedrails?: A Little Help needed moving from lying on your back to sitting on the side of a flat bed without using bedrails?: A Little Help needed moving to and from a bed to a chair (including a wheelchair)?: A Little Help needed standing up from a chair using your arms (e.g., wheelchair or bedside chair)?: A Little Help needed to walk in hospital room?: A Lot Help needed climbing 3-5 steps with a railing? : A Lot 6 Click  Score: 16    End of Session Equipment Utilized During Treatment: Gait belt Activity Tolerance: Patient tolerated treatment well;Patient limited by pain(limited gait due to pain) Patient left: in chair;with call bell/phone within reach;with chair alarm set Nurse Communication: Mobility status PT Visit Diagnosis: Muscle weakness (generalized) (M62.81);Difficulty in walking, not elsewhere classified (R26.2);Pain Pain - Right/Left: Left Pain - part of body: Knee    Time: 1334-1410 PT Time Calculation (min) (ACUTE ONLY): 36 min   Charges:   PT Evaluation $PT Eval Low Complexity: 1 Low PT Treatments $Therapeutic Exercise: 8-22 mins        Verner Mould, DPT Physical Therapist with [facility] [phone]  07/01/2019 2:33 PM

## 2019-07-01 NOTE — Anesthesia Procedure Notes (Signed)
Spinal  Patient location during procedure: OR Start time: 07/01/2019 7:43 AM End time: 07/01/2019 7:46 AM Staffing Performed: anesthesiologist  Anesthesiologist: Brennan Bailey, MD Preanesthetic Checklist Completed: patient identified, IV checked, risks and benefits discussed, surgical consent, monitors and equipment checked, pre-op evaluation and timeout performed Spinal Block Patient position: sitting Prep: DuraPrep and site prepped and draped Patient monitoring: continuous pulse ox, blood pressure and heart rate Approach: midline Location: L3-4 Injection technique: single-shot Needle Needle type: Pencan  Needle gauge: 24 G Needle length: 9 cm Additional Notes Risks, benefits, and alternative discussed. Patient gave consent to procedure. Prepped and draped in sitting position. Patient sedated but responsive to voice. Two attempts required-first at L3-4 right paramedian, second attempt midline at L2-3 (above lumbar surgical scar) with clear CSF obtained. Positive terminal aspiration. No pain or paraesthesias with injection. Patient tolerated procedure well. Vital signs stable. Tawny Asal, MD

## 2019-07-01 NOTE — Interval H&P Note (Signed)
History and Physical Interval Note:  07/01/2019 7:19 AM  Tiffany [name]  has presented today for surgery, with the diagnosis of Left knee osteoarthritis.  The various methods of treatment have been discussed with the patient and family. After consideration of risks, benefits and other options for treatment, the patient has consented to  Procedure(s) with comments: TOTAL KNEE ARTHROPLASTY (Left) - 70 min NO BLOOD PRODUCTS!! as a surgical intervention.  The patient's history has been reviewed, patient examined, no change in status, stable for surgery.  I have reviewed the patient's chart and labs.  Questions were answered to the patient's satisfaction.     Mauri Pole

## 2019-07-01 NOTE — Transfer of Care (Signed)
Immediate Anesthesia Transfer of Care Note  Patient: Tiffany [name]  Procedure(s) Performed: TOTAL KNEE ARTHROPLASTY (Left Knee)  Patient Location: PACU  Anesthesia Type:Spinal  Level of Consciousness: drowsy and patient cooperative  Airway & Oxygen Therapy: Patient Spontanous Breathing and Patient connected to face mask oxygen  Post-op Assessment: Report given to RN and Post -op Vital signs reviewed and stable  Post vital signs: Reviewed and stable  Last Vitals:  Vitals Value Taken Time  BP    Temp    Pulse 55 07/01/19 0935  Resp 13 07/01/19 0935  SpO2 100 % 07/01/19 0935  Vitals shown include unvalidated device data.  Last Pain:  Vitals:   07/01/19 0624  TempSrc: Oral  PainSc: 0-No pain         Complications: No apparent anesthesia complications

## 2019-07-02 ENCOUNTER — Encounter (HOSPITAL_COMMUNITY): Payer: Medicare Other

## 2019-07-02 ENCOUNTER — Encounter: Payer: Self-pay | Admitting: *Deleted

## 2019-07-02 DIAGNOSIS — M1712 Unilateral primary osteoarthritis, left knee: Secondary | ICD-10-CM | POA: Diagnosis not present

## 2019-07-02 LAB — CBC
HCT: 25.8 % — ABNORMAL LOW (ref 36.0–46.0)
Hemoglobin: 7.9 g/dL — ABNORMAL LOW (ref 12.0–15.0)
MCH: 26.7 pg (ref 26.0–34.0)
MCHC: 30.6 g/dL (ref 30.0–36.0)
MCV: 87.2 fL (ref 80.0–100.0)
Platelets: 117 10*3/uL — ABNORMAL LOW (ref 150–400)
RBC: 2.96 MIL/uL — ABNORMAL LOW (ref 3.87–5.11)
RDW: 18.4 % — ABNORMAL HIGH (ref 11.5–15.5)
WBC: 6 10*3/uL (ref 4.0–10.5)
nRBC: 0 % (ref 0.0–0.2)

## 2019-07-02 LAB — BASIC METABOLIC PANEL
Anion gap: 9 (ref 5–15)
BUN: 81 mg/dL — ABNORMAL HIGH (ref 8–23)
CO2: 24 mmol/L (ref 22–32)
Calcium: 8.5 mg/dL — ABNORMAL LOW (ref 8.9–10.3)
Chloride: 106 mmol/L (ref 98–111)
Creatinine, Ser: 3.22 mg/dL — ABNORMAL HIGH (ref 0.44–1.00)
GFR calc Af Amer: 16 mL/min — ABNORMAL LOW (ref >60)
GFR calc non Af Amer: 14 mL/min — ABNORMAL LOW (ref >60)
Glucose, Bld: 175 mg/dL — ABNORMAL HIGH (ref 70–99)
Potassium: 4.3 mmol/L (ref 3.5–5.1)
Sodium: 139 mmol/L (ref 135–145)

## 2019-07-02 LAB — GLUCOSE, CAPILLARY
Glucose-Capillary: 111 mg/dL — ABNORMAL HIGH (ref 70–99)
Glucose-Capillary: 137 mg/dL — ABNORMAL HIGH (ref 70–99)

## 2019-07-02 MED ORDER — HYDROCODONE-ACETAMINOPHEN 7.5-325 MG PO TABS: 1.0000 | ORAL_TABLET | ORAL | 0 refills | Status: DC | PRN

## 2019-07-02 MED ORDER — FERROUS SULFATE 325 (65 FE) MG PO TABS: 325.0000 mg | ORAL_TABLET | Freq: Three times a day (TID) | ORAL | 0 refills | Status: DC

## 2019-07-02 MED ORDER — METHOCARBAMOL 500 MG PO TABS: 500.0000 mg | ORAL_TABLET | Freq: Four times a day (QID) | ORAL | 0 refills | Status: DC | PRN

## 2019-07-02 MED ORDER — ASPIRIN 81 MG PO CHEW
81.0000 mg | CHEWABLE_TABLET | Freq: Two times a day (BID) | ORAL | 0 refills | Status: AC
Start: 2019-07-02 — End: 2019-08-01

## 2019-07-02 MED ORDER — DOCUSATE SODIUM 100 MG PO CAPS
100.0000 mg | ORAL_CAPSULE | Freq: Two times a day (BID) | ORAL | 0 refills | Status: DC
Start: 2019-07-02 — End: 2020-01-25

## 2019-07-02 MED ORDER — POLYETHYLENE GLYCOL 3350 17 G PO PACK
17.0000 g | PACK | Freq: Two times a day (BID) | ORAL | 0 refills | Status: DC
Start: 2019-07-02 — End: 2020-06-07

## 2019-07-02 NOTE — Progress Notes (Signed)
Subjective: 1 Day Post-Op Procedure(s) (LRB): TOTAL KNEE ARTHROPLASTY (Left) Patient reports pain as mild.   Patient seen in rounds with Dr. Alvan Dame. Patient is well, and has had no acute complaints or problems other than discomfort in the left knee. No acute events overnight. Foley catheter removed.  We will continue therapy today.   Objective: Vital signs in last 24 hours: Temp:  [97 F (36.1 C)-98.7 F (37.1 C)] 98.7 F (37.1 C) (05/28 0601) Pulse Rate:  [48-66] 48 (05/28 0601) Resp:  [10-19] 17 (05/28 0601) BP: (133-185)/(61-86) 151/68 (05/28 0601) SpO2:  [95 %-100 %] 100 % (05/28 0601)  Intake/Output from previous day:  Intake/Output Summary (Last 24 hours) at 07/02/2019 0734 Last data filed at 07/02/2019 0600 Gross per 24 hour  Intake 3400.31 ml  Output 2250 ml  Net 1150.31 ml     Intake/Output this shift: No intake/output data recorded.  Labs: Recent Labs    07/02/19 0337  HGB 7.9*   Recent Labs    07/02/19 0337  WBC 6.0  RBC 2.96*  HCT 25.8*  PLT 117*   Recent Labs    07/02/19 0337  NA 139  K 4.3  CL 106  CO2 24  BUN 81*  CREATININE 3.22*  GLUCOSE 175*  CALCIUM 8.5*   No results for input(s): LABPT, INR in the last 72 hours.  Exam: General - Patient is Alert and Oriented Extremity - Neurologically intact Sensation intact distally Intact pulses distally Dorsiflexion/Plantar flexion intact Dressing - dressing C/D/I Motor Function - intact, moving foot and toes well on exam.   Past Medical History:  Diagnosis Date  . ALLERGIC RHINITIS   . Arthritis   . Chronic diastolic heart failure ([place]) 05/05/2013   Echo 03/2018:  EF 26-33, +diastolic dysfunction, GLS -20.5%, normal RVSF, RVSP 42.5 (mild elevated), severe LAE, small pericardial eff, mild MAc, mod AV calc, mild PI  . CKD (chronic kidney disease)   . Diabetes mellitus   . Heart murmur    at birth  . Hyperlipidemia   . Hypertension   . Hypothyroid   . Iron deficiency anemia   .  Obesity   . OSA on CPAP   . Previous back surgery    x 2  . S/P arthroscopy     Assessment/Plan: 1 Day Post-Op Procedure(s) (LRB): TOTAL KNEE ARTHROPLASTY (Left) Active Problems:   S/P left TKA   Status post total left knee replacement  Estimated body mass index is 38.94 kg/m as calculated from the following:   Height as of this encounter: _0  (1.778 m).   Weight as of this encounter: 123.1 kg. Advance diet Up with therapy D/C IV fluids   Patient's anticipated LOS is less than 2 midnights, meeting these requirements: - Younger than 60 - Lives within 1 hour of care - Has a competent adult at home to recover with post-op recover - NO history of  - Chronic pain requiring opiods  - Diabetes  - Coronary Artery Disease  - Heart failure  - Heart attack  - Stroke  - DVT/VTE  - Cardiac arrhythmia  - Respiratory Failure/COPD  - Renal failure  - Anemia  - Advanced Liver disease  DVT Prophylaxis - Aspirin Weight bearing as tolerated.  She is chronically anemic, with hemoglobin of 7.9 this morning. Plan is to go Home after hospital stay. We will order HHPT, as she does not have anyone that can transport her to [place]. She will work with PT today. If she is progressing  well and meeting her goals, we will plan for discharge home today. Follow up in the office in 2 weeks.   Griffith Citron, PA-C Orthopedic Surgery [phone] 07/02/2019, 7:34 AM

## 2019-07-02 NOTE — Progress Notes (Signed)
Physical Therapy Treatment Patient Details Name: Tiffany [name] MRN: [id number] DOB: 1950-09-11 Today's Date: 07/02/2019    History of Present Illness Patient is 69 y.o. female s/p Lt TKA on 07/01/19 with PMH significant for OSA, HTN, HLD, DM, hypothyroidism, CKD, OA, obesity, lumbar surgery x2.    PT Comments    Pt progressing well. C/o bil calf pain she feels it is from pressure from chair yesterday. Will see for a second session to review  Stairs.   Follow Up Recommendations  Follow surgeon's recommendation for DC plan and follow-up therapies;Home health PT(HHPT)     Equipment Recommendations  Rolling walker with 5" wheels    Recommendations for Other Services       Precautions / Restrictions Precautions Precautions: Fall Restrictions Weight Bearing Restrictions: No Other Position/Activity Restrictions: WBAT    Mobility  Bed Mobility Overal bed mobility: Needs Assistance Bed Mobility: Supine to Sit     Supine to sit: Min assist;HOB elevated     General bed mobility comments: cues for sequencing and use of bed rail, assist for Lt LE mobility. instructed in use of gait belt as leg lifter   Transfers Overall transfer level: Needs assistance Equipment used: Rolling walker (2 wheeled) Transfers: Sit to/from Stand Sit to Stand: Min assist;From elevated surface         General transfer comment: cues for hand placement   Ambulation/Gait Ambulation/Gait assistance: Min guard Gait Distance (Feet): 80 Feet Assistive device: Rolling walker (2 wheeled) Gait Pattern/deviations: Step-to pattern;Decreased stance time - left;Decreased weight shift to left     General Gait Details: cues for sequence and RW position, safety with turns    Stairs             Wheelchair Mobility    Modified Rankin (Stroke Patients Only)       Balance                                            Cognition Arousal/Alertness: Awake/alert Behavior During  Therapy: WFL for tasks assessed/performed Overall Cognitive Status: Within Functional Limits for tasks assessed                                        Exercises Total Joint Exercises Ankle Circles/Pumps: AROM;Both;20 reps Quad Sets: AROM;Both;10 reps Short Arc QuadSinclair Ship;Left;10 reps Heel Slides: AAROM;Left;10 reps Hip ABduction/ADduction: AROM;AAROM;Left;10 reps Straight Leg Raises: AROM;Left;10 reps Goniometric ROM: grossly  8 to 60 degrees AAROM L knee flexion    General Comments        Pertinent Vitals/Pain Pain Assessment: 0-10 Pain Score: 6  Pain Location: Lt knee and bil calves  Pain Descriptors / Indicators: Aching;Discomfort;Grimacing Pain Intervention(s): Limited activity within patient's tolerance;Monitored during session;Premedicated before session;Repositioned    Home Living                      Prior Function            PT Goals (current goals can now be found in the care plan section) Acute Rehab PT Goals Patient Stated Goal: mvoe with less pain and get back to work PT Goal Formulation: With patient Time For Goal Achievement: 07/08/19 Potential to Achieve Goals: Good Progress towards PT goals: Progressing toward goals    Frequency  7X/week      PT Plan Current plan remains appropriate    Co-evaluation              AM-PAC PT "6 Clicks" Mobility   Outcome Measure  Help needed turning from your back to your side while in a flat bed without using bedrails?: A Little Help needed moving from lying on your back to sitting on the side of a flat bed without using bedrails?: A Little Help needed moving to and from a bed to a chair (including a wheelchair)?: A Little Help needed standing up from a chair using your arms (e.g., wheelchair or bedside chair)?: A Little Help needed to walk in hospital room?: A Little Help needed climbing 3-5 steps with a railing? : A Little 6 Click Score: 18    End of Session Equipment  Utilized During Treatment: Gait belt Activity Tolerance: Patient tolerated treatment well Patient left: in chair;with call bell/phone within reach;with chair alarm set Nurse Communication: Mobility status PT Visit Diagnosis: Muscle weakness (generalized) (M62.81);Difficulty in walking, not elsewhere classified (R26.2);Pain Pain - Right/Left: Left Pain - part of body: Knee     Time: 9038-3338 PT Time Calculation (min) (ACUTE ONLY): 36 min  Charges:  $Gait Training: 8-22 mins $Therapeutic Exercise: 8-22 mins                     Yaniyah Koors, PT   Acute Rehab Dept [facility]): [phone]   07/02/2019    [facility] [place] 07/02/2019, 11:31 AM

## 2019-07-02 NOTE — Progress Notes (Signed)
Physical Therapy Treatment Patient Details Name: Tiffany [name] MRN: [id number] DOB: 01-25-1951 Today's Date: 07/02/2019    History of Present Illness Patient is 69 y.o. female s/p Lt TKA on 07/01/19 with PMH significant for OSA, HTN, HLD, DM, hypothyroidism, CKD, OA, obesity, lumbar surgery x2.    PT Comments    Pt progressing well. Reviewed bed mobility/transfers/gait and stairs with pt and her brother. Ready for d/c from PT standpoint. Will benefit from continued PT post acute    Follow Up Recommendations  Follow surgeon's recommendation for DC plan and follow-up therapies;Home health PT(HHPT)     Equipment Recommendations  Rolling walker with 5" wheels    Recommendations for Other Services       Precautions / Restrictions Precautions Precautions: Fall Restrictions Weight Bearing Restrictions: No Other Position/Activity Restrictions: WBAT    Mobility  Bed Mobility Overal bed mobility: Needs Assistance Bed Mobility: Supine to Sit;Sit to Supine     Supine to sit: Supervision;HOB elevated     General bed mobility comments: incr time, using gait belt as leg lifter, no physical assist, supervision for safety   Transfers Overall transfer level: Needs assistance Equipment used: Rolling walker (2 wheeled) Transfers: Sit to/from Stand Sit to Stand: From elevated surface;Supervision;Min guard         General transfer comment: cues for hand placement, LLE position  Ambulation/Gait Ambulation/Gait assistance: Supervision Gait Distance (Feet): 40 Feet Assistive device: Rolling walker (2 wheeled) Gait Pattern/deviations: Step-to pattern;Decreased stance time - left;Decreased weight shift to left Gait velocity: decr   General Gait Details: cues for sequence and RW position initially    Stairs Stairs: Yes Stairs assistance: Min guard Stair Management: Two rails;Forwards;Step to pattern Number of Stairs: 5(x2) General stair comments: cues for sequence and  technique, brother present for session, assisting second trial   Wheelchair Mobility    Modified Rankin (Stroke Patients Only)       Balance                                            Cognition Arousal/Alertness: Awake/alert Behavior During Therapy: WFL for tasks assessed/performed Overall Cognitive Status: Within Functional Limits for tasks assessed                                        Exercises      General Comments        Pertinent Vitals/Pain Pain Assessment: 0-10 Pain Score: 3  Pain Location: Lt knee  Pain Descriptors / Indicators: Aching;Discomfort;Grimacing Pain Intervention(s): Limited activity within patient's tolerance;Monitored during session;Premedicated before session;Repositioned    Home Living                      Prior Function            PT Goals (current goals can now be found in the care plan section) Acute Rehab PT Goals Patient Stated Goal: mvoe with less pain and get back to work PT Goal Formulation: With patient Time For Goal Achievement: 07/08/19 Potential to Achieve Goals: Good Progress towards PT goals: Progressing toward goals    Frequency    7X/week      PT Plan Current plan remains appropriate    Co-evaluation  AM-PAC PT "6 Clicks" Mobility   Outcome Measure  Help needed turning from your back to your side while in a flat bed without using bedrails?: A Little Help needed moving from lying on your back to sitting on the side of a flat bed without using bedrails?: A Little Help needed moving to and from a bed to a chair (including a wheelchair)?: A Little Help needed standing up from a chair using your arms (e.g., wheelchair or bedside chair)?: A Little Help needed to walk in hospital room?: A Little Help needed climbing 3-5 steps with a railing? : A Little 6 Click Score: 18    End of Session Equipment Utilized During Treatment: Gait belt Activity  Tolerance: Patient tolerated treatment well Patient left: in chair;with call bell/phone within reach;with chair alarm set Nurse Communication: Mobility status PT Visit Diagnosis: Muscle weakness (generalized) (M62.81);Difficulty in walking, not elsewhere classified (R26.2);Pain Pain - Right/Left: Left Pain - part of body: Knee     Time: 1447-1511 PT Time Calculation (min) (ACUTE ONLY): 24 min  Charges:  $Gait Training: 23-37 mins                     Kenai Fluegel, PT   Acute Rehab Dept [facility]): [phone]   07/02/2019    [facility] 07/02/2019, 3:20 PM

## 2019-07-02 NOTE — TOC Transition Note (Signed)
Transition of Care [facility]) - CM/SW Discharge Note   Patient Details  Name: Tiffany [name] MRN: [id number] Date of Birth: April 18, 1950  [facility] CM/SW Contact:  Lennart Pall, LCSW Phone Number: 07/02/2019, 11:56 AM   Clinical Narrative:   Met with pt to review d/c needs.  She has received her RW (already has bsc). Pt had previously discussed with PA the need to changed from OPPT to [place] and orders in.  Referral placed with [facility].  No further TOC needs at this time.    Final next level of care: [place] Barriers to Discharge: No Barriers Identified   Patient Goals and CMS Choice Patient states their goals for this hospitalization and ongoing recovery are:: hopes for home today CMS Medicare.gov Compare Post Acute Care list provided to:: Patient Choice offered to / list presented to : Patient  Discharge Placement                       Discharge Plan and Services                DME Arranged: Walker rolling(arranged PTA via MD office) DME Agency: [employer]       HH Arranged: PT [place] Agency: [place] Date [facility] Agency Contacted: 07/02/19 Time [place]: [id number] Representative spoke with at [place]: Adela Lank  Social Determinants of Health ([place]) Interventions     Readmission Risk Interventions No flowsheet data found.

## 2019-07-05 IMAGING — DX RIGHT SHOULDER - 2+ VIEW
4 series · 4 of 4 positions shown · non-contrast
Comparison: 03/12/2018

CLINICAL DATA: Fall 2 months ago with continued shoulder pain

EXAM:
RIGHT SHOULDER - 2+ VIEW

[shoulder ap]
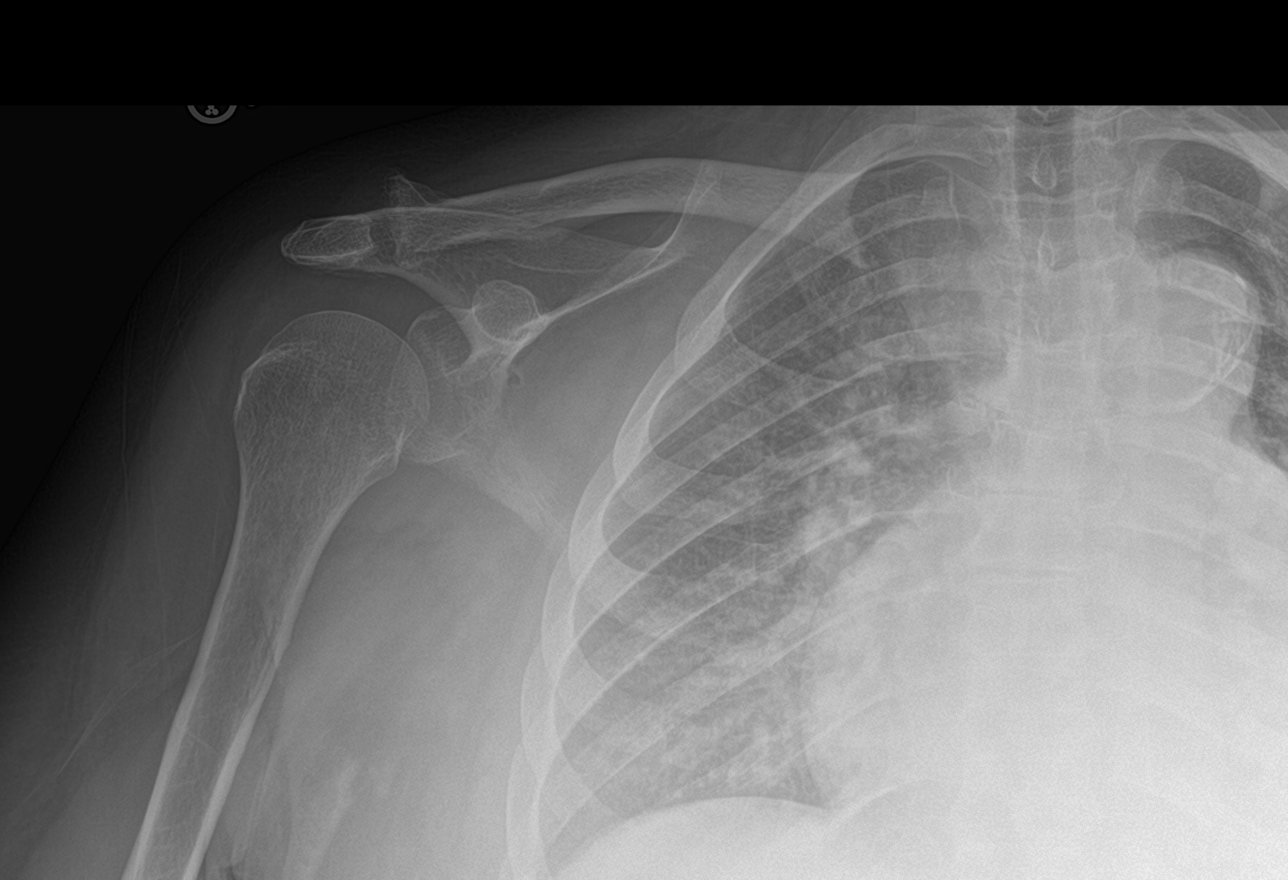

[shoulder grashey]
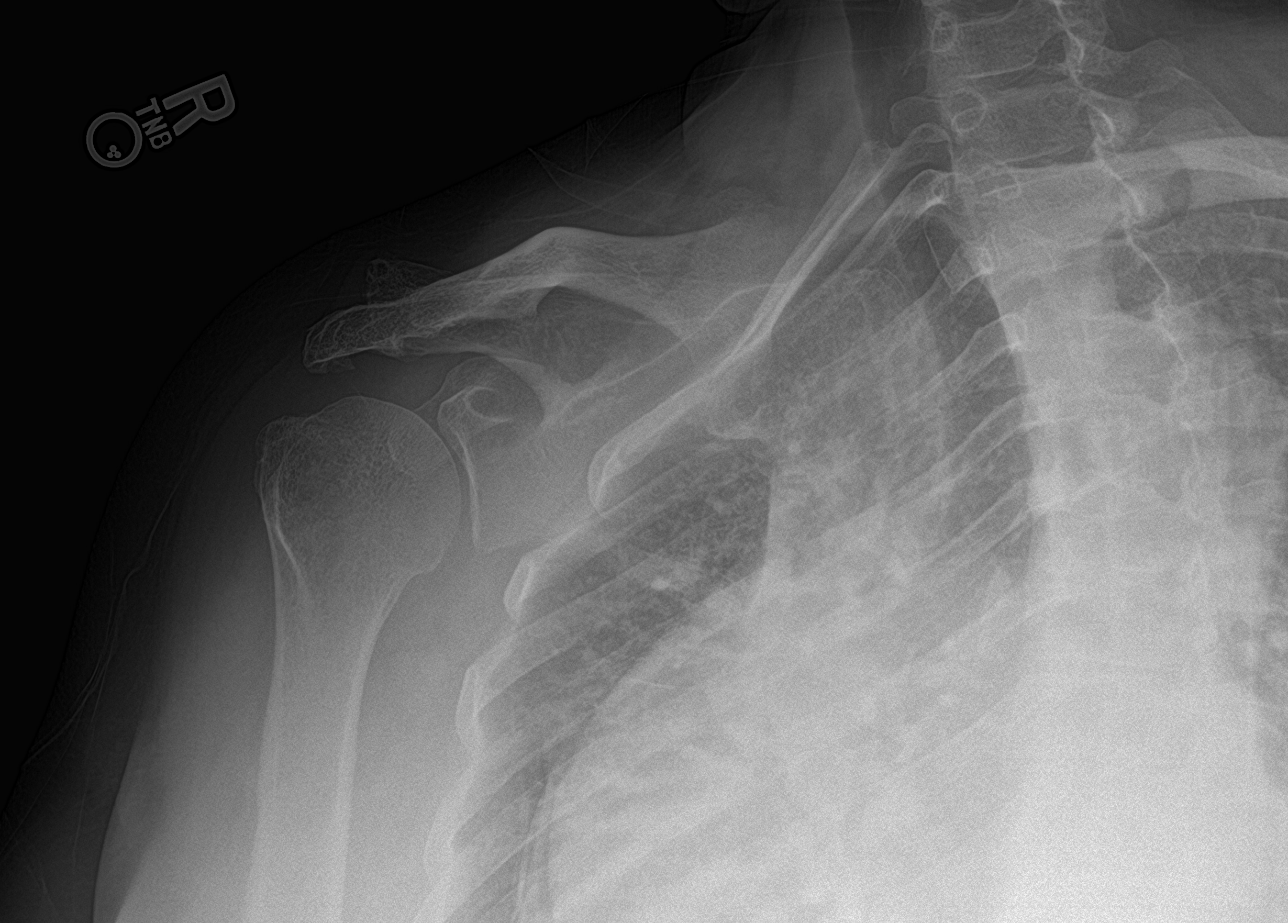

[shoulder y-view]
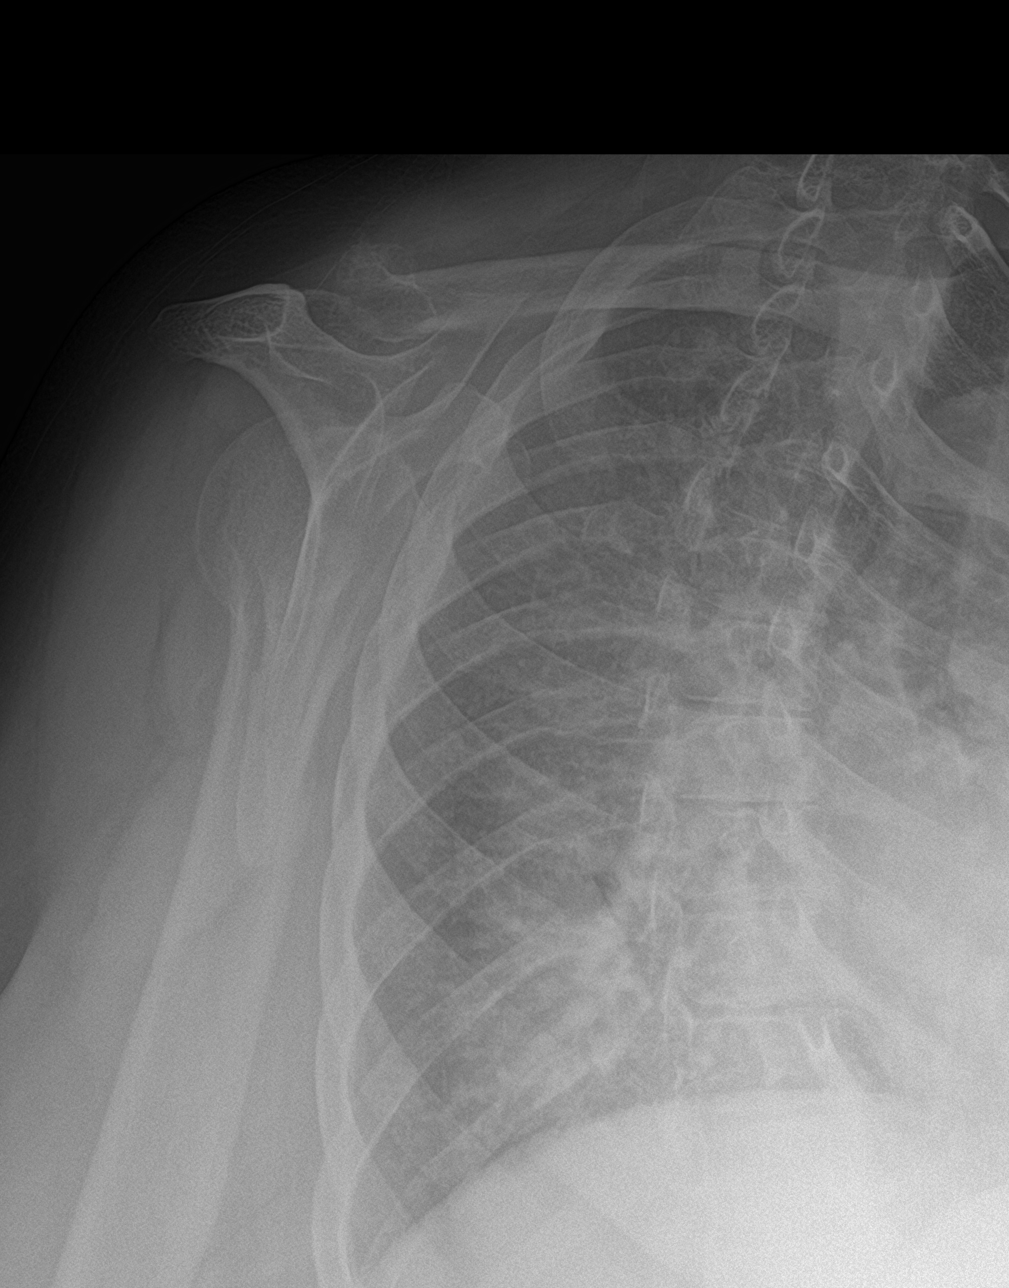

[shoulder axial]
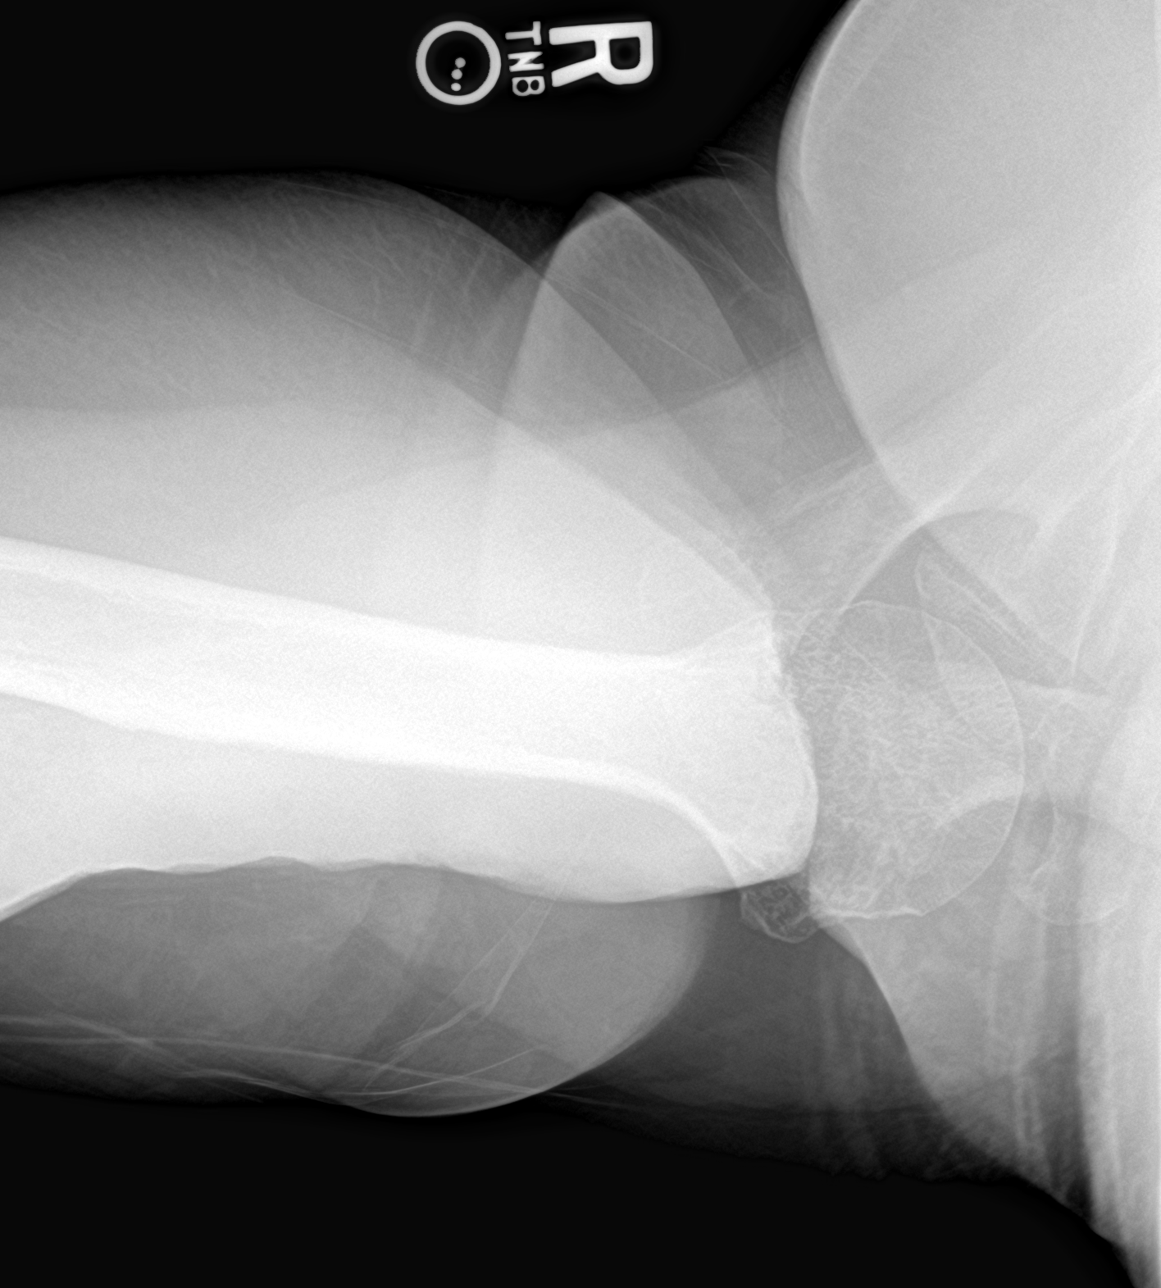

[4 of 4 positions shown; findings below may reference images not displayed]

FINDINGS: Degenerative spurring, mainly upward pointing, at the
acromioclavicular joint without offset. No fracture deformity or
glenohumeral malalignment.

Cardiomegaly.
IMPRESSION: 1. No acute finding.
2. Degenerative spurring at the acromioclavicular joint.

## 2019-07-07 NOTE — Discharge Summary (Signed)
Physician Discharge Summary   Patient ID: Tiffany [name] MRN: [id number] DOB/AGE: [birth year] 68 y.o.  Admit date: 07/01/2019 Discharge date: 07/02/2019  Primary Diagnosis: Left knee osteoarthritis  Admission Diagnoses:  Past Medical History:  Diagnosis Date   ALLERGIC RHINITIS    Arthritis    Chronic diastolic heart failure ([place]) 05/05/2013   Echo 03/2018:  EF 12-75, +diastolic dysfunction, GLS -20.5%, normal RVSF, RVSP 42.5 (mild elevated), severe LAE, small pericardial eff, mild MAc, mod AV calc, mild PI   CKD (chronic kidney disease)    Diabetes mellitus    Heart murmur    at birth   Hyperlipidemia    Hypertension    Hypothyroid    Iron deficiency anemia    Obesity    OSA on CPAP    Previous back surgery    x 2   S/P arthroscopy    Discharge Diagnoses:   Active Problems:   S/P left TKA   Status post total left knee replacement  Estimated body mass index is 38.94 kg/m as calculated from the following:   Height as of this encounter: _0  (1.778 m).   Weight as of this encounter: 123.1 kg.  Procedure:  Procedure(s) (LRB): TOTAL KNEE ARTHROPLASTY (Left)   Consults: None  HPI: Tiffany [name] is a 69 y.o. female patient of   mine.  The patient had been seen, evaluated, and treated for months conservatively in the office with medication, activity modification, and injections.  The patient had   radiographic changes of bone-on-bone arthritis with endplate sclerosis and osteophytes noted.  Based on the radiographic changes and failed conservative measures, the patient decided to proceed with definitive treatment, total knee replacement.  Risks of infection, DVT, component failure, need for revision surgery, neurovascular injury were reviewed in the office setting.  The postop course was reviewed stressing the efforts to maximize post-operative satisfaction and function.  Consent was obtained for benefit of pain   relief.   Laboratory Data: Admission on  07/01/2019, Discharged on 07/02/2019  Component Date Value Ref Range Status   Glucose-Capillary 07/01/2019 79  70 - 99 mg/dL Final   Glucose reference range applies only to samples taken after fasting for at least 8 hours.   Glucose-Capillary 07/01/2019 105* 70 - 99 mg/dL Final   Glucose reference range applies only to samples taken after fasting for at least 8 hours.   Glucose-Capillary 07/01/2019 118* 70 - 99 mg/dL Final   Glucose reference range applies only to samples taken after fasting for at least 8 hours.   Glucose-Capillary 07/01/2019 247* 70 - 99 mg/dL Final   Glucose reference range applies only to samples taken after fasting for at least 8 hours.   WBC 07/02/2019 6.0  4.0 - 10.5 K/uL Final   RBC 07/02/2019 2.96* 3.87 - 5.11 MIL/uL Final   Hemoglobin 07/02/2019 7.9* 12.0 - 15.0 g/dL Final   HCT 07/02/2019 25.8* 36.0 - 46.0 % Final   MCV 07/02/2019 87.2  80.0 - 100.0 fL Final   MCH 07/02/2019 26.7  26.0 - 34.0 pg Final   MCHC 07/02/2019 30.6  30.0 - 36.0 g/dL Final   RDW 07/02/2019 18.4* 11.5 - 15.5 % Final   Platelets 07/02/2019 117* 150 - 400 K/uL Final   Comment: SPECIMEN CHECKED FOR CLOTS Immature Platelet Fraction may be clinically indicated, consider ordering this additional test [id number] PLATELET COUNT CONFIRMED BY SMEAR REPEATED TO VERIFY    nRBC 07/02/2019 0.0  0.0 - 0.2 % Final   Performed at  [facility], [place] [address] [place], [place] [address]   Sodium 07/02/2019 139  135 - 145 mmol/L Final   Potassium 07/02/2019 4.3  3.5 - 5.1 mmol/L Final   Chloride 07/02/2019 106  98 - 111 mmol/L Final   CO2 07/02/2019 24  22 - 32 mmol/L Final   Glucose, Bld 07/02/2019 175* 70 - 99 mg/dL Final   Glucose reference range applies only to samples taken after fasting for at least 8 hours.   BUN 07/02/2019 81* 8 - 23 mg/dL Final   Creatinine, Ser 07/02/2019 3.22* 0.44 - 1.00 mg/dL Final   Calcium 07/02/2019 8.5* 8.9 - 10.3 mg/dL Final    GFR calc non Af Amer 07/02/2019 14* >60 mL/min Final   GFR calc Af Amer 07/02/2019 16* >60 mL/min Final   Anion gap 07/02/2019 9  5 - 15 Final   Performed at [facility], [place] [address], [place], [place] [address]   Glucose-Capillary 07/01/2019 200* 70 - 99 mg/dL Final   Glucose reference range applies only to samples taken after fasting for at least 8 hours.   Glucose-Capillary 07/02/2019 111* 70 - 99 mg/dL Final   Glucose reference range applies only to samples taken after fasting for at least 8 hours.   Glucose-Capillary 07/02/2019 137* 70 - 99 mg/dL Final   Glucose reference range applies only to samples taken after fasting for at least 8 hours.  Hospital Outpatient Visit on 06/30/2019  Component Date Value Ref Range Status   SARS Coronavirus 2 06/30/2019 NEGATIVE  NEGATIVE Final   Comment: (NOTE) SARS-CoV-2 target nucleic acids are NOT DETECTED. The SARS-CoV-2 RNA is generally detectable in upper and lower respiratory specimens during the acute phase of infection. Negative results do not preclude SARS-CoV-2 infection, do not rule out co-infections with other pathogens, and should not be used as the sole basis for treatment or other patient management decisions. Negative results must be combined with clinical observations, patient history, and epidemiological information. The expected result is Negative. Fact Sheet for Patients: SugarRoll.be Fact Sheet for Healthcare Providers: https://www.woods-mathews.com/ This test is not yet approved or cleared by the [place] FDA and  has been authorized for detection and/or diagnosis of SARS-CoV-2 by FDA under an Emergency Use Authorization (EUA). This EUA will remain  in effect (meaning this test can be used) for the duration of the COVID-19 declaration under Section 56                          4(b)(1) of the Act, 21 U.S.C. section 360bbb-3(b)(1), unless the  authorization is terminated or revoked sooner. Performed at [facility], [place] [address] [place], [place] [address]   Hospital Outpatient Visit on 06/25/2019  Component Date Value Ref Range Status   Hemoglobin 06/25/2019 9.5* 12.0 - 15.0 g/dL Final  Hospital Outpatient Visit on 06/23/2019  Component Date Value Ref Range Status   MRSA, PCR 06/23/2019 NEGATIVE  NEGATIVE Final   Staphylococcus aureus 06/23/2019 POSITIVE* NEGATIVE Final   Comment: (NOTE) The Xpert SA Assay (FDA approved for NASAL specimens in patients 52 years of age and older), is one component of a comprehensive surveillance program. It is not intended to diagnose infection nor to guide or monitor treatment. Performed at [facility], [place] [address], [place], [place] [address]    Sodium 06/23/2019 143  135 - 145 mmol/L Final   Potassium 06/23/2019 4.3  3.5 - 5.1 mmol/L Final  Chloride 06/23/2019 108  98 - 111 mmol/L Final   CO2 06/23/2019 21* 22 - 32 mmol/L Final   Glucose, Bld 06/23/2019 77  70 - 99 mg/dL Final   Glucose reference range applies only to samples taken after fasting for at least 8 hours.   BUN 06/23/2019 102* 8 - 23 mg/dL Final   RESULTS CONFIRMED BY MANUAL DILUTION   Creatinine, Ser 06/23/2019 3.09* 0.44 - 1.00 mg/dL Final   Calcium 06/23/2019 9.2  8.9 - 10.3 mg/dL Final   GFR calc non Af Amer 06/23/2019 15* >60 mL/min Final   GFR calc Af Amer 06/23/2019 17* >60 mL/min Final   Anion gap 06/23/2019 14  5 - 15 Final   Performed at [facility], [place] [address] [place], [place] [address]   WBC 06/23/2019 5.4  4.0 - 10.5 K/uL Final   RBC 06/23/2019 3.56* 3.87 - 5.11 MIL/uL Final   Hemoglobin 06/23/2019 9.3* 12.0 - 15.0 g/dL Final   HCT 06/23/2019 30.6* 36.0 - 46.0 % Final   MCV 06/23/2019 86.0  80.0 - 100.0 fL Final   MCH 06/23/2019 26.1  26.0 - 34.0 pg Final   MCHC 06/23/2019 30.4  30.0 - 36.0 g/dL Final   RDW 06/23/2019 18.6* 11.5  - 15.5 % Final   Platelets 06/23/2019 159  150 - 400 K/uL Final   nRBC 06/23/2019 0.0  0.0 - 0.2 % Final   Performed at [facility], [place] [address] [place], [place] [address]   Hgb A1c MFr Bld 06/23/2019 6.3* 4.8 - 5.6 % Final   Comment: (NOTE)         Prediabetes: 5.7 - 6.4         Diabetes: >6.4         Glycemic control for adults with diabetes: <7.0    Mean Plasma Glucose 06/23/2019 134  mg/dL Final   Comment: (NOTE) Performed At: [facility] [place], [place] [id number] Rush Farmer MD ES:[id number]    Glucose-Capillary 06/23/2019 69* 70 - 99 mg/dL Final   Glucose reference range applies only to samples taken after fasting for at least 8 hours.  Hospital Outpatient Visit on 06/11/2019  Component Date Value Ref Range Status   Iron 06/11/2019 69  28 - 170 ug/dL Final   TIBC 06/11/2019 302  250 - 450 ug/dL Final   Saturation Ratios 06/11/2019 23  10.4 - 31.8 % Final   UIBC 06/11/2019 233  ug/dL Final   Performed at [facility], [place] [address], [place], [place] [address]   Ferritin 06/11/2019 582* 11 - 307 ng/mL Final   Performed at [place] [address] [place], [place] [address]   Hemoglobin 06/11/2019 9.8* 12.0 - 15.0 g/dL Final  Hospital Outpatient Visit on 05/28/2019  Component Date Value Ref Range Status   Hemoglobin 05/28/2019 10.1* 12.0 - 15.0 g/dL Final  Hospital Outpatient Visit on 05/14/2019  Component Date Value Ref Range Status   Iron 05/14/2019 74  28 - 170 ug/dL Final   TIBC 05/14/2019 300  250 - 450 ug/dL Final   Saturation Ratios 05/14/2019 25  10.4 - 31.8 % Final   UIBC 05/14/2019 226  ug/dL Final   Performed at [facility], [place] [address], [place], [place] [address]   Ferritin 05/14/2019 589* 11 - 307 ng/mL Final   Performed at [place] [address], [place], [place] [address]   Hemoglobin 05/14/2019 10.2* 12.0 -  15.0 g/dL Final     X-Rays:No results  found.  EKG: Orders placed or performed in visit on 05/12/19   EKG 12-Lead     Hospital Course: Tiffany [name] is a 69 y.o. who was admitted to [facility]. They were brought to the operating room on 07/01/2019 and underwent Procedure(s): TOTAL KNEE ARTHROPLASTY.  Patient tolerated the procedure well and was later transferred to the recovery room and then to the orthopaedic floor for postoperative care. They were given PO and IV analgesics for pain control following their surgery. They were given 24 hours of postoperative antibiotics of  Anti-infectives (From admission, onward)   Start     Dose/Rate Route Frequency Ordered Stop   07/01/19 1415  ceFAZolin (ANCEF) IVPB 2g/100 mL premix     2 g 200 mL/hr over 30 Minutes Intravenous Every 6 hours 07/01/19 1124 07/01/19 2021   07/01/19 0600  ceFAZolin (ANCEF) 3 g in dextrose 5 % 50 mL IVPB     3 g 100 mL/hr over 30 Minutes Intravenous On call to O.R. 06/30/19 0454 07/01/19 0757     and started on DVT prophylaxis in the form of Aspirin.   PT and OT were ordered for total joint protocol. Discharge planning consulted to help with postop disposition and equipment needs. Patient had a good night on the evening of surgery. They started to get up OOB with therapy on POD #0.  Continued to work with therapy into POD #2. Pt was seen during rounds on day two and was ready to go home pending progress with therapy.  Pt worked with therapy for two additional sessions and was meeting their goals. She was discharged to home later that day in stable condition.  Diet: Regular diet Activity: WBAT Follow-up: in 2 weeks Disposition: Home Discharged Condition: good   Discharge Instructions    Call MD / Call 911   Complete by: As directed    If you experience chest pain or shortness of breath, CALL 911 and be transported to the hospital emergency room.  If you develope a fever above 101 F, pus (white drainage) or increased drainage or redness at the  wound, or calf pain, call your surgeon's office.   Change dressing   Complete by: As directed    Maintain surgical dressing until follow up in the clinic. If the edges start to pull up, may reinforce with tape. If the dressing is no longer working, may remove and cover with gauze and tape, but must keep the area dry and clean.  Call with any questions or concerns.   Constipation Prevention   Complete by: As directed    Drink plenty of fluids.  Prune juice may be helpful.  You may use a stool softener, such as Colace (over the counter) 100 mg twice a day.  Use MiraLax (over the counter) for constipation as needed.   Diet - low sodium heart healthy   Complete by: As directed    Discharge instructions   Complete by: As directed    Maintain surgical dressing until follow up in the clinic. If the edges start to pull up, may reinforce with tape. If the dressing is no longer working, may remove and cover with gauze and tape, but must keep the area dry and clean.  Follow up in 2 weeks at [facility]. Call with any questions or concerns.   Increase activity slowly as tolerated   Complete by: As directed    Weight bearing as tolerated with  assist device (walker, cane, etc) as directed, use it as long as suggested by your surgeon or therapist, typically at least 4-6 weeks.   TED hose   Complete by: As directed    Use stockings (TED hose) for 2 weeks on both leg(s).  You may remove them at night for sleeping.     Allergies as of 07/02/2019   No Known Allergies     Medication List    STOP taking these medications   acetaminophen 500 MG tablet Commonly known as: TYLENOL   aspirin EC 81 MG tablet Replaced by: aspirin 81 MG chewable tablet   diclofenac sodium 1 % Gel Commonly known as: VOLTAREN     TAKE these medications   albuterol 108 (90 Base) MCG/ACT inhaler Commonly known as: VENTOLIN HFA Inhale 1-2 puffs into the lungs every 6 (six) hours as needed for wheezing or shortness of breath.     allopurinol 100 MG tablet Commonly known as: ZYLOPRIM Take 100 mg by mouth 2 (two) times daily.   amLODipine 10 MG tablet Commonly known as: NORVASC Take 10 mg by mouth 2 (two) times daily.   aspirin 81 MG chewable tablet Commonly known as: Aspirin Childrens Chew 1 tablet (81 mg total) by mouth 2 (two) times daily. Take for 4 weeks, then resume regular dose. Replaces: aspirin EC 81 MG tablet   B COMPLEX PO Take 1 tablet by mouth daily.   B-D INS SYRINGE 0.5CC/30GX1/2" 30G X 1/2" 0.5 ML Misc Generic drug: Insulin Syringe-Needle U-100 TAKE AS DIRECTED TWICE A DAY   BIOTIN 5000 PO Take 5,000 mcg by mouth daily.   calcitRIOL 0.25 MCG capsule Commonly known as: ROCALTROL Take 0.25 mcg by mouth daily at 6 PM. (1700)   carvedilol 6.25 MG tablet Commonly known as: COREG Take 6.25 mg by mouth in the morning and at bedtime.   cetirizine 10 MG tablet Commonly known as: ZYRTEC Take 10 mg by mouth daily as needed for allergies.   cloNIDine 0.3 mg/24hr patch Commonly known as: CATAPRES - Dosed in mg/24 hr Place 0.3 mg onto the skin every Friday.   docusate sodium 100 MG capsule Commonly known as: Colace Take 1 capsule (100 mg total) by mouth 2 (two) times daily.   ferrous sulfate 325 (65 FE) MG tablet Commonly known as: FerrouSul Take 1 tablet (325 mg total) by mouth 3 (three) times daily with meals for 14 days.   Fish Oil 500 MG Caps Take 500 mg by mouth daily in the afternoon.   HYDROcodone-acetaminophen 7.5-325 MG tablet Commonly known as: Norco Take 1-2 tablets by mouth every 4 (four) hours as needed for moderate pain.   insulin glargine 100 UNIT/ML injection Commonly known as: LANTUS Inject 18 Units into the skin at bedtime.   Levo-T 112 MCG tablet Generic drug: levothyroxine Take 224-336 mcg by mouth See admin instructions. Take 3 tablets (336 mcg) by mouth on Sundays at 2200 & take 2 tablets (224 mcg) by mouth on Mondays, Tuesdays, Wednesdays, Thursdays,  Fridays, & Saturdays at 2200.   methocarbamol 500 MG tablet Commonly known as: Robaxin Take 1 tablet (500 mg total) by mouth every 6 (six) hours as needed for muscle spasms.   polyethylene glycol 17 g packet Commonly known as: MIRALAX / GLYCOLAX Take 17 g by mouth 2 (two) times daily.   PRESERVISION AREDS PO Take 1 tablet by mouth in the morning and at bedtime.   rosuvastatin 10 MG tablet Commonly known as: CRESTOR Take 10 mg by mouth  every evening.   Systane Complete 0.6 % Soln Generic drug: Propylene Glycol Place 1 drop into both eyes in the morning, at noon, in the evening, and at bedtime.   torsemide 100 MG tablet Commonly known as: DEMADEX Take 100-150 mg by mouth See admin instructions. Alternating between 1 tablet (100 mg) & 1.5 tablets (150 mg) every other day   zolpidem 10 MG tablet Commonly known as: AMBIEN TAKE 1 TABLET BY MOUTH EVERY DAY AT BEDTIME AS NEEDED FOR SLEEP What changed: See the new instructions.            Discharge Care Instructions  (From admission, onward)         Start     Ordered   07/02/19 0000  Change dressing    Comments: Maintain surgical dressing until follow up in the clinic. If the edges start to pull up, may reinforce with tape. If the dressing is no longer working, may remove and cover with gauze and tape, but must keep the area dry and clean.  Call with any questions or concerns.   07/02/19 9767         Follow-up Information    Paralee Cancel, MD. Schedule an appointment as soon as possible for a visit in 2 weeks.   Specialty: Orthopedic Surgery Contact information: [address] [place] [address] [phone]        Care, [facility] Follow up.   Specialty: Home Health Services Why: will provide home health physical therapy Contact information: [place] [address] [phone]           Signed: Griffith Citron, PA-C Orthopedic Surgery 07/07/2019, 8:02  AM

## 2019-07-21 ENCOUNTER — Telehealth: Payer: Self-pay | Admitting: Internal Medicine

## 2019-07-21 ENCOUNTER — Ambulatory Visit (HOSPITAL_COMMUNITY)
Admission: RE | Admit: 2019-07-21 | Discharge: 2019-07-21 | Disposition: A | Discharge status: Home / Self Care | Payer: Medicare Other | Source: Ambulatory Visit | Attending: Internal Medicine | Admitting: Internal Medicine

## 2019-07-21 ENCOUNTER — Other Ambulatory Visit: Payer: Self-pay

## 2019-07-21 VITALS — BP 150/76 | HR 58 | Temp 96.0°F

## 2019-07-21 DIAGNOSIS — D631 Anemia in chronic kidney disease: Secondary | ICD-10-CM | POA: Insufficient documentation

## 2019-07-21 DIAGNOSIS — N183 Chronic kidney disease, stage 3 unspecified: Secondary | ICD-10-CM

## 2019-07-21 DIAGNOSIS — N184 Chronic kidney disease, stage 4 (severe): Secondary | ICD-10-CM | POA: Insufficient documentation

## 2019-07-21 LAB — FERRITIN: Ferritin: 693 ng/mL — ABNORMAL HIGH (ref 11–307)

## 2019-07-21 LAB — IRON AND TIBC
Iron: 48 ug/dL (ref 28–170)
Saturation Ratios: 17 % (ref 10.4–31.8)
TIBC: 284 ug/dL (ref 250–450)
UIBC: 236 ug/dL

## 2019-07-21 LAB — POCT HEMOGLOBIN-HEMACUE: Hemoglobin: 8.7 g/dL — ABNORMAL LOW (ref 12.0–15.0)

## 2019-07-21 MED ORDER — EPOETIN ALFA-EPBX 10000 UNIT/ML IJ SOLN
INTRAMUSCULAR | Status: AC
  Administered 2019-07-21: 20000 [IU] via SUBCUTANEOUS
  Filled 2019-07-21: qty 2

## 2019-07-21 MED ORDER — EPOETIN ALFA-EPBX 10000 UNIT/ML IJ SOLN: 20000.0000 [IU] | INTRAMUSCULAR | Status: DC

## 2019-07-21 NOTE — Telephone Encounter (Signed)
Not seen in over a year will need appt- LMTCB x 1

## 2019-07-22 NOTE — Telephone Encounter (Signed)
Called patient to schedule appointment, however, she stated she had already been contacted on 07/21/2019.  Apologized for duplicate call.  This has already been followed up on.

## 2019-08-03 ENCOUNTER — Other Ambulatory Visit (HOSPITAL_COMMUNITY): Payer: Self-pay | Admitting: *Deleted

## 2019-08-04 ENCOUNTER — Ambulatory Visit (HOSPITAL_COMMUNITY)
Admission: RE | Admit: 2019-08-04 | Discharge: 2019-08-04 | Disposition: A | Discharge status: Home / Self Care | Payer: Medicare Other | Source: Ambulatory Visit | Attending: Internal Medicine | Admitting: Internal Medicine

## 2019-08-04 ENCOUNTER — Other Ambulatory Visit: Payer: Self-pay

## 2019-08-04 VITALS — BP 161/68 | HR 69 | Temp 97.6°F

## 2019-08-04 DIAGNOSIS — N183 Chronic kidney disease, stage 3 unspecified: Secondary | ICD-10-CM | POA: Diagnosis not present

## 2019-08-04 LAB — RENAL FUNCTION PANEL
Albumin: 3.4 g/dL — ABNORMAL LOW (ref 3.5–5.0)
Anion gap: 11 (ref 5–15)
BUN: 76 mg/dL — ABNORMAL HIGH (ref 8–23)
CO2: 23 mmol/L (ref 22–32)
Calcium: 9.2 mg/dL (ref 8.9–10.3)
Chloride: 104 mmol/L (ref 98–111)
Creatinine, Ser: 3.08 mg/dL — ABNORMAL HIGH (ref 0.44–1.00)
GFR calc Af Amer: 17 mL/min — ABNORMAL LOW (ref >60)
GFR calc non Af Amer: 15 mL/min — ABNORMAL LOW (ref >60)
Glucose, Bld: 132 mg/dL — ABNORMAL HIGH (ref 70–99)
Phosphorus: 4.4 mg/dL (ref 2.5–4.6)
Potassium: 4.2 mmol/L (ref 3.5–5.1)
Sodium: 138 mmol/L (ref 135–145)

## 2019-08-04 LAB — POCT HEMOGLOBIN-HEMACUE: Hemoglobin: 9.3 g/dL — ABNORMAL LOW (ref 12.0–15.0)

## 2019-08-04 MED ORDER — EPOETIN ALFA-EPBX 10000 UNIT/ML IJ SOLN
INTRAMUSCULAR | Status: AC
  Administered 2019-08-04: 20000 [IU] via SUBCUTANEOUS
  Filled 2019-08-04: qty 2

## 2019-08-04 MED ORDER — EPOETIN ALFA-EPBX 10000 UNIT/ML IJ SOLN: 20000.0000 [IU] | INTRAMUSCULAR | Status: DC

## 2019-08-13 ENCOUNTER — Telehealth: Payer: Self-pay

## 2019-08-13 NOTE — Telephone Encounter (Signed)
Patients last injection was 02/18/19 - Lumbar ESI (interlam)  Please advise.

## 2019-08-13 NOTE — Telephone Encounter (Signed)
Pt had Rt L5-S1 IL 02/18/19. Pt would like to have a repeat injection. Ok to schedule? Pt confirmed no new injuries/traumas and last injection helped. Please Advise

## 2019-08-13 NOTE — Telephone Encounter (Signed)
Patient called in to sch an appointment with dr newton.

## 2019-08-16 NOTE — Telephone Encounter (Signed)
Scheduled for 7/13 at 1545 with driver.

## 2019-08-16 NOTE — Telephone Encounter (Signed)
ok 

## 2019-08-17 ENCOUNTER — Ambulatory Visit: Payer: Self-pay

## 2019-08-17 ENCOUNTER — Other Ambulatory Visit: Payer: Self-pay

## 2019-08-17 ENCOUNTER — Ambulatory Visit: Payer: Medicare Other | Admitting: Physical Medicine and Rehabilitation

## 2019-08-17 ENCOUNTER — Encounter: Payer: Self-pay | Admitting: Physical Medicine and Rehabilitation

## 2019-08-17 ENCOUNTER — Other Ambulatory Visit (HOSPITAL_COMMUNITY): Payer: Self-pay | Admitting: *Deleted

## 2019-08-17 VITALS — BP 156/73 | HR 60

## 2019-08-17 DIAGNOSIS — M5416 Radiculopathy, lumbar region: Secondary | ICD-10-CM

## 2019-08-17 DIAGNOSIS — M961 Postlaminectomy syndrome, not elsewhere classified: Secondary | ICD-10-CM | POA: Diagnosis not present

## 2019-08-17 MED ORDER — METHYLPREDNISOLONE ACETATE 80 MG/ML IJ SUSP
80.0000 mg | Freq: Once | INTRAMUSCULAR | Status: AC
  Administered 2019-08-17: 80 mg

## 2019-08-17 NOTE — Progress Notes (Signed)
Pt states pain in the left lower back. Pt states sitting and walking makes the pain worse. Pt states rest and medicine makes the pain feel better. Pt states that her back pain started more after her knee surgery on May of 2021. Pt hx inj on 02/18/19.  Numeric Pain Rating Scale and Functional Assessment Average Pain 8   In the last MONTH (on 0-10 scale) has pain interfered with the following?  1. General activity like being  able to carry out your everyday physical activities such as walking, climbing stairs, carrying groceries, or moving a chair?  Rating(8)   +Driver, -BT, -Dye Allergies.

## 2019-08-18 ENCOUNTER — Encounter (HOSPITAL_COMMUNITY)
Admission: RE | Admit: 2019-08-18 | Discharge: 2019-08-18 | Disposition: A | Discharge status: Home / Self Care | Payer: Medicare Other | Source: Ambulatory Visit | Attending: Internal Medicine | Admitting: Internal Medicine

## 2019-08-18 ENCOUNTER — Encounter (HOSPITAL_COMMUNITY): Payer: Medicare Other

## 2019-08-18 VITALS — BP 152/68 | HR 58 | Temp 97.6°F | Resp 18

## 2019-08-18 DIAGNOSIS — N183 Chronic kidney disease, stage 3 unspecified: Secondary | ICD-10-CM | POA: Insufficient documentation

## 2019-08-18 LAB — POCT HEMOGLOBIN-HEMACUE: Hemoglobin: 9.5 g/dL — ABNORMAL LOW (ref 12.0–15.0)

## 2019-08-18 MED ORDER — EPOETIN ALFA-EPBX 10000 UNIT/ML IJ SOLN: 20000.0000 [IU] | INTRAMUSCULAR | Status: DC

## 2019-08-18 MED ORDER — SODIUM CHLORIDE 0.9 % IV SOLN
510.0000 mg | INTRAVENOUS | Status: DC
  Administered 2019-08-18: 510 mg via INTRAVENOUS
  Filled 2019-08-18: qty 17

## 2019-08-18 MED ORDER — EPOETIN ALFA-EPBX 10000 UNIT/ML IJ SOLN
INTRAMUSCULAR | Status: AC
  Administered 2019-08-18: 20000 [IU] via SUBCUTANEOUS
  Filled 2019-08-18: qty 2

## 2019-08-19 NOTE — Procedures (Signed)
Lumbar Epidural Steroid Injection - Interlaminar Approach with Fluoroscopic Guidance  Patient: Tiffany [name]      Date of Birth: 08/09/1950 MRN: [id number] PCP: Nolene Ebbs, MD      Visit Date: 08/17/2019   Universal Protocol:     Consent Given By: the patient  Position: PRONE  Additional Comments: Vital signs were monitored before and after the procedure. Patient was prepped and draped in the usual sterile fashion. The correct patient, procedure, and site was verified.   Injection Procedure Details:  Procedure Site One Meds Administered:  Meds ordered this encounter  Medications  . methylPREDNISolone acetate (DEPO-MEDROL) injection 80 mg     Laterality: Left  Location/Site:  L5-S1  Needle size: 20 G, 4.5 in  Needle type: Tuohy  Needle Placement: Paramedian epidural  Findings:   -Comments: Excellent flow of contrast into the epidural space.  Procedure Details: Using a paramedian approach from the side mentioned above, the region overlying the inferior lamina was localized under fluoroscopic visualization and the soft tissues overlying this structure were infiltrated with 4 ml. of 1% Lidocaine without Epinephrine. The Tuohy needle was inserted into the epidural space using a paramedian approach.   The epidural space was localized using loss of resistance along with lateral and bi-planar fluoroscopic views.  After negative aspirate for air, blood, and CSF, a 2 ml. volume of Isovue-250 was injected into the epidural space and the flow of contrast was observed. Radiographs were obtained for documentation purposes.    The injectate was administered into the level noted above.   Additional Comments:  The patient tolerated the procedure well Dressing: 2 x 2 sterile gauze and Band-Aid    Post-procedure details: Patient was observed during the procedure. Post-procedure instructions were reviewed.  Patient left the clinic in stable condition.

## 2019-08-19 NOTE — Progress Notes (Signed)
[name] - 69 y.o. female MRN [id number]  Date of birth: [birth year]  Office Visit Note: Visit Date: 08/17/2019 PCP: Nolene Ebbs, MD Referred by: Nolene Ebbs, MD  Subjective: Chief Complaint  Patient presents with  . Middle Back - Pain  . Lower Back - Pain   HPI:  Tiffany [name] is a 69 y.o. female who comes in today for planned repeat Left L5-S1 Lumbar epidural steroid injection with fluoroscopic guidance.  The patient has failed conservative care including home exercise, medications, time and activity modification.  This injection will be diagnostic and hopefully therapeutic.  Please see requesting physician notes for further details and justification. Patient received more than 50% pain relief from prior injection.   Referring: Dr. Neomia Dear  Historically she has had more right-sided complaints but she is having left radicular type low back pain.  She has history of lumbar fusion and MRI reviewed again and reviewed below.  This MRI was from 2019 did show facet arthropathy above the fusion but no central stenosis and below the fusion there is broad disc bulging with some lateral recess narrowing.  Epidural injection has been more beneficial to her than diagnostic facet blocks.  She could be a candidate for spinal cord stimulator trial although would be a difficult trial with her body habitus.  Her situation is complicated by history of obesity as well as orthopedic complaints.  Since have seen her last she has undergone total knee replacement by Dr. Alvan Dame.  She feels like she was doing pretty well after the last injection until she had the surgery and that just seems like it made her back flareup.  ROS Otherwise per HPI.  Assessment & Plan: Visit Diagnoses:  1. Lumbar radiculopathy   2. Post laminectomy syndrome     Plan: No additional findings.   Meds & Orders:  Meds ordered this encounter  Medications  . methylPREDNISolone acetate (DEPO-MEDROL) injection  80 mg    Orders Placed This Encounter  Procedures  . XR C-ARM NO REPORT  . Epidural Steroid injection    Follow-up: No follow-ups on file.   Procedures: No procedures performed  Lumbar Epidural Steroid Injection - Interlaminar Approach with Fluoroscopic Guidance  Patient: Tiffany [name]      Date of Birth: July 20, 1950 MRN: [id number] PCP: Nolene Ebbs, MD      Visit Date: 08/17/2019   Universal Protocol:     Consent Given By: the patient  Position: PRONE  Additional Comments: Vital signs were monitored before and after the procedure. Patient was prepped and draped in the usual sterile fashion. The correct patient, procedure, and site was verified.   Injection Procedure Details:  Procedure Site One Meds Administered:  Meds ordered this encounter  Medications  . methylPREDNISolone acetate (DEPO-MEDROL) injection 80 mg     Laterality: Left  Location/Site:  L5-S1  Needle size: 20 G, 4.5 in  Needle type: Tuohy  Needle Placement: Paramedian epidural  Findings:   -Comments: Excellent flow of contrast into the epidural space.  Procedure Details: Using a paramedian approach from the side mentioned above, the region overlying the inferior lamina was localized under fluoroscopic visualization and the soft tissues overlying this structure were infiltrated with 4 ml. of 1% Lidocaine without Epinephrine. The Tuohy needle was inserted into the epidural space using a paramedian approach.   The epidural space was localized using loss of resistance along with lateral and bi-planar fluoroscopic views.  After negative aspirate for air, blood, and CSF, a 2  ml. volume of Isovue-250 was injected into the epidural space and the flow of contrast was observed. Radiographs were obtained for documentation purposes.    The injectate was administered into the level noted above.   Additional Comments:  The patient tolerated the procedure well Dressing: 2 x 2 sterile gauze and  Band-Aid    Post-procedure details: Patient was observed during the procedure. Post-procedure instructions were reviewed.  Patient left the clinic in stable condition.     Clinical History: MRI LUMBAR SPINE WITHOUT CONTRAST  TECHNIQUE: Multiplanar, multisequence MR imaging of the lumbar spine was performed. No intravenous contrast was administered.  COMPARISON: 11/05/2012  FINDINGS: Segmentation: 5 lumbar type vertebral bodies assumed.  Alignment: No malalignment.  Vertebrae: No fracture or primary bone lesion.  Conus medullaris and cauda equina: Conus extends to the L1 level. Conus and cauda equina appear normal.  Paraspinal and other soft tissues: Simple appearing renal cysts.  Disc levels:  Degenerative disc disease at T9-10 and T10-11 with disc bulges but no apparent compressive stenosis. Those levels were not studied in detail.  T12-L1 and L1-2 are normal.  L2-3: Mild bulging of the disc. Facet degeneration with ligamentous hypertrophy and joint effusions. Mild narrowing of the canal but no compressive stenosis in this position. This appearance could worsen with standing or flexion, based on the morphology of the facet arthropathy. This has worsened since 2014.  L3-4: Previous PLIF is solid with wide patency of the canal and foramina.  L4-5: Previous left hemilaminectomy. Endplate osteophytes and mild bulging of the disc. No compressive stenosis.  L5-S1: Central disc protrusion contacts the thecal sac in the S1 root sleeves as they bud from the thecal sac, though definite neural compression is not demonstrated. Foramina appear sufficiently patent. Mild bilateral facet osteoarthritis. Similar appearance to the study of 2014.  IMPRESSION: Since 2014, there has been worsening of adjacent segment degenerative disease at L2-3. The disc bulges mildly. There is bilateral facet arthropathy with ligamentous hypertrophy and fluid-filled joints.  There is mild stenosis of the canal in this position, but this appearance would likely worsen with standing or flexion.  Good appearance at the previous discectomy and fusion level of L3-4.  Satisfactory appearance at the previous surgical level of L4-5 with previous left hemilaminectomy. Mild degenerative changes without compressive stenosis.  L5-S1 shows a chronic central disc protrusion and mild facet degeneration but no apparent compressive stenosis or change since,2014.   Electronically Signed By: Nelson Chimes M.D. On: 09/28/2017 07:00     Objective:  VS:  HT:    WT:   BMI:     BP:(!) 156/73  HR:60bpm  TEMP: ( )  RESP:  Physical Exam Constitutional:      General: She is not in acute distress.    Appearance: Normal appearance. She is obese. She is not ill-appearing.  HENT:     Head: Normocephalic and atraumatic.     Right Ear: External ear normal.     Left Ear: External ear normal.  Eyes:     Extraocular Movements: Extraocular movements intact.  Cardiovascular:     Rate and Rhythm: Normal rate.     Pulses: Normal pulses.  Musculoskeletal:     Right lower leg: No edema.     Left lower leg: No edema.     Comments: Patient has good distal strength with no pain over the greater trochanters.  No clonus or focal weakness.  Well-healed surgical scar across the anterior knee with no signs of redness or swelling etc.  Skin:    Findings: No erythema, lesion or rash.  Neurological:     General: No focal deficit present.     Mental Status: She is alert and oriented to person, place, and time.     Sensory: No sensory deficit.     Motor: No weakness or abnormal muscle tone.     Coordination: Coordination normal.  Psychiatric:        Mood and Affect: Mood normal.        Behavior: Behavior normal.      Imaging: No results found.

## 2019-09-01 ENCOUNTER — Ambulatory Visit (HOSPITAL_COMMUNITY)
Admission: RE | Admit: 2019-09-01 | Discharge: 2019-09-01 | Disposition: A | Discharge status: Home / Self Care | Payer: Medicare Other | Source: Ambulatory Visit | Attending: Internal Medicine | Admitting: Internal Medicine

## 2019-09-01 ENCOUNTER — Other Ambulatory Visit: Payer: Self-pay

## 2019-09-01 VITALS — BP 142/65 | HR 56 | Temp 97.6°F | Resp 18

## 2019-09-01 DIAGNOSIS — N183 Chronic kidney disease, stage 3 unspecified: Secondary | ICD-10-CM | POA: Insufficient documentation

## 2019-09-01 LAB — POCT HEMOGLOBIN-HEMACUE: Hemoglobin: 9.1 g/dL — ABNORMAL LOW (ref 12.0–15.0)

## 2019-09-01 MED ORDER — EPOETIN ALFA-EPBX 10000 UNIT/ML IJ SOLN
INTRAMUSCULAR | Status: AC
  Filled 2019-09-01: qty 2

## 2019-09-01 MED ORDER — SODIUM CHLORIDE 0.9 % IV SOLN
510.0000 mg | INTRAVENOUS | Status: DC
  Administered 2019-09-01: 510 mg via INTRAVENOUS
  Filled 2019-09-01: qty 17

## 2019-09-01 MED ORDER — EPOETIN ALFA-EPBX 10000 UNIT/ML IJ SOLN
20000.0000 [IU] | INTRAMUSCULAR | Status: DC
  Administered 2019-09-01: 20000 [IU] via SUBCUTANEOUS

## 2019-09-15 ENCOUNTER — Other Ambulatory Visit: Payer: Self-pay

## 2019-09-15 ENCOUNTER — Encounter (HOSPITAL_COMMUNITY)
Admission: RE | Admit: 2019-09-15 | Discharge: 2019-09-15 | Disposition: A | Discharge status: Home / Self Care | Payer: Medicare Other | Source: Ambulatory Visit | Attending: Internal Medicine | Admitting: Internal Medicine

## 2019-09-15 VITALS — BP 180/81 | HR 70 | Temp 97.4°F

## 2019-09-15 DIAGNOSIS — N183 Chronic kidney disease, stage 3 unspecified: Secondary | ICD-10-CM

## 2019-09-15 DIAGNOSIS — N184 Chronic kidney disease, stage 4 (severe): Secondary | ICD-10-CM | POA: Diagnosis not present

## 2019-09-15 DIAGNOSIS — D631 Anemia in chronic kidney disease: Secondary | ICD-10-CM | POA: Insufficient documentation

## 2019-09-15 LAB — IRON AND TIBC
Iron: 71 ug/dL (ref 28–170)
Saturation Ratios: 26 % (ref 10.4–31.8)
TIBC: 269 ug/dL (ref 250–450)
UIBC: 198 ug/dL

## 2019-09-15 LAB — POCT HEMOGLOBIN-HEMACUE: Hemoglobin: 9.5 g/dL — ABNORMAL LOW (ref 12.0–15.0)

## 2019-09-15 LAB — FERRITIN: Ferritin: 1056 ng/mL — ABNORMAL HIGH (ref 11–307)

## 2019-09-15 MED ORDER — EPOETIN ALFA-EPBX 3000 UNIT/ML IJ SOLN
INTRAMUSCULAR | Status: AC
  Administered 2019-09-15: 3000 [IU]
  Filled 2019-09-15: qty 1

## 2019-09-15 MED ORDER — EPOETIN ALFA-EPBX 2000 UNIT/ML IJ SOLN
INTRAMUSCULAR | Status: AC
  Administered 2019-09-15: 2000 [IU]
  Filled 2019-09-15: qty 1

## 2019-09-15 MED ORDER — EPOETIN ALFA-EPBX 10000 UNIT/ML IJ SOLN
INTRAMUSCULAR | Status: AC
  Administered 2019-09-15: 20000 [IU]
  Filled 2019-09-15: qty 2

## 2019-09-15 MED ORDER — EPOETIN ALFA-EPBX 40000 UNIT/ML IJ SOLN: 25000.0000 [IU] | INTRAMUSCULAR | Status: DC

## 2019-09-24 ENCOUNTER — Ambulatory Visit: Payer: Medicare Other | Admitting: Internal Medicine

## 2019-09-24 ENCOUNTER — Other Ambulatory Visit: Payer: Self-pay

## 2019-09-24 ENCOUNTER — Ambulatory Visit (INDEPENDENT_AMBULATORY_CARE_PROVIDER_SITE_OTHER): Payer: Medicare Other

## 2019-09-24 ENCOUNTER — Encounter: Payer: Self-pay | Admitting: Internal Medicine

## 2019-09-24 VITALS — BP 146/80 | HR 74 | Temp 97.4°F | Ht 69.5 in | Wt 254.8 lb

## 2019-09-24 DIAGNOSIS — R0600 Dyspnea, unspecified: Secondary | ICD-10-CM

## 2019-09-24 DIAGNOSIS — R0609 Other forms of dyspnea: Secondary | ICD-10-CM

## 2019-09-24 DIAGNOSIS — R0602 Shortness of breath: Secondary | ICD-10-CM | POA: Diagnosis not present

## 2019-09-24 DIAGNOSIS — G4733 Obstructive sleep apnea (adult) (pediatric): Secondary | ICD-10-CM | POA: Diagnosis not present

## 2019-09-24 NOTE — Patient Instructions (Signed)
Order- Please change DME from [facility] to [facility]- patient request. Replace old CPAP machine, auto 5-15, mask of choice, humidifier, supplies, a/irView/ card  Order- CXR-   Dx Dyspnea on exertion  Order- schedule PFT   Dx Dyspnea on exertion  Please call if we can help

## 2019-09-24 NOTE — Progress Notes (Signed)
Subjective:    Patient ID: Tiffany [name], female    DOB: [birth year], 69 y.o.   MRN: [id number]  HPI  female never smoker followed for allergic rhinitis, OSA, complicated by obesity, HTN, hypothyroid, DM   -----------------------------------------------------------------------------------   06/12/2017- 69 year old female never smoker followed for allergic rhinitis, OSA, complicated by obesity, HTN, hypothyroid, DM CPAP 10/[facility]>> today reduce to 9  ----OSA: DME [facility] Pt wears CPAP nightly and DL attached. Pressure works well and will need order for supplies.  Download 83% compliance AHI 0.5/hour.  She says sometimes CPAP burns her nose and she takes it off.  Sounds like humidifier problem.  Exline has not needed rescue inhaler in over a year with little wheeze or cough.  We discussed BP (160/80 on arrival) and she will review with her PCP.  09/24/19- 69 year old female never smoker followed for allergic rhinitis, OSA, complicated by obesity, HTN, hypothyroid, DM CPAP 9/ [facility] Download- none today Body weight today 254 lbs Had 2 Ph[employer] Covax -----osa, states she is using every night, denies problems,wants to change DME to [facility] kept her machine several weeks to service it, but didn't fix problem. Still doesn't work. Increased DOE in recent months with little cough or wheeze. 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  ROS-see HPI + = positive Constitutional:   No-   weight loss, night sweats, fevers, chills, fatigue, lassitude. HEENT:   No-  headaches, difficulty swallowing, tooth/dental problems, sore throat,       No-  sneezing, itching, ear ache, nasal congestion, post nasal drip,  CV:  No-   chest pain, orthopnea, PND, swelling in lower extremities, anasarca,  dizziness, palpitations Resp: + shortness of breath with exertion or at rest.              No-   productive cough,  No non-productive cough,  No- coughing up of blood.              No-   change in color of mucus.  No-  wheezing.   Skin: No-   rash or lesions. GI:  No-   heartburn, indigestion, abdominal pain, nausea, vomiting,  GU:  MS:  No-   joint pain or swelling.   Neuro-     nothing unusual Psych:  No- change in mood or affect. No depression or anxiety.  No memory loss.  OBJ- Physical Exam  General- Alert, Oriented, Affect-appropriate, Distress- none acute, +obese Skin- rash-none, lesions- none, excoriation- none Lymphadenopathy- none Head- atraumatic            Eyes- Gross vision intact, PERRLA, conjunctivae and secretions clear, +strabismus            Ears- Hearing, canals-normal            Nose- Clear, no-Septal dev, mucus, polyps, erosion, perforation             Throat- Mallampati III , mucosa clear , drainage- none, tonsils- atrophic Neck- flexible , trachea midline, no stridor , thyroid nl, carotid no bruit Chest - symmetrical excursion , unlabored           Heart/CV- RRR ,+ 4-9/7 systolic murmur RUSB , no gallop  , no rub, nl s1 s2                           - JVD- none , edema- none, stasis changes- none, varices- none           Lung- clear to  P&A, wheeze- none, cough- none , dullness-none, rub- none           Chest wall-  Abd-  Br/ Gen/ Rectal- Not done, not indicated Extrem- cyanosis- none, clubbing, none, atrophy- none, strength- nl Neuro- grossly intact to observation

## 2019-09-28 ENCOUNTER — Telehealth: Payer: Self-pay | Admitting: Internal Medicine

## 2019-09-28 NOTE — Telephone Encounter (Signed)
I do not see that she had any labs done here  Will try calling her to discuss tomorrow at the time frame she gave

## 2019-09-29 ENCOUNTER — Encounter (HOSPITAL_COMMUNITY)
Admission: RE | Admit: 2019-09-29 | Discharge: 2019-09-29 | Disposition: A | Discharge status: Home / Self Care | Payer: Medicare Other | Source: Ambulatory Visit | Attending: Internal Medicine | Admitting: Internal Medicine

## 2019-09-29 ENCOUNTER — Other Ambulatory Visit: Payer: Self-pay

## 2019-09-29 ENCOUNTER — Encounter (HOSPITAL_COMMUNITY): Payer: Medicare Other

## 2019-09-29 VITALS — BP 165/70 | HR 72 | Resp 18

## 2019-09-29 DIAGNOSIS — N183 Chronic kidney disease, stage 3 unspecified: Secondary | ICD-10-CM | POA: Diagnosis not present

## 2019-09-29 LAB — POCT HEMOGLOBIN-HEMACUE: Hemoglobin: 10.6 g/dL — ABNORMAL LOW (ref 12.0–15.0)

## 2019-09-29 MED ORDER — EPOETIN ALFA-EPBX 2000 UNIT/ML IJ SOLN
INTRAMUSCULAR | Status: AC
  Administered 2019-09-29: 2000 [IU] via SUBCUTANEOUS
  Filled 2019-09-29: qty 1

## 2019-09-29 MED ORDER — EPOETIN ALFA-EPBX 10000 UNIT/ML IJ SOLN: 25000.0000 [IU] | INTRAMUSCULAR | Status: DC

## 2019-09-29 MED ORDER — EPOETIN ALFA-EPBX 10000 UNIT/ML IJ SOLN
INTRAMUSCULAR | Status: AC
  Administered 2019-09-29: 20000 [IU] via SUBCUTANEOUS
  Filled 2019-09-29: qty 2

## 2019-09-29 MED ORDER — EPOETIN ALFA-EPBX 3000 UNIT/ML IJ SOLN
INTRAMUSCULAR | Status: AC
  Administered 2019-09-29: 3000 [IU] via SUBCUTANEOUS
  Filled 2019-09-29: qty 1

## 2019-09-29 NOTE — Telephone Encounter (Signed)
Pt had a cxr performed at last [facility]. I see where pt has been attempted to be contacted multiple times to go over results.  Deneise Lever, MD  09/24/2019 9:05 PM EDT     Cxr- heart is enlarged but stable. Lungs are clear   Attempted to call pt but unable to reach. Left message for her to return call in regards to the cxr results.

## 2019-10-04 ENCOUNTER — Ambulatory Visit: Payer: Medicare Other | Admitting: Internal Medicine

## 2019-10-05 NOTE — Telephone Encounter (Signed)
Pt already called for results on 8/26. Will sign off.

## 2019-10-11 NOTE — Assessment & Plan Note (Signed)
Mostly obesity and deconditioning, but murmur seems louder, so cardiac component may deserve update assessment. Plan- CXR, PFT

## 2019-10-11 NOTE — Assessment & Plan Note (Signed)
Wants to use her CPAP but says DME didn't fix it and not working. Probably old enough to replace. She also specifically asks to change to [place]. Plan- Change to [facility], replace old CPAP auto 5-15

## 2019-10-13 ENCOUNTER — Encounter (HOSPITAL_COMMUNITY)
Admission: RE | Admit: 2019-10-13 | Discharge: 2019-10-13 | Disposition: A | Discharge status: Home / Self Care | Payer: Medicare Other | Source: Ambulatory Visit | Attending: Internal Medicine | Admitting: Internal Medicine

## 2019-10-13 ENCOUNTER — Other Ambulatory Visit: Payer: Self-pay

## 2019-10-13 VITALS — BP 158/71 | HR 63 | Temp 97.4°F | Resp 18

## 2019-10-13 DIAGNOSIS — N184 Chronic kidney disease, stage 4 (severe): Secondary | ICD-10-CM | POA: Diagnosis present

## 2019-10-13 DIAGNOSIS — D631 Anemia in chronic kidney disease: Secondary | ICD-10-CM | POA: Diagnosis not present

## 2019-10-13 DIAGNOSIS — N183 Chronic kidney disease, stage 3 unspecified: Secondary | ICD-10-CM | POA: Diagnosis not present

## 2019-10-13 LAB — IRON AND TIBC
Iron: 93 ug/dL (ref 28–170)
Saturation Ratios: 35 % — ABNORMAL HIGH (ref 10.4–31.8)
TIBC: 265 ug/dL (ref 250–450)
UIBC: 172 ug/dL

## 2019-10-13 LAB — POCT HEMOGLOBIN-HEMACUE: Hemoglobin: 10.1 g/dL — ABNORMAL LOW (ref 12.0–15.0)

## 2019-10-13 LAB — FERRITIN: Ferritin: 909 ng/mL — ABNORMAL HIGH (ref 11–307)

## 2019-10-13 MED ORDER — EPOETIN ALFA-EPBX 3000 UNIT/ML IJ SOLN
INTRAMUSCULAR | Status: AC
  Filled 2019-10-13: qty 1

## 2019-10-13 MED ORDER — EPOETIN ALFA-EPBX 2000 UNIT/ML IJ SOLN
INTRAMUSCULAR | Status: AC
  Filled 2019-10-13: qty 1

## 2019-10-13 MED ORDER — EPOETIN ALFA-EPBX 10000 UNIT/ML IJ SOLN
25000.0000 [IU] | INTRAMUSCULAR | Status: DC
  Administered 2019-10-13: 25000 [IU] via SUBCUTANEOUS

## 2019-10-13 MED ORDER — EPOETIN ALFA-EPBX 10000 UNIT/ML IJ SOLN
INTRAMUSCULAR | Status: AC
  Filled 2019-10-13: qty 2

## 2019-10-18 ENCOUNTER — Ambulatory Visit: Payer: Medicare Other | Attending: Orthopedic Surgery

## 2019-10-18 ENCOUNTER — Other Ambulatory Visit: Payer: Self-pay

## 2019-10-18 DIAGNOSIS — R262 Difficulty in walking, not elsewhere classified: Secondary | ICD-10-CM | POA: Diagnosis present

## 2019-10-18 DIAGNOSIS — M6281 Muscle weakness (generalized): Secondary | ICD-10-CM | POA: Insufficient documentation

## 2019-10-18 DIAGNOSIS — Z9889 Other specified postprocedural states: Secondary | ICD-10-CM | POA: Diagnosis not present

## 2019-10-18 DIAGNOSIS — G8929 Other chronic pain: Secondary | ICD-10-CM | POA: Insufficient documentation

## 2019-10-18 DIAGNOSIS — M25562 Pain in left knee: Secondary | ICD-10-CM | POA: Insufficient documentation

## 2019-10-18 NOTE — Therapy (Signed)
[place], [place], [address] Phone: [phone]   Fax:  [phone]  Physical Therapy Evaluation  Patient Details  Name: Tiffany [name] MRN: [id number] Date of Birth: [birth year] Referring Provider (PT): Danae Orleans, PA-C   Encounter Date: 10/18/2019   PT End of Session - 10/18/19 1030    Visit Number 1    Number of Visits 16    Date for PT Re-Evaluation 12/13/19    Authorization Type [employer] Medicare    PT Start Time 1022    PT Stop Time 1100    PT Time Calculation (min) 38 min    Activity Tolerance Patient tolerated treatment well    Behavior During Therapy [facility] for tasks assessed/performed           Past Medical History:  Diagnosis Date  . ALLERGIC RHINITIS   . Arthritis   . Chronic diastolic heart failure ([place]) 05/05/2013   Echo 03/2018:  EF 14-48, +diastolic dysfunction, GLS -20.5%, normal RVSF, RVSP 42.5 (mild elevated), severe LAE, small pericardial eff, mild MAc, mod AV calc, mild PI  . CKD (chronic kidney disease)   . Diabetes mellitus   . Heart murmur    at birth  . Hyperlipidemia   . Hypertension   . Hypothyroid   . Iron deficiency anemia   . Obesity   . OSA on CPAP   . Previous back surgery    x 2  . S/P arthroscopy     Past Surgical History:  Procedure Laterality Date  . BREAST BIOPSY  20+ yrs ago   dont know which breast per pt; benign  . BREAST REDUCTION SURGERY  1982  . KNEE SURGERY     x 2  . LUMBAR DISC SURGERY     x 2  . REDUCTION MAMMAPLASTY Bilateral 1985  . TOE SURGERY    . TOTAL ABDOMINAL HYSTERECTOMY  1997   partial hysterectomy- still has ovaries  . TOTAL KNEE ARTHROPLASTY Left 07/01/2019   Procedure: TOTAL KNEE ARTHROPLASTY;  Surgeon: Paralee Cancel, MD;  Location: [facility];  Service: Orthopedics;  Laterality: Left;  70 min NO BLOOD PRODUCTS!!  . TREATMENT FISTULA ANAL      There were no vitals filed for this visit.    Subjective Assessment - 10/18/19  1037    Subjective Pt reports L knee began years ago. It's been ongoing with a left knee arthroscopy in the 90's. Pt's mobility declined and injections failed, so she had a L TKA on 07/01/19.    Pertinent History L knee arthroscopy 1990's    Limitations Standing;Walking;House hold activities    How long can you sit comfortably? as long as you can    How long can you stand comfortably? do what I gotta do and get off of it    How long can you walk comfortably? a few minutes    Patient Stated Goals To get where I can do things    Currently in Pain? Yes    Pain Score 8     Pain Location Knee    Pain Orientation Lateral;Lower    Pain Descriptors / Indicators Aching    Pain Type Surgical pain;Acute pain    Pain Onset More than a month ago    Pain Frequency Intermittent    Aggravating Factors  standing, walking              [facility] PT Assessment - 10/18/19 0001      Assessment   Medical  Diagnosis L TKA    Referring Provider (PT) Danae Orleans, PA-C    Onset Date/Surgical Date 07/01/19    Hand Dominance Right    Next MD Visit PRN    Prior Therapy HHPT after surgery      Balance Screen   Has the patient fallen in the past 6 months No    Has the patient had a decrease in activity level because of a fear of falling?  Yes    Is the patient reluctant to leave their home because of a fear of falling?  Yes      Prior Function   Level of Independence Independent    Vocation Full time employment    Architect    Leisure Read, puzzles, visit family      Observation/Other Assessments   Focus on Therapeutic Outcomes (FOTO)  34% limited      ROM / Strength   AROM / PROM / Strength AROM;Strength      AROM   AROM Assessment Site Knee    Right/Left Knee Right;Left    Right Knee Extension 4    Right Knee Flexion 112    Left Knee Extension 5    Left Knee Flexion 105      Strength   Strength Assessment Site Knee    Right/Left Knee Right;Left    Right Knee Flexion  4+/5    Right Knee Extension 4+/5    Left Knee Flexion 4/5    Left Knee Extension 4/5      Palpation   Patella mobility hypomobile multidirectional    Palpation comment TTP to lateral L knee around fibular head                      Objective measurements completed on examination: See above findings.               PT Education - 10/18/19 1250    Education Details Diagnosis/surgery, prognosis, POC, HEP, FOTO    Person(s) Educated Patient    Methods Explanation;Demonstration;Tactile cues;Verbal cues;Handout    Comprehension Returned demonstration;Verbalized understanding;Verbal cues required;Tactile cues required            PT Short Term Goals - 10/18/19 1253      PT SHORT TERM GOAL #1   Title Pt will be I and compliant with initial HEP.    Time 2    Period Weeks    Status New    Target Date 11/01/19      PT SHORT TERM GOAL #2   Title Pt will report ability to stand for at least 10 minutes with <5/10 pain.    Time 4    Period Weeks    Status New    Target Date 11/15/19             PT Long Term Goals - 10/18/19 1254      PT LONG TERM GOAL #1   Title Pt will increase knee MMT to 5/5.    Time 8    Period Weeks    Status New    Target Date 12/13/19      PT LONG TERM GOAL #2   Title Pt will increase L knee AROM from 0-120 without pain.    Time 8    Period Weeks    Status New    Target Date 12/13/19      PT LONG TERM GOAL #3   Title Pt will decrease FOTO limitation to <25% disability  to show a significant functional change.    Baseline 34% disability    Time 8    Period Weeks    Status New    Target Date 12/13/19      PT LONG TERM GOAL #4   Title Pt will be able to stand for 20 minutes or greater to perform ADLs and IADLs.    Time 8    Period Weeks    Status New    Target Date 12/13/19      PT LONG TERM GOAL #5   Title Pt will ambulate for 20 minutes or greater, in order to run errands and participate in leisure activity.     Time 8    Period Weeks    Status New    Target Date 12/13/19                  Plan - 10/18/19 1037    Clinical Impression Statement Pt is a 69 yo female who underwent L TKA on 07/01/19. Pt received HHPT following, but has had ~1 month lapse in PT. She ambulates with decrease L ankle DF likely secondary to lack of terminal L TKE, as pt demonstated improvement following HEP instruction. Pt demonstrates impairments in gait, knee ROM (5-105), strength, and pain. She will benefit from physical therapy to address these impairments 1-2x/week for up to 8 weeks. Pt was edu on POC, HEP, FOTO, dx, px and verbalized understanding, as well as consent to tx.    Personal Factors and Comorbidities Comorbidity 1;Comorbidity 2;Comorbidity 3+    Comorbidities Hypothyroidism, heart murmur, HLD, HTN, CKD, DM    Examination-Activity Limitations Stand    Examination-Participation Restrictions Community Activity;Cleaning;Laundry;Interpersonal Relationship;Shop;Meal Prep    Stability/Clinical Decision Making Stable/Uncomplicated    Clinical Decision Making Low    Rehab Potential Good    PT Frequency 1x / week   up to 2x   PT Duration 8 weeks    PT Treatment/Interventions ADLs/Self Care Home Management;Cryotherapy;Iontophoresis 84m/ml Dexamethasone;Moist Heat;Electrical Stimulation;Therapeutic activities;Functional mobility training;Stair training;Gait training;Therapeutic exercise;Balance training;Neuromuscular re-education;Taping;Dry needling;Passive range of motion;Vasopneumatic Device;Manual techniques;Scar mobilization;Patient/family education    PT Next Visit Plan Review HEP, progress left knee ROM, mobility and strength; work on gait; manual therapy to address soft tissue and joint restrictions, try fibular head mobs    PT Home Exercise Plan see pt instructions    Consulted and Agree with Plan of Care Patient           Patient will benefit from skilled therapeutic intervention in order to improve the  following deficits and impairments:  Difficulty walking, Pain, Postural dysfunction, Increased fascial restricitons, Decreased strength, Decreased activity tolerance, Impaired perceived functional ability, Decreased range of motion, Decreased scar mobility, Hypomobility  Visit Diagnosis: S/P left knee arthroscopy  Chronic pain of left knee  Difficulty in walking, not elsewhere classified  Muscle weakness (generalized)     Problem List Patient Active Problem List   Diagnosis Date Noted  . S/P left TKA 07/01/2019  . Status post total left knee replacement 07/01/2019  . Hyperlipidemia 09/12/2016  . Type 2 diabetes mellitus without complications ([place] 081/44/8185 . Tachycardia 08/26/2016  . Palpitation 08/26/2016  . SOB (shortness of breath) 08/26/2016  . CKD (chronic kidney disease), stage III 04/28/2014  . Chronic diastolic heart failure ([facility] 05/05/2013  . Essential hypertension 05/05/2013  . HEART MURMUR, SYSTOLIC 063/14/9702 . UNSPECIFIED HYPOTHYROIDISM 02/12/2007  . OBESITY 01/08/2007  . Obstructive sleep apnea 01/08/2007  . Seasonal and perennial allergic rhinitis 01/08/2007  Tiffany [name], PT, DPT 10/18/2019, 1:04 PM  [facility] [address] [place], [place], [address] Phone: [phone]   Fax:  [phone]  Name: Tiffany [name] MRN: [id number] Date of Birth: 12/09/1950

## 2019-10-19 ENCOUNTER — Ambulatory Visit (INDEPENDENT_AMBULATORY_CARE_PROVIDER_SITE_OTHER): Payer: Medicare Other | Admitting: Internal Medicine

## 2019-10-19 DIAGNOSIS — R0600 Dyspnea, unspecified: Secondary | ICD-10-CM | POA: Diagnosis not present

## 2019-10-19 DIAGNOSIS — R0609 Other forms of dyspnea: Secondary | ICD-10-CM

## 2019-10-19 LAB — PULMONARY FUNCTION TEST
DL/VA % pred: 137 %
DL/VA: 5.49 ml/min/mmHg/L
DLCO cor % pred: 84 %
DLCO cor: 19.39 ml/min/mmHg
DLCO unc % pred: 84 %
DLCO unc: 19.39 ml/min/mmHg
FEF 25-75 Post: 3.09 L/sec
FEF 25-75 Pre: 1.83 L/sec
FEF2575-%Change-Post: 68 %
FEF2575-%Pred-Post: 144 %
FEF2575-%Pred-Pre: 85 %
FEV1-%Change-Post: 11 %
FEV1-%Pred-Post: 70 %
FEV1-%Pred-Pre: 63 %
FEV1-Post: 1.66 L
FEV1-Pre: 1.48 L
FEV1FVC-%Change-Post: 4 %
FEV1FVC-%Pred-Pre: 106 %
FEV6-%Change-Post: 7 %
FEV6-%Pred-Post: 65 %
FEV6-%Pred-Pre: 61 %
FEV6-Post: 1.91 L
FEV6-Pre: 1.79 L
FEV6FVC-%Pred-Post: 103 %
FEV6FVC-%Pred-Pre: 103 %
FVC-%Change-Post: 7 %
FVC-%Pred-Post: 63 %
FVC-%Pred-Pre: 59 %
FVC-Post: 1.91 L
FVC-Pre: 1.79 L
Post FEV1/FVC ratio: 87 %
Post FEV6/FVC ratio: 100 %
Pre FEV1/FVC ratio: 83 %
Pre FEV6/FVC Ratio: 100 %
RV % pred: 67 %
RV: 1.62 L
TLC % pred: 64 %
TLC: 3.74 L

## 2019-10-19 NOTE — Progress Notes (Signed)
Full PFT completed today ? ?

## 2019-10-24 ENCOUNTER — Other Ambulatory Visit: Payer: Self-pay | Admitting: Internal Medicine

## 2019-10-25 ENCOUNTER — Telehealth: Payer: Self-pay | Admitting: *Deleted

## 2019-10-25 NOTE — Telephone Encounter (Signed)
Received a refill request from patient's pharmacy.  Dr. Annamaria Boots, please advise.  Thank you.

## 2019-10-26 ENCOUNTER — Other Ambulatory Visit: Payer: Self-pay

## 2019-10-26 ENCOUNTER — Ambulatory Visit: Payer: Medicare Other

## 2019-10-26 DIAGNOSIS — Z9889 Other specified postprocedural states: Secondary | ICD-10-CM

## 2019-10-26 DIAGNOSIS — G8929 Other chronic pain: Secondary | ICD-10-CM

## 2019-10-26 DIAGNOSIS — M6281 Muscle weakness (generalized): Secondary | ICD-10-CM

## 2019-10-26 DIAGNOSIS — M25562 Pain in left knee: Secondary | ICD-10-CM

## 2019-10-26 DIAGNOSIS — R262 Difficulty in walking, not elsewhere classified: Secondary | ICD-10-CM

## 2019-10-26 NOTE — Telephone Encounter (Signed)
Zolpidem refilled.

## 2019-10-26 NOTE — Therapy (Signed)
[place], [place], [address] Phone: [phone]   Fax:  [phone]  Physical Therapy Treatment  Patient Details  Name: Tiffany [name] MRN: [id number] Date of Birth: 10-24-1950 Referring Provider (PT): Danae Orleans, PA-C   Encounter Date: 10/26/2019   PT End of Session - 10/26/19 1109    Visit Number 2    Number of Visits 16    Date for PT Re-Evaluation 12/13/19    Authorization Type UHC Medicare    PT Start Time 1104    PT Stop Time 1145    PT Time Calculation (min) 41 min    Activity Tolerance Patient tolerated treatment well    Behavior During Therapy [facility] for tasks assessed/performed           Past Medical History:  Diagnosis Date  . ALLERGIC RHINITIS   . Arthritis   . Chronic diastolic heart failure ([place]) 05/05/2013   Echo 03/2018:  EF 11-94, +diastolic dysfunction, GLS -20.5%, normal RVSF, RVSP 42.5 (mild elevated), severe LAE, small pericardial eff, mild MAc, mod AV calc, mild PI  . CKD (chronic kidney disease)   . Diabetes mellitus   . Heart murmur    at birth  . Hyperlipidemia   . Hypertension   . Hypothyroid   . Iron deficiency anemia   . Obesity   . OSA on CPAP   . Previous back surgery    x 2  . S/P arthroscopy     Past Surgical History:  Procedure Laterality Date  . BREAST BIOPSY  20+ yrs ago   dont know which breast per pt; benign  . BREAST REDUCTION SURGERY  1982  . KNEE SURGERY     x 2  . LUMBAR DISC SURGERY     x 2  . REDUCTION MAMMAPLASTY Bilateral 1985  . TOE SURGERY    . TOTAL ABDOMINAL HYSTERECTOMY  1997   partial hysterectomy- still has ovaries  . TOTAL KNEE ARTHROPLASTY Left 07/01/2019   Procedure: TOTAL KNEE ARTHROPLASTY;  Surgeon: Paralee Cancel, MD;  Location: [facility];  Service: Orthopedics;  Laterality: Left;  70 min NO BLOOD PRODUCTS!!  . TREATMENT FISTULA ANAL      There were no vitals filed for this visit.   Subjective Assessment - 10/26/19 1108     Subjective Pt reports the lateral knee pain is going down.    Pertinent History L knee arthroscopy 1990's    Limitations Standing;Walking;House hold activities    How long can you sit comfortably? as long as you can    How long can you stand comfortably? do what I gotta do and get off of it    How long can you walk comfortably? a few minutes    Patient Stated Goals To get where I can do things    Currently in Pain? Yes    Pain Score 2     Pain Location Knee    Pain Orientation Lower;Lateral    Pain Descriptors / Indicators Sore;Discomfort    Pain Type Acute pain;Surgical pain    Pain Onset More than a month ago    Pain Frequency Intermittent                             [facility] Adult PT Treatment/Exercise - 10/26/19 0001      Exercises   Exercises Knee/Hip      Knee/Hip Exercises: Stretches   Passive Hamstring Stretch Both;2 reps;3  reps   2 RLE, 3 LLE   Passive Hamstring Stretch Limitations c strap    ITB Stretch Both;2 reps;20 seconds    ITB Stretch Limitations c strap    Other Knee/Hip Stretches Seated Knee Ext Mobs c osc 3x30"      Knee/Hip Exercises: Aerobic   Nustep L6 6' UE/LE      Knee/Hip Exercises: Seated   Other Seated Knee/Hip Exercises QS 10x5"   after knee ext mobs      Knee/Hip Exercises: Supine   Quad Sets Strengthening;Both;1 set;10 reps    Quad Sets Limitations 5" hold + inner thigh squeeze to maintain neutral HKA alignment    Straight Leg Raises Both;1 set;10 reps                    PT Short Term Goals - 10/18/19 1253      PT SHORT TERM GOAL #1   Title Pt will be I and compliant with initial HEP.    Time 2    Period Weeks    Status New    Target Date 11/01/19      PT SHORT TERM GOAL #2   Title Pt will report ability to stand for at least 10 minutes with <5/10 pain.    Time 4    Period Weeks    Status New    Target Date 11/15/19             PT Long Term Goals - 10/18/19 1254      PT LONG TERM GOAL #1    Title Pt will increase knee MMT to 5/5.    Time 8    Period Weeks    Status New    Target Date 12/13/19      PT LONG TERM GOAL #2   Title Pt will increase L knee AROM from 0-120 without pain.    Time 8    Period Weeks    Status New    Target Date 12/13/19      PT LONG TERM GOAL #3   Title Pt will decrease FOTO limitation to <25% disability to show a significant functional change.    Baseline 34% disability    Time 8    Period Weeks    Status New    Target Date 12/13/19      PT LONG TERM GOAL #4   Title Pt will be able to stand for 20 minutes or greater to perform ADLs and IADLs.    Time 8    Period Weeks    Status New    Target Date 12/13/19      PT LONG TERM GOAL #5   Title Pt will ambulate for 20 minutes or greater, in order to run errands and participate in leisure activity.    Time 8    Period Weeks    Status New    Target Date 12/13/19                 Plan - 10/26/19 1110    Clinical Impression Statement Pt presents with good improvement in L lateral knee pain down from 8 to 2/10 today. She tolerated tx well with addition of stretching HS/lateral hip and Nustep today. She was provided with pictures of stretches for home program.    Personal Factors and Comorbidities Comorbidity 1;Comorbidity 2;Comorbidity 3+    Comorbidities Hypothyroidism, heart murmur, HLD, HTN, CKD, DM    Examination-Activity Limitations Stand    Examination-Participation Restrictions Community Activity;Cleaning;Laundry;Interpersonal Relationship;Shop;Meal  Prep    Stability/Clinical Decision Making Stable/Uncomplicated    Rehab Potential Good    PT Frequency 1x / week   up to 2x   PT Duration 8 weeks    PT Treatment/Interventions ADLs/Self Care Home Management;Cryotherapy;Iontophoresis 46m/ml Dexamethasone;Moist Heat;Electrical Stimulation;Therapeutic activities;Functional mobility training;Stair training;Gait training;Therapeutic exercise;Balance training;Neuromuscular  re-education;Taping;Dry needling;Passive range of motion;Vasopneumatic Device;Manual techniques;Scar mobilization;Patient/family education    PT Next Visit Plan Left knee ROM, mobility and strength; work on gait; manual therapy to address soft tissue and joint restrictions, try fibular head mobs, Nustep    PT Home Exercise Plan see pt instructions    Consulted and Agree with Plan of Care Patient           Patient will benefit from skilled therapeutic intervention in order to improve the following deficits and impairments:  Difficulty walking, Pain, Postural dysfunction, Increased fascial restricitons, Decreased strength, Decreased activity tolerance, Impaired perceived functional ability, Decreased range of motion, Decreased scar mobility, Hypomobility  Visit Diagnosis: S/P left knee arthroscopy  Chronic pain of left knee  Difficulty in walking, not elsewhere classified  Muscle weakness (generalized)     Problem List Patient Active Problem List   Diagnosis Date Noted  . S/P left TKA 07/01/2019  . Status post total left knee replacement 07/01/2019  . Hyperlipidemia 09/12/2016  . Type 2 diabetes mellitus without complications ([place] 029/24/4628 . Tachycardia 08/26/2016  . Palpitation 08/26/2016  . SOB (shortness of breath) 08/26/2016  . CKD (chronic kidney disease), stage III 04/28/2014  . [place] diastolic heart [place] ([place] 05/05/2013  . Essential hypertension 05/05/2013  . HEART MURMUR, SYSTOLIC 063/81/7711 . UNSPECIFIED HYPOTHYROIDISM 02/12/2007  . OBESITY 01/08/2007  . Obstructive sleep apnea 01/08/2007  . Seasonal and perennial allergic rhinitis 01/08/2007    KIzell  PT, DPT 10/26/2019, 11:46 AM  [facility] [address]Phone: [phone]  Fax:  [phone] Name: CRozella ServelloMRN: [id number] of Birth: 201-Jun-1952

## 2019-10-26 NOTE — Telephone Encounter (Signed)
Dr. Annamaria Boots, please advise if you are okay refilling med.  No Known Allergies   Current Outpatient Medications:  .  acetaminophen (TYLENOL) 650 MG CR tablet, Take 650 mg by mouth every 8 (eight) hours as needed for pain., Disp: , Rfl:  .  albuterol (VENTOLIN HFA) 108 (90 Base) MCG/ACT inhaler, Inhale 1-2 puffs into the lungs every 6 (six) hours as needed for wheezing or shortness of breath. (Patient not taking: Reported on 10/18/2019), Disp: , Rfl:  .  allopurinol (ZYLOPRIM) 100 MG tablet, Take 100 mg by mouth 2 (two) times daily., Disp: , Rfl: 1 .  amLODipine (NORVASC) 10 MG tablet, Take 10 mg by mouth 2 (two) times daily. , Disp: , Rfl:  .  B Complex Vitamins (B COMPLEX PO), Take 1 tablet by mouth daily., Disp: , Rfl:  .  B-D INS SYRINGE 0.5CC/30GX1/2" 30G X 1/2" 0.5 ML MISC, TAKE AS DIRECTED TWICE A DAY, Disp: , Rfl: 3 .  BIOTIN 5000 PO, Take 5,000 mcg by mouth daily. , Disp: , Rfl:  .  calcitRIOL (ROCALTROL) 0.25 MCG capsule, Take 0.25 mcg by mouth daily at 6 PM. (1700), Disp: , Rfl:  .  carvedilol (COREG) 6.25 MG tablet, Take 6.25 mg by mouth in the morning and at bedtime., Disp: , Rfl:  .  cetirizine (ZYRTEC) 10 MG tablet, Take 10 mg by mouth daily as needed for allergies., Disp: , Rfl:  .  cloNIDine (CATAPRES - DOSED IN MG/24 HR) 0.3 mg/24hr, Place 0.3 mg onto the skin every Friday. , Disp: , Rfl:  .  docusate sodium (COLACE) 100 MG capsule, Take 1 capsule (100 mg total) by mouth 2 (two) times daily. (Patient not taking: Reported on 10/18/2019), Disp: 28 capsule, Rfl: 0 .  HYDROcodone-acetaminophen (NORCO) 7.5-325 MG tablet, Take 1-2 tablets by mouth every 4 (four) hours as needed for moderate pain. (Patient not taking: Reported on 10/18/2019), Disp: 60 tablet, Rfl: 0 .  insulin glargine (LANTUS) 100 UNIT/ML injection, Inject 18 Units into the skin at bedtime. , Disp: , Rfl:  .  levothyroxine (LEVO-T) 112 MCG tablet, Take 224-336 mcg by mouth See admin instructions. Take 3 tablets (336 mcg) by  mouth on Sundays at 2200 & take 2 tablets (224 mcg) by mouth on Mondays, Tuesdays, Wednesdays, Thursdays, Fridays, & Saturdays at 2200., Disp: , Rfl:  .  methocarbamol (ROBAXIN) 500 MG tablet, Take 1 tablet (500 mg total) by mouth every 6 (six) hours as needed for muscle spasms., Disp: 40 tablet, Rfl: 0 .  Multiple Vitamins-Minerals (PRESERVISION AREDS PO), Take 1 tablet by mouth in the morning and at bedtime., Disp: , Rfl:  .  Omega-3 Fatty Acids (FISH OIL) 500 MG CAPS, Take 500 mg by mouth daily in the afternoon., Disp: , Rfl:  .  polyethylene glycol (MIRALAX / GLYCOLAX) 17 g packet, Take 17 g by mouth 2 (two) times daily., Disp: 28 packet, Rfl: 0 .  rosuvastatin (CRESTOR) 10 MG tablet, Take 10 mg by mouth every evening. , Disp: , Rfl:  .  SYSTANE COMPLETE 0.6 % SOLN, Place 1 drop into both eyes in the morning, at noon, in the evening, and at bedtime., Disp: , Rfl:  .  torsemide (DEMADEX) 100 MG tablet, Take 100-150 mg by mouth See admin instructions. Alternating between 1 tablet (100 mg) & 1.5 tablets (150 mg) every other day, Disp: , Rfl:  .  zolpidem (AMBIEN) 10 MG tablet, TAKE 1 TABLET BY MOUTH EVERY DAY AT BEDTIME AS NEEDED FOR SLEEP (Patient  taking differently: Take 10 mg by mouth at bedtime as needed for sleep. ), Disp: 30 tablet, Rfl: 5

## 2019-10-27 ENCOUNTER — Encounter (HOSPITAL_COMMUNITY): Payer: Medicare Other

## 2019-10-27 ENCOUNTER — Ambulatory Visit (HOSPITAL_COMMUNITY)
Admission: RE | Admit: 2019-10-27 | Discharge: 2019-10-27 | Disposition: A | Discharge status: Home / Self Care | Payer: Medicare Other | Source: Ambulatory Visit | Attending: Internal Medicine | Admitting: Internal Medicine

## 2019-10-27 VITALS — BP 159/65 | HR 58 | Temp 97.5°F | Resp 18

## 2019-10-27 DIAGNOSIS — N183 Chronic kidney disease, stage 3 unspecified: Secondary | ICD-10-CM | POA: Insufficient documentation

## 2019-10-27 MED ORDER — EPOETIN ALFA-EPBX 3000 UNIT/ML IJ SOLN
INTRAMUSCULAR | Status: AC
  Filled 2019-10-27: qty 1

## 2019-10-27 MED ORDER — EPOETIN ALFA-EPBX 2000 UNIT/ML IJ SOLN
INTRAMUSCULAR | Status: AC
  Filled 2019-10-27: qty 1

## 2019-10-27 MED ORDER — EPOETIN ALFA-EPBX 10000 UNIT/ML IJ SOLN
INTRAMUSCULAR | Status: AC
  Administered 2019-10-27: 25000 [IU]
  Filled 2019-10-27: qty 2

## 2019-10-29 LAB — POCT HEMOGLOBIN-HEMACUE: Hemoglobin: 10 g/dL — ABNORMAL LOW (ref 12.0–15.0)

## 2019-11-04 ENCOUNTER — Ambulatory Visit: Payer: Medicare Other

## 2019-11-04 ENCOUNTER — Other Ambulatory Visit: Payer: Self-pay

## 2019-11-04 DIAGNOSIS — R262 Difficulty in walking, not elsewhere classified: Secondary | ICD-10-CM

## 2019-11-04 DIAGNOSIS — M6281 Muscle weakness (generalized): Secondary | ICD-10-CM

## 2019-11-04 DIAGNOSIS — Z9889 Other specified postprocedural states: Secondary | ICD-10-CM | POA: Diagnosis not present

## 2019-11-04 DIAGNOSIS — M25562 Pain in left knee: Secondary | ICD-10-CM

## 2019-11-04 DIAGNOSIS — G8929 Other chronic pain: Secondary | ICD-10-CM

## 2019-11-04 NOTE — Therapy (Signed)
[place], [place], [address] Phone: [phone]   Fax:  [phone]  Physical Therapy Treatment  Patient Details  Name: Tiffany [name] MRN: [id number] Date of Birth: Oct 20, 1950 Referring Provider (PT): Danae Orleans, PA-C   Encounter Date: 11/04/2019   PT End of Session - 11/04/19 1021    Visit Number 3    Number of Visits 16    Date for PT Re-Evaluation 12/13/19    Authorization Type UHC Medicare    PT Start Time 1016    PT Stop Time 1101    PT Time Calculation (min) 45 min    Activity Tolerance Patient tolerated treatment well    Behavior During Therapy [facility] for tasks assessed/performed           Past Medical History:  Diagnosis Date  . ALLERGIC RHINITIS   . Arthritis   . Chronic diastolic heart failure ([place]) 05/05/2013   Echo 03/2018:  EF 16-60, +diastolic dysfunction, GLS -20.5%, normal RVSF, RVSP 42.5 (mild elevated), severe LAE, small pericardial eff, mild MAc, mod AV calc, mild PI  . CKD (chronic kidney disease)   . Diabetes mellitus   . Heart murmur    at birth  . Hyperlipidemia   . Hypertension   . Hypothyroid   . Iron deficiency anemia   . Obesity   . OSA on CPAP   . Previous back surgery    x 2  . S/P arthroscopy     Past Surgical History:  Procedure Laterality Date  . BREAST BIOPSY  20+ yrs ago   dont know which breast per pt; benign  . BREAST REDUCTION SURGERY  1982  . KNEE SURGERY     x 2  . LUMBAR DISC SURGERY     x 2  . REDUCTION MAMMAPLASTY Bilateral 1985  . TOE SURGERY    . TOTAL ABDOMINAL HYSTERECTOMY  1997   partial hysterectomy- still has ovaries  . TOTAL KNEE ARTHROPLASTY Left 07/01/2019   Procedure: TOTAL KNEE ARTHROPLASTY;  Surgeon: Paralee Cancel, MD;  Location: [facility];  Service: Orthopedics;  Laterality: Left;  70 min NO BLOOD PRODUCTS!!  . TREATMENT FISTULA ANAL      There were no vitals filed for this visit.   Subjective Assessment - 11/04/19 1020     Subjective Pt reports she feels lateral knee pain again like a catch that makes her knee feel like it's going to give out.    Pertinent History L knee arthroscopy 1990's    Limitations Standing;Walking;House hold activities    How long can you sit comfortably? as long as you can    How long can you stand comfortably? do what I gotta do and get off of it    How long can you walk comfortably? a few minutes    Patient Stated Goals To get where I can do things    Currently in Pain? Yes    Pain Score 7    when walking, 0/10 sitging   Pain Location Knee    Pain Orientation Left;Lateral    Pain Onset More than a month ago                             [facility] Adult PT Treatment/Exercise - 11/04/19 0001      Knee/Hip Exercises: Stretches   Passive Hamstring Stretch Both;2 reps    ITB Stretch Both;2 reps;20 seconds    ITB Stretch Limitations  c strap, + ADD    Other Knee/Hip Stretches Seated Knee Ext Mobs c osc 3x30" + ER   pain c IR, none c ER     Knee/Hip Exercises: Aerobic   Nustep L5 6' UE/LE      Knee/Hip Exercises: Standing   Terminal Knee Extension Strengthening;Left;1 set;15 reps    Terminal Knee Extension Limitations ball @ wall, 5"      Knee/Hip Exercises: Supine   Quad Sets Strengthening;Both;1 set;10 reps    Quad Sets Limitations 5" + towel ADD for neutral HKA alignment    Straight Leg Raises Both;1 set;10 reps      Knee/Hip Exercises: Sidelying   Clams L clams x 15                    PT Short Term Goals - 10/18/19 1253      PT SHORT TERM GOAL #1   Title Pt will be I and compliant with initial HEP.    Time 2    Period Weeks    Status New    Target Date 11/01/19      PT SHORT TERM GOAL #2   Title Pt will report ability to stand for at least 10 minutes with <5/10 pain.    Time 4    Period Weeks    Status New    Target Date 11/15/19             PT Long Term Goals - 10/18/19 1254      PT LONG TERM GOAL #1   Title Pt will  increase knee MMT to 5/5.    Time 8    Period Weeks    Status New    Target Date 12/13/19      PT LONG TERM GOAL #2   Title Pt will increase L knee AROM from 0-120 without pain.    Time 8    Period Weeks    Status New    Target Date 12/13/19      PT LONG TERM GOAL #3   Title Pt will decrease FOTO limitation to <25% disability to show a significant functional change.    Baseline 34% disability    Time 8    Period Weeks    Status New    Target Date 12/13/19      PT LONG TERM GOAL #4   Title Pt will be able to stand for 20 minutes or greater to perform ADLs and IADLs.    Time 8    Period Weeks    Status New    Target Date 12/13/19      PT LONG TERM GOAL #5   Title Pt will ambulate for 20 minutes or greater, in order to run errands and participate in leisure activity.    Time 8    Period Weeks    Status New    Target Date 12/13/19                 Plan - 11/04/19 1021    Clinical Impression Statement Pt tolerated seated knee ext mobs c ER s lateral knee pain. Incr difficulty with SLR today, cueing for TKE and neutral HKA. Added ADD S c strap, as well as L clams to initiate gluteal strengthening for incr knee support, providing pictures to pt.    Personal Factors and Comorbidities Comorbidity 1;Comorbidity 2;Comorbidity 3+    Comorbidities Hypothyroidism, heart murmur, HLD, HTN, CKD, DM    Examination-Activity Limitations Stand  Examination-Participation Restrictions Estate agent;Interpersonal Relationship;Shop;Meal Prep    Stability/Clinical Decision Making Stable/Uncomplicated    Rehab Potential Good    PT Frequency 1x / week   up to 2x   PT Duration 8 weeks    PT Treatment/Interventions ADLs/Self Care Home Management;Cryotherapy;Iontophoresis 53m/ml Dexamethasone;Moist Heat;Electrical Stimulation;Therapeutic activities;Functional mobility training;Stair training;Gait training;Therapeutic exercise;Balance training;Neuromuscular  re-education;Taping;Dry needling;Passive range of motion;Vasopneumatic Device;Manual techniques;Scar mobilization;Patient/family education    PT Next Visit Plan Left knee ROM, mobility and strength; work on gait; manual therapy to address soft tissue and joint restrictions, try fibular head mobs, Nustep, hip strengthening    PT Home Exercise Plan QS, knee ext mobs, SLR, HS/TFL/ADD S c strap, clams    Consulted and Agree with Plan of Care Patient           Patient will benefit from skilled therapeutic intervention in order to improve the following deficits and impairments:  Difficulty walking, Pain, Postural dysfunction, Increased fascial restricitons, Decreased strength, Decreased activity tolerance, Impaired perceived functional ability, Decreased range of motion, Decreased scar mobility, Hypomobility  Visit Diagnosis: S/P left knee arthroscopy  Chronic pain of left knee  Difficulty in walking, not elsewhere classified  Muscle weakness (generalized)     Problem List Patient Active Problem List   Diagnosis Date Noted  . S/P left TKA 07/01/2019  . Status post total left knee replacement 07/01/2019  . Hyperlipidemia 09/12/2016  . Type 2 diabetes mellitus without complications ([place] 050/10/3816 . Tachycardia 08/26/2016  . Palpitation 08/26/2016  . SOB (shortness of breath) 08/26/2016  . CKD (chronic kidney disease), stage III 04/28/2014  . Chronic diastolic heart failure ([place] 05/05/2013  . Essential hypertension 05/05/2013  . HEART MURMUR, SYSTOLIC 029/93/7169 . UNSPECIFIED HYPOTHYROIDISM 02/12/2007  . OBESITY 01/08/2007  . Obstructive sleep apnea 01/08/2007  . Seasonal and perennial allergic rhinitis 01/08/2007    KIzell [name] PT, DPT 11/04/2019, 11:09 AM  [facility] [address]Phone: [phone]  Fax:  [phone] Name: CTierra ThomaMRN: [id number] of Birth:  2Oct 12, 1952

## 2019-11-09 ENCOUNTER — Ambulatory Visit: Payer: Medicare Other

## 2019-11-10 ENCOUNTER — Encounter (HOSPITAL_COMMUNITY)
Admission: RE | Admit: 2019-11-10 | Discharge: 2019-11-10 | Disposition: A | Discharge status: Home / Self Care | Payer: Medicare Other | Source: Ambulatory Visit | Attending: Internal Medicine | Admitting: Internal Medicine

## 2019-11-10 ENCOUNTER — Other Ambulatory Visit: Payer: Self-pay

## 2019-11-10 VITALS — BP 161/74 | HR 65 | Temp 97.5°F | Resp 18

## 2019-11-10 DIAGNOSIS — N183 Chronic kidney disease, stage 3 unspecified: Secondary | ICD-10-CM

## 2019-11-10 DIAGNOSIS — D631 Anemia in chronic kidney disease: Secondary | ICD-10-CM | POA: Insufficient documentation

## 2019-11-10 DIAGNOSIS — N184 Chronic kidney disease, stage 4 (severe): Secondary | ICD-10-CM | POA: Diagnosis not present

## 2019-11-10 LAB — FERRITIN: Ferritin: 759 ng/mL — ABNORMAL HIGH (ref 11–307)

## 2019-11-10 LAB — IRON AND TIBC
Iron: 60 ug/dL (ref 28–170)
Saturation Ratios: 24 % (ref 10.4–31.8)
TIBC: 255 ug/dL (ref 250–450)
UIBC: 195 ug/dL

## 2019-11-10 LAB — POCT HEMOGLOBIN-HEMACUE: Hemoglobin: 9.9 g/dL — ABNORMAL LOW (ref 12.0–15.0)

## 2019-11-10 MED ORDER — EPOETIN ALFA-EPBX 40000 UNIT/ML IJ SOLN: 25000.0000 [IU] | INTRAMUSCULAR | Status: DC

## 2019-11-10 MED ORDER — CLONIDINE HCL 0.1 MG PO TABS: 0.1000 mg | ORAL_TABLET | Freq: Once | ORAL | Status: DC | PRN

## 2019-11-10 MED ORDER — EPOETIN ALFA-EPBX 10000 UNIT/ML IJ SOLN
INTRAMUSCULAR | Status: AC
  Administered 2019-11-10: 25000 [IU]
  Filled 2019-11-10: qty 3

## 2019-11-15 ENCOUNTER — Telehealth: Payer: Self-pay | Admitting: Internal Medicine

## 2019-11-15 NOTE — Telephone Encounter (Signed)
Lm for patient.  

## 2019-11-16 NOTE — Telephone Encounter (Signed)
Pt returning a phone call. Pt can be reached at [phone].

## 2019-11-16 NOTE — Telephone Encounter (Signed)
Spoke with the pt  She is asking for results of her PFT that was done 10/19/19  She states that she is also needing copy of recent DL for her [place] not working- will check on this when get msg back from Dr Annamaria Boots regarding PFT  Please advise thanks

## 2019-11-16 NOTE — Telephone Encounter (Signed)
We didn't have a download at her last ov.  She was wanting to change at that visit from [facility] to [facility] If that was done, please contact [facility] and see if they can put her in [facility] or otherwise provide a recent download.  If still with [facility], please ask then for same.  Her pulmonary function test on 9/14 had shown  moderate restriction, meaning her lungs wouldn't hold as much air as expected. This can be because of scarring or some other lung disease, but often it is due to obesity. Weight loss can really help.

## 2019-11-16 NOTE — Telephone Encounter (Signed)
Called [facility] and was advised that the pt has not received CPAP from them  [facility] not currently working  Will check tomorrow to see if we can get DL for the pt  Called pt to give PFT results and had to [facility]

## 2019-11-18 ENCOUNTER — Other Ambulatory Visit: Payer: Self-pay

## 2019-11-18 ENCOUNTER — Ambulatory Visit: Payer: Medicare Other | Attending: Orthopedic Surgery

## 2019-11-18 DIAGNOSIS — M25562 Pain in left knee: Secondary | ICD-10-CM | POA: Diagnosis present

## 2019-11-18 DIAGNOSIS — M6281 Muscle weakness (generalized): Secondary | ICD-10-CM | POA: Diagnosis present

## 2019-11-18 DIAGNOSIS — G8929 Other chronic pain: Secondary | ICD-10-CM | POA: Insufficient documentation

## 2019-11-18 DIAGNOSIS — Z9889 Other specified postprocedural states: Secondary | ICD-10-CM | POA: Diagnosis not present

## 2019-11-18 DIAGNOSIS — R262 Difficulty in walking, not elsewhere classified: Secondary | ICD-10-CM | POA: Insufficient documentation

## 2019-11-18 NOTE — Therapy (Addendum)
[place], [place], [address] Phone: [phone]   Fax:  [phone]  Physical Therapy Treatment/Discharge  Patient Details  Name: Tiffany [name] MRN: [id number] Date of Birth: 12-May-1950 Referring Provider (PT): Danae Orleans, PA-C   Encounter Date: 11/18/2019   PT End of Session - 11/18/19 0935    Visit Number 4    Number of Visits 16    Date for PT Re-Evaluation 12/13/19    Authorization Type [employer] Medicare    PT Start Time 0934    PT Stop Time 1015    PT Time Calculation (min) 41 min    Activity Tolerance Patient tolerated treatment well    Behavior During Therapy [facility] for tasks assessed/performed           Past Medical History:  Diagnosis Date  . ALLERGIC RHINITIS   . Arthritis   . Chronic diastolic heart failure ([place]) 05/05/2013   Echo 03/2018:  EF 16-01, +diastolic dysfunction, GLS -20.5%, normal RVSF, RVSP 42.5 (mild elevated), severe LAE, small pericardial eff, mild MAc, mod AV calc, mild PI  . CKD (chronic kidney disease)   . Diabetes mellitus   . Heart murmur    at birth  . Hyperlipidemia   . Hypertension   . Hypothyroid   . Iron deficiency anemia   . Obesity   . OSA on CPAP   . Previous back surgery    x 2  . S/P arthroscopy     Past Surgical History:  Procedure Laterality Date  . BREAST BIOPSY  20+ yrs ago   dont know which breast per pt; benign  . BREAST REDUCTION SURGERY  1982  . KNEE SURGERY     x 2  . LUMBAR DISC SURGERY     x 2  . REDUCTION MAMMAPLASTY Bilateral 1985  . TOE SURGERY    . TOTAL ABDOMINAL HYSTERECTOMY  1997   partial hysterectomy- still has ovaries  . TOTAL KNEE ARTHROPLASTY Left 07/01/2019   Procedure: TOTAL KNEE ARTHROPLASTY;  Surgeon: Paralee Cancel, MD;  Location: [facility];  Service: Orthopedics;  Laterality: Left;  70 min NO BLOOD PRODUCTS!!  . TREATMENT FISTULA ANAL      There were no vitals filed for this visit.   Subjective Assessment -  11/18/19 0936    Subjective Pt reports she has been doing really well and wants to make today her last one. She will think about coming in on Saturdays. It's just too hard to get here during the week right now with work.    Pertinent History L knee arthroscopy 1990's    Limitations Standing;Walking;House hold activities    How long can you sit comfortably? as long as you can    How long can you stand comfortably? do what I gotta do and get off of it    How long can you walk comfortably? a few minutes    Patient Stated Goals To get where I can do things    Currently in Pain? No/denies    Pain Onset More than a month ago                             [facility] Adult PT Treatment/Exercise - 11/18/19 0001      Knee/Hip Exercises: Stretches   Passive Hamstring Stretch Left;1 rep;30 seconds    Passive Hamstring Stretch Limitations c strap    ITB Stretch Left;1 rep;30 seconds    ITB  Stretch Limitations c strap, + ADD    Other Knee/Hip Stretches Seated Knee Ext Mobs c osc 3x30" + ER   reviewed     Knee/Hip Exercises: Aerobic   Nustep L6 6' UE/LE      Knee/Hip Exercises: Seated   Sit to Sand 1 set;10 reps;with UE support      Knee/Hip Exercises: Supine   Quad Sets Strengthening;Both;1 set;10 reps    Quad Sets Limitations 5" ADD    Straight Leg Raises Left;1 set;10 reps    Other Supine Knee/Hip Exercises Clams with GTB 10x5"      Knee/Hip Exercises: Sidelying   Clams L clams x 15                    PT Short Term Goals - 11/18/19 1315      PT SHORT TERM GOAL #1   Title Pt will be I and compliant with initial HEP.    Status Partially Met      PT SHORT TERM GOAL #2   Title Pt will report ability to stand for at least 10 minutes with <5/10 pain.    Status On-going             PT Long Term Goals - 11/18/19 1316      PT LONG TERM GOAL #1   Title Pt will increase knee MMT to 5/5.    Status On-going    Target Date 12/13/19      PT LONG TERM GOAL #2    Title Pt will increase L knee AROM from 0-120 without pain.    Baseline 2 to 108    Status On-going    Target Date 12/13/19      PT LONG TERM GOAL #3   Title Pt will decrease FOTO limitation to <25% disability to show a significant functional change.    Target Date 12/13/19      PT LONG TERM GOAL #4   Title Pt will be able to stand for 20 minutes or greater to perform ADLs and IADLs.    Status On-going    Target Date 12/13/19                 Plan - 11/18/19 0936    Clinical Impression Statement Pt requests to hold therapy for a month due to busy work schedule and feeling better. She no longer has lateral knee pain and tolerate tx well. She has difficulty with stretches due to hypoflexibility and weight of LE. She was provided with additional exercises to HEP of H/L clams and sit to stands to continue to progress LE strength and power production.    Personal Factors and Comorbidities Comorbidity 1;Comorbidity 2;Comorbidity 3+    Comorbidities Hypothyroidism, heart murmur, HLD, HTN, CKD, DM    Examination-Activity Limitations Stand    Examination-Participation Restrictions Community Activity;Cleaning;Laundry;Interpersonal Relationship;Shop;Meal Prep    Stability/Clinical Decision Making Stable/Uncomplicated    Rehab Potential Good    PT Frequency 1x / week   up to 2x   PT Duration 8 weeks    PT Treatment/Interventions ADLs/Self Care Home Management;Cryotherapy;Iontophoresis 54m/ml Dexamethasone;Moist Heat;Electrical Stimulation;Therapeutic activities;Functional mobility training;Stair training;Gait training;Therapeutic exercise;Balance training;Neuromuscular re-education;Taping;Dry needling;Passive range of motion;Vasopneumatic Device;Manual techniques;Scar mobilization;Patient/family education    PT Next Visit Plan Hold for 1 month before DC    PT Home Exercise Plan QS, knee ext mobs, SLR, HS/TFL/ADD S c strap, clams    Consulted and Agree with Plan of Care Patient  Patient will benefit from skilled therapeutic intervention in order to improve the following deficits and impairments:  Difficulty walking, Pain, Postural dysfunction, Increased fascial restricitons, Decreased strength, Decreased activity tolerance, Impaired perceived functional ability, Decreased range of motion, Decreased scar mobility, Hypomobility  Visit Diagnosis: S/P left knee arthroscopy  Chronic pain of left knee  Difficulty in walking, not elsewhere classified  Muscle weakness (generalized)     Problem List Patient Active Problem List   Diagnosis Date Noted  . S/P left TKA 07/01/2019  . Status post total left knee replacement 07/01/2019  . Hyperlipidemia 09/12/2016  . Type 2 diabetes mellitus without complications ([place]) 32/91/9166  . Tachycardia 08/26/2016  . Palpitation 08/26/2016  . SOB (shortness of breath) 08/26/2016  . CKD (chronic kidney disease), stage III ([place]) 04/28/2014  . Chronic diastolic heart failure ([place]) 05/05/2013  . Essential hypertension 05/05/2013  . HEART MURMUR, SYSTOLIC 06/00/4599  . UNSPECIFIED HYPOTHYROIDISM 02/12/2007  . OBESITY 01/08/2007  . Obstructive sleep apnea 01/08/2007  . Seasonal and perennial allergic rhinitis 01/08/2007    Izell [place], PT, DPT 11/18/2019, 1:17 PM  [facility] [address] [place], [place], [address] Phone: [phone]   Fax:  [phone]  Name: Tiffany [name] MRN: [id number] Date of Birth: 04-Mar-1950   PHYSICAL THERAPY DISCHARGE SUMMARY  Visits from Start of Care: 4  Current functional level related to goals / functional outcomes: unknown   Remaining deficits: unknown   Education / Equipment: HEP, theraband  Plan: Patient agrees to discharge.  Patient goals were partially met. Patient is being discharged due to not returning since the last visit.  ?????        Phill Myron. Yvette Rack, PT, DPT

## 2019-11-18 NOTE — Telephone Encounter (Signed)
lmtcb for pt x 2. Two calls have been attempted without reaching pt. Will sign off on message per triage protocol.

## 2019-11-23 ENCOUNTER — Ambulatory Visit: Payer: Medicare Other

## 2019-11-24 ENCOUNTER — Other Ambulatory Visit: Payer: Self-pay

## 2019-11-24 ENCOUNTER — Ambulatory Visit (HOSPITAL_COMMUNITY)
Admission: RE | Admit: 2019-11-24 | Discharge: 2019-11-24 | Disposition: A | Discharge status: Home / Self Care | Payer: Medicare Other | Source: Ambulatory Visit | Attending: Internal Medicine | Admitting: Internal Medicine

## 2019-11-24 VITALS — BP 146/76 | HR 56 | Temp 96.9°F | Resp 18

## 2019-11-24 DIAGNOSIS — N183 Chronic kidney disease, stage 3 unspecified: Secondary | ICD-10-CM

## 2019-11-24 LAB — POCT HEMOGLOBIN-HEMACUE: Hemoglobin: 9.7 g/dL — ABNORMAL LOW (ref 12.0–15.0)

## 2019-11-24 MED ORDER — EPOETIN ALFA-EPBX 3000 UNIT/ML IJ SOLN
INTRAMUSCULAR | Status: AC
  Administered 2019-11-24: 3000 [IU] via SUBCUTANEOUS
  Filled 2019-11-24: qty 1

## 2019-11-24 MED ORDER — EPOETIN ALFA-EPBX 10000 UNIT/ML IJ SOLN
INTRAMUSCULAR | Status: AC
  Administered 2019-11-24: 20000 [IU] via SUBCUTANEOUS
  Filled 2019-11-24: qty 2

## 2019-11-24 MED ORDER — EPOETIN ALFA-EPBX 40000 UNIT/ML IJ SOLN: 25000.0000 [IU] | INTRAMUSCULAR | Status: DC

## 2019-11-24 MED ORDER — EPOETIN ALFA-EPBX 2000 UNIT/ML IJ SOLN
INTRAMUSCULAR | Status: AC
  Administered 2019-11-24: 2000 [IU] via SUBCUTANEOUS
  Filled 2019-11-24: qty 1

## 2019-11-29 ENCOUNTER — Telehealth: Payer: Self-pay | Admitting: Internal Medicine

## 2019-11-29 NOTE — Telephone Encounter (Signed)
Pulmonary Function Test- showed restriction, meaning that her lungs don't hold as much air as expected. This can be from heart failure and obesity. We will discuss at next office visit. I recommend walking for exercise. This will help encourage lung expansion.

## 2019-11-29 NOTE — Telephone Encounter (Signed)
Pt calling requesting to know the results of the PFT which was performed 10/19/19. Dr. Annamaria Boots, please advise.

## 2019-11-29 NOTE — Telephone Encounter (Signed)
Called and spoke with pt letting her know the results of the PFT per CY and pt verbalized understanding. Nothing further needed.

## 2019-12-08 ENCOUNTER — Other Ambulatory Visit: Payer: Self-pay

## 2019-12-08 ENCOUNTER — Encounter (HOSPITAL_COMMUNITY)
Admission: RE | Admit: 2019-12-08 | Discharge: 2019-12-08 | Disposition: A | Discharge status: Home / Self Care | Payer: Medicare Other | Source: Ambulatory Visit | Attending: Internal Medicine | Admitting: Internal Medicine

## 2019-12-08 VITALS — BP 144/70 | HR 56 | Temp 97.3°F | Resp 18

## 2019-12-08 DIAGNOSIS — N183 Chronic kidney disease, stage 3 unspecified: Secondary | ICD-10-CM | POA: Diagnosis not present

## 2019-12-08 DIAGNOSIS — D631 Anemia in chronic kidney disease: Secondary | ICD-10-CM | POA: Insufficient documentation

## 2019-12-08 LAB — RENAL FUNCTION PANEL
Albumin: 3.3 g/dL — ABNORMAL LOW (ref 3.5–5.0)
Anion gap: 13 (ref 5–15)
BUN: 102 mg/dL — ABNORMAL HIGH (ref 8–23)
CO2: 22 mmol/L (ref 22–32)
Calcium: 8.9 mg/dL (ref 8.9–10.3)
Chloride: 106 mmol/L (ref 98–111)
Creatinine, Ser: 3.23 mg/dL — ABNORMAL HIGH (ref 0.44–1.00)
GFR, Estimated: 15 mL/min — ABNORMAL LOW (ref >60)
Glucose, Bld: 112 mg/dL — ABNORMAL HIGH (ref 70–99)
Phosphorus: 5 mg/dL — ABNORMAL HIGH (ref 2.5–4.6)
Potassium: 4.3 mmol/L (ref 3.5–5.1)
Sodium: 141 mmol/L (ref 135–145)

## 2019-12-08 LAB — IRON AND TIBC
Iron: 82 ug/dL (ref 28–170)
Saturation Ratios: 29 % (ref 10.4–31.8)
TIBC: 279 ug/dL (ref 250–450)
UIBC: 197 ug/dL

## 2019-12-08 LAB — POCT HEMOGLOBIN-HEMACUE: Hemoglobin: 10.4 g/dL — ABNORMAL LOW (ref 12.0–15.0)

## 2019-12-08 LAB — FERRITIN: Ferritin: 716 ng/mL — ABNORMAL HIGH (ref 11–307)

## 2019-12-08 MED ORDER — EPOETIN ALFA-EPBX 40000 UNIT/ML IJ SOLN: 30000.0000 [IU] | INTRAMUSCULAR | Status: DC

## 2019-12-08 MED ORDER — EPOETIN ALFA-EPBX 10000 UNIT/ML IJ SOLN
INTRAMUSCULAR | Status: AC
  Administered 2019-12-08: 30000 [IU] via SUBCUTANEOUS
  Filled 2019-12-08: qty 3

## 2019-12-09 LAB — PTH, INTACT AND CALCIUM
Calcium, Total (PTH): 8.9 mg/dL (ref 8.7–10.3)
PTH: 346 pg/mL — ABNORMAL HIGH (ref 15–65)

## 2019-12-22 ENCOUNTER — Encounter (HOSPITAL_COMMUNITY)
Admission: RE | Admit: 2019-12-22 | Discharge: 2019-12-22 | Disposition: A | Discharge status: Home / Self Care | Payer: Medicare Other | Source: Ambulatory Visit | Attending: Internal Medicine | Admitting: Internal Medicine

## 2019-12-22 ENCOUNTER — Other Ambulatory Visit: Payer: Self-pay

## 2019-12-22 VITALS — BP 162/71 | HR 55 | Temp 97.2°F | Resp 18

## 2019-12-22 DIAGNOSIS — N183 Chronic kidney disease, stage 3 unspecified: Secondary | ICD-10-CM

## 2019-12-22 LAB — POCT HEMOGLOBIN-HEMACUE: Hemoglobin: 9.8 g/dL — ABNORMAL LOW (ref 12.0–15.0)

## 2019-12-22 MED ORDER — EPOETIN ALFA-EPBX 10000 UNIT/ML IJ SOLN
INTRAMUSCULAR | Status: AC
  Administered 2019-12-22: 30000 [IU]
  Filled 2019-12-22: qty 3

## 2019-12-22 MED ORDER — EPOETIN ALFA-EPBX 40000 UNIT/ML IJ SOLN: 30000.0000 [IU] | INTRAMUSCULAR | Status: DC

## 2020-01-05 ENCOUNTER — Other Ambulatory Visit: Payer: Self-pay

## 2020-01-05 ENCOUNTER — Encounter (HOSPITAL_COMMUNITY)
Admission: RE | Admit: 2020-01-05 | Discharge: 2020-01-05 | Disposition: A | Discharge status: Home / Self Care | Payer: Medicare Other | Source: Ambulatory Visit | Attending: Internal Medicine | Admitting: Internal Medicine

## 2020-01-05 VITALS — BP 140/71 | HR 57 | Temp 97.1°F | Resp 18

## 2020-01-05 DIAGNOSIS — N183 Chronic kidney disease, stage 3 unspecified: Secondary | ICD-10-CM | POA: Diagnosis not present

## 2020-01-05 MED ORDER — EPOETIN ALFA-EPBX 10000 UNIT/ML IJ SOLN
INTRAMUSCULAR | Status: AC
  Administered 2020-01-05: 30000 [IU]
  Filled 2020-01-05: qty 3

## 2020-01-05 MED ORDER — EPOETIN ALFA-EPBX 40000 UNIT/ML IJ SOLN: 30000.0000 [IU] | INTRAMUSCULAR | Status: DC

## 2020-01-19 ENCOUNTER — Other Ambulatory Visit: Payer: Self-pay

## 2020-01-19 ENCOUNTER — Encounter (HOSPITAL_COMMUNITY)
Admission: RE | Admit: 2020-01-19 | Discharge: 2020-01-19 | Disposition: A | Discharge status: Home / Self Care | Payer: Medicare Other | Source: Ambulatory Visit | Attending: Internal Medicine | Admitting: Internal Medicine

## 2020-01-19 VITALS — BP 145/73 | HR 58 | Temp 97.3°F | Resp 18

## 2020-01-19 DIAGNOSIS — N183 Chronic kidney disease, stage 3 unspecified: Secondary | ICD-10-CM

## 2020-01-19 LAB — POCT HEMOGLOBIN-HEMACUE: Hemoglobin: 10.5 g/dL — ABNORMAL LOW (ref 12.0–15.0)

## 2020-01-19 MED ORDER — EPOETIN ALFA-EPBX 40000 UNIT/ML IJ SOLN
30000.0000 [IU] | INTRAMUSCULAR | Status: DC
  Administered 2020-01-19: 30000 [IU] via SUBCUTANEOUS

## 2020-01-19 MED ORDER — EPOETIN ALFA-EPBX 40000 UNIT/ML IJ SOLN
INTRAMUSCULAR | Status: AC
  Filled 2020-01-19: qty 1

## 2020-01-24 ENCOUNTER — Telehealth: Payer: Self-pay | Admitting: Interventional Cardiology

## 2020-01-24 NOTE — Telephone Encounter (Signed)
Spoke with pt complaining of SOB, fatigue, "chest heaviness" and LE edema worsening x 1 month.  Pt states she is unable to sleep unless she is sitting up.  Pt reports she is complaint with medications.  Pt has history of CHF and on Torsemide.  Reviewed ED precautions.  Appointment scheduled with Kathrene Alu for 01/24/2021.  Pt verbalizes understanding and agrees with current plan.

## 2020-01-24 NOTE — Telephone Encounter (Signed)
Pt c/o of Chest Pain: STAT if CP now or developed within 24 hours  1. Are you having CP right now? Yes   2. Are you experiencing any other symptoms (ex. SOB, nausea, vomiting, sweating)? SOB   3. How long have you been experiencing CP? States it has been going on for awhile   4. Is your CP continuous or coming and going? Continuous   5. Have you taken Nitroglycerin? No   States it is pressure not pain. Tiffany [name] is wanting an appt in regards to this. Please advise.  ?

## 2020-01-24 NOTE — Progress Notes (Signed)
CARDIOLOGY OFFICE NOTE  Date:  01/25/2020    Tiffany [name] Date of Birth: August 25, 1950 Medical Record #[id number]  PCP:  Nolene Ebbs, MD  Cardiologist:  Tamala Julian   Chief Complaint  Patient presents with  . Shortness of Breath    Work in visit - seen for Dr. Tamala Julian    History of Present Illness: Tiffany [name] is a 69 y.o. female who presents today for a work in visit. Seen for Dr. Tamala Julian.   She has a history of HTN, chronic diastolic heart failure, obesity, chronic kidney disease stage, IDA on (iron infusion intermittently), HLD and OSA on CPAP. No known CAD noted.   Prior shortness of breath felt multifactorial and related to diastolic heart failure, anemia, deconditioning and obesity.  Echocardiogram February 2020 showed normal LV function, impaired diastolic relaxation with elevated LVEDP, mildly elevated RVSP.  Last seen by Dr. Tamala Julian 05/2018.  Seen by Cephus Shelling back this past April for pre op clearance for knee surgery. Has chronic edema - on 100 mg of Torsemide.   Phone call yesterday - "Spoke with pt complaining of SOB, fatigue, "chest heaviness" and LE edema worsening x 1 month.  Pt states she is unable to sleep unless she is sitting up.  Pt reports she is complaint with medications.  Pt has history of CHF and on Torsemide.  Reviewed ED precautions.  Appointment scheduled with Kathrene Alu for 01/24/2021.  Pt verbalizes understanding and agrees with current plan."  Thus added to my schedule for today.    Comes in today. Here alone. Short of breath with walking in. She says she "can hardly breath". Has to stop to rest. She has been short of breath at least over the past year - but now much worse. No chest pain/discomfort. Can't lie flat. Rare cough. No hemoptysis. Chronic edema. This seems to about the same per her report. Some heart skipping. No syncope. Little dizzy and lightheaded at times. Hydralazine was just started by [facility] last month. Still making urine. She is  alternating between 100 and 150 mg of torsemide every other day.  She is on high dose Norvasc 10 mg twice a day. No recent travel. 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   Past Medical History:  Diagnosis Date  . ALLERGIC RHINITIS   . Arthritis   . Chronic diastolic heart failure ([place]) 05/05/2013   Echo 03/2018:  EF 79-39, +diastolic dysfunction, GLS -20.5%, normal RVSF, RVSP 42.5 (mild elevated), severe LAE, small pericardial eff, mild MAc, mod AV calc, mild PI  . CKD (chronic kidney disease)   . Diabetes mellitus   . Heart murmur    at birth  . Hyperlipidemia   . Hypertension   . Hypothyroid   . Iron deficiency anemia   . Obesity   . OSA on CPAP   . Previous back surgery    x 2  . S/P arthroscopy     Past Surgical History:  Procedure Laterality Date  . BREAST BIOPSY  20+ yrs ago   dont know which breast per pt; benign  . BREAST REDUCTION SURGERY  1982  . KNEE SURGERY     x 2  . LUMBAR DISC SURGERY     x 2  . REDUCTION MAMMAPLASTY Bilateral 1985  . TOE SURGERY    . TOTAL ABDOMINAL HYSTERECTOMY  1997   partial hysterectomy- still has ovaries  . TOTAL KNEE ARTHROPLASTY Left 07/01/2019   Procedure: TOTAL KNEE  ARTHROPLASTY;  Surgeon: Paralee Cancel, MD;  Location: [facility];  Service: Orthopedics;  Laterality: Left;  70 min NO BLOOD PRODUCTS!!  . TREATMENT FISTULA ANAL       Medications: Current Meds  Medication Sig  . acetaminophen (TYLENOL) 650 MG CR tablet Take 650 mg by mouth every 8 (eight) hours as needed for pain.  Marland Kitchen albuterol (VENTOLIN HFA) 108 (90 Base) MCG/ACT inhaler Inhale 1-2 puffs into the lungs every 6 (six) hours as needed for wheezing or shortness of breath.  . allopurinol (ZYLOPRIM) 100 MG tablet Take 100 mg by mouth 2 (two) times daily.  Marland Kitchen amLODipine (NORVASC) 10 MG tablet Take 10 mg by mouth 2 (two) times daily.   . B Complex Vitamins (B COMPLEX PO) Take 1 tablet by mouth daily.  . B-D INS SYRINGE 0.5CC/30GX1/2" 30G X 1/2"  0.5 ML MISC TAKE AS DIRECTED TWICE A DAY  . BIOTIN 5000 PO Take 5,000 mcg by mouth daily.   . calcitRIOL (ROCALTROL) 0.25 MCG capsule Take 0.25 mcg by mouth daily at 6 PM. (1700)  . carvedilol (COREG) 6.25 MG tablet Take 6.25 mg by mouth in the morning and at bedtime.  . cetirizine (ZYRTEC) 10 MG tablet Take 10 mg by mouth daily as needed for allergies.  . cloNIDine (CATAPRES - DOSED IN MG/24 HR) 0.3 mg/24hr Place 0.3 mg onto the skin every Friday.   Marland Kitchen co-enzyme Q-10 30 MG capsule Take 100 mg by mouth daily.  . hydrALAZINE (APRESOLINE) 25 MG tablet Take 25 mg by mouth 3 (three) times daily.  . insulin glargine (LANTUS) 100 UNIT/ML injection Inject 18 Units into the skin at bedtime.   Marland Kitchen levothyroxine (SYNTHROID) 112 MCG tablet Take 224-336 mcg by mouth See admin instructions. Take 3 tablets (336 mcg) by mouth on Sundays at 2200 & take 2 tablets (224 mcg) by mouth on Mondays, Tuesdays, Wednesdays, Thursdays, Fridays, & Saturdays at 2200.  . Multiple Vitamins-Minerals (PRESERVISION AREDS PO) Take 1 tablet by mouth in the morning and at bedtime.  . NON FORMULARY Take 1 tablet by mouth daily. Renadyl for kidneys  . Omega-3 Fatty Acids (FISH OIL) 500 MG CAPS Take 500 mg by mouth daily in the afternoon.  . polyethylene glycol (MIRALAX / GLYCOLAX) 17 g packet Take 17 g by mouth 2 (two) times daily.  . rosuvastatin (CRESTOR) 10 MG tablet Take 10 mg by mouth every evening.  Marland Kitchen SYSTANE COMPLETE 0.6 % SOLN Place 1 drop into both eyes in the morning, at noon, in the evening, and at bedtime.  . torsemide (DEMADEX) 100 MG tablet Take 100-150 mg by mouth See admin instructions. Alternating between 1 tablet (100 mg) & 1.5 tablets (150 mg) every other day  . TURMERIC PO Take 1 tablet by mouth daily.  Marland Kitchen zolpidem (AMBIEN) 5 MG tablet 1 at bedtime as needed for sleep     Allergies: No Known Allergies  Social History: The patient  reports that she has never smoked. She has never used smokeless tobacco. She  reports current alcohol use. She reports that she does not use drugs.   Family History: The patient's family history includes Congestive Heart Failure in her mother; Diabetes in her sister; Other in her sister; Prostate cancer in her father.   Review of Systems: Please see the history of present illness.   All other systems are reviewed and negative.   Physical Exam: VS:  BP (!) 158/80 (BP Location: Left Arm, Patient Position: Sitting, Cuff Size: Large)   Pulse 65  Ht _0  (1.778 m)   Wt 270 lb 9.6 oz (122.7 kg)   SpO2 96% Comment: at rest  BMI 38.83 kg/m  .  BMI Body mass index is 38.83 kg/m.  Wt Readings from Last 3 Encounters:  01/25/20 270 lb 9.6 oz (122.7 kg)  09/24/19 254 lb 12.8 oz (115.6 kg)  07/01/19 271 lb 6 oz (123.1 kg)    General: Alert. She was short of breath with walking in. She is morbidly obese. She was 262# when here in April.    Neck: Supple, no JVD, carotid bruits, or masses noted.  Cardiac: Regular rate and rhythm. Outflow murmur noted. Massive edema of the lower legs.  Respiratory:  Lungs are clear to auscultation bilaterally with normal work of breathing.  GI: Soft and nontender.  MS: No deformity or atrophy. Gait and ROM intact.  Skin: Warm and dry. Color is normal.  Neuro:  Strength and sensation are intact and no gross focal deficits noted.  Psych: Alert, appropriate and with normal affect.   LABORATORY DATA:  EKG:  EKG is ordered today.  Personally reviewed by me. This demonstrates NSR.  Lab Results  Component Value Date   WBC 6.0 07/02/2019   HGB 10.5 (L) 01/19/2020   HCT 25.8 (L) 07/02/2019   PLT 117 (L) 07/02/2019   GLUCOSE 112 (H) 12/08/2019   NA 141 12/08/2019   K 4.3 12/08/2019   CL 106 12/08/2019   CREATININE 3.23 (H) 12/08/2019   BUN 102 (H) 12/08/2019   CO2 22 12/08/2019   TSH 0.058 (L) 08/26/2017   INR 1.0 05/05/2008   HGBA1C 6.3 (H) 06/23/2019       BNP (last 3 results) No results for input(s): BNP in the last 8760  hours.  ProBNP (last 3 results) No results for input(s): PROBNP in the last 8760 hours.   Other Studies Reviewed Today:  Echocardiogram: 03/2018 1. The left ventricle has normal systolic function of 78-29%. The cavity  size is normal. There is no increased left ventricular wall thickness.  Echo evidence of impaired diastolic relaxation Elevated left ventricular  end-diastolic pressure.  2. Global longitudinal strain -20.5% (normal).  3. The right ventricle has normal systolic function. The cavity in normal  in size. There is no increase in right ventricular wall thickness. Right  ventricular systolic pressure is mildly elevated with an estimated  pressure of 42.5 mmHg.  4. Severely dilated left atrial size.  5. Small pericardial effusion.  6. The pericardial effusion is posterior to the left ventricle.  7. The mitral valve is normal in structure There is mild mitral annular  calcification present.  8. The aortic valve is tricuspid. There is moderate thickening and  moderate calcification of the aortic valve.  9. The pulmonic valve is normal in structure. Pulmonic valve  regurgitation is mild by color flow Doppler.  10. The ascending aorta and aortic root are normal in size and structure.  11. The inferior vena cava was dilated in size with <50% respiratory  variability.     ASSESSMENT & PLAN:   1. Worsening SOB - with chronic diastolic HF - obesity - probably multifactorial but has murmur on exam - no known CAD but multiple risk factors - will check lab - arrange for echo and Myoview.  [id number]} The risks [chest pain, shortness of breath, cardiac arrhythmias, dizziness, blood pressure fluctuations, myocardial infarction, stroke/transient ischemic attack, nausea, vomiting, allergic reaction, radiation exposure, metallic taste sensation and life-threatening complications (estimated to be 1 in 10,000)],  benefits (risk stratification, diagnosing coronary artery disease,  treatment guidance) and alternatives of a nuclear stress test were discussed in detail with Ms. Tabar and she agrees to proceed.  She will need a 2 day Myoview. Further disposition to follow.   2. Obesity - chronic issue.   3. CKD - last BUN pretty high (>100) - on very high doses of diuretics - she says she will not go on dialysis.   4. HTN -  Not controlled - has had Hydralazine added - unclear to me the use of high dose Norvasc BID.   5. HLD - not discussed.   6. OSA - on CPAP  7. Jehovah's Witness - she does not accept blood products. I have added this to her history.    Current medicines are reviewed with the patient today.  The patient does not have concerns regarding medicines other than what has been noted above.  The following changes have been made:  See above.  Labs/ tests ordered today include:    Orders Placed This Encounter  Procedures  . Basic metabolic panel  . CBC  . Pro b natriuretic peptide (BNP)  . Cardiac Stress Test: Informed Consent Details: Physician/Practitioner Attestation; Transcribe to consent form and obtain patient signature  . MYOCARDIAL PERFUSION IMAGING  . ECHOCARDIOGRAM COMPLETE     Disposition:   FU with Dr. Tamala Julian after studies complete - overall prognosis tenuous to me.     Patient is agreeable to this plan and will call if any problems develop in the interim.   SignedTruitt Merle, NP  01/25/2020 2:56 PM  [place] [address] [place], [place]  [address] Phone: [phone] Fax: [phone]

## 2020-01-25 ENCOUNTER — Other Ambulatory Visit: Payer: Self-pay

## 2020-01-25 ENCOUNTER — Ambulatory Visit: Payer: Medicare Other | Admitting: Nurse Practitioner

## 2020-01-25 ENCOUNTER — Encounter: Payer: Self-pay | Admitting: Nurse Practitioner

## 2020-01-25 VITALS — BP 158/80 | HR 65 | Ht 70.0 in | Wt 270.6 lb

## 2020-01-25 DIAGNOSIS — I5032 Chronic diastolic (congestive) heart failure: Secondary | ICD-10-CM

## 2020-01-25 DIAGNOSIS — I10 Essential (primary) hypertension: Secondary | ICD-10-CM | POA: Diagnosis not present

## 2020-01-25 DIAGNOSIS — N1831 Chronic kidney disease, stage 3a: Secondary | ICD-10-CM | POA: Diagnosis not present

## 2020-01-25 DIAGNOSIS — D508 Other iron deficiency anemias: Secondary | ICD-10-CM

## 2020-01-25 DIAGNOSIS — R011 Cardiac murmur, unspecified: Secondary | ICD-10-CM

## 2020-01-25 DIAGNOSIS — G4733 Obstructive sleep apnea (adult) (pediatric): Secondary | ICD-10-CM

## 2020-01-25 MED ORDER — ISOSORBIDE MONONITRATE ER 30 MG PO TB24: 30.0000 mg | ORAL_TABLET | Freq: Every day | ORAL | 3 refills | Status: DC

## 2020-01-25 NOTE — Patient Instructions (Addendum)
  After Visit Summary:  We will be checking the following labs today - BMET, CBC, BNP   Medication Instructions:    Continue with your current medicines. BUT  I am starting Imdur 30 mg a day - this may cause a headache - if so, take Tylenol - ok to take this at night.    If you need a refill on your cardiac medications before your next appointment, please call your pharmacy.     Testing/Procedures To Be Arranged:  Echocardiogram  Lexiscan Myoview   You are scheduled for a Myocardial Perfusion Imaging Study on ________________________________ at ____________________________________.   Please arrive 15 minutes prior to your appointment time for registration and insurance purposes.   The test will take approximately 3 to 4 hours to complete; you may bring reading material. If someone comes with you to your appointment, they will need to remain in the main lobby due to limited space in the testing area.   How to prepare for your Myocardial Perfusion test:   Do not eat or drink 3 hours prior to your test, except you may have water.    Do not consume products containing caffeine (regular or decaffeinated) 12 hours prior to your test (ex: coffee, chocolate, soda, tea)   Do bring a list of your current medications with you. If not listed below, you may take your medications as normal.    Bring any held medication to your appointment, as you may be required to take it once the test is complete.   Do wear comfortable clothes (no dresses or overalls) and walking shoes. Tennis shoes are preferred. No heels or open toed shoes.  Do not wear cologne, perfume, aftershave or lotions (deodorant is allowed).   If these instructions are not followed, you test will have to be rescheduled.   Please report to [address] for your test. If you have questions or concerns about your appointment, please call the Nuclear Lab at [phone].  If you cannot keep your  appointment, please provide 24 hour notification to the Nuclear lab to avoid a possible $50 charge to your account.     Follow-Up:   See Dr. Tamala Julian after your studies are complete    At [facility], you and your health needs are our priority.  As part of our continuing mission to provide you with exceptional heart care, we have created designated Provider Care Teams.  These Care Teams include your primary Cardiologist (physician) and Advanced Practice Providers (APPs -  Physician Assistants and Nurse Practitioners) who all work together to provide you with the care you need, when you need it.  Special Instructions:  . Stay safe, wash your hands for at least 20 seconds and wear a mask when needed.  . It was good to talk with you today.    Call the [place] office at [phone] if you have any questions, problems or concerns.

## 2020-01-26 ENCOUNTER — Ambulatory Visit (HOSPITAL_COMMUNITY): Payer: Medicare Other | Attending: Cardiovascular Disease

## 2020-01-26 ENCOUNTER — Telehealth: Payer: Self-pay | Admitting: Interventional Cardiology

## 2020-01-26 ENCOUNTER — Other Ambulatory Visit: Payer: Self-pay

## 2020-01-26 DIAGNOSIS — R011 Cardiac murmur, unspecified: Secondary | ICD-10-CM | POA: Insufficient documentation

## 2020-01-26 DIAGNOSIS — I10 Essential (primary) hypertension: Secondary | ICD-10-CM | POA: Insufficient documentation

## 2020-01-26 DIAGNOSIS — G4733 Obstructive sleep apnea (adult) (pediatric): Secondary | ICD-10-CM | POA: Insufficient documentation

## 2020-01-26 DIAGNOSIS — N1831 Chronic kidney disease, stage 3a: Secondary | ICD-10-CM

## 2020-01-26 DIAGNOSIS — D508 Other iron deficiency anemias: Secondary | ICD-10-CM

## 2020-01-26 DIAGNOSIS — I5032 Chronic diastolic (congestive) heart failure: Secondary | ICD-10-CM | POA: Diagnosis not present

## 2020-01-26 LAB — BASIC METABOLIC PANEL
BUN/Creatinine Ratio: 36 — ABNORMAL HIGH (ref 12–28)
BUN: 108 mg/dL (ref 8–27)
CO2: 21 mmol/L (ref 20–29)
Calcium: 9.7 mg/dL (ref 8.7–10.3)
Chloride: 99 mmol/L (ref 96–106)
Creatinine, Ser: 3.02 mg/dL — ABNORMAL HIGH (ref 0.57–1.00)
GFR calc Af Amer: 18 mL/min/{1.73_m2} — ABNORMAL LOW (ref >59)
GFR calc non Af Amer: 15 mL/min/{1.73_m2} — ABNORMAL LOW (ref >59)
Glucose: 147 mg/dL — ABNORMAL HIGH (ref 65–99)
Potassium: 4.8 mmol/L (ref 3.5–5.2)
Sodium: 137 mmol/L (ref 134–144)

## 2020-01-26 LAB — CBC
Hematocrit: 32.8 % — ABNORMAL LOW (ref 34.0–46.6)
Hemoglobin: 10.3 g/dL — ABNORMAL LOW (ref 11.1–15.9)
MCH: 26.7 pg (ref 26.6–33.0)
MCHC: 31.4 g/dL — ABNORMAL LOW (ref 31.5–35.7)
MCV: 85 fL (ref 79–97)
Platelets: 186 10*3/uL (ref 150–450)
RBC: 3.86 x10E6/uL (ref 3.77–5.28)
RDW: 17.6 % — ABNORMAL HIGH (ref 11.7–15.4)
WBC: 4.8 10*3/uL (ref 3.4–10.8)

## 2020-01-26 LAB — PRO B NATRIURETIC PEPTIDE: NT-Pro BNP: 2507 pg/mL — ABNORMAL HIGH (ref 0–301)

## 2020-01-26 MED ORDER — TECHNETIUM TC 99M TETROFOSMIN IV KIT
30.9000 | PACK | Freq: Once | INTRAVENOUS | Status: AC | PRN
  Administered 2020-01-26: 30.9 via INTRAVENOUS
  Filled 2020-01-26: qty 31

## 2020-01-26 MED ORDER — REGADENOSON 0.4 MG/5ML IV SOLN
0.4000 mg | Freq: Once | INTRAVENOUS | Status: AC
  Administered 2020-01-26: 0.4 mg via INTRAVENOUS

## 2020-01-26 NOTE — Addendum Note (Signed)
Addended by: Lanna Poche R on: 01/26/2020 02:12 PM   Modules accepted: Orders

## 2020-01-26 NOTE — Telephone Encounter (Signed)
Patient is returning Danielle's call about results. Please advise.

## 2020-01-27 ENCOUNTER — Ambulatory Visit (HOSPITAL_COMMUNITY): Payer: Medicare Other

## 2020-01-27 ENCOUNTER — Ambulatory Visit (HOSPITAL_COMMUNITY): Payer: Medicare Other | Attending: Cardiology

## 2020-01-27 LAB — MYOCARDIAL PERFUSION IMAGING
LV dias vol: 136 mL (ref 46–106)
LV sys vol: 44 mL
Peak HR: 68 {beats}/min
Rest HR: 59 {beats}/min
SDS: 1
SRS: 0
SSS: 1
TID: 1.05

## 2020-01-27 MED ORDER — TECHNETIUM TC 99M TETROFOSMIN IV KIT
31.6000 | PACK | Freq: Once | INTRAVENOUS | Status: AC | PRN
  Administered 2020-01-27: 31.6 via INTRAVENOUS
  Filled 2020-01-27: qty 32

## 2020-02-01 ENCOUNTER — Emergency Department (HOSPITAL_COMMUNITY): Payer: Medicare Other

## 2020-02-01 ENCOUNTER — Inpatient Hospital Stay (HOSPITAL_COMMUNITY)
Admission: EM | Admit: 2020-02-01 | Discharge: 2020-02-04 | DRG: 291 | Disposition: A | Discharge status: Home Health | Payer: Medicare Other | Attending: Family Medicine | Admitting: Family Medicine

## 2020-02-01 ENCOUNTER — Encounter (HOSPITAL_COMMUNITY): Payer: Self-pay

## 2020-02-01 ENCOUNTER — Other Ambulatory Visit: Payer: Self-pay

## 2020-02-01 DIAGNOSIS — D631 Anemia in chronic kidney disease: Secondary | ICD-10-CM | POA: Diagnosis present

## 2020-02-01 DIAGNOSIS — I509 Heart failure, unspecified: Secondary | ICD-10-CM

## 2020-02-01 DIAGNOSIS — G4733 Obstructive sleep apnea (adult) (pediatric): Secondary | ICD-10-CM | POA: Diagnosis present

## 2020-02-01 DIAGNOSIS — Z90711 Acquired absence of uterus with remaining cervical stump: Secondary | ICD-10-CM

## 2020-02-01 DIAGNOSIS — Z833 Family history of diabetes mellitus: Secondary | ICD-10-CM

## 2020-02-01 DIAGNOSIS — E1122 Type 2 diabetes mellitus with diabetic chronic kidney disease: Secondary | ICD-10-CM | POA: Diagnosis present

## 2020-02-01 DIAGNOSIS — D696 Thrombocytopenia, unspecified: Secondary | ICD-10-CM | POA: Diagnosis present

## 2020-02-01 DIAGNOSIS — N184 Chronic kidney disease, stage 4 (severe): Secondary | ICD-10-CM | POA: Diagnosis present

## 2020-02-01 DIAGNOSIS — Z79899 Other long term (current) drug therapy: Secondary | ICD-10-CM | POA: Diagnosis not present

## 2020-02-01 DIAGNOSIS — N179 Acute kidney failure, unspecified: Secondary | ICD-10-CM | POA: Diagnosis present

## 2020-02-01 DIAGNOSIS — Z8249 Family history of ischemic heart disease and other diseases of the circulatory system: Secondary | ICD-10-CM

## 2020-02-01 DIAGNOSIS — I5033 Acute on chronic diastolic (congestive) heart failure: Secondary | ICD-10-CM | POA: Diagnosis present

## 2020-02-01 DIAGNOSIS — Z79890 Hormone replacement therapy (postmenopausal): Secondary | ICD-10-CM

## 2020-02-01 DIAGNOSIS — I130 Hypertensive heart and chronic kidney disease with heart failure and stage 1 through stage 4 chronic kidney disease, or unspecified chronic kidney disease: Secondary | ICD-10-CM | POA: Diagnosis not present

## 2020-02-01 DIAGNOSIS — Z20822 Contact with and (suspected) exposure to covid-19: Secondary | ICD-10-CM | POA: Diagnosis present

## 2020-02-01 DIAGNOSIS — E039 Hypothyroidism, unspecified: Secondary | ICD-10-CM | POA: Diagnosis present

## 2020-02-01 DIAGNOSIS — Z8042 Family history of malignant neoplasm of prostate: Secondary | ICD-10-CM

## 2020-02-01 DIAGNOSIS — E785 Hyperlipidemia, unspecified: Secondary | ICD-10-CM | POA: Diagnosis present

## 2020-02-01 DIAGNOSIS — Z6838 Body mass index (BMI) 38.0-38.9, adult: Secondary | ICD-10-CM

## 2020-02-01 DIAGNOSIS — R001 Bradycardia, unspecified: Secondary | ICD-10-CM | POA: Diagnosis present

## 2020-02-01 DIAGNOSIS — Z794 Long term (current) use of insulin: Secondary | ICD-10-CM | POA: Diagnosis not present

## 2020-02-01 DIAGNOSIS — Z96652 Presence of left artificial knee joint: Secondary | ICD-10-CM | POA: Diagnosis present

## 2020-02-01 DIAGNOSIS — I5031 Acute diastolic (congestive) heart failure: Secondary | ICD-10-CM

## 2020-02-01 DIAGNOSIS — E669 Obesity, unspecified: Secondary | ICD-10-CM | POA: Diagnosis present

## 2020-02-01 LAB — BASIC METABOLIC PANEL
Anion gap: 13 (ref 5–15)
BUN: 121 mg/dL — ABNORMAL HIGH (ref 8–23)
CO2: 23 mmol/L (ref 22–32)
Calcium: 9.6 mg/dL (ref 8.9–10.3)
Chloride: 104 mmol/L (ref 98–111)
Creatinine, Ser: 3.68 mg/dL — ABNORMAL HIGH (ref 0.44–1.00)
GFR, Estimated: 13 mL/min — ABNORMAL LOW (ref >60)
Glucose, Bld: 119 mg/dL — ABNORMAL HIGH (ref 70–99)
Potassium: 4.3 mmol/L (ref 3.5–5.1)
Sodium: 140 mmol/L (ref 135–145)

## 2020-02-01 LAB — URINALYSIS, ROUTINE W REFLEX MICROSCOPIC
Bilirubin Urine: NEGATIVE
Glucose, UA: NEGATIVE mg/dL
Hgb urine dipstick: NEGATIVE
Ketones, ur: NEGATIVE mg/dL
Leukocytes,Ua: NEGATIVE
Nitrite: NEGATIVE
Protein, ur: 100 mg/dL — AB
Specific Gravity, Urine: 1.009 (ref 1.005–1.030)
pH: 6 (ref 5.0–8.0)

## 2020-02-01 LAB — CBC
HCT: 33.3 % — ABNORMAL LOW (ref 36.0–46.0)
Hemoglobin: 10.3 g/dL — ABNORMAL LOW (ref 12.0–15.0)
MCH: 26.4 pg (ref 26.0–34.0)
MCHC: 30.9 g/dL (ref 30.0–36.0)
MCV: 85.4 fL (ref 80.0–100.0)
Platelets: 132 10*3/uL — ABNORMAL LOW (ref 150–400)
RBC: 3.9 MIL/uL (ref 3.87–5.11)
RDW: 18.6 % — ABNORMAL HIGH (ref 11.5–15.5)
WBC: 4 10*3/uL (ref 4.0–10.5)
nRBC: 0 % (ref 0.0–0.2)

## 2020-02-01 LAB — GLUCOSE, CAPILLARY
Glucose-Capillary: 109 mg/dL — ABNORMAL HIGH (ref 70–99)
Glucose-Capillary: 141 mg/dL — ABNORMAL HIGH (ref 70–99)

## 2020-02-01 LAB — TROPONIN I (HIGH SENSITIVITY): Troponin I (High Sensitivity): 85 ng/L — ABNORMAL HIGH (ref <18)

## 2020-02-01 LAB — BRAIN NATRIURETIC PEPTIDE: B Natriuretic Peptide: 295.3 pg/mL — ABNORMAL HIGH (ref 0.0–100.0)

## 2020-02-01 LAB — SARS CORONAVIRUS 2 (TAT 6-24 HRS): SARS Coronavirus 2: NEGATIVE

## 2020-02-01 MED ORDER — CLONIDINE HCL 0.3 MG/24HR TD PTWK
0.3000 mg | MEDICATED_PATCH | TRANSDERMAL | Status: DC
  Administered 2020-02-04: 0.3 mg via TRANSDERMAL
  Filled 2020-02-01: qty 1

## 2020-02-01 MED ORDER — INSULIN GLARGINE 100 UNIT/ML ~~LOC~~ SOLN
18.0000 [IU] | Freq: Every day | SUBCUTANEOUS | Status: DC
  Administered 2020-02-01: 18 [IU] via SUBCUTANEOUS
  Filled 2020-02-01 (×2): qty 0.18

## 2020-02-01 MED ORDER — ONDANSETRON HCL 4 MG PO TABS: 4.0000 mg | ORAL_TABLET | Freq: Four times a day (QID) | ORAL | Status: DC | PRN

## 2020-02-01 MED ORDER — ACETAMINOPHEN 325 MG PO TABS: 650.0000 mg | ORAL_TABLET | Freq: Four times a day (QID) | ORAL | Status: DC | PRN

## 2020-02-01 MED ORDER — INSULIN ASPART 100 UNIT/ML ~~LOC~~ SOLN
0.0000 [IU] | Freq: Every day | SUBCUTANEOUS | Status: DC
  Administered 2020-02-02: 2 [IU] via SUBCUTANEOUS

## 2020-02-01 MED ORDER — PROPYLENE GLYCOL 0.6 % OP SOLN: 1.0000 [drp] | Freq: Three times a day (TID) | OPHTHALMIC | Status: DC

## 2020-02-01 MED ORDER — LEVOTHYROXINE SODIUM 112 MCG PO TABS: 336.0000 ug | ORAL_TABLET | ORAL | Status: DC

## 2020-02-01 MED ORDER — ALBUTEROL SULFATE HFA 108 (90 BASE) MCG/ACT IN AERS: 1.0000 | INHALATION_SPRAY | Freq: Four times a day (QID) | RESPIRATORY_TRACT | Status: DC | PRN

## 2020-02-01 MED ORDER — ALLOPURINOL 100 MG PO TABS
100.0000 mg | ORAL_TABLET | Freq: Two times a day (BID) | ORAL | Status: DC
  Administered 2020-02-01 – 2020-02-04 (×6): 100 mg via ORAL
  Filled 2020-02-01 (×6): qty 1

## 2020-02-01 MED ORDER — FUROSEMIDE 10 MG/ML IJ SOLN
80.0000 mg | Freq: Once | INTRAMUSCULAR | Status: AC
  Administered 2020-02-01: 80 mg via INTRAVENOUS
  Filled 2020-02-01: qty 8

## 2020-02-01 MED ORDER — ISOSORBIDE MONONITRATE ER 30 MG PO TB24
30.0000 mg | ORAL_TABLET | Freq: Every day | ORAL | Status: DC
  Administered 2020-02-01 – 2020-02-04 (×4): 30 mg via ORAL
  Filled 2020-02-01 (×4): qty 1

## 2020-02-01 MED ORDER — ENOXAPARIN SODIUM 30 MG/0.3ML ~~LOC~~ SOLN
30.0000 mg | SUBCUTANEOUS | Status: DC
  Administered 2020-02-01: 30 mg via SUBCUTANEOUS
  Filled 2020-02-01: qty 0.3

## 2020-02-01 MED ORDER — ACETAMINOPHEN 650 MG RE SUPP: 650.0000 mg | Freq: Four times a day (QID) | RECTAL | Status: DC | PRN

## 2020-02-01 MED ORDER — CALCITRIOL 0.5 MCG PO CAPS
0.5000 ug | ORAL_CAPSULE | Freq: Every day | ORAL | Status: DC
  Administered 2020-02-01 – 2020-02-03 (×3): 0.5 ug via ORAL
  Filled 2020-02-01 (×4): qty 1

## 2020-02-01 MED ORDER — FUROSEMIDE 10 MG/ML IJ SOLN
80.0000 mg | Freq: Three times a day (TID) | INTRAMUSCULAR | Status: DC
  Administered 2020-02-01 – 2020-02-02 (×2): 80 mg via INTRAVENOUS
  Filled 2020-02-01 (×2): qty 8

## 2020-02-01 MED ORDER — HYDRALAZINE HCL 25 MG PO TABS
25.0000 mg | ORAL_TABLET | Freq: Three times a day (TID) | ORAL | Status: DC
  Administered 2020-02-01 – 2020-02-04 (×8): 25 mg via ORAL
  Filled 2020-02-01 (×8): qty 1

## 2020-02-01 MED ORDER — PNEUMOCOCCAL VAC POLYVALENT 25 MCG/0.5ML IJ INJ
0.5000 mL | INJECTION | INTRAMUSCULAR | Status: DC
  Filled 2020-02-01: qty 0.5

## 2020-02-01 MED ORDER — AMLODIPINE BESYLATE 10 MG PO TABS
10.0000 mg | ORAL_TABLET | Freq: Two times a day (BID) | ORAL | Status: DC
  Administered 2020-02-01 – 2020-02-04 (×6): 10 mg via ORAL
  Filled 2020-02-01 (×6): qty 1

## 2020-02-01 MED ORDER — ZOLPIDEM TARTRATE 5 MG PO TABS
5.0000 mg | ORAL_TABLET | Freq: Every evening | ORAL | Status: DC | PRN
  Administered 2020-02-01 – 2020-02-03 (×2): 5 mg via ORAL
  Filled 2020-02-01 (×2): qty 1

## 2020-02-01 MED ORDER — ONDANSETRON HCL 4 MG/2ML IJ SOLN: 4.0000 mg | Freq: Four times a day (QID) | INTRAMUSCULAR | Status: DC | PRN

## 2020-02-01 MED ORDER — INSULIN ASPART 100 UNIT/ML ~~LOC~~ SOLN
0.0000 [IU] | Freq: Three times a day (TID) | SUBCUTANEOUS | Status: DC
  Administered 2020-02-02 – 2020-02-03 (×2): 2 [IU] via SUBCUTANEOUS

## 2020-02-01 MED ORDER — LEVOTHYROXINE SODIUM 112 MCG PO TABS
224.0000 ug | ORAL_TABLET | ORAL | Status: DC
  Administered 2020-02-01 – 2020-02-03 (×3): 224 ug via ORAL
  Filled 2020-02-01 (×3): qty 2

## 2020-02-01 MED ORDER — POLYVINYL ALCOHOL 1.4 % OP SOLN
1.0000 [drp] | Freq: Three times a day (TID) | OPHTHALMIC | Status: DC
  Administered 2020-02-01 – 2020-02-04 (×8): 1 [drp] via OPHTHALMIC
  Filled 2020-02-01: qty 15

## 2020-02-01 MED ORDER — ROSUVASTATIN CALCIUM 10 MG PO TABS
10.0000 mg | ORAL_TABLET | Freq: Every evening | ORAL | Status: DC
  Administered 2020-02-01 – 2020-02-03 (×3): 10 mg via ORAL
  Filled 2020-02-01 (×3): qty 1

## 2020-02-01 NOTE — ED Notes (Signed)
Attempted report x 1, per receiving nurse, bed placement changed.

## 2020-02-01 NOTE — ED Provider Notes (Signed)
[facility] DEPT Provider Note   CSN: [id number] Arrival date & time: 02/01/20  1109     History Chief Complaint  Patient presents with  . Shortness of Breath    Tiffany [name] is a 69 y.o. female.  HPI 69 year old female presents with shortness of breath.  This has been progressively worsening for about 2 weeks.  Saw cardiology last week and increase her torsemide from 100 mg 1 day and 150 mg the next to 150 mg each day.  Unfortunately she still seems to be getting worse including worsening leg swelling.  No cough, fever, chest pain.  She is having orthopnea.   Past Medical History:  Diagnosis Date  . ALLERGIC RHINITIS   . Arthritis   . Chronic diastolic heart failure ([place]) 05/05/2013   Echo 03/2018:  EF 10-17, +diastolic dysfunction, GLS -20.5%, normal RVSF, RVSP 42.5 (mild elevated), severe LAE, small pericardial eff, mild MAc, mod AV calc, mild PI  . CKD (chronic kidney disease)   . Diabetes mellitus   . Heart murmur    at birth  . Hyperlipidemia   . Hypertension   . Hypothyroid   . Iron deficiency anemia   . Obesity   . OSA on CPAP   . Previous back surgery    x 2  . Refusal of blood transfusions as patient is Jehovah's Witness   . S/P arthroscopy     Patient Active Problem List   Diagnosis Date Noted  . Acute CHF (congestive heart failure) ([place]) 02/01/2020  . S/P left TKA 07/01/2019  . Status post total left knee replacement 07/01/2019  . Hyperlipidemia 09/12/2016  . Type 2 diabetes mellitus without complications ([place]) 51/03/5850  . Tachycardia 08/26/2016  . Palpitation 08/26/2016  . SOB (shortness of breath) 08/26/2016  . CKD (chronic kidney disease), stage III ([place]) 04/28/2014  . Chronic diastolic heart failure ([place]) 05/05/2013  . Essential hypertension 05/05/2013  . HEART MURMUR, SYSTOLIC 77/82/4235  . UNSPECIFIED HYPOTHYROIDISM 02/12/2007  . OBESITY 01/08/2007  . Obstructive sleep apnea 01/08/2007  . Seasonal and  perennial allergic rhinitis 01/08/2007    Past Surgical History:  Procedure Laterality Date  . BREAST BIOPSY  20+ yrs ago   dont know which breast per pt; benign  . BREAST REDUCTION SURGERY  1982  . KNEE SURGERY     x 2  . LUMBAR DISC SURGERY     x 2  . REDUCTION MAMMAPLASTY Bilateral 1985  . TOE SURGERY    . TOTAL ABDOMINAL HYSTERECTOMY  1997   partial hysterectomy- still has ovaries  . TOTAL KNEE ARTHROPLASTY Left 07/01/2019   Procedure: TOTAL KNEE ARTHROPLASTY;  Surgeon: Paralee Cancel, MD;  Location: [facility];  Service: Orthopedics;  Laterality: Left;  70 min NO BLOOD PRODUCTS!!  . TREATMENT FISTULA ANAL       OB History   No obstetric history on file.     Family History  Problem Relation Age of Onset  . Prostate cancer Father   . Congestive Heart Failure Mother   . Diabetes Sister   . Other Sister        septis  . Breast cancer Neg Hx     Social History   Tobacco Use  . Smoking status: Never Smoker  . Smokeless tobacco: Never Used  Vaping Use  . Vaping Use: Never used  Substance Use Topics  . Alcohol use: Yes    Alcohol/week: 0.0 standard drinks    Comment: rare  . Drug use: No  Home Medications Prior to Admission medications   Medication Sig Start Date End Date Taking? Authorizing Provider  acetaminophen (TYLENOL) 650 MG CR tablet Take 650 mg by mouth every 8 (eight) hours as needed for pain.    [provider]  albuterol (VENTOLIN HFA) 108 (90 Base) MCG/ACT inhaler Inhale 1-2 puffs into the lungs every 6 (six) hours as needed for wheezing or shortness of breath.    [provider]  allopurinol (ZYLOPRIM) 100 MG tablet Take 100 mg by mouth 2 (two) times daily. 07/30/16   [provider]  amLODipine (NORVASC) 10 MG tablet Take 10 mg by mouth 2 (two) times daily.     [provider]  B Complex Vitamins (B COMPLEX PO) Take 1 tablet by mouth daily.    [provider]  B-D INS SYRINGE 0.5CC/30GX1/2" 30G X 1/2" 0.5  ML MISC TAKE AS DIRECTED TWICE A DAY 08/12/15   [provider]  BIOTIN 5000 PO Take 5,000 mcg by mouth daily.     [provider]  calcitRIOL (ROCALTROL) 0.25 MCG capsule Take 0.25 mcg by mouth daily at 6 PM. (1700)    [provider]  carvedilol (COREG) 6.25 MG tablet Take 6.25 mg by mouth in the morning and at bedtime.    [provider]  cetirizine (ZYRTEC) 10 MG tablet Take 10 mg by mouth daily as needed for allergies.    [provider]  cloNIDine (CATAPRES - DOSED IN MG/24 HR) 0.3 mg/24hr Place 0.3 mg onto the skin every Friday.     [provider]  co-enzyme Q-10 30 MG capsule Take 100 mg by mouth daily.    [provider]  hydrALAZINE (APRESOLINE) 25 MG tablet Take 25 mg by mouth 3 (three) times daily.    [provider]  insulin glargine (LANTUS) 100 UNIT/ML injection Inject 18 Units into the skin at bedtime.     [provider]  isosorbide mononitrate (IMDUR) 30 MG 24 hr tablet Take 1 tablet (30 mg total) by mouth daily. 01/25/20 04/24/20  Burtis Junes, NP  levothyroxine (SYNTHROID) 112 MCG tablet Take 224-336 mcg by mouth See admin instructions. Take 3 tablets (336 mcg) by mouth on Sundays at 2200 & take 2 tablets (224 mcg) by mouth on Mondays, Tuesdays, Wednesdays, Thursdays, Fridays, & Saturdays at 2200.    [provider]  Multiple Vitamins-Minerals (PRESERVISION AREDS PO) Take 1 tablet by mouth in the morning and at bedtime.    [provider]  NON FORMULARY Take 1 tablet by mouth daily. Renadyl for kidneys    [provider]  Omega-3 Fatty Acids (FISH OIL) 500 MG CAPS Take 500 mg by mouth daily in the afternoon.    [provider]  polyethylene glycol (MIRALAX / GLYCOLAX) 17 g packet Take 17 g by mouth 2 (two) times daily. 07/02/19   Maurice March, PA-C  rosuvastatin (CRESTOR) 10 MG tablet Take 10 mg by mouth every evening.    [provider]  SYSTANE  COMPLETE 0.6 % SOLN Place 1 drop into both eyes in the morning, at noon, in the evening, and at bedtime.    [provider]  torsemide (DEMADEX) 100 MG tablet Take 100-150 mg by mouth See admin instructions. Alternating between 1 tablet (100 mg) & 1.5 tablets (150 mg) every other day    [provider]  TURMERIC PO Take 1 tablet by mouth daily.    [provider]  zolpidem (AMBIEN) 5 MG tablet  1 at bedtime as needed for sleep 10/26/19   Deneise Lever, MD    Allergies    Patient has no known allergies.  Review of Systems   Review of Systems  Constitutional: Negative for fever.  Respiratory: Positive for shortness of breath. Negative for cough.   Cardiovascular: Positive for leg swelling. Negative for chest pain.  All other systems reviewed and are negative.   Physical Exam Updated Vital Signs BP (!) 195/84   Pulse (!) 57   Temp 97.7 F (36.5 C) (Oral)   Resp 19   Ht _0  (1.778 m)   Wt 122.5 kg   SpO2 98%   BMI 38.74 kg/m   Physical Exam Vitals and nursing note reviewed.  Constitutional:      General: She is not in acute distress.    Appearance: She is well-developed and well-nourished. She is obese. She is not ill-appearing or diaphoretic.  HENT:     Head: Normocephalic and atraumatic.     Right Ear: External ear normal.     Left Ear: External ear normal.     Nose: Nose normal.  Eyes:     General:        Right eye: No discharge.        Left eye: No discharge.  Cardiovascular:     Rate and Rhythm: Normal rate and regular rhythm.     Heart sounds: Normal heart sounds.  Pulmonary:     Effort: Pulmonary effort is normal.     Breath sounds: Normal breath sounds.  Abdominal:     Palpations: Abdomen is soft.     Tenderness: There is no abdominal tenderness.  Musculoskeletal:     Right lower leg: Edema present.     Left lower leg: Edema present.  Skin:    General: Skin is warm and dry.  Neurological:     Mental Status: She is alert.   Psychiatric:        Mood and Affect: Mood is not anxious.     ED Results / Procedures / Treatments   Labs (all labs ordered are listed, but only abnormal results are displayed) Labs Reviewed  BASIC METABOLIC PANEL - Abnormal; Notable for the following components:      Result Value   Glucose, Bld 119 (*)    BUN 121 (*)    Creatinine, Ser 3.68 (*)    GFR, Estimated 13 (*)    All other components within normal limits  CBC - Abnormal; Notable for the following components:   Hemoglobin 10.3 (*)    HCT 33.3 (*)    RDW 18.6 (*)    Platelets 132 (*)    All other components within normal limits  BRAIN NATRIURETIC PEPTIDE - Abnormal; Notable for the following components:   B Natriuretic Peptide 295.3 (*)    All other components within normal limits  SARS CORONAVIRUS 2 (TAT 6-24 HRS)    EKG None  Radiology DG Chest 2 View  Result Date: 02/01/2020 CLINICAL DATA:  Dyspnea. EXAM: CHEST - 2 VIEW COMPARISON:  September 24, 2019. FINDINGS: Stable cardiomegaly. Mild central pulmonary vascular congestion is noted. Mild bibasilar interstitial densities are noted which may represent edema or possibly inflammation. No pneumothorax or pleural effusion is noted. Bony thorax is unremarkable. IMPRESSION: Mild central pulmonary vascular congestion. Mild bibasilar interstitial densities are noted which may represent edema or possibly inflammation. Electronically Signed   By: Marijo Conception M.D.   On: 02/01/2020 11:47    Procedures Procedures (  including critical care time)  Medications Ordered in ED Medications  furosemide (LASIX) injection 80 mg (80 mg Intravenous Given 02/01/20 1423)    ED Course  I have reviewed the triage vital signs and the nursing notes.  Pertinent labs & imaging results that were available during my care of the patient were reviewed by me and considered in my medical decision making (see chart for details).    MDM Rules/Calculators/A&P                           Patient is not in distress but does have a somewhat worsening kidney function with impressive BUN as well as CHF.  Will give IV Lasix.  However at this point I do not think he can be managed as an outpatient she will need admission.  Discussed with Dr. Marylyn Ishihara for admission.  He has for nephrology consult and I have consulted Dr. Jonnie Finner. Final Clinical Impression(s) / ED Diagnoses Final diagnoses:  Acute diastolic congestive heart failure [facility])    Rx / DC Orders ED Discharge Orders    None       Sherwood Gambler, MD 02/01/20 1515

## 2020-02-01 NOTE — ED Triage Notes (Addendum)
Patient reports that she has had SOB for longer than a month, but worse in the past 2 weeks. SOB worse with exertion-walking etc.  Patient states she was prescribed a higher dose of Torsemide and Isosorbide was started last week.

## 2020-02-01 NOTE — H&P (Signed)
History and Physical    Tiffany [name] LYY:[id number] DOB: 05-Dec-1950 DOA: 02/01/2020  PCP: Nolene Ebbs, MD  Patient coming from: Home  Chief Complaint: dyspnea.  HPI: Tiffany [name] is a 69 y.o. female with medical history significant of HFpEF, CKD IV, HTN, HLD. Presenting with dyspnea that has been worsening over the last 2 weeks. She reports that she has been compliant on her regular HF meds, but they have not been helping. She saw her PCP who told her to increase her torsemide. She follow up for a stress test which was ok and was supposed to go next week for an echo. Her symptoms have continued to worsen in the meantime. She states that this morning she tried to get up to take her car to be fixed; however, walking to the door wore her out. She became concerned and came to the ED.    ED Course: She was found to have an elevated BNP. Her renal function was worsened. She was given lasix. Nephrology was consulted. [facility] was called for admission.   Review of Systems:  She denies CP, palpitations, N, V, F. Reports leg swelling, dyspnea, orthopnea. Review of systems is otherwise negative for all not mentioned in HPI.   PMHx Past Medical History:  Diagnosis Date  . ALLERGIC RHINITIS   . Arthritis   . Chronic diastolic heart failure ([place]) 05/05/2013   Echo 03/2018:  EF 12-75, +diastolic dysfunction, GLS -20.5%, normal RVSF, RVSP 42.5 (mild elevated), severe LAE, small pericardial eff, mild MAc, mod AV calc, mild PI  . CKD (chronic kidney disease)   . Diabetes mellitus   . Heart murmur    at birth  . Hyperlipidemia   . Hypertension   . Hypothyroid   . Iron deficiency anemia   . Obesity   . OSA on CPAP   . Previous back surgery    x 2  . Refusal of blood transfusions as patient is Jehovah's Witness   . S/P arthroscopy     PSHx Past Surgical History:  Procedure Laterality Date  . BREAST BIOPSY  20+ yrs ago   dont know which breast per pt; benign  . BREAST REDUCTION SURGERY   1982  . KNEE SURGERY     x 2  . LUMBAR DISC SURGERY     x 2  . REDUCTION MAMMAPLASTY Bilateral 1985  . TOE SURGERY    . TOTAL ABDOMINAL HYSTERECTOMY  1997   partial hysterectomy- still has ovaries  . TOTAL KNEE ARTHROPLASTY Left 07/01/2019   Procedure: TOTAL KNEE ARTHROPLASTY;  Surgeon: Paralee Cancel, MD;  Location: [facility];  Service: Orthopedics;  Laterality: Left;  70 min NO BLOOD PRODUCTS!!  . TREATMENT FISTULA ANAL      SocHx  reports that she has never smoked. She has never used smokeless tobacco. She reports current alcohol use. She reports that she does not use drugs.  No Known Allergies  FamHx Family History  Problem Relation Age of Onset  . Prostate cancer Father   . Congestive Heart Failure Mother   . Diabetes Sister   . Other Sister        septis  . Breast cancer Neg Hx     Prior to Admission medications   Medication Sig Start Date End Date Taking? Authorizing Provider  acetaminophen (TYLENOL) 650 MG CR tablet Take 650 mg by mouth every 8 (eight) hours as needed for pain.    [provider]  albuterol (VENTOLIN HFA) 108 (90 Base) MCG/ACT inhaler  Inhale 1-2 puffs into the lungs every 6 (six) hours as needed for wheezing or shortness of breath.    [provider]  allopurinol (ZYLOPRIM) 100 MG tablet Take 100 mg by mouth 2 (two) times daily. 07/30/16   [provider]  amLODipine (NORVASC) 10 MG tablet Take 10 mg by mouth 2 (two) times daily.     [provider]  B Complex Vitamins (B COMPLEX PO) Take 1 tablet by mouth daily.    [provider]  B-D INS SYRINGE 0.5CC/30GX1/2" 30G X 1/2" 0.5 ML MISC TAKE AS DIRECTED TWICE A DAY 08/12/15   [provider]  BIOTIN 5000 PO Take 5,000 mcg by mouth daily.     [provider]  calcitRIOL (ROCALTROL) 0.25 MCG capsule Take 0.25 mcg by mouth daily at 6 PM. (1700)    [provider]  carvedilol (COREG) 6.25 MG tablet Take 6.25 mg by mouth in the morning and at  bedtime.    [provider]  cetirizine (ZYRTEC) 10 MG tablet Take 10 mg by mouth daily as needed for allergies.    [provider]  cloNIDine (CATAPRES - DOSED IN MG/24 HR) 0.3 mg/24hr Place 0.3 mg onto the skin every Friday.     [provider]  co-enzyme Q-10 30 MG capsule Take 100 mg by mouth daily.    [provider]  hydrALAZINE (APRESOLINE) 25 MG tablet Take 25 mg by mouth 3 (three) times daily.    [provider]  insulin glargine (LANTUS) 100 UNIT/ML injection Inject 18 Units into the skin at bedtime.     [provider]  isosorbide mononitrate (IMDUR) 30 MG 24 hr tablet Take 1 tablet (30 mg total) by mouth daily. 01/25/20 04/24/20  Burtis Junes, NP  levothyroxine (SYNTHROID) 112 MCG tablet Take 224-336 mcg by mouth See admin instructions. Take 3 tablets (336 mcg) by mouth on Sundays at 2200 & take 2 tablets (224 mcg) by mouth on Mondays, Tuesdays, Wednesdays, Thursdays, Fridays, & Saturdays at 2200.    [provider]  Multiple Vitamins-Minerals (PRESERVISION AREDS PO) Take 1 tablet by mouth in the morning and at bedtime.    [provider]  NON FORMULARY Take 1 tablet by mouth daily. Renadyl for kidneys    [provider]  Omega-3 Fatty Acids (FISH OIL) 500 MG CAPS Take 500 mg by mouth daily in the afternoon.    [provider]  polyethylene glycol (MIRALAX / GLYCOLAX) 17 g packet Take 17 g by mouth 2 (two) times daily. 07/02/19   Maurice March, PA-C  rosuvastatin (CRESTOR) 10 MG tablet Take 10 mg by mouth every evening.    [provider]  SYSTANE COMPLETE 0.6 % SOLN Place 1 drop into both eyes in the morning, at noon, in the evening, and at bedtime.    [provider]  torsemide (DEMADEX) 100 MG tablet Take 100-150 mg by mouth See admin instructions. Alternating between 1 tablet (100 mg) & 1.5 tablets (150 mg) every other day    [provider]  TURMERIC PO Take 1  tablet by mouth daily.    [provider]  zolpidem (AMBIEN) 5 MG tablet 1 at bedtime as needed for sleep 10/26/19   Baird Lyons D, MD    Physical Exam: Vitals:   02/01/20 1345 02/01/20 1400 02/01/20 1415 02/01/20 1430  BP: (!) 155/72 (!) 174/81 (!) 133/104 (!) 195/84  Pulse: (!) 53 (!) 58 (!) 58 (!) 57  Resp:  _0 Temp:      TempSrc:      SpO2: 100% 100% 97% 98%  Weight:      Height:        General: 69 y.o. female resting in bed in NAD Eyes: PERRL, normal sclera ENMT: Nares patent w/o discharge, orophaynx clear, dentition normal, ears w/o discharge/lesions/ulcers Neck: Supple, trachea midline Cardiovascular: brady, +S1, S2, no m/g/r, equal pulses throughout Respiratory: Decreased at bases w/ soft rhonchi, no w/r, increased WOB on RA GI: BS+, NDNT, no masses noted, no organomegaly noted MSK: No c/c, BLE edema Skin: No rashes, bruises, ulcerations noted Neuro: A&O x 3, no focal deficits Psyc: Appropriate interaction and affect, calm/cooperative  Labs on Admission: I have personally reviewed following labs and imaging studies  CBC: Recent Labs  Lab 02/01/20 1231  WBC 4.0  HGB 10.3*  HCT 33.3*  MCV 85.4  PLT 403*   Basic Metabolic Panel: Recent Labs  Lab 02/01/20 1231  NA 140  K 4.3  CL 104  CO2 23  GLUCOSE 119*  BUN 121*  CREATININE 3.68*  CALCIUM 9.6   GFR: Estimated Creatinine Clearance: 20.8 mL/min (A) (by C-G formula based on SCr of 3.68 mg/dL (H)). Liver Function Tests: No results for input(s): AST, ALT, ALKPHOS, BILITOT, PROT, ALBUMIN in the last 168 hours. No results for input(s): LIPASE, AMYLASE in the last 168 hours. No results for input(s): AMMONIA in the last 168 hours. Coagulation Profile: No results for input(s): INR, PROTIME in the last 168 hours. Cardiac Enzymes: No results for input(s): CKTOTAL, CKMB, CKMBINDEX, TROPONINI in the last 168 hours. BNP (last 3 results) Recent Labs    01/25/20 1506  PROBNP 2,507*    HbA1C: No results for input(s): HGBA1C in the last 72 hours. CBG: No results for input(s): GLUCAP in the last 168 hours. Lipid Profile: No results for input(s): CHOL, HDL, LDLCALC, TRIG, CHOLHDL, LDLDIRECT in the last 72 hours. Thyroid Function Tests: No results for input(s): TSH, T4TOTAL, FREET4, T3FREE, THYROIDAB in the last 72 hours. Anemia Panel: No results for input(s): VITAMINB12, FOLATE, FERRITIN, TIBC, IRON, RETICCTPCT in the last 72 hours. Urine analysis:    Component Value Date/Time   COLORURINE YELLOW 01/20/2011 0640   APPEARANCEUR CLEAR 01/20/2011 0640   LABSPEC 1.025 02/25/2019 1939   PHURINE 7.0 02/25/2019 1939   GLUCOSEU NEGATIVE 02/25/2019 1939   HGBUR TRACE (A) 02/25/2019 [place] NEGATIVE 02/25/2019 [place] NEGATIVE 02/25/2019 1939   PROTEINUR >=300 (A) 02/25/2019 1939   UROBILINOGEN 0.2 02/25/2019 1939   NITRITE NEGATIVE 02/25/2019 1939   LEUKOCYTESUR TRACE (A) 02/25/2019 1939    Radiological Exams on Admission: DG Chest 2 View  Result Date: 02/01/2020 CLINICAL DATA:  Dyspnea. EXAM: CHEST - 2 VIEW COMPARISON:  September 24, 2019. FINDINGS: Stable cardiomegaly. Mild central pulmonary vascular congestion is noted. Mild bibasilar interstitial densities are noted which may represent edema or possibly inflammation. No pneumothorax or pleural effusion is noted. Bony thorax is unremarkable. IMPRESSION: Mild central pulmonary vascular congestion. Mild bibasilar interstitial densities are noted which may represent edema or possibly inflammation. Electronically Signed   By: Marijo Conception M.D.   On: 02/01/2020 11:47    Assessment/Plan Acute on chronic HFpEF HTN     - admit to inpt, tele     - nephrology onboard, appreciate assistance     - lasix 45m TID, I&O, daily wts     - check EKG, trp, echo     -  resuming home BP meds, but holding coreg d/t bradycardia (see below)  AKI on CKD4 Azotemia     - renal US ordered, nephro onboard, appreciate  assistance  Normocytic anemia Thrombocytopenia     - likely from chronic disease     - no evidence of bleed, follow  Hypothyroidism     - continue home synthroid  HLD    - continue home statin  Bradycardia     - hold coreg for now     - check EKG  DM2     - lantus, SSI, DM diet, A1c, glucose checks  DVT prophylaxis: lovenox  Code Status: DNI  Family Communication: None at bedside  Consults called: EDP spoke with nephrology.   Status is: Inpatient  Remains inpatient appropriate because:Inpatient level of care appropriate due to severity of illness   Dispo: The patient is from: Home              Anticipated d/c is to: Home              Anticipated d/c date is: 3 days              Patient currently is not medically stable to d/c.  Jonnie Finner DO [facility]  If 7PM-7AM, please contact night-coverage [email or web address]  02/01/2020, 3:13 PM

## 2020-02-01 NOTE — Consult Note (Signed)
Renal Service Consult Note [facility]  Tiffany [name] 02/01/2020 [name], MD Requesting Physician: Dr. Marylyn Ishihara, T.   Reason for Consult:  HPI: The patient is a 69 y.o. year-old w/ hx of IDDM, OSA on CPAP, obesity, anemia , HTN, HL, CKD IV, chronic diast HF who presented with SOB / DOE for about 1 month, worse the past 2 wks.  Her demadex was increased and Imdur started last week for this.    Pt lives in [place]. She denies any orthopnea, abd pain , n/v/d or chest pain. Main c/o is leg swelling and DOE.  CXR borderline for fluid, +vasc congestion. On RA in ED.      ROS  denies CP  no joint pain   no HA  no blurry vision  no rash  no diarrhea  no nausea/ vomiting  no dysuria  no difficulty voiding  no change in urine color    Past Medical History  Past Medical History:  Diagnosis Date  . ALLERGIC RHINITIS   . Arthritis   . Chronic diastolic heart failure ([place]) 05/05/2013   Echo 03/2018:  EF 85-46, +diastolic dysfunction, GLS -20.5%, normal RVSF, RVSP 42.5 (mild elevated), severe LAE, small pericardial eff, mild MAc, mod AV calc, mild PI  . CKD (chronic kidney disease)   . Diabetes mellitus   . Heart murmur    at birth  . Hyperlipidemia   . Hypertension   . Hypothyroid   . Iron deficiency anemia   . Obesity   . OSA on CPAP   . Previous back surgery    x 2  . Refusal of blood transfusions as patient is Jehov[employer]'s Witness   . S/P arthroscopy    Past Surgical History  Past Surgical History:  Procedure Laterality Date  . BREAST BIOPSY  20+ yrs ago   dont know which breast per pt; benign  . BREAST REDUCTION SURGERY  1982  . KNEE SURGERY     x 2  . LUMBAR DISC SURGERY     x 2  . REDUCTION MAMMAPLASTY Bilateral 1985  . TOE SURGERY    . TOTAL ABDOMINAL HYSTERECTOMY  1997   partial hysterectomy- still has ovaries  . TOTAL KNEE ARTHROPLASTY Left 07/01/2019   Procedure: TOTAL KNEE ARTHROPLASTY;  Surgeon: Paralee Cancel, MD;  Location: [facility];   Service: Orthopedics;  Laterality: Left;  70 min NO BLOOD PRODUCTS!!  . TREATMENT FISTULA ANAL     Family History  Family History  Problem Relation Age of Onset  . Prostate cancer Father   . Congestive Heart Failure Mother   . Diabetes Sister   . Other Sister        septis  . Breast cancer Neg Hx    Social History  reports that she has never smoked. She has never used smokeless tobacco. She reports current alcohol use. She reports that she does not use drugs. Allergies No Known Allergies Home medications Prior to Admission medications   Medication Sig Start Date End Date Taking? Authorizing Provider  acetaminophen (TYLENOL) 650 MG CR tablet Take 650 mg by mouth every 8 (eight) hours as needed for pain.    [provider]  albuterol (VENTOLIN HFA) 108 (90 Base) MCG/ACT inhaler Inhale 1-2 puffs into the lungs every 6 (six) hours as needed for wheezing or shortness of breath.    [provider]  allopurinol (ZYLOPRIM) 100 MG tablet Take 100 mg by mouth 2 (two) times daily. 07/30/16   [provider]  amLODipine (NORVASC) 10 MG tablet Take 10 mg by mouth 2 (two) times daily.     [provider]  B Complex Vitamins (B COMPLEX PO) Take 1 tablet by mouth daily.    [provider]  B-D INS SYRINGE 0.5CC/30GX1/2" 30G X 1/2" 0.5 ML MISC TAKE AS DIRECTED TWICE A DAY 08/12/15   [provider]  BIOTIN 5000 PO Take 5,000 mcg by mouth daily.     [provider]  calcitRIOL (ROCALTROL) 0.25 MCG capsule Take 0.25 mcg by mouth daily at 6 PM. (1700)    [provider]  carvedilol (COREG) 6.25 MG tablet Take 6.25 mg by mouth in the morning and at bedtime.    [provider]  cetirizine (ZYRTEC) 10 MG tablet Take 10 mg by mouth daily as needed for allergies.    [provider]  cloNIDine (CATAPRES - DOSED IN MG/24 HR) 0.3 mg/24hr Place 0.3 mg onto the skin every Friday.     [provider]  co-enzyme Q-10 30 MG  capsule Take 100 mg by mouth daily.    [provider]  hydrALAZINE (APRESOLINE) 25 MG tablet Take 25 mg by mouth 3 (three) times daily.    [provider]  insulin glargine (LANTUS) 100 UNIT/ML injection Inject 18 Units into the skin at bedtime.     [provider]  isosorbide mononitrate (IMDUR) 30 MG 24 hr tablet Take 1 tablet (30 mg total) by mouth daily. 01/25/20 04/24/20  Burtis Junes, NP  levothyroxine (SYNTHROID) 112 MCG tablet Take 224-336 mcg by mouth See admin instructions. Take 3 tablets (336 mcg) by mouth on Sundays at 2200 & take 2 tablets (224 mcg) by mouth on Mondays, Tuesdays, Wednesdays, Thursdays, Fridays, & Saturdays at 2200.    [provider]  Multiple Vitamins-Minerals (PRESERVISION AREDS PO) Take 1 tablet by mouth in the morning and at bedtime.    [provider]  NON FORMULARY Take 1 tablet by mouth daily. Renadyl for kidneys    [provider]  Omega-3 Fatty Acids (FISH OIL) 500 MG CAPS Take 500 mg by mouth daily in the afternoon.    [provider]  polyethylene glycol (MIRALAX / GLYCOLAX) 17 g packet Take 17 g by mouth 2 (two) times daily. 07/02/19   Maurice March, PA-C  rosuvastatin (CRESTOR) 10 MG tablet Take 10 mg by mouth every evening.    [provider]  SYSTANE COMPLETE 0.6 % SOLN Place 1 drop into both eyes in the morning, at noon, in the evening, and at bedtime.    [provider]  torsemide (DEMADEX) 100 MG tablet Take 100-150 mg by mouth See admin instructions. Alternating between 1 tablet (100 mg) & 1.5 tablets (150 mg) every other day    [provider]  TURMERIC PO Take 1 tablet by mouth daily.    [provider]  zolpidem (AMBIEN) 5 MG tablet 1 at bedtime as needed for sleep 10/26/19   Baird Lyons D, MD     Vitals:   02/01/20 1500 02/01/20 1515 02/01/20 1530 02/01/20 1541  BP: (!) 193/86 (!) 178/84  (!) 163/78  Pulse: 63 60 (!) 57 63  Resp: _0 Temp:    97.7 F (36.5 C)  TempSrc:    Oral  SpO2: 99% 100% 98% 97%  Weight:      Height:       Exam Gen alert, on distress, lying at 30Deg No rash,  cyanosis or gangrene Sclera anicteric, throat clear  No jvd or bruits Chest clear bilat to bases no rales or wheeizng RRR +2/6 sem, no RG Abd soft ntnd no mass or ascites +bs GU defer MS no joint effusions or deformity Ext 2-3+ bilat LE edema, no wounds or ulcers Neuro is alert, Ox 3 , nf    Home meds:  - norvasc 10 bid/ coreg 6.25 bid/ clonidine patch 0.3 q wk/ hydralazine 25 tid/ demadex 100 mg alt w/ 150 mg qod  - zyloprim 100 bid/ synthroid qd  - imdur 30/ crestor 10  - lantus insulin 18u hs  - prn's/ vitamins/ supplements    Date   Creat  eGFR   2008- 11  1.5- 1.9    2012- 14  1.70- 2.15 28   2018- 19  2.35- 2.71 20- 24   2020   2.82- 2.93 18- 19   Jan- jun 2021  2.62- 3.22 17- 21   Dec 08, 2019  3.23  15    01/25/20  3.02  18    02/01/20  3.68  13    CXR 12/28 - IMPRESSION: Mild central pulmonary vascular congestion. Mild bibasilar interstitial densities are noted which may represent edema or possibly inflammation.    Na 140  K 4.3  CO2 23  BUN 121  Cr 3.68  eGFR 13  BNP 295   WBC 4K    Hb 10  plt 132    Assessment/ Plan: 1. AKI on CKD IV - b/l creat 2.8- 3.2, eGFR 15- 18, f/b Dr Johnney Ou.  Creat here 3.68. pt presenting w/ SOB/ DOE and leg edema. Admitting for diuresis.  Chest on exam unremarkable and CXR borderline edema. Start w/ IV lasix 80 tid and titrate up if needed. Get daily bmet.  Will follow.  2. IDDM - on insulin 3. HTN - BP's up, cont home meds x 4 , and lower vol w/ lasix 4. HL - per pmd      Kelly Splinter  MD 02/01/2020, 3:50 PM  Recent Labs  Lab 02/01/20 1231  WBC 4.0  HGB 10.3*   Recent Labs  Lab 02/01/20 1231  K 4.3  BUN 121*  CREATININE 3.68*  CALCIUM 9.6

## 2020-02-01 NOTE — Progress Notes (Signed)
Patient arrives to room 1436 at this time via wheelchair from the ED.  Patient independent from wheelchair to bed with steady gait noted.  Patient oriented to callbell use and bed controls with stated understanding

## 2020-02-01 NOTE — ED Notes (Signed)
Patient attached to female external catheter (Purewick)

## 2020-02-02 ENCOUNTER — Inpatient Hospital Stay (HOSPITAL_COMMUNITY): Payer: Medicare Other

## 2020-02-02 ENCOUNTER — Inpatient Hospital Stay (HOSPITAL_COMMUNITY): Admission: RE | Admit: 2020-02-02 | Payer: Medicare Other | Source: Ambulatory Visit

## 2020-02-02 DIAGNOSIS — N184 Chronic kidney disease, stage 4 (severe): Secondary | ICD-10-CM

## 2020-02-02 DIAGNOSIS — I5031 Acute diastolic (congestive) heart failure: Secondary | ICD-10-CM

## 2020-02-02 DIAGNOSIS — N179 Acute kidney failure, unspecified: Secondary | ICD-10-CM

## 2020-02-02 LAB — ECHOCARDIOGRAM COMPLETE
AR max vel: 1.95 cm2
AV Area VTI: 1.92 cm2
AV Area mean vel: 1.78 cm2
AV Mean grad: 19 mmHg
AV Peak grad: 33.9 mmHg
Ao pk vel: 2.91 m/s
Area-P 1/2: 1.56 cm2
Height: 70 in
S' Lateral: 2.6 cm
Weight: 4134.07 oz

## 2020-02-02 LAB — BASIC METABOLIC PANEL
Anion gap: 15 (ref 5–15)
BUN: 120 mg/dL — ABNORMAL HIGH (ref 8–23)
CO2: 22 mmol/L (ref 22–32)
Calcium: 9.7 mg/dL (ref 8.9–10.3)
Chloride: 104 mmol/L (ref 98–111)
Creatinine, Ser: 3.51 mg/dL — ABNORMAL HIGH (ref 0.44–1.00)
GFR, Estimated: 14 mL/min — ABNORMAL LOW (ref >60)
Glucose, Bld: 69 mg/dL — ABNORMAL LOW (ref 70–99)
Potassium: 4.2 mmol/L (ref 3.5–5.1)
Sodium: 141 mmol/L (ref 135–145)

## 2020-02-02 LAB — CBC
HCT: 31.3 % — ABNORMAL LOW (ref 36.0–46.0)
Hemoglobin: 9.7 g/dL — ABNORMAL LOW (ref 12.0–15.0)
MCH: 26.4 pg (ref 26.0–34.0)
MCHC: 31 g/dL (ref 30.0–36.0)
MCV: 85.1 fL (ref 80.0–100.0)
Platelets: 124 10*3/uL — ABNORMAL LOW (ref 150–400)
RBC: 3.68 MIL/uL — ABNORMAL LOW (ref 3.87–5.11)
RDW: 18.5 % — ABNORMAL HIGH (ref 11.5–15.5)
WBC: 4.7 10*3/uL (ref 4.0–10.5)
nRBC: 0 % (ref 0.0–0.2)

## 2020-02-02 LAB — GLUCOSE, CAPILLARY
Glucose-Capillary: 45 mg/dL — ABNORMAL LOW (ref 70–99)
Glucose-Capillary: 80 mg/dL (ref 70–99)
Glucose-Capillary: 68 mg/dL — ABNORMAL LOW (ref 70–99)
Glucose-Capillary: 135 mg/dL — ABNORMAL HIGH (ref 70–99)
Glucose-Capillary: 70 mg/dL (ref 70–99)
Glucose-Capillary: 201 mg/dL — ABNORMAL HIGH (ref 70–99)

## 2020-02-02 LAB — HEMOGLOBIN A1C
Hgb A1c MFr Bld: 5.7 % — ABNORMAL HIGH (ref 4.8–5.6)
Mean Plasma Glucose: 116.89 mg/dL

## 2020-02-02 LAB — TROPONIN I (HIGH SENSITIVITY)
Troponin I (High Sensitivity): 109 ng/L (ref <18)
Troponin I (High Sensitivity): 106 ng/L (ref <18)

## 2020-02-02 LAB — HIV ANTIBODY (ROUTINE TESTING W REFLEX): HIV Screen 4th Generation wRfx: NONREACTIVE

## 2020-02-02 MED ORDER — FUROSEMIDE 10 MG/ML IJ SOLN
80.0000 mg | Freq: Two times a day (BID) | INTRAMUSCULAR | Status: DC
  Administered 2020-02-02 – 2020-02-04 (×4): 80 mg via INTRAVENOUS
  Filled 2020-02-02 (×4): qty 8

## 2020-02-02 MED ORDER — INSULIN GLARGINE 100 UNIT/ML ~~LOC~~ SOLN
15.0000 [IU] | Freq: Every day | SUBCUTANEOUS | Status: DC
  Administered 2020-02-02: 15 [IU] via SUBCUTANEOUS
  Filled 2020-02-02: qty 0.15

## 2020-02-02 NOTE — Progress Notes (Signed)
[facility] Progress Note  Subjective: 800 cc UOP yest recorded , but the patient recorded 5-6 large voids (500- 800 cc) for total of 4600 cc since admission. Leg swelling better. Still SOB w/ walking across the room / to the BR.    Vitals:   02/01/20 2127 02/01/20 2131 02/02/20 0454 02/02/20 0500  BP: (!) 164/60 (!) 164/60 (!) 157/66   Pulse:  64 61   Resp:  (!) 22 17   Temp:  98 F (36.7 C) 98.3 F (36.8 C)   TempSrc:  Oral Oral   SpO2:  99% 98%   Weight:    117.2 kg  Height:        Exam: Gen alert, on distress No jvd or bruits Chest clear bilat to bases RRR +2/6 sem, no RG Abd soft ntnd no mass or ascites +bs Ext improving 1+ LE edema, no hip edema Neuro is alert, Ox 3 , nf    Home meds:  - norvasc 10 bid/ coreg 6.25 bid/ clonidine patch 0.3 q wk/ hydralazine 25 tid/ demadex 100 mg alt w/ 150 mg qod  - zyloprim 100 bid/ synthroid qd  - imdur 30/ crestor 10  - lantus insulin 18u hs  - prn's/ vitamins/ supplements    Date                           Creat               eGFR   2008- 11                    1.5- 1.9               2012- 14                    1.70- 2.15        28   2018- 19                    2.35- 2.71        20- 24   2020                          2.82- 2.93        18- 19   Jan- jun 2021                        2.62- 3.22        17- 21   Dec 08, 2019               3.23                 15           01/25/20                    3.02                 18    02/01/20                   3.68                 13    CXR 12/28 - IMPRESSION: Mild central pulmonary vascular congestion. Mild bibasilar interstitial densities are noted which may represent edema or possibly inflammation.   UA 12/28 - negative, prot  100       Assessment/ Plan: 1. AKI on CKD IV - b/l creat 2.8- 3.2, eGFR 15- 18, f/b Dr Johnney Ou.  Pt states she will not do dialysis even if life-threatening renal failure, "that's not a life".  Creat 3.6 on presentation here w/ SOB/ DOE and  leg edema, poss mild edema by CXR. Admitted for diuresis. With IV lasix UOP was 4600 cc overnight, mostly not recorded though. Leg edema down sig, creat down 3.6 > 3.5 today.  Chest exam remains clear, on RA 98%. Will follow.  2. DOE - still SOB today. Cont diuresing.  3. 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 HTN/ vol - BP's on high side, cont home meds x 4 , lower IV Lasix to 52m q 12hr.  5. HL - per pmd      Rob SCarnot-Moon12/29/2021, 7:04 AM   Recent Labs  Lab 02/01/20 1231 02/02/20 0415  K 4.3  --   BUN 121*  --   CREATININE 3.68*  --   CALCIUM 9.6  --   HGB 10.3* 9.7*   Inpatient medications: . allopurinol  100 mg Oral BID  . amLODipine  10 mg Oral BID  . calcitRIOL  0.5 mcg Oral Q supper  . [START ON 02/04/2020] cloNIDine  0.3 mg Transdermal Q Fri  . enoxaparin (LOVENOX) injection  30 mg Subcutaneous Q24H  . furosemide  80 mg Intravenous Q8H  . hydrALAZINE  25 mg Oral TID  . insulin aspart  0-15 Units Subcutaneous TID WC  . insulin aspart  0-5 Units Subcutaneous QHS  . insulin glargine  18 Units Subcutaneous QHS  . isosorbide mononitrate  30 mg Oral Daily  . levothyroxine  224 mcg Oral Once per day on Mon Tue Wed Thu Fri Sat  . [START ON 02/06/2020] levothyroxine  336 mcg Oral Weekly  . pneumococcal 23 valent vaccine  0.5 mL Intramuscular Tomorrow-1000  . polyvinyl alcohol  1 drop Both Eyes TID  . rosuvastatin  10 mg Oral QPM    acetaminophen **OR** acetaminophen, albuterol, ondansetron **OR** ondansetron (ZOFRAN) IV, zolpidem

## 2020-02-02 NOTE — Progress Notes (Signed)
CRITICAL VALUE ALERT  CRITICAL VALUE: Troponin 109  DATE & TIME NOTIFIED: Notified by lab at 5:32AM 02/02/2020  Provider Notified: Julio Alm   Orders Received/Actions Taken: New Troponin lab draw ordered

## 2020-02-02 NOTE — Progress Notes (Addendum)
[facility]  PROGRESS NOTE  Tiffany [name] ZHG:[id number] DOB: 06-14-1950 DOA: 02/01/2020 PCP: Nolene Ebbs, MD   Brief HPI:   69 year old female with history of HFpEF, CKD stage IV, hypertension, hyperlipidemia presents with shortness of breath worsening over past 2 weeks. She has been taking her regular heart failure meds but they were not helping so her PCP recommended to increase the dose of torsemide. She eventually went stress test which was okay and was supposed to go next week for an echocardiogram. Patient was found to have elevated BNP, renal function had worsened. She was given Lasix. Nephrology was consulted.    Subjective   Patient seen and examined, denies any complaints.   Assessment/Plan:     1. Acute on chronic HFpEF-patient was given Lasix 80 mg 3 times daily, breathing is improved. Patient is currently on Lasix 80 mg IV every 12 hours. 2. Acute kidney injury on CKD stage IV-nephrology consulted, creatinine slightly better today at 3.58. Continue Lasix 80 mg IV every 12 hours.  Renal ultrasound is unremarkable 3. Diabetes mellitus type 2-patient having episodes of hypoglycemia, will cut down the dose of Lantus to 15 units subcu nightly. Continue sliding scale insulin with NovoLog. 4. Hypothyroidism continue Synthroid- 5. Hypertension-continue amlodipine      COVID-19 Labs  No results for input(s): DDIMER, FERRITIN, LDH, CRP in the last 72 hours.  Lab Results  Component Value Date   SARSCOV2NAA NEGATIVE 02/01/2020   [place] NEGATIVE 06/30/2019     Scheduled medications:   . allopurinol  100 mg Oral BID  . amLODipine  10 mg Oral BID  . calcitRIOL  0.5 mcg Oral Q supper  . [START ON 02/04/2020] cloNIDine  0.3 mg Transdermal Q Fri  . furosemide  80 mg Intravenous Q12H  . hydrALAZINE  25 mg Oral TID  . insulin aspart  0-15 Units Subcutaneous TID WC  . insulin aspart  0-5 Units Subcutaneous QHS  . insulin glargine  18 Units Subcutaneous QHS   . isosorbide mononitrate  30 mg Oral Daily  . levothyroxine  224 mcg Oral Once per day on Mon Tue Wed Thu Fri Sat  . [START ON 02/06/2020] levothyroxine  336 mcg Oral Weekly  . pneumococcal 23 valent vaccine  0.5 mL Intramuscular Tomorrow-1000  . polyvinyl alcohol  1 drop Both Eyes TID  . rosuvastatin  10 mg Oral QPM         CBG: Recent Labs  Lab 02/02/20 0523 02/02/20 0548 02/02/20 0721 02/02/20 1132 02/02/20 1646  GLUCAP 45* 80 68* 135* 70    SpO2: 99 %    CBC: Recent Labs  Lab 02/01/20 1231 02/02/20 0415  WBC 4.0 4.7  HGB 10.3* 9.7*  HCT 33.3* 31.3*  MCV 85.4 85.1  PLT 132* 124*    Basic Metabolic Panel: Recent Labs  Lab 02/01/20 1231 02/02/20 0415  NA 140 141  K 4.3 4.2  CL 104 104  CO2 23 22  GLUCOSE 119* 69*  BUN 121* 120*  CREATININE 3.68* 3.51*  CALCIUM 9.6 9.7     Liver Function Tests: No results for input(s): AST, ALT, ALKPHOS, BILITOT, PROT, ALBUMIN in the last 168 hours.   Antibiotics: Anti-infectives (From admission, onward)   None       DVT prophylaxis: Lovenox  Code Status: DNI  Family Communication: No family at bedside   Consultants:    Procedures:      Objective   Vitals:   02/01/20 2131 02/02/20 0454 02/02/20 0500 02/02/20 1330  BP: (!) 164/60 (!) 157/66  (!) 155/71  Pulse: 64 61  61  Resp: (!) 22 17  19   Temp: 98 F (36.7 C) 98.3 F (36.8 C)  97.8 F (36.6 C)  TempSrc: Oral Oral  Oral  SpO2: 99% 98%  99%  Weight:   117.2 kg   Height:        Intake/Output Summary (Last 24 hours) at 02/02/2020 1859 Last data filed at 02/02/2020 1334 Gross per 24 hour  Intake 360 ml  Output 2000 ml  Net -1640 ml    12/27 1901 - 12/29 0700 In: -  Out: 800 [Urine:800]  Filed Weights   02/01/20 1125 02/02/20 0500  Weight: 122.5 kg 117.2 kg    Physical Examination:  General-appears in no acute distress Heart-S1-S2, regular, no murmur auscultated Lungs-clear to auscultation bilaterally, no wheezing or  crackles auscultated Abdomen-soft, nontender, no organomegaly Extremities-trace  edema in the lower extremities Neuro-alert, oriented x3, no focal deficit noted   Status is: Inpatient  Dispo: The patient is from: Home              Anticipated d/c is to: Home              Anticipated d/c date is: 02/04/2020              Patient currently not stable for discharge  Barrier to discharge-ongoing treatment for acute kidney injury            Data Reviewed:   Recent Results (from the past 240 hour(s))  SARS CORONAVIRUS 2 (TAT 6-24 HRS) Nasopharyngeal Nasopharyngeal Swab     Status: None   Collection Time: 02/01/20  2:30 PM   Specimen: Nasopharyngeal Swab  Result Value Ref Range Status   SARS Coronavirus 2 NEGATIVE NEGATIVE Final    Comment: (NOTE) SARS-CoV-2 target nucleic acids are NOT DETECTED.  The SARS-CoV-2 RNA is generally detectable in upper and lower respiratory specimens during the acute phase of infection. Negative results do not preclude SARS-CoV-2 infection, do not rule out co-infections with other pathogens, and should not be used as the sole basis for treatment or other patient management decisions. Negative results must be combined with clinical observations, patient history, and epidemiological information. The expected result is Negative.  Fact Sheet for Patients: SugarRoll.be  Fact Sheet for Healthcare Providers: https://www.woods-mathews.com/  This test is not yet approved or cleared by the [place] FDA and  has been authorized for detection and/or diagnosis of SARS-CoV-2 by FDA under an Emergency Use Authorization (EUA). This EUA will remain  in effect (meaning this test can be used) for the duration of the COVID-19 declaration under Se ction 564(b)(1) of the Act, 21 U.S.C. section 360bbb-3(b)(1), unless the authorization is terminated or revoked sooner.  Performed at [facility], [place]  [address], [place], [place] [address]     No results for input(s): LIPASE, AMYLASE in the last 168 hours. No results for input(s): AMMONIA in the last 168 hours.  Cardiac Enzymes: No results for input(s): CKTOTAL, CKMB, CKMBINDEX, TROPONINI in the last 168 hours. BNP (last 3 results) Recent Labs    02/01/20 1231  BNP 295.3*    ProBNP (last 3 results) Recent Labs    01/25/20 1506  PROBNP 2,507*    Studies:  DG Chest 2 View  Result Date: 02/01/2020 CLINICAL DATA:  Dyspnea. EXAM: CHEST - 2 VIEW COMPARISON:  September 24, 2019. FINDINGS: Stable cardiomegaly. Mild central pulmonary vascular congestion is noted. Mild bibasilar interstitial  densities are noted which may represent edema or possibly inflammation. No pneumothorax or pleural effusion is noted. Bony thorax is unremarkable. IMPRESSION: Mild central pulmonary vascular congestion. Mild bibasilar interstitial densities are noted which may represent edema or possibly inflammation. Electronically Signed   By: Marijo Conception M.D.   On: 02/01/2020 11:47   US RENAL  Result Date: 02/02/2020 CLINICAL DATA:  69 year old female with acute renal failure on stage 4 chronic kidney disease. EXAM: RENAL / URINARY TRACT ULTRASOUND COMPLETE COMPARISON:  Renal ultrasound 12/24/2005. FINDINGS: Right Kidney: Renal measurements: 10.5 x 4.4 x 5.2 cm = volume: 127 mL. Echogenic right renal cortex (image 2). No hydronephrosis. Several small benign appearing renal cysts, up to 18 mm. Trace pararenal space fluid (image 7). Left Kidney: Renal measurements: 10.8 x 5.2 x 5.6 cm = volume: 167 mL. Echogenic left renal cortex (image 27). No hydronephrosis. Fewer than 10 small benign appearing left renal cysts, up to 20 mm. Bladder: Appears normal for degree of bladder distention. Other: None. IMPRESSION: 1. No acute renal finding. 2. Bilateral chronic medical renal disease and a small number of benign renal cysts. Electronically Signed   By: Genevie Ann M.D.   On: 02/02/2020  06:33   ECHOCARDIOGRAM COMPLETE  Result Date: 02/02/2020    ECHOCARDIOGRAM REPORT   Patient Name:   Tiffany [name] Date of Exam: 02/02/2020 Medical Rec #:  [id number]         Height:       70.0 in Accession #:    [id number]        Weight:       258.4 lb Date of Birth:  [birth year]          BSA:          2.327 m Patient Age:    34 years          BP:           157/66 mmHg Patient Gender: F                 HR:           61 bpm. Exam Location:  Inpatient Procedure: 2D Echo, Color Doppler and Cardiac Doppler Indications:    V95.63 Acute diastolic (congestive) heart failure  History:        Patient has prior history of Echocardiogram examinations, most                 recent 03/11/2018. CHF; Risk Factors:Hypertension, Diabetes,                 Dyslipidemia and Sleep Apnea.  Sonographer:    Raquel Sarna Senior RDCS Referring Phys: [id number] [place]  1. Left ventricular ejection fraction, by estimation, is 65 to 70%. The left ventricle has normal function. The left ventricle has no regional wall motion abnormalities. There is severe left ventricular hypertrophy. Left ventricular diastolic parameters  are consistent with Grade II diastolic dysfunction (pseudonormalization). Elevated left atrial pressure.  2. Right ventricular systolic function is normal. The right ventricular size is normal. There is mildly elevated pulmonary artery systolic pressure. The estimated right ventricular systolic pressure is 29.5 mmHg.  3. A small pericardial effusion is present.  4. Left atrial size was severely dilated.  5. The mitral valve is abnormal. Severe mitral annular calcification. Trivial mitral valve regurgitation. Moderate mitral stenosis. MG 8 mmHg at HR 65 bpm, MVA 1.5 cm^2 by continuity equation  6. The aortic valve is tricuspid. There is moderate  thickening of the aortic valve. Aortic valve regurgitation is not visualized. Mild aortic valve stenosis. Vmax 2.9 m/s, MG 36mmHg, AVA 1.9 cm^2, DI 0.6  7. The inferior vena  cava is dilated in size with <50% respiratory variability, suggesting right atrial pressure of 15 mmHg. FINDINGS  Left Ventricle: Left ventricular ejection fraction, by estimation, is 65 to 70%. The left ventricle has normal function. The left ventricle has no regional wall motion abnormalities. The left ventricular internal cavity size was normal in size. There is  severe left ventricular hypertrophy. Left ventricular diastolic parameters are consistent with Grade II diastolic dysfunction (pseudonormalization). Elevated left atrial pressure. Right Ventricle: The right ventricular size is normal. No increase in right ventricular wall thickness. Right ventricular systolic function is normal. There is mildly elevated pulmonary artery systolic pressure. The tricuspid regurgitant velocity is 2.66  m/s, and with an assumed right atrial pressure of 15 mmHg, the estimated right ventricular systolic pressure is 35.4 mmHg. Left Atrium: Left atrial size was severely dilated. Right Atrium: Right atrial size was normal in size. Pericardium: A small pericardial effusion is present. Mitral Valve: The mitral valve is abnormal. Severe mitral annular calcification. Trivial mitral valve regurgitation. Moderate mitral valve stenosis. MV peak gradient, 13.1 mmHg. The mean mitral valve gradient is 6.8 mmHg. Tricuspid Valve: The tricuspid valve is normal in structure. Tricuspid valve regurgitation is trivial. Aortic Valve: The aortic valve is tricuspid. There is moderate thickening of the aortic valve. Aortic valve regurgitation is not visualized. Mild aortic stenosis is present. Aortic valve mean gradient measures 19.0 mmHg. Aortic valve peak gradient measures 33.9 mmHg. Aortic valve area, by VTI measures 1.92 cm. Pulmonic Valve: The pulmonic valve was not well visualized. Pulmonic valve regurgitation is not visualized. Aorta: The aortic root and ascending aorta are structurally normal, with no evidence of dilitation. Venous: The  inferior vena cava is dilated in size with less than 50% respiratory variability, suggesting right atrial pressure of 15 mmHg. IAS/Shunts: The interatrial septum was not well visualized.  LEFT VENTRICLE PLAX 2D LVIDd:         4.50 cm  Diastology LVIDs:         2.60 cm  LV e' medial:    5.87 cm/s LV PW:         1.90 cm  LV E/e' medial:  27.9 LV IVS:        1.40 cm  LV e' lateral:   6.74 cm/s LVOT diam:     2.00 cm  LV E/e' lateral: 24.3 LV SV:         124 LV SV Index:   53 LVOT Area:     3.14 cm  RIGHT VENTRICLE RV S prime:     14.90 cm/s TAPSE (M-mode): 2.8 cm LEFT ATRIUM              Index       RIGHT ATRIUM           Index LA diam:        4.90 cm  2.11 cm/m  RA Area:     19.30 cm LA Vol (A2C):   118.0 ml 50.70 ml/m RA Volume:   50.80 ml  21.83 ml/m LA Vol (A4C):   169.0 ml 72.61 ml/m LA Biplane Vol: 141.0 ml 60.58 ml/m  AORTIC VALVE AV Area (Vmax):    1.95 cm AV Area (Vmean):   1.78 cm AV Area (VTI):     1.92 cm AV Vmax:  291.00 cm/s AV Vmean:          208.000 cm/s AV VTI:            0.648 m AV Peak Grad:      33.9 mmHg AV Mean Grad:      19.0 mmHg LVOT Vmax:         181.00 cm/s LVOT Vmean:        118.000 cm/s LVOT VTI:          0.395 m LVOT/AV VTI ratio: 0.61  AORTA Ao Root diam: 3.30 cm Ao Asc diam:  3.40 cm MITRAL VALVE                TRICUSPID VALVE MV Area (PHT): 1.56 cm     TR Peak grad:   28.3 mmHg MV Peak grad:  13.1 mmHg    TR Vmax:        266.00 cm/s MV Mean grad:  6.8 mmHg MV Vmax:       1.81 m/s     SHUNTS MV Vmean:      124.5 cm/s   Systemic VTI:  0.40 m MV Decel Time: 486 msec     Systemic Diam: 2.00 cm MV E velocity: 164.00 cm/s MV A velocity: 169.00 cm/s MV E/A ratio:  0.97 Oswaldo Milian MD Electronically signed by Oswaldo Milian MD Signature Date/Time: 02/02/2020/2:56:53 PM    Final        Oswald Hillock   [facility] If 7PM-7AM, please contact night-coverage at [email or web address], Office  [phone]   02/02/2020, 6:59 PM  LOS: 1 day

## 2020-02-02 NOTE — Progress Notes (Signed)
Patient states her intake over night consisted of 218mL of water and 120 mL of juice

## 2020-02-02 NOTE — Progress Notes (Signed)
Echocardiogram 2D Echocardiogram has been performed.  Tiffany [name] 02/02/2020, 11:49 AM

## 2020-02-03 DIAGNOSIS — N179 Acute kidney failure, unspecified: Secondary | ICD-10-CM | POA: Diagnosis not present

## 2020-02-03 DIAGNOSIS — N184 Chronic kidney disease, stage 4 (severe): Secondary | ICD-10-CM | POA: Diagnosis not present

## 2020-02-03 DIAGNOSIS — I5031 Acute diastolic (congestive) heart failure: Secondary | ICD-10-CM | POA: Diagnosis not present

## 2020-02-03 LAB — GLUCOSE, CAPILLARY
Glucose-Capillary: 70 mg/dL (ref 70–99)
Glucose-Capillary: 142 mg/dL — ABNORMAL HIGH (ref 70–99)
Glucose-Capillary: 129 mg/dL — ABNORMAL HIGH (ref 70–99)
Glucose-Capillary: 177 mg/dL — ABNORMAL HIGH (ref 70–99)
Glucose-Capillary: 148 mg/dL — ABNORMAL HIGH (ref 70–99)

## 2020-02-03 LAB — BASIC METABOLIC PANEL
Anion gap: 13 (ref 5–15)
BUN: 115 mg/dL — ABNORMAL HIGH (ref 8–23)
CO2: 24 mmol/L (ref 22–32)
Calcium: 9.6 mg/dL (ref 8.9–10.3)
Chloride: 103 mmol/L (ref 98–111)
Creatinine, Ser: 3.41 mg/dL — ABNORMAL HIGH (ref 0.44–1.00)
GFR, Estimated: 14 mL/min — ABNORMAL LOW (ref >60)
Glucose, Bld: 64 mg/dL — ABNORMAL LOW (ref 70–99)
Potassium: 4 mmol/L (ref 3.5–5.1)
Sodium: 140 mmol/L (ref 135–145)

## 2020-02-03 MED ORDER — INSULIN ASPART 100 UNIT/ML ~~LOC~~ SOLN
0.0000 [IU] | Freq: Three times a day (TID) | SUBCUTANEOUS | Status: DC
  Administered 2020-02-03: 2 [IU] via SUBCUTANEOUS
  Administered 2020-02-04: 1 [IU] via SUBCUTANEOUS

## 2020-02-03 MED ORDER — LORATADINE 10 MG PO TABS
10.0000 mg | ORAL_TABLET | Freq: Every day | ORAL | Status: DC
  Administered 2020-02-03: 10 mg via ORAL
  Filled 2020-02-03 (×2): qty 1

## 2020-02-03 MED ORDER — INSULIN GLARGINE 100 UNIT/ML ~~LOC~~ SOLN
12.0000 [IU] | Freq: Every day | SUBCUTANEOUS | Status: DC
  Administered 2020-02-03: 12 [IU] via SUBCUTANEOUS
  Filled 2020-02-03: qty 0.12

## 2020-02-03 NOTE — Progress Notes (Signed)
Tiffany [name] Progress Note  Subjective: 2300 cc UOP yest, leg swelling continues to improve, pt got up and walked, DOE/ SOB "much better" today  Vitals:   02/02/20 2015 02/02/20 2059 02/03/20 0400 02/03/20 0552  BP: (!) 165/65   (!) 152/62  Pulse: 71 72  64  Resp: _0 Temp: 98.3 F (36.8 C)   98.7 F (37.1 C)  TempSrc: Oral   Oral  SpO2: 99% 99%  100%  Weight:   116.4 kg   Height:        Exam: Gen alert, on distress No jvd or bruits Chest clear bilat to bases RRR +2/6 sem, no RG Abd soft ntnd no mass or ascites +bs Ext improving bilat LE edema, no hip edema Neuro is alert, Ox 3 , nf    Home meds:  - norvasc 10 bid/ coreg 6.25 bid/ clonidine patch 0.3 q wk/ hydralazine 25 tid/ demadex 100 mg alt w/ 150 mg qod  - zyloprim 100 bid/ synthroid qd  - imdur 30/ crestor 10  - lantus insulin 18u hs  - prn's/ vitamins/ supplements    Date                           Creat               eGFR   2008- 11                    1.5- 1.9               2012- 14                    1.70- 2.15        28   2018- 19                    2.35- 2.71        20- 24   2020                          2.82- 2.93        18- 19   Jan- jun 2021                        2.62- 3.22        17- 21   Dec 08, 2019               3.23                 15           01/25/20                    3.02                 18    02/01/20                   3.68                 13    CXR 12/28 - IMPRESSION: Mild central pulmonary vascular congestion. Mild bibasilar interstitial densities are noted which may represent edema or possibly inflammation.   UA 12/28 - negative, prot 100       Assessment/ Plan: 1. AKI on CKD IV - b/l creat 2.8- 3.2, eGFR 15- 18,  f/b Dr Johnney Ou.  Pt states she will not do dialysis even if life-threatening renal failure stating, "that's not a life".  Creat 3.6 on presentation here w/ SOB/ DOE and leg edema, poss mild edema by CXR. Admitted for diuresis and has responded well,  down 6 kg, leg edema better and SOB resolved as of today. Creat has remained stable in mid 3's. Will plan 1 more day of IV lasix then dc home on high dose lasix / demadex hopefully tomorrow.  2. 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 HTN/ vol - BP's on high side, cont home meds x 4 5. HL - per pmd      Rob Tiffany Shore 02/03/2020, 10:25 AM   Recent Labs  Lab 02/01/20 1231 02/02/20 0415 02/03/20 0429  K 4.3 4.2 4.0  BUN 121* 120* 115*  CREATININE 3.68* 3.51* 3.41*  CALCIUM 9.6 9.7 9.6  HGB 10.3* 9.7*  --    Inpatient medications: . allopurinol  100 mg Oral BID  . amLODipine  10 mg Oral BID  . calcitRIOL  0.5 mcg Oral Q supper  . [START ON 02/04/2020] cloNIDine  0.3 mg Transdermal Q Fri  . furosemide  80 mg Intravenous Q12H  . hydrALAZINE  25 mg Oral TID  . insulin aspart  0-15 Units Subcutaneous TID WC  . insulin aspart  0-5 Units Subcutaneous QHS  . insulin glargine  15 Units Subcutaneous QHS  . isosorbide mononitrate  30 mg Oral Daily  . levothyroxine  224 mcg Oral Once per day on Mon Tue Wed Thu Fri Sat  . [START ON 02/06/2020] levothyroxine  336 mcg Oral Weekly  . pneumococcal 23 valent vaccine  0.5 mL Intramuscular Tomorrow-1000  . polyvinyl alcohol  1 drop Both Eyes TID  . rosuvastatin  10 mg Oral QPM    acetaminophen **OR** acetaminophen, albuterol, ondansetron **OR** ondansetron (ZOFRAN) IV, zolpidem

## 2020-02-03 NOTE — Plan of Care (Signed)
  Problem: Education: Goal: Ability to demonstrate management of disease process will improve Outcome: Progressing Goal: Ability to verbalize understanding of medication therapies will improve Outcome: Progressing   Problem: Cardiac: Goal: Ability to achieve and maintain adequate cardiopulmonary perfusion will improve Outcome: Progressing   Problem: Education: Goal: Knowledge of General Education information will improve Description: Including pain rating scale, medication(s)/side effects and non-pharmacologic comfort measures Outcome: Progressing   Problem: Clinical Measurements: Goal: Respiratory complications will improve Outcome: Progressing   Problem: Nutrition: Goal: Adequate nutrition will be maintained Outcome: Progressing

## 2020-02-03 NOTE — Progress Notes (Signed)
[facility]  PROGRESS NOTE  Tiffany [name] YKZ:[id number] DOB: [birth year] DOA: 02/01/2020 PCP: Nolene Ebbs, MD   Brief HPI:   69 year old female with history of HFpEF, CKD stage IV, hypertension, hyperlipidemia presents with shortness of breath worsening over past 2 weeks. She has been taking her regular heart failure meds but they were not helping so her PCP recommended to increase the dose of torsemide. She eventually went stress test which was okay and was supposed to go next week for an echocardiogram. Patient was found to have elevated BNP, renal function had worsened. She was given Lasix. Nephrology was consulted.    Subjective   Patient seen and examined, denies chest pain or shortness of breath.   Assessment/Plan:     1. Acute on chronic HFpEF-patient was given Lasix 80 mg 3 times daily, breathing is improved. Patient is currently on Lasix 80 mg IV every 12 hours. 2. Acute kidney injury on CKD stage IV-nephrology consulted, creatinine slightly better today at  3.41 . Continue Lasix 80 mg IV every 12 hours.  Renal ultrasound is unremarkable 3. Diabetes mellitus type 2-patient having episodes of hypoglycemia, will cut down the dose of Lantus to 12 units subcu nightly.  Will change sliding scale insulin to sensitive sliding scale. 4. Hypothyroidism- continue Synthroid 5. Hypertension-continue amlodipine      COVID-19 Labs  No results for input(s): DDIMER, FERRITIN, LDH, CRP in the last 72 hours.  Lab Results  Component Value Date   SARSCOV2NAA NEGATIVE 02/01/2020   [place] NEGATIVE 06/30/2019     Scheduled medications:   . allopurinol  100 mg Oral BID  . amLODipine  10 mg Oral BID  . calcitRIOL  0.5 mcg Oral Q supper  . [START ON 02/04/2020] cloNIDine  0.3 mg Transdermal Q Fri  . furosemide  80 mg Intravenous Q12H  . hydrALAZINE  25 mg Oral TID  . insulin aspart  0-5 Units Subcutaneous QHS  . insulin aspart  0-9 Units Subcutaneous TID WC  . insulin  glargine  12 Units Subcutaneous QHS  . isosorbide mononitrate  30 mg Oral Daily  . levothyroxine  224 mcg Oral Once per day on Mon Tue Wed Thu Fri Sat  . [START ON 02/06/2020] levothyroxine  336 mcg Oral Weekly  . pneumococcal 23 valent vaccine  0.5 mL Intramuscular Tomorrow-1000  . polyvinyl alcohol  1 drop Both Eyes TID  . rosuvastatin  10 mg Oral QPM         CBG: Recent Labs  Lab 02/02/20 1646 02/02/20 2019 02/03/20 0554 02/03/20 0725 02/03/20 1112  GLUCAP 70 201* 70 142* 129*    SpO2: 99 %    CBC: Recent Labs  Lab 02/01/20 1231 02/02/20 0415  WBC 4.0 4.7  HGB 10.3* 9.7*  HCT 33.3* 31.3*  MCV 85.4 85.1  PLT 132* 124*    Basic Metabolic Panel: Recent Labs  Lab 02/01/20 1231 02/02/20 0415 02/03/20 0429  NA 140 141 140  K 4.3 4.2 4.0  CL 104 104 103  CO2 23 22 24   GLUCOSE 119* 69* 64*  BUN 121* 120* 115*  CREATININE 3.68* 3.51* 3.41*  CALCIUM 9.6 9.7 9.6     Liver Function Tests: No results for input(s): AST, ALT, ALKPHOS, BILITOT, PROT, ALBUMIN in the last 168 hours.   Antibiotics: Anti-infectives (From admission, onward)   None       DVT prophylaxis: Lovenox  Code Status: DNI  Family Communication: No family at bedside   Consultants:    Procedures:  Objective   Vitals:   Feb 10, 2020 2059 02/03/20 0400 02/03/20 0552 02/03/20 1328  BP:   (!) 152/62 (!) 173/71  Pulse: 72  64 65  Resp: 16  16 16   Temp:   98.7 F (37.1 C) 98.2 F (36.8 C)  TempSrc:   Oral Oral  SpO2: 99%  100% 99%  Weight:  116.4 kg    Height:        Intake/Output Summary (Last 24 hours) at 02/03/2020 1619 Last data filed at 02/03/2020 1300 Gross per 24 hour  Intake 750 ml  Output 2050 ml  Net -1300 ml    12/28 1901 - 12/30 0700 In: 630 [P.O.:510] Out: 2683 [Urine:4050]  Filed Weights   02/01/20 1125 02/10/20 0500 02/03/20 0400  Weight: 122.5 kg 117.2 kg 116.4 kg    Physical Examination:  General-appears in no acute  distress Heart-S1-S2, regular, no murmur auscultated Lungs-clear to auscultation bilaterally, no wheezing or crackles auscultated Abdomen-soft, nontender, no organomegaly Extremities-no edema in the lower extremities Neuro-alert, oriented x3, no focal deficit noted   Status is: Inpatient  Dispo: The patient is from: Home              Anticipated d/c is to: Home              Anticipated d/c date is: 02/04/2020              Patient currently not stable for discharge  Barrier to discharge-ongoing treatment for acute kidney injury            Data Reviewed:   Recent Results (from the past 240 hour(s))  SARS CORONAVIRUS 2 (TAT 6-24 HRS) Nasopharyngeal Nasopharyngeal Swab     Status: None   Collection Time: 02/01/20  2:30 PM   Specimen: Nasopharyngeal Swab  Result Value Ref Range Status   SARS Coronavirus 2 NEGATIVE NEGATIVE Final    Comment: (NOTE) SARS-CoV-2 target nucleic acids are NOT DETECTED.  The SARS-CoV-2 RNA is generally detectable in upper and lower respiratory specimens during the acute phase of infection. Negative results do not preclude SARS-CoV-2 infection, do not rule out co-infections with other pathogens, and should not be used as the sole basis for treatment or other patient management decisions. Negative results must be combined with clinical observations, patient history, and epidemiological information. The expected result is Negative.  Fact Sheet for Patients: SugarRoll.be  Fact Sheet for Healthcare Providers: https://www.woods-mathews.com/  This test is not yet approved or cleared by the [place] FDA and  has been authorized for detection and/or diagnosis of SARS-CoV-2 by FDA under an Emergency Use Authorization (EUA). This EUA will remain  in effect (meaning this test can be used) for the duration of the COVID-19 declaration under Se ction 564(b)(1) of the Act, 21 U.S.C. section 360bbb-3(b)(1),  unless the authorization is terminated or revoked sooner.  Performed at [facility], [place] [address] [place], [place] [address]     No results for input(s): LIPASE, AMYLASE in the last 168 hours. No results for input(s): AMMONIA in the last 168 hours.  Cardiac Enzymes: No results for input(s): CKTOTAL, CKMB, CKMBINDEX, TROPONINI in the last 168 hours. BNP (last 3 results) Recent Labs    02/01/20 1231  BNP 295.3*    ProBNP (last 3 results) Recent Labs    01/25/20 1506  PROBNP 2,507*    Studies:  US RENAL  Result Date: 2020/02/10 CLINICAL DATA:  69 year old female with acute renal failure on stage 4 chronic kidney disease. EXAM:  RENAL / URINARY TRACT ULTRASOUND COMPLETE COMPARISON:  Renal ultrasound 12/24/2005. FINDINGS: Right Kidney: Renal measurements: 10.5 x 4.4 x 5.2 cm = volume: 127 mL. Echogenic right renal cortex (image 2). No hydronephrosis. Several small benign appearing renal cysts, up to 18 mm. Trace pararenal space fluid (image 7). Left Kidney: Renal measurements: 10.8 x 5.2 x 5.6 cm = volume: 167 mL. Echogenic left renal cortex (image 27). No hydronephrosis. Fewer than 10 small benign appearing left renal cysts, up to 20 mm. Bladder: Appears normal for degree of bladder distention. Other: None. IMPRESSION: 1. No acute renal finding. 2. Bilateral chronic medical renal disease and a small number of benign renal cysts. Electronically Signed   By: Genevie Ann M.D.   On: 02/02/2020 06:33   ECHOCARDIOGRAM COMPLETE  Result Date: 02/02/2020    ECHOCARDIOGRAM REPORT   Patient Name:   Lazette ANN Biever Date of Exam: 02/02/2020 Medical Rec #:  [id number]         Height:       70.0 in Accession #:    [id number]        Weight:       258.4 lb Date of Birth:  1950-12-22          BSA:          2.327 m Patient Age:    30 years          BP:           157/66 mmHg Patient Gender: F                 HR:           61 bpm. Exam Location:  Inpatient Procedure: 2D Echo, Color Doppler and  Cardiac Doppler Indications:    G40.10 Acute diastolic (congestive) heart failure  History:        Patient has prior history of Echocardiogram examinations, most                 recent 03/11/2018. CHF; Risk Factors:Hypertension, Diabetes,                 Dyslipidemia and Sleep Apnea.  Sonographer:    Raquel Sarna Senior RDCS Referring Phys: [id number] [place]  1. Left ventricular ejection fraction, by estimation, is 65 to 70%. The left ventricle has normal function. The left ventricle has no regional wall motion abnormalities. There is severe left ventricular hypertrophy. Left ventricular diastolic parameters  are consistent with Grade II diastolic dysfunction (pseudonormalization). Elevated left atrial pressure.  2. Right ventricular systolic function is normal. The right ventricular size is normal. There is mildly elevated pulmonary artery systolic pressure. The estimated right ventricular systolic pressure is 44.0 mmHg.  3. A small pericardial effusion is present.  4. Left atrial size was severely dilated.  5. The mitral valve is abnormal. Severe mitral annular calcification. Trivial mitral valve regurgitation. Moderate mitral stenosis. MG 8 mmHg at HR 65 bpm, MVA 1.5 cm^2 by continuity equation  6. The aortic valve is tricuspid. There is moderate thickening of the aortic valve. Aortic valve regurgitation is not visualized. Mild aortic valve stenosis. Vmax 2.9 m/s, MG 12mmHg, AVA 1.9 cm^2, DI 0.6  7. The inferior vena cava is dilated in size with <50% respiratory variability, suggesting right atrial pressure of 15 mmHg. FINDINGS  Left Ventricle: Left ventricular ejection fraction, by estimation, is 65 to 70%. The left ventricle has normal function. The left ventricle has no regional wall motion abnormalities. The left ventricular internal  cavity size was normal in size. There is  severe left ventricular hypertrophy. Left ventricular diastolic parameters are consistent with Grade II diastolic dysfunction  (pseudonormalization). Elevated left atrial pressure. Right Ventricle: The right ventricular size is normal. No increase in right ventricular wall thickness. Right ventricular systolic function is normal. There is mildly elevated pulmonary artery systolic pressure. The tricuspid regurgitant velocity is 2.66  m/s, and with an assumed right atrial pressure of 15 mmHg, the estimated right ventricular systolic pressure is 84.1 mmHg. Left Atrium: Left atrial size was severely dilated. Right Atrium: Right atrial size was normal in size. Pericardium: A small pericardial effusion is present. Mitral Valve: The mitral valve is abnormal. Severe mitral annular calcification. Trivial mitral valve regurgitation. Moderate mitral valve stenosis. MV peak gradient, 13.1 mmHg. The mean mitral valve gradient is 6.8 mmHg. Tricuspid Valve: The tricuspid valve is normal in structure. Tricuspid valve regurgitation is trivial. Aortic Valve: The aortic valve is tricuspid. There is moderate thickening of the aortic valve. Aortic valve regurgitation is not visualized. Mild aortic stenosis is present. Aortic valve mean gradient measures 19.0 mmHg. Aortic valve peak gradient measures 33.9 mmHg. Aortic valve area, by VTI measures 1.92 cm. Pulmonic Valve: The pulmonic valve was not well visualized. Pulmonic valve regurgitation is not visualized. Aorta: The aortic root and ascending aorta are structurally normal, with no evidence of dilitation. Venous: The inferior vena cava is dilated in size with less than 50% respiratory variability, suggesting right atrial pressure of 15 mmHg. IAS/Shunts: The interatrial septum was not well visualized.  LEFT VENTRICLE PLAX 2D LVIDd:         4.50 cm  Diastology LVIDs:         2.60 cm  LV e' medial:    5.87 cm/s LV PW:         1.90 cm  LV E/e' medial:  27.9 LV IVS:        1.40 cm  LV e' lateral:   6.74 cm/s LVOT diam:     2.00 cm  LV E/e' lateral: 24.3 LV SV:         124 LV SV Index:   53 LVOT Area:     3.14 cm   RIGHT VENTRICLE RV S prime:     14.90 cm/s TAPSE (M-mode): 2.8 cm LEFT ATRIUM              Index       RIGHT ATRIUM           Index LA diam:        4.90 cm  2.11 cm/m  RA Area:     19.30 cm LA Vol (A2C):   118.0 ml 50.70 ml/m RA Volume:   50.80 ml  21.83 ml/m LA Vol (A4C):   169.0 ml 72.61 ml/m LA Biplane Vol: 141.0 ml 60.58 ml/m  AORTIC VALVE AV Area (Vmax):    1.95 cm AV Area (Vmean):   1.78 cm AV Area (VTI):     1.92 cm AV Vmax:           291.00 cm/s AV Vmean:          208.000 cm/s AV VTI:            0.648 m AV Peak Grad:      33.9 mmHg AV Mean Grad:      19.0 mmHg LVOT Vmax:         181.00 cm/s LVOT Vmean:        118.000 cm/s LVOT VTI:  0.395 m LVOT/AV VTI ratio: 0.61  AORTA Ao Root diam: 3.30 cm Ao Asc diam:  3.40 cm MITRAL VALVE                TRICUSPID VALVE MV Area (PHT): 1.56 cm     TR Peak grad:   28.3 mmHg MV Peak grad:  13.1 mmHg    TR Vmax:        266.00 cm/s MV Mean grad:  6.8 mmHg MV Vmax:       1.81 m/s     SHUNTS MV Vmean:      124.5 cm/s   Systemic VTI:  0.40 m MV Decel Time: 486 msec     Systemic Diam: 2.00 cm MV E velocity: 164.00 cm/s MV A velocity: 169.00 cm/s MV E/A ratio:  0.97 Oswaldo Milian MD Electronically signed by Oswaldo Milian MD Signature Date/Time: 02/02/2020/2:56:53 PM    Final        Oswald Hillock   [facility] If 7PM-7AM, please contact night-coverage at [email or web address], Office  [phone]   02/03/2020, 4:19 PM  LOS: 2 days

## 2020-02-03 NOTE — Progress Notes (Signed)
Hypoglycemic Event  CBG: 64  Treatment: 4 oz soda/juice  Symptoms: None  Follow-up CBG: Time:5:54 AM CBG Result:70  Possible Reasons for Event: Medication regimen   Tiffany [name] [name]

## 2020-02-03 NOTE — Progress Notes (Addendum)
Inpatient Diabetes Program Recommendations  AACE/ADA: New Consensus Statement on Inpatient Glycemic Control   Target Ranges:  Prepandial:   less than 140 mg/dL      Peak postprandial:   less than 180 mg/dL (1-2 hours)      Critically ill patients:  140 - 180 mg/dL  Results for Tiffany [name], Tiffany [name] (MRN [id number]) as of 02/03/2020 11:44  Ref. Range 02/03/2020 05:54 02/03/2020 07:25 02/03/2020 11:12  Glucose-Capillary Latest Ref Range: 70 - 99 mg/dL 70 142 (H) 129 (H)  Results for Tiffany [name], Tiffany [name] (MRN [id number]) as of 02/03/2020 11:44  Ref. Range 02/03/2020 04:29  Glucose Latest Ref Range: 70 - 99 mg/dL 64 (L)   Results for Tiffany [name], Tiffany [name] (MRN [id number]) as of 02/03/2020 11:44  Ref. Range 02/02/2020 05:23 02/02/2020 05:48 02/02/2020 07:21 02/02/2020 11:32 02/02/2020 16:46 02/02/2020 20:19  Glucose-Capillary Latest Ref Range: 70 - 99 mg/dL 45 (L) 80 68 (L) 135 (H) 70 201 (H)   Review of Glycemic Control  Diabetes history: DM2 Outpatient Diabetes medications: Lantus 18 units QHS Current orders for Inpatient glycemic control: Lantus 15 units QHS, Novolog 0-15 units TID with meals, Novolog 0-5 units QHS  Inpatient Diabetes Program Recommendations:    Insulin: Please consider decreasing Lantus to 12 units QHS and Novolog correction to 0-9 units TID with meals.  Thanks, Barnie Alderman, RN, MSN, CDE Diabetes Coordinator Inpatient Diabetes Program [phone] (Team Pager from 8am to 5pm)

## 2020-02-03 NOTE — Progress Notes (Signed)
Patient c/o intolerance to CPAP nasal mask. She is used to nasal pillows. Instructed to have home apparatus brought to her, but she declines at this time. CPAP equipment remains at the bedside, should she decide to attempt the nasal mask again tonight.

## 2020-02-03 NOTE — Plan of Care (Signed)
  Problem: Education: Goal: Ability to demonstrate management of disease process will improve Outcome: Progressing Goal: Ability to verbalize understanding of medication therapies will improve Outcome: Progressing   Problem: Activity: Goal: Capacity to carry out activities will improve Outcome: Progressing   Problem: Cardiac: Goal: Ability to achieve and maintain adequate cardiopulmonary perfusion will improve Outcome: Progressing   Problem: Education: Goal: Knowledge of General Education information will improve Description: Including pain rating scale, medication(s)/side effects and non-pharmacologic comfort measures Outcome: Progressing   Problem: Health Behavior/Discharge Planning: Goal: Ability to manage health-related needs will improve Outcome: Progressing   Problem: Clinical Measurements: Goal: Will remain free from infection Outcome: Progressing Goal: Respiratory complications will improve Outcome: Progressing   Problem: Activity: Goal: Risk for activity intolerance will decrease Outcome: Progressing   Problem: Coping: Goal: Level of anxiety will decrease Outcome: Completed/Met   Problem: Pain Managment: Goal: General experience of comfort will improve Outcome: Completed/Met

## 2020-02-03 NOTE — TOC Progression Note (Signed)
Transition of Care [facility]) - Progression Note    Patient Details  Name: Tiffany [name] MRN: [id number] Date of Birth: [birth year]  Transition of Care [facility]) CM/SW Contact  Purcell Mouton, RN Phone Number: 02/03/2020, 2:27 PM  Clinical Narrative:     TOC will continue to follow for discharge needs.  Expected Discharge Plan: [place] Barriers to Discharge: No Barriers Identified  Expected Discharge Plan and Services Expected Discharge Plan: [place] arrangements for the past 2 months: Single Family Home                                       Social Determinants of Health (SDOH) Interventions    Readmission Risk Interventions No flowsheet data found.

## 2020-02-04 DIAGNOSIS — I5031 Acute diastolic (congestive) heart failure: Secondary | ICD-10-CM | POA: Diagnosis not present

## 2020-02-04 DIAGNOSIS — N184 Chronic kidney disease, stage 4 (severe): Secondary | ICD-10-CM | POA: Diagnosis not present

## 2020-02-04 DIAGNOSIS — N179 Acute kidney failure, unspecified: Secondary | ICD-10-CM | POA: Diagnosis not present

## 2020-02-04 LAB — BASIC METABOLIC PANEL
Anion gap: 10 (ref 5–15)
BUN: 120 mg/dL — ABNORMAL HIGH (ref 8–23)
CO2: 25 mmol/L (ref 22–32)
Calcium: 9.2 mg/dL (ref 8.9–10.3)
Chloride: 107 mmol/L (ref 98–111)
Creatinine, Ser: 3.7 mg/dL — ABNORMAL HIGH (ref 0.44–1.00)
GFR, Estimated: 13 mL/min — ABNORMAL LOW (ref >60)
Glucose, Bld: 99 mg/dL (ref 70–99)
Potassium: 4.4 mmol/L (ref 3.5–5.1)
Sodium: 142 mmol/L (ref 135–145)

## 2020-02-04 LAB — GLUCOSE, CAPILLARY
Glucose-Capillary: 79 mg/dL (ref 70–99)
Glucose-Capillary: 134 mg/dL — ABNORMAL HIGH (ref 70–99)

## 2020-02-04 MED ORDER — DARBEPOETIN ALFA 100 MCG/0.5ML IJ SOSY
100.0000 ug | PREFILLED_SYRINGE | Freq: Once | INTRAMUSCULAR | Status: AC
Start: 2020-02-04 — End: 2020-02-04
  Administered 2020-02-04: 100 ug via SUBCUTANEOUS
  Filled 2020-02-04: qty 0.5

## 2020-02-04 MED ORDER — EPOETIN ALFA-EPBX 40000 UNIT/ML IJ SOLN: 30000.0000 [IU] | Freq: Once | INTRAMUSCULAR | Status: DC

## 2020-02-04 NOTE — Plan of Care (Signed)
  Problem: Education: Goal: Ability to demonstrate management of disease process will improve Outcome: Progressing   Problem: Activity: Goal: Capacity to carry out activities will improve Outcome: Progressing   Problem: Education: Goal: Knowledge of General Education information will improve Description: Including pain rating scale, medication(s)/side effects and non-pharmacologic comfort measures Outcome: Progressing   Problem: Health Behavior/Discharge Planning: Goal: Ability to manage health-related needs will improve Outcome: Progressing   Problem: Clinical Measurements: Goal: Will remain free from infection Outcome: Progressing Goal: Cardiovascular complication will be avoided Outcome: Progressing   Problem: Activity: Goal: Risk for activity intolerance will decrease Outcome: Progressing

## 2020-02-04 NOTE — Plan of Care (Signed)
  Problem: Cardiac: Goal: Ability to achieve and maintain adequate cardiopulmonary perfusion will improve Outcome: Progressing   Problem: Education: Goal: Knowledge of General Education information will improve Description: Including pain rating scale, medication(s)/side effects and non-pharmacologic comfort measures Outcome: Progressing   Problem: Education: Goal: Ability to demonstrate management of disease process will improve Outcome: Adequate for Discharge   Problem: Activity: Goal: Capacity to carry out activities will improve Outcome: Adequate for Discharge   Problem: Clinical Measurements: Goal: Respiratory complications will improve Outcome: Adequate for Discharge   Problem: Clinical Measurements: Goal: Will remain free from infection Outcome: Completed/Met

## 2020-02-04 NOTE — Plan of Care (Signed)
  Problem: Education: Goal: Ability to demonstrate management of disease process will improve 02/04/2020 1248 by Lennie Hummer, RN Outcome: Adequate for Discharge 02/04/2020 1248 by Lennie Hummer, RN Outcome: Adequate for Discharge 02/04/2020 1234 by Lennie Hummer, RN Outcome: Adequate for Discharge Goal: Ability to verbalize understanding of medication therapies will improve 02/04/2020 1248 by Lennie Hummer, RN Outcome: Adequate for Discharge 02/04/2020 1248 by Lennie Hummer, RN Outcome: Adequate for Discharge Goal: Individualized Educational Video(s) 02/04/2020 1248 by Lennie Hummer, RN Outcome: Adequate for Discharge 02/04/2020 1248 by Lennie Hummer, RN Outcome: Adequate for Discharge   Problem: Activity: Goal: Capacity to carry out activities will improve 02/04/2020 1248 by Lennie Hummer, RN Outcome: Adequate for Discharge 02/04/2020 1248 by Lennie Hummer, RN Outcome: Adequate for Discharge 02/04/2020 1234 by Lennie Hummer, RN Outcome: Adequate for Discharge   Problem: Cardiac: Goal: Ability to achieve and maintain adequate cardiopulmonary perfusion will improve 02/04/2020 1248 by Lennie Hummer, RN Outcome: Adequate for Discharge 02/04/2020 1248 by Lennie Hummer, RN Outcome: Adequate for Discharge 02/04/2020 1234 by Lennie Hummer, RN Outcome: Progressing   Problem: Education: Goal: Knowledge of General Education information will improve Description: Including pain rating scale, medication(s)/side effects and non-pharmacologic comfort measures 02/04/2020 1248 by Lennie Hummer, RN Outcome: Adequate for Discharge 02/04/2020 1248 by Lennie Hummer, RN Outcome: Adequate for Discharge 02/04/2020 1234 by Lennie Hummer, RN Outcome: Progressing   Problem: Health Behavior/Discharge Planning: Goal: Ability to manage health-related needs will improve 02/04/2020 1248 by Lennie Hummer, RN Outcome: Adequate for Discharge 02/04/2020 1248 by  Lennie Hummer, RN Outcome: Adequate for Discharge   Problem: Clinical Measurements: Goal: Ability to maintain clinical measurements within normal limits will improve 02/04/2020 1248 by Lennie Hummer, RN Outcome: Adequate for Discharge 02/04/2020 1248 by Lennie Hummer, RN Outcome: Adequate for Discharge Goal: Diagnostic test results will improve 02/04/2020 1248 by Lennie Hummer, RN Outcome: Adequate for Discharge 02/04/2020 1248 by Lennie Hummer, RN Outcome: Adequate for Discharge Goal: Respiratory complications will improve 02/04/2020 1248 by Lennie Hummer, RN Outcome: Adequate for Discharge 02/04/2020 1248 by Lennie Hummer, RN Outcome: Adequate for Discharge 02/04/2020 1234 by Lennie Hummer, RN Outcome: Adequate for Discharge Goal: Cardiovascular complication will be avoided 02/04/2020 1248 by Lennie Hummer, RN Outcome: Adequate for Discharge 02/04/2020 1248 by Lennie Hummer, RN Outcome: Adequate for Discharge   Problem: Activity: Goal: Risk for activity intolerance will decrease 02/04/2020 1248 by Lennie Hummer, RN Outcome: Adequate for Discharge 02/04/2020 1248 by Lennie Hummer, RN Outcome: Adequate for Discharge   Problem: Nutrition: Goal: Adequate nutrition will be maintained 02/04/2020 1248 by Lennie Hummer, RN Outcome: Adequate for Discharge 02/04/2020 1248 by Lennie Hummer, RN Outcome: Adequate for Discharge   Problem: Elimination: Goal: Will not experience complications related to bowel motility 02/04/2020 1248 by Lennie Hummer, RN Outcome: Adequate for Discharge 02/04/2020 1248 by Lennie Hummer, RN Outcome: Adequate for Discharge Goal: Will not experience complications related to urinary retention 02/04/2020 1248 by Lennie Hummer, RN Outcome: Adequate for Discharge 02/04/2020 1248 by Lennie Hummer, RN Outcome: Adequate for Discharge   Problem: Safety: Goal: Ability to remain free from injury will improve 02/04/2020  1248 by Lennie Hummer, RN Outcome: Adequate for Discharge 02/04/2020 1248 by Lennie Hummer, RN Outcome: Adequate for Discharge   Problem: Skin Integrity: Goal: Risk for impaired skin integrity will decrease 02/04/2020 1248 by Lennie Hummer, RN Outcome: Adequate for Discharge 02/04/2020 1248 by Lennie Hummer, RN Outcome: Adequate for Discharge

## 2020-02-04 NOTE — Discharge Summary (Signed)
Physician Discharge Summary  Tiffany [name] MGN:[id number] DOB: 06/06/1950 DOA: 02/01/2020  PCP: Nolene Ebbs, MD  Admit date: 02/01/2020 Discharge date: 02/04/2020  Time spent: 50  minutes  Recommendations for Outpatient Follow-up:  1. Follow up nephrology in 2 weeks  Discharge Diagnoses:  Active Problems:   Acute CHF (congestive heart failure) ([place])   Discharge Condition: stable  Diet recommendation: Heart healthy diet  Filed Weights   02/02/20 0500 02/03/20 0400 02/04/20 0535  Weight: 117.2 kg 116.4 kg 114.5 kg    History of present illness:  69 year old female with history of HFpEF, CKD stage IV, hypertension, hyperlipidemia presents with shortness of breath worsening over past 2 weeks. She has been taking her regular heart failure meds but they were not helping so her PCP recommended to increase the dose of torsemide. She eventually went stress test which was okay and was supposed to go next week for an echocardiogram. Patient was found to have elevated BNP, renal function had worsened. She was given Lasix. Nephrology was consulted.   Hospital Course:  1. Acute on chronic HFpEF-patient was given Lasix 80 mg 3 times daily, breathing is improved. Patient was continued on Lasix 80 mg IV twice daily.  At this time patient is back to baseline.  She will be discharged on Demadex 100 mg alternating with 150 mg as per nephrology recommendations. 2. Acute kidney injury on CKD stage IV-nephrology consulted, creatinine slightly better today at  3.41 .  Treated with Lasix 80 mg IV every 12 hours.  Renal ultrasound is unremarkable.  Discharged on Demadex as above.  Follow-up nephrology in 2 weeks.  Patient has an appointment to see nephrology as outpatient. 3. Diabetes mellitus type 2-continue Lantus 18 units subcu daily. 4. Hypothyroidism- continue Synthroid 5. Hypertension-blood pressure slightly elevated, continue amlodipine, Coreg, hydralazine, clonidine.   Procedures:  Renal  ultrasound  Consultations:  Nephrology  Discharge Exam: Vitals:   02/03/20 2011 02/04/20 0535  BP: (!) 153/62 (!) 170/67  Pulse: 63 68  Resp: 18 16  Temp: 98.3 F (36.8 C) 97.9 F (36.6 C)  SpO2: 100% 95%    General: Appears in no acute distress Cardiovascular: S1-S2, regular Respiratory: Clear to auscultation bilaterally  Discharge Instructions   Discharge Instructions    Diet - low sodium heart healthy   Complete by: As directed    Increase activity slowly   Complete by: As directed      Allergies as of 02/04/2020   No Known Allergies     Medication List    TAKE these medications   acetaminophen 650 MG CR tablet Commonly known as: TYLENOL Take 650 mg by mouth every 8 (eight) hours as needed for pain.   albuterol 108 (90 Base) MCG/ACT inhaler Commonly known as: VENTOLIN HFA Inhale 1-2 puffs into the lungs every 6 (six) hours as needed for wheezing or shortness of breath.   allopurinol 100 MG tablet Commonly known as: ZYLOPRIM Take 100 mg by mouth 2 (two) times daily.   amLODipine 10 MG tablet Commonly known as: NORVASC Take 10 mg by mouth 2 (two) times daily.   B COMPLEX PO Take 1 tablet by mouth daily.   B-D INS SYRINGE 0.5CC/30GX1/2" 30G X 1/2" 0.5 ML Misc Generic drug: Insulin Syringe-Needle U-100 TAKE AS DIRECTED TWICE A DAY   BIOTIN 5000 PO Take 5,000 mcg by mouth daily.   calcitRIOL 0.25 MCG capsule Commonly known as: ROCALTROL Take 0.5 mcg by mouth daily at 6 PM. (1700)   carvedilol 6.25 MG  tablet Commonly known as: COREG Take 6.25 mg by mouth in the morning and at bedtime.   cetirizine 10 MG tablet Commonly known as: ZYRTEC Take 10 mg by mouth daily as needed for allergies.   cloNIDine 0.3 mg/24hr patch Commonly known as: CATAPRES - Dosed in mg/24 hr Place 0.3 mg onto the skin every Friday.   Coenzyme Q10 100 MG capsule Take 100 mg by mouth daily.   Fish Oil 500 MG Caps Take 500 mg by mouth daily in the afternoon.    hydrALAZINE 25 MG tablet Commonly known as: APRESOLINE Take 25 mg by mouth 3 (three) times daily.   insulin glargine 100 UNIT/ML injection Commonly known as: LANTUS Inject 18 Units into the skin at bedtime.   isosorbide mononitrate 30 MG 24 hr tablet Commonly known as: IMDUR Take 1 tablet (30 mg total) by mouth daily.   levothyroxine 112 MCG tablet Commonly known as: SYNTHROID Take 224-336 mcg by mouth See admin instructions. Take 3 tablets (336 mcg) by mouth on Sundays at 2200 & take 2 tablets (224 mcg) by mouth on Mondays, Tuesdays, Wednesdays, Thursdays, Fridays, & Saturdays at 2200.   NON FORMULARY Take 1 tablet by mouth daily. Renadyl for kidneys   polyethylene glycol 17 g packet Commonly known as: MIRALAX / GLYCOLAX Take 17 g by mouth 2 (two) times daily.   PRESERVISION AREDS PO Take 1 tablet by mouth in the morning and at bedtime.   rosuvastatin 10 MG tablet Commonly known as: CRESTOR Take 10 mg by mouth every evening.   Systane Complete 0.6 % Soln Generic drug: Propylene Glycol Place 1 drop into both eyes in the morning, at noon, in the evening, and at bedtime.   torsemide 100 MG tablet Commonly known as: DEMADEX Take 100-150 mg by mouth See admin instructions. Alternating between 1 tablet (100 mg) & 1.5 tablets (150 mg) every other day   TURMERIC PO Take 1 tablet by mouth daily.   zolpidem 5 MG tablet Commonly known as: AMBIEN 1 at bedtime as needed for sleep What changed:   how much to take  how to take this  when to take this  reasons to take this      No Known Allergies  Follow-up Information    Justin Mend, MD Follow up on 02/16/2020.   Specialty: Internal Medicine Why: follow up appt w/ kidney doctor changed to Jan 12th at 10:30 am Contact information: [address] [place] [address] [phone]        Nolene Ebbs, MD Follow up in 2 week(s).   Specialty: Internal Medicine Contact information: [place] [address] [phone]        Belva Crome, MD .   Specialty: Cardiology Contact information: [phone] [address] [place] [address] [phone]                The results of significant diagnostics from this hospitalization (including imaging, microbiology, ancillary and laboratory) are listed below for reference.    Significant Diagnostic Studies: DG Chest 2 View  Result Date: 02/01/2020 CLINICAL DATA:  Dyspnea. EXAM: CHEST - 2 VIEW COMPARISON:  September 24, 2019. FINDINGS: Stable cardiomegaly. Mild central pulmonary vascular congestion is noted. Mild bibasilar interstitial densities are noted which may represent edema or possibly inflammation. No pneumothorax or pleural effusion is noted. Bony thorax is unremarkable. IMPRESSION: Mild central pulmonary vascular congestion. Mild bibasilar interstitial densities are noted which may represent edema or possibly inflammation. Electronically Signed  By: Marijo Conception M.D.   On: 02/01/2020 11:47   US RENAL  Result Date: 02/02/2020 CLINICAL DATA:  69 year old female with acute renal failure on stage 4 chronic kidney disease. EXAM: RENAL / URINARY TRACT ULTRASOUND COMPLETE COMPARISON:  Renal ultrasound 12/24/2005. FINDINGS: Right Kidney: Renal measurements: 10.5 x 4.4 x 5.2 cm = volume: 127 mL. Echogenic right renal cortex (image 2). No hydronephrosis. Several small benign appearing renal cysts, up to 18 mm. Trace pararenal space fluid (image 7). Left Kidney: Renal measurements: 10.8 x 5.2 x 5.6 cm = volume: 167 mL. Echogenic left renal cortex (image 27). No hydronephrosis. Fewer than 10 small benign appearing left renal cysts, up to 20 mm. Bladder: Appears normal for degree of bladder distention. Other: None. IMPRESSION: 1. No acute renal finding. 2. Bilateral chronic medical renal disease and a small number of benign renal cysts. Electronically Signed   By: Genevie Ann M.D.   On: 02/02/2020 06:33   MYOCARDIAL  PERFUSION IMAGING  Result Date: 01/27/2020  The left ventricular ejection fraction is hyperdynamic (>65%).  Nuclear stress EF: 67%.  There was no ST segment deviation noted during stress.  No T wave inversion was noted during stress.  The study is normal.  This is a low risk study.  [name], MD   ECHOCARDIOGRAM COMPLETE  Result Date: 02/02/2020    ECHOCARDIOGRAM REPORT   Patient Name:   [facility] Date of Exam: 02/02/2020 Medical Rec #:  [id number]         Height:       70.0 in Accession #:    [id number]        Weight:       258.4 lb Date of Birth:  25-Dec-1950          BSA:          2.327 m Patient Age:    76 years          BP:           157/66 mmHg Patient Gender: F                 HR:           61 bpm. Exam Location:  Inpatient Procedure: 2D Echo, Color Doppler and Cardiac Doppler Indications:    T61.44 Acute diastolic (congestive) heart failure  History:        Patient has prior history of Echocardiogram examinations, most                 recent 03/11/2018. CHF; Risk Factors:Hypertension, Diabetes,                 Dyslipidemia and Sleep Apnea.  Sonographer:    Raquel Sarna Senior RDCS Referring Phys: [id number] [place]  1. Left ventricular ejection fraction, by estimation, is 65 to 70%. The left ventricle has normal function. The left ventricle has no regional wall motion abnormalities. There is severe left ventricular hypertrophy. Left ventricular diastolic parameters  are consistent with Grade II diastolic dysfunction (pseudonormalization). Elevated left atrial pressure.  2. Right ventricular systolic function is normal. The right ventricular size is normal. There is mildly elevated pulmonary artery systolic pressure. The estimated right ventricular systolic pressure is 67.6 mmHg.  3. A small pericardial effusion is present.  4. Left atrial size was severely dilated.  5. The mitral valve is abnormal. Severe mitral annular calcification. Trivial mitral valve regurgitation.  Moderate mitral stenosis. MG 8 mmHg at HR 65 bpm, MVA  1.5 cm^2 by continuity equation  6. The aortic valve is tricuspid. There is moderate thickening of the aortic valve. Aortic valve regurgitation is not visualized. Mild aortic valve stenosis. Vmax 2.9 m/s, MG 38mmHg, AVA 1.9 cm^2, DI 0.6  7. The inferior vena cava is dilated in size with <50% respiratory variability, suggesting right atrial pressure of 15 mmHg. FINDINGS  Left Ventricle: Left ventricular ejection fraction, by estimation, is 65 to 70%. The left ventricle has normal function. The left ventricle has no regional wall motion abnormalities. The left ventricular internal cavity size was normal in size. There is  severe left ventricular hypertrophy. Left ventricular diastolic parameters are consistent with Grade II diastolic dysfunction (pseudonormalization). Elevated left atrial pressure. Right Ventricle: The right ventricular size is normal. No increase in right ventricular wall thickness. Right ventricular systolic function is normal. There is mildly elevated pulmonary artery systolic pressure. The tricuspid regurgitant velocity is 2.66  m/s, and with an assumed right atrial pressure of 15 mmHg, the estimated right ventricular systolic pressure is 34.1 mmHg. Left Atrium: Left atrial size was severely dilated. Right Atrium: Right atrial size was normal in size. Pericardium: A small pericardial effusion is present. Mitral Valve: The mitral valve is abnormal. Severe mitral annular calcification. Trivial mitral valve regurgitation. Moderate mitral valve stenosis. MV peak gradient, 13.1 mmHg. The mean mitral valve gradient is 6.8 mmHg. Tricuspid Valve: The tricuspid valve is normal in structure. Tricuspid valve regurgitation is trivial. Aortic Valve: The aortic valve is tricuspid. There is moderate thickening of the aortic valve. Aortic valve regurgitation is not visualized. Mild aortic stenosis is present. Aortic valve mean gradient measures 19.0 mmHg.  Aortic valve peak gradient measures 33.9 mmHg. Aortic valve area, by VTI measures 1.92 cm. Pulmonic Valve: The pulmonic valve was not well visualized. Pulmonic valve regurgitation is not visualized. Aorta: The aortic root and ascending aorta are structurally normal, with no evidence of dilitation. Venous: The inferior vena cava is dilated in size with less than 50% respiratory variability, suggesting right atrial pressure of 15 mmHg. IAS/Shunts: The interatrial septum was not well visualized.  LEFT VENTRICLE PLAX 2D LVIDd:         4.50 cm  Diastology LVIDs:         2.60 cm  LV e' medial:    5.87 cm/s LV PW:         1.90 cm  LV E/e' medial:  27.9 LV IVS:        1.40 cm  LV e' lateral:   6.74 cm/s LVOT diam:     2.00 cm  LV E/e' lateral: 24.3 LV SV:         124 LV SV Index:   53 LVOT Area:     3.14 cm  RIGHT VENTRICLE RV S prime:     14.90 cm/s TAPSE (M-mode): 2.8 cm LEFT ATRIUM              Index       RIGHT ATRIUM           Index LA diam:        4.90 cm  2.11 cm/m  RA Area:     19.30 cm LA Vol (A2C):   118.0 ml 50.70 ml/m RA Volume:   50.80 ml  21.83 ml/m LA Vol (A4C):   169.0 ml 72.61 ml/m LA Biplane Vol: 141.0 ml 60.58 ml/m  AORTIC VALVE AV Area (Vmax):    1.95 cm AV Area (Vmean):   1.78 cm AV Area (VTI):  1.92 cm AV Vmax:           291.00 cm/s AV Vmean:          208.000 cm/s AV VTI:            0.648 m AV Peak Grad:      33.9 mmHg AV Mean Grad:      19.0 mmHg LVOT Vmax:         181.00 cm/s LVOT Vmean:        118.000 cm/s LVOT VTI:          0.395 m LVOT/AV VTI ratio: 0.61  AORTA Ao Root diam: 3.30 cm Ao Asc diam:  3.40 cm MITRAL VALVE                TRICUSPID VALVE MV Area (PHT): 1.56 cm     TR Peak grad:   28.3 mmHg MV Peak grad:  13.1 mmHg    TR Vmax:        266.00 cm/s MV Mean grad:  6.8 mmHg MV Vmax:       1.81 m/s     SHUNTS MV Vmean:      124.5 cm/s   Systemic VTI:  0.40 m MV Decel Time: 486 msec     Systemic Diam: 2.00 cm MV E velocity: 164.00 cm/s MV A velocity: 169.00 cm/s MV E/A ratio:   0.97 Oswaldo Milian MD Electronically signed by Oswaldo Milian MD Signature Date/Time: 02/02/2020/2:56:53 PM    Final     Microbiology: Recent Results (from the past 240 hour(s))  SARS CORONAVIRUS 2 (TAT 6-24 HRS) Nasopharyngeal Nasopharyngeal Swab     Status: None   Collection Time: 02/01/20  2:30 PM   Specimen: Nasopharyngeal Swab  Result Value Ref Range Status   SARS Coronavirus 2 NEGATIVE NEGATIVE Final    Comment: (NOTE) SARS-CoV-2 target nucleic acids are NOT DETECTED.  The SARS-CoV-2 RNA is generally detectable in upper and lower respiratory specimens during the acute phase of infection. Negative results do not preclude SARS-CoV-2 infection, do not rule out co-infections with other pathogens, and should not be used as the sole basis for treatment or other patient management decisions. Negative results must be combined with clinical observations, patient history, and epidemiological information. The expected result is Negative.  Fact Sheet for Patients: SugarRoll.be  Fact Sheet for Healthcare Providers: https://www.woods-mathews.com/  This test is not yet approved or cleared by the [place] FDA and  has been authorized for detection and/or diagnosis of SARS-CoV-2 by FDA under an Emergency Use Authorization (EUA). This EUA will remain  in effect (meaning this test can be used) for the duration of the COVID-19 declaration under Se ction 564(b)(1) of the Act, 21 U.S.C. section 360bbb-3(b)(1), unless the authorization is terminated or revoked sooner.  Performed at [facility], [place] [address] [place], [place] [address]      Labs: Basic Metabolic Panel: Recent Labs  Lab 02/01/20 1231 02/02/20 0415 02/03/20 0429 02/04/20 0437  NA 140 141 140 142  K 4.3 4.2 4.0 4.4  CL 104 104 103 107  CO2 23 22 24 25   GLUCOSE 119* 69* 64* 99  BUN 121* 120* 115* 120*  CREATININE 3.68* 3.51* 3.41* 3.70*  CALCIUM 9.6  9.7 9.6 9.2   Liver Function Tests: No results for input(s): AST, ALT, ALKPHOS, BILITOT, PROT, ALBUMIN in the last 168 hours. No results for input(s): LIPASE, AMYLASE in the last 168 hours. No results for input(s): AMMONIA in the last 168 hours.  CBC: Recent Labs  Lab 02/01/20 1231 02/02/20 0415  WBC 4.0 4.7  HGB 10.3* 9.7*  HCT 33.3* 31.3*  MCV 85.4 85.1  PLT 132* 124*   Cardiac Enzymes: No results for input(s): CKTOTAL, CKMB, CKMBINDEX, TROPONINI in the last 168 hours. BNP: BNP (last 3 results) Recent Labs    02/01/20 1231  BNP 295.3*    ProBNP (last 3 results) Recent Labs    01/25/20 1506  PROBNP 2,507*    CBG: Recent Labs  Lab 02/03/20 1112 02/03/20 1637 02/03/20 2012 02/04/20 0733 02/04/20 1136  GLUCAP 129* 177* 148* 79 134*       Signed:  Oswald Hillock MD.  [facility] 02/04/2020, 11:54 AM

## 2020-02-04 NOTE — Progress Notes (Signed)
[facility] Progress Note  Subjective: 2650 UOP yesterday, creat up 3.7 and BUN 120.    Vitals:   02/03/20 0552 02/03/20 1328 02/03/20 2011 02/04/20 0535  BP: (!) 152/62 (!) 173/71 (!) 153/62 (!) 170/67  Pulse: 64 65 63 68  Resp: _0 Temp: 98.7 F (37.1 C) 98.2 F (36.8 C) 98.3 F (36.8 C) 97.9 F (36.6 C)  TempSrc: Oral Oral Oral Oral  SpO2: 100% 99% 100% 95%  Weight:    114.5 kg  Height:        Exam: Gen alert, on distress No jvd or bruits Chest clear bilat to bases RRR +2/6 sem, no RG Abd soft ntnd no mass or ascites +bs Ext improving bilat LE edema, no hip edema Neuro is alert, Ox 3 , nf    Home meds:  - norvasc 10 bid/ coreg 6.25 bid/ clonidine patch 0.3 q wk/ hydralazine 25 tid/ demadex 100 mg alt w/ 150 mg qod  - zyloprim 100 bid/ synthroid qd  - imdur 30/ crestor 10  - lantus insulin 18u hs  - prn's/ vitamins/ supplements    Date                           Creat               eGFR   2008- 11                    1.5- 1.9               2012- 14                    1.70- 2.15        28   2018- 19                    2.35- 2.71        20- 24   2020                          2.82- 2.93        18- 19   Jan- jun 2021            2.62- 3.22        17- 21   Dec 08, 2019               3.23                 15           01/25/20                    3.02                 18    02/01/20                   3.68                 13    CXR 12/28 - IMPRESSION: Mild central pulmonary vascular congestion. Mild bibasilar interstitial densities are noted which may represent edema or possibly inflammation.   UA 12/28 - negative, prot 100       Assessment/ Plan: 1. AKI on CKD IV - b/l creat 2.8- 3.2, eGFR 15- 18, f/b Dr Johnney Ou.  Pt states she will not do dialysis.  Creat 3.6 on presentation here w/ SOB/ DOE and leg edema, mild edema by CXR. Admitted for diuresis and responded well, down 8 kg, leg edema mostly resolved and SOB resolved. Creat mid to high 3"s  which is worse than early this year. Suspect progressive CKD. Have moved up pt's CKD appt w/ Dr Johnney Ou to Jan 12th, see dc section.  Can dc today on usual high dose torsemide (100 mg alternating 150 mg every other day). Important to restrict salt/ fluid intake as well at home, have d/w patient at length.   2. DOE - resolved 3. 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 HTN/ vol - getting home meds x 4 5. HL - per pmd 6. Anemia - will give IV darbe infusion today to make up for the EPO/ retacrit OP infusion she missed on 12/29      Rob Jakevion Arney 02/04/2020, 7:19 AM   Recent Labs  Lab 02/01/20 1231 02/02/20 0415 02/03/20 0429 02/04/20 0437  K 4.3 4.2 4.0 4.4  BUN 121* 120* 115* 120*  CREATININE 3.68* 3.51* 3.41* 3.70*  CALCIUM 9.6 9.7 9.6 9.2  HGB 10.3* 9.7*  --   --    Inpatient medications: . allopurinol  100 mg Oral BID  . amLODipine  10 mg Oral BID  . calcitRIOL  0.5 mcg Oral Q supper  . cloNIDine  0.3 mg Transdermal Q Fri  . hydrALAZINE  25 mg Oral TID  . insulin aspart  0-5 Units Subcutaneous QHS  . insulin aspart  0-9 Units Subcutaneous TID WC  . insulin glargine  12 Units Subcutaneous QHS  . isosorbide mononitrate  30 mg Oral Daily  . levothyroxine  224 mcg Oral Once per day on Mon Tue Wed Thu Fri Sat  . [START ON 02/06/2020] levothyroxine  336 mcg Oral Weekly  . loratadine  10 mg Oral Daily  . pneumococcal 23 valent vaccine  0.5 mL Intramuscular Tomorrow-1000  . polyvinyl alcohol  1 drop Both Eyes TID  . rosuvastatin  10 mg Oral QPM    acetaminophen **OR** acetaminophen, albuterol, ondansetron **OR** ondansetron (ZOFRAN) IV, zolpidem

## 2020-02-05 DIAGNOSIS — U071 COVID-19: Secondary | ICD-10-CM

## 2020-02-05 HISTORY — DX: COVID-19: U07.1

## 2020-02-07 ENCOUNTER — Other Ambulatory Visit (HOSPITAL_COMMUNITY): Payer: Medicare Other

## 2020-02-16 ENCOUNTER — Encounter (HOSPITAL_COMMUNITY): Payer: Medicare Other

## 2020-02-16 ENCOUNTER — Ambulatory Visit (HOSPITAL_COMMUNITY)
Admission: RE | Admit: 2020-02-16 | Discharge: 2020-02-16 | Disposition: A | Discharge status: Home / Self Care | Payer: Medicare Other | Source: Ambulatory Visit | Attending: Internal Medicine | Admitting: Internal Medicine

## 2020-02-16 ENCOUNTER — Other Ambulatory Visit: Payer: Self-pay

## 2020-02-16 ENCOUNTER — Encounter: Payer: Self-pay | Admitting: Internal Medicine

## 2020-02-16 VITALS — BP 143/67 | HR 61 | Temp 97.4°F | Resp 18

## 2020-02-16 DIAGNOSIS — D631 Anemia in chronic kidney disease: Secondary | ICD-10-CM | POA: Insufficient documentation

## 2020-02-16 DIAGNOSIS — N183 Chronic kidney disease, stage 3 unspecified: Secondary | ICD-10-CM | POA: Insufficient documentation

## 2020-02-16 LAB — RENAL FUNCTION PANEL
Albumin: 3.8 g/dL (ref 3.5–5.0)
Anion gap: 16 — ABNORMAL HIGH (ref 5–15)
BUN: 121 mg/dL — ABNORMAL HIGH (ref 8–23)
CO2: 21 mmol/L — ABNORMAL LOW (ref 22–32)
Calcium: 9.7 mg/dL (ref 8.9–10.3)
Chloride: 103 mmol/L (ref 98–111)
Creatinine, Ser: 4.14 mg/dL — ABNORMAL HIGH (ref 0.44–1.00)
GFR, Estimated: 11 mL/min — ABNORMAL LOW (ref >60)
Glucose, Bld: 144 mg/dL — ABNORMAL HIGH (ref 70–99)
Phosphorus: 5.1 mg/dL — ABNORMAL HIGH (ref 2.5–4.6)
Potassium: 4.2 mmol/L (ref 3.5–5.1)
Sodium: 140 mmol/L (ref 135–145)

## 2020-02-16 LAB — IRON AND TIBC
Iron: 62 ug/dL (ref 28–170)
Saturation Ratios: 21 % (ref 10.4–31.8)
TIBC: 302 ug/dL (ref 250–450)
UIBC: 240 ug/dL

## 2020-02-16 LAB — POCT HEMOGLOBIN-HEMACUE: Hemoglobin: 10 g/dL — ABNORMAL LOW (ref 12.0–15.0)

## 2020-02-16 LAB — FERRITIN: Ferritin: 744 ng/mL — ABNORMAL HIGH (ref 11–307)

## 2020-02-16 MED ORDER — EPOETIN ALFA-EPBX 10000 UNIT/ML IJ SOLN
INTRAMUSCULAR | Status: AC
  Administered 2020-02-16: 30000 [IU]
  Filled 2020-02-16: qty 3

## 2020-02-16 MED ORDER — EPOETIN ALFA-EPBX 40000 UNIT/ML IJ SOLN: 30000.0000 [IU] | INTRAMUSCULAR | Status: DC

## 2020-02-17 LAB — PTH, INTACT AND CALCIUM
Calcium, Total (PTH): 9.6 mg/dL (ref 8.7–10.3)
PTH: 123 pg/mL — ABNORMAL HIGH (ref 15–65)

## 2020-03-01 ENCOUNTER — Other Ambulatory Visit: Payer: Self-pay

## 2020-03-01 ENCOUNTER — Ambulatory Visit (HOSPITAL_COMMUNITY)
Admission: RE | Admit: 2020-03-01 | Discharge: 2020-03-01 | Disposition: A | Discharge status: Home / Self Care | Payer: Medicare Other | Source: Ambulatory Visit | Attending: Internal Medicine | Admitting: Internal Medicine

## 2020-03-01 VITALS — BP 139/75 | HR 57 | Temp 97.5°F | Resp 18

## 2020-03-01 DIAGNOSIS — N185 Chronic kidney disease, stage 5: Secondary | ICD-10-CM | POA: Diagnosis present

## 2020-03-01 LAB — POCT HEMOGLOBIN-HEMACUE: Hemoglobin: 10.2 g/dL — ABNORMAL LOW (ref 12.0–15.0)

## 2020-03-01 MED ORDER — EPOETIN ALFA-EPBX 40000 UNIT/ML IJ SOLN
INTRAMUSCULAR | Status: AC
  Administered 2020-03-01: 30000 [IU]
  Filled 2020-03-01: qty 1

## 2020-03-01 MED ORDER — EPOETIN ALFA-EPBX 40000 UNIT/ML IJ SOLN: 30000.0000 [IU] | INTRAMUSCULAR | Status: DC

## 2020-03-07 ENCOUNTER — Ambulatory Visit: Payer: Medicare Other | Admitting: Interventional Cardiology

## 2020-03-15 ENCOUNTER — Other Ambulatory Visit: Payer: Self-pay

## 2020-03-15 ENCOUNTER — Encounter (HOSPITAL_COMMUNITY)
Admission: RE | Admit: 2020-03-15 | Discharge: 2020-03-15 | Disposition: A | Discharge status: Home / Self Care | Payer: Medicare Other | Source: Ambulatory Visit | Attending: Internal Medicine | Admitting: Internal Medicine

## 2020-03-15 VITALS — BP 135/77 | HR 58 | Temp 97.7°F

## 2020-03-15 DIAGNOSIS — N185 Chronic kidney disease, stage 5: Secondary | ICD-10-CM | POA: Diagnosis present

## 2020-03-15 LAB — IRON AND TIBC
Iron: 76 ug/dL (ref 28–170)
Saturation Ratios: 27 % (ref 10.4–31.8)
TIBC: 281 ug/dL (ref 250–450)
UIBC: 205 ug/dL

## 2020-03-15 LAB — POCT HEMOGLOBIN-HEMACUE: Hemoglobin: 9.9 g/dL — ABNORMAL LOW (ref 12.0–15.0)

## 2020-03-15 LAB — FERRITIN: Ferritin: 568 ng/mL — ABNORMAL HIGH (ref 11–307)

## 2020-03-15 MED ORDER — EPOETIN ALFA-EPBX 40000 UNIT/ML IJ SOLN
30000.0000 [IU] | INTRAMUSCULAR | Status: DC
  Administered 2020-03-15: 30000 [IU] via SUBCUTANEOUS

## 2020-03-15 MED ORDER — EPOETIN ALFA-EPBX 40000 UNIT/ML IJ SOLN
INTRAMUSCULAR | Status: AC
  Filled 2020-03-15: qty 1

## 2020-03-29 ENCOUNTER — Other Ambulatory Visit: Payer: Self-pay

## 2020-03-29 ENCOUNTER — Encounter (HOSPITAL_COMMUNITY)
Admission: RE | Admit: 2020-03-29 | Discharge: 2020-03-29 | Disposition: A | Discharge status: Home / Self Care | Payer: Medicare Other | Source: Ambulatory Visit | Attending: Internal Medicine | Admitting: Internal Medicine

## 2020-03-29 VITALS — BP 148/64 | HR 57

## 2020-03-29 DIAGNOSIS — N185 Chronic kidney disease, stage 5: Secondary | ICD-10-CM

## 2020-03-29 LAB — RENAL FUNCTION PANEL
Albumin: 3.7 g/dL (ref 3.5–5.0)
Anion gap: 13 (ref 5–15)
BUN: 109 mg/dL — ABNORMAL HIGH (ref 8–23)
CO2: 20 mmol/L — ABNORMAL LOW (ref 22–32)
Calcium: 9.4 mg/dL (ref 8.9–10.3)
Chloride: 107 mmol/L (ref 98–111)
Creatinine, Ser: 3.91 mg/dL — ABNORMAL HIGH (ref 0.44–1.00)
GFR, Estimated: 12 mL/min — ABNORMAL LOW (ref >60)
Glucose, Bld: 158 mg/dL — ABNORMAL HIGH (ref 70–99)
Phosphorus: 5.5 mg/dL — ABNORMAL HIGH (ref 2.5–4.6)
Potassium: 4.7 mmol/L (ref 3.5–5.1)
Sodium: 140 mmol/L (ref 135–145)

## 2020-03-29 LAB — POCT HEMOGLOBIN-HEMACUE: Hemoglobin: 9.8 g/dL — ABNORMAL LOW (ref 12.0–15.0)

## 2020-03-29 MED ORDER — EPOETIN ALFA-EPBX 40000 UNIT/ML IJ SOLN: 30000.0000 [IU] | INTRAMUSCULAR | Status: DC

## 2020-03-29 MED ORDER — EPOETIN ALFA-EPBX 40000 UNIT/ML IJ SOLN
INTRAMUSCULAR | Status: AC
  Administered 2020-03-29: 30000 [IU] via SUBCUTANEOUS
  Filled 2020-03-29: qty 1

## 2020-03-30 LAB — PTH, INTACT AND CALCIUM
Calcium, Total (PTH): 9.4 mg/dL (ref 8.7–10.3)
PTH: 112 pg/mL — ABNORMAL HIGH (ref 15–65)

## 2020-04-12 ENCOUNTER — Other Ambulatory Visit: Payer: Self-pay

## 2020-04-12 ENCOUNTER — Encounter (HOSPITAL_COMMUNITY)
Admission: RE | Admit: 2020-04-12 | Discharge: 2020-04-12 | Disposition: A | Discharge status: Home / Self Care | Payer: Medicare Other | Source: Ambulatory Visit | Attending: Internal Medicine | Admitting: Internal Medicine

## 2020-04-12 VITALS — BP 139/62 | HR 63 | Temp 97.7°F | Resp 18

## 2020-04-12 DIAGNOSIS — D631 Anemia in chronic kidney disease: Secondary | ICD-10-CM | POA: Insufficient documentation

## 2020-04-12 DIAGNOSIS — N185 Chronic kidney disease, stage 5: Secondary | ICD-10-CM | POA: Diagnosis present

## 2020-04-12 LAB — IRON AND TIBC
Iron: 89 ug/dL (ref 28–170)
Saturation Ratios: 31 % (ref 10.4–31.8)
TIBC: 288 ug/dL (ref 250–450)
UIBC: 199 ug/dL

## 2020-04-12 LAB — POCT HEMOGLOBIN-HEMACUE: Hemoglobin: 9.4 g/dL — ABNORMAL LOW (ref 12.0–15.0)

## 2020-04-12 LAB — FERRITIN: Ferritin: 656 ng/mL — ABNORMAL HIGH (ref 11–307)

## 2020-04-12 MED ORDER — EPOETIN ALFA-EPBX 40000 UNIT/ML IJ SOLN
INTRAMUSCULAR | Status: AC
  Administered 2020-04-12: 35000 [IU] via SUBCUTANEOUS
  Filled 2020-04-12: qty 1

## 2020-04-12 MED ORDER — EPOETIN ALFA-EPBX 40000 UNIT/ML IJ SOLN: 35000.0000 [IU] | INTRAMUSCULAR | Status: DC

## 2020-04-17 ENCOUNTER — Other Ambulatory Visit: Payer: Self-pay

## 2020-04-17 ENCOUNTER — Emergency Department (HOSPITAL_COMMUNITY): Payer: Medicare Other

## 2020-04-17 ENCOUNTER — Emergency Department (HOSPITAL_COMMUNITY)
Admission: EM | Admit: 2020-04-17 | Discharge: 2020-04-17 | Disposition: A | Discharge status: Home / Self Care | Payer: Medicare Other | Attending: Emergency Medicine | Admitting: Emergency Medicine

## 2020-04-17 ENCOUNTER — Encounter (HOSPITAL_COMMUNITY): Payer: Self-pay | Admitting: Emergency Medicine

## 2020-04-17 DIAGNOSIS — I132 Hypertensive heart and chronic kidney disease with heart failure and with stage 5 chronic kidney disease, or end stage renal disease: Secondary | ICD-10-CM | POA: Insufficient documentation

## 2020-04-17 DIAGNOSIS — Z79899 Other long term (current) drug therapy: Secondary | ICD-10-CM | POA: Diagnosis not present

## 2020-04-17 DIAGNOSIS — E1122 Type 2 diabetes mellitus with diabetic chronic kidney disease: Secondary | ICD-10-CM | POA: Insufficient documentation

## 2020-04-17 DIAGNOSIS — R531 Weakness: Secondary | ICD-10-CM | POA: Diagnosis not present

## 2020-04-17 DIAGNOSIS — Z20822 Contact with and (suspected) exposure to covid-19: Secondary | ICD-10-CM | POA: Diagnosis not present

## 2020-04-17 DIAGNOSIS — E039 Hypothyroidism, unspecified: Secondary | ICD-10-CM | POA: Insufficient documentation

## 2020-04-17 DIAGNOSIS — Z794 Long term (current) use of insulin: Secondary | ICD-10-CM | POA: Insufficient documentation

## 2020-04-17 DIAGNOSIS — R0602 Shortness of breath: Secondary | ICD-10-CM | POA: Insufficient documentation

## 2020-04-17 DIAGNOSIS — I5032 Chronic diastolic (congestive) heart failure: Secondary | ICD-10-CM | POA: Diagnosis not present

## 2020-04-17 DIAGNOSIS — N185 Chronic kidney disease, stage 5: Secondary | ICD-10-CM | POA: Diagnosis not present

## 2020-04-17 LAB — COMPREHENSIVE METABOLIC PANEL
ALT: 39 U/L (ref 0–44)
AST: 42 U/L — ABNORMAL HIGH (ref 15–41)
Albumin: 3.6 g/dL (ref 3.5–5.0)
Alkaline Phosphatase: 108 U/L (ref 38–126)
Anion gap: 13 (ref 5–15)
BUN: 131 mg/dL — ABNORMAL HIGH (ref 8–23)
CO2: 19 mmol/L — ABNORMAL LOW (ref 22–32)
Calcium: 9.6 mg/dL (ref 8.9–10.3)
Chloride: 108 mmol/L (ref 98–111)
Creatinine, Ser: 4.32 mg/dL — ABNORMAL HIGH (ref 0.44–1.00)
GFR, Estimated: 11 mL/min — ABNORMAL LOW (ref >60)
Glucose, Bld: 122 mg/dL — ABNORMAL HIGH (ref 70–99)
Potassium: 4.7 mmol/L (ref 3.5–5.1)
Sodium: 140 mmol/L (ref 135–145)
Total Bilirubin: 0.9 mg/dL (ref 0.3–1.2)
Total Protein: 6.8 g/dL (ref 6.5–8.1)

## 2020-04-17 LAB — CBC WITH DIFFERENTIAL/PLATELET
Abs Immature Granulocytes: 0.04 10*3/uL (ref 0.00–0.07)
Basophils Absolute: 0 10*3/uL (ref 0.0–0.1)
Basophils Relative: 0 %
Eosinophils Absolute: 0.1 10*3/uL (ref 0.0–0.5)
Eosinophils Relative: 2 %
HCT: 29.5 % — ABNORMAL LOW (ref 36.0–46.0)
Hemoglobin: 9.2 g/dL — ABNORMAL LOW (ref 12.0–15.0)
Immature Granulocytes: 1 %
Lymphocytes Relative: 20 %
Lymphs Abs: 1 10*3/uL (ref 0.7–4.0)
MCH: 27 pg (ref 26.0–34.0)
MCHC: 31.2 g/dL (ref 30.0–36.0)
MCV: 86.5 fL (ref 80.0–100.0)
Monocytes Absolute: 0.6 10*3/uL (ref 0.1–1.0)
Monocytes Relative: 12 %
Neutro Abs: 3.3 10*3/uL (ref 1.7–7.7)
Neutrophils Relative %: 65 %
Platelets: 117 10*3/uL — ABNORMAL LOW (ref 150–400)
RBC: 3.41 MIL/uL — ABNORMAL LOW (ref 3.87–5.11)
RDW: 20.3 % — ABNORMAL HIGH (ref 11.5–15.5)
WBC: 5.1 10*3/uL (ref 4.0–10.5)
nRBC: 0.4 % — ABNORMAL HIGH (ref 0.0–0.2)

## 2020-04-17 LAB — TSH: TSH: 4.992 u[IU]/mL — ABNORMAL HIGH (ref 0.350–4.500)

## 2020-04-17 LAB — URINALYSIS, ROUTINE W REFLEX MICROSCOPIC
Bilirubin Urine: NEGATIVE
Glucose, UA: NEGATIVE mg/dL
Hgb urine dipstick: NEGATIVE
Ketones, ur: NEGATIVE mg/dL
Leukocytes,Ua: NEGATIVE
Nitrite: NEGATIVE
Protein, ur: 100 mg/dL — AB
Specific Gravity, Urine: 1.013 (ref 1.005–1.030)
pH: 5 (ref 5.0–8.0)

## 2020-04-17 LAB — BRAIN NATRIURETIC PEPTIDE: B Natriuretic Peptide: 508 pg/mL — ABNORMAL HIGH (ref 0.0–100.0)

## 2020-04-17 LAB — RESP PANEL BY RT-PCR (FLU A&B, COVID) ARPGX2
Influenza A by PCR: NEGATIVE
Influenza B by PCR: NEGATIVE
SARS Coronavirus 2 by RT PCR: NEGATIVE

## 2020-04-17 MED ORDER — FUROSEMIDE 10 MG/ML IJ SOLN
40.0000 mg | Freq: Once | INTRAMUSCULAR | Status: AC
  Administered 2020-04-17: 40 mg via INTRAVENOUS
  Filled 2020-04-17: qty 4

## 2020-04-17 NOTE — ED Notes (Signed)
Patient transported to x-ray. ?

## 2020-04-17 NOTE — ED Triage Notes (Signed)
Patient coming from home. Complaint of shortness of breath and generalized weakness for 1 week. Patient states it has been constant.

## 2020-04-17 NOTE — ED Provider Notes (Signed)
[facility] EMERGENCY DEPARTMENT Provider Note   CSN: [id number] Arrival date & time: 04/17/20  0846     History Chief Complaint  Patient presents with  . Shortness of Breath    Tiffany [name] is a 70 y.o. female.  70 y.o female with a PMH of CKD not on dialysis, DM, CHF, hypothyroid, HTN presents to the ED with a chief complaint of shortness of breath and weakness for the past week.  Patient was seen at Dr. Caprice Red office this morning, referred to the ED for further evaluation.  She reports for the past couple weeks she has been more short of breath than usual, states is exacerbated with exertion, states that she feels " cannot get anywhere ", without feeling winded and weak.  She has been using her inhaler, uses these 3 times a day.  Also reports an increase in inhaler nebulizer use without much improvement.  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  She was told she needed to limit her water intake due to her CKD, is only supposed to have 16 ounces daily.  Feels like she is overall weak and fatigued, without any focal deficit.  Also reports lack of appetite, has only eaten 1 meal yesterday.  Describes a pain to her head which she does not describe as a headache that comes and goes without relief.  She also endorses a dry cough but no leg swelling above her baseline.  No fever, chest pain, nausea, vomiting.   The history is provided by the patient.       Past Medical History:  Diagnosis Date  . ALLERGIC RHINITIS   . Arthritis   . Chronic diastolic heart failure ([place]) 05/05/2013   Echo 03/2018:  EF 01-74, +diastolic dysfunction, GLS -20.5%, normal RVSF, RVSP 42.5 (mild elevated), severe LAE, small pericardial eff, mild MAc, mod AV calc, mild PI  . CKD (chronic kidney disease)   . Diabetes mellitus   . Heart murmur    at birth  . Hyperlipidemia   . Hypertension   . Hypothyroid   . Iron deficiency anemia   . Obesity   . OSA  on CPAP   . Previous back surgery    x 2  . Refusal of blood transfusions as patient is Jehovah's Witness   . S/P arthroscopy     Patient Active Problem List   Diagnosis Date Noted  . Acute CHF (congestive heart failure) ([place]) 02/01/2020  . S/P left TKA 07/01/2019  . Status post total left knee replacement 07/01/2019  . Hyperlipidemia 09/12/2016  . Type 2 diabetes mellitus without complications ([place]) 94/49/6759  . Tachycardia 08/26/2016  . Palpitation 08/26/2016  . SOB (shortness of breath) 08/26/2016  . Chronic kidney disease, stage V ([place]) 04/28/2014  . Chronic diastolic heart failure ([place]) 05/05/2013  . Essential hypertension 05/05/2013  . HEART MURMUR, SYSTOLIC 16/38/4665  . UNSPECIFIED HYPOTHYROIDISM 02/12/2007  . OBESITY 01/08/2007  . Obstructive sleep apnea 01/08/2007  . Seasonal and perennial allergic rhinitis 01/08/2007    Past Surgical History:  Procedure Laterality Date  . BREAST BIOPSY  20+ yrs ago   dont know which breast per pt; benign  . BREAST REDUCTION SURGERY  1982  . KNEE SURGERY     x 2  . LUMBAR DISC SURGERY     x 2  . REDUCTION MAMMAPLASTY Bilateral 1985  . TOE SURGERY    . TOTAL ABDOMINAL HYSTERECTOMY  1997  partial hysterectomy- still has ovaries  . TOTAL KNEE ARTHROPLASTY Left 07/01/2019   Procedure: TOTAL KNEE ARTHROPLASTY;  Surgeon: Paralee Cancel, MD;  Location: [facility] ORS;  Service: Orthopedics;  Laterality: Left;  70 min NO BLOOD PRODUCTS!!  . TREATMENT FISTULA ANAL       OB History   No obstetric history on file.     Family History  Problem Relation Age of Onset  . Prostate cancer Father   . Congestive Heart Failure Mother   . Diabetes Sister   . Other Sister        septis  . Breast cancer Neg Hx     Social History   Tobacco Use  . Smoking status: Never Smoker  . Smokeless tobacco: Never Used  Vaping Use  . Vaping Use: Never used  Substance Use Topics  . Alcohol use: Yes    Alcohol/week: 0.0 standard drinks    Comment:  rare  . Drug use: No    Home Medications Prior to Admission medications   Medication Sig Start Date End Date Taking? Authorizing Provider  acetaminophen (TYLENOL) 650 MG CR tablet Take 650 mg by mouth every 8 (eight) hours as needed for pain.    [provider]  albuterol (VENTOLIN HFA) 108 (90 Base) MCG/ACT inhaler Inhale 1-2 puffs into the lungs every 6 (six) hours as needed for wheezing or shortness of breath.    [provider]  allopurinol (ZYLOPRIM) 100 MG tablet Take 100 mg by mouth 2 (two) times daily. 07/30/16   [provider]  amLODipine (NORVASC) 10 MG tablet Take 10 mg by mouth 2 (two) times daily.     [provider]  B Complex Vitamins (B COMPLEX PO) Take 1 tablet by mouth daily.    [provider]  B-D INS SYRINGE 0.5CC/30GX1/2" 30G X 1/2" 0.5 ML MISC TAKE AS DIRECTED TWICE A DAY 08/12/15   [provider]  BIOTIN 5000 PO Take 5,000 mcg by mouth daily.     [provider]  calcitRIOL (ROCALTROL) 0.25 MCG capsule Take 0.5 mcg by mouth daily at 6 PM. (1700)    [provider]  carvedilol (COREG) 6.25 MG tablet Take 6.25 mg by mouth in the morning and at bedtime.    [provider]  cetirizine (ZYRTEC) 10 MG tablet Take 10 mg by mouth daily as needed for allergies.    [provider]  cloNIDine (CATAPRES - DOSED IN MG/24 HR) 0.3 mg/24hr Place 0.3 mg onto the skin every Friday.     [provider]  Coenzyme Q10 100 MG capsule Take 100 mg by mouth daily.    [provider]  hydrALAZINE (APRESOLINE) 25 MG tablet Take 25 mg by mouth 3 (three) times daily.    [provider]  insulin glargine (LANTUS) 100 UNIT/ML injection Inject 18 Units into the skin at bedtime.     [provider]  isosorbide mononitrate (IMDUR) 30 MG 24 hr tablet Take 1 tablet (30 mg total) by mouth daily. 01/25/20 04/24/20  Burtis Junes, NP  levothyroxine (SYNTHROID) 112 MCG tablet Take  224-336 mcg by mouth See admin instructions. Take 3 tablets (336 mcg) by mouth on Sundays at 2200 & take 2 tablets (224 mcg) by mouth on Mondays, Tuesdays, Wednesdays, Thursdays, Fridays, & Saturdays at 2200.    [provider]  Multiple Vitamins-Minerals (PRESERVISION AREDS PO) Take 1 tablet by mouth in the morning and at bedtime.    [provider]  Baruch Gouty  Take 1 tablet by mouth daily. Renadyl for kidneys    [provider]  Omega-3 Fatty Acids (FISH OIL) 500 MG CAPS Take 500 mg by mouth daily in the afternoon.    [provider]  polyethylene glycol (MIRALAX / GLYCOLAX) 17 g packet Take 17 g by mouth 2 (two) times daily. Patient not taking: Reported on 02/01/2020 07/02/19   Maurice March, PA-C  rosuvastatin (CRESTOR) 10 MG tablet Take 10 mg by mouth every evening.    [provider]  SYSTANE COMPLETE 0.6 % SOLN Place 1 drop into both eyes in the morning, at noon, in the evening, and at bedtime.    [provider]  torsemide (DEMADEX) 100 MG tablet Take 100-150 mg by mouth See admin instructions. Alternating between 1 tablet (100 mg) & 1.5 tablets (150 mg) every other day    [provider]  TURMERIC PO Take 1 tablet by mouth daily.    [provider]  zolpidem (AMBIEN) 5 MG tablet 1 at bedtime as needed for sleep Patient taking differently: Take 5 mg by mouth at bedtime as needed for sleep. 1 at bedtime as needed for sleep 10/26/19   Deneise Lever, MD    Allergies    Patient has no known allergies.  Review of Systems   Review of Systems  Constitutional: Negative for fever.  HENT: Negative for sore throat.   Respiratory: Positive for shortness of breath.   Cardiovascular: Negative for chest pain.  Gastrointestinal: Negative for abdominal pain, nausea and vomiting.  Genitourinary: Positive for frequency. Negative for flank pain.  Musculoskeletal: Negative for back pain.  Skin: Negative for pallor and wound.   Neurological: Positive for weakness. Negative for light-headedness and headaches.  All other systems reviewed and are negative.   Physical Exam Updated Vital Signs BP (!) 144/64   Pulse (!) 54   Temp (!) 97.5 F (36.4 C) (Oral)   Resp 16   Ht _0  (1.778 m)   Wt 114 kg   SpO2 100%   BMI 36.06 kg/m   Physical Exam Vitals and nursing note reviewed.  Constitutional:      General: She is not in acute distress.    Appearance: She is well-developed.  HENT:     Head: Normocephalic and atraumatic.     Mouth/Throat:     Pharynx: No oropharyngeal exudate.  Eyes:     Pupils: Pupils are equal, round, and reactive to light.  Cardiovascular:     Rate and Rhythm: Regular rhythm.     Heart sounds: Normal heart sounds.  Pulmonary:     Effort: Pulmonary effort is normal. No respiratory distress.     Breath sounds: Normal breath sounds. No decreased breath sounds or wheezing.  Chest:     Chest wall: No tenderness.  Abdominal:     General: Bowel sounds are normal. There is no distension.     Palpations: Abdomen is soft.     Tenderness: There is no abdominal tenderness.  Musculoskeletal:        General: No tenderness or deformity.     Cervical back: Normal range of motion.     Right lower leg: No tenderness. No edema.     Left lower leg: No tenderness. No edema.  Skin:    General: Skin is warm and dry.  Neurological:     Mental Status: She is alert and oriented to person, place, and time.     ED Results / Procedures / Treatments  Labs (all labs ordered are listed, but only abnormal results are displayed) Labs Reviewed  CBC WITH DIFFERENTIAL/PLATELET - Abnormal; Notable for the following components:      Result Value   RBC 3.41 (*)    Hemoglobin 9.2 (*)    HCT 29.5 (*)    RDW 20.3 (*)    Platelets 117 (*)    nRBC 0.4 (*)    All other components within normal limits  COMPREHENSIVE METABOLIC PANEL - Abnormal; Notable for the following components:   CO2 19 (*)     Glucose, Bld 122 (*)    BUN 131 (*)    Creatinine, Ser 4.32 (*)    AST 42 (*)    GFR, Estimated 11 (*)    All other components within normal limits  BRAIN NATRIURETIC PEPTIDE - Abnormal; Notable for the following components:   B Natriuretic Peptide 508.0 (*)    All other components within normal limits  TSH - Abnormal; Notable for the following components:   TSH 4.992 (*)    All other components within normal limits  URINALYSIS, ROUTINE W REFLEX MICROSCOPIC - Abnormal; Notable for the following components:   Protein, ur 100 (*)    Bacteria, UA RARE (*)    All other components within normal limits  RESP PANEL BY RT-PCR (FLU A&B, COVID) [id number]    EKG EKG Interpretation  Date/Time:  Monday April 17 2020 08:55:43 EDT Ventricular Rate:  59 PR Interval:    QRS Duration: 100 QT Interval:  468 QTC Calculation: 464 R Axis:   62 Text Interpretation: Sinus rhythm Atrial premature complexes Short PR interval Left atrial enlargement Borderline low voltage, extremity leads Probable anterolateral infarct, old Confirmed by Thamas Jaegers ([id number]) on 04/17/2020 12:46:44 PM   Radiology DG Chest 2 View  Result Date: 04/17/2020 CLINICAL DATA:  Shortness of breath and congestion for a week. EXAM: CHEST - 2 VIEW COMPARISON:  02/01/2020. FINDINGS: Trachea is midline. Heart is enlarged. Atherosclerotic calcification of the aorta. Mild diffuse mixed interstitial prominence and indistinctness. No pleural fluid. IMPRESSION: Pulmonary edema. Electronically Signed   By: Lorin Picket M.D.   On: 04/17/2020 09:40    Procedures Procedures   Medications Ordered in ED Medications  furosemide (LASIX) injection 40 mg (40 mg Intravenous Given 04/17/20 1351)    ED Course  I have reviewed the triage vital signs and the nursing notes.  Pertinent labs & imaging results that were available during my care of the patient were reviewed by me and considered in my medical decision making (see chart for  details).  Clinical Course as of 04/17/20 1434  Mon Apr 17, 2020  1159 TSH(!): 4.992 [JS]    Clinical Course User Index [JS] Janeece Fitting, PA-C   MDM Rules/Calculators/A&P  Patient with a past medical history of CKD, HTN, CHF presents to the ED with chief complaint of overall weakness and shortness of breath, symptoms have been ongoing for the past week.  She is currently a bus driver for a school, driving 7+ hours a day, no prior history of blood clots.  Does endorse bilateral leg swelling which is consistent with her CHF and not abnormal from her baseline.  She was seen by Dr. Johnney Ou this morning referred to the ED.  During primary evaluation patient is overall nontoxic, non-ill-appearing, lungs are clear to auscultation, there is no pain with palpation of her chest wall.  She was laid flat for abdominal exam, orthopnea was present, where she feels that she cannot catch her  breath.  Her oxygen saturations 100% on room air, has not had any fevers, arrives afebrile.  Some suspicion for fluid overload.  He is also limiting her water intake due to her kidney disease, advised to only take 16 ounces of fluid daily.  Mild bilateraL edema, consistent with her baseline per patient.  No pain with palpation of bilateral calves.  Interpretation of her labs revealed a CBC without any leukocytosis, her hemoglobin is decreased but consistent with her prior visits.  BNP slightly elevated at 500, we discussed IV diuresis on today's visit which she is agreeable.  CMP without any electrolyte abnormalities, creatinine level is slightly up but within her baseline.  UA with protein, rare bacteria.  She has a negative Covid test, x-ray is remarkable for pulmonary edema.  Vitals have been stable throughout her visit, she is satting at 100% on room air, was able to ambulate to the bathroom without any decrease in her O2 sat.  We discussed appropriate follow-up with Dr. Johnney Ou, she will see 40 mg of IV Lasix in order to  help with fluid.  We did discuss being her torsemide to 150 which she was told to increase when she feels like she is more swollen.  Patient received IV Lasix, reports improvement symptoms.  Return precautions discussed at length.  Portions of this note were generated with Lobbyist. Dictation errors may occur despite best attempts at proofreading.  Final Clinical Impression(s) / ED Diagnoses Final diagnoses:  SOB (shortness of breath)    Rx / DC Orders ED Discharge Orders    None       Janeece Fitting, PA-C 04/17/20 [place], MD 04/19/20 [phone]

## 2020-04-17 NOTE — Discharge Instructions (Addendum)
We discussed the results of your labs along with x-ray.  Please follow-up with your primary care physician early next week if your symptoms do not improve.

## 2020-04-23 ENCOUNTER — Telehealth: Payer: Self-pay | Admitting: Physician Assistant

## 2020-04-23 NOTE — Telephone Encounter (Signed)
70 y.o. female with (HFpEF) heart failure with preserved ejection fraction, chronic kidney disease.  She was just in the ED on 3/14.  CXR showed pulmonary edema.  She was given a dose of IV Lasix 40 mg x 1 and increased her Torsemide from 100 to 150 mg once daily.  She remains significantly short of breath with assoc chest heaviness.  She also notes orthopnea. She is failing increased oral diuretics at home and likely needs IV diuresis. PLAN: I have advised her to go to the [facility] ED.  She verbalized understanding. Richardson Dopp, PA-C    04/23/2020 12:33 PM

## 2020-04-25 NOTE — Telephone Encounter (Signed)
Thanks [employer]. I have not seen her recently.

## 2020-04-26 ENCOUNTER — Other Ambulatory Visit: Payer: Self-pay

## 2020-04-26 ENCOUNTER — Inpatient Hospital Stay (HOSPITAL_COMMUNITY)
Admission: EM | Admit: 2020-04-26 | Discharge: 2020-05-02 | DRG: 291 | Disposition: A | Discharge status: Home / Self Care | Payer: Medicare Other | Attending: Internal Medicine | Admitting: Internal Medicine

## 2020-04-26 ENCOUNTER — Emergency Department (HOSPITAL_COMMUNITY): Payer: Medicare Other

## 2020-04-26 ENCOUNTER — Encounter (HOSPITAL_COMMUNITY): Payer: Self-pay

## 2020-04-26 ENCOUNTER — Encounter (HOSPITAL_COMMUNITY)
Admission: RE | Admit: 2020-04-26 | Discharge: 2020-04-26 | Disposition: A | Discharge status: Home / Self Care | Payer: Medicare Other | Source: Ambulatory Visit | Attending: Internal Medicine | Admitting: Internal Medicine

## 2020-04-26 VITALS — BP 141/69 | HR 56 | Temp 97.5°F | Resp 18

## 2020-04-26 DIAGNOSIS — N185 Chronic kidney disease, stage 5: Secondary | ICD-10-CM | POA: Diagnosis present

## 2020-04-26 DIAGNOSIS — I132 Hypertensive heart and chronic kidney disease with heart failure and with stage 5 chronic kidney disease, or end stage renal disease: Principal | ICD-10-CM | POA: Diagnosis present

## 2020-04-26 DIAGNOSIS — Z79890 Hormone replacement therapy (postmenopausal): Secondary | ICD-10-CM | POA: Diagnosis not present

## 2020-04-26 DIAGNOSIS — Z794 Long term (current) use of insulin: Secondary | ICD-10-CM

## 2020-04-26 DIAGNOSIS — Z8249 Family history of ischemic heart disease and other diseases of the circulatory system: Secondary | ICD-10-CM

## 2020-04-26 DIAGNOSIS — I10 Essential (primary) hypertension: Secondary | ICD-10-CM | POA: Diagnosis not present

## 2020-04-26 DIAGNOSIS — Z96652 Presence of left artificial knee joint: Secondary | ICD-10-CM | POA: Diagnosis present

## 2020-04-26 DIAGNOSIS — Z66 Do not resuscitate: Secondary | ICD-10-CM | POA: Diagnosis present

## 2020-04-26 DIAGNOSIS — E1122 Type 2 diabetes mellitus with diabetic chronic kidney disease: Secondary | ICD-10-CM | POA: Diagnosis present

## 2020-04-26 DIAGNOSIS — Z20822 Contact with and (suspected) exposure to covid-19: Secondary | ICD-10-CM | POA: Diagnosis present

## 2020-04-26 DIAGNOSIS — Z79899 Other long term (current) drug therapy: Secondary | ICD-10-CM | POA: Diagnosis not present

## 2020-04-26 DIAGNOSIS — R946 Abnormal results of thyroid function studies: Secondary | ICD-10-CM | POA: Diagnosis present

## 2020-04-26 DIAGNOSIS — R001 Bradycardia, unspecified: Secondary | ICD-10-CM | POA: Diagnosis present

## 2020-04-26 DIAGNOSIS — Z531 Procedure and treatment not carried out because of patient's decision for reasons of belief and group pressure: Secondary | ICD-10-CM | POA: Diagnosis present

## 2020-04-26 DIAGNOSIS — I248 Other forms of acute ischemic heart disease: Secondary | ICD-10-CM | POA: Diagnosis present

## 2020-04-26 DIAGNOSIS — D649 Anemia, unspecified: Secondary | ICD-10-CM

## 2020-04-26 DIAGNOSIS — R059 Cough, unspecified: Secondary | ICD-10-CM | POA: Diagnosis not present

## 2020-04-26 DIAGNOSIS — E1165 Type 2 diabetes mellitus with hyperglycemia: Secondary | ICD-10-CM | POA: Diagnosis not present

## 2020-04-26 DIAGNOSIS — E669 Obesity, unspecified: Secondary | ICD-10-CM | POA: Diagnosis present

## 2020-04-26 DIAGNOSIS — D631 Anemia in chronic kidney disease: Secondary | ICD-10-CM | POA: Diagnosis present

## 2020-04-26 DIAGNOSIS — Z6835 Body mass index (BMI) 35.0-35.9, adult: Secondary | ICD-10-CM | POA: Diagnosis not present

## 2020-04-26 DIAGNOSIS — N2581 Secondary hyperparathyroidism of renal origin: Secondary | ICD-10-CM | POA: Diagnosis present

## 2020-04-26 DIAGNOSIS — E039 Hypothyroidism, unspecified: Secondary | ICD-10-CM | POA: Diagnosis present

## 2020-04-26 DIAGNOSIS — E785 Hyperlipidemia, unspecified: Secondary | ICD-10-CM | POA: Diagnosis present

## 2020-04-26 DIAGNOSIS — R0902 Hypoxemia: Secondary | ICD-10-CM | POA: Diagnosis present

## 2020-04-26 DIAGNOSIS — Z833 Family history of diabetes mellitus: Secondary | ICD-10-CM | POA: Diagnosis not present

## 2020-04-26 DIAGNOSIS — E8889 Other specified metabolic disorders: Secondary | ICD-10-CM | POA: Diagnosis present

## 2020-04-26 DIAGNOSIS — I313 Pericardial effusion (noninflammatory): Secondary | ICD-10-CM | POA: Diagnosis present

## 2020-04-26 DIAGNOSIS — G4733 Obstructive sleep apnea (adult) (pediatric): Secondary | ICD-10-CM | POA: Diagnosis present

## 2020-04-26 DIAGNOSIS — Z8042 Family history of malignant neoplasm of prostate: Secondary | ICD-10-CM

## 2020-04-26 DIAGNOSIS — Z90710 Acquired absence of both cervix and uterus: Secondary | ICD-10-CM

## 2020-04-26 DIAGNOSIS — N179 Acute kidney failure, unspecified: Secondary | ICD-10-CM | POA: Diagnosis present

## 2020-04-26 DIAGNOSIS — I5033 Acute on chronic diastolic (congestive) heart failure: Secondary | ICD-10-CM | POA: Diagnosis present

## 2020-04-26 DIAGNOSIS — I509 Heart failure, unspecified: Secondary | ICD-10-CM

## 2020-04-26 HISTORY — DX: Dyspnea, unspecified: R06.00

## 2020-04-26 HISTORY — DX: Heart failure, unspecified: I50.9

## 2020-04-26 LAB — BASIC METABOLIC PANEL
Anion gap: 12 (ref 5–15)
BUN: 131 mg/dL — ABNORMAL HIGH (ref 8–23)
CO2: 21 mmol/L — ABNORMAL LOW (ref 22–32)
Calcium: 9.6 mg/dL (ref 8.9–10.3)
Chloride: 107 mmol/L (ref 98–111)
Creatinine, Ser: 4.1 mg/dL — ABNORMAL HIGH (ref 0.44–1.00)
GFR, Estimated: 11 mL/min — ABNORMAL LOW (ref >60)
Glucose, Bld: 171 mg/dL — ABNORMAL HIGH (ref 70–99)
Potassium: 4.6 mmol/L (ref 3.5–5.1)
Sodium: 140 mmol/L (ref 135–145)

## 2020-04-26 LAB — HEPATIC FUNCTION PANEL
ALT: 29 U/L (ref 0–44)
AST: 27 U/L (ref 15–41)
Albumin: 3.5 g/dL (ref 3.5–5.0)
Alkaline Phosphatase: 112 U/L (ref 38–126)
Bilirubin, Direct: <0.1 mg/dL (ref 0.0–0.2)
Total Bilirubin: 0.5 mg/dL (ref 0.3–1.2)
Total Protein: 6.4 g/dL — ABNORMAL LOW (ref 6.5–8.1)

## 2020-04-26 LAB — RESP PANEL BY RT-PCR (FLU A&B, COVID) ARPGX2
Influenza A by PCR: NEGATIVE
Influenza B by PCR: NEGATIVE
SARS Coronavirus 2 by RT PCR: NEGATIVE

## 2020-04-26 LAB — CBC
HCT: 30.6 % — ABNORMAL LOW (ref 36.0–46.0)
Hemoglobin: 9.5 g/dL — ABNORMAL LOW (ref 12.0–15.0)
MCH: 26.7 pg (ref 26.0–34.0)
MCHC: 31 g/dL (ref 30.0–36.0)
MCV: 86 fL (ref 80.0–100.0)
Platelets: 111 10*3/uL — ABNORMAL LOW (ref 150–400)
RBC: 3.56 MIL/uL — ABNORMAL LOW (ref 3.87–5.11)
RDW: 19.9 % — ABNORMAL HIGH (ref 11.5–15.5)
WBC: 4 10*3/uL (ref 4.0–10.5)
nRBC: 0 % (ref 0.0–0.2)

## 2020-04-26 LAB — HEMOGLOBIN A1C
Hgb A1c MFr Bld: 6.3 % — ABNORMAL HIGH (ref 4.8–5.6)
Mean Plasma Glucose: 134.11 mg/dL

## 2020-04-26 LAB — TROPONIN I (HIGH SENSITIVITY)
Troponin I (High Sensitivity): 99 ng/L — ABNORMAL HIGH (ref <18)
Troponin I (High Sensitivity): 80 ng/L — ABNORMAL HIGH (ref <18)

## 2020-04-26 LAB — POCT HEMOGLOBIN-HEMACUE: Hemoglobin: 10.2 g/dL — ABNORMAL LOW (ref 12.0–15.0)

## 2020-04-26 LAB — BRAIN NATRIURETIC PEPTIDE: B Natriuretic Peptide: 322.2 pg/mL — ABNORMAL HIGH (ref 0.0–100.0)

## 2020-04-26 LAB — TSH: TSH: 10.113 u[IU]/mL — ABNORMAL HIGH (ref 0.350–4.500)

## 2020-04-26 LAB — CBG MONITORING, ED: Glucose-Capillary: 139 mg/dL — ABNORMAL HIGH (ref 70–99)

## 2020-04-26 MED ORDER — LEVOTHYROXINE SODIUM 112 MCG PO TABS: 224.0000 ug | ORAL_TABLET | ORAL | Status: DC

## 2020-04-26 MED ORDER — POLYETHYLENE GLYCOL 3350 17 G PO PACK
17.0000 g | PACK | Freq: Every day | ORAL | Status: DC | PRN
  Administered 2020-04-29: 17 g via ORAL
  Filled 2020-04-26: qty 1

## 2020-04-26 MED ORDER — EPOETIN ALFA-EPBX 40000 UNIT/ML IJ SOLN
35000.0000 [IU] | INTRAMUSCULAR | Status: DC
  Administered 2020-04-26: 35000 [IU] via SUBCUTANEOUS

## 2020-04-26 MED ORDER — CALCITRIOL 0.5 MCG PO CAPS
0.5000 ug | ORAL_CAPSULE | Freq: Every day | ORAL | Status: DC
  Administered 2020-04-27 – 2020-05-02 (×6): 0.5 ug via ORAL
  Filled 2020-04-26 (×6): qty 1

## 2020-04-26 MED ORDER — INSULIN ASPART 100 UNIT/ML ~~LOC~~ SOLN
0.0000 [IU] | Freq: Every day | SUBCUTANEOUS | Status: DC
  Administered 2020-04-30 – 2020-05-01 (×2): 2 [IU] via SUBCUTANEOUS

## 2020-04-26 MED ORDER — CARVEDILOL 6.25 MG PO TABS
6.2500 mg | ORAL_TABLET | Freq: Two times a day (BID) | ORAL | Status: DC
  Administered 2020-04-27 – 2020-05-02 (×11): 6.25 mg via ORAL
  Filled 2020-04-26 (×11): qty 1

## 2020-04-26 MED ORDER — EPOETIN ALFA-EPBX 40000 UNIT/ML IJ SOLN
INTRAMUSCULAR | Status: AC
  Filled 2020-04-26: qty 1

## 2020-04-26 MED ORDER — ISOSORBIDE MONONITRATE ER 30 MG PO TB24
30.0000 mg | ORAL_TABLET | Freq: Every day | ORAL | Status: DC
Start: 2020-04-27 — End: 2020-05-02
  Administered 2020-04-27 – 2020-05-02 (×6): 30 mg via ORAL
  Filled 2020-04-26 (×6): qty 1

## 2020-04-26 MED ORDER — AMLODIPINE BESYLATE 10 MG PO TABS
10.0000 mg | ORAL_TABLET | Freq: Two times a day (BID) | ORAL | Status: DC
Start: 2020-04-26 — End: 2020-05-02
  Administered 2020-04-27 – 2020-05-02 (×12): 10 mg via ORAL
  Filled 2020-04-26 (×4): qty 1
  Filled 2020-04-26: qty 2
  Filled 2020-04-26 (×5): qty 1
  Filled 2020-04-26: qty 2
  Filled 2020-04-26: qty 1

## 2020-04-26 MED ORDER — CLONIDINE HCL 0.3 MG/24HR TD PTWK
0.3000 mg | MEDICATED_PATCH | TRANSDERMAL | Status: DC
  Administered 2020-04-28: 0.3 mg via TRANSDERMAL
  Filled 2020-04-26: qty 1

## 2020-04-26 MED ORDER — FUROSEMIDE 10 MG/ML IJ SOLN
40.0000 mg | Freq: Two times a day (BID) | INTRAMUSCULAR | Status: DC
  Administered 2020-04-27: 40 mg via INTRAVENOUS
  Filled 2020-04-26: qty 4

## 2020-04-26 MED ORDER — ROSUVASTATIN CALCIUM 5 MG PO TABS
10.0000 mg | ORAL_TABLET | Freq: Every evening | ORAL | Status: DC
  Administered 2020-04-27 – 2020-05-01 (×5): 10 mg via ORAL
  Filled 2020-04-26 (×5): qty 2

## 2020-04-26 MED ORDER — LEVOTHYROXINE SODIUM 112 MCG PO TABS: 336.0000 ug | ORAL_TABLET | ORAL | Status: DC

## 2020-04-26 MED ORDER — ZOLPIDEM TARTRATE 5 MG PO TABS
5.0000 mg | ORAL_TABLET | Freq: Every evening | ORAL | Status: DC | PRN
  Administered 2020-04-28 – 2020-05-01 (×4): 5 mg via ORAL
  Filled 2020-04-26 (×5): qty 1

## 2020-04-26 MED ORDER — HEPARIN SODIUM (PORCINE) 5000 UNIT/ML IJ SOLN
5000.0000 [IU] | Freq: Three times a day (TID) | INTRAMUSCULAR | Status: DC
  Administered 2020-04-26 – 2020-05-02 (×17): 5000 [IU] via SUBCUTANEOUS
  Filled 2020-04-26 (×17): qty 1

## 2020-04-26 MED ORDER — INSULIN ASPART 100 UNIT/ML ~~LOC~~ SOLN
0.0000 [IU] | Freq: Three times a day (TID) | SUBCUTANEOUS | Status: DC
  Administered 2020-04-27: 2 [IU] via SUBCUTANEOUS
  Administered 2020-04-27 – 2020-04-29 (×5): 3 [IU] via SUBCUTANEOUS
  Administered 2020-04-29: 5 [IU] via SUBCUTANEOUS
  Administered 2020-04-30 (×2): 2 [IU] via SUBCUTANEOUS
  Administered 2020-05-01 (×2): 3 [IU] via SUBCUTANEOUS
  Administered 2020-05-02: 2 [IU] via SUBCUTANEOUS
  Administered 2020-05-02: 3 [IU] via SUBCUTANEOUS

## 2020-04-26 MED ORDER — LEVOTHYROXINE SODIUM 112 MCG PO TABS
224.0000 ug | ORAL_TABLET | ORAL | Status: DC
  Administered 2020-04-27: 224 ug via ORAL
  Filled 2020-04-26: qty 2

## 2020-04-26 MED ORDER — ALLOPURINOL 100 MG PO TABS
100.0000 mg | ORAL_TABLET | Freq: Two times a day (BID) | ORAL | Status: DC
  Administered 2020-04-27 – 2020-05-02 (×12): 100 mg via ORAL
  Filled 2020-04-26 (×14): qty 1

## 2020-04-26 MED ORDER — TORSEMIDE 100 MG PO TABS
150.0000 mg | ORAL_TABLET | Freq: Every day | ORAL | Status: DC
  Administered 2020-04-27: 150 mg via ORAL
  Filled 2020-04-26: qty 1.5

## 2020-04-26 MED ORDER — HYDRALAZINE HCL 25 MG PO TABS
25.0000 mg | ORAL_TABLET | Freq: Three times a day (TID) | ORAL | Status: DC
  Administered 2020-04-27 (×4): 25 mg via ORAL
  Filled 2020-04-26 (×4): qty 1

## 2020-04-26 MED ORDER — FUROSEMIDE 10 MG/ML IJ SOLN
40.0000 mg | Freq: Once | INTRAMUSCULAR | Status: AC
  Administered 2020-04-26: 40 mg via INTRAVENOUS
  Filled 2020-04-26: qty 4

## 2020-04-26 MED ORDER — OMEGA-3-ACID ETHYL ESTERS 1 G PO CAPS
1.0000 g | ORAL_CAPSULE | Freq: Every day | ORAL | Status: DC
  Administered 2020-04-27 – 2020-05-02 (×6): 1 g via ORAL
  Filled 2020-04-26 (×6): qty 1

## 2020-04-26 MED ORDER — ACETAMINOPHEN 325 MG PO TABS
650.0000 mg | ORAL_TABLET | Freq: Three times a day (TID) | ORAL | Status: DC | PRN
  Administered 2020-04-28 – 2020-04-30 (×2): 650 mg via ORAL
  Filled 2020-04-26 (×2): qty 2

## 2020-04-26 NOTE — ED Notes (Signed)
Multiple IV attempts made without success, IV consult placed at this time.

## 2020-04-26 NOTE — ED Notes (Signed)
Patient to bed 37 at this time

## 2020-04-26 NOTE — H&P (Signed)
History and Physical    Tiffany [name] YTK:[id number] DOB: [birth year] DOA: 04/26/2020  PCP: Nolene Ebbs, MD  Patient coming from: Home, sister at bedside  I have personally briefly reviewed patient's old medical records in [place]  Chief Complaint: Increasing shortness of breath  HPI: Tiffany [name] is a 70 y.o. 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  For the past 3 weeks she has been having increasing shortness of breath with exertion.  She is barely ambulating without shortness of breath and some occasional chest tightness.  Also has been sleeping with 4 pillows for some time.  Has had several weeks of increasing lower extremity edema.  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 She was evaluated in the ED on 3/14 for the same and received one-time dose of IV 40 mg Lasix and had her home torsemide increased from 111m to 150 mg daily.  She has not noticed any difference with increased dose.  She was last hospitalized for acute on chronic heart failure exacerbation in December and at that time she was doing alternating 100 mg and 150 mg torsemide.  Of note, patient has CKD stage V but adamantly does not want to pursue dialysis.  ED Course: She was hypoxic only with exertion and stable on room air.  Hemoglobin stable at baseline of 9.5.  Creatinine elevated at 4.10 from a baseline of around 3.5-4 TSH notably more elevated at 10 compared to a week ago at 4.9.  Chest x-ray shows cardiomegaly and vascular congestion.  She was given IV Lasix 40 mg in the ED and hospitalist was called for admission.  Review of Systems: Constitutional: No Weight Change, No  Fever ENT/Mouth: No sore throat, No Rhinorrhea Eyes: No Eye Pain, No Vision Changes Cardiovascular: No Chest Pain, + SOB, + PND, + Dyspnea on Exertion, No Orthopnea, No Claudication, + Edema, No Palpitations Respiratory: No Cough, No Sputum, No Wheezing, no Dyspnea  Gastrointestinal: No Nausea, No Vomiting, No Diarrhea, No Constipation, No Pain Genitourinary: no Urinary Incontinence, No Urgency, No Flank Pain Musculoskeletal: No Arthralgias, No Myalgias Skin: No Skin Lesions, No Pruritus, Neuro: no Weakness, No Numbness Psych: No Anxiety/Panic, No Depression, no decrease appetite Heme/Lymph: No Bruising, No Bleeding  Past Medical History:  Diagnosis Date  . ALLERGIC RHINITIS   . Arthritis   . [place] 05/05[place]   Echo 03/2018:  EF 675-17 +diastolic dysfunction, GLS -20.5%, normal RVSF, RVSP 42.5 (mild elevated), severe LAE, small pericardial eff, mild MAc, mod AV calc, mild PI  . CKD (chronic kidney disease)   . Diabetes mellitus   . Heart murmur    at birth  . Hyperlipidemia   . Hypertension   . Hypothyroid   . Iron deficiency anemia   . Obesity   . OSA on CPAP   . Previous back surgery    x 2  . Refusal of blood transfusions as patient is Jehovah's Witness   . S/P arthroscopy     Past Surgical History:  Procedure Laterality Date  . BREAST BIOPSY  20+ yrs ago   dont know which breast per pt; benign  . BREAST REDUCTION SURGERY  1982  . KNEE SURGERY  x 2  . LUMBAR DISC SURGERY     x 2  . REDUCTION MAMMAPLASTY Bilateral 1985  . TOE SURGERY    . TOTAL ABDOMINAL HYSTERECTOMY  1997   partial hysterectomy- still has ovaries  . TOTAL KNEE ARTHROPLASTY Left 07/01/2019   Procedure: TOTAL KNEE ARTHROPLASTY;  Surgeon: Paralee Cancel, MD;  Location: [facility];  Service: Orthopedics;  Laterality: Left;  70 min NO BLOOD PRODUCTS!!  . TREATMENT FISTULA ANAL       reports that she has never smoked. She has never used smokeless tobacco. She reports  current alcohol use. She reports that she does not use drugs. Social History  No Known Allergies  Family History  Problem Relation Age of Onset  . Prostate cancer Father   . Congestive Heart Failure Mother   . Diabetes Sister   . Other Sister        septis  . Breast cancer Neg Hx      Prior to Admission medications   Medication Sig Start Date End Date Taking? Authorizing Provider  acetaminophen (TYLENOL) 650 MG CR tablet Take 650 mg by mouth every 8 (eight) hours as needed for pain.    [provider]  albuterol (VENTOLIN HFA) 108 (90 Base) MCG/ACT inhaler Inhale 1-2 puffs into the lungs every 6 (six) hours as needed for wheezing or shortness of breath.    [provider]  allopurinol (ZYLOPRIM) 100 MG tablet Take 100 mg by mouth 2 (two) times daily. 07/30/16   [provider]  amLODipine (NORVASC) 10 MG tablet Take 10 mg by mouth 2 (two) times daily.     [provider]  B Complex Vitamins (B COMPLEX PO) Take 1 tablet by mouth daily.    [provider]  B-D INS SYRINGE 0.5CC/30GX1/2" 30G X 1/2" 0.5 ML MISC TAKE AS DIRECTED TWICE A DAY 08/12/15   [provider]  BIOTIN 5000 PO Take 5,000 mcg by mouth daily.     [provider]  calcitRIOL (ROCALTROL) 0.25 MCG capsule Take 0.5 mcg by mouth daily at 6 PM. (1700)    [provider]  carvedilol (COREG) 6.25 MG tablet Take 6.25 mg by mouth in the morning and at bedtime.    [provider]  cetirizine (ZYRTEC) 10 MG tablet Take 10 mg by mouth daily as needed for allergies.    [provider]  cloNIDine (CATAPRES - DOSED IN MG/24 HR) 0.3 mg/24hr Place 0.3 mg onto the skin every Friday.     [provider]  Coenzyme Q10 100 MG capsule Take 100 mg by mouth daily.    [provider]  hydrALAZINE (APRESOLINE) 25 MG tablet Take 25 mg by mouth 3 (three) times daily.    [provider]  insulin glargine (LANTUS) 100 UNIT/ML injection  Inject 18 Units into the skin at bedtime.     [provider]  isosorbide mononitrate (IMDUR) 30 MG 24 hr tablet Take 1 tablet (30 mg total) by mouth daily. 01/25/20 04/24/20  Burtis Junes, NP  levothyroxine (SYNTHROID) 112 MCG tablet Take 224-336 mcg by mouth See admin instructions. Take 3 tablets (336 mcg) by mouth on Sundays at 2200 & take 2 tablets (224 mcg) by mouth on Mondays, Tuesdays, Wednesdays, Thursdays, Fridays, & Saturdays at 2200.    [provider]  Multiple Vitamins-Minerals (PRESERVISION AREDS PO) Take 1 tablet by mouth in the morning and at bedtime.    [provider]  NON FORMULARY Take 1 tablet by  mouth daily. Renadyl for kidneys    [provider]  Omega-3 Fatty Acids (FISH OIL) 500 MG CAPS Take 500 mg by mouth daily in the afternoon.    [provider]  polyethylene glycol (MIRALAX / GLYCOLAX) 17 g packet Take 17 g by mouth 2 (two) times daily. Patient not taking: Reported on 02/01/2020 07/02/19   Maurice March, PA-C  rosuvastatin (CRESTOR) 10 MG tablet Take 10 mg by mouth every evening.    [provider]  SYSTANE COMPLETE 0.6 % SOLN Place 1 drop into both eyes in the morning, at noon, in the evening, and at bedtime.    [provider]  torsemide (DEMADEX) 100 MG tablet Take 100-150 mg by mouth See admin instructions. Alternating between 1 tablet (100 mg) & 1.5 tablets (150 mg) every other day    [provider]  TURMERIC PO Take 1 tablet by mouth daily.    [provider]  zolpidem (AMBIEN) 5 MG tablet 1 at bedtime as needed for sleep Patient taking differently: Take 5 mg by mouth at bedtime as needed for sleep. 1 at bedtime as needed for sleep 10/26/19   Baird Lyons D, MD    Physical Exam: Vitals:   04/26/20 1716 04/26/20 1850 04/26/20 1852 04/26/20 2000  BP: (!) 157/68  (!) 147/67 127/69  Pulse: (!) 58  (!) 52 (!) 55  Resp: 19  (!) 21 17  Temp: 98.2 F (36.8 C)     SpO2: 96%   100% 100%  Weight:  113.4 kg    Height:  _0  (1.778 m)      Constitutional: NAD, calm, comfortable, elderly female appearing younger than stated age sitting at 40 degree incline Vitals:   04/26/20 1716 04/26/20 1850 04/26/20 1852 04/26/20 2000  BP: (!) 157/68  (!) 147/67 127/69  Pulse: (!) 58  (!) 52 (!) 55  Resp: 19  (!) 21 17  Temp: 98.2 F (36.8 C)     SpO2: 96%  100% 100%  Weight:  113.4 kg    Height:  _1  (1.778 m)     Eyes: PERRL, lids and conjunctivae normal ENMT: Mucous membranes are moist.  Neck: normal, supple Respiratory: clear to auscultation bilaterally, no wheezing, no crackles. Normal respiratory effort. No accessory muscle use.  Cardiovascular: Regular rate and rhythm, no murmurs / rubs / gallops.  +3 pitting edema bilateral lower extremity up to knee.   Abdomen: no tenderness, no masses palpated. Bowel sounds positive.  Musculoskeletal: no clubbing / cyanosis. No joint deformity upper and lower extremities. Good ROM, no contractures. Normal muscle tone.  Skin: no rashes, lesions, ulcers. No induration Neurologic: CN 2-12 grossly intact. Sensation intact,  Strength 5/5 in all 4.  Psychiatric: Normal judgment and insight. Alert and oriented x 3. Normal mood.     Labs on Admission: I have personally reviewed following labs and imaging studies  CBC: Recent Labs  Lab 04/26/20 0950 04/26/20 1724  WBC  --  4.0  HGB 10.2* 9.5*  HCT  --  30.6*  MCV  --  86.0  PLT  --  376*   Basic Metabolic Panel: Recent Labs  Lab 04/26/20 1724  NA 140  K 4.6  CL 107  CO2 21*  GLUCOSE 171*  BUN 131*  CREATININE 4.10*  CALCIUM 9.6   GFR: Estimated Creatinine Clearance: 17.7 mL/min (A) (by C-G formula based on SCr of 4.1 mg/dL (H)). Liver Function Tests: Recent Labs  Lab 04/26/20 1746  AST  27  ALT 29  ALKPHOS 112  BILITOT 0.5  PROT 6.4*  ALBUMIN 3.5   No results for input(s): LIPASE, AMYLASE in the last 168 hours. No results for input(s): AMMONIA in  the last 168 hours. Coagulation Profile: No results for input(s): INR, PROTIME in the last 168 hours. Cardiac Enzymes: No results for input(s): CKTOTAL, CKMB, CKMBINDEX, TROPONINI in the last 168 hours. BNP (last 3 results) Recent Labs    01/25/20 1506  PROBNP 2,507*   HbA1C: No results for input(s): HGBA1C in the last 72 hours. CBG: No results for input(s): GLUCAP in the last 168 hours. Lipid Profile: No results for input(s): CHOL, HDL, LDLCALC, TRIG, CHOLHDL, LDLDIRECT in the last 72 hours. Thyroid Function Tests: Recent Labs    04/26/20 1750  TSH 10.113*   Anemia Panel: No results for input(s): VITAMINB12, FOLATE, FERRITIN, TIBC, IRON, RETICCTPCT in the last 72 hours. Urine analysis:    Component Value Date/Time   COLORURINE YELLOW 04/17/2020 1222   APPEARANCEUR CLEAR 04/17/2020 1222   LABSPEC 1.013 04/17/2020 1222   PHURINE 5.0 04/17/2020 1222   GLUCOSEU NEGATIVE 04/17/2020 1222   HGBUR NEGATIVE 04/17/2020 1222   [place] 04/17/2020 1222   KETONESUR NEGATIVE 04/17/2020 1222   PROTEINUR 100 (A) 04/17/2020 1222   UROBILINOGEN 0.2 02/25/2019 1939   NITRITE NEGATIVE 04/17/2020 1222   LEUKOCYTESUR NEGATIVE 04/17/2020 1222    Radiological Exams on Admission: DG Chest 2 View  Result Date: 04/26/2020 CLINICAL DATA:  Shortness of breath EXAM: CHEST - 2 VIEW COMPARISON:  04/17/2020 FINDINGS: Cardiomegaly, vascular congestion. No overt edema. No confluent opacities or effusions. No acute bony abnormality. IMPRESSION: Cardiomegaly, vascular congestion. Electronically Signed   By: Rolm Baptise M.D.   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  Patient previously on 100 mg torsemide and increased to 150 mg daily.  Continue home torsemide and will give additional IV 40 mg Lasix  BID.  Obtain strict intake and output Daily weights Last echocardiogram in 01/2020 showed EF of 65 to 70% and grade 2 diastolic heart failure with small pericardial effusion obtain repeat echo   AKI on CKD stage 5 Creatinine of 4.10. ?  Baseline of around 3.5-4 Patient continues to refuse dialysis.  No urgent dialysis needed at this time. Continue to monitor closely while on IV diuretics Avoid nephrotoxic agent  Hypothyroidism  TSH at 10 from 4.9 about a week ago Normally on levothyroxine 336 mcg on Sundays and 224 mcg all other days. Increased to 336 mcg on Sundays and Mondays and 231mg all other days Follows with endocrinology outpatient medical follow-up in 6 weeks  Type 2 diabetes Normally takes 10 units Lantus at night  Start moderate SSI for now  OSA Continue nighttime CPAP  Hypertension Continue amlodipine, Coreg, hydroxyzine and weekly clonidine patch  Hyperlipidemia Continue statin and omega-3  DVT prophylaxis:.Subcu heparin Code Status: DNR Family Communication: Plan discussed with patient and sister at bedside  disposition Plan: Home with at least 2 midnight stays  Consults called:  Admission status: inpatient    Level of care: Inpatient         Ching T Tu DO [facility]   If 7PM-7AM, please contact night-coverage [email or web address]   04/26/2020, 8:59 PM

## 2020-04-26 NOTE — ED Provider Notes (Signed)
[facility] EMERGENCY DEPARTMENT Provider Note   CSN: [id number] Arrival date & time: 04/26/20  1705     History No chief complaint on file.   Tiffany [name] is a 70 y.o. female.  Pt presents to the ED today with sob.  Pt has had sob for the past several days.  She was seen in the ED on 3/14 for the same.  She had some mild CHF and was given 40 mg lasix IV.  Her torsemide was increased to 150 mg.  She said she has been taking it, but her legs are more swollen and her sob is not any better.  Sob more with exertion.  No cp or f/c.    There is a note from today from cardiology recommending IV diuresis.        Past Medical History:  Diagnosis Date  . ALLERGIC RHINITIS   . Arthritis   . Chronic diastolic heart failure ([place]) 05/05/2013   Echo 03/2018:  EF 69-62, +diastolic dysfunction, GLS -20.5%, normal RVSF, RVSP 42.5 (mild elevated), severe LAE, small pericardial eff, mild MAc, mod AV calc, mild PI  . CKD (chronic kidney disease)   . Diabetes mellitus   . Heart murmur    at birth  . Hyperlipidemia   . Hypertension   . Hypothyroid   . Iron deficiency anemia   . Obesity   . OSA on CPAP   . Previous back surgery    x 2  . Refusal of blood transfusions as patient is Jehovah's Witness   . S/P arthroscopy     Patient Active Problem List   Diagnosis Date Noted  . Acute CHF (congestive heart failure) ([place]) 02/01/2020  . S/P left TKA 07/01/2019  . Status post total left knee replacement 07/01/2019  . Hyperlipidemia 09/12/2016  . Type 2 diabetes mellitus without complications ([place]) 95/28/4132  . Tachycardia 08/26/2016  . Palpitation 08/26/2016  . SOB (shortness of breath) 08/26/2016  . Chronic kidney disease, stage V ([place]) 04/28/2014  . Chronic diastolic heart failure ([place]) 05/05/2013  . Essential hypertension 05/05/2013  . HEART MURMUR, SYSTOLIC 44/02/270  . UNSPECIFIED HYPOTHYROIDISM 02/12/2007  . OBESITY 01/08/2007  . Obstructive sleep apnea  01/08/2007  . Seasonal and perennial allergic rhinitis 01/08/2007    Past Surgical History:  Procedure Laterality Date  . BREAST BIOPSY  20+ yrs ago   dont know which breast per pt; benign  . BREAST REDUCTION SURGERY  1982  . KNEE SURGERY     x 2  . LUMBAR DISC SURGERY     x 2  . REDUCTION MAMMAPLASTY Bilateral 1985  . TOE SURGERY    . TOTAL ABDOMINAL HYSTERECTOMY  1997   partial hysterectomy- still has ovaries  . TOTAL KNEE ARTHROPLASTY Left 07/01/2019   Procedure: TOTAL KNEE ARTHROPLASTY;  Surgeon: Paralee Cancel, MD;  Location: [facility];  Service: Orthopedics;  Laterality: Left;  70 min NO BLOOD PRODUCTS!!  . TREATMENT FISTULA ANAL       OB History   No obstetric history on file.     Family History  Problem Relation Age of Onset  . Prostate cancer Father   . Congestive Heart Failure Mother   . Diabetes Sister   . Other Sister        septis  . Breast cancer Neg Hx     Social History   Tobacco Use  . Smoking status: Never Smoker  . Smokeless tobacco: Never Used  Vaping Use  . Vaping Use:  Never used  Substance Use Topics  . Alcohol use: Yes    Alcohol/week: 0.0 standard drinks    Comment: rare  . Drug use: No    Home Medications Prior to Admission medications   Medication Sig Start Date End Date Taking? Authorizing Provider  acetaminophen (TYLENOL) 650 MG CR tablet Take 650 mg by mouth every 8 (eight) hours as needed for pain.    [provider]  albuterol (VENTOLIN HFA) 108 (90 Base) MCG/ACT inhaler Inhale 1-2 puffs into the lungs every 6 (six) hours as needed for wheezing or shortness of breath.    [provider]  allopurinol (ZYLOPRIM) 100 MG tablet Take 100 mg by mouth 2 (two) times daily. 07/30/16   [provider]  amLODipine (NORVASC) 10 MG tablet Take 10 mg by mouth 2 (two) times daily.     [provider]  B Complex Vitamins (B COMPLEX PO) Take 1 tablet by mouth daily.    [provider]  B-D INS SYRINGE  0.5CC/30GX1/2" 30G X 1/2" 0.5 ML MISC TAKE AS DIRECTED TWICE A DAY 08/12/15   [provider]  BIOTIN 5000 PO Take 5,000 mcg by mouth daily.     [provider]  calcitRIOL (ROCALTROL) 0.25 MCG capsule Take 0.5 mcg by mouth daily at 6 PM. (1700)    [provider]  carvedilol (COREG) 6.25 MG tablet Take 6.25 mg by mouth in the morning and at bedtime.    [provider]  cetirizine (ZYRTEC) 10 MG tablet Take 10 mg by mouth daily as needed for allergies.    [provider]  cloNIDine (CATAPRES - DOSED IN MG/24 HR) 0.3 mg/24hr Place 0.3 mg onto the skin every Friday.     [provider]  Coenzyme Q10 100 MG capsule Take 100 mg by mouth daily.    [provider]  hydrALAZINE (APRESOLINE) 25 MG tablet Take 25 mg by mouth 3 (three) times daily.    [provider]  insulin glargine (LANTUS) 100 UNIT/ML injection Inject 18 Units into the skin at bedtime.     [provider]  isosorbide mononitrate (IMDUR) 30 MG 24 hr tablet Take 1 tablet (30 mg total) by mouth daily. 01/25/20 04/24/20  Burtis Junes, NP  levothyroxine (SYNTHROID) 112 MCG tablet Take 224-336 mcg by mouth See admin instructions. Take 3 tablets (336 mcg) by mouth on Sundays at 2200 & take 2 tablets (224 mcg) by mouth on Mondays, Tuesdays, Wednesdays, Thursdays, Fridays, & Saturdays at 2200.    [provider]  Multiple Vitamins-Minerals (PRESERVISION AREDS PO) Take 1 tablet by mouth in the morning and at bedtime.    [provider]  NON FORMULARY Take 1 tablet by mouth daily. Renadyl for kidneys    [provider]  Omega-3 Fatty Acids (FISH OIL) 500 MG CAPS Take 500 mg by mouth daily in the afternoon.    [provider]  polyethylene glycol (MIRALAX / GLYCOLAX) 17 g packet Take 17 g by mouth 2 (two) times daily. Patient not taking: Reported on 02/01/2020 07/02/19   Maurice March, PA-C  rosuvastatin (CRESTOR) 10 MG tablet Take  10 mg by mouth every evening.    [provider]  SYSTANE COMPLETE 0.6 % SOLN Place 1 drop into both eyes in the morning, at noon, in the evening, and at bedtime.    [provider]  torsemide (DEMADEX) 100 MG tablet Take 100-150 mg by mouth See admin instructions. Alternating between 1 tablet (  100 mg) & 1.5 tablets (150 mg) every other day    [provider]  TURMERIC PO Take 1 tablet by mouth daily.    [provider]  zolpidem (AMBIEN) 5 MG tablet 1 at bedtime as needed for sleep Patient taking differently: Take 5 mg by mouth at bedtime as needed for sleep. 1 at bedtime as needed for sleep 10/26/19   Deneise Lever, MD    Allergies    Patient has no known allergies.  Review of Systems   Review of Systems  Respiratory: Positive for shortness of breath.   All other systems reviewed and are negative.   Physical Exam Updated Vital Signs BP 127/69   Pulse (!) 52   Temp 98.2 F (36.8 C)   Resp 17   Ht _0  (1.778 m)   Wt 113.4 kg   SpO2 100%   BMI 35.87 kg/m   Physical Exam Vitals and nursing note reviewed.  Constitutional:      Appearance: Normal appearance. She is obese.  HENT:     Head: Normocephalic and atraumatic.     Right Ear: External ear normal.     Left Ear: External ear normal.     Nose: Nose normal.     Mouth/Throat:     Mouth: Mucous membranes are moist.     Pharynx: Oropharynx is clear.  Eyes:     Extraocular Movements: Extraocular movements intact.     Conjunctiva/sclera: Conjunctivae normal.     Pupils: Pupils are equal, round, and reactive to light.  Cardiovascular:     Rate and Rhythm: Normal rate and regular rhythm.     Pulses: Normal pulses.     Heart sounds: Normal heart sounds.  Pulmonary:     Effort: Tachypnea present.     Breath sounds: Normal breath sounds.  Abdominal:     General: Abdomen is flat. Bowel sounds are normal.     Palpations: Abdomen is soft.  Musculoskeletal:     Cervical back: Normal  range of motion and neck supple.     Right lower leg: Edema present.     Left lower leg: Edema present.  Skin:    Capillary Refill: Capillary refill takes less than 2 seconds.  Neurological:     General: No focal deficit present.     Mental Status: She is alert and oriented to person, place, and time.  Psychiatric:        Mood and Affect: Mood normal.        Behavior: Behavior normal.        Thought Content: Thought content normal.        Judgment: Judgment normal.     ED Results / Procedures / Treatments   Labs (all labs ordered are listed, but only abnormal results are displayed) Labs Reviewed  BASIC METABOLIC PANEL - Abnormal; Notable for the following components:      Result Value   CO2 21 (*)    Glucose, Bld 171 (*)    BUN 131 (*)    Creatinine, Ser 4.10 (*)    GFR, Estimated 11 (*)    All other components within normal limits  CBC - Abnormal; Notable for the following components:   RBC 3.56 (*)    Hemoglobin 9.5 (*)    HCT 30.6 (*)    RDW 19.9 (*)    Platelets 111 (*)    All other components within normal limits  BRAIN NATRIURETIC PEPTIDE - Abnormal; Notable for the following components:  B Natriuretic Peptide 322.2 (*)    All other components within normal limits  HEPATIC FUNCTION PANEL - Abnormal; Notable for the following components:   Total Protein 6.4 (*)    All other components within normal limits  TSH - Abnormal; Notable for the following components:   TSH 10.113 (*)    All other components within normal limits  TROPONIN I (HIGH SENSITIVITY) - Abnormal; Notable for the following components:   Troponin I (High Sensitivity) 99 (*)    All other components within normal limits  RESP PANEL BY RT-PCR (FLU A&B, COVID) [id number]  TROPONIN I (HIGH SENSITIVITY)    EKG EKG Interpretation  Date/Time:  Wednesday April 26 2020 17:12:40 EDT Ventricular Rate:  58 PR Interval:  196 QRS Duration: 90 QT Interval:  456 QTC Calculation: 447 R Axis:   77 Text  Interpretation: Sinus bradycardia Cannot rule out Anterior infarct , age undetermined Abnormal ECG No significant change since last tracing Confirmed by Isla Pence [phone]) on 04/26/2020 6:38:37 PM   Radiology DG Chest 2 View  Result Date: 04/26/2020 CLINICAL DATA:  Shortness of breath EXAM: CHEST - 2 VIEW COMPARISON:  04/17/2020 FINDINGS: Cardiomegaly, vascular congestion. No overt edema. No confluent opacities or effusions. No acute bony abnormality. IMPRESSION: Cardiomegaly, vascular congestion. Electronically Signed   By: Rolm Baptise M.D.   On: 04/26/2020 19:54    Procedures Procedures   Medications Ordered in ED Medications  furosemide (LASIX) injection 40 mg (has no administration in time range)    ED Course  I have reviewed the triage vital signs and the nursing notes.  Pertinent labs & imaging results that were available during my care of the patient were reviewed by me and considered in my medical decision making (see chart for details).    MDM Rules/Calculators/A&P                          Pt still has evidence of CHF on CXR and significant sob with ambulation despite increasing her demadex to 150 mg daily.  In addition, her kidney function is poor.  I think she needs an admission for IV diuretics with close watch on her kidneys.  Troponin is elevated, but it is chronically elevated.  Likely due to poor renal function.  No cp.  Pt d/w Dr. Flossie Buffy ([facility]) for admission.  Final Clinical Impression(s) / ED Diagnoses Final diagnoses:  CKD (chronic kidney disease) stage 5, GFR less than 15 ml/min (HCC)  Chronic anemia  Acute on chronic congestive heart failure, unspecified heart failure type [facility])    Rx / DC Orders ED Discharge Orders    None       Isla Pence, MD 04/26/20 2040

## 2020-04-26 NOTE — ED Triage Notes (Signed)
Patient complains of ongoing SOB since visit last week. Took additional diuretics as prescribed with no change in SOB. Denies cp on assessment

## 2020-04-26 NOTE — ED Notes (Signed)
Patient transported to X-ray 

## 2020-04-27 ENCOUNTER — Inpatient Hospital Stay (HOSPITAL_COMMUNITY): Payer: Medicare Other

## 2020-04-27 ENCOUNTER — Encounter (HOSPITAL_COMMUNITY): Payer: Self-pay | Admitting: Family Medicine

## 2020-04-27 DIAGNOSIS — I5033 Acute on chronic diastolic (congestive) heart failure: Secondary | ICD-10-CM | POA: Diagnosis not present

## 2020-04-27 DIAGNOSIS — N185 Chronic kidney disease, stage 5: Secondary | ICD-10-CM

## 2020-04-27 DIAGNOSIS — E785 Hyperlipidemia, unspecified: Secondary | ICD-10-CM

## 2020-04-27 DIAGNOSIS — I10 Essential (primary) hypertension: Secondary | ICD-10-CM

## 2020-04-27 DIAGNOSIS — E1122 Type 2 diabetes mellitus with diabetic chronic kidney disease: Secondary | ICD-10-CM

## 2020-04-27 LAB — ECHOCARDIOGRAM COMPLETE
AR max vel: 2.84 cm2
AV Area VTI: 2.62 cm2
AV Area mean vel: 2.74 cm2
AV Mean grad: 13 mmHg
AV Peak grad: 21.7 mmHg
Ao pk vel: 2.33 m/s
Area-P 1/2: 1.7 cm2
Height: 70 in
MV VTI: 2.03 cm2
S' Lateral: 2.6 cm
Single Plane A4C EF: 66.6 %
Weight: 4000 oz

## 2020-04-27 LAB — CBC
HCT: 31.5 % — ABNORMAL LOW (ref 36.0–46.0)
Hemoglobin: 9.8 g/dL — ABNORMAL LOW (ref 12.0–15.0)
MCH: 26.6 pg (ref 26.0–34.0)
MCHC: 31.1 g/dL (ref 30.0–36.0)
MCV: 85.4 fL (ref 80.0–100.0)
Platelets: 111 10*3/uL — ABNORMAL LOW (ref 150–400)
RBC: 3.69 MIL/uL — ABNORMAL LOW (ref 3.87–5.11)
RDW: 19.9 % — ABNORMAL HIGH (ref 11.5–15.5)
WBC: 4.2 10*3/uL (ref 4.0–10.5)
nRBC: 0 % (ref 0.0–0.2)

## 2020-04-27 LAB — GLUCOSE, CAPILLARY
Glucose-Capillary: 142 mg/dL — ABNORMAL HIGH (ref 70–99)
Glucose-Capillary: 152 mg/dL — ABNORMAL HIGH (ref 70–99)
Glucose-Capillary: 153 mg/dL — ABNORMAL HIGH (ref 70–99)

## 2020-04-27 LAB — BASIC METABOLIC PANEL
Anion gap: 10 (ref 5–15)
BUN: 129 mg/dL — ABNORMAL HIGH (ref 8–23)
CO2: 22 mmol/L (ref 22–32)
Calcium: 9.7 mg/dL (ref 8.9–10.3)
Chloride: 107 mmol/L (ref 98–111)
Creatinine, Ser: 4.14 mg/dL — ABNORMAL HIGH (ref 0.44–1.00)
GFR, Estimated: 11 mL/min — ABNORMAL LOW (ref >60)
Glucose, Bld: 149 mg/dL — ABNORMAL HIGH (ref 70–99)
Potassium: 4.4 mmol/L (ref 3.5–5.1)
Sodium: 139 mmol/L (ref 135–145)

## 2020-04-27 LAB — T4, FREE: Free T4: 1.57 ng/dL — ABNORMAL HIGH (ref 0.61–1.12)

## 2020-04-27 LAB — CBG MONITORING, ED: Glucose-Capillary: 173 mg/dL — ABNORMAL HIGH (ref 70–99)

## 2020-04-27 MED ORDER — INSULIN GLARGINE 100 UNIT/ML ~~LOC~~ SOLN
10.0000 [IU] | Freq: Every day | SUBCUTANEOUS | Status: DC
  Administered 2020-04-27 – 2020-05-01 (×5): 10 [IU] via SUBCUTANEOUS
  Filled 2020-04-27 (×7): qty 0.1

## 2020-04-27 MED ORDER — FUROSEMIDE 10 MG/ML IJ SOLN
120.0000 mg | Freq: Two times a day (BID) | INTRAMUSCULAR | Status: DC
  Administered 2020-04-27 – 2020-05-02 (×10): 120 mg via INTRAVENOUS
  Filled 2020-04-27 (×10): qty 12

## 2020-04-27 MED ORDER — LEVOTHYROXINE SODIUM 100 MCG PO TABS
200.0000 ug | ORAL_TABLET | Freq: Every day | ORAL | Status: DC
  Administered 2020-04-28 – 2020-05-02 (×5): 200 ug via ORAL
  Filled 2020-04-27 (×5): qty 2

## 2020-04-27 NOTE — Progress Notes (Signed)
  Echocardiogram 2D Echocardiogram has been performed.  Tiffany [name] 04/27/2020, 10:32 AM

## 2020-04-27 NOTE — Consult Note (Signed)
Cardiology Consultation:   Patient ID: Tiffany [name]; [id number]; [birth year]   Admit date: 04/26/2020 Date of Consult: 04/27/2020  Primary Care Provider: Nolene Ebbs, MD Primary Cardiologist: Dr. Daneen Schick, MD  Patient Profile:   Tiffany [name] is a 70 y.o. female with a hx of a history of chronic diastolic CHF, hypertension, OSA on CPAP, DM2, CKD stage V who has refused dialysis in the past, anemia of chronic disease, HLD and hypothyroidism who is being seen today for the evaluation of CHF at the request of Dr. Maylene Roes.  History of Present Illness:   Tiffany [name] is a 70 year old female with a history stated above who presented to [facility] 04/26/2020 with a several week history worsening shortness of breath with exertion, LE edema and orthopnea symptoms. 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 She was recently evaluated 04/17/2020 in the ED for similar symptoms at which time she received a single dose of IV Lasix and was recommended that she increase her daily torsemide to 138m QD (from alternating dosing of 100/150 QD). She states she did not feel any difference in her symptoms. She called her nephrology and cardiology teams for OP appointments however felt that she could not wait until then to be seen. She denies chest pain, palpitations, N/V, diaphoresis, dizziness or syncope. She is currently resting quietly in no distress.  In the ED, BNP found to be elevated at 322 however this is improved from 2 weeks prior at 508. CXR with vascular congestion consistent with CHF exacerbation.  Creatinine found to be elevated above her baseline at 4.14. HST elevated at 99 initially however trending down with last lab draw at 80.  EKG with HR 58, SB and no acute ST segment changes. Hemoglobin at 9.8. Covid test negative.  She is followed by Dr. STamala Julianfor her cardiology care. On last office assessment 01/25/2020 with LTruitt Merle shortness of breath felt to be multifactorial related to diastolic CHF, anemia, deconditioning and obesity.  Echocardiogram from 03/2018 showed normal LV function with impaired diastolic relaxation and elevated LVEDP with mildly elevated RVSP.  She has known chronic LE edema and was maintained on torsemide 100 mg daily at that time. Plan at that time was to evaluate an echocardiogram along with Myoview stress test. Nuclear stress test performed 01/27/2020 that showed an LVEF at 67% with no ST segment deviation during stress, no T wave inversion and no evidence of ischemia or infarct noted to be a low risk normal study.  Echocardiogram from 02/02/2020 with LVEF at 65 to 70% with G2 DD, mildly elevated PASP at 43.3 mmHg, small pericardial effusion, moderate MS and mild aortic valve stenosis.  She was hospitalized shortly after for CHF treated with IV Lasix 866mTID with improvement.  She was ultimately discharged on torsemide 100 mg alternating with 150 mg per nephrology recommendations. Creatinine peaked at 3.41.  Renal ultrasound was unremarkable. Plan was for outpatient follow-up with nephrology 2 weeks after discharge.   Past Medical History:  Diagnosis Date  . ALLERGIC RHINITIS   . Arthritis   . CHF (congestive heart failure) (H[place]  . Chronic diastolic heart failure ([place]4/02/2013   Echo 03/2018:  EF 6013-08+diastolic dysfunction, GLS -20.5%, normal RVSF, RVSP 42.5 (mild elevated), severe LAE, small pericardial eff, mild MAc, mod AV calc, mild PI  . CKD (chronic kidney disease)   . Diabetes mellitus   .  Dyspnea   . Heart murmur    at birth  . Hyperlipidemia   . Hypertension   . Hypothyroid   . Iron deficiency anemia   . Obesity   . OSA on CPAP   . Previous back surgery    x 2  . Refusal of blood transfusions as patient is Jehovah's Witness   . S/P arthroscopy     Past Surgical History:  Procedure Laterality Date  . BREAST BIOPSY  20+ yrs ago   dont know which breast per pt;  benign  . BREAST REDUCTION SURGERY  1982  . KNEE SURGERY     x 2  . LUMBAR DISC SURGERY     x 2  . REDUCTION MAMMAPLASTY Bilateral 1985  . TOE SURGERY    . TOTAL ABDOMINAL HYSTERECTOMY  1997   partial hysterectomy- still has ovaries  . TOTAL KNEE ARTHROPLASTY Left 07/01/2019   Procedure: TOTAL KNEE ARTHROPLASTY;  Surgeon: Paralee Cancel, MD;  Location: [facility];  Service: Orthopedics;  Laterality: Left;  70 min NO BLOOD PRODUCTS!!  . TREATMENT FISTULA ANAL       Prior to Admission medications   Medication Sig Start Date End Date Taking? Authorizing Provider  acetaminophen (TYLENOL) 650 MG CR tablet Take 650 mg by mouth every 8 (eight) hours as needed for pain.   Yes [provider]  albuterol (VENTOLIN HFA) 108 (90 Base) MCG/ACT inhaler Inhale 1-2 puffs into the lungs every 6 (six) hours as needed for wheezing or shortness of breath.   Yes [provider]  allopurinol (ZYLOPRIM) 100 MG tablet Take 100 mg by mouth 2 (two) times daily. 07/30/16  Yes [provider]  amLODipine (NORVASC) 10 MG tablet Take 10 mg by mouth 2 (two) times daily.    Yes [provider]  B Complex Vitamins (B COMPLEX PO) Take 1 tablet by mouth daily.   Yes [provider]  BIOTIN 5000 PO Take 5,000 mcg by mouth daily.    Yes [provider]  calcitRIOL (ROCALTROL) 0.25 MCG capsule Take 0.5 mcg by mouth daily.   Yes [provider]  carvedilol (COREG) 6.25 MG tablet Take 6.25 mg by mouth in the morning and at bedtime.   Yes [provider]  cetirizine (ZYRTEC) 10 MG tablet Take 10 mg by mouth daily as needed for allergies.   Yes [provider]  cloNIDine (CATAPRES - DOSED IN MG/24 HR) 0.3 mg/24hr Place 0.3 mg onto the skin every Friday.    Yes [provider]  Coenzyme Q10 100 MG capsule Take 100 mg by mouth daily.   Yes [provider]  hydrALAZINE (APRESOLINE) 25 MG tablet Take 25 mg by mouth 3 (three) times daily.    Yes [provider]  insulin glargine (LANTUS) 100 UNIT/ML injection Inject 10 Units into the skin at bedtime.   Yes [provider]  levothyroxine (SYNTHROID) 112 MCG tablet Take 224-336 mcg by mouth See admin instructions. Take 3 tablets (336 mcg) by mouth on Sundays at 2200 & take 2 tablets (224 mcg) by mouth on Mondays, Tuesdays, Wednesdays, Thursdays, Fridays, & Saturdays at 2200.   Yes [provider]  Multiple Vitamins-Minerals (PRESERVISION AREDS PO) Take 1 tablet by mouth in the morning and at bedtime.   Yes [provider]  NON FORMULARY Take 1 tablet by mouth daily. Renadyl for kidneys   Yes [provider]  Omega-3 Fatty Acids (FISH OIL) 500 MG CAPS Take 500 mg by mouth daily  in the afternoon.   Yes [provider]  polyethylene glycol (MIRALAX / GLYCOLAX) 17 g packet Take 17 g by mouth 2 (two) times daily. Patient taking differently: Take 17 g by mouth daily as needed for mild constipation. 07/02/19  Yes Griffith Citron R, PA-C  rosuvastatin (CRESTOR) 10 MG tablet Take 10 mg by mouth every evening.   Yes [provider]  SYSTANE COMPLETE 0.6 % SOLN Place 1 drop into both eyes in the morning, at noon, in the evening, and at bedtime.   Yes [provider]  torsemide (DEMADEX) 100 MG tablet Take 150 mg by mouth daily.   Yes [provider]  TURMERIC PO Take 1 tablet by mouth daily.   Yes [provider]  zolpidem (AMBIEN) 5 MG tablet 1 at bedtime as needed for sleep Patient taking differently: Take 5 mg by mouth at bedtime as needed for sleep. 1 at bedtime as needed for sleep 10/26/19  Yes Helt, Tarri Fuller D, MD  B-D INS SYRINGE 0.5CC/30GX1/2" 30G X 1/2" 0.5 ML MISC TAKE AS DIRECTED TWICE A DAY 08/12/15   [provider]  isosorbide mononitrate (IMDUR) 30 MG 24 hr tablet Take 1 tablet (30 mg total) by mouth daily. 01/25/20 04/24/20  Burtis Junes, NP    Inpatient Medications: Scheduled Meds: .  allopurinol  100 mg Oral BID  . amLODipine  10 mg Oral BID  . calcitRIOL  0.5 mcg Oral Daily  . carvedilol  6.25 mg Oral BID WC  . [START ON 04/28/2020] cloNIDine  0.3 mg Transdermal Q Fri  . furosemide  40 mg Intravenous BID  . heparin  5,000 Units Subcutaneous Q8H  . hydrALAZINE  25 mg Oral TID  . insulin aspart  0-15 Units Subcutaneous TID WC  . insulin aspart  0-5 Units Subcutaneous QHS  . insulin glargine  10 Units Subcutaneous QHS  . isosorbide mononitrate  30 mg Oral Daily  . [START ON 04/28/2020] levothyroxine  200 mcg Oral Q0600  . omega-3 acid ethyl esters  1 g Oral Daily  . rosuvastatin  10 mg Oral QPM   Continuous Infusions:  PRN Meds: acetaminophen, polyethylene glycol, zolpidem  Allergies:   No Known Allergies  Social History:   Social History   Socioeconomic History  . Marital status: Single    Spouse name: Not on file  . Number of children: Not on file  . Years of education: Not on file  . Highest education level: Not on file  Occupational History  . Occupation: school bus driver  Tobacco Use  . Smoking status: Never Smoker  . Smokeless tobacco: Never Used  Vaping Use  . Vaping Use: Never used  Substance and Sexual Activity  . Alcohol use: Yes    Alcohol/week: 0.0 standard drinks    Comment: rare  . Drug use: No  . Sexual activity: Not Currently    Birth control/protection: Surgical  Other Topics Concern  . Not on file  Social History Narrative  . Not on file   Social Determinants of Health   Financial Resource Strain: Not on file  Food Insecurity: Not on file  Transportation Needs: Not on file  Physical Activity: Not on file  Stress: Not on file  Social Connections: Not on file  Intimate Partner Violence: Not on file    Family History:   Family History  Problem Relation Age of Onset  . Prostate cancer Father   . Congestive Heart Failure Mother   . Diabetes Sister   .  Other Sister        septis  . Breast cancer Neg Hx    Family  Status:  Family Status  Relation Name Status  . Father  Deceased  . Mother  Deceased  . Sister  Deceased  . Sister  (Not Specified)  . Sister  (Not Specified)  . MGM  Deceased  . MGF  Deceased  . PGM  Deceased  . PGF  Deceased  . Neg Hx  (Not Specified)    ROS:  Please see the history of present illness.  All other ROS reviewed and negative.     Physical Exam/Data:   Vitals:   04/27/20 1145 04/27/20 1215 04/27/20 1354 04/27/20 1400  BP: 117/61 133/69  (!) 160/81  Pulse:    61  Resp: 16 (!) 21  16  Temp:    98.2 F (36.8 C)  TempSrc:    Oral  SpO2:    98%  Weight:   113.8 kg   Height:   _0  (1.778 m)     Intake/Output Summary (Last 24 hours) at 04/27/2020 1516 Last data filed at 04/27/2020 1432 Gross per 24 hour  Intake 200 ml  Output 1400 ml  Net -1200 ml   Filed Weights   04/26/20 1850 04/27/20 1354  Weight: 113.4 kg 113.8 kg   Body mass index is 36 kg/m.    General: Overweight,  NAD Neck: Negative for carotid bruits. No JVD Lungs: Diminished but clear. Breathing is unlabored. Cardiovascular: RRR with S1 S2. No murmurs Abdomen: Soft, non-tender, non-distended. No obvious abdominal masses. Extremities: 2+ BLE to knees edema. Radial pulses 2+ bilaterally Neuro: Alert and oriented. No focal deficits. No facial asymmetry. MAE spontaneously. Psych: Responds to questions appropriately with normal affect.     EKG:  The EKG was personally reviewed and demonstrates: 04/27/20 SB with HR 58bpm and no acute changes Telemetry:  Telemetry was personally reviewed and demonstrates: 04/27/20 NSR/SB  Relevant CV Studies:  Echocardiogram 02/02/2020:  1. Left ventricular ejection fraction, by estimation, is 65 to 70%. The  left ventricle has normal function. The left ventricle has no regional  wall motion abnormalities. There is severe left ventricular hypertrophy.  Left ventricular diastolic parameters  are consistent with Grade II diastolic dysfunction  (pseudonormalization).  Elevated left atrial pressure.  2. Right ventricular systolic function is normal. The right ventricular  size is normal. There is mildly elevated pulmonary artery systolic  pressure. The estimated right ventricular systolic pressure is 68.6 mmHg.  3. A small pericardial effusion is present.  4. Left atrial size was severely dilated.  5. The mitral valve is abnormal. Severe mitral annular calcification.  Trivial mitral valve regurgitation. Moderate mitral stenosis. MG 8 mmHg at  HR 65 bpm, MVA 1.5 cm^2 by continuity equation  6. The aortic valve is tricuspid. There is moderate thickening of the  aortic valve. Aortic valve regurgitation is not visualized. Mild aortic  valve stenosis. Vmax 2.9 m/s, MG 32mHg, AVA 1.9 cm^2, DI 0.6  7. The inferior vena cava is dilated in size with <50% respiratory  variability, suggesting right atrial pressure of 15 mmHg.   Gated nuclear stress test 01/26/2020:   The left ventricular ejection fraction is hyperdynamic (>65%).  Nuclear stress EF: 67%.  There was no ST segment deviation noted during stress.  No T wave inversion was noted during stress.  The study is normal.  This is a low risk study.  Laboratory Data:  Chemistry Recent Labs  Lab 04/26/20 1724  04/27/20 0456  NA 140 139  K 4.6 4.4  CL 107 107  CO2 21* 22  GLUCOSE 171* 149*  BUN 131* 129*  CREATININE 4.10* 4.14*  CALCIUM 9.6 9.7  GFRNONAA 11* 11*  ANIONGAP 12 10    Total Protein  Date Value Ref Range Status  04/26/2020 6.4 (L) 6.5 - 8.1 g/dL Final   Albumin  Date Value Ref Range Status  04/26/2020 3.5 3.5 - 5.0 g/dL Final   AST  Date Value Ref Range Status  04/26/2020 27 15 - 41 U/L Final   ALT  Date Value Ref Range Status  04/26/2020 29 0 - 44 U/L Final   Alkaline Phosphatase  Date Value Ref Range Status  04/26/2020 112 38 - 126 U/L Final   Total Bilirubin  Date Value Ref Range Status  04/26/2020 0.5 0.3 - 1.2 mg/dL Final    Hematology Recent Labs  Lab 04/26/20 0950 04/26/20 1724 04/27/20 0456  WBC  --  4.0 4.2  RBC  --  3.56* 3.69*  HGB 10.2* 9.5* 9.8*  HCT  --  30.6* 31.5*  MCV  --  86.0 85.4  MCH  --  26.7 26.6  MCHC  --  31.0 31.1  RDW  --  19.9* 19.9*  PLT  --  111* 111*   Cardiac EnzymesNo results for input(s): TROPONINI in the last 168 hours. No results for input(s): TROPIPOC in the last 168 hours.  BNP Recent Labs  Lab 04/26/20 1746  BNP 322.2*    DDimer No results for input(s): DDIMER in the last 168 hours. TSH:  Lab Results  Component Value Date   TSH 10.113 (H) 04/26/2020   Lipids:No results found for: CHOL, HDL, LDLCALC, LDLDIRECT, TRIG, CHOLHDL HgbA1c: Lab Results  Component Value Date   HGBA1C 6.3 (H) 04/26/2020    Radiology/Studies:  DG Chest 2 View  Result Date: 04/26/2020 CLINICAL DATA:  Shortness of breath EXAM: CHEST - 2 VIEW COMPARISON:  04/17/2020 FINDINGS: Cardiomegaly, vascular congestion. No overt edema. No confluent opacities or effusions. No acute bony abnormality. IMPRESSION: Cardiomegaly, vascular congestion. Electronically Signed   By: Rolm Baptise M.D.   On: 04/26/2020 19:54   ECHOCARDIOGRAM COMPLETE  Result Date: 04/27/2020    ECHOCARDIOGRAM REPORT   Patient Name:   [facility] Date of Exam: 04/27/2020 Medical Rec #:  [id number]         Height:       70.0 in Accession #:    [id number]        Weight:       250.0 lb Date of Birth:  1950-11-23          BSA:          2.295 m Patient Age:    51 years          BP:           150/69 mmHg Patient Gender: F                 HR:           63 bpm. Exam Location:  Inpatient Procedure: 2D Echo, Cardiac Doppler and Color Doppler Indications:     CHF  History:         Patient has prior history of Echocardiogram examinations, most                  recent 02/02/2020. CHF, Signs/Symptoms:Shortness of Breath;  Risk Factors:Diabetes, Hypertension and Dyslipidemia. CKD.  Sonographer:     Dustin Flock  Referring Phys:  [id number] [place] [facility] Diagnosing Phys: Ena Dawley MD IMPRESSIONS  1. Left ventricular ejection fraction, by estimation, is 65 to 70%. The left ventricle has normal function. The left ventricle has no regional wall motion abnormalities. There is severe concentric left ventricular hypertrophy. Left ventricular diastolic  parameters are consistent with Grade II diastolic dysfunction (pseudonormalization). Elevated left atrial pressure.  2. Right ventricular systolic function is normal. The right ventricular size is normal. There is mildly elevated pulmonary artery systolic pressure. The estimated right ventricular systolic pressure is 38.2 mmHg.  3. Left atrial size was moderately dilated.  4. Right atrial size was mildly dilated.  5. A small pericardial effusion is present. The pericardial effusion is circumferential. There is no evidence of cardiac tamponade.  6. The mitral valve is normal in structure. Mild mitral valve regurgitation. Mild mitral stenosis. Severe mitral annular calcification.  7. The aortic valve is normal in structure. There is moderate calcification of the aortic valve. There is moderate thickening of the aortic valve. Aortic valve regurgitation is not visualized. Mild to moderate aortic valve sclerosis/calcification is present, without any evidence of aortic stenosis.  8. The inferior vena cava is dilated in size with <50% respiratory variability, suggesting right atrial pressure of 15 mmHg. FINDINGS  Left Ventricle: Left ventricular ejection fraction, by estimation, is 65 to 70%. The left ventricle has normal function. The left ventricle has no regional wall motion abnormalities. The left ventricular internal cavity size was normal in size. There is  severe concentric left ventricular hypertrophy. Left ventricular diastolic parameters are consistent with Grade II diastolic dysfunction (pseudonormalization). Elevated left atrial pressure. Right Ventricle: The right ventricular  size is normal. No increase in right ventricular wall thickness. Right ventricular systolic function is normal. There is mildly elevated pulmonary artery systolic pressure. The tricuspid regurgitant velocity is 2.57  m/s, and with an assumed right atrial pressure of 15 mmHg, the estimated right ventricular systolic pressure is 50.5 mmHg. Left Atrium: Left atrial size was moderately dilated. Right Atrium: Right atrial size was mildly dilated. Pericardium: A small pericardial effusion is present. The pericardial effusion is circumferential. There is no evidence of cardiac tamponade. Mitral Valve: The mitral valve is normal in structure. There is mild thickening of the mitral valve leaflet(s). There is mild calcification of the mitral valve leaflet(s). Severe mitral annular calcification. Mild mitral valve regurgitation. Mild mitral valve stenosis. MV peak gradient, 12.7 mmHg. The mean mitral valve gradient is 6.0 mmHg with average heart rate of 57 bpm. Tricuspid Valve: The tricuspid valve is normal in structure. Tricuspid valve regurgitation is mild . No evidence of tricuspid stenosis. Aortic Valve: The aortic valve is normal in structure. There is moderate calcification of the aortic valve. There is moderate thickening of the aortic valve. Aortic valve regurgitation is not visualized. Mild to moderate aortic valve sclerosis/calcification is present, without any evidence of aortic stenosis. Aortic valve mean gradient measures 13.0 mmHg. Aortic valve peak gradient measures 21.7 mmHg. Aortic valve area, by VTI measures 2.62 cm. Pulmonic Valve: The pulmonic valve was normal in structure. Pulmonic valve regurgitation is mild. No evidence of pulmonic stenosis. Aorta: The aortic root is normal in size and structure. Venous: The inferior vena cava is dilated in size with less than 50% respiratory variability, suggesting right atrial pressure of 15 mmHg. IAS/Shunts: No atrial level shunt detected by color flow Doppler.  LEFT  VENTRICLE PLAX 2D  LVIDd:         4.50 cm      Diastology LVIDs:         2.60 cm      LV e' medial:    6.31 cm/s LV PW:         1.80 cm      LV E/e' medial:  27.9 LV IVS:        1.90 cm      LV e' lateral:   6.20 cm/s LVOT diam:     2.20 cm      LV E/e' lateral: 28.4 LV SV:         147 LV SV Index:   64 LVOT Area:     3.80 cm  LV Volumes (MOD) LV vol d, MOD A4C: 113.0 ml LV vol s, MOD A4C: 37.7 ml LV SV MOD A4C:     113.0 ml RIGHT VENTRICLE RV Basal diam:  4.20 cm RV S prime:     17.30 cm/s TAPSE (M-mode): 3.0 cm LEFT ATRIUM              Index       RIGHT ATRIUM           Index LA diam:        4.80 cm  2.09 cm/m  RA Area:     26.90 cm LA Vol (A2C):   118.0 ml 51.42 ml/m RA Volume:   85.60 ml  37.30 ml/m LA Vol (A4C):   102.0 ml 44.45 ml/m LA Biplane Vol: 114.0 ml 49.67 ml/m  AORTIC VALVE AV Area (Vmax):    2.84 cm AV Area (Vmean):   2.74 cm AV Area (VTI):     2.62 cm AV Vmax:           233.00 cm/s AV Vmean:          168.000 cm/s AV VTI:            0.562 m AV Peak Grad:      21.7 mmHg AV Mean Grad:      13.0 mmHg LVOT Vmax:         174.00 cm/s LVOT Vmean:        121.000 cm/s LVOT VTI:          0.388 m LVOT/AV VTI ratio: 0.69  AORTA Ao Root diam: 3.30 cm MITRAL VALVE                TRICUSPID VALVE MV Area (PHT): 1.70 cm     TR Peak grad:   26.4 mmHg MV Area VTI:   2.03 cm     TR Vmax:        257.00 cm/s MV Peak grad:  12.7 mmHg MV Mean grad:  6.0 mmHg     SHUNTS MV Vmax:       1.78 m/s     Systemic VTI:  0.39 m MV Vmean:      109.5 cm/s   Systemic Diam: 2.20 cm MV Decel Time: 447 msec MV E velocity: 176.00 cm/s MV A velocity: 166.00 cm/s MV E/A ratio:  1.06 Ena Dawley MD Electronically signed by Ena Dawley MD Signature Date/Time: 04/27/2020/12:37:22 PM    Final (Updated)     Assessment and Plan:   1.  Acute on chronic diastolic CHF: -Patient followed by Dr. Tamala Julian for her cardiology care.  She has a history of frequent ED admissions for CHF exacerbation.  She was most recently seen in the ED  for similar  symptoms 04/17/2020 treated with alternating torsemide dosing between 100/150 mg daily. She presented with worsening shortness of breath found to have a BNP with CXR consistent with CHF exacerbation. -She is currently being treated with IV Lasix 40 mg twice daily -Creatinine elevated above baseline at 4.14 -Would consider increasing IV Lasix to 80 mg twice daily and following renal function closely with nephrology consultation -Weight, 250lb>> no change in weight from 04/17/20 however weight at last office visit 01/25/20 at 270lb  -I&O, net -1.2 L -Repeat echocardiogram is pending  2.  CKD stage V: -Creatinine, 4.14 with a baseline which appears to be in the 3.0 range -She has ademently deferred HD in the past -Follows with outpatient nephrology>>would consult for diuretic assistance   3.  HTN:  -Stable, 160/81, 133/69, 117/61 -Continue current regimen with amlodipine 10, carvedilol 6.25, clonidine patch 0.3 mg, hydralazine 25 3 times daily, Imdur 30,  4.  HLD: -Treated with Lovaza>>continue   5.  DM2: -HbA1c, 6.2 -SSI for glucose control while inpatient status -Continue Lantus -Management per IM  Other hospital problems include: -OSA -Hypothyroidism   For questions or updates, please contact [facility] Please consult [email or web address] for contact info under Cardiology/STEMI.   SignedKathyrn Drown NP-C [facility] Pager: [phone] 04/27/2020 3:16 PM

## 2020-04-27 NOTE — ED Notes (Signed)
Patient transported to echo ?

## 2020-04-27 NOTE — ED Notes (Signed)
Assisted patient to bathroom , placed in hospital bed.

## 2020-04-27 NOTE — Progress Notes (Signed)
Heart Failure Navigator Progress Note  Assessed for Heart & Vascular TOC clinic readiness.  Unfortunately at this time the patient does not meet criteria due to CKD V, SCr 4+.   Navigator available for reassessment of patient.   Pricilla Holm, RN, BSN Heart Failure Nurse Navigator [phone]

## 2020-04-27 NOTE — Progress Notes (Signed)
PROGRESS NOTE    Tiffany [name]  IRJ:[id number] DOB: 1951-01-05 DOA: 04/26/2020 PCP: Nolene Ebbs, MD     Brief Narrative:  Tiffany [name] is a 70 year old female with past medical history significant for chronic diastolic heart failure, hypertension, OSA on CPAP, insulin-dependent type 2 diabetes, hypothyroidism, CKD stage V and has refused dialysis in the past, anemia of chronic disease, hyperlipidemia who presents to the hospital with worsening shortness of breath.  She states that for the past several weeks, she has been having worsening shortness of breath with exertion, chest tightness, has been sleeping with 4 pillows, with peripheral edema.  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  She was evaluated in the emergency department on 3/14, received one-time dose of IV Lasix, increased home torsemide dose and discharged home.  She was also hospitalized in December for heart failure exacerbation.  New events last 24 hours / Subjective: She remains dyspneic with exertion, even getting up to the bathroom.  She is unable to lay flat due to shortness of breath.  Assessment & Plan:   Principal Problem:   Acute on chronic diastolic (congestive) heart failure (HCC) Active Problems:   Hypothyroidism   Obstructive sleep apnea   Essential hypertension   Chronic kidney disease, stage V (HCC)   Hyperlipidemia   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)   Acute on chronic diastolic heart failure -BNP 322.2 -Hold home torsemide -Continue IV Lasix -Strict I's and O's, daily weight -Repeat echocardiogram pending -Cardiology consulted  AKI on CKD stage V -Baseline creatinine around 3.5-4 -Patient was evaluated by nephrology in the past, patient has refused dialysis.  No urgent dialysis needed at this time -Continue to monitor BMP while on diuretic  Demand ischemia -Troponin 99 --> 80 -Secondary to  above -Echocardiogram pending  Hypothyroidism -TSH elevated.  Free T4 elevated -Continue Synthroid, decrease Synthroid dose today -Follows with endocrinology as an outpatient  Type 2 diabetes, insulin-dependent -Continue Lantus, sliding scale insulin  Hypertension -Continue amlodipine, Coreg, clonidine, hydralazine, imdur   Hyperlipidemia -Continue crestor, lovaza   OSA -CPAP nightly   DVT prophylaxis:  heparin injection 5,000 Units Start: 04/26/20 2200  Code Status: DNR Family Communication: Family at bedside Disposition Plan:  Status is: Inpatient  Remains inpatient appropriate because:IV treatments appropriate due to intensity of illness or inability to take PO and Inpatient level of care appropriate due to severity of illness   Dispo: The patient is from: Home              Anticipated d/c is to: Home              Patient currently is not medically stable to d/c.  Continue IV Lasix.  Cardiology consulted.   Difficult to place patient No      Consultants:   Cardiology   Antimicrobials:  Anti-infectives (From admission, onward)   None        Objective: Vitals:   04/27/20 0730 04/27/20 0745 04/27/20 0815 04/27/20 1045  BP: 126/65 138/64 (!) 150/69 129/66  Pulse:      Resp: (!) 22 17 (!) 22 (!) 22  Temp:      TempSrc:      SpO2:      Weight:      Height:        Intake/Output Summary (Last 24 hours) at 04/27/2020 1124 Last data filed at 04/27/2020 [phone]  Gross per 24 hour  Intake --  Output 600 ml  Net -600 ml   Filed Weights   04/26/20 1850  Weight: 113.4 kg    Examination:  General exam: Appears calm and comfortable  Respiratory system: Clear to auscultation. Respiratory effort normal. No respiratory distress. No conversational dyspnea.  On room air Cardiovascular system: S1 & S2 heard, RRR. No murmurs. + Bilateral pedal edema. Gastrointestinal system: Abdomen is nondistended, soft and nontender. Normal bowel sounds heard. Central  nervous system: Alert and oriented. No focal neurological deficits. Speech clear.  Extremities: Symmetric in appearance  Skin: No rashes, lesions or ulcers on exposed skin  Psychiatry: Judgement and insight appear normal. Mood & affect appropriate.   Data Reviewed: I have personally reviewed following labs and imaging studies  CBC: Recent Labs  Lab 04/26/20 0950 04/26/20 1724 04/27/20 0456  WBC  --  4.0 4.2  HGB 10.2* 9.5* 9.8*  HCT  --  30.6* 31.5*  MCV  --  86.0 85.4  PLT  --  111* 916*   Basic Metabolic Panel: Recent Labs  Lab 04/26/20 1724 04/27/20 0456  NA 140 139  K 4.6 4.4  CL 107 107  CO2 21* 22  GLUCOSE 171* 149*  BUN 131* 129*  CREATININE 4.10* 4.14*  CALCIUM 9.6 9.7   GFR: Estimated Creatinine Clearance: 17.5 mL/min (A) (by C-G formula based on SCr of 4.14 mg/dL (H)). Liver Function Tests: Recent Labs  Lab 04/26/20 1746  AST 27  ALT 29  ALKPHOS 112  BILITOT 0.5  PROT 6.4*  ALBUMIN 3.5   No results for input(s): LIPASE, AMYLASE in the last 168 hours. No results for input(s): AMMONIA in the last 168 hours. Coagulation Profile: No results for input(s): INR, PROTIME in the last 168 hours. Cardiac Enzymes: No results for input(s): CKTOTAL, CKMB, CKMBINDEX, TROPONINI in the last 168 hours. BNP (last 3 results) Recent Labs    01/25/20 1506  PROBNP 2,507*   HbA1C: Recent Labs    04/26/20 2150  HGBA1C 6.3*   CBG: Recent Labs  Lab 04/26/20 2238 04/27/20 0759  GLUCAP 139* 173*   Lipid Profile: No results for input(s): CHOL, HDL, LDLCALC, TRIG, CHOLHDL, LDLDIRECT in the last 72 hours. Thyroid Function Tests: Recent Labs    04/26/20 1750 04/27/20 0914  TSH 10.113*  --   FREET4  --  1.57*   Anemia Panel: No results for input(s): VITAMINB12, FOLATE, FERRITIN, TIBC, IRON, RETICCTPCT in the last 72 hours. Sepsis Labs: No results for input(s): PROCALCITON, LATICACIDVEN in the last 168 hours.  Recent Results (from the past 240 hour(s))   Resp Panel by RT-PCR (Flu A&B, Covid) Nasopharyngeal Swab     Status: None   Collection Time: 04/26/20  8:42 PM   Specimen: Nasopharyngeal Swab; Nasopharyngeal(NP) swabs in vial transport medium  Result Value Ref Range Status   SARS Coronavirus 2 by RT PCR NEGATIVE NEGATIVE Final    Comment: (NOTE) SARS-CoV-2 target nucleic acids are NOT DETECTED.  The SARS-CoV-2 RNA is generally detectable in upper respiratory specimens during the acute phase of infection. The lowest concentration of SARS-CoV-2 viral copies this assay can detect is 138 copies/mL. A negative result does not preclude SARS-Cov-2 infection and should not be used as the sole basis for treatment or other patient management decisions. A negative result may occur with  improper specimen collection/handling, submission of specimen other than nasopharyngeal swab, presence of viral mutation(s) within the areas targeted by this assay, and inadequate number of viral copies(<138  copies/mL). A negative result must be combined with clinical observations, patient history, and epidemiological information. The expected result is Negative.  Fact Sheet for Patients:  EntrepreneurPulse.com.au  Fact Sheet for Healthcare Providers:  IncredibleEmployment.be  This test is no t yet approved or cleared by the [place] FDA and  has been authorized for detection and/or diagnosis of SARS-CoV-2 by FDA under an Emergency Use Authorization (EUA). This EUA will remain  in effect (meaning this test can be used) for the duration of the COVID-19 declaration under Section 564(b)(1) of the Act, 21 U.S.C.section 360bbb-3(b)(1), unless the authorization is terminated  or revoked sooner.       Influenza A by PCR NEGATIVE NEGATIVE Final   Influenza B by PCR NEGATIVE NEGATIVE Final    Comment: (NOTE) The Xpert Xpress SARS-CoV-2/FLU/RSV plus assay is intended as an aid in the diagnosis of influenza from  Nasopharyngeal swab specimens and should not be used as a sole basis for treatment. Nasal washings and aspirates are unacceptable for Xpert Xpress SARS-CoV-2/FLU/RSV testing.  Fact Sheet for Patients: EntrepreneurPulse.com.au  Fact Sheet for Healthcare Providers: IncredibleEmployment.be  This test is not yet approved or cleared by the [place] FDA and has been authorized for detection and/or diagnosis of SARS-CoV-2 by FDA under an Emergency Use Authorization (EUA). This EUA will remain in effect (meaning this test can be used) for the duration of the COVID-19 declaration under Section 564(b)(1) of the Act, 21 U.S.C. section 360bbb-3(b)(1), unless the authorization is terminated or revoked.  Performed at [facility], [place] [address], [place], [place] [address]       Radiology Studies: DG Chest 2 View  Result Date: 04/26/2020 CLINICAL DATA:  Shortness of breath EXAM: CHEST - 2 VIEW COMPARISON:  04/17/2020 FINDINGS: Cardiomegaly, vascular congestion. No overt edema. No confluent opacities or effusions. No acute bony abnormality. IMPRESSION: Cardiomegaly, vascular congestion. Electronically Signed   By: Rolm Baptise M.D.   On: 04/26/2020 19:54      Scheduled Meds: . allopurinol  100 mg Oral BID  . amLODipine  10 mg Oral BID  . calcitRIOL  0.5 mcg Oral Daily  . carvedilol  6.25 mg Oral BID WC  . [START ON 04/28/2020] cloNIDine  0.3 mg Transdermal Q Fri  . furosemide  40 mg Intravenous BID  . heparin  5,000 Units Subcutaneous Q8H  . hydrALAZINE  25 mg Oral TID  . insulin aspart  0-15 Units Subcutaneous TID WC  . insulin aspart  0-5 Units Subcutaneous QHS  . isosorbide mononitrate  30 mg Oral Daily  . levothyroxine  224 mcg Oral Once per day on Tue Wed Thu Fri Sat   And  . [START ON 04/30/2020] levothyroxine  336 mcg Oral Once per day on Sun Mon  . omega-3 acid ethyl esters  1 g Oral Daily  . rosuvastatin  10 mg Oral QPM  .  torsemide  150 mg Oral Daily   Continuous Infusions:   LOS: 1 day      Time spent: 25 minutes   Dessa Phi, DO [facility] 04/27/2020, 11:24 AM   Available via Epic secure chat 7am-7pm After these hours, please refer to coverage provider listed on [email or web address]

## 2020-04-27 NOTE — Plan of Care (Signed)

## 2020-04-28 LAB — GLUCOSE, CAPILLARY
Glucose-Capillary: 113 mg/dL — ABNORMAL HIGH (ref 70–99)
Glucose-Capillary: 160 mg/dL — ABNORMAL HIGH (ref 70–99)
Glucose-Capillary: 160 mg/dL — ABNORMAL HIGH (ref 70–99)
Glucose-Capillary: 184 mg/dL — ABNORMAL HIGH (ref 70–99)

## 2020-04-28 LAB — BASIC METABOLIC PANEL
Anion gap: 11 (ref 5–15)
BUN: 125 mg/dL — ABNORMAL HIGH (ref 8–23)
CO2: 23 mmol/L (ref 22–32)
Calcium: 9 mg/dL (ref 8.9–10.3)
Chloride: 106 mmol/L (ref 98–111)
Creatinine, Ser: 4.22 mg/dL — ABNORMAL HIGH (ref 0.44–1.00)
GFR, Estimated: 11 mL/min — ABNORMAL LOW (ref >60)
Glucose, Bld: 113 mg/dL — ABNORMAL HIGH (ref 70–99)
Potassium: 4 mmol/L (ref 3.5–5.1)
Sodium: 140 mmol/L (ref 135–145)

## 2020-04-28 MED ORDER — HYDRALAZINE HCL 50 MG PO TABS
50.0000 mg | ORAL_TABLET | Freq: Three times a day (TID) | ORAL | Status: DC
  Administered 2020-04-28 – 2020-05-02 (×13): 50 mg via ORAL
  Filled 2020-04-28 (×13): qty 1

## 2020-04-28 NOTE — Consult Note (Signed)
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 Rise in Cr likely related to a cardiorenal picture. I am suspecting that there may actually just be a component of just progression of disease. Would accept a degree of Cr rise in the context of aggressive diuresis -follows with Dr. Johnney Ou ([facility]) -had declined HD in the past and we had discussed this again. She still declines any form of renal replacement therapy. Fortunately, not exhibiting any uremic symptoms which Ms. Lame and I discussed in depth today -If kidney function worsens and/or becomes refractory to diuretics, I would advocate for a palliative care consultation. I discussed this with the patient as a possibility and she is in full agreement of discussing this with palliative care and transition to comfort care if/when the time comes -Continue to monitor daily Cr, Dose meds for GFR<15 -Monitor Daily I/Os, Daily weight  -Maintain MAP>65 for optimal renal perfusion.  -Avoid further nephrotoxins including NSAIDS, Morphine.  Unless absolutely necessary, avoid CT with contrast and/or MRI with gadolinium.     Acute on chronic diastolic heart failure -continue with IV lasix at 183m BID (on torsemide 1033mdaily at home, 15045mor weight gain), her volume status looks okay for now. If needed can trial one dose of metolazone +/- increasing her lasix frequency to TID on 3/26. If she continues to improve, may just need to get her back on torsemide but at a higher dose given her degree of renal dysfunction -consider checking another CXR to see how her vascular congestion is doing if her respiratory status is a concern but not concerning at the moment  Demand Ischemia -troponins peaked, cardio on board  Hypertension: BP acceptable, resume home anti-HTNs  Secondary hyperparathyroidism, CKD-MBD -check PTH, monitor phos. Resume home  calcitriol  Anemia due to chronic disease: -No transfusions, she is a [employer] manager, fast foodeceiving retacrit 35k units q2weeks, last dose on 3/23    Diabetes Mellitus Type 2 with Hyperglycemia -mgmt per primary. Of note, she is chronically anemic so her hba1c may just falsely low   [facility] 04/28/2020 4:34 PM   _____________________________________________________________________________________   History of Present Illness: CorKeziyah Kneale a/an 70 61o. female with a past medical history of CKD 5 (follows with Dr. KruJohnney Ouefuses to be on dialysis), chronic anemia on ESA, chronic diastolic heart failure, hypertension, OSA on CPAP, insulin-dependent DM 2, hyperlipidemia, hypothyroidism [facility].  She reports that her breathing has been worsening for the last 3 weeks.  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  She also reports several weeks of increasing lower extremity edema.  She was directed to the ER by Dr. KruJohnney Ouom the office.  She was found to have vascular congestion on CXR here.  She is currently receiving Lasix 120 mg twice daily.  She reports that her swelling is down in her abdomen is not as distended.  She does report dyspnea on exertion still.  She otherwise denies any fevers, chills, chest pain, nausea, vomiting, loss of appetite, dysgeusia, brain fog, hiccups, pruritus, tremors.   Medications:  Current Facility-Administered Medications  Medication Dose Route Frequency Provider Last Rate Last Admin  . acetaminophen (TYLENOL) tablet 650 mg  650 mg Oral Q8H PRN Tu, Ching T, DO   650 mg at 04/28/20 1039  . allopurinol (ZYLOPRIM) tablet 100 mg  100  mg Oral BID Tu, Ching T, DO   100 mg at 04/28/20 1040  . amLODipine (NORVASC) tablet 10 mg  10 mg Oral BID Tu, Ching T, DO   10 mg at 04/28/20 1040  . calcitRIOL (ROCALTROL) capsule 0.5 mcg  0.5 mcg Oral Daily Tu, Ching T, DO    0.5 mcg at 04/28/20 1039  . carvedilol (COREG) tablet 6.25 mg  6.25 mg Oral BID WC Tu, Ching T, DO   6.25 mg at 04/28/20 1040  . cloNIDine (CATAPRES - Dosed in mg/24 hr) patch 0.3 mg  0.3 mg Transdermal Q Fri Tu, Ching T, DO   0.3 mg at 04/28/20 1047  . furosemide (LASIX) injection 120 mg  120 mg Intravenous BID Buford Dresser, MD   120 mg at 04/28/20 0736  . heparin injection 5,000 Units  5,000 Units Subcutaneous Q8H Tu, Ching T, DO   5,000 Units at 04/28/20 1416  . hydrALAZINE (APRESOLINE) tablet 50 mg  50 mg Oral TID Tommie Raymond, NP   50 mg at 04/28/20 1549  . insulin aspart (novoLOG) injection 0-15 Units  0-15 Units Subcutaneous TID WC Tu, Ching T, DO   3 Units at 04/28/20 1209  . insulin aspart (novoLOG) injection 0-5 Units  0-5 Units Subcutaneous QHS Tu, Ching T, DO      . insulin glargine (LANTUS) injection 10 Units  10 Units Subcutaneous QHS Dessa Phi, DO   10 Units at 04/27/20 2358  . isosorbide mononitrate (IMDUR) 24 hr tablet 30 mg  30 mg Oral Daily Tu, Ching T, DO   30 mg at 04/28/20 1039  . levothyroxine (SYNTHROID) tablet 200 mcg  200 mcg Oral Q0600 Dessa Phi, DO   200 mcg at 04/28/20 4132  . omega-3 acid ethyl esters (LOVAZA) capsule 1 g  1 g Oral Daily Tu, Ching T, DO   1 g at 04/28/20 1039  . polyethylene glycol (MIRALAX / GLYCOLAX) packet 17 g  17 g Oral Daily PRN Tu, Ching T, DO      . rosuvastatin (CRESTOR) tablet 10 mg  10 mg Oral QPM Tu, Ching T, DO   10 mg at 04/27/20 1841  . zolpidem (AMBIEN) tablet 5 mg  5 mg Oral QHS PRN Tu, Ching T, DO   5 mg at 04/28/20 0016     ALLERGIES Patient has no known allergies.  MEDICAL HISTORY Past Medical History:  Diagnosis Date  . ALLERGIC RHINITIS   . Arthritis   . CHF (congestive heart failure) ([place])   . Chronic diastolic heart failure ([place]) 05/05/2013   Echo 03/2018:  EF 44-01, +diastolic dysfunction, GLS -20.5%, normal RVSF, RVSP 42.5 (mild elevated), severe LAE, small pericardial eff, mild MAc, mod AV calc,  mild PI  . CKD (chronic kidney disease)   . Diabetes mellitus   . Dyspnea   . Heart murmur    at birth  . Hyperlipidemia   . Hypertension   . Hypothyroid   . Iron deficiency anemia   . Obesity   . OSA on CPAP   . Previous back surgery    x 2  . Refusal of blood transfusions as patient is Jehovah's Witness   . S/P arthroscopy      SOCIAL HISTORY Social History   Socioeconomic History  . Marital status: Single    Spouse name: Not on file  . Number of children: Not on file  . Years of education: Not on file  . Highest education level: Not on file  Occupational  History  . Occupation: school bus driver  Tobacco Use  . Smoking status: Never Smoker  . Smokeless tobacco: Never Used  Vaping Use  . Vaping Use: Never used  Substance and Sexual Activity  . Alcohol use: Yes    Alcohol/week: 0.0 standard drinks    Comment: rare  . Drug use: No  . Sexual activity: Not Currently    Birth control/protection: Surgical  Other Topics Concern  . Not on file  Social History Narrative  . Not on file   Social Determinants of Health   Financial Resource Strain: Not on file  Food Insecurity: Not on file  Transportation Needs: Not on file  Physical Activity: Not on file  Stress: Not on file  Social Connections: Not on file  Intimate Partner Violence: Not on file     FAMILY HISTORY Family History  Problem Relation Age of Onset  . Prostate cancer Father   . Congestive Heart Failure Mother   . Diabetes Sister   . Other Sister        septis  . Breast cancer Neg Hx      Review of Systems: 12 systems reviewed Otherwise as per HPI, all other systems reviewed and negative  Physical Exam: Vitals:   04/28/20 1200 04/28/20 1549  BP: (!) 154/75 137/67  Pulse: (!) 59   Resp: 16   Temp: 98.2 F (36.8 C)   SpO2: 100%    Total I/O In: 360 [P.O.:360] Out: 1700 [Urine:1700]  Intake/Output Summary (Last 24 hours) at 04/28/2020 1634 Last data filed at 04/28/2020 1550 Gross per  24 hour  Intake 840 ml  Output 4000 ml  Net -3160 ml   General: well-appearing, no acute distress, sitting up in bed HEENT: anicteric sclera, oropharynx clear without lesions CV: regular rate, normal rhythm, no murmurs, no gallops, no rubs Lungs: CTA bilaterally, normal work of breathing/unlabored, speaking full sentences, no accessory muscle use Abd: soft, non-tender, non-distended Skin: no visible lesions or rashes Psych: alert, engaged, appropriate mood and affect Musculoskeletal: 1-2+ pitting edema bilateral lower extremities Neuro: normal speech, no gross focal deficits, no asterixis  Test Results Reviewed Lab Results  Component Value Date   NA 140 04/28/2020   K 4.0 04/28/2020   CL 106 04/28/2020   CO2 23 04/28/2020   BUN 125 (H) 04/28/2020   CREATININE 4.22 (H) 04/28/2020   CALCIUM 9.0 04/28/2020   ALBUMIN 3.5 04/26/2020   PHOS 5.5 (H) 03/29/2020     I have reviewed all relevant outside healthcare records related to the patient's kidney injury.

## 2020-04-28 NOTE — Progress Notes (Signed)
Progress Note  Patient Name: Tiffany [name] Date of Encounter: 04/28/2020  Primary Cardiologist: Dr. Daneen Schick, MD  Subjective   Doing well, no specific complaints today.   Inpatient Medications    Scheduled Meds: . allopurinol  100 mg Oral BID  . amLODipine  10 mg Oral BID  . calcitRIOL  0.5 mcg Oral Daily  . carvedilol  6.25 mg Oral BID WC  . cloNIDine  0.3 mg Transdermal Q Fri  . furosemide  120 mg Intravenous BID  . heparin  5,000 Units Subcutaneous Q8H  . hydrALAZINE  25 mg Oral TID  . insulin aspart  0-15 Units Subcutaneous TID WC  . insulin aspart  0-5 Units Subcutaneous QHS  . insulin glargine  10 Units Subcutaneous QHS  . isosorbide mononitrate  30 mg Oral Daily  . levothyroxine  200 mcg Oral Q0600  . omega-3 acid ethyl esters  1 g Oral Daily  . rosuvastatin  10 mg Oral QPM   Continuous Infusions:  PRN Meds: acetaminophen, polyethylene glycol, zolpidem   Vital Signs    Vitals:   04/28/20 0116 04/28/20 0213 04/28/20 0524 04/28/20 0746  BP: 136/62  (!) 154/66 (!) 164/82  Pulse: (!) 58  (!) 55 (!) 54  Resp: 20  18 20   Temp: 98.2 F (36.8 C)  98 F (36.7 C) 97.7 F (36.5 C)  TempSrc: Oral  Oral Oral  SpO2: 100%  100% 99%  Weight:  113.2 kg    Height:        Intake/Output Summary (Last 24 hours) at 04/28/2020 0935 Last data filed at 04/28/2020 0526 Gross per 24 hour  Intake 680 ml  Output 3700 ml  Net -3020 ml   Filed Weights   04/26/20 1850 04/27/20 1354 04/28/20 0213  Weight: 113.4 kg 113.8 kg 113.2 kg    Physical Exam   General: Well developed, well nourished, NAD Neck: Negative for carotid bruits. No JVD Lungs:Clear to ausculation bilaterally. No wheezes, rales, or rhonchi. Breathing is unlabored. Cardiovascular: RRR with S1 S2. + murmur Abdomen: Soft, non-tender, non-distended. No obvious abdominal masses. Extremities: BLE foot to knee 1-2+ edema. Radial pulses 2+ bilaterally Neuro: Alert and oriented. No focal deficits. No  facial asymmetry. MAE spontaneously. Psych: Responds to questions appropriately with normal affect.    Labs    Chemistry Recent Labs  Lab 04/26/20 1724 04/26/20 1746 04/27/20 0456 04/28/20 0414  NA 140  --  139 140  K 4.6  --  4.4 4.0  CL 107  --  107 106  CO2 21*  --  22 23  GLUCOSE 171*  --  149* 113*  BUN 131*  --  129* 125*  CREATININE 4.10*  --  4.14* 4.22*  CALCIUM 9.6  --  9.7 9.0  PROT  --  6.4*  --   --   ALBUMIN  --  3.5  --   --   AST  --  27  --   --   ALT  --  29  --   --   ALKPHOS  --  112  --   --   BILITOT  --  0.5  --   --   GFRNONAA 11*  --  11* 11*  ANIONGAP 12  --  10 11     Hematology Recent Labs  Lab 04/26/20 0950 04/26/20 1724 04/27/20 0456  WBC  --  4.0 4.2  RBC  --  3.56* 3.69*  HGB 10.2* 9.5* 9.8*  HCT  --  30.6* 31.5*  MCV  --  86.0 85.4  MCH  --  26.7 26.6  MCHC  --  31.0 31.1  RDW  --  19.9* 19.9*  PLT  --  111* 111*    Cardiac EnzymesNo results for input(s): TROPONINI in the last 168 hours. No results for input(s): TROPIPOC in the last 168 hours.   BNP Recent Labs  Lab 04/26/20 1746  BNP 322.2*     DDimer No results for input(s): DDIMER in the last 168 hours.   Radiology    DG Chest 2 View  Result Date: 04/26/2020 CLINICAL DATA:  Shortness of breath EXAM: CHEST - 2 VIEW COMPARISON:  04/17/2020 FINDINGS: Cardiomegaly, vascular congestion. No overt edema. No confluent opacities or effusions. No acute bony abnormality. IMPRESSION: Cardiomegaly, vascular congestion. Electronically Signed   By: Rolm Baptise M.D.   On: 04/26/2020 19:54   ECHOCARDIOGRAM COMPLETE  Result Date: 04/27/2020    ECHOCARDIOGRAM REPORT   Patient Name:   [facility] Date of Exam: 04/27/2020 Medical Rec #:  [id number]         Height:       70.0 in Accession #:    [id number]        Weight:       250.0 lb Date of Birth:  [birth year]          BSA:          2.295 m Patient Age:    70 years          BP:           150/69 mmHg Patient Gender: F                  HR:           63 bpm. Exam Location:  Inpatient Procedure: 2D Echo, Cardiac Doppler and Color Doppler Indications:     CHF  History:         Patient has prior history of Echocardiogram examinations, most                  recent 02/02/2020. CHF, Signs/Symptoms:Shortness of Breath;                  Risk Factors:Diabetes, Hypertension and Dyslipidemia. CKD.  Sonographer:     Dustin Flock Referring Phys:  [id number] [place] [facility] Diagnosing Phys: Ena Dawley MD IMPRESSIONS  1. Left ventricular ejection fraction, by estimation, is 65 to 70%. The left ventricle has normal function. The left ventricle has no regional wall motion abnormalities. There is severe concentric left ventricular hypertrophy. Left ventricular diastolic  parameters are consistent with Grade II diastolic dysfunction (pseudonormalization). Elevated left atrial pressure.  2. Right ventricular systolic function is normal. The right ventricular size is normal. There is mildly elevated pulmonary artery systolic pressure. The estimated right ventricular systolic pressure is 51.0 mmHg.  3. Left atrial size was moderately dilated.  4. Right atrial size was mildly dilated.  5. A small pericardial effusion is present. The pericardial effusion is circumferential. There is no evidence of cardiac tamponade.  6. The mitral valve is normal in structure. Mild mitral valve regurgitation. Mild mitral stenosis. Severe mitral annular calcification.  7. The aortic valve is normal in structure. There is moderate calcification of the aortic valve. There is moderate thickening of the aortic valve. Aortic valve regurgitation is not visualized. Mild to moderate aortic valve sclerosis/calcification is present, without any evidence of aortic stenosis.  8.  The inferior vena cava is dilated in size with <50% respiratory variability, suggesting right atrial pressure of 15 mmHg. FINDINGS  Left Ventricle: Left ventricular ejection fraction, by estimation, is 65 to 70%. The left  ventricle has normal function. The left ventricle has no regional wall motion abnormalities. The left ventricular internal cavity size was normal in size. There is  severe concentric left ventricular hypertrophy. Left ventricular diastolic parameters are consistent with Grade II diastolic dysfunction (pseudonormalization). Elevated left atrial pressure. Right Ventricle: The right ventricular size is normal. No increase in right ventricular wall thickness. Right ventricular systolic function is normal. There is mildly elevated pulmonary artery systolic pressure. The tricuspid regurgitant velocity is 2.57  m/s, and with an assumed right atrial pressure of 15 mmHg, the estimated right ventricular systolic pressure is 61.6 mmHg. Left Atrium: Left atrial size was moderately dilated. Right Atrium: Right atrial size was mildly dilated. Pericardium: A small pericardial effusion is present. The pericardial effusion is circumferential. There is no evidence of cardiac tamponade. Mitral Valve: The mitral valve is normal in structure. There is mild thickening of the mitral valve leaflet(s). There is mild calcification of the mitral valve leaflet(s). Severe mitral annular calcification. Mild mitral valve regurgitation. Mild mitral valve stenosis. MV peak gradient, 12.7 mmHg. The mean mitral valve gradient is 6.0 mmHg with average heart rate of 57 bpm. Tricuspid Valve: The tricuspid valve is normal in structure. Tricuspid valve regurgitation is mild . No evidence of tricuspid stenosis. Aortic Valve: The aortic valve is normal in structure. There is moderate calcification of the aortic valve. There is moderate thickening of the aortic valve. Aortic valve regurgitation is not visualized. Mild to moderate aortic valve sclerosis/calcification is present, without any evidence of aortic stenosis. Aortic valve mean gradient measures 13.0 mmHg. Aortic valve peak gradient measures 21.7 mmHg. Aortic valve area, by VTI measures 2.62 cm.  Pulmonic Valve: The pulmonic valve was normal in structure. Pulmonic valve regurgitation is mild. No evidence of pulmonic stenosis. Aorta: The aortic root is normal in size and structure. Venous: The inferior vena cava is dilated in size with less than 50% respiratory variability, suggesting right atrial pressure of 15 mmHg. IAS/Shunts: No atrial level shunt detected by color flow Doppler.  LEFT VENTRICLE PLAX 2D LVIDd:         4.50 cm      Diastology LVIDs:         2.60 cm      LV e' medial:    6.31 cm/s LV PW:         1.80 cm      LV E/e' medial:  27.9 LV IVS:        1.90 cm      LV e' lateral:   6.20 cm/s LVOT diam:     2.20 cm      LV E/e' lateral: 28.4 LV SV:         147 LV SV Index:   64 LVOT Area:     3.80 cm  LV Volumes (MOD) LV vol d, MOD A4C: 113.0 ml LV vol s, MOD A4C: 37.7 ml LV SV MOD A4C:     113.0 ml RIGHT VENTRICLE RV Basal diam:  4.20 cm RV S prime:     17.30 cm/s TAPSE (M-mode): 3.0 cm LEFT ATRIUM              Index       RIGHT ATRIUM           Index LA  diam:        4.80 cm  2.09 cm/m  RA Area:     26.90 cm LA Vol (A2C):   118.0 ml 51.42 ml/m RA Volume:   85.60 ml  37.30 ml/m LA Vol (A4C):   102.0 ml 44.45 ml/m LA Biplane Vol: 114.0 ml 49.67 ml/m  AORTIC VALVE AV Area (Vmax):    2.84 cm AV Area (Vmean):   2.74 cm AV Area (VTI):     2.62 cm AV Vmax:           233.00 cm/s AV Vmean:          168.000 cm/s AV VTI:            0.562 m AV Peak Grad:      21.7 mmHg AV Mean Grad:      13.0 mmHg LVOT Vmax:         174.00 cm/s LVOT Vmean:        121.000 cm/s LVOT VTI:          0.388 m LVOT/AV VTI ratio: 0.69  AORTA Ao Root diam: 3.30 cm MITRAL VALVE                TRICUSPID VALVE MV Area (PHT): 1.70 cm     TR Peak grad:   26.4 mmHg MV Area VTI:   2.03 cm     TR Vmax:        257.00 cm/s MV Peak grad:  12.7 mmHg MV Mean grad:  6.0 mmHg     SHUNTS MV Vmax:       1.78 m/s     Systemic VTI:  0.39 m MV Vmean:      109.5 cm/s   Systemic Diam: 2.20 cm MV Decel Time: 447 msec MV E velocity: 176.00 cm/s MV A  velocity: 166.00 cm/s MV E/A ratio:  1.06 Ena Dawley MD Electronically signed by Ena Dawley MD Signature Date/Time: 04/27/2020/12:37:22 PM    Final (Updated)    Telemetry    04/28/20 NSR/SB Personally Reviewed  ECG    No new tracing as of 04/28/20 Personally Reviewed  Cardiac Studies   Echocardiogram 04/27/20:  1. Left ventricular ejection fraction, by estimation, is 65 to 70%. The  left ventricle has normal function. The left ventricle has no regional  wall motion abnormalities. There is severe concentric left ventricular  hypertrophy. Left ventricular diastolic  parameters are consistent with Grade II diastolic dysfunction  (pseudonormalization). Elevated left atrial pressure.  2. Right ventricular systolic function is normal. The right ventricular  size is normal. There is mildly elevated pulmonary artery systolic  pressure. The estimated right ventricular systolic pressure is 52.8 mmHg.  3. Left atrial size was moderately dilated.  4. Right atrial size was mildly dilated.  5. A small pericardial effusion is present. The pericardial effusion is  circumferential. There is no evidence of cardiac tamponade.  6. The mitral valve is normal in structure. Mild mitral valve  regurgitation. Mild mitral stenosis. Severe mitral annular calcification.  7. The aortic valve is normal in structure. There is moderate  calcification of the aortic valve. There is moderate thickening of the  aortic valve. Aortic valve regurgitation is not visualized. Mild to  moderate aortic valve sclerosis/calcification is  present, without any evidence of aortic stenosis.  8. The inferior vena cava is dilated in size with <50% respiratory  variability, suggesting right atrial pressure of 15 mmHg.   Echocardiogram 02/02/2020:  1. Left ventricular ejection fraction, by estimation, is  65 to 70%. The  left ventricle has normal function. The left ventricle has no regional  wall motion  abnormalities. There is severe left ventricular hypertrophy.  Left ventricular diastolic parameters  are consistent with Grade II diastolic dysfunction (pseudonormalization).  Elevated left atrial pressure.  2. Right ventricular systolic function is normal. The right ventricular  size is normal. There is mildly elevated pulmonary artery systolic  pressure. The estimated right ventricular systolic pressure is 35.3 mmHg.  3. A small pericardial effusion is present.  4. Left atrial size was severely dilated.  5. The mitral valve is abnormal. Severe mitral annular calcification.  Trivial mitral valve regurgitation. Moderate mitral stenosis. MG 8 mmHg at  HR 65 bpm, MVA 1.5 cm^2 by continuity equation  6. The aortic valve is tricuspid. There is moderate thickening of the  aortic valve. Aortic valve regurgitation is not visualized. Mild aortic  valve stenosis. Vmax 2.9 m/s, MG 81mmHg, AVA 1.9 cm^2, DI 0.6  7. The inferior vena cava is dilated in size with <50% respiratory  variability, suggesting right atrial pressure of 15 mmHg.   Gated nuclear stress test 01/26/2020:   The left ventricular ejection fraction is hyperdynamic (>65%).  Nuclear stress EF: 67%.  There was no ST segment deviation noted during stress.  No T wave inversion was noted during stress.  The study is normal.  This is a low risk study.   Patient Profile     70 y.o. female with a hx of a history of chronic diastolic CHF, hypertension, OSA on CPAP, DM2, CKD stage V who has refused dialysis in the past, anemia of chronic disease, HLD and hypothyroidism who is being seen today for the evaluation of CHF at the request of Dr. Maylene Roes.  Assessment & Plan    1.  Acute on chronic diastolic CHF: -Patient followed by Dr. Tamala Julian for her cardiology care.  She has a history of frequent ED admissions for CHF exacerbation.  She was most recently seen in the ED for similar symptoms 04/17/2020 treated with alternating  torsemide dosing between 100/150 mg daily. She presented with worsening shortness of breath found to have a BNP with CXR consistent with CHF exacerbation. -Creatinine elevated above baseline at 4.14>>>4.22 -Weight, 249lb -I&O, net -3.6L -Repeat echocardiogram 04/27/20 with EF normal, severe LVH, grade 2 diastolic dysfunction, RVSP 41 mmHg, small pericardial effusion, aortic sclerosis  -Recommend nephrology consultation for diuretic management  -Continue with IV Lasix 120mg  BID for now until further recs per nephrology   2.  CKD stage V: -Creatinine, 4.22>>>baseline which appears to be in the 3.0 range -Deferred HD in the past>>follows with outpatient nephrology>>would consult for diuretic assistance   3.  HTN:  -Labile, 164/82>>154/66>>136/62 -Continue current regimen with amlodipine 10, carvedilol 6.25, clonidine patch 0.3 mg, hydralazine 25TID, Imdur 30 -No room to increase carvedilol due to bradycardia  -Increase hydralazine to 50mg  TID   4.  HLD: -Treated with Lovaza>>continue   5.  DM2: -HbA1c, 6.2 -SSI for glucose control while inpatient status -Continue Lantus -Management per IM  Other hospital problems include: -OSA -Hypothyroidism  Signed, Kathyrn Drown NP-C [place] Pager: [phone] 04/28/2020, 9:35 AM     For questions or updates, please contact   Please consult [email or web address] for contact info under Cardiology/STEMI.

## 2020-04-28 NOTE — Progress Notes (Signed)
PROGRESS NOTE    Tiffany [name]  WRU:[id number] DOB: 01/28/1951 DOA: 04/26/2020 PCP: Nolene Ebbs, MD     Brief Narrative:  Tiffany [name] is a 70 year old female with past medical history significant for chronic diastolic heart failure, hypertension, OSA on CPAP, insulin-dependent type 2 diabetes, hypothyroidism, CKD stage V and has refused dialysis in the past, anemia of chronic disease, hyperlipidemia who presents to the hospital with worsening shortness of breath.  She states that for the past several weeks, she has been having worsening shortness of breath with exertion, chest tightness, has been sleeping with 4 pillows, with peripheral edema.  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  She was evaluated in the emergency department on 3/14, received one-time dose of IV Lasix, increased home torsemide dose and discharged home.  She was also hospitalized in December for heart failure exacerbation.  New events last 24 hours / Subjective: Patient states that her breathing has improved.  Still has peripheral edema.  Has been able to get up to the bedside commode, urine output 3.7 L in the last 24 hours.  She denies any chest pain.  Assessment & Plan:   Principal Problem:   Acute on chronic diastolic (congestive) heart failure (HCC) Active Problems:   Hypothyroidism   Obstructive sleep apnea   Essential hypertension   Chronic kidney disease, stage V (HCC)   Hyperlipidemia   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)   Acute on chronic diastolic heart failure -BNP 322.2 -Hold home torsemide -Repeat echocardiogram with EF 65 to 91%, grade 2 diastolic dysfunction -Continue IV Lasix -Strict I's and O's, daily weight -Cardiology following  AKI on CKD stage V -Baseline creatinine around 3.5-4 -Patient was evaluated by nephrology in the past, patient has refused dialysis.  No urgent dialysis needed at  this time -Nephrology consulted, spoke with Dr. Candiss Norse today  Demand ischemia -Troponin 99 --> 80 -Secondary to above  Hypothyroidism -TSH elevated.  Free T4 elevated -Continue Synthroid, decreased Synthroid dose  -Follows with endocrinology as an outpatient  Type 2 diabetes, insulin-dependent, well controlled -Hemoglobin A1c 6.3 -Continue Lantus, sliding scale insulin  Hypertension -Continue amlodipine, Coreg, clonidine, hydralazine, imdur   Hyperlipidemia -Continue crestor, lovaza   OSA -CPAP nightly   DVT prophylaxis:  heparin injection 5,000 Units Start: 04/26/20 2200  Code Status: DNR Family Communication: No family at bedside Disposition Plan:  Status is: Inpatient  Remains inpatient appropriate because:IV treatments appropriate due to intensity of illness or inability to take PO and Inpatient level of care appropriate due to severity of illness   Dispo: The patient is from: Home              Anticipated d/c is to: Home              Patient currently is not medically stable to d/c.  Continue IV Lasix.  Cardiology following, nephrology consulted   Difficult to place patient No      Consultants:   Cardiology  Nephrology   Antimicrobials:  Anti-infectives (From admission, onward)   None       Objective: Vitals:   04/28/20 0116 04/28/20 0213 04/28/20 0524 04/28/20 0746  BP: 136/62  (!) 154/66 (!) 164/82  Pulse: (!) 58  (!) 55 (!) 54  Resp: 20  18 20   Temp: 98.2 F (36.8 C)  98 F (36.7 C) 97.7 F (36.5 C)  TempSrc: Oral  Oral Oral  SpO2: 100%  100% 99%  Weight:  113.2 kg    Height:        Intake/Output Summary (Last 24 hours) at 04/28/2020 1029 Last data filed at 04/28/2020 1013 Gross per 24 hour  Intake 920 ml  Output 4900 ml  Net -3980 ml   Filed Weights   04/26/20 1850 04/27/20 1354 04/28/20 0213  Weight: 113.4 kg 113.8 kg 113.2 kg    Examination: General exam: Appears calm and comfortable  Respiratory system: Clear to  auscultation. Respiratory effort normal.  On room air Cardiovascular system: S1 & S2 heard, RRR.  Bilateral pedal edema. Gastrointestinal system: Abdomen is nondistended, soft and nontender. Normal bowel sounds heard. Central nervous system: Alert and oriented. Non focal exam. Speech clear  Extremities: Symmetric in appearance bilaterally  Skin: No rashes, lesions or ulcers on exposed skin  Psychiatry: Judgement and insight appear stable. Mood & affect appropriate.    Data Reviewed: I have personally reviewed following labs and imaging studies  CBC: Recent Labs  Lab 04/26/20 0950 04/26/20 1724 04/27/20 0456  WBC  --  4.0 4.2  HGB 10.2* 9.5* 9.8*  HCT  --  30.6* 31.5*  MCV  --  86.0 85.4  PLT  --  111* 191*   Basic Metabolic Panel: Recent Labs  Lab 04/26/20 1724 04/27/20 0456 04/28/20 0414  NA 140 139 140  K 4.6 4.4 4.0  CL 107 107 106  CO2 21* 22 23  GLUCOSE 171* 149* 113*  BUN 131* 129* 125*  CREATININE 4.10* 4.14* 4.22*  CALCIUM 9.6 9.7 9.0   GFR: Estimated Creatinine Clearance: 17.2 mL/min (A) (by C-G formula based on SCr of 4.22 mg/dL (H)). Liver Function Tests: Recent Labs  Lab 04/26/20 1746  AST 27  ALT 29  ALKPHOS 112  BILITOT 0.5  PROT 6.4*  ALBUMIN 3.5   No results for input(s): LIPASE, AMYLASE in the last 168 hours. No results for input(s): AMMONIA in the last 168 hours. Coagulation Profile: No results for input(s): INR, PROTIME in the last 168 hours. Cardiac Enzymes: No results for input(s): CKTOTAL, CKMB, CKMBINDEX, TROPONINI in the last 168 hours. BNP (last 3 results) Recent Labs    01/25/20 1506  PROBNP 2,507*   HbA1C: Recent Labs    04/26/20 2150  HGBA1C 6.3*   CBG: Recent Labs  Lab 04/27/20 0759 04/27/20 1249 04/27/20 1636 04/27/20 2107 04/28/20 0601  GLUCAP 173* 142* 152* 153* 113*   Lipid Profile: No results for input(s): CHOL, HDL, LDLCALC, TRIG, CHOLHDL, LDLDIRECT in the last 72 hours. Thyroid Function Tests: Recent  Labs    04/26/20 1750 04/27/20 0914  TSH 10.113*  --   FREET4  --  1.57*   Anemia Panel: No results for input(s): VITAMINB12, FOLATE, FERRITIN, TIBC, IRON, RETICCTPCT in the last 72 hours. Sepsis Labs: No results for input(s): PROCALCITON, LATICACIDVEN in the last 168 hours.  Recent Results (from the past 240 hour(s))  Resp Panel by RT-PCR (Flu A&B, Covid) Nasopharyngeal Swab     Status: None   Collection Time: 04/26/20  8:42 PM   Specimen: Nasopharyngeal Swab; Nasopharyngeal(NP) swabs in vial transport medium  Result Value Ref Range Status   SARS Coronavirus 2 by RT PCR NEGATIVE NEGATIVE Final    Comment: (NOTE) SARS-CoV-2 target nucleic acids are NOT DETECTED.  The SARS-CoV-2 RNA is generally detectable in upper respiratory specimens during the acute phase of infection. The lowest concentration of SARS-CoV-2 viral copies this assay can detect  is 138 copies/mL. A negative result does not preclude SARS-Cov-2 infection and should not be used as the sole basis for treatment or other patient management decisions. A negative result may occur with  improper specimen collection/handling, submission of specimen other than nasopharyngeal swab, presence of viral mutation(s) within the areas targeted by this assay, and inadequate number of viral copies(<138 copies/mL). A negative result must be combined with clinical observations, patient history, and epidemiological information. The expected result is Negative.  Fact Sheet for Patients:  EntrepreneurPulse.com.au  Fact Sheet for Healthcare Providers:  IncredibleEmployment.be  This test is no t yet approved or cleared by the [place] FDA and  has been authorized for detection and/or diagnosis of SARS-CoV-2 by FDA under an Emergency Use Authorization (EUA). This EUA will remain  in effect (meaning this test can be used) for the duration of the COVID-19 declaration under Section 564(b)(1) of the  Act, 21 U.S.C.section 360bbb-3(b)(1), unless the authorization is terminated  or revoked sooner.       Influenza A by PCR NEGATIVE NEGATIVE Final   Influenza B by PCR NEGATIVE NEGATIVE Final    Comment: (NOTE) The Xpert Xpress SARS-CoV-2/FLU/RSV plus assay is intended as an aid in the diagnosis of influenza from Nasopharyngeal swab specimens and should not be used as a sole basis for treatment. Nasal washings and aspirates are unacceptable for Xpert Xpress SARS-CoV-2/FLU/RSV testing.  Fact Sheet for Patients: EntrepreneurPulse.com.au  Fact Sheet for Healthcare Providers: IncredibleEmployment.be  This test is not yet approved or cleared by the [place] FDA and has been authorized for detection and/or diagnosis of SARS-CoV-2 by FDA under an Emergency Use Authorization (EUA). This EUA will remain in effect (meaning this test can be used) for the duration of the COVID-19 declaration under Section 564(b)(1) of the Act, 21 U.S.C. section 360bbb-3(b)(1), unless the authorization is terminated or revoked.  Performed at [facility], [place] [address] [place], [place] [address]       Radiology Studies: DG Chest 2 View  Result Date: 04/26/2020 CLINICAL DATA:  Shortness of breath EXAM: CHEST - 2 VIEW COMPARISON:  04/17/2020 FINDINGS: Cardiomegaly, vascular congestion. No overt edema. No confluent opacities or effusions. No acute bony abnormality. IMPRESSION: Cardiomegaly, vascular congestion. Electronically Signed   By: Rolm Baptise M.D.   On: 04/26/2020 19:54   ECHOCARDIOGRAM COMPLETE  Result Date: 04/27/2020    ECHOCARDIOGRAM REPORT   Patient Name:   [facility] Date of Exam: 04/27/2020 Medical Rec #:  [id number]         Height:       70.0 in Accession #:    [id number]        Weight:       250.0 lb Date of Birth:  12-29-1950          BSA:          2.295 m Patient Age:    74 years          BP:           150/69 mmHg Patient Gender: F                  HR:           63 bpm. Exam Location:  Inpatient Procedure: 2D Echo, Cardiac Doppler and Color Doppler Indications:     CHF  History:         Patient has prior history of Echocardiogram examinations, most  recent 02/02/2020. CHF, Signs/Symptoms:Shortness of Breath;                  Risk Factors:Diabetes, Hypertension and Dyslipidemia. CKD.  Sonographer:     Dustin Flock Referring Phys:  [id number] [place] [facility] Diagnosing Phys: Ena Dawley MD IMPRESSIONS  1. Left ventricular ejection fraction, by estimation, is 65 to 70%. The left ventricle has normal function. The left ventricle has no regional wall motion abnormalities. There is severe concentric left ventricular hypertrophy. Left ventricular diastolic  parameters are consistent with Grade II diastolic dysfunction (pseudonormalization). Elevated left atrial pressure.  2. Right ventricular systolic function is normal. The right ventricular size is normal. There is mildly elevated pulmonary artery systolic pressure. The estimated right ventricular systolic pressure is 73.5 mmHg.  3. Left atrial size was moderately dilated.  4. Right atrial size was mildly dilated.  5. A small pericardial effusion is present. The pericardial effusion is circumferential. There is no evidence of cardiac tamponade.  6. The mitral valve is normal in structure. Mild mitral valve regurgitation. Mild mitral stenosis. Severe mitral annular calcification.  7. The aortic valve is normal in structure. There is moderate calcification of the aortic valve. There is moderate thickening of the aortic valve. Aortic valve regurgitation is not visualized. Mild to moderate aortic valve sclerosis/calcification is present, without any evidence of aortic stenosis.  8. The inferior vena cava is dilated in size with <50% respiratory variability, suggesting right atrial pressure of 15 mmHg. FINDINGS  Left Ventricle: Left ventricular ejection fraction, by estimation, is 65 to 70%.  The left ventricle has normal function. The left ventricle has no regional wall motion abnormalities. The left ventricular internal cavity size was normal in size. There is  severe concentric left ventricular hypertrophy. Left ventricular diastolic parameters are consistent with Grade II diastolic dysfunction (pseudonormalization). Elevated left atrial pressure. Right Ventricle: The right ventricular size is normal. No increase in right ventricular wall thickness. Right ventricular systolic function is normal. There is mildly elevated pulmonary artery systolic pressure. The tricuspid regurgitant velocity is 2.57  m/s, and with an assumed right atrial pressure of 15 mmHg, the estimated right ventricular systolic pressure is 32.9 mmHg. Left Atrium: Left atrial size was moderately dilated. Right Atrium: Right atrial size was mildly dilated. Pericardium: A small pericardial effusion is present. The pericardial effusion is circumferential. There is no evidence of cardiac tamponade. Mitral Valve: The mitral valve is normal in structure. There is mild thickening of the mitral valve leaflet(s). There is mild calcification of the mitral valve leaflet(s). Severe mitral annular calcification. Mild mitral valve regurgitation. Mild mitral valve stenosis. MV peak gradient, 12.7 mmHg. The mean mitral valve gradient is 6.0 mmHg with average heart rate of 57 bpm. Tricuspid Valve: The tricuspid valve is normal in structure. Tricuspid valve regurgitation is mild . No evidence of tricuspid stenosis. Aortic Valve: The aortic valve is normal in structure. There is moderate calcification of the aortic valve. There is moderate thickening of the aortic valve. Aortic valve regurgitation is not visualized. Mild to moderate aortic valve sclerosis/calcification is present, without any evidence of aortic stenosis. Aortic valve mean gradient measures 13.0 mmHg. Aortic valve peak gradient measures 21.7 mmHg. Aortic valve area, by VTI measures 2.62  cm. Pulmonic Valve: The pulmonic valve was normal in structure. Pulmonic valve regurgitation is mild. No evidence of pulmonic stenosis. Aorta: The aortic root is normal in size and structure. Venous: The inferior vena cava is dilated in size with less than 50% respiratory variability,  suggesting right atrial pressure of 15 mmHg. IAS/Shunts: No atrial level shunt detected by color flow Doppler.  LEFT VENTRICLE PLAX 2D LVIDd:         4.50 cm      Diastology LVIDs:         2.60 cm      LV e' medial:    6.31 cm/s LV PW:         1.80 cm      LV E/e' medial:  27.9 LV IVS:        1.90 cm      LV e' lateral:   6.20 cm/s LVOT diam:     2.20 cm      LV E/e' lateral: 28.4 LV SV:         147 LV SV Index:   64 LVOT Area:     3.80 cm  LV Volumes (MOD) LV vol d, MOD A4C: 113.0 ml LV vol s, MOD A4C: 37.7 ml LV SV MOD A4C:     113.0 ml RIGHT VENTRICLE RV Basal diam:  4.20 cm RV S prime:     17.30 cm/s TAPSE (M-mode): 3.0 cm LEFT ATRIUM              Index       RIGHT ATRIUM           Index LA diam:        4.80 cm  2.09 cm/m  RA Area:     26.90 cm LA Vol (A2C):   118.0 ml 51.42 ml/m RA Volume:   85.60 ml  37.30 ml/m LA Vol (A4C):   102.0 ml 44.45 ml/m LA Biplane Vol: 114.0 ml 49.67 ml/m  AORTIC VALVE AV Area (Vmax):    2.84 cm AV Area (Vmean):   2.74 cm AV Area (VTI):     2.62 cm AV Vmax:           233.00 cm/s AV Vmean:          168.000 cm/s AV VTI:            0.562 m AV Peak Grad:      21.7 mmHg AV Mean Grad:      13.0 mmHg LVOT Vmax:         174.00 cm/s LVOT Vmean:        121.000 cm/s LVOT VTI:          0.388 m LVOT/AV VTI ratio: 0.69  AORTA Ao Root diam: 3.30 cm MITRAL VALVE                TRICUSPID VALVE MV Area (PHT): 1.70 cm     TR Peak grad:   26.4 mmHg MV Area VTI:   2.03 cm     TR Vmax:        257.00 cm/s MV Peak grad:  12.7 mmHg MV Mean grad:  6.0 mmHg     SHUNTS MV Vmax:       1.78 m/s     Systemic VTI:  0.39 m MV Vmean:      109.5 cm/s   Systemic Diam: 2.20 cm MV Decel Time: 447 msec MV E velocity: 176.00 cm/s  MV A velocity: 166.00 cm/s MV E/A ratio:  1.06 Ena Dawley MD Electronically signed by Ena Dawley MD Signature Date/Time: 04/27/2020/12:37:22 PM    Final (Updated)       Scheduled Meds: . allopurinol  100 mg Oral BID  . amLODipine  10 mg Oral BID  . calcitRIOL  0.5 mcg Oral Daily  . carvedilol  6.25 mg Oral BID WC  . cloNIDine  0.3 mg Transdermal Q Fri  . furosemide  120 mg Intravenous BID  . heparin  5,000 Units Subcutaneous Q8H  . hydrALAZINE  50 mg Oral TID  . insulin aspart  0-15 Units Subcutaneous TID WC  . insulin aspart  0-5 Units Subcutaneous QHS  . insulin glargine  10 Units Subcutaneous QHS  . isosorbide mononitrate  30 mg Oral Daily  . levothyroxine  200 mcg Oral Q0600  . omega-3 acid ethyl esters  1 g Oral Daily  . rosuvastatin  10 mg Oral QPM   Continuous Infusions:   LOS: 2 days      Time spent: 25 minutes   Dessa Phi, DO [facility] 04/28/2020, 10:29 AM   Available via Epic secure chat 7am-7pm After these hours, please refer to coverage provider listed on [email or web address]

## 2020-04-28 NOTE — Progress Notes (Signed)
  Mobility Specialist Criteria Algorithm Info.   Mobility Team:  [facility] elevated:Self regulated Activity: Ambulated in hall; Dangled on edge of bed Range of motion: Active; All extremities Level of assistance: Standby assist, set-up cues, supervision of patient - no hands on Assistive device: None; Other (Comment) ([place] in [place]) Minutes sitting in chair:  Minutes stood: 5 minutes Minutes ambulated: 5 minutes Distance ambulated (ft): 100 ft Mobility response: Tolerated fair Bed Position: Semi-fowlers  Patient received lying supine in bed, agreed to participate in mobility this morning. Completed education on energy conservation and pursed lip breathing prior to ambulation. Per pt, has been ambulatory but very limited the last 2-3 weeks due to SOB. She stood and ambulated independently in hallway 100 feet with steady gait. Distance limited and progressively gets weaker as dyspnea/wheezing increases. Ambulated 12f before requiring standing rest break, reported feeling very weak but denied dizziness or lightheadedness. Was able to ambulate back to room without incident but required multiple standing rest breaks. Overall pt tolerated fair but was limited secondary to being dyspneic and deconditioned. Is now dangling EOB with all needs met.   04/28/2020 12:27 PM

## 2020-04-29 LAB — GLUCOSE, CAPILLARY
Glucose-Capillary: 99 mg/dL (ref 70–99)
Glucose-Capillary: 172 mg/dL — ABNORMAL HIGH (ref 70–99)
Glucose-Capillary: 207 mg/dL — ABNORMAL HIGH (ref 70–99)
Glucose-Capillary: 151 mg/dL — ABNORMAL HIGH (ref 70–99)

## 2020-04-29 LAB — BASIC METABOLIC PANEL
Anion gap: 9 (ref 5–15)
BUN: 121 mg/dL — ABNORMAL HIGH (ref 8–23)
CO2: 24 mmol/L (ref 22–32)
Calcium: 9.2 mg/dL (ref 8.9–10.3)
Chloride: 105 mmol/L (ref 98–111)
Creatinine, Ser: 4.25 mg/dL — ABNORMAL HIGH (ref 0.44–1.00)
GFR, Estimated: 11 mL/min — ABNORMAL LOW (ref >60)
Glucose, Bld: 162 mg/dL — ABNORMAL HIGH (ref 70–99)
Potassium: 3.8 mmol/L (ref 3.5–5.1)
Sodium: 138 mmol/L (ref 135–145)

## 2020-04-29 MED ORDER — GUAIFENESIN-DM 100-10 MG/5ML PO SYRP: 5.0000 mL | ORAL_SOLUTION | ORAL | Status: DC | PRN

## 2020-04-29 MED ORDER — POLYVINYL ALCOHOL 1.4 % OP SOLN
1.0000 [drp] | OPHTHALMIC | Status: DC | PRN
  Filled 2020-04-29: qty 15

## 2020-04-29 NOTE — Progress Notes (Signed)
PROGRESS NOTE    Tiffany [name]  FUX:[id number] DOB: 1950/02/24 DOA: 04/26/2020 PCP: Nolene Ebbs, MD     Brief Narrative:  Tiffany [name] is a 70 year old female with past medical history significant for chronic diastolic heart failure, hypertension, OSA on CPAP, insulin-dependent type 2 diabetes, hypothyroidism, CKD stage V and has refused dialysis in the past, anemia of chronic disease, hyperlipidemia who presents to the hospital with worsening shortness of breath.  She states that for the past several weeks, she has been having worsening shortness of breath with exertion, chest tightness, has been sleeping with 4 pillows, with peripheral edema.  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  She was evaluated in the emergency department on 3/14, received one-time dose of IV Lasix, increased home torsemide dose and discharged home.  She was also hospitalized in December for heart failure exacerbation. Cardiology and Nephrology consulted to assist with diuresis.   New events last 24 hours / Subjective: Patient has been walking up and down the hallway, but having to stop intermittently due to exertional dyspnea. Still with peripheral edema. Had 3.4L UOP last 24 hours. Denies CP, N/V. Asked for lubricating eye drops.   Assessment & Plan:   Principal Problem:   Acute on chronic diastolic (congestive) heart failure (HCC) Active Problems:   Hypothyroidism   Obstructive sleep apnea   Essential hypertension   Chronic kidney disease, stage V (HCC)   Hyperlipidemia   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)   Acute on chronic diastolic heart failure -BNP 322.2 -Hold home torsemide -Repeat echocardiogram with EF 65 to 02%, grade 2 diastolic dysfunction -Continue IV Lasix -Strict I's and O's, daily weight -Cardiology following  AKI on CKD stage V -Baseline creatinine around 3.5-4 -Patient was evaluated by  nephrology in the past, patient has refused dialysis.  No urgent dialysis needed at this time -Nephrology following   Demand ischemia -Troponin 99 --> 80 -Secondary to above  Hypothyroidism -TSH elevated.  Free T4 elevated -Continue Synthroid, decreased Synthroid dose  -Follows with endocrinology as an outpatient  Type 2 diabetes, insulin-dependent, well controlled -Hemoglobin A1c 6.3 -Continue Lantus, sliding scale insulin  Hypertension -Continue amlodipine, Coreg, clonidine, hydralazine, imdur   Hyperlipidemia -Continue crestor, lovaza   OSA -CPAP nightly   DVT prophylaxis:  heparin injection 5,000 Units Start: 04/26/20 2200  Code Status: DNR Family Communication: No family at bedside Disposition Plan:  Status is: Inpatient  Remains inpatient appropriate because:IV treatments appropriate due to intensity of illness or inability to take PO and Inpatient level of care appropriate due to severity of illness   Dispo: The patient is from: Home              Anticipated d/c is to: Home              Patient currently is not medically stable to d/c.  Continue IV Lasix.  Cardiology and nephrology following   Difficult to place patient No      Consultants:   Cardiology  Nephrology   Antimicrobials:  Anti-infectives (From admission, onward)   None       Objective: Vitals:   04/28/20 2200 04/28/20 2300 04/29/20 0023 04/29/20 0457  BP: (!) 159/76  (!) 134/51 132/63  Pulse:  61 63 (!) 57  Resp:  17 18 18   Temp:   98.4 F (36.9 C) 98.2 F (36.8 C)  TempSrc:  Oral Oral  SpO2:  98% 97% 98%  Weight:      Height:        Intake/Output Summary (Last 24 hours) at 04/29/2020 0954 Last data filed at 04/29/2020 0829 Gross per 24 hour  Intake 960 ml  Output 3100 ml  Net -2140 ml   Filed Weights   04/26/20 1850 04/27/20 1354 04/28/20 0213  Weight: 113.4 kg 113.8 kg 113.2 kg    Examination: General exam: Appears calm and comfortable  Respiratory system:  Clear to auscultation, slightly diminished bases. Respiratory effort normal. On room air without conversational dyspnea  Cardiovascular system: S1 & S2 heard, RRR. +Bilateral pitting pedal edema. Gastrointestinal system: Abdomen is nondistended, soft and nontender. Normal bowel sounds heard. Central nervous system: Alert and oriented. Non focal exam. Speech clear  Extremities: Symmetric in appearance bilaterally  Skin: No rashes, lesions or ulcers on exposed skin  Psychiatry: Judgement and insight appear stable. Mood & affect appropriate.    Data Reviewed: I have personally reviewed following labs and imaging studies  CBC: Recent Labs  Lab 04/26/20 0950 04/26/20 1724 04/27/20 0456  WBC  --  4.0 4.2  HGB 10.2* 9.5* 9.8*  HCT  --  30.6* 31.5*  MCV  --  86.0 85.4  PLT  --  111* 672*   Basic Metabolic Panel: Recent Labs  Lab 04/26/20 1724 04/27/20 0456 04/28/20 0414 04/29/20 0228  NA 140 139 140 138  K 4.6 4.4 4.0 3.8  CL 107 107 106 105  CO2 21* 22 23 24   GLUCOSE 171* 149* 113* 162*  BUN 131* 129* 125* 121*  CREATININE 4.10* 4.14* 4.22* 4.25*  CALCIUM 9.6 9.7 9.0 9.2   GFR: Estimated Creatinine Clearance: 17 mL/min (A) (by C-G formula based on SCr of 4.25 mg/dL (H)). Liver Function Tests: Recent Labs  Lab 04/26/20 1746  AST 27  ALT 29  ALKPHOS 112  BILITOT 0.5  PROT 6.4*  ALBUMIN 3.5   No results for input(s): LIPASE, AMYLASE in the last 168 hours. No results for input(s): AMMONIA in the last 168 hours. Coagulation Profile: No results for input(s): INR, PROTIME in the last 168 hours. Cardiac Enzymes: No results for input(s): CKTOTAL, CKMB, CKMBINDEX, TROPONINI in the last 168 hours. BNP (last 3 results) Recent Labs    01/25/20 1506  PROBNP 2,507*   HbA1C: Recent Labs    04/26/20 2150  HGBA1C 6.3*   CBG: Recent Labs  Lab 04/28/20 0601 04/28/20 1203 04/28/20 1541 04/28/20 2104 04/29/20 0616  GLUCAP 113* 160* 160* 184* 99   Lipid Profile: No  results for input(s): CHOL, HDL, LDLCALC, TRIG, CHOLHDL, LDLDIRECT in the last 72 hours. Thyroid Function Tests: Recent Labs    04/26/20 1750 04/27/20 0914  TSH 10.113*  --   FREET4  --  1.57*   Anemia Panel: No results for input(s): VITAMINB12, FOLATE, FERRITIN, TIBC, IRON, RETICCTPCT in the last 72 hours. Sepsis Labs: No results for input(s): PROCALCITON, LATICACIDVEN in the last 168 hours.  Recent Results (from the past 240 hour(s))  Resp Panel by RT-PCR (Flu A&B, Covid) Nasopharyngeal Swab     Status: None   Collection Time: 04/26/20  8:42 PM   Specimen: Nasopharyngeal Swab; Nasopharyngeal(NP) swabs in vial transport medium  Result Value Ref Range Status   SARS Coronavirus 2 by RT PCR NEGATIVE NEGATIVE Final    Comment: (NOTE) SARS-CoV-2 target nucleic acids are NOT DETECTED.  The SARS-CoV-2 RNA is generally detectable in upper respiratory specimens during the acute phase of  infection. The lowest concentration of SARS-CoV-2 viral copies this assay can detect is 138 copies/mL. A negative result does not preclude SARS-Cov-2 infection and should not be used as the sole basis for treatment or other patient management decisions. A negative result may occur with  improper specimen collection/handling, submission of specimen other than nasopharyngeal swab, presence of viral mutation(s) within the areas targeted by this assay, and inadequate number of viral copies(<138 copies/mL). A negative result must be combined with clinical observations, patient history, and epidemiological information. The expected result is Negative.  Fact Sheet for Patients:  EntrepreneurPulse.com.au  Fact Sheet for Healthcare Providers:  IncredibleEmployment.be  This test is no t yet approved or cleared by the [place] FDA and  has been authorized for detection and/or diagnosis of SARS-CoV-2 by FDA under an Emergency Use Authorization (EUA). This EUA will remain   in effect (meaning this test can be used) for the duration of the COVID-19 declaration under Section 564(b)(1) of the Act, 21 U.S.C.section 360bbb-3(b)(1), unless the authorization is terminated  or revoked sooner.       Influenza A by PCR NEGATIVE NEGATIVE Final   Influenza B by PCR NEGATIVE NEGATIVE Final    Comment: (NOTE) The Xpert Xpress SARS-CoV-2/FLU/RSV plus assay is intended as an aid in the diagnosis of influenza from Nasopharyngeal swab specimens and should not be used as a sole basis for treatment. Nasal washings and aspirates are unacceptable for Xpert Xpress SARS-CoV-2/FLU/RSV testing.  Fact Sheet for Patients: EntrepreneurPulse.com.au  Fact Sheet for Healthcare Providers: IncredibleEmployment.be  This test is not yet approved or cleared by the [place] FDA and has been authorized for detection and/or diagnosis of SARS-CoV-2 by FDA under an Emergency Use Authorization (EUA). This EUA will remain in effect (meaning this test can be used) for the duration of the COVID-19 declaration under Section 564(b)(1) of the Act, 21 U.S.C. section 360bbb-3(b)(1), unless the authorization is terminated or revoked.  Performed at [facility], [place] [address], [place], [place] [address]       Radiology Studies: ECHOCARDIOGRAM COMPLETE  Result Date: 04/27/2020    ECHOCARDIOGRAM REPORT   Patient Name:   [facility] Date of Exam: 04/27/2020 Medical Rec #:  [id number]         Height:       70.0 in Accession #:    [id number]        Weight:       250.0 lb Date of Birth:  12/08/1950          BSA:          2.295 m Patient Age:    4 years          BP:           150/69 mmHg Patient Gender: F                 HR:           63 bpm. Exam Location:  Inpatient Procedure: 2D Echo, Cardiac Doppler and Color Doppler Indications:     CHF  History:         Patient has prior history of Echocardiogram examinations, most                  recent  02/02/2020. CHF, Signs/Symptoms:Shortness of Breath;                  Risk Factors:Diabetes, Hypertension and Dyslipidemia. CKD.  Sonographer:     Dustin Flock Referring  Phys:  [id number] [place] [facility] Diagnosing Phys: Ena Dawley MD IMPRESSIONS  1. Left ventricular ejection fraction, by estimation, is 65 to 70%. The left ventricle has normal function. The left ventricle has no regional wall motion abnormalities. There is severe concentric left ventricular hypertrophy. Left ventricular diastolic  parameters are consistent with Grade II diastolic dysfunction (pseudonormalization). Elevated left atrial pressure.  2. Right ventricular systolic function is normal. The right ventricular size is normal. There is mildly elevated pulmonary artery systolic pressure. The estimated right ventricular systolic pressure is 82.4 mmHg.  3. Left atrial size was moderately dilated.  4. Right atrial size was mildly dilated.  5. A small pericardial effusion is present. The pericardial effusion is circumferential. There is no evidence of cardiac tamponade.  6. The mitral valve is normal in structure. Mild mitral valve regurgitation. Mild mitral stenosis. Severe mitral annular calcification.  7. The aortic valve is normal in structure. There is moderate calcification of the aortic valve. There is moderate thickening of the aortic valve. Aortic valve regurgitation is not visualized. Mild to moderate aortic valve sclerosis/calcification is present, without any evidence of aortic stenosis.  8. The inferior vena cava is dilated in size with <50% respiratory variability, suggesting right atrial pressure of 15 mmHg. FINDINGS  Left Ventricle: Left ventricular ejection fraction, by estimation, is 65 to 70%. The left ventricle has normal function. The left ventricle has no regional wall motion abnormalities. The left ventricular internal cavity size was normal in size. There is  severe concentric left ventricular hypertrophy. Left ventricular  diastolic parameters are consistent with Grade II diastolic dysfunction (pseudonormalization). Elevated left atrial pressure. Right Ventricle: The right ventricular size is normal. No increase in right ventricular wall thickness. Right ventricular systolic function is normal. There is mildly elevated pulmonary artery systolic pressure. The tricuspid regurgitant velocity is 2.57  m/s, and with an assumed right atrial pressure of 15 mmHg, the estimated right ventricular systolic pressure is 23.5 mmHg. Left Atrium: Left atrial size was moderately dilated. Right Atrium: Right atrial size was mildly dilated. Pericardium: A small pericardial effusion is present. The pericardial effusion is circumferential. There is no evidence of cardiac tamponade. Mitral Valve: The mitral valve is normal in structure. There is mild thickening of the mitral valve leaflet(s). There is mild calcification of the mitral valve leaflet(s). Severe mitral annular calcification. Mild mitral valve regurgitation. Mild mitral valve stenosis. MV peak gradient, 12.7 mmHg. The mean mitral valve gradient is 6.0 mmHg with average heart rate of 57 bpm. Tricuspid Valve: The tricuspid valve is normal in structure. Tricuspid valve regurgitation is mild . No evidence of tricuspid stenosis. Aortic Valve: The aortic valve is normal in structure. There is moderate calcification of the aortic valve. There is moderate thickening of the aortic valve. Aortic valve regurgitation is not visualized. Mild to moderate aortic valve sclerosis/calcification is present, without any evidence of aortic stenosis. Aortic valve mean gradient measures 13.0 mmHg. Aortic valve peak gradient measures 21.7 mmHg. Aortic valve area, by VTI measures 2.62 cm. Pulmonic Valve: The pulmonic valve was normal in structure. Pulmonic valve regurgitation is mild. No evidence of pulmonic stenosis. Aorta: The aortic root is normal in size and structure. Venous: The inferior vena cava is dilated in  size with less than 50% respiratory variability, suggesting right atrial pressure of 15 mmHg. IAS/Shunts: No atrial level shunt detected by color flow Doppler.  LEFT VENTRICLE PLAX 2D LVIDd:         4.50 cm  Diastology LVIDs:         2.60 cm      LV e' medial:    6.31 cm/s LV PW:         1.80 cm      LV E/e' medial:  27.9 LV IVS:        1.90 cm      LV e' lateral:   6.20 cm/s LVOT diam:     2.20 cm      LV E/e' lateral: 28.4 LV SV:         147 LV SV Index:   64 LVOT Area:     3.80 cm  LV Volumes (MOD) LV vol d, MOD A4C: 113.0 ml LV vol s, MOD A4C: 37.7 ml LV SV MOD A4C:     113.0 ml RIGHT VENTRICLE RV Basal diam:  4.20 cm RV S prime:     17.30 cm/s TAPSE (M-mode): 3.0 cm LEFT ATRIUM              Index       RIGHT ATRIUM           Index LA diam:        4.80 cm  2.09 cm/m  RA Area:     26.90 cm LA Vol (A2C):   118.0 ml 51.42 ml/m RA Volume:   85.60 ml  37.30 ml/m LA Vol (A4C):   102.0 ml 44.45 ml/m LA Biplane Vol: 114.0 ml 49.67 ml/m  AORTIC VALVE AV Area (Vmax):    2.84 cm AV Area (Vmean):   2.74 cm AV Area (VTI):     2.62 cm AV Vmax:           233.00 cm/s AV Vmean:          168.000 cm/s AV VTI:            0.562 m AV Peak Grad:      21.7 mmHg AV Mean Grad:      13.0 mmHg LVOT Vmax:         174.00 cm/s LVOT Vmean:        121.000 cm/s LVOT VTI:          0.388 m LVOT/AV VTI ratio: 0.69  AORTA Ao Root diam: 3.30 cm MITRAL VALVE                TRICUSPID VALVE MV Area (PHT): 1.70 cm     TR Peak grad:   26.4 mmHg MV Area VTI:   2.03 cm     TR Vmax:        257.00 cm/s MV Peak grad:  12.7 mmHg MV Mean grad:  6.0 mmHg     SHUNTS MV Vmax:       1.78 m/s     Systemic VTI:  0.39 m MV Vmean:      109.5 cm/s   Systemic Diam: 2.20 cm MV Decel Time: 447 msec MV E velocity: 176.00 cm/s MV A velocity: 166.00 cm/s MV E/A ratio:  1.06 Ena Dawley MD Electronically signed by Ena Dawley MD Signature Date/Time: 04/27/2020/12:37:22 PM    Final (Updated)       Scheduled Meds: . allopurinol  100 mg Oral BID  .  amLODipine  10 mg Oral BID  . calcitRIOL  0.5 mcg Oral Daily  . carvedilol  6.25 mg Oral BID WC  . cloNIDine  0.3 mg Transdermal Q Fri  . furosemide  120 mg Intravenous BID  . heparin  5,000 Units Subcutaneous  Q8H  . hydrALAZINE  50 mg Oral TID  . insulin aspart  0-15 Units Subcutaneous TID WC  . insulin aspart  0-5 Units Subcutaneous QHS  . insulin glargine  10 Units Subcutaneous QHS  . isosorbide mononitrate  30 mg Oral Daily  . levothyroxine  200 mcg Oral Q0600  . omega-3 acid ethyl esters  1 g Oral Daily  . rosuvastatin  10 mg Oral QPM   Continuous Infusions:   LOS: 3 days      Time spent: 25 minutes   Dessa Phi, DO [facility] 04/29/2020, 9:54 AM   Available via Epic secure chat 7am-7pm After these hours, please refer to coverage provider listed on [email or web address]

## 2020-04-29 NOTE — Progress Notes (Signed)
Progress Note  Patient Name: Tiffany [name] Date of Encounter: 04/29/2020  Primary Cardiologist: Dr. Daneen Schick, MD  Subjective   No acute events overnight. Having frequent nonproductive cough this AM. Feels like she is improving slowly.  Inpatient Medications    Scheduled Meds: . allopurinol  100 mg Oral BID  . amLODipine  10 mg Oral BID  . calcitRIOL  0.5 mcg Oral Daily  . carvedilol  6.25 mg Oral BID WC  . cloNIDine  0.3 mg Transdermal Q Fri  . furosemide  120 mg Intravenous BID  . heparin  5,000 Units Subcutaneous Q8H  . hydrALAZINE  50 mg Oral TID  . insulin aspart  0-15 Units Subcutaneous TID WC  . insulin aspart  0-5 Units Subcutaneous QHS  . insulin glargine  10 Units Subcutaneous QHS  . isosorbide mononitrate  30 mg Oral Daily  . levothyroxine  200 mcg Oral Q0600  . omega-3 acid ethyl esters  1 g Oral Daily  . rosuvastatin  10 mg Oral QPM   Continuous Infusions:  PRN Meds: acetaminophen, polyethylene glycol, polyvinyl alcohol, zolpidem   Vital Signs    Vitals:   04/29/20 0023 04/29/20 0457 04/29/20 1045 04/29/20 1048  BP: (!) 134/51 132/63 (!) 158/75 (!) 158/75  Pulse: 63 (!) 57  (!) 58  Resp: 18 18  19   Temp: 98.4 F (36.9 C) 98.2 F (36.8 C)  98.1 F (36.7 C)  TempSrc: Oral Oral  Oral  SpO2: 97% 98%  94%  Weight:      Height:        Intake/Output Summary (Last 24 hours) at 04/29/2020 1201 Last data filed at 04/29/2020 0829 Gross per 24 hour  Intake 960 ml  Output 2200 ml  Net -1240 ml   Filed Weights   04/26/20 1850 04/27/20 1354 04/28/20 0213  Weight: 113.4 kg 113.8 kg 113.2 kg    Physical Exam   GEN: Well nourished, well developed in no acute distress NECK: No JVD CARDIAC: regular rhythm, normal S1 and S2, no rubs or gallops. No murmur. VASCULAR: Radial pulses 2+ bilaterally.  RESPIRATORY:  Clear to auscultation without rales, wheezing or rhonchi  ABDOMEN: Soft, non-tender, non-distended MUSCULOSKELETAL:  Moves all 4 limbs  independently SKIN: Warm and dry, bilateral LE edema NEUROLOGIC:  No focal neuro deficits noted. PSYCHIATRIC:  Normal affect   Labs    Chemistry Recent Labs  Lab 04/26/20 1746 04/27/20 0456 04/28/20 0414 04/29/20 0228  NA  --  139 140 138  K  --  4.4 4.0 3.8  CL  --  107 106 105  CO2  --  22 23 24   GLUCOSE  --  149* 113* 162*  BUN  --  129* 125* 121*  CREATININE  --  4.14* 4.22* 4.25*  CALCIUM  --  9.7 9.0 9.2  PROT 6.4*  --   --   --   ALBUMIN 3.5  --   --   --   AST 27  --   --   --   ALT 29  --   --   --   ALKPHOS 112  --   --   --   BILITOT 0.5  --   --   --   GFRNONAA  --  11* 11* 11*  ANIONGAP  --  10 11 9      Hematology Recent Labs  Lab 04/26/20 0950 04/26/20 1724 04/27/20 0456  WBC  --  4.0 4.2  RBC  --  3.56* 3.69*  HGB 10.2* 9.5* 9.8*  HCT  --  30.6* 31.5*  MCV  --  86.0 85.4  MCH  --  26.7 26.6  MCHC  --  31.0 31.1  RDW  --  19.9* 19.9*  PLT  --  111* 111*    Cardiac EnzymesNo results for input(s): TROPONINI in the last 168 hours. No results for input(s): TROPIPOC in the last 168 hours.   BNP Recent Labs  Lab 04/26/20 1746  BNP 322.2*     DDimer No results for input(s): DDIMER in the last 168 hours.   Radiology    No results found. Telemetry   SR/SB - Personally Reviewed  ECG    No new tracing since 04/26/20 Personally Reviewed  Cardiac Studies   Echocardiogram 04/27/20:  1. Left ventricular ejection fraction, by estimation, is 65 to 70%. The  left ventricle has normal function. The left ventricle has no regional  wall motion abnormalities. There is severe concentric left ventricular  hypertrophy. Left ventricular diastolic  parameters are consistent with Grade II diastolic dysfunction  (pseudonormalization). Elevated left atrial pressure.  2. Right ventricular systolic function is normal. The right ventricular  size is normal. There is mildly elevated pulmonary artery systolic  pressure. The estimated right ventricular  systolic pressure is 62.9 mmHg.  3. Left atrial size was moderately dilated.  4. Right atrial size was mildly dilated.  5. A small pericardial effusion is present. The pericardial effusion is  circumferential. There is no evidence of cardiac tamponade.  6. The mitral valve is normal in structure. Mild mitral valve  regurgitation. Mild mitral stenosis. Severe mitral annular calcification.  7. The aortic valve is normal in structure. There is moderate  calcification of the aortic valve. There is moderate thickening of the  aortic valve. Aortic valve regurgitation is not visualized. Mild to  moderate aortic valve sclerosis/calcification is  present, without any evidence of aortic stenosis.  8. The inferior vena cava is dilated in size with <50% respiratory  variability, suggesting right atrial pressure of 15 mmHg.   Echocardiogram 02/02/2020:  1. Left ventricular ejection fraction, by estimation, is 65 to 70%. The  left ventricle has normal function. The left ventricle has no regional  wall motion abnormalities. There is severe left ventricular hypertrophy.  Left ventricular diastolic parameters  are consistent with Grade II diastolic dysfunction (pseudonormalization).  Elevated left atrial pressure.  2. Right ventricular systolic function is normal. The right ventricular  size is normal. There is mildly elevated pulmonary artery systolic  pressure. The estimated right ventricular systolic pressure is 47.6 mmHg.  3. A small pericardial effusion is present.  4. Left atrial size was severely dilated.  5. The mitral valve is abnormal. Severe mitral annular calcification.  Trivial mitral valve regurgitation. Moderate mitral stenosis. MG 8 mmHg at  HR 65 bpm, MVA 1.5 cm^2 by continuity equation  6. The aortic valve is tricuspid. There is moderate thickening of the  aortic valve. Aortic valve regurgitation is not visualized. Mild aortic  valve stenosis. Vmax 2.9 m/s, MG 32mmHg,  AVA 1.9 cm^2, DI 0.6  7. The inferior vena cava is dilated in size with <50% respiratory  variability, suggesting right atrial pressure of 15 mmHg.   Gated nuclear stress test 01/26/2020:   The left ventricular ejection fraction is hyperdynamic (>65%).  Nuclear stress EF: 67%.  There was no ST segment deviation noted during stress.  No T wave inversion was noted during stress.  The study is normal.  This is a low risk study.   Patient Profile     70 y.o. female with a hx of a history of chronic diastolic CHF, hypertension, OSA on CPAP, DM2, CKD stage V who has refused dialysis in the past, anemia of chronic disease, HLD and hypothyroidism who is being seen today for the evaluation of CHF at the request of Dr. Maylene Roes.  Assessment & Plan    1.  Acute on chronic diastolic CHF: -Patient followed by Dr. Tamala Julian for her cardiology care.  She has a history of frequent ED admissions for CHF exacerbation.  She was most recently seen in the ED for similar symptoms 04/17/2020 treated with alternating torsemide dosing between 100/150 mg daily. She presented with worsening shortness of breath found to have a BNP with CXR consistent with CHF exacerbation. -net negative 5 L this admission, weight pending today -Repeat echocardiogram 04/27/20 with EF normal, severe LVH, grade 2 diastolic dysfunction, RVSP 41 mmHg, small pericardial effusion, aortic sclerosis  -Continue with IV Lasix 120mg  BID -will given robitussin for cough, lungs are clear  2.  CKD stage V: -Creatinine, 4.25 -has declined dialysis, appreciate nephrology input.  3.  HTN:  -range 132/63-164/82  -Continue current regimen with amlodipine 10, carvedilol 6.25, clonidine patch 0.3 mg, Imdur 30 -No room to increase carvedilol due to bradycardia  -Increased hydralazine to 50mg  TID this admission  4.  HLD: -Treated with Lovaza>>continue   5.  DM2: -HbA1c, 6.2 -SSI for glucose control while inpatient status -Continue  Lantus -Management per IM  Other hospital problems include: -OSA -Hypothyroidism  Signed, Buford Dresser, MD, PhD, [facility]  [address], [place], [place] [address] [phone] 04/29/2020, 12:01 PM     For questions or updates, please contact   Please consult [email or web address] for contact info under Cardiology/STEMI.

## 2020-04-29 NOTE — Progress Notes (Signed)
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 Rise in Cr likely related to a cardiorenal picture. I am suspecting that there may actually just be a component of just progression of disease. Would accept a degree of Cr rise in the context of aggressive diuresis. Fortunately her kidney function is stable today -follows with Dr. Johnney Ou ([facility]) -had declined HD in the past and we had discussed this again on 3/25. She still declines any form of renal replacement therapy. Fortunately, she continues to not exhibiting any uremic symptoms which Ms. Cifelli and I re-reviewed -If kidney function worsens and/or becomes refractory to diuretics, I would advocate for a palliative care consultation. I discussed this on 3/25 with the patient as a possibility and she is in full agreement of discussing this with palliative care and transition to comfort care if/when the time comes -Continue to monitor daily Cr, Dose meds for GFR<15 -Monitor Daily I/Os, Daily weight  -Maintain MAP>65 for optimal renal perfusion.  -Avoid further nephrotoxins including NSAIDS, Morphine.  Unless absolutely necessary, avoid CT with contrast and/or MRI with gadolinium.     Acute on chronic diastolic heart failure -continue with IV lasix at 120mg  BID (on torsemide 100mg  daily at home, 150mg  for weight gain), her volume status looks okay for now and is slowly improving. If needed can trial one dose of metolazone +/- increasing her lasix frequency to TID but I do not see a need to do that as this time. If she continues to improve, may just need to get her back on torsemide but at a higher dose given her degree of renal dysfunction -consider checking another CXR to see how her vascular congestion is doing if her respiratory status is a concern but not concerning at the moment -cardiology on board  Demand Ischemia -troponins peaked, cardio on  board  Hypertension: BP acceptable, resume home anti-HTNs  Secondary hyperparathyroidism, CKD-MBD -check PTH, monitor phos. 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 Of note, she is chronically anemic so her hba1c may just falsely low  Subjective:   No acute events, she reports that she is improving slowly.  She feels as if her swelling is better but not back down to her baseline.  She has been working with PT but does get dyspnea on exertion.   Objective:   BP (!) 158/75   Pulse (!) 58   Temp 98.1 F (36.7 C) (Oral)   Resp 19   Ht 5\' 10"  (1.778 m)   Wt 113.2 kg   SpO2 94%   BMI 35.81 kg/m   Intake/Output Summary (Last 24 hours) at 04/29/2020 1312 Last data filed at 04/29/2020 1246 Gross per 24 hour  Intake 1077 ml  Output 2200 ml  Net -1123 ml   Weight change:   Physical Exam: Gen: NAD, sitting up in bed CVS: S1-S2, RRR, no M/R/G Resp: CTA bilaterally, unlabored/normal work of breathing, speaking in full sentences Abd: Soft, nontender, nondistended Ext: 1-2+ pitting edema bilateral lower extremities Neuro: Speech clear and coherent, moves all extremity spontaneously, no asterixis  Imaging: No results found.  Labs: BMET Recent Labs  Lab 04/26/20 1724 04/27/20 0456 04/28/20 0414 04/29/20 0228  NA 140 139 140 138  K 4.6 4.4 4.0 3.8  CL 107  107 106 105  CO2 21* 22 23 24   GLUCOSE 171* 149* 113* 162*  BUN 131* 129* 125* 121*  CREATININE 4.10* 4.14* 4.22* 4.25*  CALCIUM 9.6 9.7 9.0 9.2   CBC Recent Labs  Lab 04/26/20 0950 04/26/20 1724 04/27/20 0456  WBC  --  4.0 4.2  HGB 10.2* 9.5* 9.8*  HCT  --  30.6* 31.5*  MCV  --  86.0 85.4  PLT  --  111* 111*    Medications:    . allopurinol  100 mg Oral BID  . amLODipine  10 mg Oral BID  . calcitRIOL  0.5 mcg Oral Daily   . carvedilol  6.25 mg Oral BID WC  . cloNIDine  0.3 mg Transdermal Q Fri  . furosemide  120 mg Intravenous BID  . heparin  5,000 Units Subcutaneous Q8H  . hydrALAZINE  50 mg Oral TID  . insulin aspart  0-15 Units Subcutaneous TID WC  . insulin aspart  0-5 Units Subcutaneous QHS  . insulin glargine  10 Units Subcutaneous QHS  . isosorbide mononitrate  30 mg Oral Daily  . levothyroxine  200 mcg Oral Q0600  . omega-3 acid ethyl esters  1 g Oral Daily  . rosuvastatin  10 mg Oral QPM      Gean Quint, MD [facility] 04/29/2020, 1:12 PM

## 2020-04-30 LAB — GLUCOSE, CAPILLARY
Glucose-Capillary: 124 mg/dL — ABNORMAL HIGH (ref 70–99)
Glucose-Capillary: 128 mg/dL — ABNORMAL HIGH (ref 70–99)
Glucose-Capillary: 131 mg/dL — ABNORMAL HIGH (ref 70–99)
Glucose-Capillary: 219 mg/dL — ABNORMAL HIGH (ref 70–99)

## 2020-04-30 LAB — BASIC METABOLIC PANEL
Anion gap: 11 (ref 5–15)
BUN: 120 mg/dL — ABNORMAL HIGH (ref 8–23)
CO2: 23 mmol/L (ref 22–32)
Calcium: 9.4 mg/dL (ref 8.9–10.3)
Chloride: 105 mmol/L (ref 98–111)
Creatinine, Ser: 4.21 mg/dL — ABNORMAL HIGH (ref 0.44–1.00)
GFR, Estimated: 11 mL/min — ABNORMAL LOW (ref >60)
Glucose, Bld: 125 mg/dL — ABNORMAL HIGH (ref 70–99)
Potassium: 3.7 mmol/L (ref 3.5–5.1)
Sodium: 139 mmol/L (ref 135–145)

## 2020-04-30 MED ORDER — BOOST / RESOURCE BREEZE PO LIQD CUSTOM
1.0000 | Freq: Three times a day (TID) | ORAL | Status: DC
  Administered 2020-04-30 – 2020-05-02 (×6): 1 via ORAL

## 2020-04-30 MED ORDER — ACETAMINOPHEN 325 MG PO TABS
650.0000 mg | ORAL_TABLET | Freq: Four times a day (QID) | ORAL | Status: DC | PRN
  Administered 2020-04-30 – 2020-05-01 (×2): 650 mg via ORAL
  Filled 2020-04-30 (×2): qty 2

## 2020-04-30 MED ORDER — ONDANSETRON HCL 4 MG/2ML IJ SOLN
4.0000 mg | Freq: Four times a day (QID) | INTRAMUSCULAR | Status: DC | PRN
  Administered 2020-04-30: 4 mg via INTRAVENOUS
  Filled 2020-04-30: qty 2

## 2020-04-30 NOTE — Plan of Care (Signed)
?  Problem: Clinical Measurements: ?Goal: Will remain free from infection ?Outcome: Progressing ?  ?Problem: Clinical Measurements: ?Goal: Diagnostic test results will improve ?Outcome: Progressing ?  ?

## 2020-04-30 NOTE — Progress Notes (Signed)
PROGRESS NOTE    Tiffany [name]  RDE:[id number] DOB: 04-17-1950 DOA: 04/26/2020 PCP: Nolene Ebbs, MD     Brief Narrative:  Tiffany [name] is a 70 year old female with past medical history significant for chronic diastolic heart failure, hypertension, OSA on CPAP, insulin-dependent type 2 diabetes, hypothyroidism, CKD stage V and has refused dialysis in the past, anemia of chronic disease, hyperlipidemia who presents to the hospital with worsening shortness of breath.  She states that for the past several weeks, she has been having worsening shortness of breath with exertion, chest tightness, has been sleeping with 4 pillows, with peripheral edema.  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  She was evaluated in the emergency department on 3/14, received one-time dose of IV Lasix, increased home torsemide dose and discharged home.  She was also hospitalized in December for heart failure exacerbation. Cardiology and Nephrology consulted to assist with diuresis.   New events last 24 hours / Subjective: Continues to diurese well, 3.3 L in the last 24 hours.  No new physical complaints this morning  Assessment & Plan:   Principal Problem:   Acute on chronic diastolic (congestive) heart failure (HCC) Active Problems:   Hypothyroidism   Obstructive sleep apnea   Essential hypertension   Chronic kidney disease, stage V (HCC)   Hyperlipidemia   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)   Acute on chronic diastolic heart failure -BNP 322.2 -Hold home torsemide -Repeat echocardiogram with EF 65 to 63%, grade 2 diastolic dysfunction -Continue IV Lasix -Strict I's and O's, daily weight -Cardiology following  AKI on CKD stage V -Baseline creatinine around 3.5-4 -Patient was evaluated by nephrology in the past, patient has refused dialysis.  No urgent dialysis needed at this time -Nephrology following,  creatinine remains stable for now at 4.2  Demand ischemia -Troponin 99 --> 80 -Secondary to above  Hypothyroidism -TSH elevated.  Free T4 elevated -Continue Synthroid, decreased Synthroid dose  -Follows with endocrinology as an outpatient  Type 2 diabetes, insulin-dependent, well controlled -Hemoglobin A1c 6.3 -Continue Lantus, sliding scale insulin  Hypertension -Continue amlodipine, Coreg, clonidine, hydralazine, imdur, Lasix  Hyperlipidemia -Continue crestor, lovaza   OSA -CPAP nightly   DVT prophylaxis:  heparin injection 5,000 Units Start: 04/26/20 2200  Code Status: DNR Family Communication: No family at bedside Disposition Plan:  Status is: Inpatient  Remains inpatient appropriate because:IV treatments appropriate due to intensity of illness or inability to take PO and Inpatient level of care appropriate due to severity of illness   Dispo: The patient is from: Home              Anticipated d/c is to: Home              Patient currently is not medically stable to d/c.  Continue IV Lasix.  Cardiology and nephrology following   Difficult to place patient No      Consultants:   Cardiology  Nephrology   Antimicrobials:  Anti-infectives (From admission, onward)   None       Objective: Vitals:   04/30/20 0044 04/30/20 0049 04/30/20 0339 04/30/20 0731  BP:   (!) 152/62 (!) 145/70  Pulse:  70 66 60  Resp:  19 18 19   Temp:   98.4 F (36.9 C) 98.5 F (36.9 C)  TempSrc:   Oral Oral  SpO2:   100% 99%  Weight: 112.1 kg  Height:        Intake/Output Summary (Last 24 hours) at 04/30/2020 1034 Last data filed at 04/30/2020 0849 Gross per 24 hour  Intake 957 ml  Output 3350 ml  Net -2393 ml   Filed Weights   04/27/20 1354 04/28/20 0213 04/30/20 0044  Weight: 113.8 kg 113.2 kg 112.1 kg    Examination: General exam: Appears calm and comfortable  Respiratory system: Clear to auscultation. Respiratory effort normal.  On room  air Cardiovascular system: S1 & S2 heard, RRR.  Bilateral pitting pretibial edema, improved since admission. Gastrointestinal system: Abdomen is nondistended, soft and nontender. Normal bowel sounds heard. Central nervous system: Alert and oriented. Non focal exam. Speech clear  Extremities: Symmetric in appearance bilaterally  Skin: No rashes, lesions or ulcers on exposed skin  Psychiatry: Judgement and insight appear stable. Mood & affect appropriate.    Data Reviewed: I have personally reviewed following labs and imaging studies  CBC: Recent Labs  Lab 04/26/20 0950 04/26/20 1724 04/27/20 0456  WBC  --  4.0 4.2  HGB 10.2* 9.5* 9.8*  HCT  --  30.6* 31.5*  MCV  --  86.0 85.4  PLT  --  111* 725*   Basic Metabolic Panel: Recent Labs  Lab 04/26/20 1724 04/27/20 0456 04/28/20 0414 04/29/20 0228 04/30/20 0416  NA 140 139 140 138 139  K 4.6 4.4 4.0 3.8 3.7  CL 107 107 106 105 105  CO2 21* 22 23 24 23   GLUCOSE 171* 149* 113* 162* 125*  BUN 131* 129* 125* 121* 120*  CREATININE 4.10* 4.14* 4.22* 4.25* 4.21*  CALCIUM 9.6 9.7 9.0 9.2 9.4   GFR: Estimated Creatinine Clearance: 17.1 mL/min (A) (by C-G formula based on SCr of 4.21 mg/dL (H)). Liver Function Tests: Recent Labs  Lab 04/26/20 1746  AST 27  ALT 29  ALKPHOS 112  BILITOT 0.5  PROT 6.4*  ALBUMIN 3.5   No results for input(s): LIPASE, AMYLASE in the last 168 hours. No results for input(s): AMMONIA in the last 168 hours. Coagulation Profile: No results for input(s): INR, PROTIME in the last 168 hours. Cardiac Enzymes: No results for input(s): CKTOTAL, CKMB, CKMBINDEX, TROPONINI in the last 168 hours. BNP (last 3 results) Recent Labs    01/25/20 1506  PROBNP 2,507*   HbA1C: No results for input(s): HGBA1C in the last 72 hours. CBG: Recent Labs  Lab 04/29/20 0616 04/29/20 1046 04/29/20 1532 04/29/20 2114 04/30/20 0621  GLUCAP 99 172* 207* 151* 124*   Lipid Profile: No results for input(s): CHOL,  HDL, LDLCALC, TRIG, CHOLHDL, LDLDIRECT in the last 72 hours. Thyroid Function Tests: No results for input(s): TSH, T4TOTAL, FREET4, T3FREE, THYROIDAB in the last 72 hours. Anemia Panel: No results for input(s): VITAMINB12, FOLATE, FERRITIN, TIBC, IRON, RETICCTPCT in the last 72 hours. Sepsis Labs: No results for input(s): PROCALCITON, LATICACIDVEN in the last 168 hours.  Recent Results (from the past 240 hour(s))  Resp Panel by RT-PCR (Flu A&B, Covid) Nasopharyngeal Swab     Status: None   Collection Time: 04/26/20  8:42 PM   Specimen: Nasopharyngeal Swab; Nasopharyngeal(NP) swabs in vial transport medium  Result Value Ref Range Status   SARS Coronavirus 2 by RT PCR NEGATIVE NEGATIVE Final    Comment: (NOTE) SARS-CoV-2 target nucleic acids are NOT DETECTED.  The SARS-CoV-2 RNA is generally detectable in upper respiratory specimens during the acute phase of infection. The lowest concentration of SARS-CoV-2 viral copies this assay can detect is 138 copies/mL. A negative result  does not preclude SARS-Cov-2 infection and should not be used as the sole basis for treatment or other patient management decisions. A negative result may occur with  improper specimen collection/handling, submission of specimen other than nasopharyngeal swab, presence of viral mutation(s) within the areas targeted by this assay, and inadequate number of viral copies(<138 copies/mL). A negative result must be combined with clinical observations, patient history, and epidemiological information. The expected result is Negative.  Fact Sheet for Patients:  EntrepreneurPulse.com.au  Fact Sheet for Healthcare Providers:  IncredibleEmployment.be  This test is no t yet approved or cleared by the [place] FDA and  has been authorized for detection and/or diagnosis of SARS-CoV-2 by FDA under an Emergency Use Authorization (EUA). This EUA will remain  in effect (meaning this  test can be used) for the duration of the COVID-19 declaration under Section 564(b)(1) of the Act, 21 U.S.C.section 360bbb-3(b)(1), unless the authorization is terminated  or revoked sooner.       Influenza A by PCR NEGATIVE NEGATIVE Final   Influenza B by PCR NEGATIVE NEGATIVE Final    Comment: (NOTE) The Xpert Xpress SARS-CoV-2/FLU/RSV plus assay is intended as an aid in the diagnosis of influenza from Nasopharyngeal swab specimens and should not be used as a sole basis for treatment. Nasal washings and aspirates are unacceptable for Xpert Xpress SARS-CoV-2/FLU/RSV testing.  Fact Sheet for Patients: EntrepreneurPulse.com.au  Fact Sheet for Healthcare Providers: IncredibleEmployment.be  This test is not yet approved or cleared by the [place] FDA and has been authorized for detection and/or diagnosis of SARS-CoV-2 by FDA under an Emergency Use Authorization (EUA). This EUA will remain in effect (meaning this test can be used) for the duration of the COVID-19 declaration under Section 564(b)(1) of the Act, 21 U.S.C. section 360bbb-3(b)(1), unless the authorization is terminated or revoked.  Performed at [facility], [place] [address], [place], [place] [address]       Radiology Studies: No results found.    Scheduled Meds: . allopurinol  100 mg Oral BID  . amLODipine  10 mg Oral BID  . calcitRIOL  0.5 mcg Oral Daily  . carvedilol  6.25 mg Oral BID WC  . cloNIDine  0.3 mg Transdermal Q Fri  . furosemide  120 mg Intravenous BID  . heparin  5,000 Units Subcutaneous Q8H  . hydrALAZINE  50 mg Oral TID  . insulin aspart  0-15 Units Subcutaneous TID WC  . insulin aspart  0-5 Units Subcutaneous QHS  . insulin glargine  10 Units Subcutaneous QHS  . isosorbide mononitrate  30 mg Oral Daily  . levothyroxine  200 mcg Oral Q0600  . omega-3 acid ethyl esters  1 g Oral Daily  . rosuvastatin  10 mg Oral QPM   Continuous  Infusions:   LOS: 4 days      Time spent: 25 minutes   Dessa Phi, DO [facility] 04/30/2020, 10:34 AM   Available via Epic secure chat 7am-7pm After these hours, please refer to coverage provider listed on [email or web address]

## 2020-04-30 NOTE — Plan of Care (Signed)

## 2020-04-30 NOTE — Progress Notes (Signed)
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 Rise in Cr likely related to a cardiorenal picture. I am suspecting that there may actually just be a component of just progression of disease. Would accept a degree of Cr rise in the context of aggressive diuresis. Fortunately her kidney function is stable today -follows with Dr. Johnney Ou ([facility]) -had declined HD in the past and we had discussed this again on 3/25 (has had in-depth discussions about with Dr. Johnney Ou as an outpatient). She still declines any form of renal replacement therapy. Fortunately, she continues to not exhibiting any uremic symptoms which Ms. Waldman and I have reviewed together -If kidney function worsens and/or becomes refractory to diuretics, I would advocate for a palliative care consultation. I discussed this on 3/25 with the patient as a possibility and she is in full agreement of discussing this with palliative care and transition to comfort care if/when the time comes -Continue to monitor daily Cr, Dose meds for GFR<15 -Monitor Daily I/Os, Daily weight  -Maintain MAP>65 for optimal renal perfusion.  -Avoid further nephrotoxins including NSAIDS, Morphine.  Unless absolutely necessary, avoid CT with contrast and/or MRI with gadolinium.     Acute on chronic diastolic heart failure -continue with IV lasix at 120mg  BID for today (on torsemide 100mg  daily at home, 150mg  for weight gain), her volume status looks like it is slowly improving. If needed can trial one dose of metolazone +/- increasing her lasix frequency to TID but I do not see a need to do that as this time. If she continues to improve, may just need to get her back on torsemide but at a higher dose given her degree of renal dysfunction (may need to alternate with 100mg  daily and 150mg  daily of torsemide) -consider checking another CXR to see how her vascular congestion is doing if her  respiratory status is a concern but not concerning at the moment -cardiology on board  Demand Ischemia -troponins peaked, cardio on board  Hypertension: BP acceptable, resume home anti-HTNs  Secondary hyperparathyroidism, CKD-MBD -check PTH, monitor phos. 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 Of note, she is chronically anemic so her hba1c may just falsely low  Subjective:   No acute events, she reports that she is improving slowly.  Breathing has improved. Weight is down from 113.2 (3/25) > 112.1kg (3/27). She does report that she didn't have an appetite for breakfast this AM. Denies n/v, dysgeusia, tremors, orthopnea, brain fog, hiccups.   Objective:   BP (!) 145/70 (BP Location: Right Arm)   Pulse 60   Temp 98.5 F (36.9 C) (Oral)   Resp 19   Ht 5\' 10"  (1.778 m)   Wt 112.1 kg   SpO2 99%   BMI 35.47 kg/m   Intake/Output Summary (Last 24 hours) at 04/30/2020 1055 Last data filed at 04/30/2020 7425 Gross per 24 hour  Intake 957 ml  Output 3350 ml  Net -2393 ml   Weight change:   Physical Exam: Gen: NAD, laying flat in bed CVS: S1-S2, RRR, no M/R/G Resp: CTA bilaterally, unlabored/normal work of breathing, speaking in full sentences Abd: Soft, nontender, nondistended Ext: 1+ pitting edema bilateral lower extremities Neuro: Speech clear and coherent, moves all extremity spontaneously, no asterixis  Imaging:  No results found.  Labs: BMET Recent Labs  Lab 04/26/20 1724 04/27/20 0456 04/28/20 0414 04/29/20 0228 04/30/20 0416  NA 140 139 140 138 139  K 4.6 4.4 4.0 3.8 3.7  CL 107 107 106 105 105  CO2 21* 22 23 24 23   GLUCOSE 171* 149* 113* 162* 125*  BUN 131* 129* 125* 121* 120*  CREATININE 4.10* 4.14* 4.22* 4.25* 4.21*  CALCIUM 9.6 9.7 9.0 9.2 9.4   CBC Recent Labs   Lab 04/26/20 0950 04/26/20 1724 04/27/20 0456  WBC  --  4.0 4.2  HGB 10.2* 9.5* 9.8*  HCT  --  30.6* 31.5*  MCV  --  86.0 85.4  PLT  --  111* 111*    Medications:    . allopurinol  100 mg Oral BID  . amLODipine  10 mg Oral BID  . calcitRIOL  0.5 mcg Oral Daily  . carvedilol  6.25 mg Oral BID WC  . cloNIDine  0.3 mg Transdermal Q Fri  . furosemide  120 mg Intravenous BID  . heparin  5,000 Units Subcutaneous Q8H  . hydrALAZINE  50 mg Oral TID  . insulin aspart  0-15 Units Subcutaneous TID WC  . insulin aspart  0-5 Units Subcutaneous QHS  . insulin glargine  10 Units Subcutaneous QHS  . isosorbide mononitrate  30 mg Oral Daily  . levothyroxine  200 mcg Oral Q0600  . omega-3 acid ethyl esters  1 g Oral Daily  . rosuvastatin  10 mg Oral QPM      Gean Quint, MD [facility] 04/30/2020, 10:55 AM

## 2020-04-30 NOTE — Progress Notes (Signed)
Progress Note  Patient Name: Tiffany [name] Date of Encounter: 04/30/2020  Primary Cardiologist: Dr. Daneen Schick, MD  Subjective   Doesn't feel like herself today, but no specific complaints.  Inpatient Medications    Scheduled Meds: . allopurinol  100 mg Oral BID  . amLODipine  10 mg Oral BID  . calcitRIOL  0.5 mcg Oral Daily  . carvedilol  6.25 mg Oral BID WC  . cloNIDine  0.3 mg Transdermal Q Fri  . feeding supplement  1 Container Oral TID BM  . furosemide  120 mg Intravenous BID  . heparin  5,000 Units Subcutaneous Q8H  . hydrALAZINE  50 mg Oral TID  . insulin aspart  0-15 Units Subcutaneous TID WC  . insulin aspart  0-5 Units Subcutaneous QHS  . insulin glargine  10 Units Subcutaneous QHS  . isosorbide mononitrate  30 mg Oral Daily  . levothyroxine  200 mcg Oral Q0600  . omega-3 acid ethyl esters  1 g Oral Daily  . rosuvastatin  10 mg Oral QPM   Continuous Infusions:  PRN Meds: acetaminophen, guaiFENesin-dextromethorphan, ondansetron (ZOFRAN) IV, polyethylene glycol, polyvinyl alcohol, zolpidem   Vital Signs    Vitals:   04/30/20 0049 04/30/20 0339 04/30/20 0731 04/30/20 1314  BP:  (!) 152/62 (!) 145/70 (!) 149/60  Pulse: 70 66 60 70  Resp: 19 18 19 18   Temp:  98.4 F (36.9 C) 98.5 F (36.9 C) 98.5 F (36.9 C)  TempSrc:  Oral Oral Oral  SpO2:  100% 99% 97%  Weight:      Height:        Intake/Output Summary (Last 24 hours) at 04/30/2020 1525 Last data filed at 04/30/2020 0849 Gross per 24 hour  Intake 720 ml  Output 3350 ml  Net -2630 ml   Filed Weights   04/27/20 1354 04/28/20 0213 04/30/20 0044  Weight: 113.8 kg 113.2 kg 112.1 kg    Physical Exam   GEN: Well nourished, well developed in no acute distress NECK: No JVD CARDIAC: regular rhythm, normal S1 and S2, no rubs or gallops. No murmur. VASCULAR: Radial pulses 2+ bilaterally.  RESPIRATORY:  Clear to auscultation without rales, wheezing or rhonchi  ABDOMEN: Soft, non-tender,  non-distended MUSCULOSKELETAL:  Moves all 4 limbs independently SKIN: Warm and dry, bilateral LE edema NEUROLOGIC:  No focal neuro deficits noted. PSYCHIATRIC:  Normal affect   Labs    Chemistry Recent Labs  Lab 04/26/20 1746 04/27/20 0456 04/28/20 0414 04/29/20 0228 04/30/20 0416  NA  --    < > 140 138 139  K  --    < > 4.0 3.8 3.7  CL  --    < > 106 105 105  CO2  --    < > 23 24 23   GLUCOSE  --    < > 113* 162* 125*  BUN  --    < > 125* 121* 120*  CREATININE  --    < > 4.22* 4.25* 4.21*  CALCIUM  --    < > 9.0 9.2 9.4  PROT 6.4*  --   --   --   --   ALBUMIN 3.5  --   --   --   --   AST 27  --   --   --   --   ALT 29  --   --   --   --   ALKPHOS 112  --   --   --   --  BILITOT 0.5  --   --   --   --   GFRNONAA  --    < > 11* 11* 11*  ANIONGAP  --    < > 11 9 11    < > = values in this interval not displayed.     Hematology Recent Labs  Lab 04/26/20 0950 04/26/20 1724 04/27/20 0456  WBC  --  4.0 4.2  RBC  --  3.56* 3.69*  HGB 10.2* 9.5* 9.8*  HCT  --  30.6* 31.5*  MCV  --  86.0 85.4  MCH  --  26.7 26.6  MCHC  --  31.0 31.1  RDW  --  19.9* 19.9*  PLT  --  111* 111*    Cardiac EnzymesNo results for input(s): TROPONINI in the last 168 hours. No results for input(s): TROPIPOC in the last 168 hours.   BNP Recent Labs  Lab 04/26/20 1746  BNP 322.2*     DDimer No results for input(s): DDIMER in the last 168 hours.   Radiology    No results found. Telemetry   SR/SB - Personally Reviewed  ECG    No new tracing since 04/26/20 Personally Reviewed  Cardiac Studies   Echocardiogram 04/27/20:  1. Left ventricular ejection fraction, by estimation, is 65 to 70%. The  left ventricle has normal function. The left ventricle has no regional  wall motion abnormalities. There is severe concentric left ventricular  hypertrophy. Left ventricular diastolic  parameters are consistent with Grade II diastolic dysfunction  (pseudonormalization). Elevated left  atrial pressure.  2. Right ventricular systolic function is normal. The right ventricular  size is normal. There is mildly elevated pulmonary artery systolic  pressure. The estimated right ventricular systolic pressure is 34.1 mmHg.  3. Left atrial size was moderately dilated.  4. Right atrial size was mildly dilated.  5. A small pericardial effusion is present. The pericardial effusion is  circumferential. There is no evidence of cardiac tamponade.  6. The mitral valve is normal in structure. Mild mitral valve  regurgitation. Mild mitral stenosis. Severe mitral annular calcification.  7. The aortic valve is normal in structure. There is moderate  calcification of the aortic valve. There is moderate thickening of the  aortic valve. Aortic valve regurgitation is not visualized. Mild to  moderate aortic valve sclerosis/calcification is  present, without any evidence of aortic stenosis.  8. The inferior vena cava is dilated in size with <50% respiratory  variability, suggesting right atrial pressure of 15 mmHg.   Echocardiogram 02/02/2020:  1. Left ventricular ejection fraction, by estimation, is 65 to 70%. The  left ventricle has normal function. The left ventricle has no regional  wall motion abnormalities. There is severe left ventricular hypertrophy.  Left ventricular diastolic parameters  are consistent with Grade II diastolic dysfunction (pseudonormalization).  Elevated left atrial pressure.  2. Right ventricular systolic function is normal. The right ventricular  size is normal. There is mildly elevated pulmonary artery systolic  pressure. The estimated right ventricular systolic pressure is 96.2 mmHg.  3. A small pericardial effusion is present.  4. Left atrial size was severely dilated.  5. The mitral valve is abnormal. Severe mitral annular calcification.  Trivial mitral valve regurgitation. Moderate mitral stenosis. MG 8 mmHg at  HR 65 bpm, MVA 1.5 cm^2 by  continuity equation  6. The aortic valve is tricuspid. There is moderate thickening of the  aortic valve. Aortic valve regurgitation is not visualized. Mild aortic  valve stenosis. Vmax 2.9 m/s,  MG 6mmHg, AVA 1.9 cm^2, DI 0.6  7. The inferior vena cava is dilated in size with <50% respiratory  variability, suggesting right atrial pressure of 15 mmHg.   Gated nuclear stress test 01/26/2020:   The left ventricular ejection fraction is hyperdynamic (>65%).  Nuclear stress EF: 67%.  There was no ST segment deviation noted during stress.  No T wave inversion was noted during stress.  The study is normal.  This is a low risk study.   Patient Profile     70 y.o. female with a hx of a history of chronic diastolic CHF, hypertension, OSA on CPAP, DM2, CKD stage V who has refused dialysis in the past, anemia of chronic disease, HLD and hypothyroidism who is being seen for the management of CHF at the request of Dr. Maylene Roes.  Assessment & Plan    1.  Acute on chronic diastolic CHF: -Patient followed by Dr. Tamala Julian for her cardiology care.  She has a history of frequent ED admissions for CHF exacerbation.  She was most recently seen in the ED for similar symptoms 04/17/2020 treated with alternating torsemide dosing between 100/150 mg daily. She presented with worsening shortness of breath found to have a BNP with CXR consistent with CHF exacerbation. -net negative 8 L this admission, weight 112.1 kg today (admission wt 113.8 kg, ?accuracy) -Repeat echocardiogram 04/27/20 with EF normal, severe LVH, grade 2 diastolic dysfunction, RVSP 41 mmHg, small pericardial effusion, aortic sclerosis  -Continue with IV Lasix 120mg  BID -robitussin for cough, lungs are clear  2.  CKD stage V: -Creatinine, 4.21 -has declined dialysis, appreciate nephrology input.  3.  HTN:  -range 135/53-158/75 -Continue current regimen with amlodipine 10, carvedilol 6.25, clonidine patch 0.3 mg, Imdur 30 -No room to  increase carvedilol due to bradycardia  -Increased hydralazine to 50mg  TID this admission  4.  HLD: -Treated with Lovaza>>continue   5.  DM2: -HbA1c, 6.2 -SSI for glucose control while inpatient status -Continue Lantus -Management per IM  Other hospital problems include: -OSA -Hypothyroidism  Signed, Buford Dresser, MD, PhD, [facility]  [address], [place], [place] [address] [phone] 04/30/2020, 3:25 PM     For questions or updates, please contact   Please consult [email or web address] for contact info under Cardiology/STEMI.

## 2020-05-01 LAB — GLUCOSE, CAPILLARY
Glucose-Capillary: 89 mg/dL (ref 70–99)
Glucose-Capillary: 175 mg/dL — ABNORMAL HIGH (ref 70–99)
Glucose-Capillary: 139 mg/dL — ABNORMAL HIGH (ref 70–99)
Glucose-Capillary: 224 mg/dL — ABNORMAL HIGH (ref 70–99)

## 2020-05-01 LAB — BASIC METABOLIC PANEL
Anion gap: 8 (ref 5–15)
BUN: 115 mg/dL — ABNORMAL HIGH (ref 8–23)
CO2: 24 mmol/L (ref 22–32)
Calcium: 9.6 mg/dL (ref 8.9–10.3)
Chloride: 106 mmol/L (ref 98–111)
Creatinine, Ser: 4.05 mg/dL — ABNORMAL HIGH (ref 0.44–1.00)
GFR, Estimated: 11 mL/min — ABNORMAL LOW (ref >60)
Glucose, Bld: 104 mg/dL — ABNORMAL HIGH (ref 70–99)
Potassium: 4 mmol/L (ref 3.5–5.1)
Sodium: 138 mmol/L (ref 135–145)

## 2020-05-01 NOTE — Care Management Important Message (Signed)
Important Message  Patient Details  Name: Tiffany [name] MRN: [id number] Date of Birth: November 02, 1950   Medicare Important Message Given:  Yes     Shelda Altes 05/01/2020, 8:18 AM

## 2020-05-01 NOTE — Progress Notes (Signed)
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  -Declines dialysis - always has and no change in this setting; I follow her longitudinally.  -She is diuresing well - cont current dose for now -Seems to be developing some low grade uremic symptoms - if continue I would involve palliative care to facilitate hospice.  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    Acute on chronic diastolic heart failure -continue with IV lasix at 120mg  BID for today (on torsemide 100mg  daily at home, 150mg  for weight gain), her volume status looks like it is slowly improving. If needed can trial one dose of metolazone +/- increasing her lasix frequency to TID but I do not see a need to do that as this time. If she continues to improve, may just need to get her back on torsemide but at a higher dose given her degree of renal dysfunction (may need to alternate with 100mg  daily and 150mg  daily of torsemide) -cardiology following  Demand Ischemia -troponins peaked, cardio on board  Hypertension: BP acceptable, cont anti-HTNs  Secondary hyperparathyroidism, CKD-MBD -03/28/20 PTH 112, monitor phos. 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 Receiving retacrit 35k units q2weeks, last dose on 3/23. 3/24 Hb 9.8.     Diabetes Mellitus Type 2 with Hyperglycemia -mgmt per primary. Of note, she is chronically anemic so her hba1c may just falsely low  Subjective:   Still with dyspnea on exertion.  +nausea this AM - no breakfast; no emesis but nearly.  No dec mentation.   Diuresing well.    Objective:   BP (!) 148/51 (BP Location: Left Arm) Comment: 148/51  Pulse 60   Temp 98.4 F (36.9 C) (Oral)    Resp 18   Ht 5\' 10"  (1.778 m)   Wt 110.8 kg   SpO2 97%   BMI 35.04 kg/m   Intake/Output Summary (Last 24 hours) at 05/01/2020 1141 Last data filed at 05/01/2020 0600 Gross per 24 hour  Intake 360 ml  Output 2450 ml  Net -2090 ml   Weight change: -1.361 kg  Physical Exam: Gen: NAD, laying flat in bed CVS: S1-S2, RRR, no M/R/G Resp: CTA bilaterally, unlabored/normal work of breathing, speaking in full sentences Abd: Soft, nontender, nondistended Ext: 1+ pitting edema bilateral lower extremities Neuro: Speech clear and coherent, moves all extremity spontaneously, no asterixis  Imaging: No results found.  Labs: BMET Recent Labs  Lab 04/26/20 1724 04/27/20 0456 04/28/20 0414 04/29/20 0228 04/30/20 0416 05/01/20 0336  NA 140 139 140 138 139 138  K 4.6 4.4 4.0 3.8 3.7 4.0  CL 107 107 106 105 105 106  CO2 21* 22 23 24 23 24   GLUCOSE 171* 149* 113* 162* 125* 104*  BUN 131* 129* 125* 121* 120* 115*  CREATININE 4.10* 4.14* 4.22* 4.25* 4.21* 4.05*  CALCIUM 9.6 9.7 9.0 9.2 9.4 9.6   CBC Recent Labs  Lab 04/26/20 0950 04/26/20 1724 04/27/20 0456  WBC  --  4.0 4.2  HGB 10.2* 9.5* 9.8*  HCT  --  30.6* 31.5*  MCV  --  86.0 85.4  PLT  --  111* 111*    Medications:    . allopurinol  100 mg Oral BID  . amLODipine  10 mg Oral BID  . calcitRIOL  0.5 mcg Oral Daily  . carvedilol  6.25 mg Oral BID WC  . cloNIDine  0.3 mg Transdermal Q Fri  . feeding supplement  1 Container Oral TID BM  . furosemide  120 mg Intravenous BID  . heparin  5,000 Units Subcutaneous Q8H  . hydrALAZINE  50 mg Oral TID  . insulin aspart  0-15 Units Subcutaneous TID WC  . insulin aspart  0-5 Units Subcutaneous QHS  . insulin glargine  10 Units Subcutaneous QHS  . isosorbide mononitrate  30 mg Oral Daily  . levothyroxine  200 mcg Oral Q0600  . omega-3 acid ethyl esters  1 g Oral Daily  . rosuvastatin  10 mg Oral QPM     Jannifer Hick MD [facility] Pager [phone]

## 2020-05-01 NOTE — Progress Notes (Signed)
PROGRESS NOTE    Tiffany [name]  GXQ:[id number] DOB: 08-03-1950 DOA: 04/26/2020 PCP: Nolene Ebbs, MD     Brief Narrative:  Tiffany [name] is a 70 year old female with past medical history significant for chronic diastolic heart failure, hypertension, OSA on CPAP, insulin-dependent type 2 diabetes, hypothyroidism, CKD stage V and has refused dialysis in the past, anemia of chronic disease, hyperlipidemia who presents to the hospital with worsening shortness of breath.  She states that for the past several weeks, she has been having worsening shortness of breath with exertion, chest tightness, has been sleeping with 4 pillows, with peripheral edema.  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  She was evaluated in the emergency department on 3/14, received one-time dose of IV Lasix, increased home torsemide dose and discharged home.  She was also hospitalized in December for heart failure exacerbation. Cardiology and Nephrology consulted to assist with diuresis.   New events last 24 hours / Subjective: Continues to diurese well, 2.45 L in the last 24 hours. Yesterday had some nausea and headache across the top of her head. States she was able to eat a bit of dinner and walk the hallway last night, stopping 1-2 times. This morning feels slightly better.   Assessment & Plan:   Principal Problem:   Acute on chronic diastolic (congestive) heart failure (HCC) Active Problems:   Hypothyroidism   Obstructive sleep apnea   Essential hypertension   Chronic kidney disease, stage V (HCC)   Hyperlipidemia   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)   Acute on chronic diastolic heart failure -BNP 322.2 -Hold home torsemide -Repeat echocardiogram with EF 65 to 14%, grade 2 diastolic dysfunction -Continue IV Lasix -Strict I's and O's, daily weight -Cardiology following  AKI on CKD stage V -Baseline creatinine  around 3.5-4 -Patient was evaluated by nephrology in the past, patient has refused dialysis.  No urgent dialysis needed at this time -Nephrology following, creatinine remains stable for now at 4.05  Demand ischemia -Troponin 99 --> 80 -Secondary to above  Hypothyroidism -TSH elevated.  Free T4 elevated -Continue Synthroid, decreased Synthroid dose  -Follows with endocrinology as an outpatient  Type 2 diabetes, insulin-dependent, well controlled -Hemoglobin A1c 6.3 -Continue Lantus, sliding scale insulin  Hypertension -Continue amlodipine, Coreg, clonidine, hydralazine, imdur, Lasix  Hyperlipidemia -Continue crestor, lovaza   OSA -CPAP nightly   DVT prophylaxis:  heparin injection 5,000 Units Start: 04/26/20 2200  Code Status: DNR Family Communication: No family at bedside Disposition Plan:  Status is: Inpatient  Remains inpatient appropriate because:IV treatments appropriate due to intensity of illness or inability to take PO and Inpatient level of care appropriate due to severity of illness   Dispo: The patient is from: Home              Anticipated d/c is to: Home              Patient currently is not medically stable to d/c.  Continue IV Lasix.  Cardiology and nephrology following   Difficult to place patient No      Consultants:   Cardiology  Nephrology   Antimicrobials:  Anti-infectives (From admission, onward)   None       Objective: Vitals:   04/30/20 1725 04/30/20 2028 05/01/20 0348 05/01/20 0920  BP: (!) 147/72 (!) 145/62 (!) 150/68 (!) 148/51  Pulse:  61 (!) 57  Resp:  19 18   Temp:  98.4 F (36.9 C) 98.4 F (36.9 C)   TempSrc:  Oral Oral   SpO2:  100% 100%   Weight:   110.8 kg   Height:        Intake/Output Summary (Last 24 hours) at 05/01/2020 0941 Last data filed at 05/01/2020 0600 Gross per 24 hour  Intake 360 ml  Output 2450 ml  Net -2090 ml   Filed Weights   04/28/20 0213 04/30/20 0044 05/01/20 0348  Weight: 113.2 kg  112.1 kg 110.8 kg    Examination: General exam: Appears calm and comfortable  Respiratory system: Clear to auscultation. Respiratory effort normal. On room air.  Cardiovascular system: S1 & S2 heard, RRR. +Bilateral pitting pretibial edema. Gastrointestinal system: Abdomen is nondistended, soft and nontender. Normal bowel sounds heard. Central nervous system: Alert and oriented. Non focal exam. Speech clear  Extremities: Symmetric in appearance bilaterally  Skin: No rashes, lesions or ulcers on exposed skin  Psychiatry: Judgement and insight appear stable. Mood & affect appropriate.    Data Reviewed: I have personally reviewed following labs and imaging studies  CBC: Recent Labs  Lab 04/26/20 0950 04/26/20 1724 04/27/20 0456  WBC  --  4.0 4.2  HGB 10.2* 9.5* 9.8*  HCT  --  30.6* 31.5*  MCV  --  86.0 85.4  PLT  --  111* 952*   Basic Metabolic Panel: Recent Labs  Lab 04/27/20 0456 04/28/20 0414 04/29/20 0228 04/30/20 0416 05/01/20 0336  NA 139 140 138 139 138  K 4.4 4.0 3.8 3.7 4.0  CL 107 106 105 105 106  CO2 22 23 24 23 24   GLUCOSE 149* 113* 162* 125* 104*  BUN 129* 125* 121* 120* 115*  CREATININE 4.14* 4.22* 4.25* 4.21* 4.05*  CALCIUM 9.7 9.0 9.2 9.4 9.6   GFR: Estimated Creatinine Clearance: 17.7 mL/min (A) (by C-G formula based on SCr of 4.05 mg/dL (H)). Liver Function Tests: Recent Labs  Lab 04/26/20 1746  AST 27  ALT 29  ALKPHOS 112  BILITOT 0.5  PROT 6.4*  ALBUMIN 3.5   No results for input(s): LIPASE, AMYLASE in the last 168 hours. No results for input(s): AMMONIA in the last 168 hours. Coagulation Profile: No results for input(s): INR, PROTIME in the last 168 hours. Cardiac Enzymes: No results for input(s): CKTOTAL, CKMB, CKMBINDEX, TROPONINI in the last 168 hours. BNP (last 3 results) Recent Labs    01/25/20 1506  PROBNP 2,507*   HbA1C: No results for input(s): HGBA1C in the last 72 hours. CBG: Recent Labs  Lab 04/30/20 0621  04/30/20 1317 04/30/20 1612 04/30/20 2137 05/01/20 0603  GLUCAP 124* 128* 131* 219* 89   Lipid Profile: No results for input(s): CHOL, HDL, LDLCALC, TRIG, CHOLHDL, LDLDIRECT in the last 72 hours. Thyroid Function Tests: No results for input(s): TSH, T4TOTAL, FREET4, T3FREE, THYROIDAB in the last 72 hours. Anemia Panel: No results for input(s): VITAMINB12, FOLATE, FERRITIN, TIBC, IRON, RETICCTPCT in the last 72 hours. Sepsis Labs: No results for input(s): PROCALCITON, LATICACIDVEN in the last 168 hours.  Recent Results (from the past 240 hour(s))  Resp Panel by RT-PCR (Flu A&B, Covid) Nasopharyngeal Swab     Status: None   Collection Time: 04/26/20  8:42 PM   Specimen: Nasopharyngeal Swab; Nasopharyngeal(NP) swabs in vial transport medium  Result Value Ref Range Status   SARS Coronavirus 2 by RT PCR NEGATIVE NEGATIVE Final    Comment: (NOTE) SARS-CoV-2 target nucleic acids are NOT DETECTED.  The SARS-CoV-2 RNA is generally detectable in upper respiratory specimens during the acute phase of infection. The lowest concentration of SARS-CoV-2 viral copies this assay can detect is 138 copies/mL. A negative result does not preclude SARS-Cov-2 infection and should not be used as the sole basis for treatment or other patient management decisions. A negative result may occur with  improper specimen collection/handling, submission of specimen other than nasopharyngeal swab, presence of viral mutation(s) within the areas targeted by this assay, and inadequate number of viral copies(<138 copies/mL). A negative result must be combined with clinical observations, patient history, and epidemiological information. The expected result is Negative.  Fact Sheet for Patients:  EntrepreneurPulse.com.au  Fact Sheet for Healthcare Providers:  IncredibleEmployment.be  This test is no t yet approved or cleared by the [place] FDA and  has been authorized for  detection and/or diagnosis of SARS-CoV-2 by FDA under an Emergency Use Authorization (EUA). This EUA will remain  in effect (meaning this test can be used) for the duration of the COVID-19 declaration under Section 564(b)(1) of the Act, 21 U.S.C.section 360bbb-3(b)(1), unless the authorization is terminated  or revoked sooner.       Influenza A by PCR NEGATIVE NEGATIVE Final   Influenza B by PCR NEGATIVE NEGATIVE Final    Comment: (NOTE) The Xpert Xpress SARS-CoV-2/FLU/RSV plus assay is intended as an aid in the diagnosis of influenza from Nasopharyngeal swab specimens and should not be used as a sole basis for treatment. Nasal washings and aspirates are unacceptable for Xpert Xpress SARS-CoV-2/FLU/RSV testing.  Fact Sheet for Patients: EntrepreneurPulse.com.au  Fact Sheet for Healthcare Providers: IncredibleEmployment.be  This test is not yet approved or cleared by the [place] FDA and has been authorized for detection and/or diagnosis of SARS-CoV-2 by FDA under an Emergency Use Authorization (EUA). This EUA will remain in effect (meaning this test can be used) for the duration of the COVID-19 declaration under Section 564(b)(1) of the Act, 21 U.S.C. section 360bbb-3(b)(1), unless the authorization is terminated or revoked.  Performed at [facility], [place] [address], [place], [place] [address]       Radiology Studies: No results found.    Scheduled Meds: . allopurinol  100 mg Oral BID  . amLODipine  10 mg Oral BID  . calcitRIOL  0.5 mcg Oral Daily  . carvedilol  6.25 mg Oral BID WC  . cloNIDine  0.3 mg Transdermal Q Fri  . feeding supplement  1 Container Oral TID BM  . furosemide  120 mg Intravenous BID  . heparin  5,000 Units Subcutaneous Q8H  . hydrALAZINE  50 mg Oral TID  . insulin aspart  0-15 Units Subcutaneous TID WC  . insulin aspart  0-5 Units Subcutaneous QHS  . insulin glargine  10 Units Subcutaneous QHS   . isosorbide mononitrate  30 mg Oral Daily  . levothyroxine  200 mcg Oral Q0600  . omega-3 acid ethyl esters  1 g Oral Daily  . rosuvastatin  10 mg Oral QPM   Continuous Infusions:   LOS: 5 days      Time spent: 20  minutes   Dessa Phi, DO [facility] 05/01/2020, 9:41 AM   Available via Epic secure chat 7am-7pm After these hours, please refer to coverage provider listed on [email or web address]

## 2020-05-01 NOTE — Progress Notes (Addendum)
Progress Note  Patient Name: Tiffany [name] Date of Encounter: 05/01/2020  Primary Cardiologist: Sinclair Grooms, MD  Subjective   Tiffany [name] was a rough day with headaches, nausea and poor appetite but feeling a little better today. Walked in hall and stopped twice which is an improvement from prior - previously was SOB just walking to the door of the room.  Inpatient Medications    Scheduled Meds: . allopurinol  100 mg Oral BID  . amLODipine  10 mg Oral BID  . calcitRIOL  0.5 mcg Oral Daily  . carvedilol  6.25 mg Oral BID WC  . cloNIDine  0.3 mg Transdermal Q Fri  . feeding supplement  1 Container Oral TID BM  . furosemide  120 mg Intravenous BID  . heparin  5,000 Units Subcutaneous Q8H  . hydrALAZINE  50 mg Oral TID  . insulin aspart  0-15 Units Subcutaneous TID WC  . insulin aspart  0-5 Units Subcutaneous QHS  . insulin glargine  10 Units Subcutaneous QHS  . isosorbide mononitrate  30 mg Oral Daily  . levothyroxine  200 mcg Oral Q0600  . omega-3 acid ethyl esters  1 g Oral Daily  . rosuvastatin  10 mg Oral QPM   Continuous Infusions:  PRN Meds: acetaminophen, guaiFENesin-dextromethorphan, ondansetron (ZOFRAN) IV, polyethylene glycol, polyvinyl alcohol, zolpidem   Vital Signs    Vitals:   04/30/20 1725 04/30/20 2028 05/01/20 0348 05/01/20 0920  BP: (!) 147/72 (!) 145/62 (!) 150/68 (!) 148/51  Pulse:  61 (!) 57   Resp:  19 18   Temp:  98.4 F (36.9 C) 98.4 F (36.9 C)   TempSrc:  Oral Oral   SpO2:  100% 100%   Weight:   110.8 kg   Height:        Intake/Output Summary (Last 24 hours) at 05/01/2020 0951 Last data filed at 05/01/2020 0600 Gross per 24 hour  Intake 360 ml  Output 2450 ml  Net -2090 ml   Last 3 Weights 05/01/2020 04/30/2020 04/28/2020  Weight (lbs) 244 lb 3.2 oz 247 lb 3.2 oz 249 lb 9 oz  Weight (kg) 110.768 kg 112.129 kg 113.2 kg     Telemetry    NSR - Personally Reviewed  Physical Exam   GEN: No acute distress.  HEENT:  Normocephalic, atraumatic, sclera non-icteric. Neck: No JVD or bruits. Cardiac: RRR, 2/6 SEM across precordium, no rubs or gallops.  Respiratory: Clear to auscultation bilaterally. Breathing is unlabored. GI: Soft, nontender, non-distended, BS +x 4. MS: no deformity. Extremities: No clubbing or cyanosis. Bilateral mild soft lower extremity edema noted Neuro:  AAOx3. Follows commands. Psych:  Responds to questions appropriately with a normal affect.  Labs    High Sensitivity Troponin:   Recent Labs  Lab 04/26/20 1746 04/26/20 1946  TROPONINIHS 99* 80*      Cardiac EnzymesNo results for input(s): TROPONINI in the last 168 hours. No results for input(s): TROPIPOC in the last 168 hours.   Chemistry Recent Labs  Lab 04/26/20 1746 04/27/20 0456 04/29/20 0228 04/30/20 0416 05/01/20 0336  NA  --    < > 138 139 138  K  --    < > 3.8 3.7 4.0  CL  --    < > 105 105 106  CO2  --    < > 24 23 24   GLUCOSE  --    < > 162* 125* 104*  BUN  --    < > 121* 120* 115*  CREATININE  --    < > 4.25* 4.21* 4.05*  CALCIUM  --    < > 9.2 9.4 9.6  PROT 6.4*  --   --   --   --   ALBUMIN 3.5  --   --   --   --   AST 27  --   --   --   --   ALT 29  --   --   --   --   ALKPHOS 112  --   --   --   --   BILITOT 0.5  --   --   --   --   GFRNONAA  --    < > 11* 11* 11*  ANIONGAP  --    < > 9 11 8    < > = values in this interval not displayed.     Hematology Recent Labs  Lab 04/26/20 0950 04/26/20 1724 04/27/20 0456  WBC  --  4.0 4.2  RBC  --  3.56* 3.69*  HGB 10.2* 9.5* 9.8*  HCT  --  30.6* 31.5*  MCV  --  86.0 85.4  MCH  --  26.7 26.6  MCHC  --  31.0 31.1  RDW  --  19.9* 19.9*  PLT  --  111* 111*    BNP Recent Labs  Lab 04/26/20 1746  BNP 322.2*     DDimer No results for input(s): DDIMER in the last 168 hours.   Radiology    No results found.  Cardiac Studies    Echocardiogram 04/27/20:  1. Left ventricular ejection fraction, by estimation, is 65 to 70%. The  left  ventricle has normal function. The left ventricle has no regional  wall motion abnormalities. There is severe concentric left ventricular  hypertrophy. Left ventricular diastolic  parameters are consistent with Grade II diastolic dysfunction  (pseudonormalization). Elevated left atrial pressure.  2. Right ventricular systolic function is normal. The right ventricular  size is normal. There is mildly elevated pulmonary artery systolic  pressure. The estimated right ventricular systolic pressure is 98.3 mmHg.  3. Left atrial size was moderately dilated.  4. Right atrial size was mildly dilated.  5. A small pericardial effusion is present. The pericardial effusion is  circumferential. There is no evidence of cardiac tamponade.  6. The mitral valve is normal in structure. Mild mitral valve  regurgitation. Mild mitral stenosis. Severe mitral annular calcification.  7. The aortic valve is normal in structure. There is moderate  calcification of the aortic valve. There is moderate thickening of the  aortic valve. Aortic valve regurgitation is not visualized. Mild to  moderate aortic valve sclerosis/calcification is  present, without any evidence of aortic stenosis.  8. The inferior vena cava is dilated in size with <50% respiratory  variability, suggesting right atrial pressure of 15 mmHg.   Echocardiogram 02/02/2020:  1. Left ventricular ejection fraction, by estimation, is 65 to 70%. The  left ventricle has normal function. The left ventricle has no regional  wall motion abnormalities. There is severe left ventricular hypertrophy.  Left ventricular diastolic parameters  are consistent with Grade II diastolic dysfunction (pseudonormalization).  Elevated left atrial pressure.  2. Right ventricular systolic function is normal. The right ventricular  size is normal. There is mildly elevated pulmonary artery systolic  pressure. The estimated right ventricular systolic pressure is  38.2 mmHg.  3. A small pericardial effusion is present.  4. Left atrial size was severely dilated.  5. The mitral valve  is abnormal. Severe mitral annular calcification.  Trivial mitral valve regurgitation. Moderate mitral stenosis. MG 8 mmHg at  HR 65 bpm, MVA 1.5 cm^2 by continuity equation  6. The aortic valve is tricuspid. There is moderate thickening of the  aortic valve. Aortic valve regurgitation is not visualized. Mild aortic  valve stenosis. Vmax 2.9 m/s, MG 56mmHg, AVA 1.9 cm^2, DI 0.6  7. The inferior vena cava is dilated in size with <50% respiratory  variability, suggesting right atrial pressure of 15 mmHg.   Gated nuclear stress test 01/26/2020:   The left ventricular ejection fraction is hyperdynamic (>65%).  Nuclear stress EF: 67%.  There was no ST segment deviation noted during stress.  No T wave inversion was noted during stress.  The study is normal.  This is a low risk study.   Patient Profile     70 y.o. female with a history of chronic diastolic CHF, hypertension, OSA on CPAP, DM2, CKD stage V who has refused dialysis in the past, anemia of chronic disease, HLD and hypothyroidismwho is being seen for the management of CHF  Assessment & Plan    1. Acute on chronic diastolic CHF, managed in context of CKD stage V - repeat echocardiogram 04/27/20 with EF normal, severe LVH, grade 2 diastolic dysfunction, RVSP 41 mmHg, small pericardial effusion, aortic sclerosiswithout stenosis, severe mitral annular calcification, mild MR/MS - home dose of torsemide 100mg  daily (150mg  for weight gain) -> on IV Lasix 120mg  BID here - weight declining slowly (peak 250.88->244.2), -10L thus far - still with mild edema on exam but lungs clear - this is the lowest weight I can see in EMR for quite a long time - will discuss diuretic plan with MD (per nephrology, if needed, can trial one dose of metolazone +/- increase to TID but given current diuretic response does not  seem needed at this time) - no signs of tamponade, follow pericardial effusion conservatively  2. CKD stage V - patient declined HD - per nephrology note, if kidney function deteriorates or becomes fractory to diuretics, they would advocate for palliative care consult - avoid nephrotoxic agents  3. Essential HTN - managed with amlodipine 10mg , carvedilol 6.25mg  BID, clonidine patch, hydralazine 50mg  TID, Imdur 30mg  daily, diuretics - can consider further titration of hydralazine although need to be cautious not to decrease renal perfusion pressure aggressively  4. Hyperlipidemia - remains on home statin dose  5. Demand ischemia - mildly elevated troponins to 99, consistent with prior values as well - no plans for ACS eval at present time given need to avoid contrast due to CKD  Remainder of medical problems per primary team.  For questions or updates, please contact [facility] Please consult [email or web address] for contact info under Cardiology/STEMI.  Signed, Charlie Pitter, PA-C 05/01/2020, 9:51 AM    The patient was seen, examined and discussed with Melina Copa, PA-C and I agree with the above.   The patient feels significantly better today, she is down to 244 pounds, her baseline is 240, will continue IV Lasix for another day, plan for discharge tomorrow, her original home dose was 100 mg of torsemide daily, I will discharge on the same dose plus metolazone 2.5 mg p.o. 3 times a week on Tuesdays Thursdays and Saturdays.  Anticipated discharge tomorrow.  We will order TED hose, she has residual subcu edema in bilateral legs.  Ena Dawley, MD 05/01/2020

## 2020-05-02 LAB — BASIC METABOLIC PANEL
Anion gap: 10 (ref 5–15)
BUN: 110 mg/dL — ABNORMAL HIGH (ref 8–23)
CO2: 24 mmol/L (ref 22–32)
Calcium: 9.2 mg/dL (ref 8.9–10.3)
Chloride: 104 mmol/L (ref 98–111)
Creatinine, Ser: 3.91 mg/dL — ABNORMAL HIGH (ref 0.44–1.00)
GFR, Estimated: 12 mL/min — ABNORMAL LOW (ref >60)
Glucose, Bld: 132 mg/dL — ABNORMAL HIGH (ref 70–99)
Potassium: 3.9 mmol/L (ref 3.5–5.1)
Sodium: 138 mmol/L (ref 135–145)

## 2020-05-02 LAB — GLUCOSE, CAPILLARY
Glucose-Capillary: 125 mg/dL — ABNORMAL HIGH (ref 70–99)
Glucose-Capillary: 166 mg/dL — ABNORMAL HIGH (ref 70–99)

## 2020-05-02 MED ORDER — TORSEMIDE 100 MG PO TABS: 100.0000 mg | ORAL_TABLET | Freq: Every day | ORAL | 2 refills | Status: DC

## 2020-05-02 MED ORDER — HYDRALAZINE HCL 50 MG PO TABS: 50.0000 mg | ORAL_TABLET | Freq: Three times a day (TID) | ORAL | 2 refills | Status: DC

## 2020-05-02 MED ORDER — ISOSORBIDE MONONITRATE ER 60 MG PO TB24: 60.0000 mg | ORAL_TABLET | Freq: Every day | ORAL | 2 refills | Status: DC

## 2020-05-02 MED ORDER — METOLAZONE 5 MG PO TABS
5.0000 mg | ORAL_TABLET | ORAL | 2 refills | Status: DC
Start: 2020-05-02 — End: 2020-06-07

## 2020-05-02 MED ORDER — TORSEMIDE 100 MG PO TABS
100.0000 mg | ORAL_TABLET | Freq: Every day | ORAL | Status: DC
  Administered 2020-05-02: 100 mg via ORAL
  Filled 2020-05-02: qty 1

## 2020-05-02 MED ORDER — ISOSORBIDE MONONITRATE ER 60 MG PO TB24
60.0000 mg | ORAL_TABLET | Freq: Every day | ORAL | Status: DC
Start: 2020-05-03 — End: 2020-05-02

## 2020-05-02 MED ORDER — LEVOTHYROXINE SODIUM 200 MCG PO TABS
200.0000 ug | ORAL_TABLET | Freq: Every day | ORAL | 2 refills | Status: DC
Start: 2020-05-02 — End: 2020-06-07

## 2020-05-02 MED ORDER — METOLAZONE 5 MG PO TABS
5.0000 mg | ORAL_TABLET | ORAL | Status: DC
  Administered 2020-05-02: 5 mg via ORAL
  Filled 2020-05-02: qty 1

## 2020-05-02 NOTE — TOC Initial Note (Signed)
Transition of Care [facility]) - Initial/Assessment Note    Patient Details  Name: Tiffany [name] MRN: [id number] Date of Birth: 20-Apr-1950  [facility]) CM/SW Contact:    Zenon Mayo, RN Phone Number: 05/02/2020, 12:20 PM  Clinical Narrative:                 Patient is for dc today, she has a walker at home , she has transportation to go home at dc.  She has no other needs.  Expected Discharge Plan: Home/Self Care Barriers to Discharge: No Barriers Identified   Patient Goals and CMS Choice Patient states their goals for this hospitalization and ongoing recovery are:: return home   Choice offered to / list presented to : NA  Expected Discharge Plan and Services Expected Discharge Plan: Home/Self Care In-house Referral: NA Discharge Planning Services: CM Consult Post Acute Care Choice: NA Living arrangements for the past 2 months: Single Family Home Expected Discharge Date: 05/02/20                 DME Agency: NA       HH Arranged: NA          Prior Living Arrangements/Services Living arrangements for the past 2 months: Single Family Home Lives with:: Self Patient language and need for interpreter reviewed:: Yes        Need for Family Participation in Patient Care: No (Comment) Care giver support system in place?: Yes (comment) Current home services: DME (has a walker) Criminal Activity/Legal Involvement Pertinent to Current Situation/Hospitalization: No - Comment as needed  Activities of Daily Living Home Assistive Devices/Equipment: Eyeglasses ADL Screening (condition at time of admission) Patient's cognitive ability adequate to safely complete daily activities?: Yes Is the patient deaf or have difficulty hearing?: No Does the patient have difficulty seeing, even when wearing glasses/contacts?: No Does the patient have difficulty concentrating, remembering, or making decisions?: No Patient able to express need for assistance with ADLs?:  Yes Does the patient have difficulty dressing or bathing?: No Independently performs ADLs?: Yes (appropriate for developmental age) Does the patient have difficulty walking or climbing stairs?: Yes Weakness of Legs: Both Weakness of Arms/Hands: None  Permission Sought/Granted                  Emotional Assessment   Attitude/Demeanor/Rapport: Engaged Affect (typically observed): Appropriate Orientation: : Oriented to Self,Oriented to Place,Oriented to  Time,Oriented to Situation Alcohol / Substance Use: Not Applicable Psych Involvement: No (comment)  Admission diagnosis:  CKD (chronic kidney disease) stage 5, GFR less than 15 ml/min (HCC) [N18.5] Chronic anemia [D64.9] Acute on chronic diastolic (congestive) heart failure (HCC) [I50.33] Acute on chronic congestive heart failure, unspecified heart failure type ([place]) [I50.9] Patient Active Problem List   Diagnosis Date Noted  . Acute on chronic diastolic (congestive) heart failure ([place]) 04/26/2020  . Type 2 diabetes mellitus with stage 5 chronic kidney disease ([place]) 04/26/2020  . Acute CHF (congestive heart failure) ([place]) 02/01/2020  . S/P left TKA 07/01/2019  . Status post total left knee replacement 07/01/2019  . Hyperlipidemia 09/12/2016  . Type 2 diabetes mellitus without complications ([place]) 99/83/3825  . Tachycardia 08/26/2016  . Palpitation 08/26/2016  . SOB (shortness of breath) 08/26/2016  . Chronic kidney disease, stage V ([place]) 04/28/2014  . Chronic diastolic heart failure ([place]) 05/05/2013  . Essential hypertension 05/05/2013  . HEART MURMUR, SYSTOLIC 05/39/7673  . Hypothyroidism 02/12/2007  . OBESITY 01/08/2007  . Obstructive sleep apnea 01/08/2007  . Seasonal  and perennial allergic rhinitis 01/08/2007   PCP:  Nolene Ebbs, MD Pharmacy:   [id number] - [place], [place] [address] [place] [address] Phone: [phone] Fax:  [phone]     Social Determinants of Health (SDOH) Interventions    Readmission Risk Interventions Readmission Risk Prevention Plan 05/02/2020  Transportation Screening Complete  PCP or Specialist Appt within 3-5 Days Complete  Social Work Consult for [place] Planning/Counseling Complete  Palliative Care Screening Not Applicable  Medication Review Press photographer) Complete  Some recent data might be hidden

## 2020-05-02 NOTE — Progress Notes (Addendum)
Progress Note  Patient Name: Tiffany [name] Date of Encounter: 05/02/2020  Primary Cardiologist: Sinclair Grooms, MD  Subjective   Feeling much better today, feels well enough for discharge. No CP. Less SOB.  Inpatient Medications    Scheduled Meds: . allopurinol  100 mg Oral BID  . amLODipine  10 mg Oral BID  . calcitRIOL  0.5 mcg Oral Daily  . carvedilol  6.25 mg Oral BID WC  . cloNIDine  0.3 mg Transdermal Q Fri  . feeding supplement  1 Container Oral TID BM  . furosemide  120 mg Intravenous BID  . heparin  5,000 Units Subcutaneous Q8H  . hydrALAZINE  50 mg Oral TID  . insulin aspart  0-15 Units Subcutaneous TID WC  . insulin aspart  0-5 Units Subcutaneous QHS  . insulin glargine  10 Units Subcutaneous QHS  . isosorbide mononitrate  30 mg Oral Daily  . levothyroxine  200 mcg Oral Q0600  . omega-3 acid ethyl esters  1 g Oral Daily  . rosuvastatin  10 mg Oral QPM   Continuous Infusions:  PRN Meds: acetaminophen, guaiFENesin-dextromethorphan, ondansetron (ZOFRAN) IV, polyethylene glycol, polyvinyl alcohol, zolpidem   Vital Signs    Vitals:   05/01/20 1709 05/01/20 2032 05/02/20 0509 05/02/20 0517  BP: (!) 148/65 (!) 141/76  (!) 165/76  Pulse: 64 63  61  Resp: 18 18  16   Temp:  97.7 F (36.5 C)  98.7 F (37.1 C)  TempSrc:  Oral  Oral  SpO2:  98%  96%  Weight:   111.1 kg   Height:        Intake/Output Summary (Last 24 hours) at 05/02/2020 1005 Last data filed at 05/01/2020 2300 Gross per 24 hour  Intake 240 ml  Output 1900 ml  Net -1660 ml   Last 3 Weights 05/02/2020 05/01/2020 04/30/2020  Weight (lbs) 245 lb 244 lb 3.2 oz 247 lb 3.2 oz  Weight (kg) 111.131 kg 110.768 kg 112.129 kg     Telemetry    NSR - Personally Reviewed   Physical Exam   GEN: No acute distress.  HEENT: Normocephalic, atraumatic, sclera non-icteric. Neck: No JVD or bruits. Cardiac: RRR soft SEM no rubs or gallops.  Respiratory: Clear to auscultation bilaterally. Breathing  is unlabored. GI: Soft, nontender, non-distended, BS +x 4. MS: no deformity. Extremities: No clubbing or cyanosis. Trace mild BLE edema. Distal pedal pulses are 2+ and equal bilaterally. Neuro:  AAOx3. Follows commands. Psych:  Responds to questions appropriately with a normal affect.  Labs    High Sensitivity Troponin:   Recent Labs  Lab 04/26/20 1746 04/26/20 1946  TROPONINIHS 99* 80*      Cardiac EnzymesNo results for input(s): TROPONINI in the last 168 hours. No results for input(s): TROPIPOC in the last 168 hours.   Chemistry Recent Labs  Lab 04/26/20 1746 04/27/20 0456 04/30/20 0416 05/01/20 0336 05/02/20 0421  NA  --    < > 139 138 138  K  --    < > 3.7 4.0 3.9  CL  --    < > 105 106 104  CO2  --    < > 23 24 24   GLUCOSE  --    < > 125* 104* 132*  BUN  --    < > 120* 115* 110*  CREATININE  --    < > 4.21* 4.05* 3.91*  CALCIUM  --    < > 9.4 9.6 9.2  PROT 6.4*  --   --   --   --  ALBUMIN 3.5  --   --   --   --   AST 27  --   --   --   --   ALT 29  --   --   --   --   ALKPHOS 112  --   --   --   --   BILITOT 0.5  --   --   --   --   GFRNONAA  --    < > 11* 11* 12*  ANIONGAP  --    < > 11 8 10    < > = values in this interval not displayed.     Hematology Recent Labs  Lab 04/26/20 0950 04/26/20 1724 04/27/20 0456  WBC  --  4.0 4.2  RBC  --  3.56* 3.69*  HGB 10.2* 9.5* 9.8*  HCT  --  30.6* 31.5*  MCV  --  86.0 85.4  MCH  --  26.7 26.6  MCHC  --  31.0 31.1  RDW  --  19.9* 19.9*  PLT  --  111* 111*    BNP Recent Labs  Lab 04/26/20 1746  BNP 322.2*     DDimer No results for input(s): DDIMER in the last 168 hours.   Radiology    No results found.  Cardiac Studies   Echocardiogram 04/27/20:  1. Left ventricular ejection fraction, by estimation, is 65 to 70%. The  left ventricle has normal function. The left ventricle has no regional  wall motion abnormalities. There is severe concentric left ventricular  hypertrophy. Left ventricular  diastolic  parameters are consistent with Grade II diastolic dysfunction  (pseudonormalization). Elevated left atrial pressure.  2. Right ventricular systolic function is normal. The right ventricular  size is normal. There is mildly elevated pulmonary artery systolic  pressure. The estimated right ventricular systolic pressure is 51.7 mmHg.  3. Left atrial size was moderately dilated.  4. Right atrial size was mildly dilated.  5. A small pericardial effusion is present. The pericardial effusion is  circumferential. There is no evidence of cardiac tamponade.  6. The mitral valve is normal in structure. Mild mitral valve  regurgitation. Mild mitral stenosis. Severe mitral annular calcification.  7. The aortic valve is normal in structure. There is moderate  calcification of the aortic valve. There is moderate thickening of the  aortic valve. Aortic valve regurgitation is not visualized. Mild to  moderate aortic valve sclerosis/calcification is  present, without any evidence of aortic stenosis.  8. The inferior vena cava is dilated in size with <50% respiratory  variability, suggesting right atrial pressure of 15 mmHg.   Echocardiogram 02/02/2020:  1. Left ventricular ejection fraction, by estimation, is 65 to 70%. The  left ventricle has normal function. The left ventricle has no regional  wall motion abnormalities. There is severe left ventricular hypertrophy.  Left ventricular diastolic parameters  are consistent with Grade II diastolic dysfunction (pseudonormalization).  Elevated left atrial pressure.  2. Right ventricular systolic function is normal. The right ventricular  size is normal. There is mildly elevated pulmonary artery systolic  pressure. The estimated right ventricular systolic pressure is 61.6 mmHg.  3. A small pericardial effusion is present.  4. Left atrial size was severely dilated.  5. The mitral valve is abnormal. Severe mitral annular  calcification.  Trivial mitral valve regurgitation. Moderate mitral stenosis. MG 8 mmHg at  HR 65 bpm, MVA 1.5 cm^2 by continuity equation  6. The aortic valve is tricuspid. There is moderate thickening of  the  aortic valve. Aortic valve regurgitation is not visualized. Mild aortic  valve stenosis. Vmax 2.9 m/s, MG 62mmHg, AVA 1.9 cm^2, DI 0.6  7. The inferior vena cava is dilated in size with <50% respiratory  variability, suggesting right atrial pressure of 15 mmHg.   Gated nuclear stress test 01/26/2020:   The left ventricular ejection fraction is hyperdynamic (>65%).  Nuclear stress EF: 67%.  There was no ST segment deviation noted during stress.  No T wave inversion was noted during stress.  The study is normal.  This is a low risk study.   Patient Profile     70 y.o. female with a history of chronic diastolic CHF, hypertension, OSA on CPAP, DM2, CKD stage V who has refused dialysis in the past, anemia of chronic disease, HLD and hypothyroidismwho is being seen for themanagementof CHF  Assessment & Plan    1. Acute on chronic diastolic CHF, managed in context of CKD stage V - repeat echocardiogram 04/27/20 with EF normal, severe LVH, grade 2 diastolic dysfunction, RVSP 41 mmHg, small pericardial effusion, aortic sclerosiswithout stenosis, severe mitral annular calcification, mild MR/MS - home dose of torsemide 100mg  daily (150mg  for weight gain) -> on IV Lasix 120mg  BID this admission - weight today 245lb, -11.9L total so far - Dr. Johnney Ou (nephrology) recommends to continue torsemide at 150mg  daily and if weight up can take metolazone 5mg  (Dr. Francesca Oman note yesterday mentioned 3x/weekly dosing, await Dr. Francesca Oman input) - per Dr. Caprice Red note, she has an appt with the patient on 4/4 - we also have post-hospital cardiology follow-up arranged 4/5 - regarding pericardial effusion, no signs of tamponade, follow pericardial effusion conservatively  2. CKD stage V -  patient declined HD - per nephrology note, if kidney function deteriorates or becomes fractory to diuretics, they would advocate for palliative care consult - avoid nephrotoxic agents  3. Essential HTN - managed with amlodipine 10mg , carvedilol 6.25mg  BID, clonidine patch, hydralazine 50mg  TID, Imdur 30mg  daily, diuretics - can consider further titration of hydralazine although need to be cautious not to decrease renal perfusion pressure aggressively, will defer to nephrology  4. Hyperlipidemia - remains on home statin dose  5. Demand ischemia - mildly elevated troponins to 99, consistent with prior values as well - no plans for ACS eval at present time given need to avoid contrast due to CKD  Remainder of medical problems per primary team.  For questions or updates, please contact [facility] Please consult [email or web address] for contact info under Cardiology/STEMI.  Signed, Charlie Pitter, PA-C 05/02/2020, 10:06 AM    The patient was seen, examined and discussed with Melina Copa, PA-C and I agree with the above.   The patient feels significantly better, still not back to her baseline but really wants to go home, her weight is down 1.7 L since yesterday however her weight is up by 1 pound, we will discharge home on home torsemide 100 mg daily and metolazone 5 mg on Tuesdays Thursdays and Saturdays.  Her blood pressure remains elevated and would increase Imdur to 60 mg daily.  We will arrange for follow-up in 2 weeks.  Ena Dawley, MD 05/02/2020

## 2020-05-02 NOTE — TOC Transition Note (Signed)
Transition of Care [facility]) - CM/SW Discharge Note   Patient Details  Name: Iviana Blasingame MRN: [id number] Date of Birth: 05-Aug-1950  Transition of Care [facility]) CM/SW Contact:  Zenon Mayo, RN Phone Number: 05/02/2020, 12:29 PM   Clinical Narrative:    Patient is for dc today, she has a walker at home , she has transportation to go home at dc.  She has no other needs.   Final next level of care: Home/Self Care Barriers to Discharge: No Barriers Identified   Patient Goals and CMS Choice Patient states their goals for this hospitalization and ongoing recovery are:: return home   Choice offered to / list presented to : NA  Discharge Placement                       Discharge Plan and Services In-house Referral: NA Discharge Planning Services: CM Consult Post Acute Care Choice: NA            DME Agency: NA       HH Arranged: NA          Social Determinants of Health (SDOH) Interventions     Readmission Risk Interventions Readmission Risk Prevention Plan 05/02/2020  Transportation Screening Complete  PCP or Specialist Appt within 3-5 Days Complete  Social Work Consult for [place] Planning/Counseling [place] Not Applicable  Medication Review Press photographer) Complete  Some recent data might be hidden

## 2020-05-02 NOTE — Progress Notes (Signed)
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  -Declines dialysis - always has and no change in this setting; I follow her longitudinally.  -She has diuresed well - net neg 11L for admission -Seems to be developing some low grade uremic symptoms but not profound -She wishes to discharge - she has appt with me 05/08/20 so I think she should go today, see below for diuretic dosing.  If she's having more uremic symptoms then I"ll involve hospice but for now she wishes to continue with current care.    Acute on chronic diastolic heart failure -continue torsemide 150mg  daily -Daily weights - weigh when she gets home today then qam.  If wt up take metolazone 5mg  with her torsemide. Please Rx a 30d supply for her on discharge. This is a new medication. -cardiology following  Demand Ischemia -troponins peaked, cardio on board  Hypertension: BP typically acceptable - high this AM but lying down.  Follow at clinic [place].   Secondary hyperparathyroidism, CKD-MBD -03/28/20 PTH 112, monitor phos. 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 Receiving retacrit 35k units q2weeks, last dose on 3/23. 3/24 Hb 9.8.     Diabetes Mellitus Type 2 with Hyperglycemia -mgmt per primary. Of note, she is chronically anemic so her hba1c may just falsely low  Subjective:   Still with dyspnea on exertion but much improved.  Less nausea this AM.  Really wants to go home. No dec mentation.   Net neg 1.7L yesterday.    Objective:   BP (!) 165/76 (BP Location: Right Wrist)   Pulse 61   Temp 98.7 F (37.1 C) (Oral)   Resp 16   Ht 5\' 10"  (1.778 m)   Wt 111.1 kg   SpO2 96%   BMI 35.15 kg/m   Intake/Output Summary (Last 24 hours) at 05/02/2020 0846 Last data filed at 05/01/2020 2300 Gross per 24 hour  Intake 240 ml  Output 1900 ml  Net -1660 ml    Weight change: 0.363 kg  Physical Exam: Gen: NAD, laying flat in bed CVS: S1-S2, RRR, no M/R/G Resp: CTA bilaterally, unlabored/normal work of breathing, speaking in full sentences Abd: Soft, nontender, nondistended Ext: no edema bilateral lower extremities Neuro: Speech clear and coherent, moves all extremity spontaneously, no asterixis  Imaging: No results found.  Labs: BMET Recent Labs  Lab 04/26/20 1724 04/27/20 0456 04/28/20 0414 04/29/20 0228 04/30/20 0416 05/01/20 0336 05/02/20 0421  NA 140 139 140 138 139 138 138  K 4.6 4.4 4.0 3.8 3.7 4.0 3.9  CL 107 107 106 105 105 106 104  CO2 21* 22 23 24 23 24 24   GLUCOSE 171* 149* 113* 162* 125* 104* 132*  BUN 131* 129* 125* 121* 120* 115* 110*  CREATININE 4.10* 4.14* 4.22* 4.25* 4.21* 4.05* 3.91*  CALCIUM 9.6 9.7 9.0 9.2 9.4 9.6 9.2   CBC Recent Labs  Lab 04/26/20 0950 04/26/20 1724 04/27/20 0456  WBC  --  4.0 4.2  HGB 10.2* 9.5* 9.8*  HCT  --  30.6* 31.5*  MCV  --  86.0 85.4  PLT  --  111* 111*    Medications:    . allopurinol  100 mg Oral BID  . amLODipine  10 mg Oral BID  . calcitRIOL  0.5 mcg Oral Daily  . carvedilol  6.25 mg Oral BID WC  . cloNIDine  0.3 mg Transdermal Q Fri  . feeding supplement  1 Container Oral TID BM  . furosemide  120 mg Intravenous BID  . heparin  5,000 Units Subcutaneous Q8H  . hydrALAZINE  50 mg Oral TID  . insulin aspart  0-15 Units Subcutaneous TID WC  . insulin aspart  0-5 Units Subcutaneous QHS  . insulin glargine  10 Units Subcutaneous QHS  . isosorbide mononitrate  30 mg Oral Daily  . levothyroxine  200 mcg Oral Q0600  . omega-3 acid ethyl esters  1 g Oral Daily  . rosuvastatin  10 mg Oral QPM     Jannifer Hick MD [facility] Pager [phone]

## 2020-05-02 NOTE — Progress Notes (Signed)
Cardiology Office Note    Date:  05/09/2020   ID:  Tiffany, [name] 03-20-1950, MRN [id number]   PCP:  Nolene Ebbs, MD   [place]  Cardiologist:  Sinclair Grooms, MD  Advanced Practice Provider:  No care team member to display Electrophysiologist:  None   [id number]}   Chief Complaint  Patient presents with  . Hospitalization Follow-up    History of Present Illness:  Tiffany [name] is a 70 y.o. female with history of chronic diastolic CHF, hypertension, CKD stage V has refused dialysis, HLD, OSA on CPAP.    Patient was discharged from the hospital 05/02/20 after admission with acute on chronic diastolic CHF.  Patient was sent home on torsemide 100 mg daily and metolazone 5 mg Tuesday Thursday Saturday.  Nephrology recommended 150 mg of torsemide and then metolazone 5 mg as needed.  Patient's Imdur was increased to 60 mg daily for better blood pressure control.    Patient's comes in for f/u. Saw renal yesterday and was referred to hospice since she refuses dialysis. Diuretics weren't changed. She is firm in her decision.       Past Medical History:  Diagnosis Date  . ALLERGIC RHINITIS   . Arthritis   . CHF (congestive heart failure) ([place])   . Chronic diastolic heart failure ([place]) 05/05/2013   Echo 03/2018:  EF 41-66, +diastolic dysfunction, GLS -20.5%, normal RVSF, RVSP 42.5 (mild elevated), severe LAE, small pericardial eff, mild MAc, mod AV calc, mild PI  . CKD (chronic kidney disease)   . Diabetes mellitus   . Dyspnea   . Heart murmur    at birth  . Hyperlipidemia   . Hypertension   . Hypothyroid   . Iron deficiency anemia   . Obesity   . OSA on CPAP   . Previous back surgery    x 2  . Refusal of blood transfusions as patient is Jehovah's Witness   . S/P arthroscopy     Past Surgical History:  Procedure Laterality Date  . BREAST BIOPSY  20+ yrs ago   dont know which breast per pt; benign  . BREAST REDUCTION SURGERY  1982   . KNEE SURGERY     x 2  . LUMBAR DISC SURGERY     x 2  . REDUCTION MAMMAPLASTY Bilateral 1985  . TOE SURGERY    . TOTAL ABDOMINAL HYSTERECTOMY  1997   partial hysterectomy- still has ovaries  . TOTAL KNEE ARTHROPLASTY Left 07/01/2019   Procedure: TOTAL KNEE ARTHROPLASTY;  Surgeon: Paralee Cancel, MD;  Location: [facility];  Service: Orthopedics;  Laterality: Left;  70 min NO BLOOD PRODUCTS!!  . TREATMENT FISTULA ANAL      Current Medications: Current Meds  Medication Sig  . acetaminophen (TYLENOL) 650 MG CR tablet Take 650 mg by mouth every 8 (eight) hours as needed for pain.  Marland Kitchen albuterol (VENTOLIN HFA) 108 (90 Base) MCG/ACT inhaler Inhale 1-2 puffs into the lungs every 6 (six) hours as needed for wheezing or shortness of breath.  . allopurinol (ZYLOPRIM) 100 MG tablet Take 100 mg by mouth 2 (two) times daily.  Marland Kitchen amLODipine (NORVASC) 10 MG tablet Take 10 mg by mouth 2 (two) times daily.   . B Complex Vitamins (B COMPLEX PO) Take 1 tablet by mouth daily.  . B-D INS SYRINGE 0.5CC/30GX1/2" 30G X 1/2" 0.5 ML MISC TAKE AS DIRECTED TWICE A DAY  . BIOTIN 5000 PO Take 5,000 mcg by mouth  daily.   . calcitRIOL (ROCALTROL) 0.25 MCG capsule Take 0.5 mcg by mouth daily.  . carvedilol (COREG) 6.25 MG tablet Take 6.25 mg by mouth in the morning and at bedtime.  . cetirizine (ZYRTEC) 10 MG tablet Take 10 mg by mouth daily as needed for allergies.  . cloNIDine (CATAPRES - DOSED IN MG/24 HR) 0.3 mg/24hr Place 0.3 mg onto the skin every Friday.   . Coenzyme Q10 100 MG capsule Take 100 mg by mouth daily.  . hydrALAZINE (APRESOLINE) 50 MG tablet Take 1 tablet (50 mg total) by mouth 3 (three) times daily.  . insulin glargine (LANTUS) 100 UNIT/ML injection Inject 10 Units into the skin at bedtime.  . isosorbide mononitrate (IMDUR) 60 MG 24 hr tablet Take 1 tablet (60 mg total) by mouth daily.  Marland Kitchen levothyroxine (SYNTHROID) 200 MCG tablet Take 1 tablet (200 mcg total) by mouth daily before breakfast.  .  metolazone (ZAROXOLYN) 5 MG tablet Take 1 tablet (5 mg total) by mouth 3 (three) times a week. Tuesdays Thursdays and Saturdays  . Multiple Vitamins-Minerals (PRESERVISION AREDS PO) Take 1 tablet by mouth in the morning and at bedtime.  . NON FORMULARY Take 1 tablet by mouth daily. Renadyl for kidneys  . Omega-3 Fatty Acids (FISH OIL) 500 MG CAPS Take 500 mg by mouth daily in the afternoon.  . polyethylene glycol (MIRALAX / GLYCOLAX) 17 g packet Take 17 g by mouth 2 (two) times daily. (Patient taking differently: Take 17 g by mouth daily as needed for mild constipation.)  . rosuvastatin (CRESTOR) 10 MG tablet Take 10 mg by mouth every evening.  Marland Kitchen SYSTANE COMPLETE 0.6 % SOLN Place 1 drop into both eyes in the morning, at noon, in the evening, and at bedtime.  . torsemide (DEMADEX) 100 MG tablet Take 1 tablet (100 mg total) by mouth daily.  . TURMERIC PO Take 1 tablet by mouth daily.  Marland Kitchen zolpidem (AMBIEN) 5 MG tablet 1 at bedtime as needed for sleep (Patient taking differently: Take 5 mg by mouth at bedtime as needed for sleep. 1 at bedtime as needed for sleep)     Allergies:   Patient has no known allergies.   Social History   Socioeconomic History  . Marital status: Single    Spouse name: Not on file  . Number of children: Not on file  . Years of education: Not on file  . Highest education level: Not on file  Occupational History  . Occupation: school bus driver  Tobacco Use  . Smoking status: Never Smoker  . Smokeless tobacco: Never Used  Vaping Use  . Vaping Use: Never used  Substance and Sexual Activity  . Alcohol use: Yes    Alcohol/week: 0.0 standard drinks    Comment: rare  . Drug use: No  . Sexual activity: Not Currently    Birth control/protection: Surgical  Other Topics Concern  . Not on file  Social History Narrative  . Not on file   Social Determinants of Health   Financial Resource Strain: Not on file  Food Insecurity: Not on file  Transportation Needs: Not on  file  Physical Activity: Not on file  Stress: Not on file  Social Connections: Not on file     Family History:  The patient's family history includes Congestive Heart Failure in her mother; Diabetes in her sister; Other in her sister; Prostate cancer in her father.   ROS:   Please see the history of present illness.    ROS  All other systems reviewed and are negative.   PHYSICAL EXAM:   VS:  BP 130/62   Pulse 68   Ht 5' 9.5" (1.765 m)   Wt 238 lb (108 kg)   SpO2 100%   BMI 34.64 kg/m   Physical Exam  GEN: Well nourished, well developed, in no acute distress  Neck: no JVD, carotid bruits, or masses [id number];9/4 systolic murmur LSB Respiratory:  clear to auscultation bilaterally, normal work of breathing GI: soft, nontender, nondistended, + BS Ext: trace edema otherwise without cyanosis, clubbing,  Good distal pulses bilaterally Neuro:  Alert and Oriented x 3 Psych: euthymic mood, full affect  Wt Readings from Last 3 Encounters:  05/09/20 238 lb (108 kg)  05/02/20 245 lb (111.1 kg)  04/17/20 251 lb 5.2 oz (114 kg)      Studies/Labs Reviewed:   EKG:  EKG is not ordered today.    Recent Labs: 01/25/2020: NT-Pro BNP 2,507 04/26/2020: ALT 29; B Natriuretic Peptide 322.2; TSH 10.113 04/27/2020: Hemoglobin 9.8; Platelets 111 05/02/2020: BUN 110; Creatinine, Ser 3.91; Potassium 3.9; Sodium 138   Lipid Panel No results found for: CHOL, TRIG, HDL, CHOLHDL, VLDL, LDLCALC, LDLDIRECT  Additional studies/ records that were reviewed today include:    Echocardiogram 04/27/20:   1. Left ventricular ejection fraction, by estimation, is 65 to 70%. The  left ventricle has normal function. The left ventricle has no regional  wall motion abnormalities. There is severe concentric left ventricular  hypertrophy. Left ventricular diastolic   parameters are consistent with Grade II diastolic dysfunction  (pseudonormalization). Elevated left atrial pressure.   2. Right ventricular  systolic function is normal. The right ventricular  size is normal. There is mildly elevated pulmonary artery systolic  pressure. The estimated right ventricular systolic pressure is 76.5 mmHg.   3. Left atrial size was moderately dilated.   4. Right atrial size was mildly dilated.   5. A small pericardial effusion is present. The pericardial effusion is  circumferential. There is no evidence of cardiac tamponade.   6. The mitral valve is normal in structure. Mild mitral valve  regurgitation. Mild mitral stenosis. Severe mitral annular calcification.   7. The aortic valve is normal in structure. There is moderate  calcification of the aortic valve. There is moderate thickening of the  aortic valve. Aortic valve regurgitation is not visualized. Mild to  moderate aortic valve sclerosis/calcification is  present, without any evidence of aortic stenosis.   8. The inferior vena cava is dilated in size with <50% respiratory  variability, suggesting right atrial pressure of 15 mmHg.    Echocardiogram 02/02/2020:   1. Left ventricular ejection fraction, by estimation, is 65 to 70%. The  left ventricle has normal function. The left ventricle has no regional  wall motion abnormalities. There is severe left ventricular hypertrophy.  Left ventricular diastolic parameters   are consistent with Grade II diastolic dysfunction (pseudonormalization).  Elevated left atrial pressure.   2. Right ventricular systolic function is normal. The right ventricular  size is normal. There is mildly elevated pulmonary artery systolic  pressure. The estimated right ventricular systolic pressure is 46.5 mmHg.   3. A small pericardial effusion is present.   4. Left atrial size was severely dilated.   5. The mitral valve is abnormal. Severe mitral annular calcification.  Trivial mitral valve regurgitation. Moderate mitral stenosis. MG 8 mmHg at  HR 65 bpm, MVA 1.5 cm^2 by continuity equation   6. The aortic valve is  tricuspid. There is  moderate thickening of the  aortic valve. Aortic valve regurgitation is not visualized. Mild aortic  valve stenosis. Vmax 2.9 m/s, MG 39mHg, AVA 1.9 cm^2, DI 0.6   7. The inferior vena cava is dilated in size with <50% respiratory  variability, suggesting right atrial pressure of 15 mmHg.    Gated nuclear stress test 01/26/2020:    The left ventricular ejection fraction is hyperdynamic (>65%).  Nuclear stress EF: 67%.  There was no ST segment deviation noted during stress.  No T wave inversion was noted during stress.  The study is normal.  This is a low risk study.      Risk Assessment/Calculations:         ASSESSMENT:    1. Chronic diastolic heart failure ([facility] [facility]   2. Essential hypertension   3. CKD (chronic kidney disease), stage V ([place]   4. Hyperlipidemia, unspecified hyperlipidemia type      PLAN:  In order of problems listed above: Chronic diastolic CHF with recent hospitalization for such 2D echo 04/27/2020 normal LVEF with his severe LVH and grade 2 DD.  Sent home on torsemide 100 mg daily and metolazone 5 mg Tuesdays Thursdays Saturdays.  Also followed by Dr. KJohnney Ounephrology but now on hospice. She will call u[place]oreaif she needs anything otherwise declines further f/u.  Hypertension Imdur increased to 60 mg daily BP controlled  CKD stage V patient declined HD.  Renal has referred to hospice  Hyperlipidemia   Shared Decision Making/Informed Consent        Medication Adjustments/Labs and Tests Ordered: Current medicines are reviewed at length with the patient today.  Concerns regarding medicines are outlined above.  Medication changes, Labs and Tests ordered today are listed in the Patient Instructions below. Patient Instructions  Medication Instructions:  Your physician recommends that you continue on your current medications as directed. Please refer to the Current Medication list given to you today.  *If you need a refill on your  cardiac medications before your next appointment, please call your pharmacy*   Lab Work: None If you have labs (blood work) drawn today and your tests are completely normal, you will receive your results only by: .Marland KitchenMyChart Message (if you have MyChart) OR . A paper copy in the mail If you have any lab test that is abnormal or we need to change your treatment, we will call you to review the results.  Follow-Up: At [facility] you and your health needs are our priority.  As part of our continuing mission to provide you with exceptional heart care, we have created designated Provider Care Teams.  These Care Teams include your primary Cardiologist (physician) and Advanced Practice Providers (APPs -  Physician Assistants and Nurse Practitioners) who all work together to provide you with the care you need, when you need it.  Your next appointment:   Follow up as needed with Dr STamala Julian   Signed, MErmalinda Barrios PA-C  05/09/2020 7:57 AM    [place] [employer]  [id number]: [phone] Fax: [phone]

## 2020-05-02 NOTE — Discharge Summary (Addendum)
Physician Discharge Summary  Tiffany [name] QQV:[id number] DOB: 12-Jul-1950 DOA: 04/26/2020  PCP: Nolene Ebbs, MD  Admit date: 04/26/2020 Discharge date: 05/02/2020  Admitted From: Home Disposition:  Home  Recommendations for Outpatient Follow-up:  1. Follow up with PCP in 1 week 2. Follow up with nephrology as scheduled 4/4 3. Follow up with cardiology as scheduled 4/5 4. Follow-up with endocrinology as scheduled  Discharge Condition: Stable CODE STATUS: DNR Diet recommendation: Heart healthy/renal  Brief/Interim Summary: Tiffany [name] is a 70 year old female with past medical history significant for chronic diastolic heart failure, hypertension, OSA on CPAP, insulin-dependent type 2 diabetes, hypothyroidism, CKD stage V and has refused dialysis in the past, anemia of chronic disease, hyperlipidemia who presents to the hospital with worsening shortness of breath.  She states that for the past several weeks, she has been having worsening shortness of breath with exertion, chest tightness, has been sleeping with 4 pillows, with peripheral edema.  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  She was evaluated in the emergency department on 3/14, received one-time dose of IV Lasix, increased home torsemide dose and discharged home.  She was also hospitalized in December for heart failure exacerbation. Cardiology and Nephrology consulted to assist with diuresis.  Patient diuresed well with IV Lasix.  Kidney function remained stable.  Patient was discharged home on torsemide and metolazone 3 times weekly treatment with close follow-up with nephrology and cardiology.  Discharge Diagnoses:  Principal Problem:   Acute on chronic diastolic (congestive) heart failure (HCC) Active Problems:   Hypothyroidism   Obstructive sleep apnea   Essential hypertension   Chronic kidney disease, stage V (HCC)   Hyperlipidemia   Type 2 diabetes  mellitus with stage 5 chronic kidney disease (HCC)   Acute on chronic diastolic heart failure -BNP 322.2 -Repeat echocardiogram with EF 65 to 33%, grade 2 diastolic dysfunction -IV Lasix --> torsemide and metolazone 3 times weekly  -Strict I's and O's, daily weight -Follow-up with cardiology as outpatient  AKI on CKD stage V -Baseline creatinine around 3.5-4 -Patient was evaluated by nephrology in the past, patient has refused dialysis.  No urgent dialysis needed at this time -Follow-up with nephrology as outpatient -Creatinine remains stable for now  Demand ischemia -Troponin 99 --> 80 -Secondary to above  Hypothyroidism -TSH elevated.  Free T4 elevated -Continue Synthroid, decreased Synthroid dose  -Follows with endocrinology as an outpatient  Type 2 diabetes, insulin-dependent, well controlled -Hemoglobin A1c 6.3 -Continue Lantus, sliding scale insulin  Hypertension -Continue amlodipine, Coreg, clonidine, hydralazine, imdur, torsemide  Hyperlipidemia -Continue crestor, lovaza   OSA -CPAP nightly   Discharge Instructions  Discharge Instructions    (HEART FAILURE PATIENTS) Call MD:  Anytime you have any of the following symptoms: 1) 3 pound weight gain in 24 hours or 5 pounds in 1 week 2) shortness of breath, with or without a dry hacking cough 3) swelling in the hands, feet or stomach 4) if you have to sleep on extra pillows at night in order to breathe.   Complete by: As directed    Call MD for:  difficulty breathing, headache or visual disturbances   Complete by: As directed    Call MD for:  extreme fatigue   Complete by: As directed    Call MD for:  persistant dizziness or light-headedness   Complete by: As directed    Call MD for:  persistant nausea and vomiting  Complete by: As directed    Call MD for:  severe uncontrolled pain   Complete by: As directed    Call MD for:  temperature >100.4   Complete by: As directed    Discharge instructions    Complete by: As directed    You were cared for by a hospitalist during your hospital stay. If you have any questions about your discharge medications or the care you received while you were in the hospital after you are discharged, you can call the unit and ask to speak with the hospitalist on call if the hospitalist that took care of you is not available. Once you are discharged, your primary care physician will handle any further medical issues. Please note that NO REFILLS for any discharge medications will be authorized once you are discharged, as it is imperative that you return to your primary care physician (or establish a relationship with a primary care physician if you do not have one) for your aftercare needs so that they can reassess your need for medications and monitor your lab values.   Increase activity slowly   Complete by: As directed      Allergies as of 05/02/2020   No Known Allergies     Medication List    TAKE these medications   acetaminophen 650 MG CR tablet Commonly known as: TYLENOL Take 650 mg by mouth every 8 (eight) hours as needed for pain.   albuterol 108 (90 Base) MCG/ACT inhaler Commonly known as: VENTOLIN HFA Inhale 1-2 puffs into the lungs every 6 (six) hours as needed for wheezing or shortness of breath.   allopurinol 100 MG tablet Commonly known as: ZYLOPRIM Take 100 mg by mouth 2 (two) times daily.   amLODipine 10 MG tablet Commonly known as: NORVASC Take 10 mg by mouth 2 (two) times daily.   B COMPLEX PO Take 1 tablet by mouth daily.   B-D INS SYRINGE 0.5CC/30GX1/2" 30G X 1/2" 0.5 ML Misc Generic drug: Insulin Syringe-Needle U-100 TAKE AS DIRECTED TWICE A DAY   BIOTIN 5000 PO Take 5,000 mcg by mouth daily.   calcitRIOL 0.25 MCG capsule Commonly known as: ROCALTROL Take 0.5 mcg by mouth daily.   carvedilol 6.25 MG tablet Commonly known as: COREG Take 6.25 mg by mouth in the morning and at bedtime.   cetirizine 10 MG tablet Commonly  known as: ZYRTEC Take 10 mg by mouth daily as needed for allergies.   cloNIDine 0.3 mg/24hr patch Commonly known as: CATAPRES - Dosed in mg/24 hr Place 0.3 mg onto the skin every Friday.   Coenzyme Q10 100 MG capsule Take 100 mg by mouth daily.   Fish Oil 500 MG Caps Take 500 mg by mouth daily in the afternoon.   hydrALAZINE 50 MG tablet Commonly known as: APRESOLINE Take 1 tablet (50 mg total) by mouth 3 (three) times daily. What changed:   medication strength  how much to take   insulin glargine 100 UNIT/ML injection Commonly known as: LANTUS Inject 10 Units into the skin at bedtime.   isosorbide mononitrate 60 MG 24 hr tablet Commonly known as: IMDUR Take 1 tablet (60 mg total) by mouth daily. What changed:   medication strength  how much to take   levothyroxine 200 MCG tablet Commonly known as: SYNTHROID Take 1 tablet (200 mcg total) by mouth daily before breakfast. What changed:   medication strength  how much to take  when to take this  additional instructions   metolazone 5 MG  tablet Commonly known as: ZAROXOLYN Take 1 tablet (5 mg total) by mouth 3 (three) times a week. Tuesdays Thursdays and Saturdays   NON FORMULARY Take 1 tablet by mouth daily. Renadyl for kidneys   polyethylene glycol 17 g packet Commonly known as: MIRALAX / GLYCOLAX Take 17 g by mouth 2 (two) times daily. What changed:   when to take this  reasons to take this   PRESERVISION AREDS PO Take 1 tablet by mouth in the morning and at bedtime.   rosuvastatin 10 MG tablet Commonly known as: CRESTOR Take 10 mg by mouth every evening.   Systane Complete 0.6 % Soln Generic drug: Propylene Glycol Place 1 drop into both eyes in the morning, at noon, in the evening, and at bedtime.   torsemide 100 MG tablet Commonly known as: DEMADEX Take 1 tablet (100 mg total) by mouth daily. What changed: how much to take   TURMERIC PO Take 1 tablet by mouth daily.   zolpidem 5 MG  tablet Commonly known as: AMBIEN 1 at bedtime as needed for sleep What changed:   how much to take  how to take this  when to take this  reasons to take this       Follow-up Information    Nolene Ebbs, MD On 05/09/2020.   Specialty: Internal Medicine Why: @10 :30am Tele Visit Contact information: [place] [address] [phone]        Imogene Burn, PA-C Follow up.   Specialty: Cardiology Why: [facility] (Cardiology) - [facility] location - follow-up has been arranged for you on Tuesday May 09, 2020 7:45 AM (Arrive by 7:30 AM). Selinda Eon is one of the PAs that works with Dr. Daneen Schick. Contact information: [address] [place] [address] [phone]        Justin Mend, MD. Go on 05/08/2020.   Specialty: Internal Medicine Contact information: [place] [address] [phone]              No Known Allergies  Consultations:  Cardiology  Nephrology   Procedures/Studies: DG Chest 2 View  Result Date: 04/26/2020 CLINICAL DATA:  Shortness of breath EXAM: CHEST - 2 VIEW COMPARISON:  04/17/2020 FINDINGS: Cardiomegaly, vascular congestion. No overt edema. No confluent opacities or effusions. No acute bony abnormality. IMPRESSION: Cardiomegaly, vascular congestion. Electronically Signed   By: Rolm Baptise M.D.   On: 04/26/2020 19:54   DG Chest 2 View  Result Date: 04/17/2020 CLINICAL DATA:  Shortness of breath and congestion for a week. EXAM: CHEST - 2 VIEW COMPARISON:  02/01/2020. FINDINGS: Trachea is midline. Heart is enlarged. Atherosclerotic calcification of the aorta. Mild diffuse mixed interstitial prominence and indistinctness. No pleural fluid. IMPRESSION: Pulmonary edema. Electronically Signed   By: Lorin Picket M.D.   On: 04/17/2020 09:40   ECHOCARDIOGRAM COMPLETE  Result Date: 04/27/2020    ECHOCARDIOGRAM REPORT   Patient Name:   [facility] Date of Exam: 04/27/2020  Medical Rec #:  [id number]         Height:       70.0 in Accession #:    [id number]        Weight:       250.0 lb Date of Birth:  1950/10/22          BSA:          2.295 m Patient Age:    39 years          BP:  150/69 mmHg Patient Gender: F                 HR:           63 bpm. Exam Location:  Inpatient Procedure: 2D Echo, Cardiac Doppler and Color Doppler Indications:     CHF  History:         Patient has prior history of Echocardiogram examinations, most                  recent 02/02/2020. CHF, Signs/Symptoms:Shortness of Breath;                  Risk Factors:Diabetes, Hypertension and Dyslipidemia. CKD.  Sonographer:     Dustin Flock Referring Phys:  [id number] [place] [facility] Diagnosing Phys: Ena Dawley MD IMPRESSIONS  1. Left ventricular ejection fraction, by estimation, is 65 to 70%. The left ventricle has normal function. The left ventricle has no regional wall motion abnormalities. There is severe concentric left ventricular hypertrophy. Left ventricular diastolic  parameters are consistent with Grade II diastolic dysfunction (pseudonormalization). Elevated left atrial pressure.  2. Right ventricular systolic function is normal. The right ventricular size is normal. There is mildly elevated pulmonary artery systolic pressure. The estimated right ventricular systolic pressure is 83.1 mmHg.  3. Left atrial size was moderately dilated.  4. Right atrial size was mildly dilated.  5. A small pericardial effusion is present. The pericardial effusion is circumferential. There is no evidence of cardiac tamponade.  6. The mitral valve is normal in structure. Mild mitral valve regurgitation. Mild mitral stenosis. Severe mitral annular calcification.  7. The aortic valve is normal in structure. There is moderate calcification of the aortic valve. There is moderate thickening of the aortic valve. Aortic valve regurgitation is not visualized. Mild to moderate aortic valve sclerosis/calcification is present,  without any evidence of aortic stenosis.  8. The inferior vena cava is dilated in size with <50% respiratory variability, suggesting right atrial pressure of 15 mmHg. FINDINGS  Left Ventricle: Left ventricular ejection fraction, by estimation, is 65 to 70%. The left ventricle has normal function. The left ventricle has no regional wall motion abnormalities. The left ventricular internal cavity size was normal in size. There is  severe concentric left ventricular hypertrophy. Left ventricular diastolic parameters are consistent with Grade II diastolic dysfunction (pseudonormalization). Elevated left atrial pressure. Right Ventricle: The right ventricular size is normal. No increase in right ventricular wall thickness. Right ventricular systolic function is normal. There is mildly elevated pulmonary artery systolic pressure. The tricuspid regurgitant velocity is 2.57  m/s, and with an assumed right atrial pressure of 15 mmHg, the estimated right ventricular systolic pressure is 51.7 mmHg. Left Atrium: Left atrial size was moderately dilated. Right Atrium: Right atrial size was mildly dilated. Pericardium: A small pericardial effusion is present. The pericardial effusion is circumferential. There is no evidence of cardiac tamponade. Mitral Valve: The mitral valve is normal in structure. There is mild thickening of the mitral valve leaflet(s). There is mild calcification of the mitral valve leaflet(s). Severe mitral annular calcification. Mild mitral valve regurgitation. Mild mitral valve stenosis. MV peak gradient, 12.7 mmHg. The mean mitral valve gradient is 6.0 mmHg with average heart rate of 57 bpm. Tricuspid Valve: The tricuspid valve is normal in structure. Tricuspid valve regurgitation is mild . No evidence of tricuspid stenosis. Aortic Valve: The aortic valve is normal in structure. There is moderate calcification of the aortic valve. There is moderate thickening of the aortic valve.  Aortic valve regurgitation  is not visualized. Mild to moderate aortic valve sclerosis/calcification is present, without any evidence of aortic stenosis. Aortic valve mean gradient measures 13.0 mmHg. Aortic valve peak gradient measures 21.7 mmHg. Aortic valve area, by VTI measures 2.62 cm. Pulmonic Valve: The pulmonic valve was normal in structure. Pulmonic valve regurgitation is mild. No evidence of pulmonic stenosis. Aorta: The aortic root is normal in size and structure. Venous: The inferior vena cava is dilated in size with less than 50% respiratory variability, suggesting right atrial pressure of 15 mmHg. IAS/Shunts: No atrial level shunt detected by color flow Doppler.  LEFT VENTRICLE PLAX 2D LVIDd:         4.50 cm      Diastology LVIDs:         2.60 cm      LV e' medial:    6.31 cm/s LV PW:         1.80 cm      LV E/e' medial:  27.9 LV IVS:        1.90 cm      LV e' lateral:   6.20 cm/s LVOT diam:     2.20 cm      LV E/e' lateral: 28.4 LV SV:         147 LV SV Index:   64 LVOT Area:     3.80 cm  LV Volumes (MOD) LV vol d, MOD A4C: 113.0 ml LV vol s, MOD A4C: 37.7 ml LV SV MOD A4C:     113.0 ml RIGHT VENTRICLE RV Basal diam:  4.20 cm RV S prime:     17.30 cm/s TAPSE (M-mode): 3.0 cm LEFT ATRIUM              Index       RIGHT ATRIUM           Index LA diam:        4.80 cm  2.09 cm/m  RA Area:     26.90 cm LA Vol (A2C):   118.0 ml 51.42 ml/m RA Volume:   85.60 ml  37.30 ml/m LA Vol (A4C):   102.0 ml 44.45 ml/m LA Biplane Vol: 114.0 ml 49.67 ml/m  AORTIC VALVE AV Area (Vmax):    2.84 cm AV Area (Vmean):   2.74 cm AV Area (VTI):     2.62 cm AV Vmax:           233.00 cm/s AV Vmean:          168.000 cm/s AV VTI:            0.562 m AV Peak Grad:      21.7 mmHg AV Mean Grad:      13.0 mmHg LVOT Vmax:         174.00 cm/s LVOT Vmean:        121.000 cm/s LVOT VTI:          0.388 m LVOT/AV VTI ratio: 0.69  AORTA Ao Root diam: 3.30 cm MITRAL VALVE                TRICUSPID VALVE MV Area (PHT): 1.70 cm     TR Peak grad:   26.4 mmHg MV Area  VTI:   2.03 cm     TR Vmax:        257.00 cm/s MV Peak grad:  12.7 mmHg MV Mean grad:  6.0 mmHg     SHUNTS MV Vmax:       1.78 m/s  Systemic VTI:  0.39 m MV Vmean:      109.5 cm/s   Systemic Diam: 2.20 cm MV Decel Time: 447 msec MV E velocity: 176.00 cm/s MV A velocity: 166.00 cm/s MV E/A ratio:  1.06 Ena Dawley MD Electronically signed by Ena Dawley MD Signature Date/Time: 04/27/2020/12:37:22 PM    Final (Updated)        Discharge Exam: Vitals:   05/01/20 2032 05/02/20 0517  BP: (!) 141/76 (!) 165/76  Pulse: 63 61  Resp: 18 16  Temp: 97.7 F (36.5 C) 98.7 F (37.1 C)  SpO2: 98% 96%    General: Pt is alert, awake, not in acute distress Cardiovascular: RRR, S1/S2 +, trace edema Respiratory: CTA bilaterally, no wheezing, no rhonchi, no respiratory distress, no conversational dyspnea, on room air Abdominal: Soft, NT, ND, bowel sounds + Extremities: Trace edema, no cyanosis Psych: Normal mood and affect, stable judgement and insight     The results of significant diagnostics from this hospitalization (including imaging, microbiology, ancillary and laboratory) are listed below for reference.     Microbiology: Recent Results (from the past 240 hour(s))  Resp Panel by RT-PCR (Flu A&B, Covid) Nasopharyngeal Swab     Status: None   Collection Time: 04/26/20  8:42 PM   Specimen: Nasopharyngeal Swab; Nasopharyngeal(NP) swabs in vial transport medium  Result Value Ref Range Status   SARS Coronavirus 2 by RT PCR NEGATIVE NEGATIVE Final    Comment: (NOTE) SARS-CoV-2 target nucleic acids are NOT DETECTED.  The SARS-CoV-2 RNA is generally detectable in upper respiratory specimens during the acute phase of infection. The lowest concentration of SARS-CoV-2 viral copies this assay can detect is 138 copies/mL. A negative result does not preclude SARS-Cov-2 infection and should not be used as the sole basis for treatment or other patient management decisions. A negative result may  occur with  improper specimen collection/handling, submission of specimen other than nasopharyngeal swab, presence of viral mutation(s) within the areas targeted by this assay, and inadequate number of viral copies(<138 copies/mL). A negative result must be combined with clinical observations, patient history, and epidemiological information. The expected result is Negative.  Fact Sheet for Patients:  EntrepreneurPulse.com.au  Fact Sheet for Healthcare Providers:  IncredibleEmployment.be  This test is no t yet approved or cleared by the [place] FDA and  has been authorized for detection and/or diagnosis of SARS-CoV-2 by FDA under an Emergency Use Authorization (EUA). This EUA will remain  in effect (meaning this test can be used) for the duration of the COVID-19 declaration under Section 564(b)(1) of the Act, 21 U.S.C.section 360bbb-3(b)(1), unless the authorization is terminated  or revoked sooner.       Influenza A by PCR NEGATIVE NEGATIVE Final   Influenza B by PCR NEGATIVE NEGATIVE Final    Comment: (NOTE) The Xpert Xpress SARS-CoV-2/FLU/RSV plus assay is intended as an aid in the diagnosis of influenza from Nasopharyngeal swab specimens and should not be used as a sole basis for treatment. Nasal washings and aspirates are unacceptable for Xpert Xpress SARS-CoV-2/FLU/RSV testing.  Fact Sheet for Patients: EntrepreneurPulse.com.au  Fact Sheet for Healthcare Providers: IncredibleEmployment.be  This test is not yet approved or cleared by the [place] FDA and has been authorized for detection and/or diagnosis of SARS-CoV-2 by FDA under an Emergency Use Authorization (EUA). This EUA will remain in effect (meaning this test can be used) for the duration of the COVID-19 declaration under Section 564(b)(1) of the Act, 21 U.S.C. section 360bbb-3(b)(1), unless the authorization is  terminated  or revoked.  Performed at [facility], [place] [address] [place], [place] [address]      Labs: BNP (last 3 results) Recent Labs    02/01/20 1231 04/17/20 0907 04/26/20 1746  BNP 295.3* 508.0* 993.7*   Basic Metabolic Panel: Recent Labs  Lab 04/28/20 0414 04/29/20 0228 04/30/20 0416 05/01/20 0336 05/02/20 0421  NA 140 138 139 138 138  K 4.0 3.8 3.7 4.0 3.9  CL 106 105 105 106 104  CO2 23 24 23 24 24   GLUCOSE 113* 162* 125* 104* 132*  BUN 125* 121* 120* 115* 110*  CREATININE 4.22* 4.25* 4.21* 4.05* 3.91*  CALCIUM 9.0 9.2 9.4 9.6 9.2   Liver Function Tests: Recent Labs  Lab 04/26/20 1746  AST 27  ALT 29  ALKPHOS 112  BILITOT 0.5  PROT 6.4*  ALBUMIN 3.5   No results for input(s): LIPASE, AMYLASE in the last 168 hours. No results for input(s): AMMONIA in the last 168 hours. CBC: Recent Labs  Lab 04/26/20 0950 04/26/20 1724 04/27/20 0456  WBC  --  4.0 4.2  HGB 10.2* 9.5* 9.8*  HCT  --  30.6* 31.5*  MCV  --  86.0 85.4  PLT  --  111* 111*   Cardiac Enzymes: No results for input(s): CKTOTAL, CKMB, CKMBINDEX, TROPONINI in the last 168 hours. BNP: Invalid input(s): POCBNP CBG: Recent Labs  Lab 05/01/20 0603 05/01/20 1228 05/01/20 1628 05/01/20 2035 05/02/20 0621  GLUCAP 89 175* 139* 224* 125*   D-Dimer No results for input(s): DDIMER in the last 72 hours. Hgb A1c No results for input(s): HGBA1C in the last 72 hours. Lipid Profile No results for input(s): CHOL, HDL, LDLCALC, TRIG, CHOLHDL, LDLDIRECT in the last 72 hours. Thyroid function studies No results for input(s): TSH, T4TOTAL, T3FREE, THYROIDAB in the last 72 hours.  Invalid input(s): FREET3 Anemia work up No results for input(s): VITAMINB12, FOLATE, FERRITIN, TIBC, IRON, RETICCTPCT in the last 72 hours. Urinalysis    Component Value Date/Time   COLORURINE YELLOW 04/17/2020 1222   APPEARANCEUR CLEAR 04/17/2020 1222   LABSPEC 1.013 04/17/2020 1222   PHURINE 5.0 04/17/2020 1222    GLUCOSEU NEGATIVE 04/17/2020 1222   HGBUR NEGATIVE 04/17/2020 1222   BILIRUBINUR NEGATIVE 04/17/2020 1222   KETONESUR NEGATIVE 04/17/2020 1222   PROTEINUR 100 (A) 04/17/2020 1222   UROBILINOGEN 0.2 02/25/2019 1939   NITRITE NEGATIVE 04/17/2020 1222   LEUKOCYTESUR NEGATIVE 04/17/2020 1222   Sepsis Labs Invalid input(s): PROCALCITONIN,  WBC,  LACTICIDVEN Microbiology Recent Results (from the past 240 hour(s))  Resp Panel by RT-PCR (Flu A&B, Covid) Nasopharyngeal Swab     Status: None   Collection Time: 04/26/20  8:42 PM   Specimen: Nasopharyngeal Swab; Nasopharyngeal(NP) swabs in vial transport medium  Result Value Ref Range Status   SARS Coronavirus 2 by RT PCR NEGATIVE NEGATIVE Final    Comment: (NOTE) SARS-CoV-2 target nucleic acids are NOT DETECTED.  The SARS-CoV-2 RNA is generally detectable in upper respiratory specimens during the acute phase of infection. The lowest concentration of SARS-CoV-2 viral copies this assay can detect is 138 copies/mL. A negative result does not preclude SARS-Cov-2 infection and should not be used as the sole basis for treatment or other patient management decisions. A negative result may occur with  improper specimen collection/handling, submission of specimen other than nasopharyngeal swab, presence of viral mutation(s) within the areas targeted by this assay, and inadequate number of viral copies(<138 copies/mL). A negative result must be combined with clinical  observations, patient history, and epidemiological information. The expected result is Negative.  Fact Sheet for Patients:  EntrepreneurPulse.com.au  Fact Sheet for Healthcare Providers:  IncredibleEmployment.be  This test is no t yet approved or cleared by the [place] FDA and  has been authorized for detection and/or diagnosis of SARS-CoV-2 by FDA under an Emergency Use Authorization (EUA). This EUA will remain  in effect (meaning this  test can be used) for the duration of the COVID-19 declaration under Section 564(b)(1) of the Act, 21 U.S.C.section 360bbb-3(b)(1), unless the authorization is terminated  or revoked sooner.       Influenza A by PCR NEGATIVE NEGATIVE Final   Influenza B by PCR NEGATIVE NEGATIVE Final    Comment: (NOTE) The Xpert Xpress SARS-CoV-2/FLU/RSV plus assay is intended as an aid in the diagnosis of influenza from Nasopharyngeal swab specimens and should not be used as a sole basis for treatment. Nasal washings and aspirates are unacceptable for Xpert Xpress SARS-CoV-2/FLU/RSV testing.  Fact Sheet for Patients: EntrepreneurPulse.com.au  Fact Sheet for Healthcare Providers: IncredibleEmployment.be  This test is not yet approved or cleared by the [place] FDA and has been authorized for detection and/or diagnosis of SARS-CoV-2 by FDA under an Emergency Use Authorization (EUA). This EUA will remain in effect (meaning this test can be used) for the duration of the COVID-19 declaration under Section 564(b)(1) of the Act, 21 U.S.C. section 360bbb-3(b)(1), unless the authorization is terminated or revoked.  Performed at [facility], [place] [address] [place], [place] [address]      Patient was seen and examined on the day of discharge and was found to be in stable condition. Time coordinating discharge: 35 minutes including assessment and coordination of care, as well as examination of the patient.   SIGNED:  Dessa Phi, DO [facility] 05/02/2020, 11:39 AM

## 2020-05-09 ENCOUNTER — Ambulatory Visit: Payer: Medicare Other | Admitting: Physician Assistant

## 2020-05-09 ENCOUNTER — Encounter: Payer: Self-pay | Admitting: Physician Assistant

## 2020-05-09 ENCOUNTER — Other Ambulatory Visit: Payer: Self-pay

## 2020-05-09 VITALS — BP 130/62 | HR 68 | Ht 69.5 in | Wt 238.0 lb

## 2020-05-09 DIAGNOSIS — E785 Hyperlipidemia, unspecified: Secondary | ICD-10-CM

## 2020-05-09 DIAGNOSIS — I10 Essential (primary) hypertension: Secondary | ICD-10-CM

## 2020-05-09 DIAGNOSIS — N185 Chronic kidney disease, stage 5: Secondary | ICD-10-CM | POA: Diagnosis not present

## 2020-05-09 DIAGNOSIS — I5032 Chronic diastolic (congestive) heart failure: Secondary | ICD-10-CM

## 2020-05-09 NOTE — Patient Instructions (Signed)
Medication Instructions:  Your physician recommends that you continue on your current medications as directed. Please refer to the Current Medication list given to you today.  *If you need a refill on your cardiac medications before your next appointment, please call your pharmacy*   Lab Work: None If you have labs (blood work) drawn today and your tests are completely normal, you will receive your results only by: Marland Kitchen MyChart Message (if you have MyChart) OR . A paper copy in the mail If you have any lab test that is abnormal or we need to change your treatment, we will call you to review the results.  Follow-Up: At [facility], you and your health needs are our priority.  As part of our continuing mission to provide you with exceptional heart care, we have created designated Provider Care Teams.  These Care Teams include your primary Cardiologist (physician) and Advanced Practice Providers (APPs -  Physician Assistants and Nurse Practitioners) who all work together to provide you with the care you need, when you need it.  Your next appointment:   Follow up as needed with Dr Tamala Julian

## 2020-05-10 ENCOUNTER — Telehealth: Payer: Self-pay

## 2020-05-10 ENCOUNTER — Encounter (HOSPITAL_COMMUNITY): Payer: Medicare Other

## 2020-05-10 NOTE — Telephone Encounter (Signed)
Spoke with patient and scheduled an in-person Palliative Consult for 05/22/20 @ 12:30PM  COVID screening was negative. No pets in home. Patient lives alone.  Consent obtained; updated Outlook/Netsmart/Team List and Epic.  Family is aware they may be receiving a call from NP the day before or day of to confirm appointment.

## 2020-05-10 NOTE — Telephone Encounter (Signed)
Attempted to contact patient to schedule a Palliative Care consult appointment. No answer left a message to return call.  

## 2020-05-22 ENCOUNTER — Other Ambulatory Visit: Payer: Self-pay

## 2020-05-22 ENCOUNTER — Other Ambulatory Visit: Payer: Medicare Other | Admitting: Hospice

## 2020-05-22 DIAGNOSIS — G4733 Obstructive sleep apnea (adult) (pediatric): Secondary | ICD-10-CM

## 2020-05-22 DIAGNOSIS — E1122 Type 2 diabetes mellitus with diabetic chronic kidney disease: Secondary | ICD-10-CM

## 2020-05-22 DIAGNOSIS — Z515 Encounter for palliative care: Secondary | ICD-10-CM

## 2020-05-22 DIAGNOSIS — N185 Chronic kidney disease, stage 5: Secondary | ICD-10-CM

## 2020-05-22 NOTE — Progress Notes (Signed)
[facility] Consult Note Telephone: [phone]  Fax: [phone]  PATIENT NAME: Tiffany [name] [address] [place] [address] [phone] (home)  DOB: 06/09/1950 MRN: [id number]  PRIMARY CARE PROVIDER:    Nolene Ebbs, MD,  [place] [address] [phone]  REFERRING PROVIDER:   Dr. Jannifer Hick  RESPONSIBLE PARTY:    Contact Information    Name Relation Home Work [place] Brother [phone]  [phone]   Jethro Bastos Sister [phone]  [phone]     I met face to face with patient and family at home. Palliative Care was asked to follow this patient by consultation request of Dr. Jannifer Hick to address advance care planning and complex medical decision making. This is the initial visit.    ASSESSMENT AND / RECOMMENDATIONS:   Advance Care Planning: Our advance care planning conversation included a discussion about:     The value and importance of advance care planning   Difference between Hospice and Palliative care  Exploration of goals of care in the event of a sudden injury or illness   Identification and preparation of a healthcare agent   Review and updating or creation of an  advance directive document .  Decision not to resuscitate or to de-escalate disease focused treatments due to poor prognosis.  CODE STATUS: Discussion on implications of code status. Patient affirmed she is a DNR. NP signed DNR form for patient to keep at home; same document uploaded to Epic today  Goals of Care: Goals include to maximize quality of life and symptom management. Patient does not want dialysis. She is interested in Hospice service in the future. She said she does not want to suffer in any way. Further discussion on goals of care clarification using MOST for for next visit.  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  I spent 46  minutes providing this initial consultation. More than 50% of the time in this consultation was spent on counseling patient and coordinating communication. --------------------------------------------------------------------------------------------------------------------------------------  Symptom Management/Plan: Stage IV CKD: Patient refuses dialysis. Patient currently on Torsemide and Metalozone. Follow up with Nephrologist as planned OSA: Continue CPAP at bedtime Type 2 DM: Last A1c 04/26/20 is 6.3. Continue Insulin as ordered.  Follow up: Palliative care will continue to follow for complex medical decision making, advance care planning, and clarification of goals. Return 6 weeks or prn.Encouraged to call provider sooner with any concerns.   PPS: 60%  HOSPICE ELIGIBILITY/DIAGNOSIS: TBD  Chief Complaint: CKD stage 5  HISTORY OF PRESENT ILLNESS:  [name] is a 70 y.o. year old female  with multiple medical conditions including chronic kidney disease ongoing for about ten years, progressed over the years and recently worsened to stage 5 with associated symptoms of nausea, vomiting; these make her feel miserable and reduce her independence and quality of life. Zofran is helpful with nausea/vomiting. She said she is avoiding processed foods and added salt which worsens her condition; avoidance has helped her lose some weight and helping with her Kidney disease. Discussion on disease trajectory and edema which would occur with disease progression. She is determined not to have dialysis. Patient reports she is able to urinate as before. History of Hypothyroidism, Obstructive Sleep Apnea, Hypertensive, CHF, Type 2 DM. History obtained from review of EMR, discussion with primary team, caregiver, family and/or Ms. Badger.  Review and summarization of Epic records shows history from  other than patient. Rest of 10 point ROS asked and negative.  Palliative Care was asked  to follow this patient by consultation request of Dr. Jannifer Hick to help address complex decision making in the context of advance care planning and goals of care clarification.    Review of lab tests/diagnostics   Results for OAKLIE, DURRETT ANN (MRN [id number]) as of 05/22/2020 13:36  Ref. Range 05/02/2020 16:60  BASIC METABOLIC PANEL Unknown Rpt (A)  Sodium Latest Ref Range: 135 - 145 mmol/L 138  Potassium Latest Ref Range: 3.5 - 5.1 mmol/L 3.9  Chloride Latest Ref Range: 98 - 111 mmol/L 104  CO2 Latest Ref Range: 22 - 32 mmol/L 24  Glucose Latest Ref Range: 70 - 99 mg/dL 132 (H)  BUN Latest Ref Range: 8 - 23 mg/dL 110 (H)  Creatinine Latest Ref Range: 0.44 - 1.00 mg/dL 3.91 (H)  Calcium Latest Ref Range: 8.9 - 10.3 mg/dL 9.2  Anion gap Latest Ref Range: 5 - 15  10  GFR, Estimated Latest Ref Range: >60 mL/min 12 (L)  Results for DEAMBER, BUCKHALTER ANN (MRN [id number]) as of 05/22/2020 13:36  Ref. Range 04/26/2020 21:50  Hemoglobin A1C Latest Ref Range: 4.8 - 5.6 % 6.3 (H)   ROS General: NAD EYES: denies vision changes ENMT: denies dysphagia Cardiovascular: denies chest pain/discomfort Pulmonary: denies cough, denies SOB Abdomen: endorses good appetite, denies constipation, endorses continence of bowel and bowel GU: denies dysuria, urinary frequency MSK:  denies weakness,  no falls reported Skin: denies rashes or wounds Neurological: denies pain, denies insomnia Psych: Endorses positive mood Heme/lymph/immuno: denies bruises, abnormal bleeding  Physical Exam: Height/Weight 5 feet 9 inches 210 Ibs down from 270 Ibs 3 months ago.  Constitutional: NAD General: Well groomed EYES: anicteric sclera, lids intact, no discharge  ENMT: Moist mucous membrane CV: S1 S2, no LE edema Pulmonary: LCTA, no increased work of breathing, no cough, Abdomen: active BS + 4 quadrants, soft and non tender, no ascites GU: no suprapubic tenderness MSK: weakness, ambulatory without device Skin: warm and  dry, no rashes or wounds on visible skin, hair loss to BLE, cool to touch Neuro:  weakness, otherwise non focal Psych: non-anxious affect Hem/lymph/immuno: no widespread bruising   PAST MEDICAL HISTORY:  Active Ambulatory Problems    Diagnosis Date Noted  . Hypothyroidism 02/12/2007  . OBESITY 01/08/2007  . Obstructive sleep apnea 01/08/2007  . Seasonal and perennial allergic rhinitis 01/08/2007  . HEART MURMUR, SYSTOLIC 32/35/5732  . Chronic diastolic heart failure ([place]) 05/05/2013  . Essential hypertension 05/05/2013  . Chronic kidney disease, stage V ([place]) 04/28/2014  . Tachycardia 08/26/2016  . Palpitation 08/26/2016  . SOB (shortness of breath) 08/26/2016  . Hyperlipidemia 09/12/2016  . Type 2 diabetes mellitus without complications ([place]) 20/25/4270  . S/P left TKA 07/01/2019  . Status post total left knee replacement 07/01/2019  . Acute CHF (congestive heart failure) ([place]) 02/01/2020  . Acute on chronic diastolic (congestive) heart failure ([place]) 04/26/2020  . Type 2 diabetes mellitus with stage 5 chronic kidney disease ([place]) 04/26/2020   Resolved Ambulatory Problems    Diagnosis Date Noted  . No Resolved Ambulatory Problems   Past Medical History:  Diagnosis Date  . ALLERGIC RHINITIS   . Arthritis   . CHF (congestive heart failure) ([place])   . CKD (chronic kidney disease)   . Diabetes mellitus   . Dyspnea   . Heart murmur   . Hypertension   . Hypothyroid   . Iron deficiency anemia   .  Obesity   . OSA on CPAP   . Previous back surgery   . Refusal of blood transfusions as patient is Jehovah's Witness   . S/P arthroscopy     SOCIAL HX:  Social History   Tobacco Use  . Smoking status: Never Smoker  . Smokeless tobacco: Never Used  Substance Use Topics  . Alcohol use: Yes    Alcohol/week: 0.0 standard drinks    Comment: rare    Family /Caregiver/Community Supports: Patient lives at home. Her son and girlfriend come daily to help her run errands.    FAMILY HX:  Family History  Problem Relation Age of Onset  . Prostate cancer Father   . Congestive Heart Failure Mother   . Diabetes Sister   . Other Sister        septis  . Breast cancer Neg Hx       ALLERGIES: No Known Allergies    PERTINENT MEDICATIONS:  Outpatient Encounter Medications as of 05/22/2020  Medication Sig  . acetaminophen (TYLENOL) 650 MG CR tablet Take 650 mg by mouth every 8 (eight) hours as needed for pain.  Marland Kitchen albuterol (VENTOLIN HFA) 108 (90 Base) MCG/ACT inhaler Inhale 1-2 puffs into the lungs every 6 (six) hours as needed for wheezing or shortness of breath.  . allopurinol (ZYLOPRIM) 100 MG tablet Take 100 mg by mouth 2 (two) times daily.  Marland Kitchen amLODipine (NORVASC) 10 MG tablet Take 10 mg by mouth 2 (two) times daily.   . B Complex Vitamins (B COMPLEX PO) Take 1 tablet by mouth daily.  . B-D INS SYRINGE 0.5CC/30GX1/2" 30G X 1/2" 0.5 ML MISC TAKE AS DIRECTED TWICE A DAY  . BIOTIN 5000 PO Take 5,000 mcg by mouth daily.   . calcitRIOL (ROCALTROL) 0.25 MCG capsule Take 0.5 mcg by mouth daily.  . carvedilol (COREG) 6.25 MG tablet Take 6.25 mg by mouth in the morning and at bedtime.  . cetirizine (ZYRTEC) 10 MG tablet Take 10 mg by mouth daily as needed for allergies.  . cloNIDine (CATAPRES - DOSED IN MG/24 HR) 0.3 mg/24hr Place 0.3 mg onto the skin every Friday.   . Coenzyme Q10 100 MG capsule Take 100 mg by mouth daily.  . hydrALAZINE (APRESOLINE) 50 MG tablet Take 1 tablet (50 mg total) by mouth 3 (three) times daily.  . insulin glargine (LANTUS) 100 UNIT/ML injection Inject 10 Units into the skin at bedtime.  . isosorbide mononitrate (IMDUR) 60 MG 24 hr tablet Take 1 tablet (60 mg total) by mouth daily.  Marland Kitchen levothyroxine (SYNTHROID) 200 MCG tablet Take 1 tablet (200 mcg total) by mouth daily before breakfast.  . metolazone (ZAROXOLYN) 5 MG tablet Take 1 tablet (5 mg total) by mouth 3 (three) times a week. Tuesdays Thursdays and Saturdays  . Multiple  Vitamins-Minerals (PRESERVISION AREDS PO) Take 1 tablet by mouth in the morning and at bedtime.  . NON FORMULARY Take 1 tablet by mouth daily. Renadyl for kidneys  . Omega-3 Fatty Acids (FISH OIL) 500 MG CAPS Take 500 mg by mouth daily in the afternoon.  . polyethylene glycol (MIRALAX / GLYCOLAX) 17 g packet Take 17 g by mouth 2 (two) times daily. (Patient taking differently: Take 17 g by mouth daily as needed for mild constipation.)  . rosuvastatin (CRESTOR) 10 MG tablet Take 10 mg by mouth every evening.  Marland Kitchen SYSTANE COMPLETE 0.6 % SOLN Place 1 drop into both eyes in the morning, at noon, in the evening, and at bedtime.  . torsemide (  DEMADEX) 100 MG tablet Take 1 tablet (100 mg total) by mouth daily.  . TURMERIC PO Take 1 tablet by mouth daily.  Marland Kitchen zolpidem (AMBIEN) 5 MG tablet 1 at bedtime as needed for sleep (Patient taking differently: Take 5 mg by mouth at bedtime as needed for sleep. 1 at bedtime as needed for sleep)   No facility-administered encounter medications on file as of 05/22/2020.   Thank you for the opportunity to participate in the care of Ms. Duey.  The palliative care team will continue to follow. Please call our office at [phone] if we can be of additional assistance.   Note: Portions of this note were generated with Lobbyist. Dictation errors may occur despite best attempts at proofreading.  Teodoro Spray, NP

## 2020-05-23 ENCOUNTER — Other Ambulatory Visit: Payer: Self-pay | Admitting: Internal Medicine

## 2020-05-24 NOTE — Telephone Encounter (Signed)
Ambien refilled

## 2020-06-04 ENCOUNTER — Emergency Department (HOSPITAL_COMMUNITY): Payer: Medicare Other

## 2020-06-04 ENCOUNTER — Inpatient Hospital Stay (HOSPITAL_COMMUNITY)
Admission: EM | Admit: 2020-06-04 | Discharge: 2020-06-07 | DRG: 291 | Disposition: A | Discharge status: Hospice | Payer: Medicare Other | Attending: Internal Medicine | Admitting: Internal Medicine

## 2020-06-04 ENCOUNTER — Encounter (HOSPITAL_COMMUNITY): Payer: Self-pay | Admitting: Emergency Medicine

## 2020-06-04 DIAGNOSIS — Z79899 Other long term (current) drug therapy: Secondary | ICD-10-CM

## 2020-06-04 DIAGNOSIS — I5032 Chronic diastolic (congestive) heart failure: Secondary | ICD-10-CM | POA: Diagnosis present

## 2020-06-04 DIAGNOSIS — N186 End stage renal disease: Secondary | ICD-10-CM | POA: Diagnosis not present

## 2020-06-04 DIAGNOSIS — E1122 Type 2 diabetes mellitus with diabetic chronic kidney disease: Secondary | ICD-10-CM | POA: Diagnosis present

## 2020-06-04 DIAGNOSIS — K5900 Constipation, unspecified: Secondary | ICD-10-CM | POA: Diagnosis not present

## 2020-06-04 DIAGNOSIS — N179 Acute kidney failure, unspecified: Secondary | ICD-10-CM | POA: Diagnosis present

## 2020-06-04 DIAGNOSIS — Z20822 Contact with and (suspected) exposure to covid-19: Secondary | ICD-10-CM | POA: Diagnosis present

## 2020-06-04 DIAGNOSIS — J309 Allergic rhinitis, unspecified: Secondary | ICD-10-CM | POA: Diagnosis present

## 2020-06-04 DIAGNOSIS — Z515 Encounter for palliative care: Secondary | ICD-10-CM

## 2020-06-04 DIAGNOSIS — E039 Hypothyroidism, unspecified: Secondary | ICD-10-CM | POA: Diagnosis present

## 2020-06-04 DIAGNOSIS — Z79890 Hormone replacement therapy (postmenopausal): Secondary | ICD-10-CM

## 2020-06-04 DIAGNOSIS — G4733 Obstructive sleep apnea (adult) (pediatric): Secondary | ICD-10-CM | POA: Diagnosis present

## 2020-06-04 DIAGNOSIS — Z8249 Family history of ischemic heart disease and other diseases of the circulatory system: Secondary | ICD-10-CM

## 2020-06-04 DIAGNOSIS — Z794 Long term (current) use of insulin: Secondary | ICD-10-CM

## 2020-06-04 DIAGNOSIS — N189 Chronic kidney disease, unspecified: Secondary | ICD-10-CM

## 2020-06-04 DIAGNOSIS — Z833 Family history of diabetes mellitus: Secondary | ICD-10-CM

## 2020-06-04 DIAGNOSIS — Z66 Do not resuscitate: Secondary | ICD-10-CM | POA: Diagnosis present

## 2020-06-04 DIAGNOSIS — I132 Hypertensive heart and chronic kidney disease with heart failure and with stage 5 chronic kidney disease, or end stage renal disease: Principal | ICD-10-CM | POA: Diagnosis present

## 2020-06-04 DIAGNOSIS — D638 Anemia in other chronic diseases classified elsewhere: Secondary | ICD-10-CM | POA: Diagnosis present

## 2020-06-04 DIAGNOSIS — Z96652 Presence of left artificial knee joint: Secondary | ICD-10-CM | POA: Diagnosis present

## 2020-06-04 DIAGNOSIS — Z90710 Acquired absence of both cervix and uterus: Secondary | ICD-10-CM

## 2020-06-04 DIAGNOSIS — E785 Hyperlipidemia, unspecified: Secondary | ICD-10-CM | POA: Diagnosis present

## 2020-06-04 LAB — CBC
HCT: 30.4 % — ABNORMAL LOW (ref 36.0–46.0)
Hemoglobin: 9.5 g/dL — ABNORMAL LOW (ref 12.0–15.0)
MCH: 26.7 pg (ref 26.0–34.0)
MCHC: 31.3 g/dL (ref 30.0–36.0)
MCV: 85.4 fL (ref 80.0–100.0)
Platelets: 95 10*3/uL — ABNORMAL LOW (ref 150–400)
RBC: 3.56 MIL/uL — ABNORMAL LOW (ref 3.87–5.11)
RDW: 16.4 % — ABNORMAL HIGH (ref 11.5–15.5)
WBC: 5.5 10*3/uL (ref 4.0–10.5)
nRBC: 0 % (ref 0.0–0.2)

## 2020-06-04 LAB — COMPREHENSIVE METABOLIC PANEL
ALT: 12 U/L (ref 0–44)
AST: 17 U/L (ref 15–41)
Albumin: 3.6 g/dL (ref 3.5–5.0)
Alkaline Phosphatase: 80 U/L (ref 38–126)
Anion gap: 20 — ABNORMAL HIGH (ref 5–15)
BUN: 199 mg/dL — ABNORMAL HIGH (ref 8–23)
CO2: 22 mmol/L (ref 22–32)
Calcium: 9.4 mg/dL (ref 8.9–10.3)
Chloride: 95 mmol/L — ABNORMAL LOW (ref 98–111)
Creatinine, Ser: 9.36 mg/dL — ABNORMAL HIGH (ref 0.44–1.00)
GFR, Estimated: 4 mL/min — ABNORMAL LOW (ref >60)
Glucose, Bld: 135 mg/dL — ABNORMAL HIGH (ref 70–99)
Potassium: 3.4 mmol/L — ABNORMAL LOW (ref 3.5–5.1)
Sodium: 137 mmol/L (ref 135–145)
Total Bilirubin: 0.6 mg/dL (ref 0.3–1.2)
Total Protein: 7 g/dL (ref 6.5–8.1)

## 2020-06-04 LAB — I-STAT CHEM 8, ED
BUN: >130 mg/dL — ABNORMAL HIGH (ref 8–23)
Calcium, Ion: 1.07 mmol/L — ABNORMAL LOW (ref 1.15–1.40)
Chloride: 97 mmol/L — ABNORMAL LOW (ref 98–111)
Creatinine, Ser: 9.8 mg/dL — ABNORMAL HIGH (ref 0.44–1.00)
Glucose, Bld: 135 mg/dL — ABNORMAL HIGH (ref 70–99)
HCT: 31 % — ABNORMAL LOW (ref 36.0–46.0)
Hemoglobin: 10.5 g/dL — ABNORMAL LOW (ref 12.0–15.0)
Potassium: 3.5 mmol/L (ref 3.5–5.1)
Sodium: 137 mmol/L (ref 135–145)
TCO2: 26 mmol/L (ref 22–32)

## 2020-06-04 LAB — LIPASE, BLOOD: Lipase: 118 U/L — ABNORMAL HIGH (ref 11–51)

## 2020-06-04 MED ORDER — HYDROMORPHONE HCL 1 MG/ML IJ SOLN
0.5000 mg | INTRAMUSCULAR | Status: DC | PRN
  Administered 2020-06-04 – 2020-06-05 (×2): 1 mg via INTRAVENOUS
  Filled 2020-06-04 (×2): qty 1

## 2020-06-04 MED ORDER — BISACODYL 10 MG RE SUPP: 10.0000 mg | Freq: Every day | RECTAL | Status: DC | PRN

## 2020-06-04 MED ORDER — LORAZEPAM 1 MG PO TABS: 1.0000 mg | ORAL_TABLET | ORAL | Status: DC | PRN

## 2020-06-04 MED ORDER — HYDROMORPHONE HCL 1 MG/ML IJ SOLN
0.5000 mg | Freq: Once | INTRAMUSCULAR | Status: DC
  Filled 2020-06-04: qty 1

## 2020-06-04 MED ORDER — ACETAMINOPHEN 650 MG RE SUPP: 650.0000 mg | Freq: Four times a day (QID) | RECTAL | Status: DC | PRN

## 2020-06-04 MED ORDER — MORPHINE SULFATE (CONCENTRATE) 10 MG/0.5ML PO SOLN: 5.0000 mg | ORAL | Status: DC | PRN

## 2020-06-04 MED ORDER — TRAZODONE HCL 50 MG PO TABS
25.0000 mg | ORAL_TABLET | Freq: Every evening | ORAL | Status: DC | PRN
  Administered 2020-06-06 (×2): 25 mg via ORAL
  Filled 2020-06-04 (×2): qty 1

## 2020-06-04 MED ORDER — SODIUM CHLORIDE 0.9 % IV BOLUS: 1000.0000 mL | Freq: Once | INTRAVENOUS | Status: DC

## 2020-06-04 MED ORDER — SODIUM CHLORIDE 0.9 % IV BOLUS
500.0000 mL | Freq: Once | INTRAVENOUS | Status: AC
  Administered 2020-06-04: 500 mL via INTRAVENOUS

## 2020-06-04 MED ORDER — HYDROMORPHONE HCL 1 MG/ML IJ SOLN
0.5000 mg | Freq: Once | INTRAMUSCULAR | Status: AC
  Administered 2020-06-04: 0.5 mg via INTRAVENOUS
  Filled 2020-06-04: qty 1

## 2020-06-04 MED ORDER — ONDANSETRON HCL 4 MG/2ML IJ SOLN
4.0000 mg | Freq: Four times a day (QID) | INTRAMUSCULAR | Status: DC | PRN
  Filled 2020-06-04: qty 2

## 2020-06-04 MED ORDER — ACETAMINOPHEN 325 MG PO TABS: 650.0000 mg | ORAL_TABLET | Freq: Four times a day (QID) | ORAL | Status: DC | PRN

## 2020-06-04 MED ORDER — LORAZEPAM 2 MG/ML IJ SOLN
1.0000 mg | INTRAMUSCULAR | Status: DC | PRN
Start: 2020-06-04 — End: 2020-06-07
  Administered 2020-06-04: 1 mg via INTRAVENOUS
  Filled 2020-06-04: qty 1

## 2020-06-04 MED ORDER — LORAZEPAM 2 MG/ML PO CONC: 1.0000 mg | ORAL | Status: DC | PRN

## 2020-06-04 MED ORDER — FENTANYL CITRATE (PF) 100 MCG/2ML IJ SOLN
50.0000 ug | Freq: Once | INTRAMUSCULAR | Status: AC
  Administered 2020-06-04: 50 ug via INTRAVENOUS
  Filled 2020-06-04: qty 2

## 2020-06-04 MED ORDER — ONDANSETRON HCL 4 MG/2ML IJ SOLN
4.0000 mg | Freq: Once | INTRAMUSCULAR | Status: AC
  Administered 2020-06-04: 4 mg via INTRAVENOUS
  Filled 2020-06-04: qty 2

## 2020-06-04 MED ORDER — ONDANSETRON HCL 4 MG PO TABS: 4.0000 mg | ORAL_TABLET | Freq: Four times a day (QID) | ORAL | Status: DC | PRN

## 2020-06-04 MED ORDER — HYDROMORPHONE HCL 1 MG/ML IJ SOLN: 1.0000 mg | Freq: Once | INTRAMUSCULAR | Status: DC

## 2020-06-04 NOTE — ED Notes (Signed)
Patient transported to CT 

## 2020-06-04 NOTE — ED Notes (Signed)
Attempted to give report, floor to call this rn back.

## 2020-06-04 NOTE — ED Notes (Addendum)
After soap sud enema, pt unable to pass bowel movement, this rn provided removal of fecal impaction. Pt now reporting feeling more comfortable.

## 2020-06-04 NOTE — ED Notes (Signed)
[facility] notified for transport.

## 2020-06-04 NOTE — ED Triage Notes (Signed)
Pt from home via [facility] for constipation x1 week.  Took castor oil this morning without relief.

## 2020-06-04 NOTE — ED Triage Notes (Signed)
Emergency Medicine Provider Triage Evaluation Note  Tiffany [name] , a 70 y.o. female  was evaluated in triage.  Pt complains of lower abd pain, nausea, vomiting and constipation for the past week.   Review of Systems  Positive: Abd pain and vomiting Negative: No fever. No chest pain.   Physical Exam  There were no vitals taken for this visit. Gen:   Awake, no distress  HEENT:  Atraumatic Resp:  Normal effort  Cardiac:  Normal rate  Abd:   Nondistended, tenderness mid to lower abd. No peritonitis.  MSK:   Moves extremities without difficulty Neuro:  Speech clear  Medical Decision Making  Medically screening exam initiated at 1:02 PM.  Appropriate orders placed.  Tiffany [name] was informed that the remainder of the evaluation will be completed by another provider, this initial triage assessment does not replace that evaluation, and the importance of remaining in the ED until their evaluation is complete.  Clinical Impression  Labs and imaging ordered. Pt to move to treatment room as soon as available.    Tiffany Saver, MD 06/04/20 [phone]

## 2020-06-04 NOTE — ED Notes (Signed)
Pt only requesting sister at bedside at this time.

## 2020-06-04 NOTE — ED Provider Notes (Signed)
[facility] EMERGENCY DEPARTMENT Provider Note   CSN: [id number] Arrival date & time: 06/04/20  1251     History Chief Complaint  Patient presents with  . Constipation    Tiffany [name] is a 70 y.o. female.  HPI 70 year old female presents with a chief complaint of lower abdominal pain.  This has been present for about a week.  She has had constipation with minimal hard stools and some passing of gas.  She has also had vomiting and has not been able to take any fluids for the past 2 days.  Pain is mostly lower abdominal.  No chest pain or shortness of breath.  No leg swelling. Pain is severe.   Past Medical History:  Diagnosis Date  . ALLERGIC RHINITIS   . Arthritis   . CHF (congestive heart failure) ([place])   . Chronic diastolic heart failure ([place]) 05/05/2013   Echo 03/2018:  EF 86-16, +diastolic dysfunction, GLS -20.5%, normal RVSF, RVSP 42.5 (mild elevated), severe LAE, small pericardial eff, mild MAc, mod AV calc, mild PI  . CKD (chronic kidney disease)   . Diabetes mellitus   . Dyspnea   . Heart murmur    at birth  . Hyperlipidemia   . Hypertension   . Hypothyroid   . Iron deficiency anemia   . Obesity   . OSA on CPAP   . Previous back surgery    x 2  . Refusal of blood transfusions as patient is Jehovah's Witness   . S/P arthroscopy     Patient Active Problem List   Diagnosis Date Noted  . ESRD (end stage renal disease) ([place]) 06/04/2020  . Acute on chronic diastolic (congestive) heart failure ([place]) 04/26/2020  . Type 2 diabetes mellitus with stage 5 chronic kidney disease ([place]) 04/26/2020  . Acute CHF (congestive heart failure) ([place]) 02/01/2020  . S/P left TKA 07/01/2019  . Status post total left knee replacement 07/01/2019  . Hyperlipidemia 09/12/2016  . Type 2 diabetes mellitus without complications ([place]) 83/72/9021  . Tachycardia 08/26/2016  . Palpitation 08/26/2016  . SOB (shortness of breath) 08/26/2016  . Chronic kidney disease, stage  V ([place]) 04/28/2014  . Chronic diastolic heart failure ([place]) 05/05/2013  . Essential hypertension 05/05/2013  . HEART MURMUR, SYSTOLIC 11/55/2080  . Hypothyroidism 02/12/2007  . OBESITY 01/08/2007  . Obstructive sleep apnea 01/08/2007  . Seasonal and perennial allergic rhinitis 01/08/2007    Past Surgical History:  Procedure Laterality Date  . BREAST BIOPSY  20+ yrs ago   dont know which breast per pt; benign  . BREAST REDUCTION SURGERY  1982  . KNEE SURGERY     x 2  . LUMBAR DISC SURGERY     x 2  . REDUCTION MAMMAPLASTY Bilateral 1985  . TOE SURGERY    . TOTAL ABDOMINAL HYSTERECTOMY  1997   partial hysterectomy- still has ovaries  . TOTAL KNEE ARTHROPLASTY Left 07/01/2019   Procedure: TOTAL KNEE ARTHROPLASTY;  Surgeon: Paralee Cancel, MD;  Location: [facility] ORS;  Service: Orthopedics;  Laterality: Left;  70 min NO BLOOD PRODUCTS!!  . TREATMENT FISTULA ANAL       OB History   No obstetric history on file.     Family History  Problem Relation Age of Onset  . Prostate cancer Father   . Congestive Heart Failure Mother   . Diabetes Sister   . Other Sister        septis  . Breast cancer Neg Hx  Social History   Tobacco Use  . Smoking status: Never Smoker  . Smokeless tobacco: Never Used  Vaping Use  . Vaping Use: Never used  Substance Use Topics  . Alcohol use: Yes    Alcohol/week: 0.0 standard drinks    Comment: rare  . Drug use: No    Home Medications Prior to Admission medications   Medication Sig Start Date End Date Taking? Authorizing Provider  allopurinol (ZYLOPRIM) 100 MG tablet Take 100 mg by mouth 2 (two) times daily. 07/30/16  Yes [provider]  acetaminophen (TYLENOL) 650 MG CR tablet Take 650 mg by mouth every 8 (eight) hours as needed for pain.    [provider]  albuterol (PROAIR HFA) 108 (90 Base) MCG/ACT inhaler Inhale 2 puffs into the lungs every 6 (six) hours as needed for wheezing or shortness of breath.    [provider]  albuterol (VENTOLIN HFA) 108 (90 Base) MCG/ACT inhaler Inhale 1-2 puffs into the lungs every 6 (six) hours as needed for wheezing or shortness of breath.    [provider]  amLODipine (NORVASC) 10 MG tablet Take 10 mg by mouth 2 (two) times daily.     [provider]  B Complex Vitamins (B COMPLEX PO) Take 1 tablet by mouth daily.    [provider]  B-D INS SYRINGE 0.5CC/30GX1/2" 30G X 1/2" 0.5 ML MISC TAKE AS DIRECTED TWICE A DAY 08/12/15   [provider]  BIOTIN 5000 PO Take 5,000 mcg by mouth daily.     [provider]  calcitRIOL (ROCALTROL) 0.25 MCG capsule Take 0.5 mcg by mouth daily.    [provider]  carvedilol (COREG) 6.25 MG tablet Take 6.25 mg by mouth in the morning and at bedtime.    [provider]  cetirizine (ZYRTEC) 10 MG tablet Take 10 mg by mouth daily as needed for allergies.    [provider]  cloNIDine (CATAPRES - DOSED IN MG/24 HR) 0.3 mg/24hr Place 0.3 mg onto the skin every Friday.     [provider]  Coenzyme Q10 100 MG capsule Take 100 mg by mouth daily.    [provider]  hydrALAZINE (APRESOLINE) 50 MG tablet Take 1 tablet (50 mg total) by mouth 3 (three) times daily. 05/02/20 07/31/20  Dessa Phi, DO  insulin glargine (LANTUS) 100 UNIT/ML injection Inject 10 Units into the skin at bedtime.    [provider]  isosorbide mononitrate (IMDUR) 60 MG 24 hr tablet Take 1 tablet (60 mg total) by mouth daily. 05/02/20 07/31/20  Dessa Phi, DO  levothyroxine (SYNTHROID) 200 MCG tablet Take 1 tablet (200 mcg total) by mouth daily before breakfast. 05/02/20 07/31/20  Dessa Phi, DO  metolazone (ZAROXOLYN) 5 MG tablet Take 1 tablet (5 mg total) by mouth 3 (three) times a week. Tuesdays Thursdays and Saturdays 05/02/20 07/31/20  Dessa Phi, DO  Multiple Vitamins-Minerals (PRESERVISION AREDS PO) Take 1 tablet by mouth in the morning and at bedtime.     [provider]  NON FORMULARY Take 1 tablet by mouth daily. Renadyl for kidneys    [provider]  Omega-3 Fatty Acids (FISH OIL) 500 MG CAPS Take 500 mg by mouth daily in the afternoon.    [provider]  polyethylene glycol (MIRALAX / GLYCOLAX) 17 g packet Take 17 g by mouth 2 (two) times daily. Patient taking differently: Take 17 g by mouth daily as needed for mild constipation. 07/02/19   Maurice March, PA-C  rosuvastatin (CRESTOR) 10 MG tablet Take 10 mg by mouth every evening.    [provider]  SYSTANE COMPLETE 0.6 % SOLN Place 1 drop into both eyes in the morning, at noon, in the evening, and at bedtime.    [provider]  torsemide (DEMADEX) 100 MG tablet Take 1 tablet (100 mg total) by mouth daily. 05/02/20 07/31/20  Dessa Phi, DO  TURMERIC PO Take 1 tablet by mouth daily.    [provider]  zolpidem (AMBIEN) 5 MG tablet TAKE 1 TABLET BY MOUTH AT BEDTIME AS NEEDED FOR SLEEP 05/24/20   Deneise Lever, MD    Allergies    Patient has no known allergies.  Review of Systems   Review of Systems  Respiratory: Negative for shortness of breath.   Cardiovascular: Negative for chest pain.  Gastrointestinal: Positive for abdominal pain, constipation and vomiting.  Genitourinary: Negative for dysuria.  All other systems reviewed and are negative.   Physical Exam Updated Vital Signs BP 131/62 (BP Location: Left Arm)   Pulse 67   Temp 98.3 F (36.8 C) (Oral)   Resp 19   Ht 5' 9.5" (1.765 m)   Wt 93.9 kg   SpO2 93%   BMI 30.13 kg/m   Physical Exam Vitals and nursing note reviewed.  Constitutional:      Appearance: She is well-developed. She is not diaphoretic.  HENT:     Head: Normocephalic and atraumatic.     Right Ear: External ear normal.     Left Ear: External ear normal.     Nose: Nose normal.     Mouth/Throat:     Mouth: Mucous membranes are dry.  Eyes:     General:        Right eye: No discharge.         Left eye: No discharge.  Cardiovascular:     Rate and Rhythm: Normal rate and regular rhythm.     Heart sounds: Murmur heard.    Pulmonary:     Effort: Pulmonary effort is normal.     Comments: Perhaps mild rales at bases Abdominal:     Palpations: Abdomen is soft.     Tenderness: There is abdominal tenderness in the periumbilical area, suprapubic area and left lower quadrant.  Genitourinary:    Rectum: No mass, tenderness or external hemorrhoid. Normal anal tone.     Comments: Some hard stool in rectal vault Skin:    General: Skin is warm and dry.  Neurological:     Mental Status: She is alert.  Psychiatric:        Mood and Affect: Mood is not anxious.     ED Results / Procedures / Treatments   Labs (all labs ordered are listed, but only abnormal results are displayed) Labs Reviewed  CBC - Abnormal; Notable for the following components:      Result Value   RBC 3.56 (*)    Hemoglobin 9.5 (*)    HCT 30.4 (*)    RDW 16.4 (*)    Platelets 95 (*)    All other components within normal limits  COMPREHENSIVE METABOLIC PANEL - Abnormal; Notable for the following components:   Potassium 3.4 (*)    Chloride 95 (*)    Glucose, Bld 135 (*)    BUN 199 (*)    Creatinine, Ser 9.36 (*)    GFR, Estimated 4 (*)    Anion gap 20 (*)    All other components within normal limits  LIPASE,  BLOOD - Abnormal; Notable for the following components:   Lipase 118 (*)    All other components within normal limits  I-STAT CHEM 8, ED - Abnormal; Notable for the following components:   Chloride 97 (*)    BUN >130 (*)    Creatinine, Ser 9.80 (*)    Glucose, Bld 135 (*)    Calcium, Ion 1.07 (*)    Hemoglobin 10.5 (*)    HCT 31.0 (*)    All other components within normal limits  SARS CORONAVIRUS 2 (TAT 6-24 HRS)    EKG None  Radiology CT ABDOMEN PELVIS WO CONTRAST  Result Date: 06/04/2020 CLINICAL DATA:  Nausea and vomiting, constipation for 1 week EXAM: CT ABDOMEN AND PELVIS WITHOUT  CONTRAST TECHNIQUE: Multidetector CT imaging of the abdomen and pelvis was performed following the standard protocol without IV contrast. COMPARISON:  06/04/2020 FINDINGS: Lower chest: No acute pleural or parenchymal lung disease. Mild cardiomegaly with trace pericardial effusion. Unenhanced CT was performed per clinician order. Lack of IV contrast limits sensitivity and specificity, especially for evaluation of abdominal/pelvic solid viscera. Hepatobiliary: Gallbladder is moderately distended without calcified gallstones. No gallbladder wall thickening. Unenhanced imaging of the liver is unremarkable. Pancreas: Unremarkable. No pancreatic ductal dilatation or surrounding inflammatory changes. Spleen: Normal in size without focal abnormality. Adrenals/Urinary Tract: No urinary tract calculi or obstructive uropathy within either kidney. Bilateral renal cortical cysts are noted. The adrenals and bladder are unremarkable. Stomach/Bowel: No bowel obstruction or ileus. Normal appendix right lower quadrant. Diverticulosis of the sigmoid colon without diverticulitis. Moderate retained stool throughout the colon. Vascular/Lymphatic: Aortic atherosclerosis. No enlarged abdominal or pelvic lymph nodes. Reproductive: Status post hysterectomy. No adnexal masses. Other: Trace free fluid within the bilateral flanks. No free intraperitoneal gas. No abdominal wall hernia. Musculoskeletal: No acute or destructive bony lesions. Reconstructed images demonstrate no additional findings. IMPRESSION: 1. No urinary tract calculi or obstructive uropathy. 2. Moderate fecal retention. 3. Trace free fluid within the bilateral flanks, nonspecific. 4. Sigmoid diverticulosis without diverticulitis. 5.  Aortic Atherosclerosis (ICD10-I70.0). Electronically Signed   By: Randa Ngo M.D.   On: 06/04/2020 18:15   DG ABD ACUTE 2+V W 1V CHEST  Result Date: 06/04/2020 CLINICAL DATA:  Pain, vomiting. EXAM: DG ABDOMEN ACUTE WITH 1 VIEW CHEST  COMPARISON:  Chest x-rays dated 04/26/2020, 04/17/2020 and 01/12/2016. FINDINGS: Single-view of the chest: Stable cardiomegaly. Central pulmonary vascular congestion and mild bilateral interstitial prominence, presumably interstitial edema. No confluent opacity to suggest a consolidating pneumonia. No pleural effusion or pneumothorax is seen. Supine and upright views of the abdomen: No dilated large or small bowel loops. No evidence of free intraperitoneal air. No evidence of renal or ureteral calculi. No acute appearing osseous abnormality. Degenerative spondylosis of lumbosacral spine, with overlying fixation hardware in place. IMPRESSION: 1. Cardiomegaly with central pulmonary vascular congestion and mild bilateral interstitial edema, compatible with CHF/volume overload, likely chronic. 2. Nonobstructive bowel gas pattern and no evidence of acute intra-abdominal abnormality. Electronically Signed   By: Franki Cabot M.D.   On: 06/04/2020 14:28    Procedures Procedures   Medications Ordered in ED Medications  acetaminophen (TYLENOL) tablet 650 mg (has no administration in time range)    Or  acetaminophen (TYLENOL) suppository 650 mg (has no administration in time range)  bisacodyl (DULCOLAX) suppository 10 mg (has no administration in time range)  ondansetron (ZOFRAN) tablet 4 mg (has no administration in time range)    Or  ondansetron (ZOFRAN) injection 4 mg (has no administration in time  range)  HYDROmorphone (DILAUDID) injection 0.5-1 mg (1 mg Intravenous Given 06/04/20 2101)  morphine CONCENTRATE 10 MG/0.5ML oral solution 5 mg (has no administration in time range)    Or  morphine CONCENTRATE 10 MG/0.5ML oral solution 5 mg (has no administration in time range)  traZODone (DESYREL) tablet 25 mg (has no administration in time range)  LORazepam (ATIVAN) tablet 1 mg ( Oral See Alternative 06/04/20 2055)    Or  LORazepam (ATIVAN) 2 MG/ML concentrated solution 1 mg ( Sublingual See Alternative  06/04/20 2055)    Or  LORazepam (ATIVAN) injection 1 mg (1 mg Intravenous Given 06/04/20 2055)  ondansetron (ZOFRAN) injection 4 mg (4 mg Intravenous Given 06/04/20 1631)  fentaNYL (SUBLIMAZE) injection 50 mcg (50 mcg Intravenous Given 06/04/20 1634)  sodium chloride 0.9 % bolus 500 mL (0 mLs Intravenous Stopped 06/04/20 1728)  HYDROmorphone (DILAUDID) injection 0.5 mg (0.5 mg Intravenous Given 06/04/20 1711)    ED Course  I have reviewed the triage vital signs and the nursing notes.  Pertinent labs & imaging results that were available during my care of the patient were reviewed by me and considered in my medical decision making (see chart for details).    MDM Rules/Calculators/A&P                          Patient's CT shows significant constipation. Some has come out with enema and manual removal.  However her labs do show severe renal failure with BUN of nearly 200.  She reiterates to me that she does not want dialysis.  Sister confirms this.  At this point she probably needs hospice.  She will get symptomatic care and I have discussed with hospitalist for admission.  Originally discussed with Dr. Hollie Salk of nephrology but at this point the consult has been canceled by hospitalist because she really just needs to go to hospice. Final Clinical Impression(s) / ED Diagnoses Final diagnoses:  Acute renal failure superimposed on chronic kidney disease, unspecified CKD stage, unspecified acute renal failure type [facility])    Rx / DC Orders ED Discharge Orders    None       Sherwood Gambler, MD 06/04/20 2306

## 2020-06-04 NOTE — H&P (Addendum)
History and Physical:    Tiffany [name]   MEQ:[id number] DOB: [birth year] DOA: 06/04/2020  Referring MD/provider: Dr. Regenia Skeeter PCP: Nolene Ebbs, MD   Patient coming from: Home  Chief Complaint: Nausea, vomiting, inability to keep down any p.o.'s for a week  History of Present Illness:   Tiffany [name] is an 70 y.o. female with PMH significant for HTN, HFpEF, OSA, DM 2, CKD stage V who has refused dialysis in the past and has met with palliative care at home was in her usual state of declining health until last week when she developed nausea followed by vomiting.  Patient states she has not had a good meal in 1 week.  She has been self treating symptomatically however decided to come in today because she was feeling weak and could not stop vomiting.  She also has had significant abdominal pain which is worsened over the past couple of days.  She has been constipated.  ED Course:  The patient was noted to be afebrile and normotensive.  Laboratory data were notable for BUN of 200 and a creatinine of 9.4 up from a creatinine of 4 2 months ago.  CT abdomen showed no obstruction or evidence of infection but did show constipation.  Nephrology was consulted and patient is admitted for intractable nausea and vomiting and worsening abdominal pain likely secondary to constipation.  Patient reiterated that she is not interested in dialysis to the ED physician.  ROS:   ROS   Review of Systems: Patient does not want to go through review of systems, states she is ready to die and does not want any treatment for anything.   Past Medical History:   Past Medical History:  Diagnosis Date  . ALLERGIC RHINITIS   . Arthritis   . CHF (congestive heart failure) ([place])   . Chronic diastolic heart failure ([place]) 05/05/2013   Echo 03/2018:  EF 29-79, +diastolic dysfunction, GLS -20.5%, normal RVSF, RVSP 42.5 (mild elevated), severe LAE, small pericardial eff, mild MAc, mod AV calc, mild PI  . CKD  (chronic kidney disease)   . Diabetes mellitus   . Dyspnea   . Heart murmur    at birth  . Hyperlipidemia   . Hypertension   . Hypothyroid   . Iron deficiency anemia   . Obesity   . OSA on CPAP   . Previous back surgery    x 2  . Refusal of blood transfusions as patient is Jehovah's Witness   . S/P arthroscopy     Past Surgical History:   Past Surgical History:  Procedure Laterality Date  . BREAST BIOPSY  20+ yrs ago   dont know which breast per pt; benign  . BREAST REDUCTION SURGERY  1982  . KNEE SURGERY     x 2  . LUMBAR DISC SURGERY     x 2  . REDUCTION MAMMAPLASTY Bilateral 1985  . TOE SURGERY    . TOTAL ABDOMINAL HYSTERECTOMY  1997   partial hysterectomy- still has ovaries  . TOTAL KNEE ARTHROPLASTY Left 07/01/2019   Procedure: TOTAL KNEE ARTHROPLASTY;  Surgeon: Paralee Cancel, MD;  Location: [facility];  Service: Orthopedics;  Laterality: Left;  70 min NO BLOOD PRODUCTS!!  . TREATMENT FISTULA ANAL      Social History:   Social History   Socioeconomic History  . Marital status: Single    Spouse name: Not on file  . Number of children: Not on file  . Years of education: Not on  file  . Highest education level: Not on file  Occupational History  . Occupation: school bus driver  Tobacco Use  . Smoking status: Never Smoker  . Smokeless tobacco: Never Used  Vaping Use  . Vaping Use: Never used  Substance and Sexual Activity  . Alcohol use: Yes    Alcohol/week: 0.0 standard drinks    Comment: rare  . Drug use: No  . Sexual activity: Not Currently    Birth control/protection: Surgical  Other Topics Concern  . Not on file  Social History Narrative  . Not on file   Social Determinants of Health   Financial Resource Strain: Not on file  Food Insecurity: Not on file  Transportation Needs: Not on file  Physical Activity: Not on file  Stress: Not on file  Social Connections: Not on file  Intimate Partner Violence: Not on file    Allergies   Patient has  no known allergies.  Family history:   Family History  Problem Relation Age of Onset  . Prostate cancer Father   . Congestive Heart Failure Mother   . Diabetes Sister   . Other Sister        septis  . Breast cancer Neg Hx     Current Medications:   Prior to Admission medications   Medication Sig Start Date End Date Taking? Authorizing Provider  acetaminophen (TYLENOL) 650 MG CR tablet Take 650 mg by mouth every 8 (eight) hours as needed for pain.    [provider]  albuterol (VENTOLIN HFA) 108 (90 Base) MCG/ACT inhaler Inhale 1-2 puffs into the lungs every 6 (six) hours as needed for wheezing or shortness of breath.    [provider]  allopurinol (ZYLOPRIM) 100 MG tablet Take 100 mg by mouth 2 (two) times daily. 07/30/16   [provider]  amLODipine (NORVASC) 10 MG tablet Take 10 mg by mouth 2 (two) times daily.     [provider]  B Complex Vitamins (B COMPLEX PO) Take 1 tablet by mouth daily.    [provider]  B-D INS SYRINGE 0.5CC/30GX1/2" 30G X 1/2" 0.5 ML MISC TAKE AS DIRECTED TWICE A DAY 08/12/15   [provider]  BIOTIN 5000 PO Take 5,000 mcg by mouth daily.     [provider]  calcitRIOL (ROCALTROL) 0.25 MCG capsule Take 0.5 mcg by mouth daily.    [provider]  carvedilol (COREG) 6.25 MG tablet Take 6.25 mg by mouth in the morning and at bedtime.    [provider]  cetirizine (ZYRTEC) 10 MG tablet Take 10 mg by mouth daily as needed for allergies.    [provider]  cloNIDine (CATAPRES - DOSED IN MG/24 HR) 0.3 mg/24hr Place 0.3 mg onto the skin every Friday.     [provider]  Coenzyme Q10 100 MG capsule Take 100 mg by mouth daily.    [provider]  hydrALAZINE (APRESOLINE) 50 MG tablet Take 1 tablet (50 mg total) by mouth 3 (three) times daily. 05/02/20 07/31/20  Dessa Phi, DO  insulin glargine (LANTUS) 100 UNIT/ML injection Inject 10 Units into the skin  at bedtime.    [provider]  isosorbide mononitrate (IMDUR) 60 MG 24 hr tablet Take 1 tablet (60 mg total) by mouth daily. 05/02/20 07/31/20  Dessa Phi, DO  levothyroxine (SYNTHROID) 200 MCG tablet Take 1 tablet (200 mcg total) by mouth daily before breakfast. 05/02/20 07/31/20  Dessa Phi, DO  metolazone (ZAROXOLYN) 5 MG tablet Take  1 tablet (5 mg total) by mouth 3 (three) times a week. Tuesdays Thursdays and Saturdays 05/02/20 07/31/20  Dessa Phi, DO  Multiple Vitamins-Minerals (PRESERVISION AREDS PO) Take 1 tablet by mouth in the morning and at bedtime.    [provider]  NON FORMULARY Take 1 tablet by mouth daily. Renadyl for kidneys    [provider]  Omega-3 Fatty Acids (FISH OIL) 500 MG CAPS Take 500 mg by mouth daily in the afternoon.    [provider]  polyethylene glycol (MIRALAX / GLYCOLAX) 17 g packet Take 17 g by mouth 2 (two) times daily. Patient taking differently: Take 17 g by mouth daily as needed for mild constipation. 07/02/19   Maurice March, PA-C  rosuvastatin (CRESTOR) 10 MG tablet Take 10 mg by mouth every evening.    [provider]  SYSTANE COMPLETE 0.6 % SOLN Place 1 drop into both eyes in the morning, at noon, in the evening, and at bedtime.    [provider]  torsemide (DEMADEX) 100 MG tablet Take 1 tablet (100 mg total) by mouth daily. 05/02/20 07/31/20  Dessa Phi, DO  TURMERIC PO Take 1 tablet by mouth daily.    [provider]  zolpidem (AMBIEN) 5 MG tablet TAKE 1 TABLET BY MOUTH AT BEDTIME AS NEEDED FOR SLEEP 05/24/20   Baird Lyons D, MD    Physical Exam:   Vitals:   06/04/20 1626 06/04/20 1712 06/04/20 1715 06/04/20 1730  BP: 130/68 124/65 122/69 120/62  Pulse: 63 66    Resp: _0 Temp: 98 F (36.7 C)     TempSrc: Oral     SpO2: 100% 99%  99%     Physical Exam: Blood pressure 120/62, pulse 66, temperature 98 F (36.7 C), temperature source Oral, resp. rate 12,  SpO2 99 %. Gen: Sleepy appearing female lying in bed complaining of some abdominal pain. Eyes: sclera anicteric, CVS: S1-S2, regulary, no gallops Respiratory:  decreased air entry likely secondary to decreased inspiratory effort GI: NABS, soft, mild diffuse tenderness throughout abdomen, no rebound tenderness. LE: No edema. No cyanosis Neuro: A/O x 3, Moving all extremities equally, grossly nonfocal.  Psych: patient is logical and coherent.  Patient is aware that refusing hemodialysis at this point will lead to her death.  Patient states that she is ready to die and is requesting terminal sedation.   Data Review:    Labs: Basic Metabolic Panel: Recent Labs  Lab 06/04/20 1311 06/04/20 1327  NA 137 137  K 3.4* 3.5  CL 95* 97*  CO2 22  --   GLUCOSE 135* 135*  BUN 199* >130*  CREATININE 9.36* 9.80*  CALCIUM 9.4  --    Liver Function Tests: Recent Labs  Lab 06/04/20 1311  AST 17  ALT 12  ALKPHOS 80  BILITOT 0.6  PROT 7.0  ALBUMIN 3.6   Recent Labs  Lab 06/04/20 1311  LIPASE 118*   No results for input(s): AMMONIA in the last 168 hours. CBC: Recent Labs  Lab 06/04/20 1311 06/04/20 1327  WBC 5.5  --   HGB 9.5* 10.5*  HCT 30.4* 31.0*  MCV 85.4  --   PLT 95*  --    Cardiac Enzymes: No results for input(s): CKTOTAL, CKMB, CKMBINDEX, TROPONINI in the last 168 hours.  BNP (last 3 results) Recent Labs    01/25/20 1506  PROBNP 2,507*   CBG: No results for input(s): GLUCAP in the last 168 hours.  Urinalysis  Component Value Date/Time   COLORURINE YELLOW 04/17/2020 1222   APPEARANCEUR CLEAR 04/17/2020 1222   LABSPEC 1.013 04/17/2020 1222   PHURINE 5.0 04/17/2020 1222   GLUCOSEU NEGATIVE 04/17/2020 1222   HGBUR NEGATIVE 04/17/2020 1222   BILIRUBINUR NEGATIVE 04/17/2020 1222   KETONESUR NEGATIVE 04/17/2020 1222   PROTEINUR 100 (A) 04/17/2020 1222   UROBILINOGEN 0.2 02/25/2019 1939   NITRITE NEGATIVE 04/17/2020 1222   LEUKOCYTESUR NEGATIVE 04/17/2020  1222      Radiographic Studies: CT ABDOMEN PELVIS WO CONTRAST  Result Date: 06/04/2020 CLINICAL DATA:  Nausea and vomiting, constipation for 1 week EXAM: CT ABDOMEN AND PELVIS WITHOUT CONTRAST TECHNIQUE: Multidetector CT imaging of the abdomen and pelvis was performed following the standard protocol without IV contrast. COMPARISON:  06/04/2020 FINDINGS: Lower chest: No acute pleural or parenchymal lung disease. Mild cardiomegaly with trace pericardial effusion. Unenhanced CT was performed per clinician order. Lack of IV contrast limits sensitivity and specificity, especially for evaluation of abdominal/pelvic solid viscera. Hepatobiliary: Gallbladder is moderately distended without calcified gallstones. No gallbladder wall thickening. Unenhanced imaging of the liver is unremarkable. Pancreas: Unremarkable. No pancreatic ductal dilatation or surrounding inflammatory changes. Spleen: Normal in size without focal abnormality. Adrenals/Urinary Tract: No urinary tract calculi or obstructive uropathy within either kidney. Bilateral renal cortical cysts are noted. The adrenals and bladder are unremarkable. Stomach/Bowel: No bowel obstruction or ileus. Normal appendix right lower quadrant. Diverticulosis of the sigmoid colon without diverticulitis. Moderate retained stool throughout the colon. Vascular/Lymphatic: Aortic atherosclerosis. No enlarged abdominal or pelvic lymph nodes. Reproductive: Status post hysterectomy. No adnexal masses. Other: Trace free fluid within the bilateral flanks. No free intraperitoneal gas. No abdominal wall hernia. Musculoskeletal: No acute or destructive bony lesions. Reconstructed images demonstrate no additional findings. IMPRESSION: 1. No urinary tract calculi or obstructive uropathy. 2. Moderate fecal retention. 3. Trace free fluid within the bilateral flanks, nonspecific. 4. Sigmoid diverticulosis without diverticulitis. 5.  Aortic Atherosclerosis (ICD10-I70.0). Electronically  Signed   By: Randa Ngo M.D.   On: 06/04/2020 18:15   DG ABD ACUTE 2+V W 1V CHEST  Result Date: 06/04/2020 CLINICAL DATA:  Pain, vomiting. EXAM: DG ABDOMEN ACUTE WITH 1 VIEW CHEST COMPARISON:  Chest x-rays dated 04/26/2020, 04/17/2020 and 01/12/2016. FINDINGS: Single-view of the chest: Stable cardiomegaly. Central pulmonary vascular congestion and mild bilateral interstitial prominence, presumably interstitial edema. No confluent opacity to suggest a consolidating pneumonia. No pleural effusion or pneumothorax is seen. Supine and upright views of the abdomen: No dilated large or small bowel loops. No evidence of free intraperitoneal air. No evidence of renal or ureteral calculi. No acute appearing osseous abnormality. Degenerative spondylosis of lumbosacral spine, with overlying fixation hardware in place. IMPRESSION: 1. Cardiomegaly with central pulmonary vascular congestion and mild bilateral interstitial edema, compatible with CHF/volume overload, likely chronic. 2. Nonobstructive bowel gas pattern and no evidence of acute intra-abdominal abnormality. Electronically Signed   By: Franki Cabot M.D.   On: 06/04/2020 14:28    EKG: And a   Assessment/Plan:   Active Problems:   ESRD (end stage renal disease) ([place])  70 year old female now presents with uremia and end-stage renal disease with nausea vomiting and abdominal pain.  Long and extensive conversation with patient and her next of kin, her Sister Jethro Bastos confirms that patient does not want to have hemodialysis.  She states that she is ready to die and is requesting terminal sedation.  She states she is already discussed this with palliative care but did not find them very helpful.  She  is open to hospice referral and states that she does not have anybody at home to help her.  However her biggest concern right now is to die without pain and peacefully.  Comfort care As noted above, this was discussed at length with patient and her next  of kin her Sister Jethro Bastos.  They both agree that patient is clear that she does not want to have hemodialysis and that she is ready to die. Discussed hospice and both patient and her sister are interested in referral to hospice. They both agree to stop all of her medications including her antihypertensives, her heart failure medications and her diuretics. She is aware that she will be given oxygen, morphine, pain management, nausea management and Ativan as needed. Comfort care orders placed along with palliative care medications as above. TOC consult placed for hospice referral in the morning.  Palliative care consult placed although she has already spoken with them. Patient is DNR.    Other information:   DVT prophylaxis: None ordered. Code Status: DNR Family Communication: Patient's Sister Jethro Bastos was at bedside throughout Disposition Plan: Hospice Consults called: Nephrology consult canceled, hospice to be called in the morning. Admission status: Observation  Yesica Kemler Tublu [facility] [place] [facility]  If 7PM-7AM, please contact night-coverage [email or web address]

## 2020-06-04 NOTE — ED Notes (Signed)
Pt back from ct

## 2020-06-05 DIAGNOSIS — Z8249 Family history of ischemic heart disease and other diseases of the circulatory system: Secondary | ICD-10-CM | POA: Diagnosis not present

## 2020-06-05 DIAGNOSIS — I5032 Chronic diastolic (congestive) heart failure: Secondary | ICD-10-CM | POA: Diagnosis present

## 2020-06-05 DIAGNOSIS — Z66 Do not resuscitate: Secondary | ICD-10-CM | POA: Diagnosis present

## 2020-06-05 DIAGNOSIS — E039 Hypothyroidism, unspecified: Secondary | ICD-10-CM | POA: Diagnosis present

## 2020-06-05 DIAGNOSIS — D638 Anemia in other chronic diseases classified elsewhere: Secondary | ICD-10-CM | POA: Diagnosis present

## 2020-06-05 DIAGNOSIS — N189 Chronic kidney disease, unspecified: Secondary | ICD-10-CM | POA: Diagnosis not present

## 2020-06-05 DIAGNOSIS — G4733 Obstructive sleep apnea (adult) (pediatric): Secondary | ICD-10-CM | POA: Diagnosis present

## 2020-06-05 DIAGNOSIS — E785 Hyperlipidemia, unspecified: Secondary | ICD-10-CM | POA: Diagnosis present

## 2020-06-05 DIAGNOSIS — N179 Acute kidney failure, unspecified: Secondary | ICD-10-CM

## 2020-06-05 DIAGNOSIS — Z833 Family history of diabetes mellitus: Secondary | ICD-10-CM | POA: Diagnosis not present

## 2020-06-05 DIAGNOSIS — K5900 Constipation, unspecified: Secondary | ICD-10-CM | POA: Diagnosis present

## 2020-06-05 DIAGNOSIS — Z96652 Presence of left artificial knee joint: Secondary | ICD-10-CM | POA: Diagnosis present

## 2020-06-05 DIAGNOSIS — I132 Hypertensive heart and chronic kidney disease with heart failure and with stage 5 chronic kidney disease, or end stage renal disease: Secondary | ICD-10-CM | POA: Diagnosis present

## 2020-06-05 DIAGNOSIS — N186 End stage renal disease: Secondary | ICD-10-CM | POA: Diagnosis present

## 2020-06-05 DIAGNOSIS — J309 Allergic rhinitis, unspecified: Secondary | ICD-10-CM | POA: Diagnosis present

## 2020-06-05 DIAGNOSIS — Z79899 Other long term (current) drug therapy: Secondary | ICD-10-CM | POA: Diagnosis not present

## 2020-06-05 DIAGNOSIS — Z79890 Hormone replacement therapy (postmenopausal): Secondary | ICD-10-CM | POA: Diagnosis not present

## 2020-06-05 DIAGNOSIS — Z515 Encounter for palliative care: Secondary | ICD-10-CM | POA: Diagnosis not present

## 2020-06-05 DIAGNOSIS — E1122 Type 2 diabetes mellitus with diabetic chronic kidney disease: Secondary | ICD-10-CM | POA: Diagnosis present

## 2020-06-05 DIAGNOSIS — Z794 Long term (current) use of insulin: Secondary | ICD-10-CM | POA: Diagnosis not present

## 2020-06-05 DIAGNOSIS — Z20822 Contact with and (suspected) exposure to covid-19: Secondary | ICD-10-CM | POA: Diagnosis present

## 2020-06-05 DIAGNOSIS — Z90710 Acquired absence of both cervix and uterus: Secondary | ICD-10-CM | POA: Diagnosis not present

## 2020-06-05 LAB — SARS CORONAVIRUS 2 (TAT 6-24 HRS): SARS Coronavirus 2: NEGATIVE

## 2020-06-05 MED ORDER — BISACODYL 10 MG RE SUPP
10.0000 mg | Freq: Once | RECTAL | Status: AC
  Administered 2020-06-06: 10 mg via RECTAL
  Filled 2020-06-05: qty 1

## 2020-06-05 MED ORDER — SENNOSIDES-DOCUSATE SODIUM 8.6-50 MG PO TABS
1.0000 | ORAL_TABLET | Freq: Two times a day (BID) | ORAL | Status: DC
  Administered 2020-06-05 – 2020-06-07 (×5): 1 via ORAL
  Filled 2020-06-05 (×5): qty 1

## 2020-06-05 MED ORDER — POLYETHYLENE GLYCOL 3350 17 G PO PACK
17.0000 g | PACK | Freq: Every day | ORAL | Status: DC
  Administered 2020-06-05 – 2020-06-07 (×3): 17 g via ORAL
  Filled 2020-06-05 (×3): qty 1

## 2020-06-05 NOTE — Progress Notes (Signed)
Manufacturing engineer [facility]) Hospital Liaison note.    Received request from [facility] for family interest in [facility]. [facility] is unable to offer a room today. Hospital Liaison will follow up tomorrow or sooner if a room becomes available and eligibility is confirmed.   Please do not hesitate to call with questions.    Thank you,   Farrel Gordon, RN, Post (listed on [facility] under [place])    [phone]

## 2020-06-05 NOTE — TOC Initial Note (Signed)
Transition of Care [facility]) - Initial/Assessment Note    Patient Details  Name: Tiffany [name] MRN: [id number] Date of Birth: [birth year]  [facility]) CM/SW Contact:    Bartholomew Crews, RN Phone Number: [phone] 06/05/2020, 4:23 PM  Clinical Narrative:                  Acknowledging [facility] consult for residential hospice. Spoke with patient and sister at the bedside. Confirmed desire for residential hospice and offered choice. Referral to [facility] pending. TOC following for transition needs.   Expected Discharge Plan: [place] Barriers to Discharge: Hospice Bed not available   Patient Goals and CMS Choice Patient states their goals for this hospitalization and ongoing recovery are:: residential hospice CMS Medicare.gov Compare Post Acute Care list provided to:: Patient Choice offered to / list presented to : Patient,Sibling Pamala Hurry (sister))  Expected Discharge Plan and Services Expected Discharge Plan: [place] In-house Referral: Hospice / [place] Work Discharge Planning Services: CM Consult Post Acute Care Choice: Hospice                   DME Arranged: N/A DME Agency: NA       HH Arranged: NA HH Agency: NA        Prior Living Arrangements/Services   Lives with:: Self Patient language and need for interpreter reviewed:: Yes        Need for Family Participation in Patient Care: Yes (Comment) Care giver support system in place?: Yes (comment)   Criminal Activity/Legal Involvement Pertinent to Current Situation/Hospitalization: No - Comment as needed  Activities of Daily Living      Permission Sought/Granted Permission sought to share information with : Family Supports    Share Information with NAME: Jethro Bastos     Permission granted to share info w Relationship: sister  Permission granted to share info w Contact Information: [phone]  Emotional Assessment Appearance:: Appears  stated age Attitude/Demeanor/Rapport: Engaged Affect (typically observed): Accepting Orientation: : Oriented to Self,Oriented to  Time,Oriented to Place,Oriented to Situation Alcohol / Substance Use: Not Applicable Psych Involvement: No (comment)  Admission diagnosis:  ESRD (end stage renal disease) ([place]) [N18.6] Patient Active Problem List   Diagnosis Date Noted  . ESRD (end stage renal disease) ([place]) 06/04/2020  . Acute on chronic diastolic (congestive) heart failure ([place]) 04/26/2020  . Type 2 diabetes mellitus with stage 5 chronic kidney disease ([place]) 04/26/2020  . Acute CHF (congestive heart failure) ([place]) 02/01/2020  . S/P left TKA 07/01/2019  . Status post total left knee replacement 07/01/2019  . Hyperlipidemia 09/12/2016  . Type 2 diabetes mellitus without complications ([place]) 17/51/0258  . Tachycardia 08/26/2016  . Palpitation 08/26/2016  . SOB (shortness of breath) 08/26/2016  . Chronic kidney disease, stage V ([place]) 04/28/2014  . Chronic diastolic heart failure ([place]) 05/05/2013  . Essential hypertension 05/05/2013  . HEART MURMUR, SYSTOLIC 52/77/8242  . Hypothyroidism 02/12/2007  . OBESITY 01/08/2007  . Obstructive sleep apnea 01/08/2007  . Seasonal and perennial allergic rhinitis 01/08/2007   PCP:  Nolene Ebbs, MD Pharmacy:   [id number] - [place], [place] [address] [place] [address] Phone: [phone] Fax: [phone]     Social Determinants of Health (SDOH) Interventions    Readmission Risk Interventions Readmission Risk Prevention Plan 05/02/2020  Transportation Screening Complete  PCP or Specialist Appt within 3-5 Days Complete  Social Work Consult for Recovery  Care Planning/Counseling Complete  Palliative Care Screening Not Applicable  Medication Review (RN Care Manager) Complete  Some recent data might be hidden

## 2020-06-05 NOTE — Plan of Care (Signed)

## 2020-06-05 NOTE — Progress Notes (Addendum)
PROGRESS NOTE    Tiffany [name]  YKD:[id number] DOB: January 11, 1951 DOA: 06/04/2020 PCP: Nolene Ebbs, MD  Brief Narrative: 70 year old female with history of type 2 diabetes mellitus, hypertension, OSA, chronic diastolic CHF and CKD stage V who has refused hemodialysis and has met with palliative care at home presented to the ED with abdominal pain and multiple episodes of nausea and vomiting.  Also complains of constipation.  Labs in the ED were notable for creatinine of 9.4 and a BUN of 200, CT abdomen did not note any obstruction but showed constipation   Assessment & Plan:   End-stage renal disease Uremia -she still makes some urine and surprisingly is awake alert and appropriate despite her BUN -See discussion above, she has reiterated on numerous occasions regarding her wishes not to pursue HD -Antiemetics, attempt p.o. intake today -TOC consult  Goals of care -Patient wants comfort focused care, she understands the consequences of not pursuing hemodialysis -Patient's brother and sister at bedside also understand her clearly stated wishes for very long time -Continue Roxanol and Ativan as needed -Add laxatives today -Lives alone, unable to care for self, TOC consult for residential hospice  Constipation -Add laxatives  Diabetes mellitus type 2 Anemia of chronic disease OSA Chronic diastolic CHF  DVT prophylaxis: None Code Status: DNR Family Communication: Sister and brother at bedside Disposition Plan:  Status is: Observation  The patient will require care spanning > 2 midnights and should be moved to inpatient because: Inpatient level of care appropriate due to severity of illness  Dispo: The patient is from: Home              Anticipated d/c is to: Residential hospice              Patient currently is not medically stable to d/c.   Difficult to place patient No    Procedures:   Antimicrobials:    Subjective: -Belly feels a little better, no bowel movement  yet  Objective: Vitals:   06/04/20 1730 06/04/20 1955 06/04/20 2048 06/05/20 0454  BP: 120/62 136/66 131/62 (!) 123/59  Pulse:  67 67 68  Resp: 12 12 19 18   Temp:  97.7 F (36.5 C) 98.3 F (36.8 C) 98.3 F (36.8 C)  TempSrc:  Oral Oral Oral  SpO2: 99% 99% 93% 100%  Weight:   93.9 kg   Height:   5' 9.5" (1.765 m)     Intake/Output Summary (Last 24 hours) at 06/05/2020 1108 Last data filed at 06/05/2020 0900 Gross per 24 hour  Intake 60 ml  Output --  Net 60 ml   Filed Weights   06/04/20 2048  Weight: 93.9 kg    Examination:  General exam: Pleasant chronically ill female laying in bed, eyes closed, awake alert oriented to self and place, appropriate able to carry normal conversation CVS: S1-S2, regular rate rhythm Lungs: Decreased breath sounds the bases otherwise clear Abdomen: Soft, nontender, nondistended, bowel sounds present Extremities: Trace edema Skin: No rashes on exposed skin  Data Reviewed:   CBC: Recent Labs  Lab 06/04/20 1311 06/04/20 1327  WBC 5.5  --   HGB 9.5* 10.5*  HCT 30.4* 31.0*  MCV 85.4  --   PLT 95*  --    Basic Metabolic Panel: Recent Labs  Lab 06/04/20 1311 06/04/20 1327  NA 137 137  K 3.4* 3.5  CL 95* 97*  CO2 22  --   GLUCOSE 135* 135*  BUN 199* >130*  CREATININE 9.36* 9.80*  CALCIUM  9.4  --    GFR: Estimated Creatinine Clearance: 6.7 mL/min (A) (by C-G formula based on SCr of 9.8 mg/dL (H)). Liver Function Tests: Recent Labs  Lab 06/04/20 1311  AST 17  ALT 12  ALKPHOS 80  BILITOT 0.6  PROT 7.0  ALBUMIN 3.6   Recent Labs  Lab 06/04/20 1311  LIPASE 118*   No results for input(s): AMMONIA in the last 168 hours. Coagulation Profile: No results for input(s): INR, PROTIME in the last 168 hours. Cardiac Enzymes: No results for input(s): CKTOTAL, CKMB, CKMBINDEX, TROPONINI in the last 168 hours. BNP (last 3 results) Recent Labs    01/25/20 1506  PROBNP 2,507*   HbA1C: No results for input(s): HGBA1C in the  last 72 hours. CBG: No results for input(s): GLUCAP in the last 168 hours. Lipid Profile: No results for input(s): CHOL, HDL, LDLCALC, TRIG, CHOLHDL, LDLDIRECT in the last 72 hours. Thyroid Function Tests: No results for input(s): TSH, T4TOTAL, FREET4, T3FREE, THYROIDAB in the last 72 hours. Anemia Panel: No results for input(s): VITAMINB12, FOLATE, FERRITIN, TIBC, IRON, RETICCTPCT in the last 72 hours. Urine analysis:    Component Value Date/Time   COLORURINE YELLOW 04/17/2020 [place] 04/17/2020 1222   LABSPEC 1.013 04/17/2020 1222   PHURINE 5.0 04/17/2020 [place] 04/17/2020 1222   HGBUR NEGATIVE 04/17/2020 1222   [place] 04/17/2020 1222   KETONESUR NEGATIVE 04/17/2020 1222   PROTEINUR 100 (A) 04/17/2020 1222   UROBILINOGEN 0.2 02/25/2019 1939   NITRITE NEGATIVE 04/17/2020 1222   LEUKOCYTESUR NEGATIVE 04/17/2020 1222   Sepsis Labs: @LABRCNTIP (procalcitonin:4,lacticidven:4)  ) Recent Results (from the past 240 hour(s))  SARS CORONAVIRUS 2 (TAT 6-24 HRS) Nasopharyngeal Nasopharyngeal Swab     Status: None   Collection Time: 06/04/20  7:21 PM   Specimen: Nasopharyngeal Swab  Result Value Ref Range Status   SARS Coronavirus 2 NEGATIVE NEGATIVE Final    Comment: (NOTE) SARS-CoV-2 target nucleic acids are NOT DETECTED.  The SARS-CoV-2 RNA is generally detectable in upper and lower respiratory specimens during the acute phase of infection. Negative results do not preclude SARS-CoV-2 infection, do not rule out co-infections with other pathogens, and should not be used as the sole basis for treatment or other patient management decisions. Negative results must be combined with clinical observations, patient history, and epidemiological information. The expected result is Negative.  Fact Sheet for Patients: SugarRoll.be  Fact Sheet for Healthcare  Providers: https://www.woods-mathews.com/  This test is not yet approved or cleared by the [place] FDA and  has been authorized for detection and/or diagnosis of SARS-CoV-2 by FDA under an Emergency Use Authorization (EUA). This EUA will remain  in effect (meaning this test can be used) for the duration of the COVID-19 declaration under Se ction 564(b)(1) of the Act, 21 U.S.C. section 360bbb-3(b)(1), unless the authorization is terminated or revoked sooner.  Performed at [facility], [place] [address], [place],  [address]          Radiology Studies: CT ABDOMEN PELVIS WO CONTRAST  Result Date: 06/04/2020 CLINICAL DATA:  Nausea and vomiting, constipation for 1 week EXAM: CT ABDOMEN AND PELVIS WITHOUT CONTRAST TECHNIQUE: Multidetector CT imaging of the abdomen and pelvis was performed following the standard protocol without IV contrast. COMPARISON:  06/04/2020 FINDINGS: Lower chest: No acute pleural or parenchymal lung disease. Mild cardiomegaly with trace pericardial effusion. Unenhanced CT was performed per clinician order. Lack of IV contrast limits sensitivity and specificity, especially for  evaluation of abdominal/pelvic solid viscera. Hepatobiliary: Gallbladder is moderately distended without calcified gallstones. No gallbladder wall thickening. Unenhanced imaging of the liver is unremarkable. Pancreas: Unremarkable. No pancreatic ductal dilatation or surrounding inflammatory changes. Spleen: Normal in size without focal abnormality. Adrenals/Urinary Tract: No urinary tract calculi or obstructive uropathy within either kidney. Bilateral renal cortical cysts are noted. The adrenals and bladder are unremarkable. Stomach/Bowel: No bowel obstruction or ileus. Normal appendix right lower quadrant. Diverticulosis of the sigmoid colon without diverticulitis. Moderate retained stool throughout the colon. Vascular/Lymphatic: Aortic atherosclerosis. No enlarged abdominal or  pelvic lymph nodes. Reproductive: Status post hysterectomy. No adnexal masses. Other: Trace free fluid within the bilateral flanks. No free intraperitoneal gas. No abdominal wall hernia. Musculoskeletal: No acute or destructive bony lesions. Reconstructed images demonstrate no additional findings. IMPRESSION: 1. No urinary tract calculi or obstructive uropathy. 2. Moderate fecal retention. 3. Trace free fluid within the bilateral flanks, nonspecific. 4. Sigmoid diverticulosis without diverticulitis. 5.  Aortic Atherosclerosis (ICD10-I70.0). Electronically Signed   By: Randa Ngo M.D.   On: 06/04/2020 18:15   DG ABD ACUTE 2+V W 1V CHEST  Result Date: 06/04/2020 CLINICAL DATA:  Pain, vomiting. EXAM: DG ABDOMEN ACUTE WITH 1 VIEW CHEST COMPARISON:  Chest x-rays dated 04/26/2020, 04/17/2020 and 01/12/2016. FINDINGS: Single-view of the chest: Stable cardiomegaly. Central pulmonary vascular congestion and mild bilateral interstitial prominence, presumably interstitial edema. No confluent opacity to suggest a consolidating pneumonia. No pleural effusion or pneumothorax is seen. Supine and upright views of the abdomen: No dilated large or small bowel loops. No evidence of free intraperitoneal air. No evidence of renal or ureteral calculi. No acute appearing osseous abnormality. Degenerative spondylosis of lumbosacral spine, with overlying fixation hardware in place. IMPRESSION: 1. Cardiomegaly with central pulmonary vascular congestion and mild bilateral interstitial edema, compatible with CHF/volume overload, likely chronic. 2. Nonobstructive bowel gas pattern and no evidence of acute intra-abdominal abnormality. Electronically Signed   By: Franki Cabot M.D.   On: 06/04/2020 14:28    Scheduled Meds: . polyethylene glycol  17 g Oral Daily  . senna-docusate  1 tablet Oral BID   Continuous Infusions:   LOS: 0 days    Time spent: 70mn  PDomenic Polite MD [facility]  06/05/2020, 11:08 AM

## 2020-06-06 MED ORDER — WHITE PETROLATUM EX OINT
TOPICAL_OINTMENT | CUTANEOUS | Status: AC
  Filled 2020-06-06: qty 28.35

## 2020-06-06 NOTE — Progress Notes (Addendum)
Manufacturing engineer [facility]) Hospital Liaison note.    [facility] is unable to offer a room today. Hospital Liaison will follow up tomorrow or sooner if a room becomes available. Hospice IPU eligibility is confirmed.   Please do not hesitate to call with questions.    Thank you,   Farrel Gordon, RN, [place] (listed on [facility] under [place])    [phone]

## 2020-06-06 NOTE — Plan of Care (Signed)

## 2020-06-06 NOTE — Progress Notes (Signed)
PROGRESS NOTE    Tiffany [name]  NZV:[id number] DOB: 11-08-1950 DOA: 06/04/2020 PCP: Nolene Ebbs, MD  Brief Narrative: 70 year old female with history of type 2 diabetes mellitus, hypertension, OSA, chronic diastolic CHF and CKD stage V who has refused hemodialysis and has met with palliative care at home presented to the ED with abdominal pain and multiple episodes of nausea and vomiting.  Also complains of constipation.  Labs in the ED were notable for creatinine of 9.4 and a BUN of 200, CT abdomen did not note any obstruction but showed constipation -Started on laxatives, patient declines hemodialysis -Now plan for residential hospice  Assessment & Plan:   End-stage renal disease Uremia -she still makes some urine and surprisingly is awake alert and appropriate despite her BUN -See discussion above, she has reiterated on numerous occasions regarding her wishes not to pursue HD -No further nausea and vomiting at this time, tolerating diet, will likely need as needed Phenergan at discharge -[facility] consulted for residential hospice  Goals of care -Patient wants comfort focused care, she understands the consequences of not pursuing hemodialysis -Patient's brother and sister at bedside also understand her clearly stated wishes for very long time -Continue Roxanol and Ativan as needed -Continue laxatives and suppository -Lives alone, unable to care for self, [facility] consult for residential hospice, [facility]  Constipation -Continue laxatives  Diabetes mellitus type 2 Anemia of chronic disease OSA Chronic diastolic CHF  DVT prophylaxis: None Code Status: DNR Family Communication: Sister and brother at bedside yesterday, brother today Disposition Plan:  Status is: inpatient : Inpatient level of care appropriate due to severity of illness  Dispo: The patient is from: Home              Anticipated d/c is to: Residential hospice              Patient currently is  medically stable to d/c.   Difficult to place patient No    Procedures:   Antimicrobials:    Subjective: -No bowel movement yet, will try suppository today with laxatives, denies shortness of breath, no nausea or vomiting  Objective: Vitals:   06/05/20 0454 06/05/20 1136 06/05/20 2055 06/06/20 1012  BP: (!) 123/59 (!) 128/59 (!) 118/59 (!) 119/48  Pulse: 68 69 60 64  Resp: 18 18 18 18   Temp: 98.3 F (36.8 C) 98.5 F (36.9 C) 98.4 F (36.9 C) 99 F (37.2 C)  TempSrc: Oral Oral Oral Oral  SpO2: 100% 100% 90% 98%  Weight:      Height:        Intake/Output Summary (Last 24 hours) at 06/06/2020 1505 Last data filed at 06/06/2020 1300 Gross per 24 hour  Intake 560 ml  Output --  Net 560 ml   Filed Weights   06/04/20 2048  Weight: 93.9 kg    Examination:  General exam: Pleasant chronically ill female laying in bed, awake alert oriented x3, no distress CVS: S1-S2, regular rate rhythm Lungs: Decreased breath sounds the bases otherwise clear Abdomen: Soft, obese, nontender, nondistended, bowel sounds present Extremities: Trace edema Skin: No rashes on exposed skin  Data Reviewed:   CBC: Recent Labs  Lab 06/04/20 1311 06/04/20 1327  WBC 5.5  --   HGB 9.5* 10.5*  HCT 30.4* 31.0*  MCV 85.4  --   PLT 95*  --    Basic Metabolic Panel: Recent Labs  Lab 06/04/20 1311 06/04/20 1327  NA 137 137  K 3.4* 3.5  CL  95* 97*  CO2 22  --   GLUCOSE 135* 135*  BUN 199* >130*  CREATININE 9.36* 9.80*  CALCIUM 9.4  --    GFR: Estimated Creatinine Clearance: 6.7 mL/min (A) (by C-G formula based on SCr of 9.8 mg/dL (H)). Liver Function Tests: Recent Labs  Lab 06/04/20 1311  AST 17  ALT 12  ALKPHOS 80  BILITOT 0.6  PROT 7.0  ALBUMIN 3.6   Recent Labs  Lab 06/04/20 1311  LIPASE 118*   No results for input(s): AMMONIA in the last 168 hours. Coagulation Profile: No results for input(s): INR, PROTIME in the last 168 hours. Cardiac Enzymes: No results for  input(s): CKTOTAL, CKMB, CKMBINDEX, TROPONINI in the last 168 hours. BNP (last 3 results) Recent Labs    01/25/20 1506  PROBNP 2,507*   HbA1C: No results for input(s): HGBA1C in the last 72 hours. CBG: No results for input(s): GLUCAP in the last 168 hours. Lipid Profile: No results for input(s): CHOL, HDL, LDLCALC, TRIG, CHOLHDL, LDLDIRECT in the last 72 hours. Thyroid Function Tests: No results for input(s): TSH, T4TOTAL, FREET4, T3FREE, THYROIDAB in the last 72 hours. Anemia Panel: No results for input(s): VITAMINB12, FOLATE, FERRITIN, TIBC, IRON, RETICCTPCT in the last 72 hours. Urine analysis:    Component Value Date/Time   COLORURINE YELLOW 04/17/2020 [place] 04/17/2020 1222   LABSPEC 1.013 04/17/2020 1222   PHURINE 5.0 04/17/2020 [place] 04/17/2020 1222   HGBUR NEGATIVE 04/17/2020 1222   [place] 04/17/2020 1222   KETONESUR NEGATIVE 04/17/2020 1222   PROTEINUR 100 (A) 04/17/2020 1222   UROBILINOGEN 0.2 02/25/2019 1939   NITRITE NEGATIVE 04/17/2020 1222   LEUKOCYTESUR NEGATIVE 04/17/2020 1222   Sepsis Labs: @LABRCNTIP (procalcitonin:4,lacticidven:4)  ) Recent Results (from the past 240 hour(s))  SARS CORONAVIRUS 2 (TAT 6-24 HRS) Nasopharyngeal Nasopharyngeal Swab     Status: None   Collection Time: 06/04/20  7:21 PM   Specimen: Nasopharyngeal Swab  Result Value Ref Range Status   SARS Coronavirus 2 NEGATIVE NEGATIVE Final    Comment: (NOTE) SARS-CoV-2 target nucleic acids are NOT DETECTED.  The SARS-CoV-2 RNA is generally detectable in upper and lower respiratory specimens during the acute phase of infection. Negative results do not preclude SARS-CoV-2 infection, do not rule out co-infections with other pathogens, and should not be used as the sole basis for treatment or other patient management decisions. Negative results must be combined with clinical observations, patient history, and epidemiological  information. The expected result is Negative.  Fact Sheet for Patients: SugarRoll.be  Fact Sheet for Healthcare Providers: https://www.woods-mathews.com/  This test is not yet approved or cleared by the [place] FDA and  has been authorized for detection and/or diagnosis of SARS-CoV-2 by FDA under an Emergency Use Authorization (EUA). This EUA will remain  in effect (meaning this test can be used) for the duration of the COVID-19 declaration under Se ction 564(b)(1) of the Act, 21 U.S.C. section 360bbb-3(b)(1), unless the authorization is terminated or revoked sooner.  Performed at [facility], [place] [address] [place], [place] [address]          Radiology Studies: CT ABDOMEN PELVIS WO CONTRAST  Result Date: 06/04/2020 CLINICAL DATA:  Nausea and vomiting, constipation for 1 week EXAM: CT ABDOMEN AND PELVIS WITHOUT CONTRAST TECHNIQUE: Multidetector CT imaging of the abdomen and pelvis was performed following the standard protocol without IV contrast. COMPARISON:  06/04/2020 FINDINGS: Lower chest: No acute pleural or parenchymal lung disease. Mild  cardiomegaly with trace pericardial effusion. Unenhanced CT was performed per clinician order. Lack of IV contrast limits sensitivity and specificity, especially for evaluation of abdominal/pelvic solid viscera. Hepatobiliary: Gallbladder is moderately distended without calcified gallstones. No gallbladder wall thickening. Unenhanced imaging of the liver is unremarkable. Pancreas: Unremarkable. No pancreatic ductal dilatation or surrounding inflammatory changes. Spleen: Normal in size without focal abnormality. Adrenals/Urinary Tract: No urinary tract calculi or obstructive uropathy within either kidney. Bilateral renal cortical cysts are noted. The adrenals and bladder are unremarkable. Stomach/Bowel: No bowel obstruction or ileus. Normal appendix right lower quadrant. Diverticulosis of the  sigmoid colon without diverticulitis. Moderate retained stool throughout the colon. Vascular/Lymphatic: Aortic atherosclerosis. No enlarged abdominal or pelvic lymph nodes. Reproductive: Status post hysterectomy. No adnexal masses. Other: Trace free fluid within the bilateral flanks. No free intraperitoneal gas. No abdominal wall hernia. Musculoskeletal: No acute or destructive bony lesions. Reconstructed images demonstrate no additional findings. IMPRESSION: 1. No urinary tract calculi or obstructive uropathy. 2. Moderate fecal retention. 3. Trace free fluid within the bilateral flanks, nonspecific. 4. Sigmoid diverticulosis without diverticulitis. 5.  Aortic Atherosclerosis (ICD10-I70.0). Electronically Signed   By: Randa Ngo M.D.   On: 06/04/2020 18:15    Scheduled Meds: . polyethylene glycol  17 g Oral Daily  . senna-docusate  1 tablet Oral BID   Continuous Infusions:   LOS: 1 day    Time spent:40mn  PDomenic Polite MD [facility]  06/06/2020, 3:05 PM

## 2020-06-07 LAB — RESP PANEL BY RT-PCR (FLU A&B, COVID) ARPGX2
Influenza A by PCR: NEGATIVE
Influenza B by PCR: NEGATIVE
SARS Coronavirus 2 by RT PCR: NEGATIVE

## 2020-06-07 MED ORDER — MORPHINE SULFATE (CONCENTRATE) 10 MG/0.5ML PO SOLN: 5.0000 mg | ORAL | 0 refills | Status: DC | PRN

## 2020-06-07 MED ORDER — HYDROMORPHONE HCL 1 MG/ML IJ SOLN: 0.5000 mg | INTRAMUSCULAR | 0 refills | Status: DC | PRN

## 2020-06-07 MED ORDER — LORAZEPAM 2 MG/ML IJ SOLN: 1.0000 mg | INTRAMUSCULAR | 0 refills | Status: DC | PRN

## 2020-06-07 NOTE — Discharge Summary (Signed)
Physician Discharge Summary  Tiffany [name] VVO:[id number] DOB: 12-11-1950 DOA: 06/04/2020  PCP: Nolene Ebbs, MD  Admit date: 06/04/2020 Discharge date: 06/07/2020  Admitted From: Home Disposition: Residential hospice facility [facility]   Recommendations for Outpatient Follow-up:  None Home Health:no Equipment/Devices:None  Discharge Condition:Hospice CODE STATUS:DNR Diet recommendation: Heart Healthy  Brief/Interim Summary: 70 y.o. female past medical history of diabetes mellitus, essential hypertension obstructive sleep apnea, chronic diastolic heart failure, chronic kidney disease stage V refused hemodialysis comes into the ED complaining of abdominal pain and multiple episodes of nausea and vomiting.  In the ED he was noted to have a creatinine of 9.4 BUN of 200, CT scan of the abdomen did not show any obstruction, but it did show constipation started on MiraLAX. Patient reiterated he did not want dialysis  Discharge Diagnoses:  Active Problems:   ESRD (end stage renal disease) ([place]) Uremia due to end-stage renal disease: Having some urine output. She has reiterated multiple occasions that she wishes not to pursue dialysis. No nausea vomiting have resolved, with Phenergan.   She will be discharged to [facility] facility.  Goals of care: Patient wants comfort care focus she understands the consequences of not pursuing dialysis. Family members at bedside and agree with her decision. Continue Roxanol and Ativan. She lives alone and is unable to care for herself, we are pursuing residential hospice. [facility] bed at [facility].   Discharge Instructions  Discharge Instructions    Diet - low sodium heart healthy   Complete by: As directed    Increase activity slowly   Complete by: As directed      Allergies as of 06/07/2020   No Known Allergies     Medication List    STOP taking these medications   allopurinol 100 MG tablet Commonly known as:  ZYLOPRIM   amLODipine 10 MG tablet Commonly known as: NORVASC   aspirin EC 81 MG tablet   B-D INS SYRINGE 0.5CC/30GX1/2" 30G X 1/2" 0.5 ML Misc Generic drug: Insulin Syringe-Needle U-100   calcitRIOL 0.5 MCG capsule Commonly known as: ROCALTROL   carvedilol 6.25 MG tablet Commonly known as: COREG   cloNIDine 0.3 mg/24hr patch Commonly known as: CATAPRES - Dosed in mg/24 hr   Coenzyme Q10 100 MG capsule   hydrALAZINE 50 MG tablet Commonly known as: APRESOLINE   isosorbide mononitrate 60 MG 24 hr tablet Commonly known as: IMDUR   levothyroxine 200 MCG tablet Commonly known as: SYNTHROID   metolazone 5 MG tablet Commonly known as: ZAROXOLYN   polyethylene glycol 17 g packet Commonly known as: MIRALAX / GLYCOLAX   rosuvastatin 10 MG tablet Commonly known as: CRESTOR   Systane Complete 0.6 % Soln Generic drug: Propylene Glycol   torsemide 100 MG tablet Commonly known as: DEMADEX   zolpidem 5 MG tablet Commonly known as: AMBIEN     TAKE these medications   HYDROmorphone 1 MG/ML injection Commonly known as: DILAUDID Inject 0.5-1 mLs (0.5-1 mg total) into the vein every 2 (two) hours as needed for severe pain.   LORazepam 2 MG/ML injection Commonly known as: ATIVAN Inject 0.5 mLs (1 mg total) into the vein every 4 (four) hours as needed for anxiety.   morphine CONCENTRATE 10 MG/0.5ML Soln concentrated solution Take 0.25 mLs (5 mg total) by mouth every 2 (two) hours as needed for moderate pain (or dyspnea).       No Known Allergies  Consultations:  Nephrology  Hospice and palliative care   Procedures/Studies: CT ABDOMEN  PELVIS WO CONTRAST  Result Date: 06/04/2020 CLINICAL DATA:  Nausea and vomiting, constipation for 1 week EXAM: CT ABDOMEN AND PELVIS WITHOUT CONTRAST TECHNIQUE: Multidetector CT imaging of the abdomen and pelvis was performed following the standard protocol without IV contrast. COMPARISON:  06/04/2020 FINDINGS: Lower chest: No acute  pleural or parenchymal lung disease. Mild cardiomegaly with trace pericardial effusion. Unenhanced CT was performed per clinician order. Lack of IV contrast limits sensitivity and specificity, especially for evaluation of abdominal/pelvic solid viscera. Hepatobiliary: Gallbladder is moderately distended without calcified gallstones. No gallbladder wall thickening. Unenhanced imaging of the liver is unremarkable. Pancreas: Unremarkable. No pancreatic ductal dilatation or surrounding inflammatory changes. Spleen: Normal in size without focal abnormality. Adrenals/Urinary Tract: No urinary tract calculi or obstructive uropathy within either kidney. Bilateral renal cortical cysts are noted. The adrenals and bladder are unremarkable. Stomach/Bowel: No bowel obstruction or ileus. Normal appendix right lower quadrant. Diverticulosis of the sigmoid colon without diverticulitis. Moderate retained stool throughout the colon. Vascular/Lymphatic: Aortic atherosclerosis. No enlarged abdominal or pelvic lymph nodes. Reproductive: Status post hysterectomy. No adnexal masses. Other: Trace free fluid within the bilateral flanks. No free intraperitoneal gas. No abdominal wall hernia. Musculoskeletal: No acute or destructive bony lesions. Reconstructed images demonstrate no additional findings. IMPRESSION: 1. No urinary tract calculi or obstructive uropathy. 2. Moderate fecal retention. 3. Trace free fluid within the bilateral flanks, nonspecific. 4. Sigmoid diverticulosis without diverticulitis. 5.  Aortic Atherosclerosis (ICD10-I70.0). Electronically Signed   By: Randa Ngo M.D.   On: 06/04/2020 18:15   DG ABD ACUTE 2+V W 1V CHEST  Result Date: 06/04/2020 CLINICAL DATA:  Pain, vomiting. EXAM: DG ABDOMEN ACUTE WITH 1 VIEW CHEST COMPARISON:  Chest x-rays dated 04/26/2020, 04/17/2020 and 01/12/2016. FINDINGS: Single-view of the chest: Stable cardiomegaly. Central pulmonary vascular congestion and mild bilateral interstitial  prominence, presumably interstitial edema. No confluent opacity to suggest a consolidating pneumonia. No pleural effusion or pneumothorax is seen. Supine and upright views of the abdomen: No dilated large or small bowel loops. No evidence of free intraperitoneal air. No evidence of renal or ureteral calculi. No acute appearing osseous abnormality. Degenerative spondylosis of lumbosacral spine, with overlying fixation hardware in place. IMPRESSION: 1. Cardiomegaly with central pulmonary vascular congestion and mild bilateral interstitial edema, compatible with CHF/volume overload, likely chronic. 2. Nonobstructive bowel gas pattern and no evidence of acute intra-abdominal abnormality. Electronically Signed   By: Franki Cabot M.D.   On: 06/04/2020 14:28    (Echo, Carotid, EGD, Colonoscopy, ERCP)    Subjective: No complaints feels great.  Discharge Exam: Vitals:   06/06/20 1012 06/07/20 0907  BP: (!) 119/48 (!) 114/50  Pulse: 64 60  Resp: 18 16  Temp: 99 F (37.2 C) 98.8 F (37.1 C)  SpO2: 98% 98%   Vitals:   06/05/20 1136 06/05/20 2055 06/06/20 1012 06/07/20 0907  BP: (!) 128/59 (!) 118/59 (!) 119/48 (!) 114/50  Pulse: 69 60 64 60  Resp: 18 18 18 16   Temp: 98.5 F (36.9 C) 98.4 F (36.9 C) 99 F (37.2 C) 98.8 F (37.1 C)  TempSrc: Oral Oral Oral Oral  SpO2: 100% 90% 98% 98%  Weight:      Height:        General: Pt is alert, awake, not in acute distress Cardiovascular: RRR, S1/S2 +, no rubs, no gallops Respiratory: CTA bilaterally, no wheezing, no rhonchi Abdominal: Soft, NT, ND, bowel sounds + Extremities: no edema, no cyanosis    The results of significant diagnostics from this hospitalization (including  imaging, microbiology, ancillary and laboratory) are listed below for reference.     Microbiology: Recent Results (from the past 240 hour(s))  SARS CORONAVIRUS 2 (TAT 6-24 HRS) Nasopharyngeal Nasopharyngeal Swab     Status: None   Collection Time: 06/04/20  7:21 PM    Specimen: Nasopharyngeal Swab  Result Value Ref Range Status   SARS Coronavirus 2 NEGATIVE NEGATIVE Final    Comment: (NOTE) SARS-CoV-2 target nucleic acids are NOT DETECTED.  The SARS-CoV-2 RNA is generally detectable in upper and lower respiratory specimens during the acute phase of infection. Negative results do not preclude SARS-CoV-2 infection, do not rule out co-infections with other pathogens, and should not be used as the sole basis for treatment or other patient management decisions. Negative results must be combined with clinical observations, patient history, and epidemiological information. The expected result is Negative.  Fact Sheet for Patients: SugarRoll.be  Fact Sheet for Healthcare Providers: https://www.woods-mathews.com/  This test is not yet approved or cleared by the [place] FDA and  has been authorized for detection and/or diagnosis of SARS-CoV-2 by FDA under an Emergency Use Authorization (EUA). This EUA will remain  in effect (meaning this test can be used) for the duration of the COVID-19 declaration under Se ction 564(b)(1) of the Act, 21 U.S.C. section 360bbb-3(b)(1), unless the authorization is terminated or revoked sooner.  Performed at [facility], [place] [address], [place], [place] [address]      Labs: BNP (last 3 results) Recent Labs    02/01/20 1231 04/17/20 0907 04/26/20 1746  BNP 295.3* 508.0* 106.2*   Basic Metabolic Panel: Recent Labs  Lab 06/04/20 1311 06/04/20 1327  NA 137 137  K 3.4* 3.5  CL 95* 97*  CO2 22  --   GLUCOSE 135* 135*  BUN 199* >130*  CREATININE 9.36* 9.80*  CALCIUM 9.4  --    Liver Function Tests: Recent Labs  Lab 06/04/20 1311  AST 17  ALT 12  ALKPHOS 80  BILITOT 0.6  PROT 7.0  ALBUMIN 3.6   Recent Labs  Lab 06/04/20 1311  LIPASE 118*   No results for input(s): AMMONIA in the last 168 hours. CBC: Recent Labs  Lab 06/04/20 1311  06/04/20 1327  WBC 5.5  --   HGB 9.5* 10.5*  HCT 30.4* 31.0*  MCV 85.4  --   PLT 95*  --    Cardiac Enzymes: No results for input(s): CKTOTAL, CKMB, CKMBINDEX, TROPONINI in the last 168 hours. BNP: Invalid input(s): POCBNP CBG: No results for input(s): GLUCAP in the last 168 hours. D-Dimer No results for input(s): DDIMER in the last 72 hours. Hgb A1c No results for input(s): HGBA1C in the last 72 hours. Lipid Profile No results for input(s): CHOL, HDL, LDLCALC, TRIG, CHOLHDL, LDLDIRECT in the last 72 hours. Thyroid function studies No results for input(s): TSH, T4TOTAL, T3FREE, THYROIDAB in the last 72 hours.  Invalid input(s): FREET3 Anemia work up No results for input(s): VITAMINB12, FOLATE, FERRITIN, TIBC, IRON, RETICCTPCT in the last 72 hours. Urinalysis    Component Value Date/Time   COLORURINE YELLOW 04/17/2020 1222   APPEARANCEUR CLEAR 04/17/2020 1222   LABSPEC 1.013 04/17/2020 1222   PHURINE 5.0 04/17/2020 [place] 04/17/2020 1222   HGBUR NEGATIVE 04/17/2020 1222   [place] 04/17/2020 [place] 04/17/2020 1222   PROTEINUR 100 (A) 04/17/2020 1222   UROBILINOGEN 0.2 02/25/2019 1939   NITRITE NEGATIVE 04/17/2020 [place] 04/17/2020 1222  Sepsis Labs Invalid input(s): PROCALCITONIN,  WBC,  LACTICIDVEN Microbiology Recent Results (from the past 240 hour(s))  SARS CORONAVIRUS 2 (TAT 6-24 HRS) Nasopharyngeal Nasopharyngeal Swab     Status: None   Collection Time: 06/04/20  7:21 PM   Specimen: Nasopharyngeal Swab  Result Value Ref Range Status   SARS Coronavirus 2 NEGATIVE NEGATIVE Final    Comment: (NOTE) SARS-CoV-2 target nucleic acids are NOT DETECTED.  The SARS-CoV-2 RNA is generally detectable in upper and lower respiratory specimens during the acute phase of infection. Negative results do not preclude SARS-CoV-2 infection, do not rule out co-infections with other pathogens, and should not  be used as the sole basis for treatment or other patient management decisions. Negative results must be combined with clinical observations, patient history, and epidemiological information. The expected result is Negative.  Fact Sheet for Patients: SugarRoll.be  Fact Sheet for Healthcare Providers: https://www.woods-mathews.com/  This test is not yet approved or cleared by the [place] FDA and  has been authorized for detection and/or diagnosis of SARS-CoV-2 by FDA under an Emergency Use Authorization (EUA). This EUA will remain  in effect (meaning this test can be used) for the duration of the COVID-19 declaration under Se ction 564(b)(1) of the Act, 21 U.S.C. section 360bbb-3(b)(1), unless the authorization is terminated or revoked sooner.  Performed at [facility], [place] [address], [place], [place] [address]      Time coordinating discharge: Over 30 minutes  SIGNED:   Charlynne Cousins, MD  [facility] 06/07/2020, 11:15 AM Pager   If 7PM-7AM, please contact night-coverage [email or web address] Password [email or web address]

## 2020-06-07 NOTE — TOC Transition Note (Signed)
Transition of Care [facility]) - CM/SW Discharge Note *Discharged to [facility]   Patient Details  Name: Tiffany [name] MRN: [id number] Date of Birth: 16-Dec-1950  Transition of Care [facility]) CM/SW Contact:  Sable Feil, LCSW Phone Number: 06/07/2020, 1:04 PM   Clinical Narrative:  [facility]) has a bed for patient today. MD aware and discharge paperwork completed. COVID test negative. Visited with patient to inform her that transport arranged and she requested that Jethro Bastos be contacted: [phone]. Nurse provided with information to call report.       Final next level of care: [place] ([place]) Barriers to Discharge: Barriers Resolved   Patient Goals and CMS Choice Patient states their goals for this hospitalization and ongoing recovery are:: Residential hospice CMS Medicare.gov Compare Post Acute Care list provided to:: Other (Comment Required) (not needed - patient chose [facility]) Choice offered to / list presented to : Patient  Discharge Placement              Patient chooses bed at: Other - please specify in the comment section below: ([place]) Patient to be transferred to facility by: Non-emergency ambulance transport Name of family member notified: Jethro Bastos - [phone] Patient and family notified of of transfer: 06/07/20  Discharge Plan and Services In-house Referral: Hospice / [facility] Work Discharge Planning Services: CM Consult Post Acute Care Choice: Hospice          DME Arranged: N/A DME Agency: NA       HH Arranged: NA HH Agency: NA        Social Determinants of Health (SDOH) Interventions  No SDOH interventions requested or needed at discharge.   Readmission Risk Interventions Readmission Risk Prevention Plan 05/02/2020  Transportation Screening Complete  PCP or Specialist Appt within 3-5 Days Complete  Social Work Consult for [place]  Planning/Counseling Complete  Palliative Care Screening Not Applicable  Medication Review Press photographer) Complete  Some recent data might be hidden

## 2020-06-07 NOTE — Progress Notes (Signed)
DISCHARGE NOTE SNF Tiffany [name] to be discharged [facility]  per MD order. Patient verbalized understanding.  Skin clean, dry and intact without evidence of skin break down, no evidence of skin tears noted. IV catheter discontinued intact. Site without signs and symptoms of complications. Dressing and pressure applied. Pt denies pain at the site currently. No complaints noted.  Patient free of lines, drains, and wounds.   Discharge packet assembled. An After Visit Summary (AVS) was printed and given to the EMS personnel. Patient escorted via stretcher and discharged to [facility] via ambulance. Report called to accepting facility; all questions and concerns addressed.   Paulla Fore, RN, BSN

## 2020-06-07 NOTE — Progress Notes (Signed)
Called report to [employer] and spoke to [place], Therapist, sports.   Paulla Fore, RN, BSN

## 2020-06-07 NOTE — Progress Notes (Signed)
[facility] ([facility]) Hospital Liaison note.     This patient is approved to transfer to [facility] today.   [facility] will notify TOC when registration paperwork has been completed to arrange transport.    RN please call report to [phone].   Thank you,     Mary Anne Robertson, RN, CCM       [facility] Hospital Liaison (listed on AMION under Hospice/[facility])     [phone] 

## 2020-06-07 NOTE — Progress Notes (Incomplete)
[facility] PROGRESS NOTE    Progress Note  Tiffany [name]  GBT:[id number] DOB: May 23, 1950 DOA: 06/04/2020 PCP: Nolene Ebbs, MD     Brief Narrative:   Tiffany [name] is [name] 70 y.o. female past medical history of diabetes mellitus, essential hypertension obstructive sleep apnea, chronic diastolic heart failure, chronic kidney disease stage V refused hemodialysis comes into the ED complaining of abdominal pain and multiple episodes of nausea and vomiting.  In the ED he was noted to have a creatinine of 9.4 BUN of 200, CT scan of the abdomen did not show any obstruction, but it did show constipation started on MiraLAX. Patient reiterated he did not want dialysis  Awaiting residential hospice facility placement.  Assessment/Plan:   Uremia due to end-stage renal disease: Having some urine output. She has reiterated multiple occasions that she wishes not to pursue dialysis. No nausea vomiting have resolved, with Phenergan.  Residential hospice will need to follow-up as [name] outpatient.  Goals of care: Patient wants comfort care focus she understands the consequences of not pursuing dialysis. Family members at bedside and agree with her decision. Continue Roxanol and Ativan. She lives alone and is unable to care for herself, we are pursuing residential hospice. [facility] bed at [facility].    DVT prophylaxis: none Family Communication:brother and sister Status is: Inpatient  Remains inpatient appropriate because:Hemodynamically unstable   Dispo: The patient is from: Home              Anticipated d/c is to: Residential hospice facility              Patient currently is not medically stable to d/c.   Difficult to place patient No        Code Status:     Code Status Orders  (From admission, onward)         Start     Ordered   06/04/20 1858  Do not attempt resuscitation (DNR)  Continuous       Question Answer Comment  In the event of cardiac or respiratory ARREST Do  not call a "code blue"   In the event of cardiac or respiratory ARREST Do not perform Intubation, CPR, defibrillation or ACLS   In the event of cardiac or respiratory ARREST Use medication by any route, position, wound care, and other measures to relive pain and suffering. May use oxygen, suction and manual treatment of airway obstruction as needed for comfort.   Comments DNI, confirmed with patient      06/04/20 1902        Code Status History    Date Active Date Inactive Code Status Order ID Comments User Context   06/04/2020 1902 06/04/2020 1902 DNR [id number]  Vashti Hey, MD ED   04/26/2020 2131 05/02/2020 1901 DNR [id number]  Ileene Musa T, DO ED   04/26/2020 2128 04/26/2020 2130 DNR [id number]  Orene Desanctis, DO ED   02/01/2020 1736 02/04/2020 1910 Partial Code [id number]  Jonnie Finner, DO Inpatient   07/01/2019 1124 07/02/2019 2138 Full Code [id number]  Maurice March, PA-C Inpatient   Advance Care Planning Activity        IV Access:    Peripheral IV   Procedures and diagnostic studies:   No results found.   Medical Consultants:    None.   Subjective:    Raschelle Wisenbaker   Objective:    Vitals:   06/05/20 1136 06/05/20 2055 06/06/20 1012 06/07/20 0907  BP: (!) 128/59 (!) 118/59 [place] Kitchen)  119/48 (!) 114/50  Pulse: 69 60 64 60  Resp: 18 18 18 16   Temp: 98.5 F (36.9 C) 98.4 F (36.9 C) 99 F (37.2 C) 98.8 F (37.1 C)  TempSrc: Oral Oral Oral Oral  SpO2: 100% 90% 98% 98%  Weight:      Height:       SpO2: 98 %   Intake/Output Summary (Last 24 hours) at 06/07/2020 1056 Last data filed at 06/06/2020 1700 Gross per 24 hour  Intake 240 ml  Output -  Net 240 ml   Filed Weights   06/04/20 2048  Weight: 93.9 kg    Exam: General exam: In no acute distress. Respiratory system: Good air movement and clear to auscultation. Cardiovascular system: S1 & S2 heard, RRR. No JVD, murmurs, rubs, gallops or clicks.  Gastrointestinal system: Abdomen is  nondistended, soft and nontender.  Central nervous system: Alert and oriented. No focal neurological deficits. Extremities: No pedal edema. Skin: No rashes, lesions or ulcers Psychiatry: Judgement and insight appear normal. Mood & affect appropriate.    Data Reviewed:    Labs: Basic Metabolic Panel: Recent Labs  Lab 06/04/20 1311 06/04/20 1327  NA 137 137  K 3.4* 3.5  CL 95* 97*  CO2 22  --   GLUCOSE 135* 135*  BUN 199* >130*  CREATININE 9.36* 9.80*  CALCIUM 9.4  --    GFR Estimated Creatinine Clearance: 6.7 mL/min (A) (by C-G formula based on SCr of 9.8 mg/dL (H)). Liver Function Tests: Recent Labs  Lab 06/04/20 1311  AST 17  ALT 12  ALKPHOS 80  BILITOT 0.6  PROT 7.0  ALBUMIN 3.6   Recent Labs  Lab 06/04/20 1311  LIPASE 118*   No results for input(s): AMMONIA in the last 168 hours. Coagulation profile No results for input(s): INR, PROTIME in the last 168 hours. COVID-19 Labs  No results for input(s): DDIMER, FERRITIN, LDH, CRP in the last 72 hours.  Lab Results  Component Value Date   SARSCOV2NAA NEGATIVE 06/04/2020   SARSCOV2NAA NEGATIVE 04/26/2020   [place] NEGATIVE 04/17/2020   [place] NEGATIVE 02/01/2020    CBC: Recent Labs  Lab 06/04/20 1311 06/04/20 1327  WBC 5.5  --   HGB 9.5* 10.5*  HCT 30.4* 31.0*  MCV 85.4  --   PLT 95*  --    Cardiac Enzymes: No results for input(s): CKTOTAL, CKMB, CKMBINDEX, TROPONINI in the last 168 hours. BNP (last 3 results) Recent Labs    01/25/20 1506  PROBNP 2,507*   CBG: No results for input(s): GLUCAP in the last 168 hours. D-Dimer: No results for input(s): DDIMER in the last 72 hours. Hgb A1c: No results for input(s): HGBA1C in the last 72 hours. Lipid Profile: No results for input(s): CHOL, HDL, LDLCALC, TRIG, CHOLHDL, LDLDIRECT in the last 72 hours. Thyroid function studies: No results for input(s): TSH, T4TOTAL, T3FREE, THYROIDAB in the last 72 hours.  Invalid input(s):  FREET3 Anemia work up: No results for input(s): VITAMINB12, FOLATE, FERRITIN, TIBC, IRON, RETICCTPCT in the last 72 hours. Sepsis Labs: Recent Labs  Lab 06/04/20 1311  WBC 5.5   Microbiology Recent Results (from the past 240 hour(s))  SARS CORONAVIRUS 2 (TAT 6-24 HRS) Nasopharyngeal Nasopharyngeal Swab     Status: None   Collection Time: 06/04/20  7:21 PM   Specimen: Nasopharyngeal Swab  Result Value Ref Range Status   SARS Coronavirus 2 NEGATIVE NEGATIVE Final    Comment: (NOTE) SARS-CoV-2 target nucleic acids are NOT DETECTED.  The SARS-CoV-2  RNA is generally detectable in upper and lower respiratory specimens during the acute phase of infection. Negative results do not preclude SARS-CoV-2 infection, do not rule out co-infections with other pathogens, and should not be used as the sole basis for treatment or other patient management decisions. Negative results must be combined with clinical observations, patient history, and epidemiological information. The expected result is Negative.  Fact Sheet for Patients: SugarRoll.be  Fact Sheet for Healthcare Providers: https://www.woods-mathews.com/  This test is not yet approved or cleared by the [place] FDA and  has been authorized for detection and/or diagnosis of SARS-CoV-2 by FDA under [name] Emergency Use Authorization (EUA). This EUA will remain  in effect (meaning this test can be used) for the duration of the COVID-19 declaration under Se ction 564(b)(1) of the Act, 21 U.S.C. section 360bbb-3(b)(1), unless the authorization is terminated or revoked sooner.  Performed at [facility], [place] [address] [place], [place] [address]      Medications:   . polyethylene glycol  17 g Oral Daily  . senna-docusate  1 tablet Oral BID   Continuous Infusions:    LOS: 2 days   [facility]  06/07/2020, 10:56 AM

## 2020-07-10 ENCOUNTER — Other Ambulatory Visit: Payer: Self-pay | Admitting: Gastroenterology

## 2020-07-26 ENCOUNTER — Telehealth: Payer: Self-pay | Admitting: Physical Medicine and Rehabilitation

## 2020-07-26 NOTE — Telephone Encounter (Signed)
Pt called stating she would like to set an appt for a back inj; pt states she hasn't had one since July, 2021 but in her chart it looks .  [phone]

## 2020-07-26 NOTE — Telephone Encounter (Signed)
Left L5-S1 IL on 08/17/2019. Ok to repeat if helped, same problem/side, and no new injury?

## 2020-07-26 NOTE — Telephone Encounter (Signed)
Is auth needed? Scheduled for tomorrow 6/23.

## 2020-07-27 ENCOUNTER — Encounter: Payer: Self-pay | Admitting: Physical Medicine and Rehabilitation

## 2020-07-27 ENCOUNTER — Ambulatory Visit: Payer: Self-pay

## 2020-07-27 ENCOUNTER — Other Ambulatory Visit: Payer: Self-pay

## 2020-07-27 ENCOUNTER — Ambulatory Visit (INDEPENDENT_AMBULATORY_CARE_PROVIDER_SITE_OTHER): Payer: Medicare Other | Admitting: Physical Medicine and Rehabilitation

## 2020-07-27 VITALS — BP 155/78 | HR 66

## 2020-07-27 DIAGNOSIS — M5416 Radiculopathy, lumbar region: Secondary | ICD-10-CM

## 2020-07-27 MED ORDER — BETAMETHASONE SOD PHOS & ACET 6 (3-3) MG/ML IJ SUSP
12.0000 mg | Freq: Once | INTRAMUSCULAR | Status: AC
Start: 2020-07-27 — End: 2020-07-27
  Administered 2020-07-27: 12 mg

## 2020-07-27 NOTE — Progress Notes (Signed)
Pt state lower back pain that travels to the buttocks. Pt state walking and sitting makes the pain worse. Pt state she takes over the counter pain meds.  Numeric Pain Rating Scale and Functional Assessment Average Pain 8   In the last MONTH (on 0-10 scale) has pain interfered with the following?  1. General activity like being  able to carry out your everyday physical activities such as walking, climbing stairs, carrying groceries, or moving a chair?  Rating(10)   +Driver, -BT, -Dye Allergies.

## 2020-07-27 NOTE — Progress Notes (Signed)
Tiffany [name] - 70 y.o. female MRN [id number]  Date of birth: Jun 24, 1950  Office Visit Note: Visit Date: 07/27/2020 PCP: Nolene Ebbs, MD Referred by: Nolene Ebbs, MD  Subjective: Chief Complaint  Patient presents with   Lower Back - Pain   HPI:  Tiffany [name] is a 70 y.o. female who comes in today for planned repeat Right L5-S1  Lumbar Interlaminar epidural steroid injection with fluoroscopic guidance.  The patient has failed conservative care including home exercise, medications, time and activity modification.  This injection will be diagnostic and hopefully therapeutic.  Please see requesting physician notes for further details and justification. Patient received more than 50% pain relief from prior injection.  Last injection performed was in July of last year.  She has a history of lumbar instrumented fusion down to L4-5.  She is not fused to the sacrum.  She has had complicated history of congestive heart failure and diabetes with end-stage renal disease.  She gets a lot of her orthopedic care at [facility].  She has no red flag complaints today no new trauma no focal weakness.  I think it is reasonable to repeat the injection.  In the future may look at repeat imaging and patient would be a good candidate for spinal cord stimulator trial should the injections not be very beneficial.  Referring: Dr. Raquel Sarna Dalton-Bethea   ROS Otherwise per HPI.  Assessment & Plan: Visit Diagnoses:    ICD-10-CM   1. Lumbar radiculopathy  M54.16 XR C-ARM NO REPORT    Epidural Steroid injection    betamethasone acetate-betamethasone sodium phosphate (CELESTONE) injection 12 mg      Plan: No additional findings.   Meds & Orders:  Meds ordered this encounter  Medications   betamethasone acetate-betamethasone sodium phosphate (CELESTONE) injection 12 mg    Orders Placed This Encounter  Procedures   XR C-ARM NO REPORT   Epidural Steroid injection    Follow-up: Return if symptoms  worsen or fail to improve.   Procedures: No procedures performed  Lumbar Epidural Steroid Injection - Interlaminar Approach with Fluoroscopic Guidance  Patient: Tiffany [name]      Date of Birth: 03-27-1950 MRN: [id number] PCP: Nolene Ebbs, MD      Visit Date: 07/27/2020   Universal Protocol:     Consent Given By: the patient  Position: PRONE  Additional Comments: Vital signs were monitored before and after the procedure. Patient was prepped and draped in the usual sterile fashion. The correct patient, procedure, and site was verified.   Injection Procedure Details:   Procedure diagnoses: Lumbar radiculopathy [M54.16]   Meds Administered:  Meds ordered this encounter  Medications   betamethasone acetate-betamethasone sodium phosphate (CELESTONE) injection 12 mg     Laterality: Right  Location/Site:  L5-S1  Needle: 4.5 in., 20 ga. Tuohy  Needle Placement: Paramedian epidural  Findings:   -Comments: Excellent flow of contrast into the epidural space.  Procedure Details: Using a paramedian approach from the side mentioned above, the region overlying the inferior lamina was localized under fluoroscopic visualization and the soft tissues overlying this structure were infiltrated with 4 ml. of 1% Lidocaine without Epinephrine. The Tuohy needle was inserted into the epidural space using a paramedian approach.   The epidural space was localized using loss of resistance along with counter oblique bi-planar fluoroscopic views.  After negative aspirate for air, blood, and CSF, a 2 ml. volume of Isovue-250 was injected into the epidural space and the flow of contrast was observed.  Radiographs were obtained for documentation purposes.    The injectate was administered into the level noted above.   Additional Comments:  No complications occurred Dressing: 2 x 2 sterile gauze and Band-Aid    Post-procedure details: Patient was observed during the  procedure. Post-procedure instructions were reviewed.  Patient left the clinic in stable condition.   Clinical History: MRI LUMBAR SPINE WITHOUT CONTRAST   TECHNIQUE: Multiplanar, multisequence MR imaging of the lumbar spine was performed. No intravenous contrast was administered.   COMPARISON: 11/05/2012   FINDINGS: Segmentation: 5 lumbar type vertebral bodies assumed.   Alignment: No malalignment.   Vertebrae: No fracture or primary bone lesion.   Conus medullaris and cauda equina: Conus extends to the L1 level. Conus and cauda equina appear normal.   Paraspinal and other soft tissues: Simple appearing renal cysts.   Disc levels:   Degenerative disc disease at T9-10 and T10-11 with disc bulges but no apparent compressive stenosis. Those levels were not studied in detail.   T12-L1 and L1-2 are normal.   L2-3: Mild bulging of the disc. Facet degeneration with ligamentous hypertrophy and joint effusions. Mild narrowing of the canal but no compressive stenosis in this position. This appearance could worsen with standing or flexion, based on the morphology of the facet arthropathy. This has worsened since 2014.   L3-4: Previous PLIF is solid with wide patency of the canal and foramina.   L4-5: Previous left hemilaminectomy. Endplate osteophytes and mild bulging of the disc. No compressive stenosis.   L5-S1: Central disc protrusion contacts the thecal sac in the S1 root sleeves as they bud from the thecal sac, though definite neural compression is not demonstrated. Foramina appear sufficiently patent. Mild bilateral facet osteoarthritis. Similar appearance to the study of 2014.   IMPRESSION: Since 2014, there has been worsening of adjacent segment degenerative disease at L2-3. The disc bulges mildly. There is bilateral facet arthropathy with ligamentous hypertrophy and fluid-filled joints. There is mild stenosis of the canal in this position, but this appearance  would likely worsen with standing or flexion.   Good appearance at the previous discectomy and fusion level of L3-4.   Satisfactory appearance at the previous surgical level of L4-5 with previous left hemilaminectomy. Mild degenerative changes without compressive stenosis.   L5-S1 shows a chronic central disc protrusion and mild facet degeneration but no apparent compressive stenosis or change since,2014.     Electronically Signed By: Nelson Chimes M.D. On: 09/28/2017 07:00     Objective:  VS:  HT:    WT:   BMI:     BP:(!) 155/78  HR:66bpm  TEMP: ( )  RESP:  Physical Exam   Imaging: No results found.

## 2020-07-27 NOTE — Patient Instructions (Signed)

## 2020-07-30 NOTE — Procedures (Signed)
Lumbar Epidural Steroid Injection - Interlaminar Approach with Fluoroscopic Guidance  Patient: Tiffany [name]      Date of Birth: Jul 16, 1950 MRN: [id number] PCP: Nolene Ebbs, MD      Visit Date: 07/27/2020   Universal Protocol:     Consent Given By: the patient  Position: PRONE  Additional Comments: Vital signs were monitored before and after the procedure. Patient was prepped and draped in the usual sterile fashion. The correct patient, procedure, and site was verified.   Injection Procedure Details:   Procedure diagnoses: Lumbar radiculopathy [M54.16]   Meds Administered:  Meds ordered this encounter  Medications   betamethasone acetate-betamethasone sodium phosphate (CELESTONE) injection 12 mg     Laterality: Right  Location/Site:  L5-S1  Needle: 4.5 in., 20 ga. Tuohy  Needle Placement: Paramedian epidural  Findings:   -Comments: Excellent flow of contrast into the epidural space.  Procedure Details: Using a paramedian approach from the side mentioned above, the region overlying the inferior lamina was localized under fluoroscopic visualization and the soft tissues overlying this structure were infiltrated with 4 ml. of 1% Lidocaine without Epinephrine. The Tuohy needle was inserted into the epidural space using a paramedian approach.   The epidural space was localized using loss of resistance along with counter oblique bi-planar fluoroscopic views.  After negative aspirate for air, blood, and CSF, a 2 ml. volume of Isovue-250 was injected into the epidural space and the flow of contrast was observed. Radiographs were obtained for documentation purposes.    The injectate was administered into the level noted above.   Additional Comments:  No complications occurred Dressing: 2 x 2 sterile gauze and Band-Aid    Post-procedure details: Patient was observed during the procedure. Post-procedure instructions were reviewed.  Patient left the clinic in stable  condition.

## 2020-08-28 ENCOUNTER — Telehealth: Payer: Self-pay

## 2020-08-28 NOTE — Telephone Encounter (Signed)
Pt would like to set up an appt with Dr. Ernestina Patches

## 2020-08-29 ENCOUNTER — Telehealth: Payer: Self-pay | Admitting: Physical Medicine and Rehabilitation

## 2020-08-29 NOTE — Telephone Encounter (Signed)
Pt called requesting to reschedule appt. Please call pt about this matter at [phone].

## 2020-08-30 ENCOUNTER — Telehealth: Payer: Self-pay | Admitting: Physical Medicine and Rehabilitation

## 2020-08-30 ENCOUNTER — Ambulatory Visit: Payer: Medicare Other | Admitting: Physical Medicine and Rehabilitation

## 2020-08-30 NOTE — Telephone Encounter (Signed)
Pt returning call to [facility]. Please call pt at [phone].

## 2020-08-30 NOTE — Telephone Encounter (Signed)
Appointment cancelled. Patient sent message in [place] stating that she will call us to reschedule.

## 2020-09-01 ENCOUNTER — Other Ambulatory Visit: Payer: Self-pay

## 2020-09-01 ENCOUNTER — Ambulatory Visit (HOSPITAL_COMMUNITY)
Admission: EM | Admit: 2020-09-01 | Discharge: 2020-09-01 | Disposition: A | Discharge status: Home / Self Care | Payer: Medicare Other

## 2020-09-01 NOTE — ED Notes (Signed)
Pt was in wrong location; pt needed to go to PCP to receive referral for iron infusion and possible dialysis.

## 2020-09-19 ENCOUNTER — Encounter (HOSPITAL_COMMUNITY)
Admission: RE | Admit: 2020-09-19 | Discharge: 2020-09-19 | Disposition: A | Discharge status: Home / Self Care | Payer: Medicare Other | Source: Ambulatory Visit | Attending: Internal Medicine | Admitting: Internal Medicine

## 2020-09-19 VITALS — BP 145/72 | HR 62 | Temp 98.1°F | Resp 18

## 2020-09-19 DIAGNOSIS — N185 Chronic kidney disease, stage 5: Secondary | ICD-10-CM | POA: Diagnosis present

## 2020-09-19 LAB — RENAL FUNCTION PANEL
Albumin: 3.6 g/dL (ref 3.5–5.0)
Anion gap: 11 (ref 5–15)
BUN: 94 mg/dL — ABNORMAL HIGH (ref 8–23)
CO2: 20 mmol/L — ABNORMAL LOW (ref 22–32)
Calcium: 9.4 mg/dL (ref 8.9–10.3)
Chloride: 109 mmol/L (ref 98–111)
Creatinine, Ser: 3.87 mg/dL — ABNORMAL HIGH (ref 0.44–1.00)
GFR, Estimated: 12 mL/min — ABNORMAL LOW (ref >60)
Glucose, Bld: 152 mg/dL — ABNORMAL HIGH (ref 70–99)
Phosphorus: 4.9 mg/dL — ABNORMAL HIGH (ref 2.5–4.6)
Potassium: 4.4 mmol/L (ref 3.5–5.1)
Sodium: 140 mmol/L (ref 135–145)

## 2020-09-19 LAB — IRON AND TIBC
Iron: 77 ug/dL (ref 28–170)
Saturation Ratios: 27 % (ref 10.4–31.8)
TIBC: 281 ug/dL (ref 250–450)
UIBC: 204 ug/dL

## 2020-09-19 LAB — FERRITIN: Ferritin: 616 ng/mL — ABNORMAL HIGH (ref 11–307)

## 2020-09-19 LAB — POCT HEMOGLOBIN-HEMACUE: Hemoglobin: 7.2 g/dL — ABNORMAL LOW (ref 12.0–15.0)

## 2020-09-19 MED ORDER — EPOETIN ALFA-EPBX 40000 UNIT/ML IJ SOLN
40000.0000 [IU] | INTRAMUSCULAR | Status: DC
Start: 2020-09-19 — End: 2020-09-20

## 2020-09-19 MED ORDER — EPOETIN ALFA-EPBX 40000 UNIT/ML IJ SOLN
INTRAMUSCULAR | Status: AC
  Administered 2020-09-19: 40000 [IU] via SUBCUTANEOUS
  Filled 2020-09-19: qty 1

## 2020-09-20 LAB — PTH, INTACT AND CALCIUM
Calcium, Total (PTH): 9.3 mg/dL (ref 8.7–10.3)
PTH: 185 pg/mL — ABNORMAL HIGH (ref 15–65)

## 2020-09-23 NOTE — Progress Notes (Signed)
Subjective:    Patient ID: Tiffany [name], female    DOB: 08-01-1950, 70 y.o.   MRN: [id number]  HPI  female never smoker followed for allergic rhinitis, OSA, complicated by obesity, HTN, hypothyroid, DM2, CKDV, NPSG 04/25/03- AHI 14/ hr, desaturation to 86%, body weight 344 lbs  -----------------------------------------------------------------------------------   09/24/19- 70 year old female never smoker followed for allergic rhinitis, OSA, complicated by obesity, HTN, hypothyroid, DM CPAP 9/ [employer] [facility]- none today Body weight today 254 lbs Had 2 [employer] Covax -----osa, states she is using every night, denies problems,wants to change DME to [facility] kept her machine several weeks to service it, but didn't fix problem. Still doesn't work. Increased DOE in recent months with little cough or wheeze. 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  09/25/20- 70 year old female never smoker followed for allergic rhinitis, OSA, complicated by obesity, HTN, hypothyroid, DM2, dCHF, CKDV(refused hemodialysis), , [facility], -Symbicort 160, Ventolin hfa CPAP 5-15/ [employer]- compliance 83%, AHI 0.5/ hr Body weight today-244 lbs Covid vax-4 [facility] Hosp in May with ESRD- refuses HD and chooses comfort care. Seen at [facility]. Has met Palliative Care. Hospice doctor gave Symbicort- helps a little. -----SOB, upper back pain, having chest pains when walking.  Last saw Cardiology in April  for CHF. Dyspneic walking from lobby but arrival O2 sat 100%. Describes pains mid sternum, bilateral ribs and midback, esp w exertion. Pains can be duplicated by pressure on sternum, forced deep breath, moving arms. She looked it up and feels she has costochondritis, Takes occ Tylenol.  Aware of anemia- last Hgb 7.2 on 8/16. Noted some blood in her mouth on waking this morning, without recurrence.. Normally bruises easily but no other blood or changes noted.  CXR 04/26/20- IMPRESSION: Cardiomegaly, vascular  congestion.  ROS-see HPI + = positive Constitutional:   No-   weight loss, night sweats, fevers, chills, fatigue, lassitude. HEENT:   No-  headaches, difficulty swallowing, tooth/dental problems, sore throat,       No-  sneezing, itching, ear ache, nasal congestion, post nasal drip,  CV:  + chest pain, orthopnea, PND, swelling in lower extremities, anasarca,  dizziness, palpitations Resp: + shortness of breath with exertion or at rest.              No-   productive cough,  No non-productive cough,  No- coughing up of blood.              No-   change in color of mucus.  No- wheezing.   Skin: No-   rash or lesions. GI:  No-   heartburn, indigestion, abdominal pain, nausea, vomiting,  GU:  MS:  No-   joint pain or swelling.   Neuro-     nothing unusual Psych:  No- change in mood or affect. No depression or anxiety.  No memory loss.  OBJ- Physical Exam  General- Alert, Oriented, Affect-appropriate, Distress- none acute, +obese Skin- rash-none, lesions- none, excoriation- none Lymphadenopathy- none Head- atraumatic            Eyes- Gross vision intact, PERRLA, conjunctivae and secretions clear, +strabismus            Ears- Hearing, canals-normal            Nose- Clear, no-Septal dev, mucus, polyps, erosion, perforation             Throat- Mallampati III , mucosa clear , drainage- none, tonsils- atrophic Neck- flexible , trachea midline, no stridor , thyroid nl, carotid no bruit Chest -  symmetrical excursion , unlabored           Heart/CV- RRR ,+ 3-1/5 systolic murmur RUSB , no gallop  , no rub, nl s1 s2                           - JVD- none , edema- none, stasis changes- none, varices- none           Lung- clear to P&A, wheeze- none, cough- none , dullness-none, rub- none           Chest wall- +tender sternal pressure Abd-  Br/ Gen/ Rectal- Not done, not indicated Extrem- cyanosis- none, clubbing, none, atrophy- none, strength- nl Neuro- grossly intact to observation

## 2020-09-25 ENCOUNTER — Encounter: Payer: Self-pay | Admitting: Internal Medicine

## 2020-09-25 ENCOUNTER — Other Ambulatory Visit: Payer: Self-pay

## 2020-09-25 ENCOUNTER — Ambulatory Visit: Payer: Medicare Other | Admitting: Internal Medicine

## 2020-09-25 ENCOUNTER — Ambulatory Visit (INDEPENDENT_AMBULATORY_CARE_PROVIDER_SITE_OTHER): Payer: Medicare Other

## 2020-09-25 VITALS — BP 128/70 | HR 69 | Temp 98.2°F | Ht 70.8 in | Wt 244.4 lb

## 2020-09-25 DIAGNOSIS — R0602 Shortness of breath: Secondary | ICD-10-CM

## 2020-09-25 DIAGNOSIS — G4733 Obstructive sleep apnea (adult) (pediatric): Secondary | ICD-10-CM

## 2020-09-25 DIAGNOSIS — N185 Chronic kidney disease, stage 5: Secondary | ICD-10-CM

## 2020-09-25 MED ORDER — BUDESONIDE-FORMOTEROL FUMARATE 160-4.5 MCG/ACT IN AERO: INHALATION_SPRAY | RESPIRATORY_TRACT | 12 refills | Status: DC

## 2020-09-25 NOTE — Patient Instructions (Addendum)
Order- CXR   dx Chronic Kidney Disease  Refill sent for Symbicort  Your anemia and fluid retention from kidney disease are likely to make you short of breath.

## 2020-09-25 NOTE — Assessment & Plan Note (Signed)
Not hypoxic. O2 sat just after walking hallway was 100%. Likely reflects anemia, fluid retention and overweight. Chest pains better fit with costochondritis as she suggests. Directed to f/u with cardiology if pains get worse.

## 2020-09-25 NOTE — Assessment & Plan Note (Signed)
Benefits from CPAP with adequate compliance and good control Plan- Continue CPAP auto 5- 15

## 2020-09-26 NOTE — Progress Notes (Signed)
Spoke with pt and notified of results per Dr. Spiers Pt verbalized understanding and denied any questions. 

## 2020-09-29 ENCOUNTER — Telehealth: Payer: Self-pay | Admitting: Interventional Cardiology

## 2020-09-29 ENCOUNTER — Emergency Department (HOSPITAL_COMMUNITY): Payer: Medicare Other

## 2020-09-29 ENCOUNTER — Encounter (HOSPITAL_COMMUNITY): Payer: Self-pay

## 2020-09-29 ENCOUNTER — Inpatient Hospital Stay (HOSPITAL_COMMUNITY)
Admission: EM | Admit: 2020-09-29 | Discharge: 2020-10-04 | DRG: 292 | Disposition: A | Discharge status: Home / Self Care | Payer: Medicare Other | Attending: Family Medicine | Admitting: Family Medicine

## 2020-09-29 ENCOUNTER — Other Ambulatory Visit: Payer: Self-pay

## 2020-09-29 DIAGNOSIS — I10 Essential (primary) hypertension: Secondary | ICD-10-CM | POA: Diagnosis present

## 2020-09-29 DIAGNOSIS — E1122 Type 2 diabetes mellitus with diabetic chronic kidney disease: Secondary | ICD-10-CM | POA: Diagnosis present

## 2020-09-29 DIAGNOSIS — Z79890 Hormone replacement therapy (postmenopausal): Secondary | ICD-10-CM

## 2020-09-29 DIAGNOSIS — E039 Hypothyroidism, unspecified: Secondary | ICD-10-CM | POA: Diagnosis present

## 2020-09-29 DIAGNOSIS — G4733 Obstructive sleep apnea (adult) (pediatric): Secondary | ICD-10-CM | POA: Diagnosis present

## 2020-09-29 DIAGNOSIS — Z20822 Contact with and (suspected) exposure to covid-19: Secondary | ICD-10-CM | POA: Diagnosis present

## 2020-09-29 DIAGNOSIS — E8770 Fluid overload, unspecified: Secondary | ICD-10-CM | POA: Diagnosis present

## 2020-09-29 DIAGNOSIS — Z9115 Patient's noncompliance with renal dialysis: Secondary | ICD-10-CM

## 2020-09-29 DIAGNOSIS — Z66 Do not resuscitate: Secondary | ICD-10-CM | POA: Diagnosis present

## 2020-09-29 DIAGNOSIS — E669 Obesity, unspecified: Secondary | ICD-10-CM | POA: Diagnosis present

## 2020-09-29 DIAGNOSIS — I5032 Chronic diastolic (congestive) heart failure: Secondary | ICD-10-CM | POA: Diagnosis present

## 2020-09-29 DIAGNOSIS — R0602 Shortness of breath: Secondary | ICD-10-CM

## 2020-09-29 DIAGNOSIS — E1169 Type 2 diabetes mellitus with other specified complication: Secondary | ICD-10-CM

## 2020-09-29 DIAGNOSIS — Z833 Family history of diabetes mellitus: Secondary | ICD-10-CM

## 2020-09-29 DIAGNOSIS — D649 Anemia, unspecified: Secondary | ICD-10-CM | POA: Diagnosis not present

## 2020-09-29 DIAGNOSIS — Z79899 Other long term (current) drug therapy: Secondary | ICD-10-CM

## 2020-09-29 DIAGNOSIS — I132 Hypertensive heart and chronic kidney disease with heart failure and with stage 5 chronic kidney disease, or end stage renal disease: Principal | ICD-10-CM | POA: Diagnosis present

## 2020-09-29 DIAGNOSIS — Z8042 Family history of malignant neoplasm of prostate: Secondary | ICD-10-CM

## 2020-09-29 DIAGNOSIS — E785 Hyperlipidemia, unspecified: Secondary | ICD-10-CM | POA: Diagnosis present

## 2020-09-29 DIAGNOSIS — Z531 Procedure and treatment not carried out because of patient's decision for reasons of belief and group pressure: Secondary | ICD-10-CM | POA: Diagnosis present

## 2020-09-29 DIAGNOSIS — N185 Chronic kidney disease, stage 5: Secondary | ICD-10-CM | POA: Diagnosis present

## 2020-09-29 DIAGNOSIS — J45909 Unspecified asthma, uncomplicated: Secondary | ICD-10-CM | POA: Diagnosis present

## 2020-09-29 DIAGNOSIS — Z96652 Presence of left artificial knee joint: Secondary | ICD-10-CM | POA: Diagnosis present

## 2020-09-29 DIAGNOSIS — D509 Iron deficiency anemia, unspecified: Secondary | ICD-10-CM | POA: Diagnosis present

## 2020-09-29 DIAGNOSIS — Z6833 Body mass index (BMI) 33.0-33.9, adult: Secondary | ICD-10-CM

## 2020-09-29 DIAGNOSIS — Z7951 Long term (current) use of inhaled steroids: Secondary | ICD-10-CM

## 2020-09-29 DIAGNOSIS — Z8249 Family history of ischemic heart disease and other diseases of the circulatory system: Secondary | ICD-10-CM

## 2020-09-29 DIAGNOSIS — Z5320 Procedure and treatment not carried out because of patient's decision for unspecified reasons: Secondary | ICD-10-CM | POA: Diagnosis not present

## 2020-09-29 DIAGNOSIS — M1990 Unspecified osteoarthritis, unspecified site: Secondary | ICD-10-CM | POA: Diagnosis present

## 2020-09-29 DIAGNOSIS — D638 Anemia in other chronic diseases classified elsewhere: Secondary | ICD-10-CM | POA: Diagnosis present

## 2020-09-29 DIAGNOSIS — D631 Anemia in chronic kidney disease: Secondary | ICD-10-CM | POA: Diagnosis present

## 2020-09-29 DIAGNOSIS — E119 Type 2 diabetes mellitus without complications: Secondary | ICD-10-CM

## 2020-09-29 DIAGNOSIS — Z7982 Long term (current) use of aspirin: Secondary | ICD-10-CM

## 2020-09-29 LAB — CBC WITH DIFFERENTIAL/PLATELET
Abs Immature Granulocytes: 0.01 10*3/uL (ref 0.00–0.07)
Basophils Absolute: 0 10*3/uL (ref 0.0–0.1)
Basophils Relative: 0 %
Eosinophils Absolute: 0.1 10*3/uL (ref 0.0–0.5)
Eosinophils Relative: 2 %
HCT: 23.2 % — ABNORMAL LOW (ref 36.0–46.0)
Hemoglobin: 7.3 g/dL — ABNORMAL LOW (ref 12.0–15.0)
Immature Granulocytes: 0 %
Lymphocytes Relative: 20 %
Lymphs Abs: 0.9 10*3/uL (ref 0.7–4.0)
MCH: 28.3 pg (ref 26.0–34.0)
MCHC: 31.5 g/dL (ref 30.0–36.0)
MCV: 89.9 fL (ref 80.0–100.0)
Monocytes Absolute: 0.6 10*3/uL (ref 0.1–1.0)
Monocytes Relative: 12 %
Neutro Abs: 3.1 10*3/uL (ref 1.7–7.7)
Neutrophils Relative %: 66 %
Platelets: 142 10*3/uL — ABNORMAL LOW (ref 150–400)
RBC: 2.58 MIL/uL — ABNORMAL LOW (ref 3.87–5.11)
RDW: 17.3 % — ABNORMAL HIGH (ref 11.5–15.5)
WBC: 4.7 10*3/uL (ref 4.0–10.5)
nRBC: 0 % (ref 0.0–0.2)

## 2020-09-29 LAB — BRAIN NATRIURETIC PEPTIDE: B Natriuretic Peptide: 470.6 pg/mL — ABNORMAL HIGH (ref 0.0–100.0)

## 2020-09-29 LAB — BASIC METABOLIC PANEL
Anion gap: 12 (ref 5–15)
BUN: 114 mg/dL — ABNORMAL HIGH (ref 8–23)
CO2: 21 mmol/L — ABNORMAL LOW (ref 22–32)
Calcium: 9.6 mg/dL (ref 8.9–10.3)
Chloride: 111 mmol/L (ref 98–111)
Creatinine, Ser: 3.67 mg/dL — ABNORMAL HIGH (ref 0.44–1.00)
GFR, Estimated: 13 mL/min — ABNORMAL LOW (ref >60)
Glucose, Bld: 159 mg/dL — ABNORMAL HIGH (ref 70–99)
Potassium: 4.7 mmol/L (ref 3.5–5.1)
Sodium: 144 mmol/L (ref 135–145)

## 2020-09-29 LAB — POC OCCULT BLOOD, ED: Fecal Occult Bld: NEGATIVE

## 2020-09-29 LAB — TROPONIN I (HIGH SENSITIVITY)
Troponin I (High Sensitivity): 66 ng/L — ABNORMAL HIGH (ref <18)
Troponin I (High Sensitivity): 69 ng/L — ABNORMAL HIGH (ref <18)

## 2020-09-29 NOTE — Telephone Encounter (Signed)
Pt reports that she has had CP for 3-4 weeks.  She describes it as pain in the center of her chest that radiates to both shoulder blades.  She also c/o DOE.  Per Dr. Annamaria Boots  CXR 09/25/20 "shows enlarged heart with pulmonary edema indicating fluid overload".  I attempted to explain to pt this is contributing to DOE however pt not able to understand explanation.  Per pt she alternates torsemide 100mg  and 150 mg QOD per nephrologist instructions.  I advised pt to call 911 for evaluation.  Pt expressed that she is not going to go sit in the ED for 6-7 hours.  She will wait until February when she can be seen by Dr. Tamala Julian per scheduler when she called in.  I advised her that is not a good idea.  I again stressed to her to call 911 for evaluation.  She then expressed that she was told by PCP that CP may be coming from low HGB 7.2 on 09/19/20.  She also reports she was placed in hospice care from 06/12/20-06/30/20 but is no longer active.  I again advised her to call 911 for active CP.  She requested to be scheduled with Dr. Tamala Julian instead.  Will route to MD and RN .

## 2020-09-29 NOTE — ED Notes (Signed)
Pt ambulated with pulse ox. O2 remained high 90s on RA and HR increased to mid-90s. Pt reports shob and weakness and appears to have increase in work of breathing. MD made aware.

## 2020-09-29 NOTE — ED Provider Notes (Signed)
Emergency Medicine Provider Triage Evaluation Note  Tiffany [name] , a 70 y.o. female  was evaluated in triage.  Pt complains of chest pain and shortness of breath.  Patient reports the symptoms have been present and worsening over the past 3 weeks.  She thought that they would just go away but they have continued to get worse.  I saw her pulmonologist, and then tried to get an appointment with her cardiologist today but could not be seen and they told her that she needed to go to the emergency department.  She does have history of heart failure reports worsening lower extremity swelling.  Patient also with history of anemia, reports she was most recently told that her hemoglobin was between 5 and 6, but patient is a [employer] and does not accept blood transfusions.  Review of Systems  Positive: Chest pain, shortness of breath, lower extremity swelling, fatigue Negative: Fever, cough, abdominal pain  Physical Exam  BP (!) 173/92 (BP Location: Left Arm)   Pulse 60   Temp 98.4 F (36.9 C) (Oral)   Resp (!) 22   SpO2 99%  Gen:   Awake, no distress   Resp:  Increased respiratory effort with activity, on auscultation patient with some crackles in bilateral bases MSK:   Moves extremities without difficulty, bilateral lower extremity edema noted Other:    Medical Decision Making  Medically screening exam initiated at 3:00 PM.  Appropriate orders placed.  Tiffany [name] was informed that the remainder of the evaluation will be completed by another provider, this initial triage assessment does not replace that evaluation, and the importance of remaining in the ED until their evaluation is complete.     Jacqlyn Larsen, PA-C 09/29/20 1510    Lacretia Leigh, MD 10/01/20 [phone]

## 2020-09-29 NOTE — Telephone Encounter (Signed)
Spoke with pt and advised her she really should report to the ED for eval of CP.  Spoke about cardiac issues vs symptomatic anemia and the need to treat her either way.  Pt agreeable to go to ER.  Advised I will let Dr. Tamala Julian know that she is going to go.

## 2020-09-29 NOTE — Telephone Encounter (Signed)
Pt c/o of Chest Pain: STAT if CP now or developed within 24 hours  1. Are you having CP right now? Yes   2. Are you experiencing any other symptoms (ex. SOB, nausea, vomiting, sweating)? SOB  3. How long have you been experiencing CP? 3-4 weeks   4. Is your CP continuous or coming and going? Starts when she gets up and moving around   5. Have you taken Nitroglycerin? No. Patient does not have any. The patient just takes one tylenol  Pt c/o Shortness Of Breath: STAT if SOB developed within the last 24 hours or pt is noticeably SOB on the phone  1. Are you currently SOB (can you hear that pt is SOB on the phone)? no  2. How long have you been experiencing SOB? 3-4 weeks  3. Are you SOB when sitting or when up moving around? Up and moving around  4. Are you currently experiencing any other symptoms?  ?

## 2020-09-29 NOTE — ED Triage Notes (Signed)
Pt reports chest pain and SHOB x3 weeks. Pt reports being sent by cardiologist for follow-up.

## 2020-09-29 NOTE — ED Provider Notes (Signed)
[facility] DEPT Provider Note   CSN: [id number] Arrival date & time: 09/29/20  1444     History Chief Complaint  Patient presents with   Chest Pain    Tiffany [name] is a 70 y.o. female.  HPI  70 year old female with past medical history of HTN, HLD, DM, CKD, CHF, chronic anemia presents the emergency department concern for worsening shortness of breath.  Patient used to receive infusions/shots for her anemia which she has recently restarted.  For the past 3 weeks she has been having worsening shortness of breath that has recently become severe.  She has difficulty with ambulation and caring for herself because she become so symptomatic and short of breath at home.  Denies any active chest pain.  No recent fever, productive cough.  She has baseline swelling of her lower extremities that is slightly worse than normal.  Denies any history of GI bleeding, dark stools.  She is on a baby aspirin but no other anticoagulation.  Past Medical History:  Diagnosis Date   ALLERGIC RHINITIS    Arthritis    CHF (congestive heart failure) (HCC)    Chronic diastolic heart failure ([place]) 05/05/2013   Echo 03/2018:  EF 17-61, +diastolic dysfunction, GLS -20.5%, normal RVSF, RVSP 42.5 (mild elevated), severe LAE, small pericardial eff, mild MAc, mod AV calc, mild PI   CKD (chronic kidney disease)    Diabetes mellitus    Dyspnea    Heart murmur    at birth   Hyperlipidemia    Hypertension    Hypothyroid    Iron deficiency anemia    Obesity    OSA on CPAP    Previous back surgery    x 2   Refusal of blood transfusions as patient is Jehovah's Witness    S/P arthroscopy     Patient Active Problem List   Diagnosis Date Noted   ESRD (end stage renal disease) ([place]) 06/04/2020   Acute on chronic diastolic (congestive) heart failure ([place]) 04/26/2020   Type 2 diabetes mellitus with stage 5 chronic kidney disease ([place]) 04/26/2020   Acute CHF (congestive heart failure)  ([place]) 02/01/2020   S/P left TKA 07/01/2019   Status post total left knee replacement 07/01/2019   Hyperlipidemia 09/12/2016   Type 2 diabetes mellitus without complications ([place]) 60/73/7106   Tachycardia 08/26/2016   Palpitation 08/26/2016   SOB (shortness of breath) 08/26/2016   Chronic kidney disease, stage V ([place]) 04/28/2014   Chronic diastolic heart failure ([place]) 05/05/2013   Essential hypertension 05/05/2013   HEART MURMUR, SYSTOLIC 26/94/8546   Hypothyroidism 02/12/2007   OBESITY 01/08/2007   Obstructive sleep apnea 01/08/2007   Seasonal and perennial allergic rhinitis 01/08/2007    Past Surgical History:  Procedure Laterality Date   BREAST BIOPSY  20+ yrs ago   dont know which breast per pt; benign   BREAST REDUCTION SURGERY  1982   KNEE SURGERY     x 2   LUMBAR DISC SURGERY     x 2   REDUCTION MAMMAPLASTY Bilateral 1985   TOE SURGERY     TOTAL ABDOMINAL HYSTERECTOMY  1997   partial hysterectomy- still has ovaries   TOTAL KNEE ARTHROPLASTY Left 07/01/2019   Procedure: TOTAL KNEE ARTHROPLASTY;  Surgeon: Paralee Cancel, MD;  Location: [facility];  Service: Orthopedics;  Laterality: Left;  70 min NO BLOOD PRODUCTS!!   TREATMENT FISTULA ANAL       OB History   No obstetric history on file.  Family History  Problem Relation Age of Onset   Prostate cancer Father    Congestive Heart Failure Mother    Diabetes Sister    Other Sister        septis   Breast cancer Neg Hx     Social History   Tobacco Use   Smoking status: Never   Smokeless tobacco: Never  Vaping Use   Vaping Use: Never used  Substance Use Topics   Alcohol use: Yes    Alcohol/week: 0.0 standard drinks    Comment: rare   Drug use: No    Home Medications Prior to Admission medications   Medication Sig Start Date End Date Taking? Authorizing Provider  albuterol (VENTOLIN HFA) 108 (90 Base) MCG/ACT inhaler Inhale 2 puffs into the lungs every 6 (six) hours as needed for wheezing or shortness of  breath.    [provider]  amLODipine (NORVASC) 10 MG tablet Take 10 mg by mouth 2 (two) times daily.    [provider]  aspirin EC 81 MG tablet Take 81 mg by mouth daily. Swallow whole.    [provider]  B Complex-C (B-COMPLEX WITH VITAMIN C) tablet Take 1 tablet by mouth daily.    [provider]  budesonide-formoterol [facility]) 160-4.5 MCG/ACT inhaler Inhale 2 puffs then rinse mouth, twice daily 09/25/20   Baird Lyons D, MD  calcitRIOL (ROCALTROL) 0.25 MCG capsule Take 0.25 mcg by mouth daily.    [provider]  carvedilol (COREG) 6.25 MG tablet Take 6.25 mg by mouth 2 (two) times daily with a meal.    [provider]  cetirizine (ZYRTEC) 10 MG tablet Take 10 mg by mouth daily.    [provider]  cloNIDine (CATAPRES - DOSED IN MG/24 HR) 0.3 mg/24hr patch Place 0.3 mg onto the skin once a week.    [provider]  ferrous sulfate 324 MG TBEC Take 324 mg by mouth daily.    [provider]  hydrALAZINE (APRESOLINE) 50 MG tablet Take 50 mg by mouth daily.    [provider]  levothyroxine (SYNTHROID) 200 MCG tablet Take 200 mcg by mouth daily before breakfast.    [provider]  Multiple Vitamin (MULTIVITAMIN WITH MINERALS) TABS tablet Take 1 tablet by mouth daily.    [provider]  Propylene Glycol (SYSTANE COMPLETE) 0.6 % SOLN Apply to eye.    [provider]  torsemide (DEMADEX) 100 MG tablet Take 100 mg by mouth daily.    [provider]  zolpidem (AMBIEN) 10 MG tablet Take 10 mg by mouth at bedtime as needed for sleep.    [provider]    Allergies    Patient has no known allergies.  Review of Systems   Review of Systems  Constitutional:  Positive for diaphoresis and fatigue. Negative for chills and fever.  HENT:  Negative for congestion.   Eyes:  Negative for visual disturbance.  Respiratory:  Positive for shortness of breath.    Cardiovascular:  Positive for leg swelling. Negative for chest pain and palpitations.  Gastrointestinal:  Negative for abdominal pain, diarrhea and vomiting.  Genitourinary:  Negative for dysuria.  Skin:  Negative for rash.  Neurological:  Positive for light-headedness. Negative for headaches.   Physical Exam Updated Vital Signs BP (!) 156/93   Pulse (!) 58   Temp 98.4 F (36.9 C) (Oral)   Resp 18   SpO2 100%   Physical Exam Vitals and nursing note reviewed.  Constitutional:  Appearance: Normal appearance.  HENT:     Head: Normocephalic.     Mouth/Throat:     Mouth: Mucous membranes are moist.  Cardiovascular:     Rate and Rhythm: Normal rate.  Pulmonary:     Effort: Tachypnea and accessory muscle usage present.     Breath sounds: Examination of the right-lower field reveals decreased breath sounds. Examination of the left-lower field reveals decreased breath sounds. Decreased breath sounds and rales present.  Abdominal:     Palpations: Abdomen is soft.     Tenderness: There is no abdominal tenderness.  Musculoskeletal:     Right lower leg: Edema present.     Left lower leg: Edema present.  Skin:    General: Skin is warm.  Neurological:     Mental Status: She is alert and oriented to person, place, and time. Mental status is at baseline.  Psychiatric:        Mood and Affect: Mood normal.    ED Results / Procedures / Treatments   Labs (all labs ordered are listed, but only abnormal results are displayed) Labs Reviewed  BASIC METABOLIC PANEL - Abnormal; Notable for the following components:      Result Value   CO2 21 (*)    Glucose, Bld 159 (*)    BUN 114 (*)    Creatinine, Ser 3.67 (*)    GFR, Estimated 13 (*)    All other components within normal limits  BRAIN NATRIURETIC PEPTIDE - Abnormal; Notable for the following components:   B Natriuretic Peptide 470.6 (*)    All other components within normal limits  CBC WITH DIFFERENTIAL/PLATELET - Abnormal;  Notable for the following components:   RBC 2.58 (*)    Hemoglobin 7.3 (*)    HCT 23.2 (*)    RDW 17.3 (*)    Platelets 142 (*)    All other components within normal limits  TROPONIN I (HIGH SENSITIVITY) - Abnormal; Notable for the following components:   Troponin I (High Sensitivity) 66 (*)    All other components within normal limits  TROPONIN I (HIGH SENSITIVITY) - Abnormal; Notable for the following components:   Troponin I (High Sensitivity) 69 (*)    All other components within normal limits  POC OCCULT BLOOD, ED    EKG EKG Interpretation  Date/Time:  Friday September 29 2020 14:58:16 EDT Ventricular Rate:  60 PR Interval:  183 QRS Duration: 102 QT Interval:  481 QTC Calculation: 481 R Axis:   61 Text Interpretation: Sinus rhythm Atrial premature complex Low voltage, extremity and precordial leads Probable anteroseptal infarct, old NSR, PAC Confirmed by Lavenia Atlas [phone]) on 09/29/2020 9:18:24 PM  Radiology DG Chest 2 View  Result Date: 09/29/2020 CLINICAL DATA:  Chest pain, shortness of breath EXAM: CHEST - 2 VIEW COMPARISON:  Chest radiograph 09/25/2020 FINDINGS: The heart is enlarged, unchanged. The mediastinal contours are stable. There is calcified atherosclerotic plaque of the aortic arch. There is vascular congestion without frank pulmonary edema, improved compared to the prior study. There is no focal consolidation. There is no pleural effusion or pneumothorax. There is no acute osseous abnormality. IMPRESSION: Unchanged cardiomegaly with improved pulmonary edema compared to the study from 09/25/2020. Electronically Signed   By: Valetta Mole M.D.   On: 09/29/2020 15:56    Procedures Procedures   Medications Ordered in ED Medications - No data to display  ED Course  I have reviewed the triage vital signs and the nursing notes.  Pertinent labs & imaging results  that were available during my care of the patient were reviewed by me and considered in my medical  decision making (see chart for details).    MDM Rules/Calculators/A&P                           70 year old female presents emergency department for worsening shortness of breath.  She is noticeably dyspneic on exam.  Has scattered rales on lung auscultation.  Maintaining her oxygen saturation on room air.  Blood work shows an anemia of 7 down from a baseline of 10.  Fecal occult is negative.  Blood work otherwise shows a baseline CKD, troponin and BNP.  Chest x-ray shows mild pulmonary vascular congestion but this is improved from a chest x-ray from a couple days ago.  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  Had a long discussion with the patient about this refusal of treatment and the side effects of anemia.  Patient understands.  We ambulated the patient with pulse ox, she maintains her oxygen saturations but becomes extremely short of breath and required assistance back to the bed.  Patient will be admitted to hospital service for symptomatic anemia and other forms of treatment.  Patients evaluation and results requires admission for further treatment and care. Patient agrees with admission plan, offers no new complaints and is stable/unchanged at time of admit.  Final Clinical Impression(s) / ED Diagnoses Final diagnoses:  Anemia, unspecified type  Shortness of breath    Rx / DC Orders ED Discharge Orders     None        Lorelle Gibbs, DO 09/29/20 2350

## 2020-09-30 DIAGNOSIS — R0602 Shortness of breath: Secondary | ICD-10-CM | POA: Diagnosis not present

## 2020-09-30 DIAGNOSIS — D631 Anemia in chronic kidney disease: Secondary | ICD-10-CM | POA: Diagnosis present

## 2020-09-30 DIAGNOSIS — Z66 Do not resuscitate: Secondary | ICD-10-CM | POA: Diagnosis present

## 2020-09-30 DIAGNOSIS — E669 Obesity, unspecified: Secondary | ICD-10-CM | POA: Diagnosis present

## 2020-09-30 DIAGNOSIS — E039 Hypothyroidism, unspecified: Secondary | ICD-10-CM | POA: Diagnosis present

## 2020-09-30 DIAGNOSIS — Z9115 Patient's noncompliance with renal dialysis: Secondary | ICD-10-CM | POA: Diagnosis not present

## 2020-09-30 DIAGNOSIS — Z5320 Procedure and treatment not carried out because of patient's decision for unspecified reasons: Secondary | ICD-10-CM | POA: Diagnosis not present

## 2020-09-30 DIAGNOSIS — N185 Chronic kidney disease, stage 5: Secondary | ICD-10-CM | POA: Diagnosis present

## 2020-09-30 DIAGNOSIS — J45909 Unspecified asthma, uncomplicated: Secondary | ICD-10-CM | POA: Diagnosis present

## 2020-09-30 DIAGNOSIS — E1122 Type 2 diabetes mellitus with diabetic chronic kidney disease: Secondary | ICD-10-CM | POA: Diagnosis present

## 2020-09-30 DIAGNOSIS — Z7189 Other specified counseling: Secondary | ICD-10-CM | POA: Diagnosis not present

## 2020-09-30 DIAGNOSIS — E785 Hyperlipidemia, unspecified: Secondary | ICD-10-CM | POA: Diagnosis present

## 2020-09-30 DIAGNOSIS — D509 Iron deficiency anemia, unspecified: Secondary | ICD-10-CM | POA: Diagnosis present

## 2020-09-30 DIAGNOSIS — Z8042 Family history of malignant neoplasm of prostate: Secondary | ICD-10-CM | POA: Diagnosis not present

## 2020-09-30 DIAGNOSIS — Z515 Encounter for palliative care: Secondary | ICD-10-CM | POA: Diagnosis not present

## 2020-09-30 DIAGNOSIS — D649 Anemia, unspecified: Secondary | ICD-10-CM | POA: Diagnosis present

## 2020-09-30 DIAGNOSIS — Z7951 Long term (current) use of inhaled steroids: Secondary | ICD-10-CM | POA: Diagnosis not present

## 2020-09-30 DIAGNOSIS — Z8249 Family history of ischemic heart disease and other diseases of the circulatory system: Secondary | ICD-10-CM | POA: Diagnosis not present

## 2020-09-30 DIAGNOSIS — E119 Type 2 diabetes mellitus without complications: Secondary | ICD-10-CM | POA: Diagnosis not present

## 2020-09-30 DIAGNOSIS — M1990 Unspecified osteoarthritis, unspecified site: Secondary | ICD-10-CM | POA: Diagnosis present

## 2020-09-30 DIAGNOSIS — Z7982 Long term (current) use of aspirin: Secondary | ICD-10-CM | POA: Diagnosis not present

## 2020-09-30 DIAGNOSIS — Z833 Family history of diabetes mellitus: Secondary | ICD-10-CM | POA: Diagnosis not present

## 2020-09-30 DIAGNOSIS — I132 Hypertensive heart and chronic kidney disease with heart failure and with stage 5 chronic kidney disease, or end stage renal disease: Secondary | ICD-10-CM | POA: Diagnosis present

## 2020-09-30 DIAGNOSIS — E8770 Fluid overload, unspecified: Secondary | ICD-10-CM | POA: Diagnosis present

## 2020-09-30 DIAGNOSIS — I10 Essential (primary) hypertension: Secondary | ICD-10-CM | POA: Diagnosis not present

## 2020-09-30 DIAGNOSIS — G4733 Obstructive sleep apnea (adult) (pediatric): Secondary | ICD-10-CM | POA: Diagnosis present

## 2020-09-30 DIAGNOSIS — Z6833 Body mass index (BMI) 33.0-33.9, adult: Secondary | ICD-10-CM | POA: Diagnosis not present

## 2020-09-30 DIAGNOSIS — Z96652 Presence of left artificial knee joint: Secondary | ICD-10-CM | POA: Diagnosis present

## 2020-09-30 DIAGNOSIS — I5032 Chronic diastolic (congestive) heart failure: Secondary | ICD-10-CM | POA: Diagnosis present

## 2020-09-30 DIAGNOSIS — Z20822 Contact with and (suspected) exposure to covid-19: Secondary | ICD-10-CM | POA: Diagnosis present

## 2020-09-30 LAB — CBG MONITORING, ED
Glucose-Capillary: 137 mg/dL — ABNORMAL HIGH (ref 70–99)
Glucose-Capillary: 163 mg/dL — ABNORMAL HIGH (ref 70–99)

## 2020-09-30 LAB — CBC
HCT: 24.3 % — ABNORMAL LOW (ref 36.0–46.0)
Hemoglobin: 7.6 g/dL — ABNORMAL LOW (ref 12.0–15.0)
MCH: 28.4 pg (ref 26.0–34.0)
MCHC: 31.3 g/dL (ref 30.0–36.0)
MCV: 90.7 fL (ref 80.0–100.0)
Platelets: 151 10*3/uL (ref 150–400)
RBC: 2.68 MIL/uL — ABNORMAL LOW (ref 3.87–5.11)
RDW: 17.5 % — ABNORMAL HIGH (ref 11.5–15.5)
WBC: 4.4 10*3/uL (ref 4.0–10.5)
nRBC: 0 % (ref 0.0–0.2)

## 2020-09-30 LAB — BASIC METABOLIC PANEL
Anion gap: 10 (ref 5–15)
BUN: 105 mg/dL — ABNORMAL HIGH (ref 8–23)
CO2: 20 mmol/L — ABNORMAL LOW (ref 22–32)
Calcium: 9.3 mg/dL (ref 8.9–10.3)
Chloride: 110 mmol/L (ref 98–111)
Creatinine, Ser: 3.86 mg/dL — ABNORMAL HIGH (ref 0.44–1.00)
GFR, Estimated: 12 mL/min — ABNORMAL LOW (ref >60)
Glucose, Bld: 184 mg/dL — ABNORMAL HIGH (ref 70–99)
Potassium: 4.7 mmol/L (ref 3.5–5.1)
Sodium: 140 mmol/L (ref 135–145)

## 2020-09-30 LAB — HEMOGLOBIN A1C
Hgb A1c MFr Bld: 5.8 % — ABNORMAL HIGH (ref 4.8–5.6)
Mean Plasma Glucose: 119.76 mg/dL

## 2020-09-30 LAB — GLUCOSE, CAPILLARY
Glucose-Capillary: 135 mg/dL — ABNORMAL HIGH (ref 70–99)
Glucose-Capillary: 151 mg/dL — ABNORMAL HIGH (ref 70–99)

## 2020-09-30 LAB — VITAMIN B12: Vitamin B-12: >7500 pg/mL — ABNORMAL HIGH (ref 180–914)

## 2020-09-30 LAB — SARS CORONAVIRUS 2 (TAT 6-24 HRS): SARS Coronavirus 2: NEGATIVE

## 2020-09-30 LAB — FOLATE: Folate: 12.7 ng/mL (ref >5.9)

## 2020-09-30 MED ORDER — FERROUS SULFATE 325 (65 FE) MG PO TABS
324.0000 mg | ORAL_TABLET | Freq: Every day | ORAL | Status: DC
  Administered 2020-09-30 – 2020-10-04 (×5): 324 mg via ORAL
  Filled 2020-09-30 (×5): qty 1

## 2020-09-30 MED ORDER — OMEGA-3-ACID ETHYL ESTERS 1 G PO CAPS
1.0000 g | ORAL_CAPSULE | Freq: Every day | ORAL | Status: DC
  Administered 2020-09-30 – 2020-10-04 (×5): 1 g via ORAL
  Filled 2020-09-30 (×5): qty 1

## 2020-09-30 MED ORDER — CLONIDINE HCL 0.3 MG/24HR TD PTWK
0.3000 mg | MEDICATED_PATCH | TRANSDERMAL | Status: DC
  Administered 2020-09-30: 0.3 mg via TRANSDERMAL
  Filled 2020-09-30: qty 1

## 2020-09-30 MED ORDER — CLONIDINE HCL 0.3 MG/24HR TD PTWK: 0.3000 mg | MEDICATED_PATCH | TRANSDERMAL | Status: DC

## 2020-09-30 MED ORDER — TORSEMIDE 100 MG PO TABS: 100.0000 mg | ORAL_TABLET | ORAL | Status: DC

## 2020-09-30 MED ORDER — INSULIN ASPART 100 UNIT/ML IJ SOLN
0.0000 [IU] | Freq: Three times a day (TID) | INTRAMUSCULAR | Status: DC
  Administered 2020-09-30 – 2020-10-04 (×8): 1 [IU] via SUBCUTANEOUS
  Filled 2020-09-30: qty 0.06

## 2020-09-30 MED ORDER — ZOLPIDEM TARTRATE 5 MG PO TABS
10.0000 mg | ORAL_TABLET | Freq: Every evening | ORAL | Status: DC | PRN
  Administered 2020-10-01 – 2020-10-03 (×4): 10 mg via ORAL
  Filled 2020-09-30 (×4): qty 2

## 2020-09-30 MED ORDER — FUROSEMIDE 10 MG/ML IJ SOLN
100.0000 mg | Freq: Three times a day (TID) | INTRAVENOUS | Status: DC
  Administered 2020-09-30 – 2020-10-04 (×11): 100 mg via INTRAVENOUS
  Filled 2020-09-30 (×14): qty 10

## 2020-09-30 MED ORDER — TORSEMIDE 20 MG PO TABS
150.0000 mg | ORAL_TABLET | ORAL | Status: DC
  Administered 2020-09-30: 150 mg via ORAL
  Filled 2020-09-30: qty 1

## 2020-09-30 MED ORDER — HYDRALAZINE HCL 50 MG PO TABS
50.0000 mg | ORAL_TABLET | Freq: Three times a day (TID) | ORAL | Status: DC
  Administered 2020-09-30 – 2020-10-04 (×14): 50 mg via ORAL
  Filled 2020-09-30 (×4): qty 1
  Filled 2020-09-30: qty 2
  Filled 2020-09-30 (×9): qty 1

## 2020-09-30 MED ORDER — AMLODIPINE BESYLATE 10 MG PO TABS
10.0000 mg | ORAL_TABLET | Freq: Two times a day (BID) | ORAL | Status: DC
  Administered 2020-09-30 – 2020-10-04 (×10): 10 mg via ORAL
  Filled 2020-09-30 (×8): qty 1
  Filled 2020-09-30 (×2): qty 2

## 2020-09-30 MED ORDER — METOLAZONE 5 MG PO TABS
5.0000 mg | ORAL_TABLET | ORAL | Status: DC
  Administered 2020-10-01 – 2020-10-04 (×4): 5 mg via ORAL
  Filled 2020-09-30 (×4): qty 1

## 2020-09-30 MED ORDER — ALBUTEROL SULFATE HFA 108 (90 BASE) MCG/ACT IN AERS
2.0000 | INHALATION_SPRAY | Freq: Four times a day (QID) | RESPIRATORY_TRACT | Status: DC | PRN
  Administered 2020-09-30 – 2020-10-02 (×2): 2 via RESPIRATORY_TRACT
  Filled 2020-09-30: qty 6.7

## 2020-09-30 MED ORDER — LORAZEPAM 1 MG PO TABS: 1.0000 mg | ORAL_TABLET | ORAL | Status: DC | PRN

## 2020-09-30 MED ORDER — METOLAZONE 5 MG PO TABS
5.0000 mg | ORAL_TABLET | Freq: Once | ORAL | Status: AC
  Administered 2020-09-30: 5 mg via ORAL
  Filled 2020-09-30: qty 1

## 2020-09-30 MED ORDER — ASPIRIN EC 81 MG PO TBEC
81.0000 mg | DELAYED_RELEASE_TABLET | Freq: Every day | ORAL | Status: DC
  Administered 2020-09-30 – 2020-10-04 (×5): 81 mg via ORAL
  Filled 2020-09-30 (×5): qty 1

## 2020-09-30 MED ORDER — CALCITRIOL 0.25 MCG PO CAPS
0.2500 ug | ORAL_CAPSULE | Freq: Every day | ORAL | Status: DC
  Administered 2020-09-30 – 2020-10-04 (×5): 0.25 ug via ORAL
  Filled 2020-09-30 (×6): qty 1

## 2020-09-30 MED ORDER — CARVEDILOL 6.25 MG PO TABS
6.2500 mg | ORAL_TABLET | Freq: Every day | ORAL | Status: DC
  Administered 2020-09-30 – 2020-10-03 (×5): 6.25 mg via ORAL
  Filled 2020-09-30 (×4): qty 1
  Filled 2020-09-30: qty 2

## 2020-09-30 MED ORDER — LORATADINE 10 MG PO TABS
10.0000 mg | ORAL_TABLET | Freq: Every day | ORAL | Status: DC
  Filled 2020-09-30 (×5): qty 1

## 2020-09-30 MED ORDER — LEVOTHYROXINE SODIUM 112 MCG PO TABS
224.0000 ug | ORAL_TABLET | Freq: Every day | ORAL | Status: DC
  Administered 2020-09-30 – 2020-10-04 (×5): 224 ug via ORAL
  Filled 2020-09-30 (×5): qty 2

## 2020-09-30 MED ORDER — MOMETASONE FURO-FORMOTEROL FUM 200-5 MCG/ACT IN AERO
2.0000 | INHALATION_SPRAY | Freq: Two times a day (BID) | RESPIRATORY_TRACT | Status: DC
  Administered 2020-09-30 – 2020-10-04 (×8): 2 via RESPIRATORY_TRACT
  Filled 2020-09-30: qty 8.8

## 2020-09-30 NOTE — Progress Notes (Signed)
Pt refused cpap tonight she said her son will bring her home cpap tomorrow and she will wear it then.

## 2020-09-30 NOTE — ED Notes (Signed)
Purple DNR sticker placed on pt armband.

## 2020-09-30 NOTE — Progress Notes (Addendum)
Patient ID: Tiffany [name], female   DOB: Jan 24, 1951, 70 y.o.   MRN: [id number]  PROGRESS NOTE    Tiffany [name]  JKK:[id number] DOB: 16-Aug-1950 DOA: 09/29/2020 PCP: Nolene Ebbs, MD   Brief Narrative:  Patient-year-old female with history of CKD stage V not on dialysis, chronic diastolic heart failure, hypertension, OSA on CPAP, asthma, hypothyroidism, type 2 diabetes mellitus and Jehovah's Witness who presented with chest pain and shortness of breath.  On presentation, hemoglobin was 7.3 (baseline around 9-10) with platelets of 142 and creatinine of 3.67.  Troponin was 66 and 69.  EKG without any significant ST or T wave changes.  BNP of 470.  Chest x-ray showed improvement in pulmonary edema from prior.  Assessment & Plan:   Symptomatic anemia/anemia of chronic disease -Possibly from chronic kidney disease -Recently had iron panel which showed anemia of chronic disease.  FOBT negative.  She had recently received Epogen on 09/19/2020 and apparently is scheduled for another 1 on 10/03/2020 -Patient is a Jehovah's Witness and does not want blood products.  Chest pain, likely secondary to symptomatic anemia -chronic diastolic heart failure, hypertension, OSA on CPAP, asthma, hypothyroidism and type 2 diabetes.  Outpatient follow-up with cardiology  Chronic diastolic heart failure -Chest x-ray on presentation showed improvement in pulmonary edema compared to chest x-ray done on 09/25/2020.  Currently on very high doses of oral torsemide.  Strict input and output.  Daily weights.  Fluid restriction.  I have consulted nephrology regarding diuretic management  Chronic kidney disease stage V -Creatinine 3.67 on presentation.  Check creatinine today.  Diuretic plan as above.  Follow nephrology recommendations.  Hypertension -Blood pressure still elevated.  Continue amlodipine, Coreg, clonidine, hydralazine and torsemide  OSA -Continue CPAP  Hypothyroidism -Continue Synthroid  Diabetes  mellitus type 2 -A1c 5.8.  Continues CBGs with SSI  Generalized conditioning -Patient was discharged to residential hospice in May 2022 but subsequently discharged from residential hospice because of improvement in general condition. -Patient is still chronically ill and deconditioned.  Will consult palliative care for goals of care discussion.  DVT prophylaxis: SCDs Code Status: DNR Family Communication: None at bedside Disposition Plan: Status is: Observation  The patient will require care spanning > 2 midnights and should be moved to inpatient because: Inpatient level of care appropriate due to severity of illness  Dispo: The patient is from: Home              Anticipated d/c is to: Home              Patient currently is not medically stable to d/c.   Difficult to place patient No  Consultants: Nephrology/palliative care  Procedures: None  Antimicrobials: None   Subjective: Patient seen and examined at bedside.  Still complains of chest tightness and shortness of breath with minimal exertion.  No fever, vomiting, abdominal pain reported.  Objective: Vitals:   09/30/20 0621 09/30/20 0645 09/30/20 0730 09/30/20 0910  BP: (!) 194/89 (!) 167/79 (!) 170/81 (!) 181/72  Pulse: 77 (!) 58 60   Resp: 19  20   Temp:      TempSrc:      SpO2: 100% 100% 100%    No intake or output data in the 24 hours ending 09/30/20 1108 There were no vitals filed for this visit.  Examination:  General exam: Appears calm and comfortable.  Currently on room air. Respiratory system: Bilateral decreased breath sounds at bases with some scattered crackles Cardiovascular system: S1 & S2 heard,  intermittently bradycardic gastrointestinal system: Abdomen is nondistended, soft and nontender. Normal bowel sounds heard. Extremities: No cyanosis, clubbing; lower extremity edema present Central nervous system: Alert and oriented. No focal neurological deficits. Moving extremities Skin: No rashes, lesions  or ulcers Psychiatry: Judgement and insight appear normal. Mood & affect appropriate.     Data Reviewed: I have personally reviewed following labs and imaging studies  CBC: Recent Labs  Lab 09/29/20 1525 09/30/20 0200  WBC 4.7 4.4  NEUTROABS 3.1  --   HGB 7.3* 7.6*  HCT 23.2* 24.3*  MCV 89.9 90.7  PLT 142* 557   Basic Metabolic Panel: Recent Labs  Lab 09/29/20 1525  NA 144  K 4.7  CL 111  CO2 21*  GLUCOSE 159*  BUN 114*  CREATININE 3.67*  CALCIUM 9.6   GFR: Estimated Creatinine Clearance: 19.8 mL/min (A) (by C-G formula based on SCr of 3.67 mg/dL (H)). Liver Function Tests: No results for input(s): AST, ALT, ALKPHOS, BILITOT, PROT, ALBUMIN in the last 168 hours. No results for input(s): LIPASE, AMYLASE in the last 168 hours. No results for input(s): AMMONIA in the last 168 hours. Coagulation Profile: No results for input(s): INR, PROTIME in the last 168 hours. Cardiac Enzymes: No results for input(s): CKTOTAL, CKMB, CKMBINDEX, TROPONINI in the last 168 hours. BNP (last 3 results) Recent Labs    01/25/20 1506  PROBNP 2,507*   HbA1C: Recent Labs    09/30/20 0117  HGBA1C 5.8*   CBG: Recent Labs  Lab 09/30/20 0824  GLUCAP 137*   Lipid Profile: No results for input(s): CHOL, HDL, LDLCALC, TRIG, CHOLHDL, LDLDIRECT in the last 72 hours. Thyroid Function Tests: No results for input(s): TSH, T4TOTAL, FREET4, T3FREE, THYROIDAB in the last 72 hours. Anemia Panel: Recent Labs    09/30/20 0103  VITAMINB12 >7,500*  FOLATE 12.7   Sepsis Labs: No results for input(s): PROCALCITON, LATICACIDVEN in the last 168 hours.  Recent Results (from the past 240 hour(s))  SARS CORONAVIRUS 2 (TAT 6-24 HRS) Nasopharyngeal Nasopharyngeal Swab     Status: None   Collection Time: 09/30/20 12:03 AM   Specimen: Nasopharyngeal Swab  Result Value Ref Range Status   SARS Coronavirus 2 NEGATIVE NEGATIVE Final    Comment: (NOTE) SARS-CoV-2 target nucleic acids are NOT  DETECTED.  The SARS-CoV-2 RNA is generally detectable in upper and lower respiratory specimens during the acute phase of infection. Negative results do not preclude SARS-CoV-2 infection, do not rule out co-infections with other pathogens, and should not be used as the sole basis for treatment or other patient management decisions. Negative results must be combined with clinical observations, patient history, and epidemiological information. The expected result is Negative.  Fact Sheet for Patients: SugarRoll.be  Fact Sheet for Healthcare Providers: https://www.woods-mathews.com/  This test is not yet approved or cleared by the [place] FDA and  has been authorized for detection and/or diagnosis of SARS-CoV-2 by FDA under an Emergency Use Authorization (EUA). This EUA will remain  in effect (meaning this test can be used) for the duration of the COVID-19 declaration under Se ction 564(b)(1) of the Act, 21 U.S.C. section 360bbb-3(b)(1), unless the authorization is terminated or revoked sooner.  Performed at [facility], [place] [address], [place], [place] [address]          Radiology Studies: DG Chest 2 View  Result Date: 09/29/2020 CLINICAL DATA:  Chest pain, shortness of breath EXAM: CHEST - 2 VIEW COMPARISON:  Chest radiograph 09/25/2020 FINDINGS: The heart is enlarged,  unchanged. The mediastinal contours are stable. There is calcified atherosclerotic plaque of the aortic arch. There is vascular congestion without frank pulmonary edema, improved compared to the prior study. There is no focal consolidation. There is no pleural effusion or pneumothorax. There is no acute osseous abnormality. IMPRESSION: Unchanged cardiomegaly with improved pulmonary edema compared to the study from 09/25/2020. Electronically Signed   By: Valetta Mole M.D.   On: 09/29/2020 15:56        Scheduled Meds:  amLODipine  10 mg Oral BID   aspirin EC   81 mg Oral Daily   calcitRIOL  0.25 mcg Oral Daily   carvedilol  6.25 mg Oral QHS   cloNIDine  0.3 mg Transdermal Weekly   ferrous sulfate  324 mg Oral Daily   hydrALAZINE  50 mg Oral TID   insulin aspart  0-6 Units Subcutaneous TID WC   levothyroxine  224 mcg Oral Q0600   loratadine  10 mg Oral Daily   mometasone-formoterol  2 puff Inhalation BID   omega-3 acid ethyl esters  1 g Oral Daily   [START ON 10/01/2020] torsemide  100 mg Oral QODAY   torsemide  150 mg Oral QODAY   Continuous Infusions:        Aline August, MD [facility] 09/30/2020, 11:08 AM

## 2020-09-30 NOTE — Consult Note (Signed)
Renal Service Consult Note [facility]  Tiffany [name] 09/30/2020 Tiffany Blazing, MD Requesting Physician: Dr. Starla Link  Reason for Consult: CKD w/ SOB and CP HPI: The patient is a 70 y.o. year-old w/ hx of advanced CKD, HTN, OSA on CPAP, DM2 presented w/ CP and SOB. In ED BP 170/58, Hb 7.3, creat 3.6.  CXR showed improved pulm edema from prior. Admitted for worsening anemia and SOB/ pulm edema w/ DOE.  Creat is 3.6, close to her last couple of admissions 3.5- 4.5.  Asked to see for renal failure and volume overload.   Pt has been on demadex 100 mg and 150 mg every alternating day. Last saw her renal MD Dr Johnney Ou in August.   No cough, fevers or malaise. +leg swelling. +DOE walking across the room.    Lives    ROS - denies CP, no joint pain, no HA, no blurry vision, no rash, no diarrhea, no nausea/ vomiting, no dysuria, no difficulty voiding   Past Medical History  Past Medical History:  Diagnosis Date   ALLERGIC RHINITIS    Arthritis    CHF (congestive heart failure) (HCC)    Chronic diastolic heart failure ([place]) 05/05/2013   Echo 03/2018:  EF 17-51, +diastolic dysfunction, GLS -20.5%, normal RVSF, RVSP 42.5 (mild elevated), severe LAE, small pericardial eff, mild MAc, mod AV calc, mild PI   CKD (chronic kidney disease)    Diabetes mellitus    Dyspnea    Heart murmur    at birth   Hyperlipidemia    Hypertension    Hypothyroid    Iron deficiency anemia    Obesity    OSA on CPAP    Previous back surgery    x 2   Refusal of blood transfusions as patient is Jehovah's Witness    S/P arthroscopy    Past Surgical History  Past Surgical History:  Procedure Laterality Date   BREAST BIOPSY  20+ yrs ago   dont know which breast per pt; benign   BREAST REDUCTION SURGERY  1982   KNEE SURGERY     x 2   LUMBAR DISC SURGERY     x 2   REDUCTION MAMMAPLASTY Bilateral 1985   TOE SURGERY     TOTAL ABDOMINAL HYSTERECTOMY  1997   partial hysterectomy- still has  ovaries   TOTAL KNEE ARTHROPLASTY Left 07/01/2019   Procedure: TOTAL KNEE ARTHROPLASTY;  Surgeon: Paralee Cancel, MD;  Location: [facility];  Service: Orthopedics;  Laterality: Left;  70 min NO BLOOD PRODUCTS!!   TREATMENT FISTULA ANAL     Family History  Family History  Problem Relation Age of Onset   Prostate cancer Father    Congestive Heart Failure Mother    Diabetes Sister    Other Sister        septis   Breast cancer Neg Hx    Social History  reports that she has never smoked. She has never used smokeless tobacco. She reports current alcohol use. She reports that she does not use drugs. Allergies No Known Allergies Home medications Prior to Admission medications   Medication Sig Start Date End Date Taking? Authorizing Provider  albuterol (VENTOLIN HFA) 108 (90 Base) MCG/ACT inhaler Inhale 2 puffs into the lungs every 6 (six) hours as needed for wheezing or shortness of breath.   Yes [provider]  amLODipine (NORVASC) 10 MG tablet Take 10 mg by mouth 2 (two) times daily.   Yes [provider]  aspirin EC  81 MG tablet Take 81 mg by mouth daily. Swallow whole.   Yes [provider]  B Complex-C (B-COMPLEX WITH VITAMIN C) tablet Take 1 tablet by mouth daily.   Yes [provider]  budesonide-formoterol (SYMBICORT) 160-4.5 MCG/ACT inhaler Inhale 2 puffs then rinse mouth, twice daily 09/25/20  Yes Chianese, Clinton D, MD  calcitRIOL (ROCALTROL) 0.25 MCG capsule Take 0.25 mcg by mouth daily.   Yes [provider]  carvedilol (COREG) 6.25 MG tablet Take 6.25 mg by mouth at bedtime.   Yes [provider]  cetirizine (ZYRTEC) 10 MG tablet Take 10 mg by mouth daily.   Yes [provider]  cloNIDine (CATAPRES - DOSED IN MG/24 HR) 0.3 mg/24hr patch Place 0.3 mg onto the skin once a week.   Yes [provider]  ferrous sulfate 324 MG TBEC Take 324 mg by mouth daily.   Yes [provider]  hydrALAZINE (APRESOLINE) 25 MG  tablet Take 50 mg by mouth 3 (three) times daily.   Yes [provider]  levothyroxine (SYNTHROID) 112 MCG tablet Take 224 mcg by mouth daily before breakfast.   Yes [provider]  LORazepam (ATIVAN) 1 MG tablet Take 1 mg by mouth every 4 (four) hours as needed. 06/22/20  Yes [provider]  Multiple Vitamin (MULTIVITAMIN WITH MINERALS) TABS tablet Take 1 tablet by mouth daily.   Yes [provider]  Multiple Vitamins-Minerals (PRESERVISION AREDS) CAPS Take 1 tablet by mouth daily.   Yes [provider]  Omega-3 Fatty Acids (FISH OIL) 500 MG CAPS Take 1 capsule by mouth daily at 12 noon.   Yes [provider]  Propylene Glycol (SYSTANE COMPLETE) 0.6 % SOLN Apply to eye.   Yes [provider]  torsemide (DEMADEX) 100 MG tablet Take 100 mg by mouth daily. Alternate 135m QOD and 1577mQOD   Yes [provider]  zolpidem (AMBIEN) 10 MG tablet Take 10 mg by mouth at bedtime as needed for sleep.   Yes [provider]     Vitals:   09/30/20 1130 09/30/20 1230 09/30/20 1244 09/30/20 1324  BP: (!) 160/81 (!) 157/79  (!) 159/78  Pulse: 62 62  60  Resp: (!) _0 Temp:   98 F (36.7 C) 98.2 F (36.8 C)  TempSrc:   Oral Oral  SpO2: 100% 100%  100%   Exam Gen alert, no distress No rash, cyanosis or gangrene Sclera anicteric, throat clear  No jvd or bruits Chest crackles R base, L clear, no ^wob RRR no MRG Abd soft ntnd no mass or ascites +bs GU normal MS no joint effusions or deformity Ext diffuse 2+ pitting bilat pretib edema, 1+ thigh edema Neuro is alert, Ox 3 , nf, no asterixis       Date                           Creat               eGFR   2008- 11                    1.5- 1.9               2012- 14                    1.70- 2.15        28   2018- 19  2.35- 2.71        20- 24   2020                          2.82- 2.93        18- 19   Jan- jun 2021            2.62- 3.22        17-  21   Dec 08, 2019               3.23                 19 Jan 2020                 3.40- 3.70    March 2022  3.91- 4.28 Jun 2020  9.36- 9.80    09/19/20  3.87    Today 09/30/20 3.86  12 ml/min , CKD V     Assessment/ Plan: CKD V - b/l creat from late 2021- 2022 is 3.70- 4.25, eGFR 10-14. Had AKI in May w/ creat 9 and was admitted to [facility], but she "got better" and is now admitted w/ her usual CKD V, creat 3.8 and volume overload.  Needs diuresis w/ high dose IV lasix.  She has refused dialysis in general, f/b Dr Johnney Ou.  Plan IV lasix + po metolazone, I/O's and daily wts.  Follow exam, I/O's and symptoms. Will follow.  DM2 HTN - on 4 bp lowering meds at home Anemia ckd - she is getting esa shots every 2 wks, next due this Tuesday, she is a Jehovah Witness. Hb here is 7.6.  will follow.       Kelly Splinter  MD 09/30/2020, 2:11 PM  Recent Labs  Lab 09/29/20 1525 09/30/20 0200  WBC 4.7 4.4  HGB 7.3* 7.6*   Recent Labs  Lab 09/29/20 1525 09/30/20 1106  K 4.7 4.7  BUN 114* 105*  CREATININE 3.67* 3.86*  CALCIUM 9.6 9.3

## 2020-09-30 NOTE — H&P (Signed)
History and Physical    Tiffany [name] UMP:[id number] DOB: [birth year] DOA: 09/29/2020  PCP: Nolene Ebbs, MD  Patient coming from: Home  I have personally briefly reviewed patient's old medical records in [place]  Chief Complaint: Chest pain and shortness of breath  HPI: Tiffany [name] is a 70 y.o. 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  She reports that for the past 3 to 4 weeks she has noticed a mid sternal chest pain that radiates to her back.  She notes this pain during exertion and also causes her to have shortness of breath.  Also feels dizzy due to her shortness of breath.  No nausea, vomiting or diarrhea.  She reports that on 8/22 she had follow-up with pulmonology and had a chest x-ray showing cardiomegaly with pulmonary edema.  States she went home and increased her torsemide for 2 days on her own.  She also states she had an infusion on 8/16 of what sounds to be Epogen.  She has previously declined starting hemodialysis.  States she recently was seen at [facility] to get a second opinion.  States she might be open to peritoneal dialysis if eligible.  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  ED Course: She was noted to be afebrile, hypertensive to systolic of 400Q and mildly bradycardic at 58.  No leukocytosis.  Hemoglobin of 7.3 from a prior of 10.  Platelet of 142.  Creatinine of 3.67 which is around her baseline.  BG of 159.  Troponin is flat at 66-69.  EKG without any significant ST or T wave changes.  BNP of 470.  Negative fecal occult blood test.  Chest x-ray shows improvement in pulmonary edema from prior.  Patient is stable on room air at rest but was dyspneic when ambulated in the ER so hospitalist was called for admission for  observation.  Review of Systems Constitutional: No Weight Change, No Fever ENT/Mouth: No sore throat, No Rhinorrhea Eyes: No Eye Pain, No Vision Changes Cardiovascular: + Chest Pain, + SOB, No PND, + Dyspnea on Exertion, No Orthopnea, + Edema,  Respiratory: No Cough, No Sputum Gastrointestinal: No Nausea, No Vomiting, No Diarrhea, No Constipation, No Pain Genitourinary: no Urinary Incontinence, No Urgency, No Flank Pain Musculoskeletal: No Arthralgias, No Myalgias Skin: No Skin Lesions, No Pruritus, Neuro: no Weakness, No Numbness Psych: No Anxiety/Panic, No Depression, no decrease appetite Heme/Lymph: No Bruising, No Bleeding  Past Medical History:  Diagnosis Date   ALLERGIC RHINITIS    Arthritis    CHF (congestive heart failure) (HCC)    Chronic diastolic heart failure (HCC) 05/05/2013   Echo 03/2018:  EF 67-61, +diastolic dysfunction, GLS -20.5%, normal RVSF, RVSP 42.5 (mild elevated), severe LAE, small pericardial eff, mild MAc, mod AV calc, mild PI   CKD (chronic kidney disease)    Diabetes mellitus    Dyspnea    Heart murmur    at birth   Hyperlipidemia    Hypertension    Hypothyroid    Iron deficiency anemia    Obesity    OSA on CPAP    Previous back surgery    x 2   Refusal of blood transfusions as patient is Jehovah's Witness    S/P arthroscopy     Past Surgical History:  Procedure Laterality Date  BREAST BIOPSY  20+ yrs ago   dont know which breast per pt; benign   BREAST REDUCTION SURGERY  1982   KNEE SURGERY     x 2   LUMBAR DISC SURGERY     x 2   REDUCTION MAMMAPLASTY Bilateral 1985   TOE SURGERY     TOTAL ABDOMINAL HYSTERECTOMY  1997   partial hysterectomy- still has ovaries   TOTAL KNEE ARTHROPLASTY Left 07/01/2019   Procedure: TOTAL KNEE ARTHROPLASTY;  Surgeon: Paralee Cancel, MD;  Location: [facility];  Service: Orthopedics;  Laterality: Left;  70 min NO BLOOD PRODUCTS!!   TREATMENT FISTULA ANAL       reports that she has never smoked. She has never  used smokeless tobacco. She reports current alcohol use. She reports that she does not use drugs. Social History  No Known Allergies  Family History  Problem Relation Age of Onset   Prostate cancer Father    Congestive Heart Failure Mother    Diabetes Sister    Other Sister        septis   Breast cancer Neg Hx      Prior to Admission medications   Medication Sig Start Date End Date Taking? Authorizing Provider  albuterol (VENTOLIN HFA) 108 (90 Base) MCG/ACT inhaler Inhale 2 puffs into the lungs every 6 (six) hours as needed for wheezing or shortness of breath.   Yes [provider]  amLODipine (NORVASC) 10 MG tablet Take 10 mg by mouth 2 (two) times daily.   Yes [provider]  aspirin EC 81 MG tablet Take 81 mg by mouth daily. Swallow whole.   Yes [provider]  B Complex-C (B-COMPLEX WITH VITAMIN C) tablet Take 1 tablet by mouth daily.   Yes [provider]  budesonide-formoterol (SYMBICORT) 160-4.5 MCG/ACT inhaler Inhale 2 puffs then rinse mouth, twice daily 09/25/20  Yes Duey, Clinton D, MD  calcitRIOL (ROCALTROL) 0.25 MCG capsule Take 0.25 mcg by mouth daily.   Yes [provider]  carvedilol (COREG) 6.25 MG tablet Take 6.25 mg by mouth at bedtime.   Yes [provider]  cetirizine (ZYRTEC) 10 MG tablet Take 10 mg by mouth daily.   Yes [provider]  cloNIDine (CATAPRES - DOSED IN MG/24 HR) 0.3 mg/24hr patch Place 0.3 mg onto the skin once a week.   Yes [provider]  ferrous sulfate 324 MG TBEC Take 324 mg by mouth daily.   Yes [provider]  hydrALAZINE (APRESOLINE) 25 MG tablet Take 50 mg by mouth 3 (three) times daily.   Yes [provider]  levothyroxine (SYNTHROID) 112 MCG tablet Take 224 mcg by mouth daily before breakfast.   Yes [provider]  LORazepam (ATIVAN) 1 MG tablet Take 1 mg by mouth every 4 (four) hours as needed. 06/22/20  Yes [provider]   Multiple Vitamin (MULTIVITAMIN WITH MINERALS) TABS tablet Take 1 tablet by mouth daily.   Yes [provider]  Multiple Vitamins-Minerals (PRESERVISION AREDS) CAPS Take 1 tablet by mouth daily.   Yes [provider]  Omega-3 Fatty Acids (FISH OIL) 500 MG CAPS Take 1 capsule by mouth daily at 12 noon.   Yes [provider]  Propylene Glycol (SYSTANE COMPLETE) 0.6 % SOLN Apply to eye.   Yes [provider]  torsemide (DEMADEX) 100 MG tablet Take 100 mg by mouth daily. Alternate 152m QOD and 1551mQOD   Yes [provider]  zolpidem (AMBIEN) 10 MG tablet Take  10 mg by mouth at bedtime as needed for sleep.   Yes [provider]    Physical Exam: Vitals:   09/29/20 1505 09/29/20 1942 09/29/20 2105 09/29/20 2309  BP: (!) 173/92 (!) 196/97 (!) 173/86 (!) 156/93  Pulse: 60 (!) 58 (!) 53 (!) 58  Resp: (!) _0 Temp: 98.4 F (36.9 C)     TempSrc: Oral     SpO2: 99% 100% 100% 100%    Constitutional: NAD, calm, comfortable, well-appearing elderly female younger than her stated age laying at proximately 30 degree incline in Vitals:   09/29/20 1459 09/29/20 1942 09/29/20 2105 09/29/20 2309  BP: (!) 173/92 (!) 196/97 (!) 173/86 (!) 156/93  Pulse: 60 (!) 58 (!) 53 (!) 58  Resp: (!) _1 Temp: 98.4 F (36.9 C)     TempSrc: Oral     SpO2: 99% 100% 100% 100%   Eyes: PERRL, lids and conjunctivae normal ENMT: Mucous membranes are moist.  Neck: normal, supple Respiratory: clear to auscultation bilaterally, no wheezing, no crackles. Normal respiratory effort. No accessory muscle use.  Cardiovascular: Regular rate and rhythm, no murmurs / rubs / gallops.  2+ bilateral edema of the lower extremities.   Abdomen: no tenderness, no masses palpated. Bowel sounds positive.  Musculoskeletal: no clubbing / cyanosis. No joint deformity upper and lower extremities. Good ROM, no contractures. Normal muscle tone.  Pain with palpation of  bilateral lower scapula Skin: no rashes, lesions, ulcers. No induration Neurologic: CN 2-12 grossly intact. Sensation intact,Strength 5/5 in all 4.  Psychiatric: Normal judgment and insight. Alert and oriented x 3. Normal mood.     Labs on Admission: I have personally reviewed following labs and imaging studies  CBC: Recent Labs  Lab 09/29/20 1525  WBC 4.7  NEUTROABS 3.1  HGB 7.3*  HCT 23.2*  MCV 89.9  PLT 697*   Basic Metabolic Panel: Recent Labs  Lab 09/29/20 1525  NA 144  K 4.7  CL 111  CO2 21*  GLUCOSE 159*  BUN 114*  CREATININE 3.67*  CALCIUM 9.6   GFR: Estimated Creatinine Clearance: 19.8 mL/min (A) (by C-G formula based on SCr of 3.67 mg/dL (H)). Liver Function Tests: No results for input(s): AST, ALT, ALKPHOS, BILITOT, PROT, ALBUMIN in the last 168 hours. No results for input(s): LIPASE, AMYLASE in the last 168 hours. No results for input(s): AMMONIA in the last 168 hours. Coagulation Profile: No results for input(s): INR, PROTIME in the last 168 hours. Cardiac Enzymes: No results for input(s): CKTOTAL, CKMB, CKMBINDEX, TROPONINI in the last 168 hours. BNP (last 3 results) Recent Labs    01/25/20 1506  PROBNP 2,507*   HbA1C: No results for input(s): HGBA1C in the last 72 hours. CBG: No results for input(s): GLUCAP in the last 168 hours. Lipid Profile: No results for input(s): CHOL, HDL, LDLCALC, TRIG, CHOLHDL, LDLDIRECT in the last 72 hours. Thyroid Function Tests: No results for input(s): TSH, T4TOTAL, FREET4, T3FREE, THYROIDAB in the last 72 hours. Anemia Panel: No results for input(s): VITAMINB12, FOLATE, FERRITIN, TIBC, IRON, RETICCTPCT in the last 72 hours. Urine analysis:    Component Value Date/Time   COLORURINE YELLOW 04/17/2020 1222   APPEARANCEUR CLEAR 04/17/2020 1222   LABSPEC 1.013 04/17/2020 1222   PHURINE 5.0 04/17/2020 1222   GLUCOSEU NEGATIVE 04/17/2020 1222   HGBUR NEGATIVE 04/17/2020 1222   BILIRUBINUR NEGATIVE 04/17/2020  [place] 04/17/2020 1222   PROTEINUR 100 (A) 04/17/2020 1222  UROBILINOGEN 0.2 02/25/2019 1939   NITRITE NEGATIVE 04/17/2020 1222   LEUKOCYTESUR NEGATIVE 04/17/2020 1222    Radiological Exams on Admission: DG Chest 2 View  Result Date: 09/29/2020 CLINICAL DATA:  Chest pain, shortness of breath EXAM: CHEST - 2 VIEW COMPARISON:  Chest radiograph 09/25/2020 FINDINGS: The heart is enlarged, unchanged. The mediastinal contours are stable. There is calcified atherosclerotic plaque of the aortic arch. There is vascular congestion without frank pulmonary edema, improved compared to the prior study. There is no focal consolidation. There is no pleural effusion or pneumothorax. There is no acute osseous abnormality. IMPRESSION: Unchanged cardiomegaly with improved pulmonary edema compared to the study from 09/25/2020. Electronically Signed   By: Valetta Mole M.D.   On: 09/29/2020 15:56      Assessment/Plan  Symptomatic anemia -Likely secondary to worsening anemia of CKD.  Recently had iron panel showing only anemia of chronic disease. FOBT negative.  -obtain vitamin B12 and folate lab - Patient has declined any blood products due to her being a Jehovah's Witness.  Have discussion with her regarding the dangers of her severe anemia and possibility of going into heart failure or cardiac arrest.  Patient verbalizes understanding. - Has recently just received Epogen on 8/16 and states she has another one scheduled on 8/30 since she gets them routinely every 2 weeks. Will need to consult nephrology in the morning.  Chest pain likely secondary to symptomatic anemia - Troponin is flat at 69-66.  Troponin is elevated likely due to CKD and demand ischemia.  EKG is reassuring without any new ST or T wave changes  CKD stage 5 -pt has refused HD in the past. States currently she is getting a second opinion at [facility] and is awaiting their decision.  Might consider peritoneal dialysis if  eligible - Creatinine currently stable around her baseline at 6.75  Chronic diastolic heart failure - Repeat chest x-ray compared to prior shows improvement in pulmonary edema.  Patient does have 3+ edema of the lower extremity. -We will continue home diuretic.  Will not increase dosage at this time due to her anemia.  Hypertension - Not well controlled - Continue hydralazine, Coreg, amlodipine, clonidine patch  OSA - Continue CPAP  Hypothyroidism - Continue Synthroid  Type 2 diabetes -Very Sensitive sliding scale  DVT prophylaxis:.SCD  code Status: DNR Family Communication: Plan discussed with patient at bedside  disposition Plan: Home with observation Consults called:  Admission status: Observation    Level of care: Telemetry  Status is: Observation  The patient remains OBS appropriate and will d/c before 2 midnights.  Dispo: The patient is from: Home              Anticipated d/c is to: Home              Patient currently is not medically stable to d/c.   Difficult to place patient No         Orene Desanctis DO [facility]   If 7PM-7AM, please contact night-coverage [email or web address]   09/30/2020, 1:00 AM

## 2020-10-01 DIAGNOSIS — E8770 Fluid overload, unspecified: Secondary | ICD-10-CM

## 2020-10-01 LAB — CBC WITH DIFFERENTIAL/PLATELET
Abs Immature Granulocytes: 0.01 K/uL (ref 0.00–0.07)
Basophils Absolute: 0 K/uL (ref 0.0–0.1)
Basophils Relative: 0 %
Eosinophils Absolute: 0.1 K/uL (ref 0.0–0.5)
Eosinophils Relative: 2 %
HCT: 26.9 % — ABNORMAL LOW (ref 36.0–46.0)
Hemoglobin: 8.3 g/dL — ABNORMAL LOW (ref 12.0–15.0)
Immature Granulocytes: 0 %
Lymphocytes Relative: 19 %
Lymphs Abs: 1 K/uL (ref 0.7–4.0)
MCH: 27.9 pg (ref 26.0–34.0)
MCHC: 30.9 g/dL (ref 30.0–36.0)
MCV: 90.6 fL (ref 80.0–100.0)
Monocytes Absolute: 0.6 K/uL (ref 0.1–1.0)
Monocytes Relative: 11 %
Neutro Abs: 3.5 K/uL (ref 1.7–7.7)
Neutrophils Relative %: 68 %
Platelets: 156 K/uL (ref 150–400)
RBC: 2.97 MIL/uL — ABNORMAL LOW (ref 3.87–5.11)
RDW: 17.5 % — ABNORMAL HIGH (ref 11.5–15.5)
WBC: 5.2 K/uL (ref 4.0–10.5)
nRBC: 0 % (ref 0.0–0.2)

## 2020-10-01 LAB — BASIC METABOLIC PANEL
Anion gap: 12 (ref 5–15)
BUN: 98 mg/dL — ABNORMAL HIGH (ref 8–23)
CO2: 22 mmol/L (ref 22–32)
Calcium: 10 mg/dL (ref 8.9–10.3)
Chloride: 110 mmol/L (ref 98–111)
Creatinine, Ser: 3.66 mg/dL — ABNORMAL HIGH (ref 0.44–1.00)
GFR, Estimated: 13 mL/min — ABNORMAL LOW (ref >60)
Glucose, Bld: 142 mg/dL — ABNORMAL HIGH (ref 70–99)
Potassium: 4.3 mmol/L (ref 3.5–5.1)
Sodium: 144 mmol/L (ref 135–145)

## 2020-10-01 LAB — GLUCOSE, CAPILLARY
Glucose-Capillary: 130 mg/dL — ABNORMAL HIGH (ref 70–99)
Glucose-Capillary: 138 mg/dL — ABNORMAL HIGH (ref 70–99)
Glucose-Capillary: 139 mg/dL — ABNORMAL HIGH (ref 70–99)
Glucose-Capillary: 157 mg/dL — ABNORMAL HIGH (ref 70–99)
Glucose-Capillary: 167 mg/dL — ABNORMAL HIGH (ref 70–99)

## 2020-10-01 NOTE — Progress Notes (Signed)
[facility] Progress Note  Subjective: seen in room, about 1500 UOP overnigt and this am, not all recorded, pt is recording. Feeling okay today, no c/o.    Vitals:   10/01/20 0432 10/01/20 0433 10/01/20 0821 10/01/20 1352  BP:   (!) 153/73 131/72  Pulse:    64  Resp:    16  Temp:    98.1 F (36.7 C)  TempSrc:    Oral  SpO2:    100%  Weight: 112.5 kg 112.5 kg    Height:        Exam:  alert, nad   no jvd  Chest cta bilat  Cor reg no RG  Abd soft ntnd no ascites   Ext 2+ bilat pretib edema   Alert, NF, ox3     Date                           Creat               eGFR   2008- 11                    1.5- 1.9               2012- 14                    1.70- 2.15        28   2018- 19                    2.35- 2.71        20- 24   2020                          2.82- 2.93        18- 19   Jan- jun 2021            2.62- 3.22        17- 21   Dec 08, 2019               3.23                 19 Jan 2020                  3.40- 3.70    March 2022              3.91- 4.28 Jun 2020                 9.36- 9.80 Sent to hospice, then improved    09/19/20                     3.87    09/30/20           3.86                 12 ml/min , CKD V         Assessment/ Plan: CKD V - b/l creat from late 2021- 2022 is 3.70- 4.25, eGFR 10-14. Had AKI in May w/ creat 9 and was admitted to [facility], but pt  "got better" and is now admitted w/ usual CKD V, creat 3.8 and volume overload w/ LE edema and DOE.  Diuresing w/ high dose IV lasix.  She has refused  dialysis in general, f/b Dr Johnney Ou.  Not all UOP recorded, good UOP per patient. Continue IV lasix 100 tid w/ po metolazone 5. Creat stable. Needs further diuresis. Will follow.  DM2 HTN - on 4 bp lowering meds at home Anemia ckd - she is getting esa shots every 2 wks, next due this Tuesday, she is a Jehov[employer] Witness. Hb here 7.6 > 8.6 today , likely Hb was low due to vol overload , now improving w/ diuresis.          Rob  Doctor, hospital 10/01/2020, 2:54 PM   Recent Labs  Lab 09/30/20 0200 09/30/20 1106 10/01/20 0743  K  --  4.7 4.3  BUN  --  105* 98*  CREATININE  --  3.86* 3.66*  CALCIUM  --  9.3 10.0  HGB 7.6*  --  8.3*   Inpatient medications:  amLODipine  10 mg Oral BID   aspirin EC  81 mg Oral Daily   calcitRIOL  0.25 mcg Oral Daily   carvedilol  6.25 mg Oral QHS   cloNIDine  0.3 mg Transdermal Weekly   ferrous sulfate  324 mg Oral Daily   hydrALAZINE  50 mg Oral TID   insulin aspart  0-6 Units Subcutaneous TID WC   levothyroxine  224 mcg Oral Q0600   loratadine  10 mg Oral Daily   metolazone  5 mg Oral Q24H   mometasone-formoterol  2 puff Inhalation BID   omega-3 acid ethyl esters  1 g Oral Daily    furosemide 100 mg (10/01/20 0820)   albuterol, LORazepam, zolpidem

## 2020-10-01 NOTE — Progress Notes (Signed)
CPAP set up done for pt. Pt is using her tubing and mask. Pt prefer self placement. Advised to let the RN know if help needed.

## 2020-10-01 NOTE — Evaluation (Signed)
Physical Therapy Evaluation Patient Details Name: Tiffany [name] MRN: [id number] DOB: [birth year] Today's Date: 10/01/2020   History of Present Illness  Pt is a 70 y.o. female admitted 8/26 for symptomatic anemia. She presented to the ED with concerns of chest pain and shortness of breath. PMH significant for ESRD not yet on HD, chronic diastolic heart failure, hypertension, OSA on CPAP, asthma, hypothyroidism and type 2 diabetes.   Clinical Impression  PT eval complete. Pt is independent with functional mobility. Pt reports no concerns regarding mobility at home. 1/4 DOE with ambulation. SpO2 100% on RA. HR in 90s. No further PT intervention indicated. PT signing off.    Follow Up Recommendations No PT follow up    Equipment Recommendations  None recommended by PT    Recommendations for Other Services       Precautions / Restrictions Precautions Precautions: None      Mobility  Bed Mobility Overal bed mobility: Independent                  Transfers Overall transfer level: Independent Equipment used: None                Ambulation/Gait Ambulation/Gait assistance: Independent Gait Distance (Feet): 65 Feet Assistive device: None Gait Pattern/deviations: Step-through pattern;Decreased stride length Gait velocity: decreased Gait velocity interpretation: 1.31 - 2.62 ft/sec, indicative of limited community ambulator General Gait Details: 1/4 DOE. HR in 90s. SpO2 100% on RA.  Stairs Stairs:  (Pt declined need for stair training. Reports no concerns.)          Wheelchair Mobility    Modified Rankin (Stroke Patients Only)       Balance Overall balance assessment: Mild deficits observed, not formally tested                                           Pertinent Vitals/Pain Pain Assessment: No/denies pain    Home Living Family/patient expects to be discharged to:: Private residence Living Arrangements: Children (son) Available  Help at Discharge: Family;Available PRN/intermittently Type of Home: House Home Access: Stairs to enter Entrance Stairs-Rails: Right;Left;Can reach both Entrance Stairs-Number of Steps: 7 Home Layout: One level Home Equipment: Cane - single point;Walker - 2 wheels;Shower seat      Prior Function Level of Independence: Independent         Comments: Drives. Does her own cooking and grocery shopping.     Hand Dominance   Dominant Hand: Right    Extremity/Trunk Assessment   Upper Extremity Assessment Upper Extremity Assessment: Overall WFL for tasks assessed    Lower Extremity Assessment Lower Extremity Assessment: Overall WFL for tasks assessed    Cervical / Trunk Assessment Cervical / Trunk Assessment: Normal  Communication   Communication: No difficulties  Cognition Arousal/Alertness: Awake/alert Behavior During Therapy: WFL for tasks assessed/performed Overall Cognitive Status: Within Functional Limits for tasks assessed                                        General Comments General comments (skin integrity, edema, etc.): VSS on RA    Exercises     Assessment/Plan    PT Assessment Patent does not need any further PT services  PT Problem List         PT Treatment Interventions  PT Goals (Current goals can be found in the Care Plan section)  Acute Rehab PT Goals Patient Stated Goal: home PT Goal Formulation: All assessment and education complete, DC therapy    Frequency     Barriers to discharge        Co-evaluation               AM-PAC PT "6 Clicks" Mobility  Outcome Measure Help needed turning from your back to your side while in a flat bed without using bedrails?: None Help needed moving from lying on your back to sitting on the side of a flat bed without using bedrails?: None Help needed moving to and from a bed to a chair (including a wheelchair)?: None Help needed standing up from a chair using your arms (e.g.,  wheelchair or bedside chair)?: None Help needed to walk in hospital room?: None Help needed climbing 3-5 steps with a railing? : A Little 6 Click Score: 23    End of Session   Activity Tolerance: Patient tolerated treatment well Patient left: in bed;with call bell/phone within reach Nurse Communication: Mobility status PT Visit Diagnosis: Difficulty in walking, not elsewhere classified (R26.2)    Time: 8115-7262 PT Time Calculation (min) (ACUTE ONLY): 12 min   Charges:   PT Evaluation $PT Eval Low Complexity: 1 Low          Lorrin Goodell, PT  Office # [phone] Pager [phone]   Lorriane Shire 10/01/2020, 9:10 AM

## 2020-10-01 NOTE — Progress Notes (Signed)
Patient ID: Tiffany [name], female   DOB: May 27, 1950, 70 y.o.   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  On presentation, hemoglobin was 7.3 (baseline around 9-10) with platelets of 142 and creatinine of 3.67.  Troponin was 66 and 69.  EKG without any significant ST or T wave changes.  BNP of 470.  Chest x-ray showed improvement in pulmonary edema from prior.  Assessment & Plan:   Symptomatic anemia/anemia of chronic disease -Possibly from chronic kidney disease -Recently had iron panel which showed anemia of chronic disease.  FOBT negative.  She had recently received Epogen on 09/19/2020 and apparently is scheduled for another 1 on 10/03/2020 -Patient is a Jehovah's Witness and does not want blood products. -Hemoglobin 8.3 today.  Chest pain, likely secondary to symptomatic anemia -chronic diastolic heart failure, hypertension, OSA on CPAP, asthma, hypothyroidism and type 2 diabetes.  Outpatient follow-up with cardiology  Chronic diastolic heart failure Volume overload  -Chest x-ray on presentation showed improvement in pulmonary edema compared to chest x-ray done on 09/25/2020. - Strict input and output.  Daily weights.  Fluid restriction.   -Nephrology following.  Oral torsemide has been switched to IV Lasix for now.  Chronic kidney disease stage V -Creatinine 3.67 on presentation.  Creatinine 3.86 on 09/30/2020.  Creatinine pending for today.  - Diuretic plan as above.  Follow nephrology recommendations.  Hypertension -Blood pressure still elevated.  Continue amlodipine, Coreg, clonidine, hydralazine and torsemide  OSA -Continue  CPAP  Hypothyroidism -Continue Synthroid  Diabetes mellitus type 2 -A1c 5.8.  Continues CBGs with SSI  Generalized conditioning -Patient was discharged to residential hospice in May 2022 but subsequently discharged from residential hospice because of improvement in general condition. -Patient is still chronically ill and deconditioned.  Will consult palliative care for goals of care discussion. -PT eval  DVT prophylaxis: SCDs Code Status: DNR Family Communication: None at bedside Disposition Plan: Status is: Inpatient because: Inpatient level of care appropriate due to severity of illness  Dispo: The patient is from: Home              Anticipated d/c is to: Home              Patient currently is not medically stable to d/c.   Difficult to place patient No  Consultants: Nephrology/palliative care  Procedures: None  Antimicrobials: None   Subjective: Patient seen and examined at bedside.  Denies any current chest pain.  Still short of breath with minimal exertion.  No overnight fever, vomiting reported. Objective: Vitals:   10/01/20 0103 10/01/20 0417 10/01/20 0432 10/01/20 0433  BP: (!) 142/64 (!) 159/75    Pulse: 68 71    Resp: 16 16    Temp: 99.5 F (37.5 C) 97.9 F (36.6 C)    TempSrc: Oral Oral    SpO2: 94% 100%    Weight:   112.5 kg 112.5 kg  Height:        Intake/Output Summary (Last 24 hours) at 10/01/2020 0820 Last data filed at 10/01/2020 0106 Gross per 24 hour  Intake 230 ml  Output 500 ml  Net -270 ml   Filed Weights   10/01/20 0432  10/01/20 0433  Weight: 112.5 kg 112.5 kg    Examination:  General exam: On room air currently.  No distress.  Looks chronically ill and deconditioned. Respiratory system: Decreased breath sounds at bases bilaterally with basilar crackles  cardiovascular system: Currently rate controlled; S1-S2 heard gastrointestinal system: Abdomen is distended slightly, soft and nontender.  Bowel sounds are heard  extremities:  Bilateral lower extremity edema present; no clubbing Central nervous system: Awake and alert.  No focal neurological deficits.  Moves extremities  skin: No obvious ecchymosis/rashes Psychiatry: Affect is mostly flat   Data Reviewed: I have personally reviewed following labs and imaging studies  CBC: Recent Labs  Lab 09/29/20 1525 09/30/20 0200 10/01/20 0743  WBC 4.7 4.4 5.2  NEUTROABS 3.1  --  3.5  HGB 7.3* 7.6* 8.3*  HCT 23.2* 24.3* 26.9*  MCV 89.9 90.7 90.6  PLT 142* 151 161    Basic Metabolic Panel: Recent Labs  Lab 09/29/20 1525 09/30/20 1106  NA 144 140  K 4.7 4.7  CL 111 110  CO2 21* 20*  GLUCOSE 159* 184*  BUN 114* 105*  CREATININE 3.67* 3.86*  CALCIUM 9.6 9.3    GFR: Estimated Creatinine Clearance: 18.7 mL/min (A) (by C-G formula based on SCr of 3.86 mg/dL (H)). Liver Function Tests: No results for input(s): AST, ALT, ALKPHOS, BILITOT, PROT, ALBUMIN in the last 168 hours. No results for input(s): LIPASE, AMYLASE in the last 168 hours. No results for input(s): AMMONIA in the last 168 hours. Coagulation Profile: No results for input(s): INR, PROTIME in the last 168 hours. Cardiac Enzymes: No results for input(s): CKTOTAL, CKMB, CKMBINDEX, TROPONINI in the last 168 hours. BNP (last 3 results) Recent Labs    01/25/20 1506  PROBNP 2,507*    HbA1C: Recent Labs    09/30/20 0117  HGBA1C 5.8*    CBG: Recent Labs  Lab 09/30/20 1244 09/30/20 1630 09/30/20 2055 10/01/20 0013 10/01/20 0806  GLUCAP 163* 135* 151* 139* 157*    Lipid Profile: No results for input(s): CHOL, HDL, LDLCALC, TRIG, CHOLHDL, LDLDIRECT in the last 72 hours. Thyroid Function Tests: No results for input(s): TSH, T4TOTAL, FREET4, T3FREE, THYROIDAB in the last 72 hours. Anemia Panel: Recent Labs    09/30/20 0103  VITAMINB12 >7,500*  FOLATE 12.7    Sepsis Labs: No results for input(s): PROCALCITON, LATICACIDVEN in the last 168 hours.  Recent Results (from the past 240  hour(s))  SARS CORONAVIRUS 2 (TAT 6-24 HRS) Nasopharyngeal Nasopharyngeal Swab     Status: None   Collection Time: 09/30/20 12:03 AM   Specimen: Nasopharyngeal Swab  Result Value Ref Range Status   SARS Coronavirus 2 NEGATIVE NEGATIVE Final    Comment: (NOTE) SARS-CoV-2 target nucleic acids are NOT DETECTED.  The SARS-CoV-2 RNA is generally detectable in upper and lower respiratory specimens during the acute phase of infection. Negative results do not preclude SARS-CoV-2 infection, do not rule out co-infections with other pathogens, and should not be used as the sole basis for treatment or other patient management decisions. Negative results must be combined with clinical observations, patient history, and epidemiological information. The expected result is Negative.  Fact Sheet for Patients: SugarRoll.be  Fact Sheet for Healthcare Providers: https://www.woods-mathews.com/  This test is not yet approved or cleared by the [place] FDA and  has been authorized for detection and/or diagnosis of SARS-CoV-2 by FDA under an Emergency Use Authorization (EUA). This EUA will remain  in effect (meaning this test can be used) for the duration of  the COVID-19 declaration under Se ction 564(b)(1) of the Act, 21 U.S.C. section 360bbb-3(b)(1), unless the authorization is terminated or revoked sooner.  Performed at [facility], [place] [address], [place], [place] [address]           Radiology Studies: DG Chest 2 View  Result Date: 09/29/2020 CLINICAL DATA:  Chest pain, shortness of breath EXAM: CHEST - 2 VIEW COMPARISON:  Chest radiograph 09/25/2020 FINDINGS: The heart is enlarged, unchanged. The mediastinal contours are stable. There is calcified atherosclerotic plaque of the aortic arch. There is vascular congestion without frank pulmonary edema, improved compared to the prior study. There is no focal consolidation. There is no pleural  effusion or pneumothorax. There is no acute osseous abnormality. IMPRESSION: Unchanged cardiomegaly with improved pulmonary edema compared to the study from 09/25/2020. Electronically Signed   By: Valetta Mole M.D.   On: 09/29/2020 15:56        Scheduled Meds:  amLODipine  10 mg Oral BID   aspirin EC  81 mg Oral Daily   calcitRIOL  0.25 mcg Oral Daily   carvedilol  6.25 mg Oral QHS   cloNIDine  0.3 mg Transdermal Weekly   ferrous sulfate  324 mg Oral Daily   hydrALAZINE  50 mg Oral TID   insulin aspart  0-6 Units Subcutaneous TID WC   levothyroxine  224 mcg Oral Q0600   loratadine  10 mg Oral Daily   metolazone  5 mg Oral Q24H   mometasone-formoterol  2 puff Inhalation BID   omega-3 acid ethyl esters  1 g Oral Daily   Continuous Infusions:  furosemide 100 mg (10/01/20 0106)          Aline August, MD [facility] 10/01/2020, 8:20 AM

## 2020-10-02 DIAGNOSIS — Z7189 Other specified counseling: Secondary | ICD-10-CM

## 2020-10-02 DIAGNOSIS — Z515 Encounter for palliative care: Secondary | ICD-10-CM

## 2020-10-02 DIAGNOSIS — R0602 Shortness of breath: Secondary | ICD-10-CM

## 2020-10-02 LAB — BASIC METABOLIC PANEL
Anion gap: 11 (ref 5–15)
BUN: 111 mg/dL — ABNORMAL HIGH (ref 8–23)
CO2: 22 mmol/L (ref 22–32)
Calcium: 8.9 mg/dL (ref 8.9–10.3)
Chloride: 108 mmol/L (ref 98–111)
Creatinine, Ser: 4.1 mg/dL — ABNORMAL HIGH (ref 0.44–1.00)
GFR, Estimated: 11 mL/min — ABNORMAL LOW (ref >60)
Glucose, Bld: 128 mg/dL — ABNORMAL HIGH (ref 70–99)
Potassium: 4.1 mmol/L (ref 3.5–5.1)
Sodium: 141 mmol/L (ref 135–145)

## 2020-10-02 LAB — GLUCOSE, CAPILLARY
Glucose-Capillary: 161 mg/dL — ABNORMAL HIGH (ref 70–99)
Glucose-Capillary: 147 mg/dL — ABNORMAL HIGH (ref 70–99)
Glucose-Capillary: 146 mg/dL — ABNORMAL HIGH (ref 70–99)
Glucose-Capillary: 191 mg/dL — ABNORMAL HIGH (ref 70–99)

## 2020-10-02 NOTE — Progress Notes (Signed)
[facility] ROUNDING NOTE   Subjective:   Interval History: This is a 70 year old lady CKD 5 not currently on dialysis diabetes type mellitus type II Jehovah's Witness.  She was admitted for symptomatic anemia and volume overload with congestive heart failure and diastolic dysfunction 0/17/7939.  She had an episode of acute kidney injury in May 2022 with a creatinine increased to 9 and was admitted to [facility].  Her creatinine appears to be about 3.8 mg/dL.  She appears to be diuresing with IV Lasix.  She has refused dialysis.  Blood pressure 132/54 pulse 79 temperature 98.2 O2 sats 100%  Sodium 141 potassium 4 chloride 108 CO2 22 BUN 111 creatinine 4.1 glucose 128 hemoglobin 8.3  Urine output 2.4 L 10/01/2020  Objective:  Vital signs in last 24 hours:  Temp:  [98.1 F (36.7 C)-98.7 F (37.1 C)] 98.3 F (36.8 C) (08/29 0442) Pulse Rate:  [57-64] 64 (08/29 0442) Resp:  [16] 16 (08/29 0442) BP: (131-153)/(54-73) 132/54 (08/29 0442) SpO2:  [99 %-100 %] 100 % (08/29 0442) Weight:  [111.5 kg] 111.5 kg (08/29 0447)  Weight change: -1 kg Filed Weights   10/01/20 0432 10/01/20 0433 10/02/20 0447  Weight: 112.5 kg 112.5 kg 111.5 kg    Intake/Output: I/O last 3 completed shifts: In: 650 [P.O.:600; IV Piggyback:50] Out: 0300 [Urine:3700]   Intake/Output this shift:  No intake/output data recorded.  CVS- RRR RS- CTA ABD- BS present soft non-distended EXT-2+ edema   Basic Metabolic Panel: Recent Labs  Lab 09/29/20 1525 09/30/20 1106 10/01/20 0743 10/02/20 0457  NA 144 140 144 141  K 4.7 4.7 4.3 4.1  CL 111 110 110 108  CO2 21* 20* 22 22  GLUCOSE 159* 184* 142* 128*  BUN 114* 105* 98* 111*  CREATININE 3.67* 3.86* 3.66* 4.10*  CALCIUM 9.6 9.3 10.0 8.9    Liver Function Tests: No results for input(s): AST, ALT, ALKPHOS, BILITOT, PROT, ALBUMIN in the last 168 hours. No results for input(s): LIPASE, AMYLASE in the last 168 hours. No results for  input(s): AMMONIA in the last 168 hours.  CBC: Recent Labs  Lab 09/29/20 1525 09/30/20 0200 10/01/20 0743  WBC 4.7 4.4 5.2  NEUTROABS 3.1  --  3.5  HGB 7.3* 7.6* 8.3*  HCT 23.2* 24.3* 26.9*  MCV 89.9 90.7 90.6  PLT 142* 151 156    Cardiac Enzymes: No results for input(s): CKTOTAL, CKMB, CKMBINDEX, TROPONINI in the last 168 hours.  BNP: Invalid input(s): POCBNP  CBG: Recent Labs  Lab 10/01/20 0013 10/01/20 0806 10/01/20 1236 10/01/20 1701 10/01/20 2034  GLUCAP 139* 157* 167* 130* 138*    Microbiology: Results for orders placed or performed during the hospital encounter of 09/29/20  SARS CORONAVIRUS 2 (TAT 6-24 HRS) Nasopharyngeal Nasopharyngeal Swab     Status: None   Collection Time: 09/30/20 12:03 AM   Specimen: Nasopharyngeal Swab  Result Value Ref Range Status   SARS Coronavirus 2 NEGATIVE NEGATIVE Final    Comment: (NOTE) SARS-CoV-2 target nucleic acids are NOT DETECTED.  The SARS-CoV-2 RNA is generally detectable in upper and lower respiratory specimens during the acute phase of infection. Negative results do not preclude SARS-CoV-2 infection, do not rule out co-infections with other pathogens, and should not be used as the sole basis for treatment or other patient management decisions. Negative results must be combined with clinical observations, patient history, and epidemiological information. The expected result is Negative.  Fact Sheet for Patients: SugarRoll.be  Fact Sheet for Healthcare Providers: https://www.woods-mathews.com/  This test is not yet approved or cleared by the [place] and  has been authorized for detection and/or diagnosis of SARS-CoV-2 by FDA under an Emergency Use Authorization (EUA). This EUA will remain  in effect (meaning this test can be used) for the duration of the COVID-19 declaration under Se ction 564(b)(1) of the Act, 21 U.S.C. section 360bbb-3(b)(1), unless the  authorization is terminated or revoked sooner.  Performed at [facility], [place] [address], [place], [place] [address]     Coagulation Studies: No results for input(s): LABPROT, INR in the last 72 hours.  Urinalysis: No results for input(s): COLORURINE, LABSPEC, PHURINE, GLUCOSEU, HGBUR, BILIRUBINUR, KETONESUR, PROTEINUR, UROBILINOGEN, NITRITE, LEUKOCYTESUR in the last 72 hours.  Invalid input(s): APPERANCEUR    Imaging: No results found.   Medications:    furosemide 100 mg (10/02/20 0022)    amLODipine  10 mg Oral BID   aspirin EC  81 mg Oral Daily   calcitRIOL  0.25 mcg Oral Daily   carvedilol  6.25 mg Oral QHS   cloNIDine  0.3 mg Transdermal Weekly   ferrous sulfate  324 mg Oral Daily   hydrALAZINE  50 mg Oral TID   insulin aspart  0-6 Units Subcutaneous TID WC   levothyroxine  224 mcg Oral Q0600   loratadine  10 mg Oral Daily   metolazone  5 mg Oral Q24H   mometasone-formoterol  2 puff Inhalation BID   omega-3 acid ethyl esters  1 g Oral Daily   albuterol, LORazepam, zolpidem  Assessment/ Plan:  CKD V - b/l creat from late 2021- 2022 is 3.70- 4.25, eGFR 10-14. Had AKI in May w/ creat 9 and was admitted to [facility], but pt  "got better" and is now admitted w/ usual CKD V, creat 3.8 and volume overload w/ LE edema and DOE.  Diuresing w/ high dose IV lasix.  She has refused dialysis in general, f/b Dr Johnney Ou.  Not all UOP recorded, good UOP per patient. Continue IV lasix 100 tid w/ po metolazone 5. Creat stable. Needs further diuresis. Will follow.  DM2 HTN - on 4 bp lowering meds at home Anemia ckd - she is getting esa shots every 2 wks, next due this Tuesday, she is a Jehovah Witness. Hb here 7.6 > 8.6 today , likely Hb was low due to vol overload , now improving w/ diuresis.     LOS: [place] @TODAY @8 :15 AM

## 2020-10-02 NOTE — Plan of Care (Signed)
  Problem: Education: Goal: Knowledge of General Education information will improve Description: Including pain rating scale, medication(s)/side effects and non-pharmacologic comfort measures Outcome: Progressing   Problem: Health Behavior/Discharge Planning: Goal: Ability to manage health-related needs will improve Outcome: Progressing   Problem: Clinical Measurements: Goal: Will remain free from infection Outcome: Progressing   Problem: Clinical Measurements: Goal: Respiratory complications will improve Outcome: Progressing   Problem: Clinical Measurements: Goal: Cardiovascular complication will be avoided Outcome: Progressing   Problem: Activity: Goal: Risk for activity intolerance will decrease Outcome: Progressing   Problem: Coping: Goal: Level of anxiety will decrease Outcome: Progressing   Problem: Elimination: Goal: Will not experience complications related to bowel motility Outcome: Progressing   Problem: Safety: Goal: Ability to remain free from injury will improve Outcome: Progressing   Problem: Skin Integrity: Goal: Risk for impaired skin integrity will decrease Outcome: Progressing

## 2020-10-02 NOTE — Consult Note (Signed)
Palliative Care Consult Note                                  Date: 10/02/2020   Patient Name: Tiffany [name]  DOB: May 28, 1950  MRN: [id number]  Age / Sex: 70 y.o., female  PCP: Nolene Ebbs, MD Referring Physician: Aline August, MD  Reason for Consultation: Establishing goals of care  HPI/Patient Profile: 70 y.o. 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  Hemoglobin was 7.3 on admission (baseline 9-10) and creatinine 3.67.  FOBT negative, felt likely due to worsening chronic kidney disease with recent iron panel showing anemia of chronic disease.  Had received Neupogen 09/19/2020 and is scheduled for another dose on 10/03/2020.  She is a Sales promotion account executive Witness and is declining blood products.  Previously declined dialysis, receiving diuresis.  At some point she indicated she was getting a second opinion at [facility].  In May of this year she had a creatinine of 9 it was admitted to [place] but she discharged due to improvement.  Appreciate nephrology input, has recommended continued diuresis.  Confirm she is refused dialysis.  Acute anemia possibly due to fluid overload and improving with diuresis.  Palliative care was consulted for goals of care.  Past Medical History:  Diagnosis Date  . ALLERGIC RHINITIS   . Arthritis   . CHF (congestive heart failure) ([place])   . Chronic diastolic heart failure ([place]) 05/05/2013   Echo 03/2018:  EF 96-75, +diastolic dysfunction, GLS -20.5%, normal RVSF, RVSP 42.5 (mild elevated), severe LAE, small pericardial eff, mild MAc, mod AV calc, mild PI  . CKD (chronic kidney disease)   . Diabetes mellitus   . Dyspnea   . Heart murmur    at birth  . Hyperlipidemia   .  Hypertension   . Hypothyroid   . Iron deficiency anemia   . Obesity   . OSA on CPAP   . Previous back surgery    x 2  . Refusal of blood transfusions as patient is Jehovah's Witness   . S/P arthroscopy     Social History   Socioeconomic History  . Marital status: Single    Spouse name: Not on file  . Number of children: Not on file  . Years of education: Not on file  . Highest education level: Not on file  Occupational History  . Occupation: school bus driver  Tobacco Use  . Smoking status: Never  . Smokeless tobacco: Never  Vaping Use  . Vaping Use: Never used  Substance and Sexual Activity  . Alcohol use: Yes    Alcohol/week: 0.0 standard drinks    Comment: rare  . Drug use: No  . Sexual activity: Not Currently    Birth control/protection: Surgical  Other Topics Concern  . Not on file  Social History Narrative  . Not on file   Social Determinants of Health   Financial Resource Strain: Not on file  Food Insecurity: Not on file  Transportation Needs: Not on file  Physical Activity: Not on file  Stress: Not on file  Social Connections: Not on file    Family History  Problem Relation Age of Onset  . Prostate cancer Father   .  Congestive Heart Failure Mother   . Diabetes Sister   . Other Sister        septis  . Breast cancer Neg Hx     Subjective:   This NP Walden Field reviewed medical records, received report from team, assessed the patient and then meet at the patient's bedside  to discuss diagnosis, prognosis, GOC, EOL wishes disposition and options.  I met with the patient at her bedside. She did give permission to call her family members as well, specifically her siblings Jethro Bastos and Valissa Lyvers.   Concept of Palliative Care was introduced as specialized medical care for people and their families living with serious illness.  If focuses on providing relief from the symptoms and stress of a serious illness.  The goal is to improve quality of life  for both the patient and the family. Values and goals of care important to patient and family were attempted to be elicited.  Created space and opportunity for patient  and family to explore thoughts and feelings regarding current medical situation   Natural trajectory and expectations at EOL were discussed. Questions and concerns addressed. Patient  encouraged to call with questions or concerns.    Patient/Family Understanding of Illness: She seems to have a fairly good understanding of her illness. She knows that she was very sick in may and went to hospice, but then got better and was able to go home. She knows she has chronic kidney disease and that this is not reversible. She understands that her kidney disease can cause anemia. She became short of breath and had chest pain as well and was recommended to go to the ER to check "if it was my heart or my blood levels." She notes her hgb was low around 8/16 and had started "the shots" (epogen) to bring them up. She was also recently having increased edema. Currently edema and dyspnea are better, chest pain is resolved. Overall she's feeling better.  We continued with discussions about her current clinical state. She agrees she she does not want dialysis. She affirms she is of the [place] and refuses blood products. She seems to have good insight into her disease state and prognosis.  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 She hasn't worked since she became ill in March. She enjoys her job and we had a good, long conversation about this. 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 She hopes to be able to go back to work soon.  Patient Values: Independence (was independent at baseline), work, family  Today's Discussion: We discussed her current disease  trajectory. We discuss how kidney disease is not generally reversible and if her kidneys got worse she would require dialysis. She affirms she does not want dialysis. She accepts that some things are not fixable, and we all have "our time to go." We reminisced about her work and her life, which seems to bring her joy. She was in good spirits during my visit. We discussed goals of care, and she is very specific about her goals (documented in a MOST form as per below). We agreed that PMT would continue to follow and advocate for her.  Review of Systems  Constitutional:  Positive for activity change and fatigue (improved).  Respiratory:  Positive for shortness of breath (improved).   Cardiovascular:  Positive for leg swelling (improved). Negative for chest pain.  Gastrointestinal:  Negative for abdominal pain, nausea and vomiting.   Objective:   Primary Diagnoses: Present on Admission: . Symptomatic anemia . Hypothyroidism . Obstructive sleep apnea . Essential hypertension . Chronic diastolic heart failure ([place]) . Chronic kidney disease, stage V ([place]) . Volume overload   Scheduled Meds: . amLODipine  10 mg Oral BID  . aspirin EC  81 mg Oral Daily  . calcitRIOL  0.25 mcg Oral Daily  . carvedilol  6.25 mg Oral QHS  . cloNIDine  0.3 mg Transdermal Weekly  . ferrous sulfate  324 mg Oral Daily  . hydrALAZINE  50 mg Oral TID  . insulin aspart  0-6 Units Subcutaneous TID WC  . levothyroxine  224 mcg Oral Q0600  . loratadine  10 mg Oral Daily  . metolazone  5 mg Oral Q24H  . mometasone-formoterol  2 puff Inhalation BID  . omega-3 acid ethyl esters  1 g Oral Daily    Continuous Infusions: . furosemide 100 mg (10/02/20 0931)    PRN Meds: albuterol, LORazepam, zolpidem  No Known Allergies  Physical Exam Vitals and nursing note reviewed.  Constitutional:      General: She is not in acute distress.    Appearance: She is obese. She is not ill-appearing or toxic-appearing.  HENT:      Head: Normocephalic and atraumatic.  Cardiovascular:     Rate and Rhythm: Normal rate and regular rhythm.     Heart sounds: Murmur heard.  Systolic murmur is present with a grade of 3/6.  Abdominal:     General: Abdomen is protuberant.     Palpations: Abdomen is soft.  Musculoskeletal:     Right lower leg: 1+ Edema present.     Left lower leg: 1+ Edema present.  Skin:    General: Skin is warm and dry.  Neurological:     General: No focal deficit present.     Mental Status: She is alert.  Psychiatric:        Mood and Affect: Mood normal.        Behavior: Behavior normal.    Vital Signs:  BP (!) 132/54 (BP Location: Right Arm)   Pulse 64   Temp 98.3 F (36.8 C) (Oral)   Resp 16   Ht _0  (1.778 m)   Wt 111.5 kg   SpO2 100%   BMI 35.27 kg/m  Pain Scale: 0-10   Pain Score: 0-No pain  SpO2: SpO2: 100 % O2 Device:SpO2: 100 % O2 Flow Rate: .   IO: Intake/output summary:  Intake/Output Summary (Last 24 hours) at 10/02/2020 1036 Last data filed at 10/02/2020 0932 Gross per 24 hour  Intake 530 ml  Output 3550 ml  Net -3020 ml    LBM: Last BM Date: 10/01/20 Baseline Weight: Weight: 112.5 kg Most recent weight: Weight: 111.5 kg      Palliative Assessment/Data: 80%   Advanced Care Planning:   Primary Decision Maker: PATIENT  Code Status/Advance Care Planning: DNR  A discussion was had today regarding advanced directives. Concepts specific to code status, artifical feeding and hydration, continued IV antibiotics and rehospitalization was had.  The difference between a aggressive medical intervention path and a palliative comfort care path for this patient at this time was had. The MOST form was introduced and discussed. She has decided on limited scope, DNR, no feeding tubes. She is accepting of IVF and antibiotics. MOST form was completed, signed, and scanned into  the chart.  Decisions/Changes to ACP: MOST form completed No feeding tubes DNR Limited scope No  blood products Ok with IVF and antibiotics  Assessment & Plan:   I have reviewed the medical record, interviewed the patient and family, and examined the patient. The following aspects are pertinent.  Impression: . Symptomatic anemia . Hypothyroidism . Obstructive sleep apnea . Essential hypertension . Chronic diastolic heart failure ([place]) . Chronic kidney disease, stage V ([place]) . Volume overload  Pleasant 70 year old female with CKD V, fluid overload and anemia of chronic disease compounded by her hemodilution from fluid retention. She is diuresing and hgb improved, along with dyspnea, edema, chest pain. Overall feels like she's getting better. Independent at baseline, hoping to return to her "normal life."  SUMMARY OF RECOMMENDATIONS   Continue current treatments GOC as per MOST form and above Continue to treat the treatable No blood products Recommendations per nephrology PMT will continue to follow  Symptom Management:  Per primary team Palliative Medicine is available to assist as needed  Additional Recommendations (Limitations, Scope, Preferences): No Artificial Feeding and No Blood Transfusions  Psycho-social/Spiritual:  Additional Recommendations: Caregiving  Support/Resources  Prognosis:  Unable to determine  Discharge Planning:  To Be Determined   Discussed with: Nursing staff, medical staff, patient    Thank you for allowing [place] to participate in the care of Oreatha Fabry PMT will continue to support holistically.  Time In: 10:15 Time Out: 11:30 Time Total: 75 min  Greater than 50%  of this time was spent counseling and coordinating care related to the above assessment and plan.  Signed by: Walden Field, NP Palliative Medicine Team  Team Phone # [phone] (Nights/Weekends)  10/02/2020, 10:36 AM

## 2020-10-02 NOTE — Progress Notes (Signed)
Patient ID: Tiffany [name], female   DOB: 10-04-1950, 70 y.o.   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  On presentation, hemoglobin was 7.3 (baseline around 9-10) with platelets of 142 and creatinine of 3.67.  Troponin was 66 and 69.  EKG without any significant ST or T wave changes.  BNP of 470.  Chest x-ray showed improvement in pulmonary edema from prior.  Nephrology was consulted.  Assessment & Plan:   Symptomatic anemia/anemia of chronic disease -Possibly from chronic kidney disease -Recently had iron panel which showed anemia of chronic disease.  FOBT negative.  She had recently received Epogen on 09/19/2020 and apparently is scheduled for another 1 on 10/03/2020 -Patient is a Jehovah's Witness and does not want blood products. -Hemoglobin pending for today.  Chest pain, likely secondary to symptomatic anemia -chronic diastolic heart failure, hypertension, OSA on CPAP, asthma, hypothyroidism and type 2 diabetes.  Outpatient follow-up with cardiology  Chronic diastolic heart failure Volume overload  -Chest x-ray on presentation showed improvement in pulmonary edema compared to chest x-ray done on 09/25/2020. - Strict input and output.  Daily weights.  Fluid restriction.   -Nephrology following.  Currently on IV Lasix.  Negative balance of 2870 cc since admission.  Chronic kidney disease stage V -Creatinine 3.67 on presentation.  Creatinine 4.1 today. - Diuretic plan as above.  Follow nephrology recommendations.  Hypertension -Blood pressure still elevated.  Continue amlodipine, Coreg, clonidine, hydralazine and  torsemide  OSA -Continue CPAP  Hypothyroidism -Continue Synthroid  Diabetes mellitus type 2 -A1c 5.8.  Continues CBGs with SSI  Generalized conditioning -Patient was discharged to residential hospice in May 2022 but subsequently discharged from residential hospice because of improvement in general condition. -Patient is still chronically ill and deconditioned.  Palliative care consultation is pending. -PT recommends no PT follow-up  DVT prophylaxis: SCDs Code Status: DNR Family Communication: None at bedside Disposition Plan: Status is: Inpatient because: Inpatient level of care appropriate due to severity of illness  Dispo: The patient is from: Home              Anticipated d/c is to: Home              Patient currently is not medically stable to d/c.   Difficult to place patient No  Consultants: Nephrology/palliative care  Procedures: None  Antimicrobials: None   Subjective: Patient seen and examined at bedside.  Denies fever, worsening shortness of breath, vomiting.  Still short of breath with exertion but feels slightly better today.  Objective: Vitals:   10/01/20 2029 10/01/20 2233 10/02/20 0442 10/02/20 0447  BP: (!) 142/68  (!) 132/54   Pulse: (!) 57 60 64   Resp: 16 16 16    Temp: 98.7 F (37.1 C)  98.3 F (36.8 C)   TempSrc: Oral  Oral   SpO2: 100% 100% 100%   Weight:    111.5 kg  Height:        Intake/Output Summary (Last 24 hours) at 10/02/2020 0805 Last data filed at 10/02/2020 0630 Gross per 24 hour  Intake 480 ml  Output 3200 ml  Net -2720 ml    Filed Weights   10/01/20  8325 10/01/20 0433 10/02/20 0447  Weight: 112.5 kg 112.5 kg 111.5 kg    Examination:  General exam: No acute distress.  Currently on room air.  Looks chronically ill and deconditioned. Respiratory system: Bilateral decreased breath sounds at bases with scattered crackles  cardiovascular system: Intermittently bradycardic; S1-S2 heard gastrointestinal system: Abdomen is  mildly distended, soft and nontender.  Normal bowel sounds heard  extremities: No cyanosis; lower extremity edema present bilaterally Central nervous system: Alert and oriented.  No focal neurological deficits.  Moving extremities skin: No obvious petechiae/lesions  psychiatry: Patient more pleasant and communicative today.  Data Reviewed: I have personally reviewed following labs and imaging studies  CBC: Recent Labs  Lab 09/29/20 1525 09/30/20 0200 10/01/20 0743  WBC 4.7 4.4 5.2  NEUTROABS 3.1  --  3.5  HGB 7.3* 7.6* 8.3*  HCT 23.2* 24.3* 26.9*  MCV 89.9 90.7 90.6  PLT 142* 151 498    Basic Metabolic Panel: Recent Labs  Lab 09/29/20 1525 09/30/20 1106 10/01/20 0743 10/02/20 0457  NA 144 140 144 141  K 4.7 4.7 4.3 4.1  CL 111 110 110 108  CO2 21* 20* 22 22  GLUCOSE 159* 184* 142* 128*  BUN 114* 105* 98* 111*  CREATININE 3.67* 3.86* 3.66* 4.10*  CALCIUM 9.6 9.3 10.0 8.9    GFR: Estimated Creatinine Clearance: 17.5 mL/min (A) (by C-G formula based on SCr of 4.1 mg/dL (H)). Liver Function Tests: No results for input(s): AST, ALT, ALKPHOS, BILITOT, PROT, ALBUMIN in the last 168 hours. No results for input(s): LIPASE, AMYLASE in the last 168 hours. No results for input(s): AMMONIA in the last 168 hours. Coagulation Profile: No results for input(s): INR, PROTIME in the last 168 hours. Cardiac Enzymes: No results for input(s): CKTOTAL, CKMB, CKMBINDEX, TROPONINI in the last 168 hours. BNP (last 3 results) Recent Labs    01/25/20 1506  PROBNP 2,507*    HbA1C: Recent Labs    09/30/20 0117  HGBA1C 5.8*    CBG: Recent Labs  Lab 10/01/20 0013 10/01/20 0806 10/01/20 1236 10/01/20 1701 10/01/20 2034  GLUCAP 139* 157* 167* 130* 138*    Lipid Profile: No results for input(s): CHOL, HDL, LDLCALC, TRIG, CHOLHDL, LDLDIRECT in the last 72 hours. Thyroid Function Tests: No results for input(s): TSH, T4TOTAL, FREET4, T3FREE, THYROIDAB in the last 72  hours. Anemia Panel: Recent Labs    09/30/20 0103  VITAMINB12 >7,500*  FOLATE 12.7    Sepsis Labs: No results for input(s): PROCALCITON, LATICACIDVEN in the last 168 hours.  Recent Results (from the past 240 hour(s))  SARS CORONAVIRUS 2 (TAT 6-24 HRS) Nasopharyngeal Nasopharyngeal Swab     Status: None   Collection Time: 09/30/20 12:03 AM   Specimen: Nasopharyngeal Swab  Result Value Ref Range Status   SARS Coronavirus 2 NEGATIVE NEGATIVE Final    Comment: (NOTE) SARS-CoV-2 target nucleic acids are NOT DETECTED.  The SARS-CoV-2 RNA is generally detectable in upper and lower respiratory specimens during the acute phase of infection. Negative results do not preclude SARS-CoV-2 infection, do not rule out co-infections with other pathogens, and should not be used as the sole basis for treatment or other patient management decisions. Negative results must be combined with clinical observations, patient history, and epidemiological information. The expected result is Negative.  Fact Sheet for Patients: SugarRoll.be  Fact Sheet for Healthcare Providers: https://www.woods-mathews.com/  This test is not yet approved or cleared by the [place] FDA and  has been authorized for detection and/or diagnosis of  SARS-CoV-2 by FDA under an Emergency Use Authorization (EUA). This EUA will remain  in effect (meaning this test can be used) for the duration of the COVID-19 declaration under Se ction 564(b)(1) of the Act, 21 U.S.C. section 360bbb-3(b)(1), unless the authorization is terminated or revoked sooner.  Performed at [facility], [place] [address] [place], [place] [address]           Radiology Studies: No results found.      Scheduled Meds:  amLODipine  10 mg Oral BID   aspirin EC  81 mg Oral Daily   calcitRIOL  0.25 mcg Oral Daily   carvedilol  6.25 mg Oral QHS   cloNIDine  0.3 mg Transdermal Weekly   ferrous  sulfate  324 mg Oral Daily   hydrALAZINE  50 mg Oral TID   insulin aspart  0-6 Units Subcutaneous TID WC   levothyroxine  224 mcg Oral Q0600   loratadine  10 mg Oral Daily   metolazone  5 mg Oral Q24H   mometasone-formoterol  2 puff Inhalation BID   omega-3 acid ethyl esters  1 g Oral Daily   Continuous Infusions:  furosemide 100 mg (10/02/20 0022)          Aline August, MD [facility] 10/02/2020, 8:05 AM

## 2020-10-03 ENCOUNTER — Encounter (HOSPITAL_COMMUNITY): Payer: Medicare Other

## 2020-10-03 LAB — BASIC METABOLIC PANEL
Anion gap: 11 (ref 5–15)
BUN: 111 mg/dL — ABNORMAL HIGH (ref 8–23)
CO2: 21 mmol/L — ABNORMAL LOW (ref 22–32)
Calcium: 9.2 mg/dL (ref 8.9–10.3)
Chloride: 109 mmol/L (ref 98–111)
Creatinine, Ser: 3.87 mg/dL — ABNORMAL HIGH (ref 0.44–1.00)
GFR, Estimated: 12 mL/min — ABNORMAL LOW (ref >60)
Glucose, Bld: 124 mg/dL — ABNORMAL HIGH (ref 70–99)
Potassium: 3.8 mmol/L (ref 3.5–5.1)
Sodium: 141 mmol/L (ref 135–145)

## 2020-10-03 LAB — GLUCOSE, CAPILLARY
Glucose-Capillary: 135 mg/dL — ABNORMAL HIGH (ref 70–99)
Glucose-Capillary: 192 mg/dL — ABNORMAL HIGH (ref 70–99)
Glucose-Capillary: 160 mg/dL — ABNORMAL HIGH (ref 70–99)
Glucose-Capillary: 161 mg/dL — ABNORMAL HIGH (ref 70–99)

## 2020-10-03 MED ORDER — DARBEPOETIN ALFA 100 MCG/0.5ML IJ SOSY
100.0000 ug | PREFILLED_SYRINGE | Freq: Once | INTRAMUSCULAR | Status: AC
  Administered 2020-10-03: 100 ug via SUBCUTANEOUS
  Filled 2020-10-03: qty 0.5

## 2020-10-03 NOTE — Progress Notes (Signed)
Patient ID: Tiffany [name], female   DOB: 17-Jan-1951, 70 y.o.   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  On presentation, hemoglobin was 7.3 (baseline around 9-10) with platelets of 142 and creatinine of 3.67.  Troponin was 66 and 69.  EKG without any significant ST or T wave changes.  BNP of 470.  Chest x-ray showed improvement in pulmonary edema from prior.  Nephrology was consulted.  Assessment & Plan:   Symptomatic anemia/anemia of chronic disease -Possibly from chronic kidney disease -Recently had iron panel which showed anemia of chronic disease.  FOBT negative.  She had recently received Epogen on 09/19/2020 and apparently is scheduled for another 1 on 10/03/2020 -Patient is a Jehovah's Witness and does not want blood products. -Hemoglobin pending for today.  Chest pain, likely secondary to symptomatic anemia -Troponins did not trend up significantly.  EKG without any significant ST or T wave changes.  -Chest pain has currently improved.  Outpatient follow-up with cardiology  Chronic diastolic heart failure Volume overload  -Chest x-ray on presentation showed improvement in pulmonary edema compared to chest x-ray done on 09/25/2020. - Strict input and output.  Daily weights.  Fluid restriction.   -Nephrology following.  Currently on IV Lasix.  Negative balance of 4170 cc since admission.  Chronic kidney disease stage V -Creatinine 3.67 on presentation.  Creatinine 3.87 today. - Diuretic plan as above.  Follow nephrology recommendations.  Hypertension -Continue amlodipine, Coreg, clonidine, hydralazine and Lasix OSA -Continue  CPAP  Hypothyroidism -Continue Synthroid  Diabetes mellitus type 2 -A1c 5.8.  Continues CBGs with SSI  Generalized conditioning -Patient was discharged to residential hospice in May 2022 but subsequently discharged from residential hospice because of improvement in general condition. -Patient is still chronically ill and deconditioned.  Palliative care evaluation appreciated. -PT recommends no PT follow-up  DVT prophylaxis: SCDs Code Status: DNR Family Communication: None at bedside Disposition Plan: Status is: Inpatient because: Inpatient level of care appropriate due to severity of illness  Dispo: The patient is from: Home              Anticipated d/c is to: Home              Patient currently is not medically stable to d/c.   Difficult to place patient No  Consultants: Nephrology/palliative care  Procedures: None  Antimicrobials: None   Subjective: Patient seen and examined at bedside.  No chest pain, fever, vomiting reported.  Still slightly short of breath with exertion but improved. Objective: Vitals:   10/02/20 1406 10/02/20 2049 10/02/20 2109 10/03/20 0548  BP: (!) 153/72 (!) 147/68  134/63  Pulse: 63 62  68  Resp: 16 17  15   Temp: 98.3 F (36.8 C) 98.2 F (36.8 C)  98.2 F (36.8 C)  TempSrc: Oral Oral  Oral  SpO2: 99% 100% 100% 100%  Weight:    110 kg  Height:        Intake/Output Summary (Last 24 hours) at 10/03/2020 0806 Last data filed at 10/03/2020 0008 Gross per 24 hour  Intake 450 ml  Output 1750 ml  Net -1300 ml    Filed Weights   10/01/20 0433  10/02/20 0447 10/03/20 0548  Weight: 112.5 kg 111.5 kg 110 kg    Examination:  General exam: On room air currently.  No distress.  Looks chronically ill and deconditioned. Respiratory system: Decreased breath sounds at bases bilaterally with basilar crackles  cardiovascular system: S1-S2 heard; currently rate controlled gastrointestinal system: Abdomen is distended slightly, soft and nontender.   Bowel sounds are heard extremities: Bilateral lower extremity edema present; no clubbing Central nervous system: Awake and alert.  No focal neurological deficits.  Moves extremities skin: No obvious ecchymosis/rashes psychiatry: Participates in conversation well; pleasant  Data Reviewed: I have personally reviewed following labs and imaging studies  CBC: Recent Labs  Lab 09/29/20 1525 09/30/20 0200 10/01/20 0743  WBC 4.7 4.4 5.2  NEUTROABS 3.1  --  3.5  HGB 7.3* 7.6* 8.3*  HCT 23.2* 24.3* 26.9*  MCV 89.9 90.7 90.6  PLT 142* 151 893    Basic Metabolic Panel: Recent Labs  Lab 09/29/20 1525 09/30/20 1106 10/01/20 0743 10/02/20 0457 10/03/20 0523  NA 144 140 144 141 141  K 4.7 4.7 4.3 4.1 3.8  CL 111 110 110 108 109  CO2 21* 20* 22 22 21*  GLUCOSE 159* 184* 142* 128* 124*  BUN 114* 105* 98* 111* PENDING  CREATININE 3.67* 3.86* 3.66* 4.10* 3.87*  CALCIUM 9.6 9.3 10.0 8.9 9.2    GFR: Estimated Creatinine Clearance: 18.4 mL/min (A) (by C-G formula based on SCr of 3.87 mg/dL (H)). Liver Function Tests: No results for input(s): AST, ALT, ALKPHOS, BILITOT, PROT, ALBUMIN in the last 168 hours. No results for input(s): LIPASE, AMYLASE in the last 168 hours. No results for input(s): AMMONIA in the last 168 hours. Coagulation Profile: No results for input(s): INR, PROTIME in the last 168 hours. Cardiac Enzymes: No results for input(s): CKTOTAL, CKMB, CKMBINDEX, TROPONINI in the last 168 hours. BNP (last 3 results) Recent Labs    01/25/20 1506  PROBNP 2,507*    HbA1C: No results for input(s): HGBA1C in the last 72 hours.  CBG: Recent Labs  Lab 10/02/20 0834 10/02/20 1254 10/02/20 1714 10/02/20 2051 10/03/20 0745  GLUCAP 161* 147* 146* 191* 135*    Lipid Profile: No results for input(s): CHOL, HDL, LDLCALC, TRIG, CHOLHDL, LDLDIRECT in the last 72 hours. Thyroid Function Tests: No results for input(s): TSH, T4TOTAL, FREET4, T3FREE, THYROIDAB in the last 72  hours. Anemia Panel: No results for input(s): VITAMINB12, FOLATE, FERRITIN, TIBC, IRON, RETICCTPCT in the last 72 hours.  Sepsis Labs: No results for input(s): PROCALCITON, LATICACIDVEN in the last 168 hours.  Recent Results (from the past 240 hour(s))  SARS CORONAVIRUS 2 (TAT 6-24 HRS) Nasopharyngeal Nasopharyngeal Swab     Status: None   Collection Time: 09/30/20 12:03 AM   Specimen: Nasopharyngeal Swab  Result Value Ref Range Status   SARS Coronavirus 2 NEGATIVE NEGATIVE Final    Comment: (NOTE) SARS-CoV-2 target nucleic acids are NOT DETECTED.  The SARS-CoV-2 RNA is generally detectable in upper and lower respiratory specimens during the acute phase of infection. Negative results do not preclude SARS-CoV-2 infection, do not rule out co-infections with other pathogens, and should not be used as the sole basis for treatment or other patient management decisions. Negative results must be combined with clinical observations, patient history, and epidemiological information. The expected result is Negative.  Fact Sheet for Patients: SugarRoll.be  Fact Sheet for Healthcare Providers: https://www.woods-mathews.com/  This test is not yet approved or cleared by the [place] FDA and  has been authorized for  detection and/or diagnosis of SARS-CoV-2 by FDA under an Emergency Use Authorization (EUA). This EUA will remain  in effect (meaning this test can be used) for the duration of the COVID-19 declaration under Se ction 564(b)(1) of the Act, 21 U.S.C. section 360bbb-3(b)(1), unless the authorization is terminated or revoked sooner.  Performed at [facility], [place] [address], [place], [place] [address]           Radiology Studies: No results found.      Scheduled Meds:  amLODipine  10 mg Oral BID   aspirin EC  81 mg Oral Daily   calcitRIOL  0.25 mcg Oral Daily   carvedilol  6.25 mg Oral QHS   cloNIDine  0.3 mg  Transdermal Weekly   ferrous sulfate  324 mg Oral Daily   hydrALAZINE  50 mg Oral TID   insulin aspart  0-6 Units Subcutaneous TID WC   levothyroxine  224 mcg Oral Q0600   loratadine  10 mg Oral Daily   metolazone  5 mg Oral Q24H   mometasone-formoterol  2 puff Inhalation BID   omega-3 acid ethyl esters  1 g Oral Daily   Continuous Infusions:  furosemide 100 mg (10/03/20 0008)          Aline August, MD [facility] 10/03/2020, 8:06 AM

## 2020-10-03 NOTE — Progress Notes (Signed)
CPAP at bedside. Patient prefer self placement when ready. Advised to let RN know if help needed.

## 2020-10-03 NOTE — Plan of Care (Signed)
  Problem: Nutrition: Goal: Adequate nutrition will be maintained Outcome: Progressing   Problem: Pain Managment: Goal: General experience of comfort will improve Outcome: Progressing   Problem: Activity: Goal: Risk for activity intolerance will decrease Outcome: Progressing   

## 2020-10-03 NOTE — Progress Notes (Signed)
Daily Progress Note   Patient Name: Tiffany [name]       Date: 10/03/2020 DOB: 27-Aug-1950  Age: 70 y.o. MRN#: [id number] Attending Physician: Aline August, MD Primary Care Physician: Nolene Ebbs, MD Admit Date: 09/29/2020 Length of Stay: 3 days  Reason for Consultation/Follow-up: Establishing goals of care  HPI/Patient Profile:  70 y.o. 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  Hemoglobin was 7.3 on admission (baseline 9-10) and creatinine 3.67.  FOBT negative, felt likely due to worsening chronic kidney disease with recent iron panel showing anemia of chronic disease.  Had received Neupogen 09/19/2020 and is scheduled for another dose on 10/03/2020.  She is a Sales promotion account executive Witness and is declining blood products.  Previously declined dialysis, receiving diuresis.  At some point she indicated she was getting a second opinion at [facility].  In May of this year she had a creatinine of 9 it was admitted to [facility] but she discharged due to improvement.  Appreciate nephrology input, has recommended continued diuresis.  Confirm she is refused dialysis.  Acute anemia possibly due to fluid overload and improving with diuresis.   Palliative care was consulted for goals of care.  Current Medications: Scheduled Meds:  . amLODipine  10 mg Oral BID  . aspirin EC  81 mg Oral Daily  . calcitRIOL  0.25 mcg Oral Daily  . carvedilol  6.25 mg Oral QHS  . cloNIDine  0.3 mg Transdermal Weekly  . ferrous sulfate  324 mg Oral Daily  . hydrALAZINE  50 mg Oral TID  . insulin aspart  0-6 Units Subcutaneous TID WC  . levothyroxine  224 mcg Oral Q0600  . loratadine  10 mg Oral Daily  . metolazone  5 mg Oral Q24H  . mometasone-formoterol  2 puff Inhalation BID  .  omega-3 acid ethyl esters  1 g Oral Daily    Continuous Infusions: . furosemide 100 mg (10/03/20 0008)    PRN Meds: albuterol, LORazepam, zolpidem  Subjective:   Subjective: Chart Reviewed. Updates received. Patient Assessed. Created space and opportunity for patient  and family to explore thoughts and feelings regarding current medical situation.  Today's Discussion: Today she states she doing well overall.  Her lower extremity edema seems to be improved.  No chest pain, shortness of breath.  Her hemoglobin has increased to 8.6 which we have discussed.  Likely multifactorial anemia with chronic kidney disease stage V as well as fluid overload and dilution effect.  We discussed continuing Epogen.  She states she was post to get this today and she is not sure she will get it in the hospital here instead.  Overall she feels she is getting better and should hopefully be able to go home soon.  She is wanting to get well and get back to her normal life.  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  She agrees that her goals are set and she is comfortable with this.  Review of Systems  Constitutional:        Feels ok overall  Respiratory:  Negative for cough, chest tightness and shortness of breath.   Cardiovascular:  Positive for leg swelling (improved). Negative for chest pain.  Gastrointestinal:  Negative for abdominal pain, nausea and  vomiting.   Objective:   Vital Signs: BP 134/63 (BP Location: Left Arm)   Pulse 68   Temp 98.2 F (36.8 C) (Oral)   Resp 15   Ht 5\' 10"  (1.778 m)   Wt 110 kg   SpO2 100%   BMI 34.80 kg/m  SpO2: SpO2: 100 % O2 Device: O2 Device: Room Air O2 Flow Rate:    Physical Exam: Physical Exam Vitals and nursing note reviewed.  Constitutional:      General: She is not in acute distress.    Appearance: She is obese. She is not ill-appearing or toxic-appearing.     Comments: Sitting edge of bed with RT  administering inhaler  HENT:     Head: Normocephalic and atraumatic.  Cardiovascular:     Rate and Rhythm: Normal rate and regular rhythm.     Heart sounds: Murmur heard.  Systolic murmur is present with a grade of 3/6.  Pulmonary:     Effort: Pulmonary effort is normal. No respiratory distress.     Breath sounds: Normal breath sounds. No wheezing or rhonchi.  Abdominal:     General: Abdomen is protuberant. There is no distension.     Palpations: Abdomen is soft.     Tenderness: There is no abdominal tenderness.  Skin:    General: Skin is warm and dry.  Neurological:     General: No focal deficit present.     Mental Status: She is alert.  Psychiatric:        Mood and Affect: Mood normal.        Behavior: Behavior normal.    SpO2: SpO2: 100 % O2 Device:SpO2: 100 % O2 Flow Rate: .   IO: Intake/output summary:  Intake/Output Summary (Last 24 hours) at 10/03/2020 0817 Last data filed at 10/03/2020 0008 Gross per 24 hour  Intake 450 ml  Output 1750 ml  Net -1300 ml    LBM: Last BM Date: 10/02/20 Baseline Weight: Weight: 112.5 kg Most recent weight: Weight: 110 kg   Palliative Assessment/Data: 80%   Assessment & Plan:   Impression:  . Symptomatic anemia . Hypothyroidism . Obstructive sleep apnea . Essential hypertension . Chronic diastolic heart failure ([place]) . Chronic kidney disease, stage V ([place]) . Volume overload   Pleasant 70 year old female with CKD V, fluid overload and anemia of chronic disease compounded by her hemodilution from fluid retention. She is diuresing and hgb improved, along with dyspnea, edema, chest pain. Overall feels like she's getting better. Independent at baseline, hoping to return to her "normal life."  SUMMARY OF RECOMMENDATIONS   Continue current treatments GOC as per MOST form scanned into the chart Continue to treat the treatable No blood products Recommendations per nephrology  Code Status: DNR  Prognosis: Unable to  determine  Discharge Planning: To Be Determined  Discussed with: Patient, nursing staff, medical staff  Thank you for allowing [place] to participate in the care of Tiffany [name] PMT will continue to support holistically.  Time Total: 35 min  Visit consisted of counseling and education dealing with the complex and emotionally intense issues of symptom management and palliative care in the setting of serious and potentially life-threatening illness. Greater than 50%  of this time was spent counseling and coordinating care related to the above assessment and plan.  Walden Field, NP Palliative Medicine Team  Team Phone # [phone] (Nights/Weekends)  10/03/2020, 8:17 AM

## 2020-10-03 NOTE — Progress Notes (Signed)
Tiffany [name]   Subjective:   Interval History: This is a 70 year old lady CKD 5 not currently on dialysis diabetes type mellitus type II Jehovah's Witness.  She was admitted for symptomatic anemia and volume overload with congestive heart failure and diastolic dysfunction 8/92/1194.  She had an episode of acute kidney injury in May 2022 with a creatinine increased to 9 and was admitted to [facility].  Her creatinine appears to be about 3.8 mg/dL.  She appears to be diuresing with IV Lasix.  She has refused dialysis.  Blood pressure 1 3460 pulse 68 temperature 98.2 O2 sats 1% room air  Sodium 141 potassium 3.8 chloride 109 CO2 21 creatinine 3.87  Urine output 2.5 L 10/02/2020  Objective:  Vital signs in last 24 hours:  Temp:  [98.2 F (36.8 C)-98.3 F (36.8 C)] 98.2 F (36.8 C) (08/30 0548) Pulse Rate:  [62-68] 68 (08/30 0548) Resp:  [15-17] 15 (08/30 0548) BP: (134-153)/(63-72) 134/63 (08/30 0548) SpO2:  [99 %-100 %] 100 % (08/30 0548) Weight:  [110 kg] 110 kg (08/30 0548)  Weight change: -1.5 kg Filed Weights   10/01/20 0433 10/02/20 0447 10/03/20 0548  Weight: 112.5 kg 111.5 kg 110 kg    Intake/Output: I/O last 3 completed shifts: In: 690 [P.O.:540; IV Piggyback:150] Out: 2750 [Urine:2750]   Intake/Output this shift:  No intake/output data recorded.  CVS- RRR RS- CTA ABD- BS present soft non-distended EXT-2+ edema   Basic Metabolic Panel: Recent Labs  Lab 09/29/20 1525 09/30/20 1106 10/01/20 0743 10/02/20 0457 10/03/20 0523  NA 144 140 144 141 141  K 4.7 4.7 4.3 4.1 3.8  CL 111 110 110 108 109  CO2 21* 20* 22 22 21*  GLUCOSE 159* 184* 142* 128* 124*  BUN 114* 105* 98* 111* PENDING  CREATININE 3.67* 3.86* 3.66* 4.10* 3.87*  CALCIUM 9.6 9.3 10.0 8.9 9.2     Liver Function Tests: No results for input(s): AST, ALT, ALKPHOS, BILITOT, PROT, ALBUMIN in the last 168 hours. No results for input(s): LIPASE, AMYLASE in the last 168  hours. No results for input(s): AMMONIA in the last 168 hours.  CBC: Recent Labs  Lab 09/29/20 1525 09/30/20 0200 10/01/20 0743  WBC 4.7 4.4 5.2  NEUTROABS 3.1  --  3.5  HGB 7.3* 7.6* 8.3*  HCT 23.2* 24.3* 26.9*  MCV 89.9 90.7 90.6  PLT 142* 151 156     Cardiac Enzymes: No results for input(s): CKTOTAL, CKMB, CKMBINDEX, TROPONINI in the last 168 hours.  BNP: Invalid input(s): POCBNP  CBG: Recent Labs  Lab 10/02/20 0834 10/02/20 1254 10/02/20 1714 10/02/20 2051 10/03/20 0745  GLUCAP 161* 147* 146* 191* 135*     Microbiology: Results for orders placed or performed during the hospital encounter of 09/29/20  SARS CORONAVIRUS 2 (TAT 6-24 HRS) Nasopharyngeal Nasopharyngeal Swab     Status: None   Collection Time: 09/30/20 12:03 AM   Specimen: Nasopharyngeal Swab  Result Value Ref Range Status   SARS Coronavirus 2 NEGATIVE NEGATIVE Final    Comment: ([name]) SARS-CoV-2 target nucleic acids are NOT DETECTED.  The SARS-CoV-2 RNA is generally detectable in upper and lower respiratory specimens during the acute phase of infection. Negative results do not preclude SARS-CoV-2 infection, do not rule out co-infections with other pathogens, and should not be used as the sole basis for treatment or other patient management decisions. Negative results must be combined with clinical observations, patient history, and epidemiological information. The expected result is Negative.  Fact Sheet  for Patients: SugarRoll.be  Fact Sheet for Healthcare Providers: https://www.woods-mathews.com/  This test is not yet approved or cleared by the [place] FDA and  has been authorized for detection and/or diagnosis of SARS-CoV-2 by FDA under an Emergency Use Authorization (EUA). This EUA will remain  in effect (meaning this test can be used) for the duration of the COVID-19 declaration under Se ction 564(b)(1) of the Act, 21 U.S.C. section  360bbb-3(b)(1), unless the authorization is terminated or revoked sooner.  Performed at [facility], [place] [address] [place], [place] [address]     Coagulation Studies: No results for input(s): LABPROT, INR in the last 72 hours.  Urinalysis: No results for input(s): COLORURINE, LABSPEC, PHURINE, GLUCOSEU, HGBUR, BILIRUBINUR, KETONESUR, PROTEINUR, UROBILINOGEN, NITRITE, LEUKOCYTESUR in the last 72 hours.  Invalid input(s): APPERANCEUR    Imaging: No results found.   Medications:    furosemide 100 mg (10/03/20 0008)    amLODipine  10 mg Oral BID   aspirin EC  81 mg Oral Daily   calcitRIOL  0.25 mcg Oral Daily   carvedilol  6.25 mg Oral QHS   cloNIDine  0.3 mg Transdermal Weekly   ferrous sulfate  324 mg Oral Daily   hydrALAZINE  50 mg Oral TID   insulin aspart  0-6 Units Subcutaneous TID WC   levothyroxine  224 mcg Oral Q0600   loratadine  10 mg Oral Daily   metolazone  5 mg Oral Q24H   mometasone-formoterol  2 puff Inhalation BID   omega-3 acid ethyl esters  1 g Oral Daily   albuterol, LORazepam, zolpidem  Assessment/ Plan:  CKD V - b/l creat from late 2021- 2022 is 3.70- 4.25, eGFR 10-14. Had AKI in May w/ creat 9 and was admitted to [facility], but pt  "got better" and is now admitted w/ usual CKD V, creat 3.8 and volume overload w/ LE edema and DOE.  Diuresing w/ high dose IV lasix.  She has refused dialysis in general, f/b Dr Johnney Ou.  Not all UOP recorded, good UOP per patient. Continue IV lasix 100 tid w/ po metolazone 5. Creat stable. Needs further diuresis. Will follow.  DM2 HTN - on 4 bp lowering meds at home Anemia ckd - she is getting esa shots every 2 wks, next due this Tuesday, she is a Jehovah Witness. Hb here 7.6 > 8.6 today , likely Hb was low due to vol overload , now improving w/ diuresis.     LOS: [place] @TODAY @8 :04 AM

## 2020-10-04 LAB — BASIC METABOLIC PANEL
Anion gap: 12 (ref 5–15)
BUN: 116 mg/dL — ABNORMAL HIGH (ref 8–23)
CO2: 23 mmol/L (ref 22–32)
Calcium: 9.1 mg/dL (ref 8.9–10.3)
Chloride: 109 mmol/L (ref 98–111)
Creatinine, Ser: 4.02 mg/dL — ABNORMAL HIGH (ref 0.44–1.00)
GFR, Estimated: 11 mL/min — ABNORMAL LOW (ref >60)
Glucose, Bld: 125 mg/dL — ABNORMAL HIGH (ref 70–99)
Potassium: 3.6 mmol/L (ref 3.5–5.1)
Sodium: 144 mmol/L (ref 135–145)

## 2020-10-04 LAB — CBC WITH DIFFERENTIAL/PLATELET
Abs Immature Granulocytes: 0.01 10*3/uL (ref 0.00–0.07)
Basophils Absolute: 0 10*3/uL (ref 0.0–0.1)
Basophils Relative: 0 %
Eosinophils Absolute: 0.1 10*3/uL (ref 0.0–0.5)
Eosinophils Relative: 2 %
HCT: 22.4 % — ABNORMAL LOW (ref 36.0–46.0)
Hemoglobin: 7 g/dL — ABNORMAL LOW (ref 12.0–15.0)
Immature Granulocytes: 0 %
Lymphocytes Relative: 24 %
Lymphs Abs: 1 10*3/uL (ref 0.7–4.0)
MCH: 27.9 pg (ref 26.0–34.0)
MCHC: 31.3 g/dL (ref 30.0–36.0)
MCV: 89.2 fL (ref 80.0–100.0)
Monocytes Absolute: 0.5 10*3/uL (ref 0.1–1.0)
Monocytes Relative: 13 %
Neutro Abs: 2.5 10*3/uL (ref 1.7–7.7)
Neutrophils Relative %: 61 %
Platelets: 98 10*3/uL — ABNORMAL LOW (ref 150–400)
RBC: 2.51 MIL/uL — ABNORMAL LOW (ref 3.87–5.11)
RDW: 17.2 % — ABNORMAL HIGH (ref 11.5–15.5)
WBC: 4.2 10*3/uL (ref 4.0–10.5)
nRBC: 0 % (ref 0.0–0.2)

## 2020-10-04 LAB — GLUCOSE, CAPILLARY
Glucose-Capillary: 116 mg/dL — ABNORMAL HIGH (ref 70–99)
Glucose-Capillary: 173 mg/dL — ABNORMAL HIGH (ref 70–99)
Glucose-Capillary: 133 mg/dL — ABNORMAL HIGH (ref 70–99)

## 2020-10-04 NOTE — Progress Notes (Signed)
   10/04/20 1200  Mobility  Activity Ambulated in hall  Level of Assistance Independent  Assistive Device None  Distance Ambulated (ft) 200 ft  Mobility Ambulated independently in hallway  Mobility Response Tolerated well  Mobility performed by Mobility specialist  $Mobility charge 1 Mobility   Upon entering pt was reluctant to ambulate. Ensured pt ambulation in hall would highly benefit her. Pt agreed. Ambulated in hallway about 262ft independently. One resting break about half way to look out front window. Tolerated well. Left pt in bed with call bell at side. Notified nurse of session.    [facility] Acute Rehab Services Office: [phone]

## 2020-10-04 NOTE — Progress Notes (Signed)
MD order to discharge  Discharge instructions given to pt  Verbalizes understanding of discharge instructions  Wheelchair to POV accompanied by staff

## 2020-10-04 NOTE — Progress Notes (Addendum)
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  She was admitted for symptomatic anemia and volume overload with congestive heart failure and diastolic dysfunction 10/19/7827.  She had an episode of acute kidney injury in May 2022 with a creatinine increased to 9 and was admitted to [place].  Her creatinine appears to be about 3.8 mg/dL.  She appears to be diuresing with IV Lasix.  She has refused dialysis.  Blood pressure 127/53 pulse 68 temperature 98 O2 sats 90% room air  Sodium 144 potassium 3.6 chloride 109 CO2 23 BUN 116  creatinine 4 calcium 9.1 hemoglobin 7  Urine output 2.4L 10/03/2020  Her edema is resolved   Please stop IV and metolazone and  convert back to home schedule of torsemide  Objective:  Vital signs in last 24 hours:  Temp:  [98.1 F (36.7 C)-99.1 F (37.3 C)] 98.1 F (36.7 C) (08/31 0433) Pulse Rate:  [63-68] 68 (08/31 0433) Resp:  [16-20] 20 (08/31 0433) BP: (127-144)/(53-68) 127/53 (08/31 0433) SpO2:  [98 %-100 %] 98 % (08/31 0845) Weight:  [105 kg] 105 kg (08/31 0433)  Weight change: -5 kg Filed Weights   10/02/20 0447 10/03/20 0548 10/04/20 0433  Weight: 111.5 kg 110 kg 105 kg    Intake/Output: I/O last 3 completed shifts: In: 200 [P.O.:100; IV Piggyback:100] Out: 3700 [Urine:3700]   Intake/Output this shift:  Total I/O In: -  Out: 1100 [Urine:1100]  CVS- RRR RS- CTA ABD- BS present soft non-distended EXT-2+ edema   Basic Metabolic Panel: Recent Labs  Lab 09/30/20 1106 10/01/20 0743 10/02/20 0457 10/03/20 0523 10/04/20 0524  NA 140 144 141 141 144  K 4.7 4.3 4.1 3.8 3.6  CL 110 110 108 109 109  CO2 20* 22 22 21* 23  GLUCOSE 184* 142* 128* 124* 125*  BUN 105* 98* 111* 111* 116*  CREATININE 3.86* 3.66* 4.10* 3.87* 4.02*  CALCIUM 9.3 10.0 8.9 9.2 9.1     Liver Function Tests: No results  for input(s): AST, ALT, ALKPHOS, BILITOT, PROT, ALBUMIN in the last 168 hours. No results for input(s): LIPASE, AMYLASE in the last 168 hours. No results for input(s): AMMONIA in the last 168 hours.  CBC: Recent Labs  Lab 09/29/20 1525 09/30/20 0200 10/01/20 0743 10/04/20 0524  WBC 4.7 4.4 5.2 4.2  NEUTROABS 3.1  --  3.5 2.5  HGB 7.3* 7.6* 8.3* 7.0*  HCT 23.2* 24.3* 26.9* 22.4*  MCV 89.9 90.7 90.6 89.2  PLT 142* 151 156 98*     Cardiac Enzymes: No results for input(s): CKTOTAL, CKMB, CKMBINDEX, TROPONINI in the last 168 hours.  BNP: Invalid input(s): POCBNP  CBG: Recent Labs  Lab 10/03/20 0745 10/03/20 1143 10/03/20 1718 10/03/20 2128 10/04/20 0754  GLUCAP 135* 192* 160* 161* 116*     Microbiology: Results for orders placed or performed during the hospital encounter of 09/29/20  SARS CORONAVIRUS 2 (TAT 6-24 HRS) Nasopharyngeal Nasopharyngeal Swab     Status: None   Collection Time: 09/30/20 12:03 AM   Specimen: Nasopharyngeal Swab  Result Value Ref Range Status   SARS Coronavirus 2 NEGATIVE NEGATIVE Final    Comment: (NOTE) SARS-CoV-2 target nucleic acids are NOT DETECTED.  The SARS-CoV-2 RNA is generally detectable in upper and lower respiratory specimens during the acute phase of infection. Negative results do not preclude SARS-CoV-2 infection, do not rule out co-infections with other  pathogens, and should not be used as the sole basis for treatment or other patient management decisions. Negative results must be combined with clinical observations, patient history, and epidemiological information. The expected result is Negative.  Fact Sheet for Patients: SugarRoll.be  Fact Sheet for Healthcare Providers: https://www.woods-mathews.com/  This test is not yet approved or cleared by the [place] FDA and  has been authorized for detection and/or diagnosis of SARS-CoV-2 by FDA under an Emergency Use  Authorization (EUA). This EUA will remain  in effect (meaning this test can be used) for the duration of the COVID-19 declaration under Se ction 564(b)(1) of the Act, 21 U.S.C. section 360bbb-3(b)(1), unless the authorization is terminated or revoked sooner.  Performed at [facility], [place] [address] [place], [place] [address]     Coagulation Studies: No results for input(s): LABPROT, INR in the last 72 hours.  Urinalysis: No results for input(s): COLORURINE, LABSPEC, PHURINE, GLUCOSEU, HGBUR, BILIRUBINUR, KETONESUR, PROTEINUR, UROBILINOGEN, NITRITE, LEUKOCYTESUR in the last 72 hours.  Invalid input(s): APPERANCEUR    Imaging: No results found.   Medications:    furosemide 100 mg (10/04/20 0711)    amLODipine  10 mg Oral BID   aspirin EC  81 mg Oral Daily   calcitRIOL  0.25 mcg Oral Daily   carvedilol  6.25 mg Oral QHS   cloNIDine  0.3 mg Transdermal Weekly   ferrous sulfate  324 mg Oral Daily   hydrALAZINE  50 mg Oral TID   insulin aspart  0-6 Units Subcutaneous TID WC   levothyroxine  224 mcg Oral Q0600   loratadine  10 mg Oral Daily   metolazone  5 mg Oral Q24H   mometasone-formoterol  2 puff Inhalation BID   omega-3 acid ethyl esters  1 g Oral Daily   albuterol, LORazepam, zolpidem  Assessment/ Plan:  CKD V - b/l creat from late 2021- 2022 is 3.70- 4.25, eGFR 10-14. Had AKI in May w/ creat 9 and was admitted to [facility], but pt  "got better" and is now admitted w/ usual CKD V, creat 3.8 and volume overload w/ LE edema and DOE.  Diuresing w/ high dose IV lasix.  She has refused dialysis in general, f/b Dr Johnney Ou.  will continue with home meds and follow up with Dr Johnney Ou   DM2 HTN - on 4 bp lowering meds at home Anemia ckd - she is getting esa shots every 2 wks, next due this Tuesday, she is a Jehovah Witness.  Iron stores 27 %   Aranesp 100 mcg given   would continue with outpatient follow up at short stay    LOS: Elaine @TODAY @10 :25 AM

## 2020-10-04 NOTE — Discharge Summary (Signed)
Physician Discharge Summary  Tiffany [name] OFB:[id number] DOB: 1950/10/13 DOA: 09/29/2020  PCP: Nolene Ebbs, MD  Admit date: 09/29/2020 Discharge date: 10/04/2020  Time spent: 50 minutes  Recommendations for Outpatient Follow-up:  Follow-up nephrology as outpatient, Follow-up infusion clinic for anemia   Discharge Diagnoses:  Principal Problem:   Symptomatic anemia Active Problems:   Hypothyroidism   Obstructive sleep apnea   Chronic diastolic heart failure ([place])   Essential hypertension   Chronic kidney disease, stage V ([place])   Type 2 diabetes mellitus without complications ([place])   Volume overload   Discharge Condition: Stable  Diet recommendation: Heart healthy diet  Filed Weights   10/02/20 0447 10/03/20 0548 10/04/20 0433  Weight: 111.5 kg 110 kg 105 kg    History of present illness:  70 year old female with a history of CKD stage V not on hemodialysis, chronic diastolic heart failure, hypertension, OSA on CPAP, asthma, hypothyroidism, diabetes mellitus type 2, Jehovah's Witness presented with chest pain and shortness of breath.  On presentation hemoglobin was 7.3, platelets 142, creatinine 3.67.  BNP was 470.  Chest x-ray showed improvement in pulmonary edema from prior.  Nephrology was consulted.  Hospital Course:  Symptomatic anemia/anemia of chronic disease -Patient has anemia of chronic disease from CKD stage V -She gets Aranesp per nephrology as outpatient every 2 weeks -Recent iron panel showed anemia of chronic disease.  FOBT was negative. -Today hemoglobin 7.0; patient is Jehovah witness so cannot give blood transfusion -She already received Aranesp 100 mcg on 10/03/2020 -Nephrology Dr. Justin Mend recommends to discharge patient and follow-up with nephrology as outpatient  Chest pain -Resolved; secondary to symptomatic anemia -EKG did not show significant ST-T changes -Patient can follow-up outpatient with cardiology.  Acute on chronic diastolic heart  failure -Chest x-ray on presentation showed improvement in pulmonary edema compared to chest x-ray done on 09/23/2020. -Strict intake and output, daily weights. -Patient was started on IV metolazone as well as IV Lasix. -Net -8.8 L -Patient will continue taking Torsemide 100 mg alternating with 150 mg every other day  Hypertension -Blood pressure is controlled -Continue home medications  Hypothyroidism -Continue Synthroid  Obstructive sleep apnea -Continue CPAP  Generalized conditioning -Patient was discharged to residential hospice in May 2022 but subsequently discharged from residential hospice because of improvement in general condition. -Patient is still chronically ill and deconditioned.  Palliative care evaluation appreciated. -PT recommends no PT follow-up  Procedures:   Consultations: Nephrology  Discharge Exam: Vitals:   10/04/20 0845 10/04/20 1353  BP:  139/62  Pulse:  64  Resp:  20  Temp:  98 F (36.7 C)  SpO2: 98% 99%    General: Appears in no acute distress Cardiovascular: S1-S2, regular, no murmur auscultated Respiratory: Clear to auscultation bilaterally  Discharge Instructions   Discharge Instructions     Diet - low sodium heart healthy   Complete by: As directed    Increase activity slowly   Complete by: As directed       Allergies as of 10/04/2020   No Known Allergies      Medication List     TAKE these medications    albuterol 108 (90 Base) MCG/ACT inhaler Commonly known as: VENTOLIN HFA Inhale 2 puffs into the lungs every 6 (six) hours as needed for wheezing or shortness of breath.   amLODipine 10 MG tablet Commonly known as: NORVASC Take 10 mg by mouth 2 (two) times daily.   aspirin EC 81 MG tablet Take 81 mg by mouth daily. Swallow  whole.   B-complex with vitamin C tablet Take 1 tablet by mouth daily.   budesonide-formoterol 160-4.5 MCG/ACT inhaler Commonly known as: SYMBICORT Inhale 2 puffs then rinse mouth, twice  daily   calcitRIOL 0.25 MCG capsule Commonly known as: ROCALTROL Take 0.25 mcg by mouth daily.   carvedilol 6.25 MG tablet Commonly known as: COREG Take 6.25 mg by mouth at bedtime.   cetirizine 10 MG tablet Commonly known as: ZYRTEC Take 10 mg by mouth daily.   cloNIDine 0.3 mg/24hr patch Commonly known as: CATAPRES - Dosed in mg/24 hr Place 0.3 mg onto the skin once a week.   ferrous sulfate 324 MG Tbec Take 324 mg by mouth daily.   Fish Oil 500 MG Caps Take 1 capsule by mouth daily at 12 noon.   hydrALAZINE 25 MG tablet Commonly known as: APRESOLINE Take 50 mg by mouth 3 (three) times daily.   levothyroxine 112 MCG tablet Commonly known as: SYNTHROID Take 224 mcg by mouth daily before breakfast.   LORazepam 1 MG tablet Commonly known as: ATIVAN Take 1 mg by mouth every 4 (four) hours as needed.   multivitamin with minerals Tabs tablet Take 1 tablet by mouth daily.   PreserVision AREDS Caps Take 1 tablet by mouth daily.   Systane Complete 0.6 % Soln Generic drug: Propylene Glycol Apply to eye.   torsemide 100 MG tablet Commonly known as: DEMADEX Take 100 mg by mouth daily. Alternate 100mg  QOD and 150mg  QOD   zolpidem 10 MG tablet Commonly known as: AMBIEN Take 10 mg by mouth at bedtime as needed for sleep.       No Known Allergies  Follow-up Information     Nolene Ebbs, MD. Go on 10/11/2020.   Specialty: Internal Medicine Why: Wednesday at 3:15 for your hospital follow up appointment Contact information: [place] [address] [phone]                  The results of significant diagnostics from this hospitalization (including imaging, microbiology, ancillary and laboratory) are listed below for reference.    Significant Diagnostic Studies: DG Chest 2 View  Result Date: 09/29/2020 CLINICAL DATA:  Chest pain, shortness of breath EXAM: CHEST - 2 VIEW COMPARISON:  Chest radiograph 09/25/2020 FINDINGS: The heart is  enlarged, unchanged. The mediastinal contours are stable. There is calcified atherosclerotic plaque of the aortic arch. There is vascular congestion without frank pulmonary edema, improved compared to the prior study. There is no focal consolidation. There is no pleural effusion or pneumothorax. There is no acute osseous abnormality. IMPRESSION: Unchanged cardiomegaly with improved pulmonary edema compared to the study from 09/25/2020. Electronically Signed   By: Valetta Mole M.D.   On: 09/29/2020 15:56   DG Chest 2 View  Result Date: 09/25/2020 CLINICAL DATA:  Chronic kidney disease EXAM: CHEST - 2 VIEW COMPARISON:  04/26/2020 FINDINGS: Cardiomegaly. Diffuse bilateral interstitial pulmonary opacity. The visualized skeletal structures are unremarkable. IMPRESSION: Cardiomegaly with diffuse bilateral interstitial pulmonary opacity, most consistent with pulmonary edema. Electronically Signed   By: Eddie Candle M.D.   On: 09/25/2020 15:49    Microbiology: Recent Results (from the past 240 hour(s))  SARS CORONAVIRUS 2 (TAT 6-24 HRS) Nasopharyngeal Nasopharyngeal Swab     Status: None   Collection Time: 09/30/20 12:03 AM   Specimen: Nasopharyngeal Swab  Result Value Ref Range Status   SARS Coronavirus 2 NEGATIVE NEGATIVE Final    Comment: (NOTE) SARS-CoV-2 target nucleic acids are NOT DETECTED.  The SARS-CoV-2  RNA is generally detectable in upper and lower respiratory specimens during the acute phase of infection. Negative results do not preclude SARS-CoV-2 infection, do not rule out co-infections with other pathogens, and should not be used as the sole basis for treatment or other patient management decisions. Negative results must be combined with clinical observations, patient history, and epidemiological information. The expected result is Negative.  Fact Sheet for Patients: SugarRoll.be  Fact Sheet for Healthcare  Providers: https://www.woods-mathews.com/  This test is not yet approved or cleared by the [place] FDA and  has been authorized for detection and/or diagnosis of SARS-CoV-2 by FDA under an Emergency Use Authorization (EUA). This EUA will remain  in effect (meaning this test can be used) for the duration of the COVID-19 declaration under Se ction 564(b)(1) of the Act, 21 U.S.C. section 360bbb-3(b)(1), unless the authorization is terminated or revoked sooner.  Performed at [facility], [place] [address], [place], [place] [address]      Labs: Basic Metabolic Panel: Recent Labs  Lab 09/30/20 1106 10/01/20 0743 10/02/20 0457 10/03/20 0523 10/04/20 0524  NA 140 144 141 141 144  K 4.7 4.3 4.1 3.8 3.6  CL 110 110 108 109 109  CO2 20* 22 22 21* 23  GLUCOSE 184* 142* 128* 124* 125*  BUN 105* 98* 111* 111* 116*  CREATININE 3.86* 3.66* 4.10* 3.87* 4.02*  CALCIUM 9.3 10.0 8.9 9.2 9.1   Liver Function Tests: No results for input(s): AST, ALT, ALKPHOS, BILITOT, PROT, ALBUMIN in the last 168 hours. No results for input(s): LIPASE, AMYLASE in the last 168 hours. No results for input(s): AMMONIA in the last 168 hours. CBC: Recent Labs  Lab 09/29/20 1525 09/30/20 0200 10/01/20 0743 10/04/20 0524  WBC 4.7 4.4 5.2 4.2  NEUTROABS 3.1  --  3.5 2.5  HGB 7.3* 7.6* 8.3* 7.0*  HCT 23.2* 24.3* 26.9* 22.4*  MCV 89.9 90.7 90.6 89.2  PLT 142* 151 156 98*   Cardiac Enzymes: No results for input(s): CKTOTAL, CKMB, CKMBINDEX, TROPONINI in the last 168 hours. BNP: BNP (last 3 results) Recent Labs    04/17/20 0907 04/26/20 1746 09/29/20 1525  BNP 508.0* 322.2* 470.6*    ProBNP (last 3 results) Recent Labs    01/25/20 1506  PROBNP 2,507*    CBG: Recent Labs  Lab 10/03/20 1143 10/03/20 1718 10/03/20 2128 10/04/20 0754 10/04/20 1157  GLUCAP 192* 160* 161* 116* 173*       Signed:  Oswald Hillock MD.  [facility] 10/04/2020, 2:15 PM

## 2020-10-18 ENCOUNTER — Encounter (HOSPITAL_COMMUNITY): Payer: Self-pay | Admitting: Gastroenterology

## 2020-10-18 ENCOUNTER — Encounter (HOSPITAL_COMMUNITY)
Admission: RE | Admit: 2020-10-18 | Discharge: 2020-10-18 | Disposition: A | Discharge status: Home / Self Care | Payer: Medicare Other | Source: Ambulatory Visit | Attending: Internal Medicine | Admitting: Internal Medicine

## 2020-10-18 ENCOUNTER — Other Ambulatory Visit: Payer: Self-pay

## 2020-10-18 VITALS — BP 133/66 | HR 55 | Temp 97.5°F

## 2020-10-18 DIAGNOSIS — N185 Chronic kidney disease, stage 5: Secondary | ICD-10-CM | POA: Insufficient documentation

## 2020-10-18 LAB — IRON AND TIBC
Iron: 68 ug/dL (ref 28–170)
Saturation Ratios: 24 % (ref 10.4–31.8)
TIBC: 279 ug/dL (ref 250–450)
UIBC: 211 ug/dL

## 2020-10-18 LAB — POCT HEMOGLOBIN-HEMACUE: Hemoglobin: 8.8 g/dL — ABNORMAL LOW (ref 12.0–15.0)

## 2020-10-18 LAB — FERRITIN: Ferritin: 652 ng/mL — ABNORMAL HIGH (ref 11–307)

## 2020-10-18 MED ORDER — EPOETIN ALFA-EPBX 40000 UNIT/ML IJ SOLN
40000.0000 [IU] | INTRAMUSCULAR | Status: DC
  Administered 2020-10-18: 40000 [IU] via SUBCUTANEOUS

## 2020-10-18 MED ORDER — EPOETIN ALFA-EPBX 40000 UNIT/ML IJ SOLN
INTRAMUSCULAR | Status: AC
  Filled 2020-10-18: qty 1

## 2020-10-24 ENCOUNTER — Ambulatory Visit (HOSPITAL_COMMUNITY): Payer: Medicare Other | Admitting: Anesthesiology

## 2020-10-24 ENCOUNTER — Encounter (HOSPITAL_COMMUNITY): Payer: Self-pay | Admitting: Gastroenterology

## 2020-10-24 ENCOUNTER — Other Ambulatory Visit: Payer: Self-pay

## 2020-10-24 ENCOUNTER — Ambulatory Visit (HOSPITAL_COMMUNITY)
Admission: RE | Admit: 2020-10-24 | Discharge: 2020-10-24 | Disposition: A | Discharge status: Home / Self Care | Payer: Medicare Other | Attending: Gastroenterology | Admitting: Gastroenterology

## 2020-10-24 ENCOUNTER — Encounter (HOSPITAL_COMMUNITY)
Admission: RE | Disposition: A | Discharge status: Home / Self Care | Payer: Self-pay | Source: Home / Self Care | Attending: Gastroenterology

## 2020-10-24 DIAGNOSIS — Z800 Family history of malignant neoplasm of digestive organs: Secondary | ICD-10-CM | POA: Insufficient documentation

## 2020-10-24 DIAGNOSIS — Z1211 Encounter for screening for malignant neoplasm of colon: Secondary | ICD-10-CM | POA: Insufficient documentation

## 2020-10-24 DIAGNOSIS — D128 Benign neoplasm of rectum: Secondary | ICD-10-CM | POA: Diagnosis not present

## 2020-10-24 DIAGNOSIS — Z8249 Family history of ischemic heart disease and other diseases of the circulatory system: Secondary | ICD-10-CM | POA: Insufficient documentation

## 2020-10-24 DIAGNOSIS — Z794 Long term (current) use of insulin: Secondary | ICD-10-CM | POA: Diagnosis not present

## 2020-10-24 DIAGNOSIS — Z8042 Family history of malignant neoplasm of prostate: Secondary | ICD-10-CM | POA: Diagnosis not present

## 2020-10-24 DIAGNOSIS — I5032 Chronic diastolic (congestive) heart failure: Secondary | ICD-10-CM | POA: Diagnosis not present

## 2020-10-24 DIAGNOSIS — Z882 Allergy status to sulfonamides status: Secondary | ICD-10-CM | POA: Diagnosis not present

## 2020-10-24 DIAGNOSIS — Z808 Family history of malignant neoplasm of other organs or systems: Secondary | ICD-10-CM | POA: Diagnosis not present

## 2020-10-24 DIAGNOSIS — I132 Hypertensive heart and chronic kidney disease with heart failure and with stage 5 chronic kidney disease, or end stage renal disease: Secondary | ICD-10-CM | POA: Insufficient documentation

## 2020-10-24 DIAGNOSIS — Z79890 Hormone replacement therapy (postmenopausal): Secondary | ICD-10-CM | POA: Insufficient documentation

## 2020-10-24 DIAGNOSIS — D123 Benign neoplasm of transverse colon: Secondary | ICD-10-CM | POA: Diagnosis not present

## 2020-10-24 DIAGNOSIS — N185 Chronic kidney disease, stage 5: Secondary | ICD-10-CM | POA: Insufficient documentation

## 2020-10-24 DIAGNOSIS — Z7982 Long term (current) use of aspirin: Secondary | ICD-10-CM | POA: Diagnosis not present

## 2020-10-24 DIAGNOSIS — E1122 Type 2 diabetes mellitus with diabetic chronic kidney disease: Secondary | ICD-10-CM | POA: Insufficient documentation

## 2020-10-24 DIAGNOSIS — K5730 Diverticulosis of large intestine without perforation or abscess without bleeding: Secondary | ICD-10-CM | POA: Insufficient documentation

## 2020-10-24 DIAGNOSIS — Z79899 Other long term (current) drug therapy: Secondary | ICD-10-CM | POA: Diagnosis not present

## 2020-10-24 HISTORY — PX: COLONOSCOPY WITH PROPOFOL: SHX5780

## 2020-10-24 HISTORY — PX: POLYPECTOMY: SHX5525

## 2020-10-24 LAB — GLUCOSE, CAPILLARY: Glucose-Capillary: 114 mg/dL — ABNORMAL HIGH (ref 70–99)

## 2020-10-24 SURGERY — COLONOSCOPY WITH PROPOFOL
Anesthesia: Monitor Anesthesia Care

## 2020-10-24 MED ORDER — LIDOCAINE HCL 1 % IJ SOLN
INTRAMUSCULAR | Status: DC | PRN
  Administered 2020-10-24: 50 mg via INTRADERMAL

## 2020-10-24 MED ORDER — PROPOFOL 10 MG/ML IV BOLUS
INTRAVENOUS | Status: DC | PRN
  Administered 2020-10-24: 20 mg via INTRAVENOUS
  Administered 2020-10-24 (×2): 10 mg via INTRAVENOUS

## 2020-10-24 MED ORDER — SODIUM CHLORIDE 0.9 % IV SOLN
INTRAVENOUS | Status: DC
  Administered 2020-10-24: 500 mL via INTRAVENOUS

## 2020-10-24 MED ORDER — PROPOFOL 500 MG/50ML IV EMUL
INTRAVENOUS | Status: AC
  Filled 2020-10-24: qty 50

## 2020-10-24 MED ORDER — PROPOFOL 500 MG/50ML IV EMUL
INTRAVENOUS | Status: DC | PRN
  Administered 2020-10-24: 115 ug/kg/min via INTRAVENOUS

## 2020-10-24 SURGICAL SUPPLY — 21 items

## 2020-10-24 NOTE — Op Note (Signed)
[facility] Patient Name: Tiffany [name] Procedure Date: 10/24/2020 MRN: [id number] Attending MD: Ronnette Juniper , MD Date of Birth: 08-18-1950 CSN: [id number] Age: 70 Admit Type: Outpatient Procedure:                Colonoscopy Indications:              High risk colon cancer surveillance: Personal                            history of non-advanced adenoma, Last colonoscopy:                            2016, Incidental - Unexplained iron deficiency                            anemia Providers:                Ronnette Juniper, MD, Doristine Johns, RN, Laverda Sorenson, Technician, Karis Juba, CRNA Referring MD:             [place] Medicines:                Monitored Anesthesia Care Complications:            No immediate complications. Estimated blood loss:                            Minimal. Estimated Blood Loss:     Estimated blood loss was minimal. Procedure:                Pre-Anesthesia Assessment:                           - Prior to the procedure, a History and Physical                            was performed, and patient medications and                            allergies were reviewed. The patient's tolerance of                            previous anesthesia was also reviewed. The risks                            and benefits of the procedure and the sedation                            options and risks were discussed with the patient.                            All questions were answered, and informed consent                            was obtained. Prior Anticoagulants: The patient  has                            taken no previous anticoagulant or antiplatelet                            agents. ASA Grade Assessment: III - A patient with                            severe systemic disease. After reviewing the risks                            and benefits, the patient was deemed in                            satisfactory condition to  undergo the procedure.                           After obtaining informed consent, the colonoscope                            was passed under direct vision. Throughout the                            procedure, the patient's blood pressure, pulse, and                            oxygen saturations were monitored continuously. The                            [id number]-HQ190L ([id number]) Olympus colonoscope was                            introduced through the anus and advanced to the the                            cecum, identified by appendiceal orifice and                            ileocecal valve. The colonoscopy was performed                            without difficulty. The patient tolerated the                            procedure well. The quality of the bowel                            preparation was good. Scope In: 9:36:29 AM Scope Out: 9:57:16 AM Scope Withdrawal Time: 0 hours 9 minutes 6 seconds  Total Procedure Duration: 0 hours 20 minutes 47 seconds  Findings:      The perianal and digital rectal examinations were normal.      Six sessile polyps were found in the transverse colon. The polyps were 5  to 9 mm in size. These polyps were removed with a hot snare and cold       biopsy forceps. Resection and retrieval were complete.      A 7 mm polyp was found in the rectum. The polyp was sessile. The polyp       was removed with a hot snare. Resection and retrieval were complete.      Multiple small and large-mouthed diverticula were found in the sigmoid       colon and descending colon.      The exam was otherwise without abnormality on direct and retroflexion       views. Impression:               - Six 5 to 9 mm polyps in the transverse colon,                            removed with a hot snare. Resected and retrieved.                           - One 7 mm polyp in the rectum, removed with a hot                            snare. Resected and retrieved.                            - Diverticulosis in the sigmoid colon and in the                            descending colon.                           - The examination was otherwise normal on direct                            and retroflexion views. Moderate Sedation:      Patient did not receive moderate sedation for this procedure, but       instead received monitored anesthesia care. Recommendation:           - Patient has a contact number available for                            emergencies. The signs and symptoms of potential                            delayed complications were discussed with the                            patient. Return to normal activities tomorrow.                            Written discharge instructions were provided to the                            patient.                           -  High fiber diet.                           - Continue present medications.                           - Await pathology results.                           - Repeat colonoscopy for surveillance based on                            pathology results. Procedure Code(s):        --- Professional ---                           [phone], Colonoscopy, flexible; with removal of                            tumor(s), polyp(s), or other lesion(s) by snare                            technique Diagnosis Code(s):        --- Professional ---                           Z86.010, Personal history of colonic polyps                           K63.5, Polyp of colon                           K62.1, Rectal polyp                           K57.30, Diverticulosis of large intestine without                            perforation or abscess without bleeding CPT copyright 2019 American Medical Association. All rights reserved. The codes documented in this report are preliminary and upon coder review may  be revised to meet current compliance requirements. Ronnette Juniper, MD 10/24/2020 10:02:17 AM This report has been signed  electronically. Number of Addenda: 0

## 2020-10-24 NOTE — Discharge Instructions (Signed)

## 2020-10-24 NOTE — Anesthesia Preprocedure Evaluation (Addendum)
Anesthesia Evaluation  Patient identified by MRN, date of birth, ID band Patient awake    Reviewed: Allergy & Precautions, NPO status , Patient's Chart, lab work & pertinent test results  History of Anesthesia Complications Negative for: history of anesthetic complications  Airway Mallampati: II  TM Distance: >3 FB Neck ROM: Full    Dental  (+) Dental Advisory Given   Pulmonary sleep apnea and Continuous Positive Airway Pressure Ventilation ,    Pulmonary exam normal        Cardiovascular hypertension, Pt. on medications +CHF  Normal cardiovascular exam+ Valvular Problems/Murmurs    '22 TTE - EF 65 to 70%. Severe concentric left ventricular  Hypertrophy. Grade II diastolic dysfunction (pseudonormalization). Mildly elevated pulmonary artery systolic pressure. The estimated right ventricular systolic pressure is 20.3 mmHg. Left atrial size was moderately dilated. Right atrial size was mildly dilated. A small pericardial effusion is present. The pericardial effusion is circumferential. There is no evidence of cardiac tamponade. Mild mitral valve regurgitation. Mild mitral stenosis. Mild to moderate aortic valve sclerosis / calcification is present, without any evidence of aortic stenosis.     Neuro/Psych negative neurological ROS  negative psych ROS   GI/Hepatic negative GI ROS, Neg liver ROS,   Endo/Other  diabetes, Type 2, Insulin DependentHypothyroidism  Obesity   Renal/GU CRFRenal disease     Musculoskeletal  (+) Arthritis ,   Abdominal   Peds  Hematology  (+) anemia , REFUSES BLOOD PRODUCTS, JEHOVAH'S WITNESS  Anesthesia Other Findings   Reproductive/Obstetrics                            Anesthesia Physical Anesthesia Plan  ASA: 3  Anesthesia Plan: MAC   Post-op Pain Management:    Induction:   PONV Risk Score and Plan: 2 and Propofol infusion and Treatment may vary due to age or  medical condition  Airway Management Planned: Nasal Cannula and Natural Airway  Additional Equipment: None  Intra-op Plan:   Post-operative Plan:   Informed Consent: I have reviewed the patients History and Physical, chart, labs and discussed the procedure including the risks, benefits and alternatives for the proposed anesthesia with the patient or authorized representative who has indicated his/her understanding and acceptance.       Plan Discussed with: CRNA and Anesthesiologist  Anesthesia Plan Comments:        Anesthesia Quick Evaluation

## 2020-10-24 NOTE — Anesthesia Postprocedure Evaluation (Signed)
Anesthesia Post Note  Patient: Tiffany [name]  Procedure(s) Performed: COLONOSCOPY WITH PROPOFOL POLYPECTOMY     Patient location during evaluation: PACU Anesthesia Type: MAC Level of consciousness: awake and alert Pain management: pain level controlled Vital Signs Assessment: post-procedure vital signs reviewed and stable Respiratory status: spontaneous breathing, nonlabored ventilation and respiratory function stable Cardiovascular status: stable and blood pressure returned to baseline Anesthetic complications: no   No notable events documented.  Last Vitals:  Vitals:   10/24/20 1010 10/24/20 1020  BP: (!) 135/46 (!) 145/52  Pulse: (!) 51 (!) 48  Resp: (!) 26 (!) 24  Temp:    SpO2: 99% 98%    Last Pain:  Vitals:   10/24/20 1020  TempSrc:   PainSc: 0-No pain                 Audry Pili

## 2020-10-24 NOTE — H&P (Signed)
History of Present Illness  General:          70/female        Colon : 2016- 2 tubular adenomas removed, repeat recommended in 5 years        Discharge summary from 06/07/20 states         she has a past medical history of diabetes mellitus, essential hypertension obstructive sleep apnea, chronic diastolic heart failure, chronic kidney disease stage V refused hemodialysis, comes into the ED complaining of abdominal pain and multiple episodes of nausea and vomiting.         In the ED he was noted to have a creatinine of 9.4, BUN of 200,         CT scan of the abdomen did not show any obstruction, but it did show constipation started on MiraLAX.        Was discharged to Hospice facility for 2 weeks and 1 day(on [place]) and discharged home on 06/22/20.        She makes urine several times a day.        She used to see a neprhologist at [facility] and was advsied to stop seeing them.        She was diagnosed with CKD in 2007, stage 3 when diagnosed.        She adamantly refused hemodialysis due to the "quality of life being redued to zero".         She has 1 Bm every 2 days, stools are soft, denies blood in stool or black stools,denies abdominal or rectal pain.        She has intentional weight loss of about 30 lbs in a few months, by portion and diet control, no exercise.        She has good appetite.        Denies nausea or vomiting, acid reflux or heartburn, denies difficulty or pain on swallowing.        Her grandmother had colon cancer in her 32s.        Her labs from [place](Endocrinologist, off [employer] week and her Cr was around 3.        Denies SOB, dizziness, chest pain or leg swelling.     Current Medications  Taking  Calcitriol 0.25 MCG Capsule 1 capsule Orally Three times a Week    Carvedilol 6.25 MG Tablet 1 tablet with food Orally Twice a day    PreserVision AREDS - Capsule as directed Orally , Notes: bid    hydrALAZINE HCl 25 MG Tablet 1  tablet with food Orally Three times a day    Zolpidem Tartrate 10 MG Tablet 1 tablet at bedtime Orally Once a day PRN    Norvasc(amLODIPine Besylate) 10 MG Tablet 1 tablet Orally twice a day---Dr A Powell    Torsemide 100 MG Tablet as directed Orally daily    Allopurinol 100 MG Tablet 1 tablet Orally Twice a day    MVI as directed     Crestor(Rosuvastatin Calcium) 10 MG Tablet 1 tablet Orally Once a day, Notes: followed by Dr Unknown Jim    cloNIDine HCl 0.3 MG Tablet 1 patch Orally Once a week    Aspirin EC 81 MG Tablet Delayed Release 1 tablet Orally Once a day    Synthroid 200 MCG(L-Thyroxine Sodium) 200 MCG Tablet 1 tablet every morning on an empty stomach Orally alternating with 225 mcg daily    Vitamin B Complex - Tablet as directed  Orally    Not-Taking  Lantus(Insulin Glargine) Solution 30 units Subcutaneous hs    Topiramate 100 MG Tablet 1 tablet at bedtime Orally daily    Medication List reviewed and reconciled with the patient         Past Medical History        Heart murmur.         Sleep apnea.         Diabetes mellitus.         Hypertension with LVH by echo 2007.         Chronic kidney disease.        Surgical History         breast reduction          knee surgery, left x2, right x1          toe surgery, left and right          anal fissure          Back surgery-Herniated disc          Back surgery-Fusion          colonoscopy 07/10/2004,09/2014         LT knee replacement        Family History   Father: deceased, prostate and bone cancer   Mother: deceased, heart disease   Maternal Grand Mother: colon cancer   grandmother colon cancer.       Social History  General:   Tobacco use       cigarettes:  Never smoked     Tobacco history last updated  07/10/2020 no EXPOSURE TO PASSIVE SMOKE.  no Alcohol.  no Recreational drug use.  no EDUCATION.  OCCUPATION: school bus driver.      Allergies   Sulfa  drugs (for allergy): rash - Allergy       Hospitalization/Major Diagnostic Procedure    Review of Systems  GI PROCEDURE:          no Pacemaker/ AICD, no.  no Artificial heart valves.  no MI/heart attack.  no Abnormal heart rhythm.  no Angina.  no CVA.  Hypertension  YES.  no Hypotension.  no Asthma, COPD.  Sleep apnea  YES, using CPAP.  no Seizure disorders.  no Artificial joints.  no Severe DJD.  Diabetes  YES, YES, type II.  no Significant headaches.  no Vertigo.  no Depression/anxiety.  Abnormal bleeding  YES, aspirin.  Kidney Disease  yes.  no Liver disease, no.  no Chance of pregnancy.  Blood transfusion  yes, 1979.         Vital Signs  Wt 221.9, Wt change -76.6 lb, Ht 69.5, BMI 32.30, Temp 98, Pulse sitting 77, BP sitting 180/88.     Examination  Gastroenterology::        GENERAL APPEARANCE: Well developed, well nourished, no active distress, pleasant.         SCLERA: anicteric.         CARDIOVASCULAR Normal RRR .         RESPIRATORY Breath sounds normal. Respiration even and unlabored.         ABDOMEN No masses palpated. Liver and spleen not palpated, normal. Bowel sounds normal, Abdomen not distended.         EXTREMITIES: No edema.         NEURO: alert, oriented to time, place and person, normal gait.         PSYCH: mood/affect normal.       Assessments  1. History of adenomatous polyp of colon - Z86.010 (Primary)   2. CKD (chronic kidney disease) stage 5, GFR less than 15 ml/min - N18.5     Treatment   1. History of adenomatous polyp of colon         IMAGING: Colonoscopy Notes: Patient was due for a surveillance colonoscopy in 2021. Since she has chronic kidney disease but is not in hemodialysis, I would recommend for the procedure to be done as an outpatient at [facility]. I had a long discussion with the patient regarding chronic kidney disease and possibility of having dyselectrolytemia which increases the risk of anesthesia and procedure. Patient does not  want hemodialysis however wants to continue surveillance colonoscopies.  Plan on getting Basic metabolic panel drawn the day of procedure,to identify if potassium is within normal limits.  The risks and benefits of the procedure were discussed with the patient in details. She understands and verbalizes consent. She will be given written instructions, prescription for preparation will be scheduled for the same.        2. CKD (chronic kidney disease) stage 5, GFR less than 15 ml/min   Notes: She refused hemodialysis and states that last week showed a Cr of 3. Will schedule for surveillance colonoscopy as an outpatient at [facility] but will need BMP to be drawn the morning of the procedure, prior to the procedure. Will get labs performed at her endocrinologist office, performed last week, as patient states her creatinine was 3 at that time.

## 2020-10-24 NOTE — Transfer of Care (Signed)
Immediate Anesthesia Transfer of Care Note  Patient: Tiffany [name]  Procedure(s) Performed: COLONOSCOPY WITH PROPOFOL POLYPECTOMY  Patient Location: PACU and Endoscopy Unit  Anesthesia Type:MAC  Level of Consciousness: awake, alert , oriented and patient cooperative  Airway & Oxygen Therapy: Patient Spontanous Breathing and Patient connected to face mask oxygen  Post-op Assessment: Report given to RN and Post -op Vital signs reviewed and stable  Post vital signs: Reviewed and stable  Last Vitals:  Vitals Value Taken Time  BP    Temp    Pulse 50 10/24/20 1004  Resp 21 10/24/20 1004  SpO2 100 % 10/24/20 1004  Vitals shown include unvalidated device data.  Last Pain:  Vitals:   10/24/20 0859  TempSrc: Oral  PainSc: 0-No pain         Complications: No notable events documented.

## 2020-10-25 ENCOUNTER — Encounter (HOSPITAL_COMMUNITY): Payer: Self-pay | Admitting: Gastroenterology

## 2020-10-25 LAB — SURGICAL PATHOLOGY

## 2020-10-30 ENCOUNTER — Telehealth: Payer: Self-pay | Admitting: Physical Medicine and Rehabilitation

## 2020-10-30 DIAGNOSIS — M5416 Radiculopathy, lumbar region: Secondary | ICD-10-CM

## 2020-10-30 NOTE — Telephone Encounter (Signed)
Right L5-S1 IL on 6/23. Ok to repeat if helped, same problem/side, and no new injury?

## 2020-10-30 NOTE — Telephone Encounter (Signed)
Patient called. She would like an appointment with Dr. Ernestina Patches. Her call back number is [phone]

## 2020-10-31 NOTE — Telephone Encounter (Signed)
Referral placed. Patient scheduled.

## 2020-10-31 NOTE — Addendum Note (Signed)
Addended by: Sherre Scarlet B on: 10/31/2020 11:25 AM   Modules accepted: Orders

## 2020-11-01 ENCOUNTER — Encounter (HOSPITAL_COMMUNITY)
Admission: RE | Admit: 2020-11-01 | Discharge: 2020-11-01 | Disposition: A | Discharge status: Home / Self Care | Payer: Medicare Other | Source: Ambulatory Visit | Attending: Internal Medicine | Admitting: Internal Medicine

## 2020-11-01 ENCOUNTER — Encounter (HOSPITAL_COMMUNITY): Payer: Self-pay

## 2020-11-01 VITALS — BP 135/62 | HR 54 | Temp 97.8°F | Resp 20

## 2020-11-01 DIAGNOSIS — N185 Chronic kidney disease, stage 5: Secondary | ICD-10-CM | POA: Diagnosis not present

## 2020-11-01 LAB — RENAL FUNCTION PANEL
Albumin: 3.6 g/dL (ref 3.5–5.0)
Anion gap: 11 (ref 5–15)
BUN: 101 mg/dL — ABNORMAL HIGH (ref 8–23)
CO2: 20 mmol/L — ABNORMAL LOW (ref 22–32)
Calcium: 9.6 mg/dL (ref 8.9–10.3)
Chloride: 108 mmol/L (ref 98–111)
Creatinine, Ser: 3.96 mg/dL — ABNORMAL HIGH (ref 0.44–1.00)
GFR, Estimated: 12 mL/min — ABNORMAL LOW (ref >60)
Glucose, Bld: 115 mg/dL — ABNORMAL HIGH (ref 70–99)
Phosphorus: 5.2 mg/dL — ABNORMAL HIGH (ref 2.5–4.6)
Potassium: 4.4 mmol/L (ref 3.5–5.1)
Sodium: 139 mmol/L (ref 135–145)

## 2020-11-01 LAB — POCT HEMOGLOBIN-HEMACUE: Hemoglobin: 9.5 g/dL — ABNORMAL LOW (ref 12.0–15.0)

## 2020-11-01 MED ORDER — EPOETIN ALFA-EPBX 40000 UNIT/ML IJ SOLN
INTRAMUSCULAR | Status: AC
  Filled 2020-11-01: qty 1

## 2020-11-01 MED ORDER — EPOETIN ALFA-EPBX 40000 UNIT/ML IJ SOLN
40000.0000 [IU] | INTRAMUSCULAR | Status: DC
  Administered 2020-11-01: 40000 [IU] via SUBCUTANEOUS

## 2020-11-02 LAB — PTH, INTACT AND CALCIUM
Calcium, Total (PTH): 9.5 mg/dL (ref 8.7–10.3)
PTH: 137 pg/mL — ABNORMAL HIGH (ref 15–65)

## 2020-11-04 DIAGNOSIS — B029 Zoster without complications: Secondary | ICD-10-CM

## 2020-11-04 HISTORY — DX: Zoster without complications: B02.9

## 2020-11-09 ENCOUNTER — Other Ambulatory Visit: Payer: Self-pay

## 2020-11-09 ENCOUNTER — Ambulatory Visit: Payer: Medicare Other | Admitting: Physical Medicine and Rehabilitation

## 2020-11-09 ENCOUNTER — Encounter: Payer: Self-pay | Admitting: Physical Medicine and Rehabilitation

## 2020-11-09 ENCOUNTER — Ambulatory Visit: Payer: Self-pay

## 2020-11-09 VITALS — BP 143/82 | HR 56

## 2020-11-09 DIAGNOSIS — Z981 Arthrodesis status: Secondary | ICD-10-CM | POA: Diagnosis not present

## 2020-11-09 DIAGNOSIS — M546 Pain in thoracic spine: Secondary | ICD-10-CM | POA: Diagnosis not present

## 2020-11-09 DIAGNOSIS — M7918 Myalgia, other site: Secondary | ICD-10-CM

## 2020-11-09 DIAGNOSIS — M545 Low back pain, unspecified: Secondary | ICD-10-CM

## 2020-11-09 DIAGNOSIS — M5416 Radiculopathy, lumbar region: Secondary | ICD-10-CM

## 2020-11-09 DIAGNOSIS — G8929 Other chronic pain: Secondary | ICD-10-CM

## 2020-11-09 MED ORDER — METHYLPREDNISOLONE ACETATE 80 MG/ML IJ SUSP
80.0000 mg | Freq: Once | INTRAMUSCULAR | Status: AC
  Administered 2020-11-09: 80 mg

## 2020-11-09 NOTE — Progress Notes (Signed)
Pt state lower back pain that travels to her buttocks. Pt state walking and standing makes the pain worse. Pt state she takes over the counter pain meds to help ease her pain. Pt has hx of inj on 07/27/20 it helped but didn't last long.  Numeric Pain Rating Scale and Functional Assessment Average Pain 8   In the last MONTH (on 0-10 scale) has pain interfered with the following?  1. General activity like being  able to carry out your everyday physical activities such as walking, climbing stairs, carrying groceries, or moving a chair?  Rating(10)   +Driver, -BT, -Dye Allergies.

## 2020-11-09 NOTE — Patient Instructions (Signed)

## 2020-11-12 ENCOUNTER — Encounter: Payer: Self-pay | Admitting: Physical Medicine and Rehabilitation

## 2020-11-12 NOTE — Procedures (Signed)
Lumbar Epidural Steroid Injection - Interlaminar Approach with Fluoroscopic Guidance  Patient: Tiffany [name]      Date of Birth: Apr 25, 1950 MRN: [id number] PCP: Nolene Ebbs, MD      Visit Date: 11/09/2020   Universal Protocol:     Consent Given By: the patient  Position: PRONE  Additional Comments: Vital signs were monitored before and after the procedure. Patient was prepped and draped in the usual sterile fashion. The correct patient, procedure, and site was verified.   Injection Procedure Details:   Procedure diagnoses: Lumbar radiculopathy [M54.16]   Meds Administered:  Meds ordered this encounter  Medications   methylPREDNISolone acetate (DEPO-MEDROL) injection 80 mg     Laterality: Right  Location/Site:  L5-S1  Needle: 3.5 in., 20 ga. Tuohy  Needle Placement: Paramedian epidural  Findings:   -Comments: Excellent flow of contrast into the epidural space.  Procedure Details: Using a paramedian approach from the side mentioned above, the region overlying the inferior lamina was localized under fluoroscopic visualization and the soft tissues overlying this structure were infiltrated with 4 ml. of 1% Lidocaine without Epinephrine. The Tuohy needle was inserted into the epidural space using a paramedian approach.   The epidural space was localized using loss of resistance along with counter oblique bi-planar fluoroscopic views.  After negative aspirate for air, blood, and CSF, a 2 ml. volume of Isovue-250 was injected into the epidural space and the flow of contrast was observed. Radiographs were obtained for documentation purposes.    The injectate was administered into the level noted above.   Additional Comments:  The patient tolerated the procedure well Dressing: 2 x 2 sterile gauze and Band-Aid    Post-procedure details: Patient was observed during the procedure. Post-procedure instructions were reviewed.  Patient left the clinic in stable condition.

## 2020-11-12 NOTE — Progress Notes (Signed)
Tiffany [name] - 70 y.o. female MRN [id number]  Date of birth: 02-14-1950  Office Visit Note: Visit Date: 11/09/2020 PCP: Nolene Ebbs, MD Referred by: Nolene Ebbs, MD  Subjective: Chief Complaint  Patient presents with   Lower Back - Pain   HPI: Tiffany [name] is a 70 y.o. female who comes in todayFor evaluation and management of chronic severe recalcitrant low back pain with referral to the buttocks with walking and standing.  She also complains of a secondary problem today which is chronic worsening thoracic back pain.  In terms of her lower back she reports an average of 8 out of 10 pain but with 10 out of 10 disruption in daily activities.  She has pain despite continued medication management and home exercise.  She has had therapy in the past but not recently.  She denies any specific red flag complaints of focal weakness or bowel or bladder changes etc.  We saw her in June and completed epidural injection which we have done in the past and she said this time it did help but just did not last very long.  She does not note any reason that it came back any sooner.  She has had no trauma.  By way of review of seeing the patient since 2018 and saw her at the request of Dr. Neomia Dear because of insurance difficulties.  Her history is such that she has had prior lumbar instrumented fusion at L3-4 and L4-5 by Dr. Arnoldo Morale in 2003 I believe.  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  Some lateral recess narrowing at L5-S1 which fits more with her symptoms.  Secondary problem today is chronic thoracic back pain.  She is complained about this in the past to me but has been minor.  She does feel like it is increasing over time and is really across the lower shoulder blade region.  This is worse pretty much all the time sometimes with sitting or standing.  She has had no trouble breathing.  Again no trauma.  No specific therapy on her  thoracic spine.  She did have some prior imaging in the past that was reviewed.  Her case is complicated by type 2 diabetes with congestive heart failure as well as obstructive sleep apnea and chronic stage V kidney disease.  Review of Systems  Musculoskeletal:  Positive for back pain and joint pain.  All other systems reviewed and are negative. Otherwise per HPI.  Assessment & Plan: Visit Diagnoses:    ICD-10-CM   1. Lumbar radiculopathy  M54.16 XR C-ARM NO REPORT    Epidural Steroid injection    methylPREDNISolone acetate (DEPO-MEDROL) injection 80 mg    Ambulatory referral to Physical Therapy    2. S/P lumbar fusion  Z98.1 Ambulatory referral to Physical Therapy    3. Chronic bilateral low back pain without sciatica  M54.50 Ambulatory referral to Physical Therapy   G89.29     4. Chronic bilateral thoracic back pain  M54.6 Ambulatory referral to Physical Therapy   G89.29     5. Myofascial pain syndrome  M79.18 Ambulatory referral to Physical Therapy       Plan: Findings:  1.  Chronic severe recalcitrant low back pain referral into the buttocks bilaterally consistent with neurogenic claudication type symptoms but could also be consistent with gluteus medius and quadratus lumborum myofascial pain with prior history of lumbar fusion this does occur at times.  She does have adjacent  level disease on MRI from 2019.  At this point I think repeating the L5-S1 injection is warranted.  We will do that today given the amount of pain that she is having.  Depending on relief with that I would be quick to repeat MRI of the lumbar spine to look for more adjacent level disease versus injecting transforaminal at L2-3.  She is really not a surgical candidate but could consider spinal cord stimulator trial.  2.  Thoracic back pain I feel is myofascial in nature.  She may have underlying central sensitization syndrome as well.  Can make referral for physical therapy in house for both lower back and  thoracic spine.   Meds & Orders:  Meds ordered this encounter  Medications   methylPREDNISolone acetate (DEPO-MEDROL) injection 80 mg    Orders Placed This Encounter  Procedures   XR C-ARM NO REPORT   Ambulatory referral to Physical Therapy   Epidural Steroid injection    Follow-up: Return if symptoms worsen or fail to improve.   Procedures: No procedures performed  Lumbar Epidural Steroid Injection - Interlaminar Approach with Fluoroscopic Guidance  Patient: Tiffany [name]      Date of Birth: [birth year] MRN: [id number] PCP: Nolene Ebbs, MD      Visit Date: 11/09/2020   Universal Protocol:     Consent Given By: the patient  Position: PRONE  Additional Comments: Vital signs were monitored before and after the procedure. Patient was prepped and draped in the usual sterile fashion. The correct patient, procedure, and site was verified.   Injection Procedure Details:   Procedure diagnoses: Lumbar radiculopathy [M54.16]   Meds Administered:  Meds ordered this encounter  Medications   methylPREDNISolone acetate (DEPO-MEDROL) injection 80 mg     Laterality: Right  Location/Site:  L5-S1  Needle: 3.5 in., 20 ga. Tuohy  Needle Placement: Paramedian epidural  Findings:   -Comments: Excellent flow of contrast into the epidural space.  Procedure Details: Using a paramedian approach from the side mentioned above, the region overlying the inferior lamina was localized under fluoroscopic visualization and the soft tissues overlying this structure were infiltrated with 4 ml. of 1% Lidocaine without Epinephrine. The Tuohy needle was inserted into the epidural space using a paramedian approach.   The epidural space was localized using loss of resistance along with counter oblique bi-planar fluoroscopic views.  After negative aspirate for air, blood, and CSF, a 2 ml. volume of Isovue-250 was injected into the epidural space and the flow of contrast was observed.  Radiographs were obtained for documentation purposes.    The injectate was administered into the level noted above.   Additional Comments:  The patient tolerated the procedure well Dressing: 2 x 2 sterile gauze and Band-Aid    Post-procedure details: Patient was observed during the procedure. Post-procedure instructions were reviewed.  Patient left the clinic in stable condition.    Clinical History: MRI LUMBAR SPINE WITHOUT CONTRAST   TECHNIQUE: Multiplanar, multisequence MR imaging of the lumbar spine was performed. No intravenous contrast was administered.   COMPARISON: 11/05/2012   FINDINGS: Segmentation: 5 lumbar type vertebral bodies assumed.   Alignment: No malalignment.   Vertebrae: No fracture or primary bone lesion.   Conus medullaris and cauda equina: Conus extends to the L1 level. Conus and cauda equina appear normal.   Paraspinal and other soft tissues: Simple appearing renal cysts.   Disc levels:   Degenerative disc disease at T9-10 and T10-11 with disc bulges but no apparent compressive stenosis. Those  levels were not studied in detail.   T12-L1 and L1-2 are normal.   L2-3: Mild bulging of the disc. Facet degeneration with ligamentous hypertrophy and joint effusions. Mild narrowing of the canal but no compressive stenosis in this position. This appearance could worsen with standing or flexion, based on the morphology of the facet arthropathy. This has worsened since 2014.   L3-4: Previous PLIF is solid with wide patency of the canal and foramina.   L4-5: Previous left hemilaminectomy. Endplate osteophytes and mild bulging of the disc. No compressive stenosis.   L5-S1: Central disc protrusion contacts the thecal sac in the S1 root sleeves as they bud from the thecal sac, though definite neural compression is not demonstrated. Foramina appear sufficiently patent. Mild bilateral facet osteoarthritis. Similar appearance to the study of 2014.    IMPRESSION: Since 2014, there has been worsening of adjacent segment degenerative disease at L2-3. The disc bulges mildly. There is bilateral facet arthropathy with ligamentous hypertrophy and fluid-filled joints. There is mild stenosis of the canal in this position, but this appearance would likely worsen with standing or flexion.   Good appearance at the previous discectomy and fusion level of L3-4.   Satisfactory appearance at the previous surgical level of L4-5 with previous left hemilaminectomy. Mild degenerative changes without compressive stenosis.   L5-S1 shows a chronic central disc protrusion and mild facet degeneration but no apparent compressive stenosis or change since,2014.     Electronically Signed By: Nelson Chimes M.D. On: 09/28/2017 07:00   She reports that she has never smoked. She has never used smokeless tobacco.  Recent Labs    02/02/20 0415 04/26/20 2150 09/30/20 0117  HGBA1C 5.7* 6.3* 5.8*    Objective:  VS:  HT:    WT:   BMI:     BP: (!) 143/82  HR: (!) 56bpm  TEMP: ( )  RESP:  Physical Exam Vitals and nursing note reviewed.  Constitutional:      General: She is not in acute distress.    Appearance: Normal appearance. She is obese. She is not ill-appearing.  HENT:     Head: Normocephalic and atraumatic.     Right Ear: External ear normal.     Left Ear: External ear normal.  Eyes:     Extraocular Movements: Extraocular movements intact.  Cardiovascular:     Rate and Rhythm: Normal rate.     Pulses: Normal pulses.  Pulmonary:     Effort: Pulmonary effort is normal. No respiratory distress.  Abdominal:     General: There is no distension.     Palpations: Abdomen is soft.  Musculoskeletal:        General: Tenderness present.     Cervical back: Neck supple.     Right lower leg: No edema.     Left lower leg: No edema.     Comments: Patient has good distal strength with no pain over the greater trochanters.  No clonus or focal  weakness. Patient somewhat slow to rise from a seated position to full extension.  There is concordant low back pain with facet loading and lumbar spine extension rotation.  There are no definitive trigger points but the patient is somewhat tender across the lower back and PSIS.  There is no pain with hip rotation.   Skin:    Findings: No erythema, lesion or rash.  Neurological:     General: No focal deficit present.     Mental Status: She is alert and oriented to person, place,  and time.     Sensory: No sensory deficit.     Motor: No weakness or abnormal muscle tone.     Coordination: Coordination normal.  Psychiatric:        Mood and Affect: Mood normal.        Behavior: Behavior normal.    Ortho Exam  Imaging: No results found.  Past Medical/Family/Surgical/Social History: Medications & Allergies reviewed per EMR, new medications updated. Patient Active Problem List   Diagnosis Date Noted   Volume overload 09/30/2020   Symptomatic anemia 09/29/2020   ESRD (end stage renal disease) ([place]) 06/04/2020   Acute on chronic diastolic (congestive) heart failure ([place]) 04/26/2020   Type 2 diabetes mellitus with stage 5 chronic kidney disease ([place]) 04/26/2020   Acute CHF (congestive heart failure) ([place]) 02/01/2020   S/P left TKA 07/01/2019   Status post total left knee replacement 07/01/2019   Hyperlipidemia 09/12/2016   Type 2 diabetes mellitus without complications ([place]) 72/53/6644   Tachycardia 08/26/2016   Palpitation 08/26/2016   SOB (shortness of breath) 08/26/2016   Chronic kidney disease, stage V ([place]) 04/28/2014   Chronic diastolic heart failure ([place]) 05/05/2013   Essential hypertension 05/05/2013   HEART MURMUR, SYSTOLIC 03/47/4259   Hypothyroidism 02/12/2007   OBESITY 01/08/2007   Obstructive sleep apnea 01/08/2007   Seasonal and perennial allergic rhinitis 01/08/2007   Past Medical History:  Diagnosis Date   ALLERGIC RHINITIS    Arthritis    CHF (congestive heart  failure) ([place])    Chronic diastolic heart failure ([place]) 05/05/2013   Echo 03/2018:  EF 56-38, +diastolic dysfunction, GLS -20.5%, normal RVSF, RVSP 42.5 (mild elevated), severe LAE, small pericardial eff, mild MAc, mod AV calc, mild PI   CKD (chronic kidney disease)    Diabetes mellitus    Dyspnea    Heart murmur    at birth   Hyperlipidemia    Hypertension    Hypothyroid    Iron deficiency anemia    Obesity    OSA on CPAP    Previous back surgery    x 2   Refusal of blood transfusions as patient is Jehovah's Witness    S/P arthroscopy    Family History  Problem Relation Age of Onset   Prostate cancer Father    Congestive Heart Failure Mother    Diabetes Sister    Other Sister        septis   Breast cancer Neg Hx    Past Surgical History:  Procedure Laterality Date   BREAST BIOPSY  20+ yrs ago   dont know which breast per pt; benign   BREAST REDUCTION SURGERY  1982   COLONOSCOPY WITH PROPOFOL N/A 10/24/2020   Procedure: COLONOSCOPY WITH PROPOFOL;  Surgeon: Ronnette Juniper, MD;  Location: [facility];  Service: Gastroenterology;  Laterality: N/A;   KNEE SURGERY     x 2   LUMBAR DISC SURGERY     x 2   POLYPECTOMY  10/24/2020   Procedure: POLYPECTOMY;  Surgeon: Ronnette Juniper, MD;  Location: [facility];  Service: Gastroenterology;;   REDUCTION MAMMAPLASTY Bilateral 1985   [place]   partial hysterectomy- still has ovaries   TOTAL KNEE ARTHROPLASTY Left 07/01/2019   Procedure: TOTAL KNEE ARTHROPLASTY;  Surgeon: Paralee Cancel, MD;  Location: [facility];  Service: Orthopedics;  Laterality: Left;  70 min NO BLOOD PRODUCTS!!   TREATMENT FISTULA ANAL     Social History   Occupational History  Occupation: school bus driver  Tobacco Use   Smoking status: Never   Smokeless tobacco: Never  Vaping Use   Vaping Use: Never used  Substance and Sexual Activity   Alcohol use: Yes    Alcohol/week: 0.0 standard drinks    Comment: rare   Drug use:  No   Sexual activity: Not Currently    Birth control/protection: Surgical

## 2020-11-14 ENCOUNTER — Encounter (HOSPITAL_COMMUNITY)
Admission: RE | Admit: 2020-11-14 | Discharge: 2020-11-14 | Disposition: A | Discharge status: Home / Self Care | Payer: Medicare Other | Source: Ambulatory Visit | Attending: Internal Medicine | Admitting: Internal Medicine

## 2020-11-14 VITALS — BP 151/71 | HR 56 | Temp 97.6°F | Resp 20

## 2020-11-14 DIAGNOSIS — N185 Chronic kidney disease, stage 5: Secondary | ICD-10-CM | POA: Diagnosis present

## 2020-11-14 LAB — IRON AND TIBC
Iron: 53 ug/dL (ref 28–170)
Saturation Ratios: 20 % (ref 10.4–31.8)
TIBC: 270 ug/dL (ref 250–450)
UIBC: 217 ug/dL

## 2020-11-14 LAB — FERRITIN: Ferritin: 455 ng/mL — ABNORMAL HIGH (ref 11–307)

## 2020-11-14 LAB — POCT HEMOGLOBIN-HEMACUE: Hemoglobin: 10.7 g/dL — ABNORMAL LOW (ref 12.0–15.0)

## 2020-11-14 MED ORDER — EPOETIN ALFA-EPBX 40000 UNIT/ML IJ SOLN
40000.0000 [IU] | INTRAMUSCULAR | Status: DC
  Administered 2020-11-14: 40000 [IU] via SUBCUTANEOUS

## 2020-11-14 MED ORDER — EPOETIN ALFA-EPBX 40000 UNIT/ML IJ SOLN
INTRAMUSCULAR | Status: AC
  Filled 2020-11-14: qty 1

## 2020-11-15 ENCOUNTER — Encounter (HOSPITAL_COMMUNITY): Payer: Medicare Other

## 2020-11-20 ENCOUNTER — Ambulatory Visit (HOSPITAL_COMMUNITY)
Admission: EM | Admit: 2020-11-20 | Discharge: 2020-11-20 | Disposition: A | Discharge status: Home / Self Care | Payer: Medicare Other | Attending: Physician Assistant | Admitting: Physician Assistant

## 2020-11-20 ENCOUNTER — Other Ambulatory Visit: Payer: Self-pay

## 2020-11-20 ENCOUNTER — Encounter (HOSPITAL_COMMUNITY): Payer: Self-pay | Admitting: Emergency Medicine

## 2020-11-20 DIAGNOSIS — B029 Zoster without complications: Secondary | ICD-10-CM | POA: Diagnosis not present

## 2020-11-20 DIAGNOSIS — N185 Chronic kidney disease, stage 5: Secondary | ICD-10-CM | POA: Diagnosis not present

## 2020-11-20 DIAGNOSIS — I10 Essential (primary) hypertension: Secondary | ICD-10-CM

## 2020-11-20 MED ORDER — VALACYCLOVIR HCL 500 MG PO TABS: 500.0000 mg | ORAL_TABLET | Freq: Every day | ORAL | 0 refills | Status: DC

## 2020-11-20 NOTE — ED Provider Notes (Signed)
[place]    CSN: [id number] Arrival date & time: 11/20/20  0803      History   Chief Complaint Chief Complaint  Patient presents with   Rash    HPI Tiffany [name] is a 70 y.o. female.   Patient presents today with a 2-day history of painful rash on the left side of her body.  She reports developing blisters within a few days which has associated pain.  Pain is rated 8 on a 0-10 pain scale, described as a burning sensation, worse with palpation or touch, no alleviating factors identified.  She has not tried any over-the-counter medications for symptom management.  She denies any additional symptoms including fever, nausea, vomiting, spread of rash.  Denies episodes of similar symptoms.  She denies history of shingles and has not had shingles vaccine.  Blood pressure is noted to be elevated.  Patient believes this is related to pain.  She does have hypertension and has been taking medication as prescribed.  He denies any chest pain, shortness of breath, leg swelling, vision changes, headache, dizziness.  Denies any increase in sodium consumption, decongestant use, caffeine.   Past Medical History:  Diagnosis Date   ALLERGIC RHINITIS    Arthritis    CHF (congestive heart failure) (HCC)    Chronic diastolic heart failure ([place]) 05/05/2013   Echo 03/2018:  EF 25-42, +diastolic dysfunction, GLS -20.5%, normal RVSF, RVSP 42.5 (mild elevated), severe LAE, small pericardial eff, mild MAc, mod AV calc, mild PI   CKD (chronic kidney disease)    Diabetes mellitus    Dyspnea    Heart murmur    at birth   Hyperlipidemia    Hypertension    Hypothyroid    Iron deficiency anemia    Obesity    OSA on CPAP    Previous back surgery    x 2   Refusal of blood transfusions as patient is Jehov[employer]'s Witness    S/P arthroscopy     Patient Active Problem List   Diagnosis Date Noted   Volume overload 09/30/2020   Symptomatic anemia 09/29/2020   ESRD (end stage renal disease)  ([place]) 06/04/2020   Acute on chronic diastolic (congestive) heart failure ([place]) 04/26/2020   Type 2 diabetes mellitus with stage 5 chronic kidney disease ([place]) 04/26/2020   Acute CHF (congestive heart failure) ([place]) 02/01/2020   S/P left TKA 07/01/2019   Status post total left knee replacement 07/01/2019   Hyperlipidemia 09/12/2016   Type 2 diabetes mellitus without complications ([place]) 70/62/3762   Tachycardia 08/26/2016   Palpitation 08/26/2016   SOB (shortness of breath) 08/26/2016   Chronic kidney disease, stage V ([place]) 04/28/2014   Chronic diastolic heart failure ([place]) 05/05/2013   Essential hypertension 05/05/2013   HEART MURMUR, SYSTOLIC 83/15/1761   Hypothyroidism 02/12/2007   OBESITY 01/08/2007   Obstructive sleep apnea 01/08/2007   Seasonal and perennial allergic rhinitis 01/08/2007    Past Surgical History:  Procedure Laterality Date   BREAST BIOPSY  20+ yrs ago   dont know which breast per pt; benign   BREAST REDUCTION SURGERY  1982   COLONOSCOPY WITH PROPOFOL N/A 10/24/2020   Procedure: COLONOSCOPY WITH PROPOFOL;  Surgeon: Ronnette Juniper, MD;  Location: [facility];  Service: Gastroenterology;  Laterality: N/A;   KNEE SURGERY     x 2   LUMBAR DISC SURGERY     x 2   POLYPECTOMY  10/24/2020   Procedure: POLYPECTOMY;  Surgeon: Ronnette Juniper, MD;  Location: [facility];  Service: Gastroenterology;;   REDUCTION MAMMAPLASTY Bilateral 1985   TOE SURGERY     TOTAL ABDOMINAL HYSTERECTOMY  1997   partial hysterectomy- still has ovaries   TOTAL KNEE ARTHROPLASTY Left 07/01/2019   Procedure: TOTAL KNEE ARTHROPLASTY;  Surgeon: Paralee Cancel, MD;  Location: [facility];  Service: Orthopedics;  Laterality: Left;  70 min NO BLOOD PRODUCTS!!   TREATMENT FISTULA ANAL      OB History   No obstetric history on file.      Home Medications    Prior to Admission medications   Medication Sig Start Date End Date Taking? Authorizing Provider  valACYclovir (VALTREX) 500 MG tablet Take 1  tablet (500 mg total) by mouth daily. 11/20/20  Yes Barbarajean Kinzler K, PA-C  albuterol (VENTOLIN HFA) 108 (90 Base) MCG/ACT inhaler Inhale 2 puffs into the lungs every 6 (six) hours as needed for wheezing or shortness of breath.    [provider]  amLODipine (NORVASC) 10 MG tablet Take 10 mg by mouth 2 (two) times daily.    [provider]  aspirin EC 81 MG tablet Take 81 mg by mouth every evening. Swallow whole.    [provider]  B Complex-C (B-COMPLEX WITH VITAMIN C) tablet Take 1 tablet by mouth in the morning.    [provider]  budesonide-formoterol [facility]) 160-4.5 MCG/ACT inhaler Inhale 2 puffs then rinse mouth, twice daily 09/25/20   Baird Lyons D, MD  calcitRIOL (ROCALTROL) 0.5 MCG capsule Take 0.5 mcg by mouth in the morning. 08/03/20   [provider]  carvedilol (COREG) 12.5 MG tablet Take 12.5 mg by mouth 2 (two) times daily. 09/15/20   [provider]  cetirizine (ZYRTEC) 10 MG tablet Take 10 mg by mouth daily as needed for allergies.    [provider]  cloNIDine (CATAPRES - DOSED IN MG/24 HR) 0.3 mg/24hr patch Place 0.3 mg onto the skin every Friday.    [provider]  hydrALAZINE (APRESOLINE) 50 MG tablet Take 50 mg by mouth in the morning. 08/10/20   [provider]  LANTUS SOLOSTAR 100 UNIT/ML Solostar Pen Inject 15 Units into the skin at bedtime. 08/08/20   [provider]  levothyroxine (SYNTHROID) 112 MCG tablet Take 224 mcg by mouth daily before breakfast.    [provider]  Multiple Vitamins-Minerals (PRESERVISION AREDS 2 PO) Take 1 tablet by mouth in the morning.    [provider]  Omega-3 Fatty Acids (FISH OIL) 500 MG CAPS Take 1 capsule by mouth at bedtime.    [provider]  Propylene Glycol (SYSTANE COMPLETE) 0.6 % SOLN Place 1 drop into both eyes in the morning, at noon, in the evening, and at bedtime.    [provider]  torsemide (DEMADEX)  100 MG tablet Take 100-150 mg by mouth See admin instructions. Take 1 tablet (100 mg) by mouth every other day alternating with taking 1.5 tablets (150 mg) by mouth every other day.    [provider]  zolpidem (AMBIEN) 10 MG tablet Take 10 mg by mouth at bedtime.    [provider]    Family History Family History  Problem Relation Age of Onset   Prostate cancer Father    Congestive Heart Failure Mother    Diabetes Sister    Other Sister        septis   Breast cancer Neg Hx     Social History Social History   Tobacco Use   Smoking status: Never  Smokeless tobacco: Never  Vaping Use   Vaping Use: Never used  Substance Use Topics   Alcohol use: Yes    Alcohol/week: 0.0 standard drinks    Comment: rare   Drug use: No     Allergies   Patient has no known allergies.   Review of Systems Review of Systems  Constitutional:  Positive for activity change. Negative for appetite change, fatigue and fever.  Eyes:  Negative for visual disturbance.  Respiratory:  Negative for cough and shortness of breath.   Cardiovascular:  Negative for chest pain.  Gastrointestinal:  Negative for abdominal pain, diarrhea, nausea and vomiting.  Musculoskeletal:  Negative for arthralgias and myalgias.  Skin:  Positive for rash.  Neurological:  Negative for dizziness, light-headedness and headaches.    Physical Exam Triage Vital Signs ED Triage Vitals  Enc Vitals Group     BP 11/20/20 0821 (!) 179/89     Pulse Rate 11/20/20 0821 (!) 59     Resp 11/20/20 0821 16     Temp 11/20/20 0821 98.1 F (36.7 C)     Temp Source 11/20/20 0821 Oral     SpO2 11/20/20 0821 98 %     Weight --      Height --      Head Circumference --      Peak Flow --      Pain Score 11/20/20 0820 8     Pain Loc --      Pain Edu? --      Excl. in [place]? --    No data found.  Updated Vital Signs BP (!) 160/85 (BP Location: Left Arm)   Pulse (!) 56   Temp 98.1 F (36.7 C) (Oral)   Resp 16    SpO2 98%   Visual Acuity Right Eye Distance:   Left Eye Distance:   Bilateral Distance:    Right Eye Near:   Left Eye Near:    Bilateral Near:     Physical Exam Vitals reviewed.  Constitutional:      General: She is awake. She is not in acute distress.    Appearance: Normal appearance. She is well-developed. She is not ill-appearing.     Comments: Very pleasant female appears stated age no acute distress sitting comfortably in exam room  HENT:     Head: Normocephalic and atraumatic.  Cardiovascular:     Rate and Rhythm: Normal rate and regular rhythm.     Heart sounds: Normal heart sounds, S1 normal and S2 normal. No murmur heard. Pulmonary:     Effort: Pulmonary effort is normal.     Breath sounds: Normal breath sounds. No wheezing, rhonchi or rales.     Comments: Clear to auscultation bilaterally Abdominal:     Palpations: Abdomen is soft.     Tenderness: There is no abdominal tenderness.  Musculoskeletal:     Right lower leg: No edema.     Left lower leg: No edema.  Skin:    Findings: Rash present. Rash is vesicular.     Comments: Vesicular rash noted in left T6 dermatome.  No bleeding or drainage noted.  No streaking or evidence of lymphangitis  Psychiatric:        Behavior: Behavior is cooperative.       UC Treatments / Results  Labs (all labs ordered are listed, but only abnormal results are displayed) Labs Reviewed - No data to display  EKG   Radiology No results found.  Procedures Procedures (including critical care time)  Medications Ordered in UC Medications - No data to display  Initial Impression / Assessment and Plan / UC Course  I have reviewed the triage vital signs and the nursing notes.  Pertinent labs & imaging results that were available during my care of the patient were reviewed by me and considered in my medical decision making (see chart for details).      Symptoms consistent with shingles.  Patient was started on Valtrex 500  mg daily which was renally adjusted given calculated creatinine clearance of less than 18 mL/min from metabolic panel from 3/76/2831.  Recommend she use Tylenol for pain relief.  Discussed the importance of keeping area clean to avoid secondary infection.  Discussed alarm symptoms that warrant emergent evaluation.  Strict return precautions given to which she expressed understanding.  Blood pressure is elevated today.  Patient denies any signs/symptoms of endorgan damage.  She was instructed to avoid caffeine, sodium, decongestants.  She is to take her medication as prescribed and keep track of her blood pressure.  If persistently elevated she is to follow-up with our clinic or her PCP for medication adjustment.  If she develops any chest pain, shortness of breath, headache, dizziness, vision changes in setting of high blood pressure she must go to the ER to which she expressed understanding.  Final Clinical Impressions(s) / UC Diagnoses   Final diagnoses:  Herpes zoster without complication  Chronic kidney disease, stage V (HCC)  Elevated blood pressure reading in office with diagnosis of hypertension     Discharge Instructions      Your symptoms are consistent with shingles.  Please take Valtrex 500 mg daily.  This dose was adjusted based on your kidney function.  You can use Tylenol for pain relief.  If you have any worsening symptoms including spread of rash, fever, nausea, vomiting, body aches you need to be reevaluated.  Follow-up with your primary care provider within a week to ensure symptom improvement.  Your blood pressure is very elevated.  Please monitor this at home and take your medication as prescribed.  If you develop chest pain, shortness of breath, headache, dizziness, vision changes in the setting of high blood pressure you need to go to the emergency room as we discussed.     ED Prescriptions     Medication Sig Dispense Auth. Provider   valACYclovir (VALTREX) 500 MG  tablet Take 1 tablet (500 mg total) by mouth daily. 7 tablet Alwyn Cordner, Derry Skill, PA-C      PDMP not reviewed this encounter.   Terrilee Croak, PA-C 11/20/20 [id number]

## 2020-11-20 NOTE — Discharge Instructions (Addendum)
Your symptoms are consistent with shingles.  Please take Valtrex 500 mg daily.  This dose was adjusted based on your kidney function.  You can use Tylenol for pain relief.  If you have any worsening symptoms including spread of rash, fever, nausea, vomiting, body aches you need to be reevaluated.  Follow-up with your primary care provider within a week to ensure symptom improvement.  Your blood pressure is very elevated.  Please monitor this at home and take your medication as prescribed.  If you develop chest pain, shortness of breath, headache, dizziness, vision changes in the setting of high blood pressure you need to go to the emergency room as we discussed.

## 2020-11-20 NOTE — ED Triage Notes (Signed)
Pt presents with rash on left side of body Xs 2 days.

## 2020-11-28 ENCOUNTER — Encounter (HOSPITAL_COMMUNITY): Payer: Medicare Other

## 2020-12-18 ENCOUNTER — Encounter (HOSPITAL_COMMUNITY)
Admission: RE | Admit: 2020-12-18 | Discharge: 2020-12-18 | Disposition: A | Discharge status: Home / Self Care | Payer: Medicare Other | Source: Ambulatory Visit | Attending: Internal Medicine | Admitting: Internal Medicine

## 2020-12-18 ENCOUNTER — Other Ambulatory Visit: Payer: Self-pay

## 2020-12-18 VITALS — BP 152/71 | HR 61

## 2020-12-18 DIAGNOSIS — N185 Chronic kidney disease, stage 5: Secondary | ICD-10-CM | POA: Insufficient documentation

## 2020-12-18 LAB — IRON AND TIBC
Iron: 67 ug/dL (ref 28–170)
Saturation Ratios: 28 % (ref 10.4–31.8)
TIBC: 244 ug/dL — ABNORMAL LOW (ref 250–450)
UIBC: 177 ug/dL

## 2020-12-18 LAB — RENAL FUNCTION PANEL
Albumin: 3.4 g/dL — ABNORMAL LOW (ref 3.5–5.0)
Anion gap: 13 (ref 5–15)
BUN: 95 mg/dL — ABNORMAL HIGH (ref 8–23)
CO2: 21 mmol/L — ABNORMAL LOW (ref 22–32)
Calcium: 9.5 mg/dL (ref 8.9–10.3)
Chloride: 108 mmol/L (ref 98–111)
Creatinine, Ser: 3.81 mg/dL — ABNORMAL HIGH (ref 0.44–1.00)
GFR, Estimated: 12 mL/min — ABNORMAL LOW (ref >60)
Glucose, Bld: 136 mg/dL — ABNORMAL HIGH (ref 70–99)
Phosphorus: 4.8 mg/dL — ABNORMAL HIGH (ref 2.5–4.6)
Potassium: 4.3 mmol/L (ref 3.5–5.1)
Sodium: 142 mmol/L (ref 135–145)

## 2020-12-18 LAB — FERRITIN: Ferritin: 605 ng/mL — ABNORMAL HIGH (ref 11–307)

## 2020-12-18 LAB — POCT HEMOGLOBIN-HEMACUE: Hemoglobin: 9.5 g/dL — ABNORMAL LOW (ref 12.0–15.0)

## 2020-12-18 MED ORDER — EPOETIN ALFA-EPBX 40000 UNIT/ML IJ SOLN
40000.0000 [IU] | INTRAMUSCULAR | Status: DC
  Administered 2020-12-18: 40000 [IU] via SUBCUTANEOUS

## 2020-12-18 MED ORDER — EPOETIN ALFA-EPBX 40000 UNIT/ML IJ SOLN
INTRAMUSCULAR | Status: AC
  Filled 2020-12-18: qty 1

## 2020-12-19 LAB — PTH, INTACT AND CALCIUM
Calcium, Total (PTH): 9.3 mg/dL (ref 8.7–10.3)
PTH: 260 pg/mL — ABNORMAL HIGH (ref 15–65)

## 2020-12-27 ENCOUNTER — Other Ambulatory Visit: Payer: Self-pay

## 2020-12-27 ENCOUNTER — Encounter (HOSPITAL_COMMUNITY): Payer: Self-pay

## 2020-12-27 ENCOUNTER — Ambulatory Visit (HOSPITAL_COMMUNITY)
Admission: EM | Admit: 2020-12-27 | Discharge: 2020-12-27 | Disposition: A | Discharge status: Home / Self Care | Payer: Medicare Other | Attending: Internal Medicine | Admitting: Internal Medicine

## 2020-12-27 DIAGNOSIS — I10 Essential (primary) hypertension: Secondary | ICD-10-CM | POA: Insufficient documentation

## 2020-12-27 DIAGNOSIS — R350 Frequency of micturition: Secondary | ICD-10-CM | POA: Diagnosis not present

## 2020-12-27 DIAGNOSIS — R109 Unspecified abdominal pain: Secondary | ICD-10-CM | POA: Insufficient documentation

## 2020-12-27 DIAGNOSIS — B0223 Postherpetic polyneuropathy: Secondary | ICD-10-CM | POA: Insufficient documentation

## 2020-12-27 LAB — POCT URINALYSIS DIPSTICK, ED / UC
Bilirubin Urine: NEGATIVE
Glucose, UA: NEGATIVE mg/dL
Hgb urine dipstick: NEGATIVE
Ketones, ur: NEGATIVE mg/dL
Leukocytes,Ua: NEGATIVE
Nitrite: NEGATIVE
Protein, ur: >=300 mg/dL — AB
Specific Gravity, Urine: 1.02 (ref 1.005–1.030)
Urobilinogen, UA: 0.2 mg/dL (ref 0.0–1.0)
pH: 7 (ref 5.0–8.0)

## 2020-12-27 MED ORDER — LIDOCAINE 5 % EX PTCH: 1.0000 | MEDICATED_PATCH | CUTANEOUS | 0 refills | Status: DC

## 2020-12-27 NOTE — ED Triage Notes (Signed)
Pt present urinary frequency with tingling sensation while urinating. Symptoms started two weeks ago.  Pt complains of left side weakness.

## 2020-12-27 NOTE — Discharge Instructions (Addendum)
I believe that you are having pain related to your recent shingles outbreak.  Please use lidocaine patches as prescribed.  You can use Tylenol as well.  Follow-up with your primary care doctor as we discussed.  There was no evidence of an infection on your urine.  We will contact you if your culture shows that you have an infection we will start antibiotic.  Make sure you are drinking plenty of fluid.  If you have any worsening symptoms please return for reevaluation.  Your blood pressure is very elevated today.  Please keep taking your medication as prescribed.  Monitor your blood pressure at home and if this is persistently elevated you need to be reevaluated for medication adjustment.  Avoid decongestants, NSAIDs, caffeine, sodium.  If you develop any chest pain, shortness of breath, headache, dizziness vision changes in the setting of high blood pressure you need to go to the emergency room.

## 2020-12-27 NOTE — ED Provider Notes (Signed)
[facility]    CSN: [id number] Arrival date & time: 12/27/20  1237      History   Chief Complaint Chief Complaint  Patient presents with   Urinary Frequency    HPI Tiffany [name] is a 70 y.o. female.   Patient presents today with several concerns.  She reports a 2-week history of left flank pain following recent diagnosis of shingles.  Patient was seen and diagnosed with shingles on 11/20/2020 at which when she was started on Valtrex.  Reports that lesions and rash quickly resolved but she continues to have a gnawing pain in the same distribution.  She has been taking Tylenol but has not tried additional medications.  At one point she was prescribed gabapentin but had significant side effects with this so stopped taking this approximately 1 week ago.  She is unable to take NSAIDs due to chronic kidney disease.  Denies any known injury or change in activity prior to symptom onset.  In addition she reports a several week history of urinary frequency.  Denies any dysuria, urgency, hematuria, abdominal pain, nausea, vomiting.  She denies any recent antibiotic use.  Denies any recent urogenital procedure.  Denies history of self-catheterization, recurrent urinary tract infections, seeing urologist.  Blood pressure is noted to be elevated today.  She attributes this to associated pain.  She has taken blood pressure medication as prescribed.  She typically monitors this at home and states it is generally around 140/90.  Denies any chest pain, shortness of breath, leg swelling, headache, dizziness.  Denies any change to diet including increased caffeine or sodium consumption.  She does not take NSAIDs or decongestants on a regular basis.   Past Medical History:  Diagnosis Date   ALLERGIC RHINITIS    Arthritis    CHF (congestive heart failure) (HCC)    Chronic diastolic heart failure ([place]) 05/05/2013   Echo 03/2018:  EF 74-08, +diastolic dysfunction, GLS -20.5%, normal RVSF, RVSP  42.5 (mild elevated), severe LAE, small pericardial eff, mild MAc, mod AV calc, mild PI   CKD (chronic kidney disease)    Diabetes mellitus    Dyspnea    Heart murmur    at birth   Hyperlipidemia    Hypertension    Hypothyroid    Iron deficiency anemia    Obesity    OSA on CPAP    Previous back surgery    x 2   Refusal of blood transfusions as patient is Jehovah's Witness    S/P arthroscopy     Patient Active Problem List   Diagnosis Date Noted   Volume overload 09/30/2020   Symptomatic anemia 09/29/2020   ESRD (end stage renal disease) ([place]) 06/04/2020   Acute on chronic diastolic (congestive) heart failure ([place]) 04/26/2020   Type 2 diabetes mellitus with stage 5 chronic kidney disease ([place]) 04/26/2020   Acute CHF (congestive heart failure) ([place]) 02/01/2020   S/P left TKA 07/01/2019   Status post total left knee replacement 07/01/2019   Hyperlipidemia 09/12/2016   Type 2 diabetes mellitus without complications ([place]) 14/48/1856   Tachycardia 08/26/2016   Palpitation 08/26/2016   SOB (shortness of breath) 08/26/2016   Chronic kidney disease, stage V ([place]) 04/28/2014   Chronic diastolic heart failure ([place]) 05/05/2013   Essential hypertension 05/05/2013   HEART MURMUR, SYSTOLIC 31/49/7026   Hypothyroidism 02/12/2007   OBESITY 01/08/2007   Obstructive sleep apnea 01/08/2007   Seasonal and perennial allergic rhinitis 01/08/2007    Past Surgical History:  Procedure Laterality  Date   BREAST BIOPSY  20+ yrs ago   dont know which breast per pt; benign   BREAST REDUCTION SURGERY  1982   COLONOSCOPY WITH PROPOFOL N/A 10/24/2020   Procedure: COLONOSCOPY WITH PROPOFOL;  Surgeon: Ronnette Juniper, MD;  Location: [facility];  Service: Gastroenterology;  Laterality: N/A;   KNEE SURGERY     x 2   LUMBAR DISC SURGERY     x 2   POLYPECTOMY  10/24/2020   Procedure: POLYPECTOMY;  Surgeon: Ronnette Juniper, MD;  Location: [facility];  Service: Gastroenterology;;   REDUCTION MAMMAPLASTY  Bilateral 1985   [place]   partial hysterectomy- still has ovaries   TOTAL KNEE ARTHROPLASTY Left 07/01/2019   Procedure: TOTAL KNEE ARTHROPLASTY;  Surgeon: Paralee Cancel, MD;  Location: [facility];  Service: Orthopedics;  Laterality: Left;  70 min NO BLOOD PRODUCTS!!   TREATMENT FISTULA ANAL      OB History   No obstetric history on file.      Home Medications    Prior to Admission medications   Medication Sig Start Date End Date Taking? Authorizing Provider  lidocaine (LIDODERM) 5 % Place 1 patch onto the skin daily. Remove & Discard patch within 12 hours or as directed by MD 12/27/20  Yes Marvyn Torrez, Junie Panning K, PA-C  albuterol (VENTOLIN HFA) 108 (90 Base) MCG/ACT inhaler Inhale 2 puffs into the lungs every 6 (six) hours as needed for wheezing or shortness of breath.    [provider]  amLODipine (NORVASC) 10 MG tablet Take 10 mg by mouth 2 (two) times daily.    [provider]  aspirin EC 81 MG tablet Take 81 mg by mouth every evening. Swallow whole.    [provider]  B Complex-C (B-COMPLEX WITH VITAMIN C) tablet Take 1 tablet by mouth in the morning.    [provider]  budesonide-formoterol [facility]) 160-4.5 MCG/ACT inhaler Inhale 2 puffs then rinse mouth, twice daily 09/25/20   Baird Lyons D, MD  calcitRIOL (ROCALTROL) 0.5 MCG capsule Take 0.5 mcg by mouth in the morning. 08/03/20   [provider]  carvedilol (COREG) 12.5 MG tablet Take 12.5 mg by mouth 2 (two) times daily. 09/15/20   [provider]  cetirizine (ZYRTEC) 10 MG tablet Take 10 mg by mouth daily as needed for allergies.    [provider]  cloNIDine (CATAPRES - DOSED IN MG/24 HR) 0.3 mg/24hr patch Place 0.3 mg onto the skin every Friday.    [provider]  hydrALAZINE (APRESOLINE) 50 MG tablet Take 50 mg by mouth in the morning. 08/10/20   [provider]  LANTUS SOLOSTAR 100 UNIT/ML Solostar Pen  Inject 15 Units into the skin at bedtime. 08/08/20   [provider]  levothyroxine (SYNTHROID) 112 MCG tablet Take 224 mcg by mouth daily before breakfast.    [provider]  Multiple Vitamins-Minerals (PRESERVISION AREDS 2 PO) Take 1 tablet by mouth in the morning.    [provider]  Omega-3 Fatty Acids (FISH OIL) 500 MG CAPS Take 1 capsule by mouth at bedtime.    [provider]  Propylene Glycol (SYSTANE COMPLETE) 0.6 % SOLN Place 1 drop into both eyes in the morning, at noon, in the evening, and at bedtime.    [provider]  torsemide (DEMADEX) 100 MG tablet Take 100-150 mg by mouth See admin instructions. Take 1 tablet (100 mg) by mouth every other day alternating with taking  1.5 tablets (150 mg) by mouth every other day.    [provider]  valACYclovir (VALTREX) 500 MG tablet Take 1 tablet (500 mg total) by mouth daily. 11/20/20   Kaicen Desena, Derry Skill, PA-C  zolpidem (AMBIEN) 10 MG tablet Take 10 mg by mouth at bedtime.    [provider]    Family History Family History  Problem Relation Age of Onset   Prostate cancer Father    Congestive Heart Failure Mother    Diabetes Sister    Other Sister        septis   Breast cancer Neg Hx     Social History Social History   Tobacco Use   Smoking status: Never   Smokeless tobacco: Never  Vaping Use   Vaping Use: Never used  Substance Use Topics   Alcohol use: Yes    Alcohol/week: 0.0 standard drinks    Comment: rare   Drug use: No     Allergies   Sulfa antibiotics   Review of Systems Review of Systems  Constitutional:  Positive for activity change. Negative for appetite change, fatigue and fever.  Eyes:  Negative for visual disturbance.  Respiratory:  Negative for cough and shortness of breath.   Cardiovascular:  Negative for chest pain.  Gastrointestinal:  Positive for abdominal pain. Negative for diarrhea, nausea and vomiting.  Genitourinary:  Positive for  frequency. Negative for dysuria and urgency.  Musculoskeletal:  Negative for arthralgias and myalgias.  Neurological:  Negative for dizziness, light-headedness and headaches.    Physical Exam Triage Vital Signs ED Triage Vitals  Enc Vitals Group     BP 12/27/20 1502 (!) 206/84     Pulse Rate 12/27/20 1502 63     Resp 12/27/20 1502 16     Temp 12/27/20 1502 98.6 F (37 C)     Temp Source 12/27/20 1502 Oral     SpO2 12/27/20 1502 100 %     Weight --      Height --      Head Circumference --      Peak Flow --      Pain Score 12/27/20 1500 8     Pain Loc --      Pain Edu? --      Excl. in [place]? --    No data found.  Updated Vital Signs BP (!) 193/90 (BP Location: Left Arm)   Pulse 60   Temp 98.6 F (37 C) (Oral)   Resp 16   SpO2 96%   Visual Acuity Right Eye Distance:   Left Eye Distance:   Bilateral Distance:    Right Eye Near:   Left Eye Near:    Bilateral Near:     Physical Exam Vitals reviewed.  Constitutional:      General: She is awake. She is not in acute distress.    Appearance: Normal appearance. She is well-developed. She is not ill-appearing.     Comments: Very pleasant female appears stated age in no acute distress sitting comfortably in exam room  HENT:     Head: Normocephalic and atraumatic.  Cardiovascular:     Rate and Rhythm: Normal rate and regular rhythm.     Heart sounds: Normal heart sounds, S1 normal and S2 normal. No murmur heard. Pulmonary:     Effort: Pulmonary effort is normal.     Breath sounds: Normal breath sounds. No wheezing, rhonchi or rales.     Comments: Clear to auscultation bilaterally Abdominal:     General:  Bowel sounds are normal.     Palpations: Abdomen is soft.     Tenderness: There is no abdominal tenderness. There is no right CVA tenderness, left CVA tenderness, guarding or rebound.     Comments: Benign abdominal exam  Skin:    Findings: Rash present. Rash is macular.     Comments: Macular rash noted along the left  flank with hypopigmentation scarring but no acute lesions.  Psychiatric:        Behavior: Behavior is cooperative.     UC Treatments / Results  Labs (all labs ordered are listed, but only abnormal results are displayed) Labs Reviewed  POCT URINALYSIS DIPSTICK, ED / UC - Abnormal; Notable for the following components:      Result Value   Protein, ur >=300 (*)    All other components within normal limits  URINE CULTURE    EKG   Radiology No results found.  Procedures Procedures (including critical care time)  Medications Ordered in UC Medications - No data to display  Initial Impression / Assessment and Plan / UC Course  I have reviewed the triage vital signs and the nursing notes.  Pertinent labs & imaging results that were available during my care of the patient were reviewed by me and considered in my medical decision making (see chart for details).     Symptoms are likely related to polyneuralgia secondary to recent herpes zoster infection.  Patient is taking gabapentin but unable to tolerate this due to side effects.  Unfortunately, she has a history of chronic kidney disease so is unable to take NSAIDs.  Recommended she use lidocaine patches and was given prescription for this to be used 1 patch per day.  Recommend she follow-up with PCP to consider additional treatment options if symptoms persist.  Strict return precautions given to which she expressed understanding.  UA was obtained that showed no evidence of infection.  Given frequency with associated flank pain will send this off for culture but defer antibiotics until results are obtained.  She was instructed to push fluids.  Discussed alarm symptoms that warrant emergent evaluation including fever, nausea, vomiting.  Discussed alarm symptoms that warrant emergent evaluation.  Strict return precautions given to which she expressed understanding.  Blood pressure is very elevated today.  Patient attributed this to pain.   She denies any signs/symptoms of endorgan damage.  Recommend she continue taking blood pressure medication as prescribed monitor blood pressure at home.  If this remains elevated she is to be reevaluated.  Discussed alarm symptoms that warrant emergent evaluation including chest pain, shortness of breath, headache, dizziness in the setting of high blood pressure.  Recommended follow-up with PCP first thing next week to ensure normalization of blood pressure.  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  Please use lidocaine patches as prescribed.  You can use Tylenol as well.  Follow-up with your primary care doctor as we discussed.  There was no evidence of an infection on your urine.  We will contact you if your culture shows that you have an infection we will start antibiotic.  Make sure you are drinking plenty of fluid.  If you have any worsening symptoms please return for reevaluation.  Your blood pressure is very elevated today.  Please keep taking your medication as prescribed.  Monitor your blood pressure at home and if this is persistently elevated you need to be reevaluated for medication adjustment.  Avoid decongestants, NSAIDs, caffeine, sodium.  If you develop any chest pain, shortness of breath, headache, dizziness vision changes in the setting of high blood pressure you need to go to the emergency room.     ED Prescriptions     Medication Sig Dispense Auth. Provider   lidocaine (LIDODERM) 5 % Place 1 patch onto the skin daily. Remove & Discard patch within 12 hours or as directed by MD 30 patch Amori Cooperman K, PA-C      PDMP not reviewed this encounter.   Terrilee Croak, PA-C 12/27/20 1704

## 2020-12-28 LAB — URINE CULTURE: Culture: NO GROWTH

## 2021-01-01 ENCOUNTER — Encounter (HOSPITAL_COMMUNITY)
Admission: RE | Admit: 2021-01-01 | Discharge: 2021-01-01 | Disposition: A | Discharge status: Home / Self Care | Payer: Medicare Other | Source: Ambulatory Visit | Attending: Internal Medicine | Admitting: Internal Medicine

## 2021-01-01 VITALS — BP 161/85 | HR 59 | Temp 97.1°F | Resp 18

## 2021-01-01 DIAGNOSIS — N185 Chronic kidney disease, stage 5: Secondary | ICD-10-CM | POA: Diagnosis not present

## 2021-01-01 LAB — POCT HEMOGLOBIN-HEMACUE: Hemoglobin: 9.3 g/dL — ABNORMAL LOW (ref 12.0–15.0)

## 2021-01-01 MED ORDER — EPOETIN ALFA-EPBX 40000 UNIT/ML IJ SOLN
INTRAMUSCULAR | Status: AC
  Administered 2021-01-01: 10:00:00 40000 [IU] via SUBCUTANEOUS
  Filled 2021-01-01: qty 1

## 2021-01-01 MED ORDER — EPOETIN ALFA-EPBX 40000 UNIT/ML IJ SOLN: 40000.0000 [IU] | INTRAMUSCULAR | Status: DC

## 2021-01-15 ENCOUNTER — Other Ambulatory Visit: Payer: Self-pay

## 2021-01-15 ENCOUNTER — Encounter (HOSPITAL_COMMUNITY)
Admission: RE | Admit: 2021-01-15 | Discharge: 2021-01-15 | Disposition: A | Discharge status: Home / Self Care | Payer: Medicare Other | Source: Ambulatory Visit | Attending: Internal Medicine | Admitting: Internal Medicine

## 2021-01-15 VITALS — BP 163/77 | HR 64 | Resp 19

## 2021-01-15 DIAGNOSIS — N185 Chronic kidney disease, stage 5: Secondary | ICD-10-CM | POA: Insufficient documentation

## 2021-01-15 LAB — IRON AND TIBC
Iron: 52 ug/dL (ref 28–170)
Saturation Ratios: 22 % (ref 10.4–31.8)
TIBC: 238 ug/dL — ABNORMAL LOW (ref 250–450)
UIBC: 186 ug/dL

## 2021-01-15 LAB — FERRITIN: Ferritin: 613 ng/mL — ABNORMAL HIGH (ref 11–307)

## 2021-01-15 LAB — POCT HEMOGLOBIN-HEMACUE: Hemoglobin: 9.3 g/dL — ABNORMAL LOW (ref 12.0–15.0)

## 2021-01-15 MED ORDER — EPOETIN ALFA-EPBX 40000 UNIT/ML IJ SOLN
60000.0000 [IU] | INTRAMUSCULAR | Status: DC
  Administered 2021-01-15: 60000 [IU] via SUBCUTANEOUS

## 2021-01-15 MED ORDER — EPOETIN ALFA-EPBX 40000 UNIT/ML IJ SOLN
INTRAMUSCULAR | Status: AC
  Filled 2021-01-15: qty 2

## 2021-01-21 DIAGNOSIS — U071 COVID-19: Secondary | ICD-10-CM

## 2021-01-21 HISTORY — DX: COVID-19: U07.1

## 2021-01-23 ENCOUNTER — Ambulatory Visit (HOSPITAL_COMMUNITY)
Admission: RE | Admit: 2021-01-23 | Discharge: 2021-01-23 | Disposition: A | Discharge status: Home / Self Care | Payer: Medicare Other | Source: Ambulatory Visit | Attending: Family Medicine | Admitting: Family Medicine

## 2021-01-23 ENCOUNTER — Other Ambulatory Visit: Payer: Self-pay

## 2021-01-23 ENCOUNTER — Encounter (HOSPITAL_COMMUNITY): Payer: Self-pay

## 2021-01-23 VITALS — BP 181/73 | HR 62 | Temp 97.9°F | Resp 17

## 2021-01-23 DIAGNOSIS — U071 COVID-19: Secondary | ICD-10-CM | POA: Diagnosis not present

## 2021-01-23 MED ORDER — MOLNUPIRAVIR EUA 200MG CAPSULE: 4.0000 | ORAL_CAPSULE | Freq: Two times a day (BID) | ORAL | 0 refills | Status: AC

## 2021-01-23 NOTE — Discharge Instructions (Signed)
The antiviral I sent in is Molnupiravir, to take 4 capsules twice daily for 5 days.

## 2021-01-23 NOTE — ED Triage Notes (Signed)
Pt presents covid positive as of Sunday 01/21/2021; pt states she has some slight fatigue and a slight cough but overall feels fine.

## 2021-01-23 NOTE — ED Provider Notes (Signed)
[facility]    CSN: [id number] Arrival date & time: 01/23/21  1334      History   Chief Complaint Chief Complaint  Patient presents with   Covid +    HPI Tiffany [name] is a 70 y.o. female.   HPI Here with a h/o cough, congestion and malaise since 12/16 (4 days). She did a home COVID test, and was positive, on 12/18. Shows me the test result on her phone.  No f/c. No n/v/d. A little dyspneic when lies down.  PMH: DM, sugars really good. Cr is 3.81, eGFR 12 when last done.  Past Medical History:  Diagnosis Date   ALLERGIC RHINITIS    Arthritis    CHF (congestive heart failure) (HCC)    Chronic diastolic heart failure ([place]) 05/05/2013   Echo 03/2018:  EF 23-53, +diastolic dysfunction, GLS -20.5%, normal RVSF, RVSP 42.5 (mild elevated), severe LAE, small pericardial eff, mild MAc, mod AV calc, mild PI   CKD (chronic kidney disease)    Diabetes mellitus    Dyspnea    Heart murmur    at birth   Hyperlipidemia    Hypertension    Hypothyroid    Iron deficiency anemia    Obesity    OSA on CPAP    Previous back surgery    x 2   Refusal of blood transfusions as patient is Jehovah's Witness    S/P arthroscopy     Patient Active Problem List   Diagnosis Date Noted   Volume overload 09/30/2020   Symptomatic anemia 09/29/2020   ESRD (end stage renal disease) ([place]) 06/04/2020   Acute on chronic diastolic (congestive) heart failure ([place]) 04/26/2020   Type 2 diabetes mellitus with stage 5 chronic kidney disease ([place]) 04/26/2020   Acute CHF (congestive heart failure) ([place]) 02/01/2020   S/P left TKA 07/01/2019   Status post total left knee replacement 07/01/2019   Hyperlipidemia 09/12/2016   Type 2 diabetes mellitus without complications ([place]) 61/44/3154   Tachycardia 08/26/2016   Palpitation 08/26/2016   SOB (shortness of breath) 08/26/2016   Chronic kidney disease, stage V ([place]) 04/28/2014   Chronic diastolic heart failure ([place]) 05/05/2013   Essential  hypertension 05/05/2013   HEART MURMUR, SYSTOLIC 00/86/7619   Hypothyroidism 02/12/2007   OBESITY 01/08/2007   Obstructive sleep apnea 01/08/2007   Seasonal and perennial allergic rhinitis 01/08/2007    Past Surgical History:  Procedure Laterality Date   BREAST BIOPSY  20+ yrs ago   dont know which breast per pt; benign   BREAST REDUCTION SURGERY  1982   COLONOSCOPY WITH PROPOFOL N/A 10/24/2020   Procedure: COLONOSCOPY WITH PROPOFOL;  Surgeon: Ronnette Juniper, MD;  Location: [facility];  Service: Gastroenterology;  Laterality: N/A;   KNEE SURGERY     x 2   LUMBAR DISC SURGERY     x 2   POLYPECTOMY  10/24/2020   Procedure: POLYPECTOMY;  Surgeon: Ronnette Juniper, MD;  Location: [facility];  Service: Gastroenterology;;   REDUCTION MAMMAPLASTY Bilateral 1985   [place]   partial hysterectomy- still has ovaries   TOTAL KNEE ARTHROPLASTY Left 07/01/2019   Procedure: TOTAL KNEE ARTHROPLASTY;  Surgeon: Paralee Cancel, MD;  Location: [facility];  Service: Orthopedics;  Laterality: Left;  70 min NO BLOOD PRODUCTS!!   TREATMENT FISTULA ANAL      OB History   No obstetric history on file.      Home Medications  Prior to Admission medications   Medication Sig Start Date End Date Taking? Authorizing Provider  molnupiravir EUA (LAGEVRIO) 200 mg CAPS capsule Take 4 capsules (800 mg total) by mouth 2 (two) times daily for 5 days. 01/23/21 01/28/21 Yes Valentino Saavedra, Gwenlyn Perking, MD  albuterol (VENTOLIN HFA) 108 (90 Base) MCG/ACT inhaler Inhale 2 puffs into the lungs every 6 (six) hours as needed for wheezing or shortness of breath.    [provider]  amLODipine (NORVASC) 10 MG tablet Take 10 mg by mouth 2 (two) times daily.    [provider]  aspirin EC 81 MG tablet Take 81 mg by mouth every evening. Swallow whole.    [provider]  B Complex-C (B-COMPLEX WITH VITAMIN C) tablet Take 1 tablet by mouth in the morning.    [provider]  budesonide-formoterol [facility]) 160-4.5 MCG/ACT inhaler Inhale 2 puffs then rinse mouth, twice daily 09/25/20   Baird Lyons D, MD  calcitRIOL (ROCALTROL) 0.5 MCG capsule Take 0.5 mcg by mouth in the morning. 08/03/20   [provider]  carvedilol (COREG) 12.5 MG tablet Take 12.5 mg by mouth 2 (two) times daily. 09/15/20   [provider]  cetirizine (ZYRTEC) 10 MG tablet Take 10 mg by mouth daily as needed for allergies.    [provider]  cloNIDine (CATAPRES - DOSED IN MG/24 HR) 0.3 mg/24hr patch Place 0.3 mg onto the skin every Friday.    [provider]  hydrALAZINE (APRESOLINE) 50 MG tablet Take 50 mg by mouth in the morning. 08/10/20   [provider]  LANTUS SOLOSTAR 100 UNIT/ML Solostar Pen Inject 15 Units into the skin at bedtime. 08/08/20   [provider]  levothyroxine (SYNTHROID) 112 MCG tablet Take 224 mcg by mouth daily before breakfast.    [provider]  lidocaine (LIDODERM) 5 % Place 1 patch onto the skin daily. Remove & Discard patch within 12 hours or as directed by MD 12/27/20   Raspet, Derry Skill, PA-C  Multiple Vitamins-Minerals (PRESERVISION AREDS 2 PO) Take 1 tablet by mouth in the morning.    [provider]  Omega-3 Fatty Acids (FISH OIL) 500 MG CAPS Take 1 capsule by mouth at bedtime.    [provider]  Propylene Glycol (SYSTANE COMPLETE) 0.6 % SOLN Place 1 drop into both eyes in the morning, at noon, in the evening, and at bedtime.    [provider]  torsemide (DEMADEX) 100 MG tablet Take 100-150 mg by mouth See admin instructions. Take 1 tablet (100 mg) by mouth every other day alternating with taking 1.5 tablets (150 mg) by mouth every other day.    [provider]  valACYclovir (VALTREX) 500 MG tablet Take 1 tablet (500 mg total) by mouth daily. 11/20/20   Raspet, Derry Skill, PA-C  zolpidem (AMBIEN) 10 MG tablet Take 10 mg by mouth at bedtime.    [provider]    Family History Family History  Problem Relation Age of Onset   Prostate cancer Father    Congestive Heart Failure Mother    Diabetes Sister    Other Sister        septis   Breast cancer Neg Hx     Social History Social History   Tobacco Use   Smoking status: Never   Smokeless tobacco: Never  Vaping Use   Vaping Use: Never used  Substance Use Topics   Alcohol use: Yes    Alcohol/week: 0.0 standard drinks  Comment: rare   Drug use: No     Allergies   Sulfa antibiotics   Review of Systems Review of Systems   Physical Exam Triage Vital Signs ED Triage Vitals  Enc Vitals Group     BP 01/23/21 1421 (!) 181/73     Pulse Rate 01/23/21 1419 62     Resp 01/23/21 1419 17     Temp 01/23/21 1419 97.9 F (36.6 C)     Temp Source 01/23/21 1419 Oral     SpO2 01/23/21 1419 100 %     Weight --      Height --      Head Circumference --      Peak Flow --      Pain Score 01/23/21 1418 0     Pain Loc --      Pain Edu? --      Excl. in [place]? --    No data found.  Updated Vital Signs BP (!) 181/73    Pulse 62    Temp 97.9 F (36.6 C) (Oral)    Resp 17    SpO2 100%   Visual Acuity Right Eye Distance:   Left Eye Distance:   Bilateral Distance:    Right Eye Near:   Left Eye Near:    Bilateral Near:     Physical Exam Vitals reviewed.  Constitutional:      General: She is not in acute distress. HENT:     Head: Atraumatic.     Nose: Congestion present.     Mouth/Throat:     Mouth: Mucous membranes are moist.     Pharynx: No oropharyngeal exudate or posterior oropharyngeal erythema.  Eyes:     Extraocular Movements: Extraocular movements intact.     Pupils: Pupils are equal, round, and reactive to light.  Cardiovascular:     Rate and Rhythm: Normal rate and regular rhythm.     Heart sounds: No murmur heard. Pulmonary:     Effort: Pulmonary effort is normal.     Breath sounds: No stridor. No wheezing or rhonchi.  Musculoskeletal:      Cervical back: Neck supple.  Lymphadenopathy:     Cervical: No cervical adenopathy.  Skin:    Coloration: Skin is not jaundiced or pale.  Neurological:     General: No focal deficit present.     Mental Status: She is alert and oriented to person, place, and time.  Psychiatric:        Behavior: Behavior normal.     UC Treatments / Results  Labs (all labs ordered are listed, but only abnormal results are displayed) Labs Reviewed - No data to display  EKG   Radiology No results found.  Procedures Procedures (including critical care time)  Medications Ordered in UC Medications - No data to display  Initial Impression / Assessment and Plan / UC Course  I have reviewed the triage vital signs and the nursing notes.  Pertinent labs & imaging results that were available during my care of the patient were reviewed by me and considered in my medical decision making (see chart for details).     Discussed reasons we can't do paxlovid for her. Molnupiravir sent instead. To return if worsening. Final Clinical Impressions(s) / UC Diagnoses   Final diagnoses:  COVID     Discharge Instructions      The antiviral I sent in is Molnupiravir, to take 4 capsules twice daily for 5 days.     ED Prescriptions  Medication Sig Dispense Auth. Provider   molnupiravir EUA (LAGEVRIO) 200 mg CAPS capsule Take 4 capsules (800 mg total) by mouth 2 (two) times daily for 5 days. 40 capsule Windy Carina Gwenlyn Perking, MD      PDMP not reviewed this encounter.   Barrett Henle, MD 01/23/21 1435

## 2021-01-26 ENCOUNTER — Other Ambulatory Visit (HOSPITAL_COMMUNITY): Payer: Self-pay | Admitting: *Deleted

## 2021-01-30 ENCOUNTER — Encounter (HOSPITAL_COMMUNITY)
Admission: RE | Admit: 2021-01-30 | Discharge: 2021-01-30 | Disposition: A | Discharge status: Home / Self Care | Payer: Medicare Other | Source: Ambulatory Visit | Attending: Internal Medicine | Admitting: Internal Medicine

## 2021-01-30 VITALS — BP 163/77 | HR 64 | Temp 97.7°F | Resp 18

## 2021-01-30 DIAGNOSIS — N185 Chronic kidney disease, stage 5: Secondary | ICD-10-CM

## 2021-01-30 LAB — RENAL FUNCTION PANEL
Albumin: 3.5 g/dL (ref 3.5–5.0)
Anion gap: 11 (ref 5–15)
BUN: 88 mg/dL — ABNORMAL HIGH (ref 8–23)
CO2: 17 mmol/L — ABNORMAL LOW (ref 22–32)
Calcium: 9.3 mg/dL (ref 8.9–10.3)
Chloride: 109 mmol/L (ref 98–111)
Creatinine, Ser: 4.26 mg/dL — ABNORMAL HIGH (ref 0.44–1.00)
GFR, Estimated: 11 mL/min — ABNORMAL LOW (ref >60)
Glucose, Bld: 134 mg/dL — ABNORMAL HIGH (ref 70–99)
Phosphorus: 5.1 mg/dL — ABNORMAL HIGH (ref 2.5–4.6)
Potassium: 4.6 mmol/L (ref 3.5–5.1)
Sodium: 137 mmol/L (ref 135–145)

## 2021-01-30 LAB — POCT HEMOGLOBIN-HEMACUE: Hemoglobin: 9.5 g/dL — ABNORMAL LOW (ref 12.0–15.0)

## 2021-01-30 MED ORDER — EPOETIN ALFA-EPBX 40000 UNIT/ML IJ SOLN
INTRAMUSCULAR | Status: AC
  Administered 2021-01-30: 10:00:00 60000 [IU] via SUBCUTANEOUS
  Filled 2021-01-30: qty 2

## 2021-01-30 MED ORDER — EPOETIN ALFA-EPBX 40000 UNIT/ML IJ SOLN: 60000.0000 [IU] | INTRAMUSCULAR | Status: DC

## 2021-01-31 LAB — PTH, INTACT AND CALCIUM
Calcium, Total (PTH): 9.3 mg/dL (ref 8.7–10.3)
PTH: 198 pg/mL — ABNORMAL HIGH (ref 15–65)

## 2021-02-02 ENCOUNTER — Encounter (HOSPITAL_BASED_OUTPATIENT_CLINIC_OR_DEPARTMENT_OTHER): Payer: Self-pay | Admitting: Orthopedic Surgery

## 2021-02-02 ENCOUNTER — Other Ambulatory Visit: Payer: Self-pay

## 2021-02-02 NOTE — Progress Notes (Addendum)
Spoke w/ via phone for pre-op interview---pt Lab needs dos---- I stat              Lab results------see below COVID test -----covid positive home test 01-21-2021 Arrive at -------800 am 02-09-2020 NPO after MN NO Solid Food.  Clear liquids from MN until---700 am Med rec completed Medications to take morning of surgery -----symbicort, amlodipine, albuterol inhaler prn/bring inhaler, hydrazaline, levothyroxine, clonidine patch, systane eye drop Diabetic medication -----take 1/2 dose lantus insulin night before surgery on 02-07-2021 Patient instructed no nail polish to be worn day of surgery Patient instructed to bring photo id and insurance card day of surgery Patient aware to have Driver (ride ) / caregiver    for 24 hours after surgery  friend susan scales driver and caregiver Patient Special Instructions -----bring cpap mask tubing and machine and leave in car Pre-Op special Istructions -----orders req dr Greta Doom epic ib Patient verbalized understanding of instructions that were given at this phone interview. Patient denies shortness of breath, chest pain, fever, cough at this phone interview.   ADDENDUM: REVIEWED PT HISTORY AND POSITIVE HOME COVID TEST 01-21-2021  , PT WITH STILL OCCASIONAL CONGESTION, PT CAN LIE FLAT WITH DR F HATCHETT MDA, PT OK FOR [facility] SURGERY ON 02-08-2021 PER DR F HATCHETT MDA.   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

## 2021-02-06 NOTE — H&P (Signed)
Preoperative History & Physical Exam  Surgeon: Matt Holmes, MD  Diagnosis: Left Ring Trigger Finger, left ring volar ganglion cyst  Planned Procedure: Procedure(s) (LRB): Left Ring trigger finger release, left ring finger volar ganglion cyst excision (Left)  History of Present Illness:   Patient is a 71 y.o. female with symptoms consistent with  Left Ring Trigger Finger, left ring volar ganglion cyst who presents for surgical intervention. The risks, benefits and alternatives of surgical intervention were discussed and informed consent was obtained prior to surgery.  Past Medical History:  Past Medical History:  Diagnosis Date   ALLERGIC RHINITIS    Arthritis    CHF (congestive heart failure) (HCC)    CKD (chronic kidney disease)    COVID 01/21/2021   + HOME TEST  HAD COUGH CONGESTION AND MALAISE, OCC CONGESTION NOW   Heart murmur    at birth   Hyperlipidemia    Hypertension    Hypothyroid    Iron deficiency anemia    Obesity    OSA on CPAP    Refusal of blood transfusions as patient is Jehovah's Witness    Shingles 11/2020   LEFT BACK   Type 2 dm with insulin use    checks abg qday runs 125   Wears glasses     Past Surgical History:  Past Surgical History:  Procedure Laterality Date   BREAST BIOPSY     left breast per pt; benign 20+ yrs ago   [place]   COLONOSCOPY WITH PROPOFOL N/A 10/24/2020   Procedure: COLONOSCOPY WITH PROPOFOL;  Surgeon: Ronnette Juniper, MD;  Location: [facility];  Service: Gastroenterology;  Laterality: N/A;   KNEE SURGERY Bilateral    x 2   LUMBAR DISC SURGERY     x 2   POLYPECTOMY  10/24/2020   Procedure: POLYPECTOMY;  Surgeon: Ronnette Juniper, MD;  Location: [facility];  Service: Gastroenterology;;   REDUCTION MAMMAPLASTY Bilateral 1985   TOE SURGERY     yrs ago per pt on 02-02-2021   TOTAL ABDOMINAL HYSTERECTOMY  1997   partial hysterectomy- still has ovaries   TOTAL KNEE ARTHROPLASTY Left 07/01/2019    Procedure: TOTAL KNEE ARTHROPLASTY;  Surgeon: Paralee Cancel, MD;  Location: [facility];  Service: Orthopedics;  Laterality: Left;  70 min NO BLOOD PRODUCTS!!   TREATMENT FISTULA ANAL     yrs ago per pt 0n 02-02-2021    Medications:  Prior to Admission medications   Medication Sig Start Date End Date Taking? Authorizing Provider  nirmatrelvir/ritonavir EUA, renal dosing, (PAXLOVID) 10 x 150 MG & 10 x 100MG  TABS Take 4 tablets by mouth 2 (two) times daily. Take nirmatrelvir (150 mg) one tablet twice daily for 5 days and ritonavir (100 mg) one tablet twice daily for 5 days.   Yes [provider]  albuterol (VENTOLIN HFA) 108 (90 Base) MCG/ACT inhaler Inhale 2 puffs into the lungs every 6 (six) hours as needed for wheezing or shortness of breath.    [provider]  amLODipine (NORVASC) 10 MG tablet Take 10 mg by mouth 2 (two) times daily.    [provider]  aspirin EC 81 MG tablet Take 81 mg by mouth every evening. Swallow whole.    [provider]  B Complex-C (B-COMPLEX WITH VITAMIN C) tablet Take 1 tablet by mouth in the morning.    [provider]  budesonide-formoterol [facility]) 160-4.5 MCG/ACT inhaler Inhale 2 puffs then rinse mouth, twice daily 09/25/20   Baird Lyons  D, MD  calcitRIOL (ROCALTROL) 0.5 MCG capsule Take 0.5 mcg by mouth in the morning. 08/03/20   [provider]  carvedilol (COREG) 12.5 MG tablet Take 12.5 mg by mouth 2 (two) times daily. 09/15/20   [provider]  cetirizine (ZYRTEC) 10 MG tablet Take 10 mg by mouth daily as needed for allergies.    [provider]  cloNIDine (CATAPRES - DOSED IN MG/24 HR) 0.3 mg/24hr patch Place 0.3 mg onto the skin every Friday.    [provider]  hydrALAZINE (APRESOLINE) 50 MG tablet Take 50 mg by mouth 2 (two) times daily. 08/10/20   [provider]  LANTUS SOLOSTAR 100 UNIT/ML Solostar Pen Inject 15 Units into the skin at bedtime. 08/08/20   [provider]  levothyroxine (SYNTHROID) 112 MCG tablet Take 224 mcg by mouth daily before breakfast.    [provider]  Multiple Vitamins-Minerals (PRESERVISION AREDS 2 PO) Take 1 tablet by mouth in the morning.    [provider]  Omega-3 Fatty Acids (FISH OIL) 500 MG CAPS Take 1 capsule by mouth at bedtime.    [provider]  Propylene Glycol (SYSTANE COMPLETE) 0.6 % SOLN Place 1 drop into both eyes in the morning, at noon, in the evening, and at bedtime.    [provider]  torsemide (DEMADEX) 100 MG tablet Take 100-150 mg by mouth See admin instructions. Take 1 tablet (100 mg) by mouth every other day alternating with taking 1.5 tablets (150 mg) by mouth every other day.    [provider]  zolpidem (AMBIEN) 10 MG tablet Take 10 mg by mouth at bedtime.    [provider]    Allergies:  Other and Sulfa antibiotics  Review of Systems: Negative except per HPI.  Physical Exam: Alert and oriented, NAD Head and neck: no masses, normal alignment CV: pulse intact Pulm: no increased work of breathing, respirations even and unlabored Abdomen: non-distended Extremities: extremities warm and well perfused  LABS: Recent Results (from the past 2160 hour(s))  Iron and TIBC     Status: None   Collection Time: 11/14/20  9:52 AM  Result Value Ref Range   Iron 53 28 - 170 ug/dL   TIBC 270 250 - 450 ug/dL   Saturation Ratios 20 10.4 - 31.8 %   UIBC 217 ug/dL    Comment: Performed at [facility] [address], [place], [place] [address]  Ferritin     Status: Abnormal   Collection Time: 11/14/20  9:52 AM  Result Value Ref Range   Ferritin 455 (H) 11 - 307 ng/mL    Comment: Performed at [facility], [place] [address] [place], [place] [address]  Hemoglobin-hemacue, POC     Status: Abnormal   Collection Time: 11/14/20  9:54 AM  Result Value Ref Range   Hemoglobin 10.7 (L) 12.0 - 15.0 g/dL  Iron and TIBC     Status: Abnormal    Collection Time: 12/18/20  9:42 AM  Result Value Ref Range   Iron 67 28 - 170 ug/dL   TIBC 244 (L) 250 - 450 ug/dL   Saturation Ratios 28 10.4 - 31.8 %   UIBC 177 ug/dL    Comment: Performed at [facility], [place] [address], [place], [place] [address]  Ferritin     Status: Abnormal   Collection Time: 12/18/20  9:42 AM  Result Value Ref Range   Ferritin 605 (H) 11 - 307 ng/mL  Comment: Performed at [facility], [place] [address] [place], [place] [address]  Renal function panel     Status: Abnormal   Collection Time: 12/18/20  9:42 AM  Result Value Ref Range   Sodium 142 135 - 145 mmol/L   Potassium 4.3 3.5 - 5.1 mmol/L   Chloride 108 98 - 111 mmol/L   CO2 21 (L) 22 - 32 mmol/L   Glucose, Bld 136 (H) 70 - 99 mg/dL    Comment: Glucose reference range applies only to samples taken after fasting for at least 8 hours.   BUN 95 (H) 8 - 23 mg/dL   Creatinine, Ser 3.81 (H) 0.44 - 1.00 mg/dL   Calcium 9.5 8.9 - 10.3 mg/dL   Phosphorus 4.8 (H) 2.5 - 4.6 mg/dL   Albumin 3.4 (L) 3.5 - 5.0 g/dL   GFR, Estimated 12 (L) >60 mL/min    Comment: (NOTE) Calculated using the CKD-EPI Creatinine Equation (2021)    Anion gap 13 5 - 15    Comment: Performed at [facility] [address], [place], [place] [address]  PTH, intact and calcium     Status: Abnormal   Collection Time: 12/18/20  9:42 AM  Result Value Ref Range   PTH 260 (H) 15 - 65 pg/mL   Calcium, Total (PTH) 9.3 8.7 - 10.3 mg/dL   PTH Interp Comment     Comment: (NOTE) Interpretation                 Intact PTH    Calcium                                (pg/mL)      (mg/dL) Normal                          15 - 65     8.6 - 10.2 Primary Hyperparathyroidism         >65          >10.2 Secondary Hyperparathyroidism       >65          <10.2 Non-Parathyroid Hypercalcemia       <65          >10.2 Hypoparathyroidism                  <15          < 8.6 Non-Parathyroid Hypocalcemia    15 - 65          < 8.6 Performed At: [facility] [place], [place] [id number] Rush Farmer MD NU:[id number]   Hemoglobin-hemacue, POC     Status: Abnormal   Collection Time: 12/18/20  9:50 AM  Result Value Ref Range   Hemoglobin 9.5 (L) 12.0 - 15.0 g/dL  POC Urinalysis dipstick     Status: Abnormal   Collection Time: 12/27/20  4:10 PM  Result Value Ref Range   Glucose, UA NEGATIVE NEGATIVE mg/dL   Bilirubin Urine NEGATIVE NEGATIVE   Ketones, ur NEGATIVE NEGATIVE mg/dL   Specific Gravity, Urine 1.020 1.005 - 1.030   Hgb urine dipstick NEGATIVE NEGATIVE   pH 7.0 5.0 - 8.0   Protein, ur >=300 (A) NEGATIVE mg/dL   Urobilinogen, UA 0.2 0.0 - 1.0 mg/dL   Nitrite NEGATIVE NEGATIVE   Leukocytes,Ua NEGATIVE NEGATIVE    Comment: Biochemical Testing Only. Please order routine  urinalysis from main lab if confirmatory testing is needed.  Urine Culture     Status: None   Collection Time: 12/27/20  4:21 PM   Specimen: Urine, Clean Catch  Result Value Ref Range   Specimen Description URINE, CLEAN CATCH    Special Requests NONE    Culture      NO GROWTH Performed at [facility], [place] [address] [place], [place] [address]    Report Status 12/28/2020 FINAL   Hemoglobin-hemacue, POC     Status: Abnormal   Collection Time: 01/01/21  9:40 AM  Result Value Ref Range   Hemoglobin 9.3 (L) 12.0 - 15.0 g/dL  Iron and TIBC     Status: Abnormal   Collection Time: 01/15/21  9:39 AM  Result Value Ref Range   Iron 52 28 - 170 ug/dL   TIBC 238 (L) 250 - 450 ug/dL   Saturation Ratios 22 10.4 - 31.8 %   UIBC 186 ug/dL    Comment: Performed at [facility], [place] [address], [place], [place] [address]  Ferritin     Status: Abnormal   Collection Time: 01/15/21  9:39 AM  Result Value Ref Range   Ferritin 613 (H) 11 - 307 ng/mL    Comment: Performed at [facility], [place] [address] [place], [place] [address]  Hemoglobin-hemacue, POC     Status: Abnormal   Collection Time: 01/15/21  9:42 AM  Result  Value Ref Range   Hemoglobin 9.3 (L) 12.0 - 15.0 g/dL  Hemoglobin-hemacue, POC     Status: Abnormal   Collection Time: 01/30/21  9:50 AM  Result Value Ref Range   Hemoglobin 9.5 (L) 12.0 - 15.0 g/dL  Renal function panel     Status: Abnormal   Collection Time: 01/30/21  9:54 AM  Result Value Ref Range   Sodium 137 135 - 145 mmol/L   Potassium 4.6 3.5 - 5.1 mmol/L   Chloride 109 98 - 111 mmol/L   CO2 17 (L) 22 - 32 mmol/L   Glucose, Bld 134 (H) 70 - 99 mg/dL    Comment: Glucose reference range applies only to samples taken after fasting for at least 8 hours.   BUN 88 (H) 8 - 23 mg/dL   Creatinine, Ser 4.26 (H) 0.44 - 1.00 mg/dL   Calcium 9.3 8.9 - 10.3 mg/dL   Phosphorus 5.1 (H) 2.5 - 4.6 mg/dL   Albumin 3.5 3.5 - 5.0 g/dL   GFR, Estimated 11 (L) >60 mL/min    Comment: (NOTE) Calculated using the CKD-EPI Creatinine Equation (2021)    Anion gap 11 5 - 15    Comment: Performed at [facility] [address] [place],  [address]  PTH, intact and calcium     Status: Abnormal   Collection Time: 01/30/21  9:54 AM  Result Value Ref Range   PTH 198 (H) 15 - 65 pg/mL   Calcium, Total (PTH) 9.3 8.7 - 10.3 mg/dL   PTH Interp Comment     Comment: (NOTE) Interpretation                 Intact PTH    Calcium                                (pg/mL)      (mg/dL) Normal  15 - 65     8.6 - 10.2 Primary Hyperparathyroidism         >65          >10.2 Secondary Hyperparathyroidism       >65          <10.2 Non-Parathyroid Hypercalcemia       <65          >10.2 Hypoparathyroidism                  <15          < 8.6 Non-Parathyroid Hypocalcemia    15 - 65          < 8.6 Performed At: [facility] [place], [place] [id number] Rush Farmer MD IS:[id number]      Complete History and Physical exam available in the office notes  Orene Desanctis

## 2021-02-08 ENCOUNTER — Other Ambulatory Visit: Payer: Self-pay

## 2021-02-08 ENCOUNTER — Encounter (HOSPITAL_BASED_OUTPATIENT_CLINIC_OR_DEPARTMENT_OTHER)
Admission: RE | Disposition: A | Discharge status: Home / Self Care | Payer: Self-pay | Source: Home / Self Care | Attending: Orthopedic Surgery

## 2021-02-08 ENCOUNTER — Encounter (HOSPITAL_BASED_OUTPATIENT_CLINIC_OR_DEPARTMENT_OTHER): Payer: Self-pay | Admitting: Orthopedic Surgery

## 2021-02-08 ENCOUNTER — Ambulatory Visit (HOSPITAL_BASED_OUTPATIENT_CLINIC_OR_DEPARTMENT_OTHER): Payer: Medicare Other | Admitting: Anesthesiology

## 2021-02-08 ENCOUNTER — Ambulatory Visit (HOSPITAL_BASED_OUTPATIENT_CLINIC_OR_DEPARTMENT_OTHER)
Admission: RE | Admit: 2021-02-08 | Discharge: 2021-02-08 | Disposition: A | Discharge status: Home / Self Care | Payer: Medicare Other | Attending: Orthopedic Surgery | Admitting: Orthopedic Surgery

## 2021-02-08 DIAGNOSIS — I130 Hypertensive heart and chronic kidney disease with heart failure and stage 1 through stage 4 chronic kidney disease, or unspecified chronic kidney disease: Secondary | ICD-10-CM | POA: Insufficient documentation

## 2021-02-08 DIAGNOSIS — D631 Anemia in chronic kidney disease: Secondary | ICD-10-CM | POA: Insufficient documentation

## 2021-02-08 DIAGNOSIS — E1122 Type 2 diabetes mellitus with diabetic chronic kidney disease: Secondary | ICD-10-CM | POA: Diagnosis not present

## 2021-02-08 DIAGNOSIS — I050 Rheumatic mitral stenosis: Secondary | ICD-10-CM | POA: Insufficient documentation

## 2021-02-08 DIAGNOSIS — M67442 Ganglion, left hand: Secondary | ICD-10-CM | POA: Insufficient documentation

## 2021-02-08 DIAGNOSIS — I509 Heart failure, unspecified: Secondary | ICD-10-CM | POA: Insufficient documentation

## 2021-02-08 DIAGNOSIS — M65342 Trigger finger, left ring finger: Secondary | ICD-10-CM | POA: Insufficient documentation

## 2021-02-08 DIAGNOSIS — Z01818 Encounter for other preprocedural examination: Secondary | ICD-10-CM

## 2021-02-08 DIAGNOSIS — Z794 Long term (current) use of insulin: Secondary | ICD-10-CM | POA: Insufficient documentation

## 2021-02-08 DIAGNOSIS — N189 Chronic kidney disease, unspecified: Secondary | ICD-10-CM | POA: Diagnosis not present

## 2021-02-08 HISTORY — PX: TRIGGER FINGER RELEASE: SHX641

## 2021-02-08 HISTORY — DX: Presence of spectacles and contact lenses: Z97.3

## 2021-02-08 LAB — GLUCOSE, CAPILLARY: Glucose-Capillary: 145 mg/dL — ABNORMAL HIGH (ref 70–99)

## 2021-02-08 LAB — POCT I-STAT, CHEM 8
BUN: 90 mg/dL — ABNORMAL HIGH (ref 8–23)
Calcium, Ion: 1.24 mmol/L (ref 1.15–1.40)
Chloride: 110 mmol/L (ref 98–111)
Creatinine, Ser: 4.8 mg/dL — ABNORMAL HIGH (ref 0.44–1.00)
Glucose, Bld: 126 mg/dL — ABNORMAL HIGH (ref 70–99)
HCT: 35 % — ABNORMAL LOW (ref 36.0–46.0)
Hemoglobin: 11.9 g/dL — ABNORMAL LOW (ref 12.0–15.0)
Potassium: 4.5 mmol/L (ref 3.5–5.1)
Sodium: 141 mmol/L (ref 135–145)
TCO2: 20 mmol/L — ABNORMAL LOW (ref 22–32)

## 2021-02-08 LAB — NO BLOOD PRODUCTS

## 2021-02-08 SURGERY — RELEASE, A1 PULLEY, FOR TRIGGER FINGER
Anesthesia: Monitor Anesthesia Care | Site: Finger | Laterality: Left

## 2021-02-08 MED ORDER — ACETAMINOPHEN 500 MG PO TABS
1000.0000 mg | ORAL_TABLET | Freq: Once | ORAL | Status: AC
  Administered 2021-02-08: 1000 mg via ORAL

## 2021-02-08 MED ORDER — PROPOFOL 10 MG/ML IV BOLUS
INTRAVENOUS | Status: AC
  Filled 2021-02-08: qty 20

## 2021-02-08 MED ORDER — MIDAZOLAM HCL 2 MG/2ML IJ SOLN
INTRAMUSCULAR | Status: AC
  Filled 2021-02-08: qty 2

## 2021-02-08 MED ORDER — BUPIVACAINE HCL 0.5 % IJ SOLN
INTRAMUSCULAR | Status: DC | PRN
  Administered 2021-02-08: 5 mL

## 2021-02-08 MED ORDER — HYDRALAZINE HCL 20 MG/ML IJ SOLN
10.0000 mg | Freq: Once | INTRAMUSCULAR | Status: AC
  Administered 2021-02-08: 10 mg via INTRAVENOUS

## 2021-02-08 MED ORDER — CEFAZOLIN SODIUM-DEXTROSE 2-4 GM/100ML-% IV SOLN
2.0000 g | INTRAVENOUS | Status: AC
  Administered 2021-02-08: 2 g via INTRAVENOUS

## 2021-02-08 MED ORDER — ONDANSETRON HCL 4 MG/2ML IJ SOLN
INTRAMUSCULAR | Status: DC | PRN
Start: 2021-02-08 — End: 2021-02-08
  Administered 2021-02-08: 4 mg via INTRAVENOUS

## 2021-02-08 MED ORDER — PROPOFOL 10 MG/ML IV BOLUS
INTRAVENOUS | Status: DC | PRN
  Administered 2021-02-08: 20 mg via INTRAVENOUS
  Administered 2021-02-08: 10 mg via INTRAVENOUS
  Administered 2021-02-08: 20 mg via INTRAVENOUS

## 2021-02-08 MED ORDER — LIDOCAINE HCL (PF) 1 % IJ SOLN
INTRAMUSCULAR | Status: DC | PRN
Start: 2021-02-08 — End: 2021-02-08
  Administered 2021-02-08: 5 mL

## 2021-02-08 MED ORDER — HYDRALAZINE HCL 20 MG/ML IJ SOLN
INTRAMUSCULAR | Status: AC
  Filled 2021-02-08: qty 1

## 2021-02-08 MED ORDER — ACETAMINOPHEN 500 MG PO TABS
ORAL_TABLET | ORAL | Status: AC
  Filled 2021-02-08: qty 2

## 2021-02-08 MED ORDER — HYDROCODONE-ACETAMINOPHEN 5-325 MG PO TABS: 1.0000 | ORAL_TABLET | Freq: Four times a day (QID) | ORAL | 0 refills | Status: AC | PRN

## 2021-02-08 MED ORDER — ONDANSETRON HCL 4 MG/2ML IJ SOLN
INTRAMUSCULAR | Status: AC
  Filled 2021-02-08: qty 2

## 2021-02-08 MED ORDER — CEFAZOLIN SODIUM-DEXTROSE 2-4 GM/100ML-% IV SOLN
INTRAVENOUS | Status: AC
  Filled 2021-02-08: qty 100

## 2021-02-08 MED ORDER — SODIUM CHLORIDE 0.9 % IV SOLN: INTRAVENOUS | Status: DC

## 2021-02-08 MED ORDER — FENTANYL CITRATE (PF) 100 MCG/2ML IJ SOLN
INTRAMUSCULAR | Status: AC
  Filled 2021-02-08: qty 2

## 2021-02-08 MED ORDER — MIDAZOLAM HCL 5 MG/5ML IJ SOLN
INTRAMUSCULAR | Status: DC | PRN
  Administered 2021-02-08 (×2): 1 mg via INTRAVENOUS

## 2021-02-08 MED ORDER — LIDOCAINE 2% (20 MG/ML) 5 ML SYRINGE
INTRAMUSCULAR | Status: DC | PRN
  Administered 2021-02-08: 60 mg via INTRAVENOUS

## 2021-02-08 SURGICAL SUPPLY — 30 items
BLADE SURG 15 STRL LF DISP TIS (BLADE) ×1 IMPLANT
BLADE SURG 15 STRL SS (BLADE) ×2
BNDG CMPR 9X4 STRL LF SNTH (GAUZE/BANDAGES/DRESSINGS) ×1
BNDG ELASTIC 4X5.8 VLCR STR LF (GAUZE/BANDAGES/DRESSINGS) ×2 IMPLANT
BNDG ESMARK 4X9 LF (GAUZE/BANDAGES/DRESSINGS) ×2 IMPLANT
COVER BACK TABLE 60X90IN (DRAPES) ×2 IMPLANT
CUFF TOURN SGL QUICK 18X4 (TOURNIQUET CUFF) IMPLANT
CUFF TOURN SGL QUICK 24 (TOURNIQUET CUFF)
CUFF TRNQT CYL 24X4X16.5-23 (TOURNIQUET CUFF) IMPLANT
DRAPE EXTREMITY T 121X128X90 (DISPOSABLE) ×2 IMPLANT
DRAPE SHEET LG 3/4 BI-LAMINATE (DRAPES) ×2 IMPLANT
DRAPE SURG 17X23 STRL (DRAPES) ×2 IMPLANT
GAUZE 4X4 16PLY ~~LOC~~+RFID DBL (SPONGE) ×2 IMPLANT
GAUZE SPONGE 4X4 12PLY STRL (GAUZE/BANDAGES/DRESSINGS) ×2 IMPLANT
GAUZE XEROFORM 1X8 LF (GAUZE/BANDAGES/DRESSINGS) ×2 IMPLANT
GOWN STRL REUS W/ TWL LRG LVL3 (GOWN DISPOSABLE) ×1 IMPLANT
GOWN STRL REUS W/TWL LRG LVL3 (GOWN DISPOSABLE) ×2
HIBICLENS CHG 4% 4OZ BTL (MISCELLANEOUS) ×2 IMPLANT
NDL HYPO 25X1 1.5 SAFETY (NEEDLE) ×1 IMPLANT
NEEDLE HYPO 25X1 1.5 SAFETY (NEEDLE) ×2 IMPLANT
NS IRRIG 1000ML POUR BTL (IV SOLUTION) ×2 IMPLANT
PACK BASIN DAY SURGERY FS (CUSTOM PROCEDURE TRAY) ×2 IMPLANT
PAD CAST 4YDX4 CTTN HI CHSV (CAST SUPPLIES) ×1 IMPLANT
PADDING CAST COTTON 4X4 STRL (CAST SUPPLIES) ×2
SUT ETHILON 4 0 PS 2 18 (SUTURE) IMPLANT
SYR 10ML LL (SYRINGE) ×2 IMPLANT
SYR BULB EAR ULCER 3OZ GRN STR (SYRINGE) ×2 IMPLANT
TOWEL OR 17X26 10 PK STRL BLUE (TOWEL DISPOSABLE) ×2 IMPLANT
TRAY DSU PREP LF (CUSTOM PROCEDURE TRAY) ×2 IMPLANT
UNDERPAD 30X36 HEAVY ABSORB (UNDERPADS AND DIAPERS) ×2 IMPLANT

## 2021-02-08 NOTE — Interval H&P Note (Signed)
History and Physical Interval Note:  02/08/2021 9:41 AM  Tiffany [name]  has presented today for surgery, with the diagnosis of Left Ring Trigger Finger, left ring volar ganglion cyst.  The various methods of treatment have been discussed with the patient and family. After consideration of risks, benefits and other options for treatment, the patient has consented to  Procedure(s) with comments: Left Ring trigger finger release, left ring finger volar ganglion cyst excision (Left) - with local anesthesia as a surgical intervention.  The patient's history has been reviewed, patient examined, no change in status, stable for surgery.  I have reviewed the patient's chart and labs.  Questions were answered to the patient's satisfaction.     Orene Desanctis

## 2021-02-08 NOTE — Discharge Instructions (Addendum)
Orthopaedic Hand Surgery Discharge Instructions  WEIGHT BEARING STATUS: Non weight bearing on operative extremity  INCISION CARE: Keep dressing over your incision clean and dry until 5 days after surgery. You may shower by placing a waterproof covering over your dressing. Once dressing is removed, you may allow water to run over the incision and then place Band-Aids over incision. Do not scrub your incision or apply creams/lotions. Do not submerge your incision or swim for 3 weeks after surgery. Contact your surgeon or primary care doctor if you develop redness or drainage from your incision.   PAIN CONTROL: First line medications for post operative pain control are Tylenol (acetaminophen) and Motrin (ibuprofen) if you are able to take these medications. If you have been prescribed a medication these can be taken as breakthrough pain medications. Please note that some narcotic pain medication has acetaminophen added and you should never consume more than 4,000mg of acetaminophen in 24-hour period. Please note that if you are given Toradol (ketorolac) you should not take similar medications such as ibuprofen or naproxen.  DISCHARGE MEDICATIONS: If you have been prescribed medication it was sent electronically to your pharmacy. No changes have been made to your home medications.  ICE/ELEVATION: Ice and elevate your injured extremity as needed. Avoid direct contact of ice with skin.   BANDAGE FEELS TOO TIGHT: If your bandage feels too tight, first make sure you are elevating your fingers as much as possible. The outer layer of the bandage can be unwrapped and reapplied more loosely. If no improvement, you may carefully cut the inner layer longitudinally until the pressure has resolved and then rewrap the outer layer. If you are not comfortable with these instructions, please call the office and the bandage can be changed for you.   FOLLOW UP: You will be called after surgery with an appointment date and  time, however if you have not received a phone call within 3 days, please call during regular office hours at [phone] to schedule a post operative appointment.  Please Seek Medical Attention if: Call MD for: pain or pressure in chest, jaw, arm, back, neck  Call MD for: temperature greater than 101 F for more than 24 hrs Call MD for: difficulty breathing Call MD for: incision redness, bleeding, drainage  Call MD for: palpitations or feeling that the heart is racing  Call MD for: increased swelling in arm, leg, ankle, or abdomen  Call MD for: lightheadedness, dizziness, fainting Call 911 or go to ER for any medical emergency if you are not able to get in touch with your doctor   J. Reid Spears, MD Orthopaedic Hand Surgeon [facility] Office number: [phone] [address] [place], [place] [address]   Post Anesthesia Home Care Instructions  Activity: Get plenty of rest for the remainder of the day. A responsible individual must stay with you for 24 hours following the procedure.  For the next 24 hours, DO NOT: -Drive a car -Operate machinery -Drink alcoholic beverages -Take any medication unless instructed by your physician -Make any legal decisions or sign important papers.  Meals: Start with liquid foods such as gelatin or soup. Progress to regular foods as tolerated. Avoid greasy, spicy, heavy foods. If nausea and/or vomiting occur, drink only clear liquids until the nausea and/or vomiting subsides. Call your physician if vomiting continues.  Special Instructions/Symptoms: Your throat may feel dry or sore from the anesthesia or the breathing tube placed in your throat during surgery. If this causes discomfort, gargle with warm   salt water. The discomfort should disappear within 24 hours.  No acetaminophen/Tylenol until after 3:00 pm today if needed.

## 2021-02-08 NOTE — Anesthesia Postprocedure Evaluation (Signed)
Anesthesia Post Note  Patient: Tiffany [name]  Procedure(s) Performed: Left Ring trigger finger release, left ring finger volar ganglion cyst excision (Left: Finger)     Patient location during evaluation: PACU Anesthesia Type: MAC Level of consciousness: awake and alert Pain management: pain level controlled Vital Signs Assessment: post-procedure vital signs reviewed and stable Respiratory status: spontaneous breathing and respiratory function stable Cardiovascular status: stable Postop Assessment: no apparent nausea or vomiting Anesthetic complications: no   No notable events documented.  Last Vitals:  Vitals:   02/08/21 1025 02/08/21 1043  BP: 134/68 (!) 145/70  Pulse: (!) 58 (!) 56  Resp: 18   Temp: (!) 36.4 C   SpO2: 93% 96%    Last Pain:  Vitals:   02/08/21 1025  TempSrc:   PainSc: 0-No pain                 Merlinda Frederick

## 2021-02-08 NOTE — Transfer of Care (Signed)
Immediate Anesthesia Transfer of Care Note  Patient: Tiffany [name]  Procedure(s) Performed: Left Ring trigger finger release, left ring finger volar ganglion cyst excision (Left: Finger)  Patient Location: PACU  Anesthesia Type:MAC  Level of Consciousness: awake, alert  and oriented  Airway & Oxygen Therapy: Patient Spontanous Breathing  Post-op Assessment: Report given to RN and Post -op Vital signs reviewed and stable  Post vital signs: Reviewed and stable  Last Vitals:  Vitals Value Taken Time  BP 134/68 02/08/21 1025  Temp    Pulse 58 02/08/21 1025  Resp    SpO2 92 % 02/08/21 1025  Vitals shown include unvalidated device data.  Last Pain:  Vitals:   02/08/21 0838  TempSrc:   PainSc: 0-No pain      Patients Stated Pain Goal: 5 (50/93/26 7124)  Complications: No notable events documented.

## 2021-02-08 NOTE — Op Note (Signed)
OPERATIVE NOTE  DATE OF SURGERY: 02/08/21   SURGEON:  Matt Holmes, MD  PREOPERATIVE DIAGNOSIS: Left Ring Finger Trigger Digit   POSTOPERATIVE DIAGNOSIS: Same  NAME OF PROCEDURE:   1. Left Ring Finger Release of the A1 Pulley  2. Left ring finger volar retinacular ganglion cyst excision  SKIN PREPARATION: Hibiclens  ESTIMATED BLOOD LOSS: Minimal  DESCRIPTION OF PROCEDURE: The patient was met in the pre-operative area and their identity was verified. The operative location and laterality was also verified and marked. The patient was brought to the procedure room and was placed supine on the table. After repeat patient identification, local anesthesia was provided and the patient was prepped and draped in the usual sterile fashion. A final timeout was performed verifying the correction patient, procedure, location and laterality.  The left upper extremity was elevated and exsanguinated with an ace wrap and tourniquet inflated to 293mHg. An oblique incision was made over the A1 pulley of the left ring finger. The skin and subcutaneous tissues were divided and neurovacular bundles were protected. The A1 pulley was identified and divided under direct vision. There was complete release of the A1 pulley. There was a large volar retinacular ganglion cyst arising just proximal to the A1 pulley, this was excised and sent to pathology. The wound was irrigated with normal saline. The skin was closed with 4-0 non-absorbable suture in horizontal mattress fashion. A sterile soft bandage was applied. The tourniquet was deflated and the fingers were all pink, warm and well perfused at the end of the case. All counts were correct. The patient was brought to PACU for recovery in stable condition   JMatt Holmes MD

## 2021-02-08 NOTE — Anesthesia Preprocedure Evaluation (Addendum)
Anesthesia Evaluation   Patient awake    Reviewed: Allergy & Precautions, NPO status , Patient's Chart, lab work & pertinent test results  Airway Mallampati: III  TM Distance: >3 FB Neck ROM: Full    Dental no notable dental hx.    Pulmonary sleep apnea , Recent URI:  COVID 01/21/21, congestion remains.,    Pulmonary exam normal breath sounds clear to auscultation       Cardiovascular hypertension (Clonidine patch on currently, took carvedilol, amlodipine, and hydralazine this AM), Pt. on medications and Pt. on home beta blockers Normal cardiovascular exam Rhythm:Regular Rate:Normal  KAJ:GOTLX rhythm Atrial premature complex Low voltage, extremity and precordial leads Probable anteroseptal infarct, old NSR, PAC Confirmed by Lavenia Atlas [phone]) on 09/29/2020 9:18:24 PM   04/27/20 ECHO: 1. Left ventricular ejection fraction, by estimation, is 65 to 70%. The  left ventricle has normal function. The left ventricle has no regional  wall motion abnormalities. There is severe concentric left ventricular  hypertrophy. Left ventricular diastolic  parameters are consistent with Grade II diastolic dysfunction  (pseudonormalization). Elevated left atrial pressure.  2. Right ventricular systolic function is normal. The right ventricular  size is normal. There is mildly elevated pulmonary artery systolic  pressure. The estimated right ventricular systolic pressure is 03.5 mmHg.  3. Left atrial size was moderately dilated.  4. Right atrial size was mildly dilated.  5. A small pericardial effusion is present. The pericardial effusion is  circumferential. There is no evidence of cardiac tamponade.  6. The mitral valve is normal in structure. Mild mitral valve  regurgitation. Mild mitral stenosis. Severe mitral annular calcification.  7. The aortic valve is normal in structure. There is moderate  calcification of the aortic valve.  There is moderate thickening of the  aortic valve. Aortic valve regurgitation is not visualized. Mild to  moderate aortic valve sclerosis/calcification is  present, without any evidence of aortic stenosis.  8. The inferior vena cava is dilated in size with <50% respiratory  variability, suggesting right atrial pressure of 15 mmHg.    Neuro/Psych    GI/Hepatic   Endo/Other  diabetes, Type 2, Insulin DependentHypothyroidism obesity  Renal/GU Renal InsufficiencyRenal disease     Musculoskeletal  (+) Arthritis ,   Abdominal   Peds  Hematology  (+) anemia ,   Anesthesia Other Findings   Reproductive/Obstetrics                           Anesthesia Physical Anesthesia Plan  ASA: 3  Anesthesia Plan: MAC   Post-op Pain Management: Minimal or no pain anticipated   Induction: Intravenous  PONV Risk Score and Plan: 2 and Treatment may vary due to age or medical condition, Propofol infusion, TIVA and Ondansetron  Airway Management Planned: Natural Airway and Simple Face Mask  Additional Equipment: None  Intra-op Plan:   Post-operative Plan:   Informed Consent: I have reviewed the patients History and Physical, chart, labs and discussed the procedure including the risks, benefits and alternatives for the proposed anesthesia with the patient or authorized representative who has indicated his/her understanding and acceptance.     Dental advisory given  Plan Discussed with: CRNA, Anesthesiologist and Surgeon  Anesthesia Plan Comments: (Patient refuses blood products. Will give 57m hydralazine IV x 1 as SBP is 191. Propofol gtt. Natural airway. CNorton Blizzard MD  )       Anesthesia Quick Evaluation

## 2021-02-09 ENCOUNTER — Encounter (HOSPITAL_BASED_OUTPATIENT_CLINIC_OR_DEPARTMENT_OTHER): Payer: Self-pay | Admitting: Orthopedic Surgery

## 2021-02-09 LAB — SURGICAL PATHOLOGY

## 2021-02-13 ENCOUNTER — Other Ambulatory Visit: Payer: Self-pay

## 2021-02-13 ENCOUNTER — Ambulatory Visit (HOSPITAL_COMMUNITY)
Admission: RE | Admit: 2021-02-13 | Discharge: 2021-02-13 | Disposition: A | Discharge status: Home / Self Care | Payer: Medicare Other | Source: Ambulatory Visit | Attending: Internal Medicine | Admitting: Internal Medicine

## 2021-02-13 ENCOUNTER — Encounter (HOSPITAL_COMMUNITY): Payer: Medicare Other

## 2021-02-13 VITALS — BP 179/81 | HR 58 | Temp 97.2°F | Resp 18

## 2021-02-13 DIAGNOSIS — N185 Chronic kidney disease, stage 5: Secondary | ICD-10-CM | POA: Diagnosis present

## 2021-02-13 LAB — POCT HEMOGLOBIN-HEMACUE: Hemoglobin: 10.7 g/dL — ABNORMAL LOW (ref 12.0–15.0)

## 2021-02-13 MED ORDER — EPOETIN ALFA-EPBX 40000 UNIT/ML IJ SOLN: 60000.0000 [IU] | INTRAMUSCULAR | Status: DC

## 2021-02-13 MED ORDER — EPOETIN ALFA-EPBX 40000 UNIT/ML IJ SOLN
INTRAMUSCULAR | Status: AC
  Administered 2021-02-13: 60000 [IU] via SUBCUTANEOUS
  Filled 2021-02-13: qty 2

## 2021-02-13 MED ORDER — SODIUM CHLORIDE 0.9 % IV SOLN
510.0000 mg | Freq: Once | INTRAVENOUS | Status: AC
  Administered 2021-02-13: 510 mg via INTRAVENOUS
  Filled 2021-02-13: qty 510

## 2021-02-27 ENCOUNTER — Ambulatory Visit (HOSPITAL_COMMUNITY)
Admission: RE | Admit: 2021-02-27 | Discharge: 2021-02-27 | Disposition: A | Discharge status: Home / Self Care | Payer: Medicare Other | Source: Ambulatory Visit | Attending: Internal Medicine | Admitting: Internal Medicine

## 2021-02-27 ENCOUNTER — Other Ambulatory Visit: Payer: Self-pay

## 2021-02-27 VITALS — BP 159/74 | HR 58 | Temp 97.3°F | Resp 18

## 2021-02-27 DIAGNOSIS — N185 Chronic kidney disease, stage 5: Secondary | ICD-10-CM | POA: Diagnosis present

## 2021-02-27 LAB — IRON AND TIBC
Iron: 60 ug/dL (ref 28–170)
Saturation Ratios: 25 % (ref 10.4–31.8)
TIBC: 238 ug/dL — ABNORMAL LOW (ref 250–450)
UIBC: 178 ug/dL

## 2021-02-27 LAB — FERRITIN: Ferritin: 799 ng/mL — ABNORMAL HIGH (ref 11–307)

## 2021-02-27 LAB — POCT HEMOGLOBIN-HEMACUE: Hemoglobin: 11 g/dL — ABNORMAL LOW (ref 12.0–15.0)

## 2021-02-27 MED ORDER — EPOETIN ALFA-EPBX 40000 UNIT/ML IJ SOLN
INTRAMUSCULAR | Status: AC
  Filled 2021-02-27: qty 2

## 2021-02-27 MED ORDER — EPOETIN ALFA-EPBX 40000 UNIT/ML IJ SOLN
60000.0000 [IU] | INTRAMUSCULAR | Status: DC
  Administered 2021-02-27: 10:00:00 60000 [IU] via SUBCUTANEOUS

## 2021-03-03 ENCOUNTER — Encounter (HOSPITAL_COMMUNITY): Payer: Self-pay

## 2021-03-03 ENCOUNTER — Other Ambulatory Visit: Payer: Self-pay

## 2021-03-03 ENCOUNTER — Ambulatory Visit (HOSPITAL_COMMUNITY)
Admission: RE | Admit: 2021-03-03 | Discharge: 2021-03-03 | Disposition: A | Discharge status: Home / Self Care | Payer: Medicare Other | Source: Ambulatory Visit | Attending: Student | Admitting: Student

## 2021-03-03 VITALS — BP 183/78 | HR 98 | Temp 98.3°F | Resp 16

## 2021-03-03 DIAGNOSIS — L853 Xerosis cutis: Secondary | ICD-10-CM | POA: Diagnosis not present

## 2021-03-03 MED ORDER — HYDROCORTISONE 2.5 % EX LOTN
TOPICAL_LOTION | Freq: Two times a day (BID) | CUTANEOUS | 0 refills | Status: AC
Start: 2021-03-03 — End: 2021-03-10

## 2021-03-03 NOTE — ED Provider Notes (Signed)
[facility]    CSN: [id number] Arrival date & time: 03/03/21  1232      History   Chief Complaint Chief Complaint  Patient presents with   Rash    HPI Tiffany [name] is a 71 y.o. female presenting with a dry itchy rash.  Medical history shingles 10/22 per patient.  States that the skin on her upper back has been dry and itchy for about 2 weeks, has not tried interventions at home. Denies burning, tingling, pain.  States her shingles was on the left lower back, and the itchy skin is on her right upper back  HPI  Past Medical History:  Diagnosis Date   ALLERGIC RHINITIS    Arthritis    CHF (congestive heart failure) (HCC)    CKD (chronic kidney disease)    COVID 01/21/2021   + HOME TEST  HAD COUGH CONGESTION AND MALAISE, OCC CONGESTION NOW   Heart murmur    at birth   Hyperlipidemia    Hypertension    Hypothyroid    Iron deficiency anemia    Obesity    OSA on CPAP    Refusal of blood transfusions as patient is Jehovah's Witness    Shingles 11/2020   LEFT BACK   Type 2 dm with insulin use    checks abg qday runs 125   Wears glasses     Patient Active Problem List   Diagnosis Date Noted   Volume overload 09/30/2020   Symptomatic anemia 09/29/2020   ESRD (end stage renal disease) ([place]) 06/04/2020   Acute on chronic diastolic (congestive) heart failure ([place]) 04/26/2020   Type 2 diabetes mellitus with stage 5 chronic kidney disease ([place]) 04/26/2020   Acute CHF (congestive heart failure) ([place]) 02/01/2020   S/P left TKA 07/01/2019   Status post total left knee replacement 07/01/2019   Hyperlipidemia 09/12/2016   Type 2 diabetes mellitus without complications ([place]) 01/75/1025   Tachycardia 08/26/2016   Palpitation 08/26/2016   SOB (shortness of breath) 08/26/2016   Chronic kidney disease, stage V ([place]) 04/28/2014   Chronic diastolic heart failure ([place]) 05/05/2013   Essential hypertension 05/05/2013   HEART MURMUR, SYSTOLIC 85/27/7824    Hypothyroidism 02/12/2007   OBESITY 01/08/2007   Obstructive sleep apnea 01/08/2007   Seasonal and perennial allergic rhinitis 01/08/2007    Past Surgical History:  Procedure Laterality Date   BREAST BIOPSY     left breast per pt; benign 20+ yrs ago   [place]   COLONOSCOPY WITH PROPOFOL N/A 10/24/2020   Procedure: COLONOSCOPY WITH PROPOFOL;  Surgeon: Ronnette Juniper, MD;  Location: [facility];  Service: Gastroenterology;  Laterality: N/A;   KNEE SURGERY Bilateral    x 2   LUMBAR DISC SURGERY     x 2   POLYPECTOMY  10/24/2020   Procedure: POLYPECTOMY;  Surgeon: Ronnette Juniper, MD;  Location: [facility];  Service: Gastroenterology;;   REDUCTION MAMMAPLASTY Bilateral 1985   TOE SURGERY     yrs ago per pt on 02-02-2021   TOTAL ABDOMINAL HYSTERECTOMY  1997   partial hysterectomy- still has ovaries   TOTAL KNEE ARTHROPLASTY Left 07/01/2019   Procedure: TOTAL KNEE ARTHROPLASTY;  Surgeon: Paralee Cancel, MD;  Location: [facility];  Service: Orthopedics;  Laterality: Left;  70 min NO BLOOD PRODUCTS!!   TREATMENT FISTULA ANAL     yrs ago per pt 0n 02-02-2021   TRIGGER FINGER RELEASE Left 02/08/2021   Procedure: Left Ring trigger finger release, left ring finger  volar ganglion cyst excision;  Surgeon: Orene Desanctis, MD;  Location: [facility];  Service: Orthopedics;  Laterality: Left;  with local anesthesia    OB History   No obstetric history on file.      Home Medications    Prior to Admission medications   Medication Sig Start Date End Date Taking? Authorizing Provider  hydrocortisone 2.5 % lotion Apply topically 2 (two) times daily for 7 days. 03/03/21 03/10/21 Yes Hazel Sams, PA-C  albuterol (VENTOLIN HFA) 108 (90 Base) MCG/ACT inhaler Inhale 2 puffs into the lungs every 6 (six) hours as needed for wheezing or shortness of breath.    [provider]  amLODipine (NORVASC) 10 MG tablet Take 10 mg by mouth 2 (two) times daily.    [provider]  aspirin EC 81 MG tablet Take 81 mg by mouth every evening. Swallow whole.    [provider]  B Complex-C (B-COMPLEX WITH VITAMIN C) tablet Take 1 tablet by mouth in the morning.    [provider]  budesonide-formoterol [facility]) 160-4.5 MCG/ACT inhaler Inhale 2 puffs then rinse mouth, twice daily 09/25/20   Baird Lyons D, MD  calcitRIOL (ROCALTROL) 0.5 MCG capsule Take 0.5 mcg by mouth in the morning. 08/03/20   [provider]  carvedilol (COREG) 12.5 MG tablet Take 12.5 mg by mouth 2 (two) times daily. 09/15/20   [provider]  cetirizine (ZYRTEC) 10 MG tablet Take 10 mg by mouth daily as needed for allergies.    [provider]  cloNIDine (CATAPRES - DOSED IN MG/24 HR) 0.3 mg/24hr patch Place 0.3 mg onto the skin every Friday.    [provider]  hydrALAZINE (APRESOLINE) 50 MG tablet Take 50 mg by mouth 2 (two) times daily. 08/10/20   [provider]  LANTUS SOLOSTAR 100 UNIT/ML Solostar Pen Inject 15 Units into the skin at bedtime. 08/08/20   [provider]  levothyroxine (SYNTHROID) 112 MCG tablet Take 224 mcg by mouth daily before breakfast.    [provider]  Multiple Vitamins-Minerals (PRESERVISION AREDS 2 PO) Take 1 tablet by mouth in the morning.    [provider]  nirmatrelvir/ritonavir EUA, renal dosing, (PAXLOVID) 10 x 150 MG & 10 x 100MG  TABS Take 4 tablets by mouth 2 (two) times daily. Take nirmatrelvir (150 mg) one tablet twice daily for 5 days and ritonavir (100 mg) one tablet twice daily for 5 days.    [provider]  Omega-3 Fatty Acids (FISH OIL) 500 MG CAPS Take 1 capsule by mouth at bedtime.    [provider]  Propylene Glycol (SYSTANE COMPLETE) 0.6 % SOLN Place 1 drop into both eyes in the morning, at noon, in the evening, and at bedtime.    [provider]  torsemide (DEMADEX) 100 MG tablet Take 100-150 mg by mouth See admin instructions.  Take 1 tablet (100 mg) by mouth every other day alternating with taking 1.5 tablets (150 mg) by mouth every other day.    [provider]  zolpidem (AMBIEN) 10 MG tablet Take 10 mg by mouth at bedtime.    [provider]    Family History Family History  Problem Relation Age of Onset   Prostate cancer Father    Congestive Heart Failure Mother    Diabetes Sister    Other Sister        septis   Breast cancer Neg Hx     Social History Social History   Tobacco  Use   Smoking status: Never   Smokeless tobacco: Never  Vaping Use   Vaping Use: Never used  Substance Use Topics   Alcohol use: Not Currently   Drug use: No     Allergies   Other and Sulfa antibiotics   Review of Systems Review of Systems  Skin:  Positive for rash.  All other systems reviewed and are negative.   Physical Exam Triage Vital Signs ED Triage Vitals [03/03/21 1408]  Enc Vitals Group     BP (!) 183/78     Pulse Rate 98     Resp 16     Temp 98.3 F (36.8 C)     Temp Source Oral     SpO2 100 %     Weight      Height      Head Circumference      Peak Flow      Pain Score      Pain Loc      Pain Edu?      Excl. in [place]?    No data found.  Updated Vital Signs BP (!) 183/78 (BP Location: Left Arm)    Pulse 98    Temp 98.3 F (36.8 C) (Oral)    Resp 16    SpO2 100%   Visual Acuity Right Eye Distance:   Left Eye Distance:   Bilateral Distance:    Right Eye Near:   Left Eye Near:    Bilateral Near:     Physical Exam Vitals reviewed.  Constitutional:      General: She is not in acute distress.    Appearance: Normal appearance. She is not ill-appearing.  HENT:     Head: Normocephalic and atraumatic.  Pulmonary:     Effort: Pulmonary effort is normal.  Skin:    Comments: See image below Dry skin on back with few excoriations. No erythema, warmth, swelling, tenderness. No rash in dermatomal distribution.  Neurological:     General: No focal deficit present.      Mental Status: She is alert and oriented to person, place, and time.  Psychiatric:        Mood and Affect: Mood normal.        Behavior: Behavior normal.        Thought Content: Thought content normal.        Judgment: Judgment normal.      UC Treatments / Results  Labs (all labs ordered are listed, but only abnormal results are displayed) Labs Reviewed - No data to display  EKG   Radiology No results found.  Procedures Procedures (including critical care time)  Medications Ordered in UC Medications - No data to display  Initial Impression / Assessment and Plan / UC Course  I have reviewed the triage vital signs and the nursing notes.  Pertinent labs & imaging results that were available during my care of the patient were reviewed by me and considered in my medical decision making (see chart for details).     This patient is a very pleasant 71 y.o. year old female presenting with dry skin dermatitis.  She did have a shingles rash about 4 months ago, current rash presents as dry skin across upper back without dermatomal distribution; symptoms are consistent with dry skin dermatitis.  She has not tried any interventions, so low potency hydrocortisone cream sent and also recommended emollient moisturizer. ED return precautions discussed. Patient verbalizes understanding and agreement.     Final Clinical Impressions(s) / UC  Diagnoses   Final diagnoses:  Dry skin dermatitis     Discharge Instructions      -Hydrocortisone lotion 1-2x daily  -Get a thick fragrance free moisturizer and apply that on top of the steroid cream at bedtime.   ED Prescriptions     Medication Sig Dispense Auth. Provider   hydrocortisone 2.5 % lotion Apply topically 2 (two) times daily for 7 days. 59 mL Hazel Sams, PA-C      PDMP not reviewed this encounter.   Hazel Sams, PA-C 03/03/21 1439

## 2021-03-03 NOTE — ED Triage Notes (Signed)
Pt present with rash on her back. She reported having shingles a couple month ago.

## 2021-03-03 NOTE — Discharge Instructions (Addendum)
-  Hydrocortisone lotion 1-2x daily  -Get a thick fragrance free moisturizer and apply that on top of the steroid cream at bedtime.

## 2021-03-09 ENCOUNTER — Ambulatory Visit: Payer: Medicare Other | Admitting: Interventional Cardiology

## 2021-03-13 ENCOUNTER — Encounter (HOSPITAL_COMMUNITY)
Admission: RE | Admit: 2021-03-13 | Discharge: 2021-03-13 | Disposition: A | Discharge status: Home / Self Care | Payer: Medicare Other | Source: Ambulatory Visit | Attending: Internal Medicine | Admitting: Internal Medicine

## 2021-03-13 ENCOUNTER — Other Ambulatory Visit: Payer: Self-pay

## 2021-03-13 VITALS — BP 162/66 | HR 56 | Temp 97.3°F | Resp 18

## 2021-03-13 DIAGNOSIS — N185 Chronic kidney disease, stage 5: Secondary | ICD-10-CM | POA: Insufficient documentation

## 2021-03-13 LAB — RENAL FUNCTION PANEL
Albumin: 3.8 g/dL (ref 3.5–5.0)
Anion gap: 12 (ref 5–15)
BUN: 86 mg/dL — ABNORMAL HIGH (ref 8–23)
CO2: 22 mmol/L (ref 22–32)
Calcium: 9.8 mg/dL (ref 8.9–10.3)
Chloride: 106 mmol/L (ref 98–111)
Creatinine, Ser: 4.08 mg/dL — ABNORMAL HIGH (ref 0.44–1.00)
GFR, Estimated: 11 mL/min — ABNORMAL LOW (ref >60)
Glucose, Bld: 146 mg/dL — ABNORMAL HIGH (ref 70–99)
Phosphorus: 5.4 mg/dL — ABNORMAL HIGH (ref 2.5–4.6)
Potassium: 4.6 mmol/L (ref 3.5–5.1)
Sodium: 140 mmol/L (ref 135–145)

## 2021-03-13 LAB — POCT HEMOGLOBIN-HEMACUE: Hemoglobin: 12.1 g/dL (ref 12.0–15.0)

## 2021-03-13 MED ORDER — EPOETIN ALFA-EPBX 40000 UNIT/ML IJ SOLN: 60000.0000 [IU] | INTRAMUSCULAR | Status: DC

## 2021-03-14 LAB — PTH, INTACT AND CALCIUM
Calcium, Total (PTH): 9.7 mg/dL (ref 8.7–10.3)
PTH: 129 pg/mL — ABNORMAL HIGH (ref 15–65)

## 2021-03-16 ENCOUNTER — Telehealth: Payer: Self-pay | Admitting: Internal Medicine

## 2021-03-16 NOTE — Telephone Encounter (Signed)
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

## 2021-03-19 ENCOUNTER — Telehealth: Payer: Self-pay | Admitting: Internal Medicine

## 2021-03-19 DIAGNOSIS — G4733 Obstructive sleep apnea (adult) (pediatric): Secondary | ICD-10-CM

## 2021-03-20 NOTE — Telephone Encounter (Signed)
I have updated CPAP supplier. Per the patient she needed new CPAP supplies and order was processed.

## 2021-03-23 ENCOUNTER — Other Ambulatory Visit: Payer: Self-pay

## 2021-03-27 ENCOUNTER — Encounter (HOSPITAL_COMMUNITY)
Admission: RE | Admit: 2021-03-27 | Discharge: 2021-03-27 | Disposition: A | Discharge status: Home / Self Care | Payer: Medicare Other | Source: Ambulatory Visit | Attending: Internal Medicine | Admitting: Internal Medicine

## 2021-03-27 VITALS — BP 160/77 | HR 62 | Temp 97.3°F | Resp 18

## 2021-03-27 DIAGNOSIS — N185 Chronic kidney disease, stage 5: Secondary | ICD-10-CM | POA: Diagnosis not present

## 2021-03-27 LAB — IRON AND TIBC
Iron: 111 ug/dL (ref 28–170)
Saturation Ratios: 49 % — ABNORMAL HIGH (ref 10.4–31.8)
TIBC: 227 ug/dL — ABNORMAL LOW (ref 250–450)
UIBC: 116 ug/dL

## 2021-03-27 LAB — FERRITIN: Ferritin: 768 ng/mL — ABNORMAL HIGH (ref 11–307)

## 2021-03-27 MED ORDER — EPOETIN ALFA-EPBX 40000 UNIT/ML IJ SOLN
INTRAMUSCULAR | Status: AC
  Administered 2021-03-27: 60000 [IU]
  Filled 2021-03-27: qty 2

## 2021-03-27 MED ORDER — EPOETIN ALFA-EPBX 40000 UNIT/ML IJ SOLN: 60000.0000 [IU] | INTRAMUSCULAR | Status: DC

## 2021-03-28 ENCOUNTER — Telehealth: Payer: Self-pay | Admitting: Physical Medicine and Rehabilitation

## 2021-03-28 ENCOUNTER — Ambulatory Visit: Payer: Medicare Other | Admitting: Internal Medicine

## 2021-03-28 LAB — POCT HEMOGLOBIN-HEMACUE: Hemoglobin: 11.9 g/dL — ABNORMAL LOW (ref 12.0–15.0)

## 2021-03-28 NOTE — Telephone Encounter (Signed)
Patient called needing to schedule an appointment for her back. Patient said she has a burning sensation in her buttocks. The number to contact patient is [phone]

## 2021-03-29 ENCOUNTER — Telehealth: Payer: Self-pay | Admitting: Internal Medicine

## 2021-03-29 ENCOUNTER — Telehealth: Payer: Self-pay | Admitting: Interventional Cardiology

## 2021-03-29 NOTE — Telephone Encounter (Signed)
Mandi/Dr. Annamaria Boots, please advise if forms were completed for pt.

## 2021-03-29 NOTE — Telephone Encounter (Signed)
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  She wants to know if it has been faxed back.

## 2021-03-29 NOTE — Telephone Encounter (Signed)
Just found that form. I will take care of it.

## 2021-03-29 NOTE — Telephone Encounter (Signed)
Spoke with pt and made her aware that I was just given her paperwork.  Advised we will get this completed and I will give to medical records once completed.  She would like to pick up her copies when they are completed.

## 2021-03-30 NOTE — Telephone Encounter (Signed)
Called to let patient know that her form are ready to be picked up and that they would be placed up front. She expressed understanding. Nothing further needed at this time.

## 2021-04-02 ENCOUNTER — Telehealth: Payer: Self-pay | Admitting: Physical Medicine and Rehabilitation

## 2021-04-02 NOTE — Telephone Encounter (Signed)
Patient called. She missed a call from [facility] to schedule appointment with Dr. Ernestina Patches. Her call back number is [phone]

## 2021-04-11 ENCOUNTER — Telehealth: Payer: Self-pay | Admitting: Physical Medicine and Rehabilitation

## 2021-04-11 ENCOUNTER — Telehealth: Payer: Self-pay

## 2021-04-11 NOTE — Telephone Encounter (Signed)
Patient called advised it's her Piriformis muscle that's hurting and she will probably need an injection. The number to contact patient is [phone] ?

## 2021-04-11 NOTE — Telephone Encounter (Signed)
Patient called into the office returning a phone call for Dr. Romona Curls office  ?

## 2021-04-12 ENCOUNTER — Encounter: Payer: Self-pay | Admitting: Internal Medicine

## 2021-04-12 ENCOUNTER — Telehealth: Payer: Self-pay | Admitting: Physical Medicine and Rehabilitation

## 2021-04-12 ENCOUNTER — Other Ambulatory Visit: Payer: Self-pay | Admitting: Physical Medicine and Rehabilitation

## 2021-04-12 DIAGNOSIS — M5416 Radiculopathy, lumbar region: Secondary | ICD-10-CM

## 2021-04-12 NOTE — Telephone Encounter (Signed)
Called patient this afternoon, informed her that we are ordering lumbar MRI and would re-schedule her office visit after MRI images are obtained.  ?

## 2021-04-13 NOTE — Progress Notes (Signed)
?Subjective:  ? ? Patient ID: Tiffany [name], female    DOB: 02-12-1950, 71 y.o.   MRN: [id number] ? ?HPI ? female never smoker followed for allergic rhinitis, OSA, complicated by obesity, HTN, hypothyroid, DM2, CKDV, ?NPSG 04/25/03- AHI 14/ hr, desaturation to 86%, body weight 344 lbs ? ?----------------------------------------------------------------------------------- ? ? ?09/25/20- 71 year old female never smoker followed for allergic rhinitis, OSA, complicated by obesity, HTN, hypothyroid, DM2, dCHF, CKDV(refused hemodialysis), , Comfort Care, ?-Symbicort 160, Ventolin hfa ?CPAP 5-15/ [facility] ?Download- compliance 83%, AHI 0.5/ hr ?Body weight today-244 lbs ?Covid vax-4 Phizer ?Hosp in May with ESRD- refuses HD and chooses comfort care. Seen at [facility]. Has met Palliative Care. Hospice doctor gave Symbicort- helps a little. ?-----SOB, upper back pain, having chest pains when walking.  ?Last saw Cardiology in April  for CHF. ?Dyspneic walking from lobby but arrival O2 sat 100%. ?Describes pains mid sternum, bilateral ribs and midback, esp w exertion. Pains can be duplicated by pressure on sternum, forced deep breath, moving arms. She looked it up and feels she has costochondritis, Takes occ Tylenol.  ?Aware of anemia- last Hgb 7.2 on 8/16. ?Noted some blood in her mouth on waking this morning, without recurrence.. Normally bruises easily but no other blood or changes noted.  ?CXR 04/26/20- ?IMPRESSION: ?Cardiomegaly, vascular congestion. ? ?04/13/21- 71 year old female never smoker followed for allergic rhinitis, OSA, complicated by obesity, HTN, hypothyroid, DM2, dCHF, CKDV(refused hemodialysis), Comfort Care, ?-Symbicort 160, Ventolin hfa ?CPAP 5-15/ [facility] ?Download- compliance  97%, AHI 0.4/ hr ?Body weight today-216 lbs  ?Covid vax-5 Phizer ?Flu vax-had ?Followed by [facility] and will start PD when indicated. ?Overall breathing is comfortable and she denies cough or wheeze.  Says she "very seldom" uses inhalers  and does not need refill. ?She had requested change from [facility] to [facility], hoping for a location closer to her home and for more customer service for mask fitting.  Dislikes current headgear. ?Says Ambien not helping insomnia. I didn't offer alternative at this visit due to renal function concerns.  ?CXR 09/29/20- ?IMPRESSION: ?Unchanged cardiomegaly with improved pulmonary edema compared to the ?study from 09/25/2020. ? ?ROS-see HPI + = positive ?Constitutional:   No-   weight loss, night sweats, fevers, chills, fatigue, lassitude. ?HEENT:   No-  headaches, difficulty swallowing, tooth/dental problems, sore throat,  ?     No-  sneezing, itching, ear ache, nasal congestion, post nasal drip,  ?CV:  + chest pain, orthopnea, PND, swelling in lower extremities, anasarca,  dizziness, palpitations ?Resp: + shortness of breath with exertion or at rest.   ?           No-   productive cough,  No non-productive cough,  No- coughing up of blood.   ?           No-   change in color of mucus.  No- wheezing.   ?Skin: No-   rash or lesions. ?GI:  No-   heartburn, indigestion, abdominal pain, nausea, vomiting,  ?GU:  ?MS:  No-   joint pain or swelling.   ?Neuro-     nothing unusual ?Psych:  No- change in mood or affect. No depression or anxiety.  No memory loss. ? ?OBJ- Physical Exam  ?General- Alert, Oriented, Affect-appropriate, Distress- none acute, +obese ?Skin- rash-none, lesions- none, excoriation- none ?Lymphadenopathy- none ?Head- atraumatic ?           Eyes- Gross vision intact,+strabismus ?           Ears- Hearing, canals-normal ?  Nose- Clear, no-Septal dev, mucus, polyps, erosion, perforation  ?           Throat- Mallampati III , mucosa clear , drainage- none, tonsils- atrophic ?Neck- flexible , trachea midline, no stridor , thyroid nl, carotid no bruit ?Chest - symmetrical excursion , unlabored ?          Heart/CV- RRR ,+ 2/6 systolic murmur RUSB , no gallop  , no rub, nl s1 s2 ?                          - JVD-  none , edema- none, stasis changes- none, varices- none ?          Lung- clear to P&A, wheeze- none, cough- none , dullness-none, rub- none ?          Chest wall-  ?Abd-  ?Br/ Gen/ Rectal- Not done, not indicated ?Extrem- cyanosis- none, clubbing, none, atrophy- none, strength- nl ?Neuro- grossly intact to observation ? ? ?

## 2021-04-16 ENCOUNTER — Ambulatory Visit: Payer: Medicare Other | Admitting: Internal Medicine

## 2021-04-16 ENCOUNTER — Other Ambulatory Visit: Payer: Self-pay

## 2021-04-16 ENCOUNTER — Encounter: Payer: Self-pay | Admitting: Internal Medicine

## 2021-04-16 DIAGNOSIS — G4733 Obstructive sleep apnea (adult) (pediatric): Secondary | ICD-10-CM | POA: Diagnosis not present

## 2021-04-16 DIAGNOSIS — N186 End stage renal disease: Secondary | ICD-10-CM | POA: Diagnosis not present

## 2021-04-16 NOTE — Assessment & Plan Note (Signed)
She benefits from CPAP and continues good compliance and control.  She is requesting change from [facility] to [facility] and needs help with mask fitting. ?Plan-DME change.  Refer for mask fitting as requested.  Continue auto 5-15. ?

## 2021-04-16 NOTE — Patient Instructions (Signed)
Order- Patient requests CPAP management transfer from [place] to [facility]. Continue autopap 5-15, mask of choice, humidifier, supplies, airview/ card ? ?Order- refer to sleep center for mask fitting/desensitization    dx OSA ? ?Please call if we can help ?

## 2021-04-16 NOTE — Assessment & Plan Note (Signed)
She apparently plans peritoneal dialysis when needed. ?

## 2021-04-17 ENCOUNTER — Ambulatory Visit: Payer: Medicare Other | Admitting: Physical Medicine and Rehabilitation

## 2021-04-19 ENCOUNTER — Other Ambulatory Visit: Payer: Self-pay | Admitting: Physical Medicine and Rehabilitation

## 2021-04-22 ENCOUNTER — Ambulatory Visit
Admission: RE | Admit: 2021-04-22 | Discharge: 2021-04-22 | Disposition: A | Discharge status: Home / Self Care | Payer: Medicare Other | Source: Ambulatory Visit | Attending: Physical Medicine and Rehabilitation | Admitting: Physical Medicine and Rehabilitation

## 2021-04-22 ENCOUNTER — Other Ambulatory Visit: Payer: Self-pay

## 2021-04-22 DIAGNOSIS — M5416 Radiculopathy, lumbar region: Secondary | ICD-10-CM

## 2021-04-24 ENCOUNTER — Ambulatory Visit (HOSPITAL_COMMUNITY)
Admission: RE | Admit: 2021-04-24 | Discharge: 2021-04-24 | Disposition: A | Discharge status: Home / Self Care | Payer: Medicare Other | Source: Ambulatory Visit | Attending: Internal Medicine | Admitting: Internal Medicine

## 2021-04-24 ENCOUNTER — Other Ambulatory Visit: Payer: Self-pay

## 2021-04-24 VITALS — BP 164/71 | HR 57 | Temp 98.1°F

## 2021-04-24 DIAGNOSIS — N185 Chronic kidney disease, stage 5: Secondary | ICD-10-CM | POA: Insufficient documentation

## 2021-04-24 LAB — IRON AND TIBC
Iron: 129 ug/dL (ref 28–170)
Saturation Ratios: 52 % — ABNORMAL HIGH (ref 10.4–31.8)
TIBC: 248 ug/dL — ABNORMAL LOW (ref 250–450)
UIBC: 119 ug/dL

## 2021-04-24 LAB — RENAL FUNCTION PANEL
Albumin: 3.5 g/dL (ref 3.5–5.0)
Anion gap: 13 (ref 5–15)
BUN: 92 mg/dL — ABNORMAL HIGH (ref 8–23)
CO2: 22 mmol/L (ref 22–32)
Calcium: 9.9 mg/dL (ref 8.9–10.3)
Chloride: 106 mmol/L (ref 98–111)
Creatinine, Ser: 4.33 mg/dL — ABNORMAL HIGH (ref 0.44–1.00)
GFR, Estimated: 10 mL/min — ABNORMAL LOW (ref >60)
Glucose, Bld: 152 mg/dL — ABNORMAL HIGH (ref 70–99)
Phosphorus: 5.1 mg/dL — ABNORMAL HIGH (ref 2.5–4.6)
Potassium: 4.8 mmol/L (ref 3.5–5.1)
Sodium: 141 mmol/L (ref 135–145)

## 2021-04-24 LAB — POCT HEMOGLOBIN-HEMACUE: Hemoglobin: 10.7 g/dL — ABNORMAL LOW (ref 12.0–15.0)

## 2021-04-24 LAB — FERRITIN: Ferritin: 734 ng/mL — ABNORMAL HIGH (ref 11–307)

## 2021-04-24 MED ORDER — EPOETIN ALFA-EPBX 40000 UNIT/ML IJ SOLN
INTRAMUSCULAR | Status: AC
  Filled 2021-04-24: qty 1

## 2021-04-24 MED ORDER — EPOETIN ALFA-EPBX 40000 UNIT/ML IJ SOLN
30000.0000 [IU] | INTRAMUSCULAR | Status: DC
  Administered 2021-04-24: 30000 [IU] via SUBCUTANEOUS

## 2021-04-25 LAB — PTH, INTACT AND CALCIUM
Calcium, Total (PTH): 9.9 mg/dL (ref 8.7–10.3)
PTH: 84 pg/mL — ABNORMAL HIGH (ref 15–65)

## 2021-04-30 ENCOUNTER — Ambulatory Visit: Payer: Medicare Other | Admitting: Physical Medicine and Rehabilitation

## 2021-04-30 ENCOUNTER — Other Ambulatory Visit: Payer: Self-pay

## 2021-04-30 ENCOUNTER — Encounter: Payer: Self-pay | Admitting: Physical Medicine and Rehabilitation

## 2021-04-30 ENCOUNTER — Ambulatory Visit (HOSPITAL_BASED_OUTPATIENT_CLINIC_OR_DEPARTMENT_OTHER): Payer: Medicare Other | Attending: Internal Medicine | Admitting: Internal Medicine

## 2021-04-30 VITALS — BP 170/77 | HR 62

## 2021-04-30 DIAGNOSIS — M4726 Other spondylosis with radiculopathy, lumbar region: Secondary | ICD-10-CM

## 2021-04-30 DIAGNOSIS — M5442 Lumbago with sciatica, left side: Secondary | ICD-10-CM

## 2021-04-30 DIAGNOSIS — M5416 Radiculopathy, lumbar region: Secondary | ICD-10-CM | POA: Diagnosis not present

## 2021-04-30 DIAGNOSIS — M48062 Spinal stenosis, lumbar region with neurogenic claudication: Secondary | ICD-10-CM

## 2021-04-30 DIAGNOSIS — G8929 Other chronic pain: Secondary | ICD-10-CM

## 2021-04-30 DIAGNOSIS — M5441 Lumbago with sciatica, right side: Secondary | ICD-10-CM

## 2021-04-30 DIAGNOSIS — M961 Postlaminectomy syndrome, not elsewhere classified: Secondary | ICD-10-CM

## 2021-04-30 DIAGNOSIS — G4733 Obstructive sleep apnea (adult) (pediatric): Secondary | ICD-10-CM

## 2021-04-30 NOTE — Progress Notes (Signed)
? ?Chenell Lozon - 71 y.o. female MRN [id number]  Date of birth: [birth year] ? ?Office Visit Note: ?Visit Date: 04/30/2021 ?PCP: Nolene Ebbs, MD ?Referred by: Nolene Ebbs, MD ? ?Subjective: ?Chief Complaint  ?Patient presents with  ? Lower Back - Pain  ? Right Leg - Pain  ? Left Leg - Pain  ? ?HPI: Tiffany [name] is a 71 y.o. female who comes in today or evaluation of chronic, worsening and severe bilateral lower back pain radiating to buttocks and down posterior thighs to knees.  Patient reports pain has been ongoing for several years, worsened over the last several weeks.  Patient states her pain is exacerbated by standing, walking and activity.  She describes pain as a constant sore and burning sensation, currently rates as 8 out of 10.  Patient reports some relief of pain with home exercise regimen, rest and use of OTC medications as needed.  Patient's recent lumbar MRI exhibits solid L3-L4 posterior lumbar interbody fusion and previous left laminectomy noted at L4-L5.  There is mild to moderate canal stenosis and bilateral subarticular recess stenosis without definite impingement. Patient reports history of 2 lumbar surgeries 20+ years ago. No high grade spinal canal stenosis noted. Patient has a history of multiple lumbar epidural steroid injections performed infrequently in our office with significant relief of pain, however patient states she feels like her current symptoms are considerably different from previous issues. Patient states her severe pain negatively impacts her daily life, states she has to take breaks frequently when performing tasks. Patient denies focal weakness, numbness and tingling. Patient denies recent trauma or falls.  ? ?Review of Systems  ?Musculoskeletal:  Positive for back pain.  ?Neurological:  Negative for tingling, sensory change, focal weakness and weakness.  ?All other systems reviewed and are negative. Otherwise per HPI. ? ?Assessment & Plan: ?Visit Diagnoses:  ?   ICD-10-CM   ?1. Lumbar radiculopathy  M54.16 Ambulatory referral to Physical Medicine Rehab  ?  ?2. Other spondylosis with radiculopathy, lumbar region  M47.26 Ambulatory referral to Physical Medicine Rehab  ?  ?3. Chronic bilateral low back pain with bilateral sciatica  M54.42 Ambulatory referral to Physical Medicine Rehab  ? M54.41   ? G89.29   ?  ?4. Spinal stenosis of lumbar region with neurogenic claudication  M48.062 Ambulatory referral to Physical Medicine Rehab  ?  ?5. Post laminectomy syndrome  M96.1 Ambulatory referral to Physical Medicine Rehab  ?  ?   ?Plan: Findings:  ?Chronic, worsening and severe bilateral lower back pain radiating to buttocks and down posterior thighs to knees. Patient continues to have significant pain despite good conservative therapies such as home exercise regimen, rest and use of medications as needed. I did review patients recent lumbar MRI with her today using images and spine model. Patients clinical presentation and exam are consistent with S1 nerve pattern. We believe the next step is to perform a diagnostic and hopefully therapeutic bilateral S1 transforaminal epidural steroid injection under fluoroscopic guidance. Patient has no questions regarding lumbar epidural steroid injection procedure at this time. Patient encouraged to remain active and to continue home exercise regimen as tolerated. No red flag symptoms noted upon exam today.   ? ?Meds & Orders: No orders of the defined types were placed in this encounter. ?  ?Orders Placed This Encounter  ?Procedures  ? Ambulatory referral to Physical Medicine Rehab  ?  ?Follow-up: Return for Bilateral S1 transforaminal epidural steroid injection.  ? ?Procedures: ?No procedures performed  ?   ? ?  Clinical History: ?EXAM: ?MRI LUMBAR SPINE WITHOUT CONTRAST ?  ?TECHNIQUE: ?Multiplanar, multisequence MR imaging of the lumbar spine was ?performed. No intravenous contrast was administered. ?  ?COMPARISON:  09/27/2017. ?   ?FINDINGS: ?Segmentation:  Standard. ?  ?Alignment:  No substantial sagittal subluxation ?  ?Vertebrae: No specific evidence of fracture or ?discitis/osteomyelitis. L3-L4 PLIF. No evidence of abnormal ?enhancement. ?  ?Conus medullaris and cauda equina: Conus extends to the T12 level. ?Conus appears normal. No evidence of abnormal enhancement of the ?conus or cauda equina. ?  ?Paraspinal and other soft tissues: Bilateral renal cysts. ?  ?Disc levels: ?  ?Motion limited assessment. ?  ?T12-L1: No significant disc protrusion, foraminal stenosis, or canal ?stenosis. ?  ?L1-L2: No significant disc protrusion, foraminal stenosis, or canal ?stenosis. ?  ?L2-L3: Disc bulge, facet arthropathy, and ligamentum flavum ?thickening. Resulting mild canal stenosis, similar. Mild right ?foraminal stenosis. ?  ?L3-L4: Previous PLIF with solid fusion across the disc space. Patent ?canal and foramina. ?  ?L4-L5: Previous left laminectomy endplate spurring and mild disc ?bulging. Similar mild bilateral subarticular recess narrowing ?without significant canal stenosis. Evaluation of foramina is ?limited by artifact without evidence of compressive foraminal ?stenosis. ?  ?L5-S1: Posterior disc bulge and endplate spurring. Bilateral facet ?arthropathy. Similar mild to moderate canal stenosis and bilateral ?subarticular recess stenosis without definite impingement. Similar ?probably mild bilateral foraminal stenosis without definite ?impingement. ?  ?IMPRESSION: ?1. Motion limited study with similar multilevel degenerative change, ?as detailed above. ?2. Solid L3-L4 PLIF with patent canal and foramina at this level. ?  ?  ?Electronically Signed ?  By: Margaretha Sheffield M.D. ?  On: 04/24/2021 08:35  ? ?She reports that she has never smoked. She has never used smokeless tobacco.  ?Recent Labs  ?  09/30/20 ?0117  ?HGBA1C 5.8*  ? ? ?Objective:  VS:  HT:    WT:   BMI:     BP: ?(!) 170/77  HR:62bpm  TEMP: ( )  RESP:  ?Physical  Exam ?Vitals and nursing note reviewed.  ?HENT:  ?   Head: Normocephalic and atraumatic.  ?   Right Ear: External ear normal.  ?   Left Ear: External ear normal.  ?   Nose: Nose normal.  ?   Mouth/Throat:  ?   Mouth: Mucous membranes are moist.  ?Eyes:  ?   Extraocular Movements: Extraocular movements intact.  ?Cardiovascular:  ?   Rate and Rhythm: Normal rate.  ?   Pulses: Normal pulses.  ?Pulmonary:  ?   Effort: Pulmonary effort is normal.  ?Abdominal:  ?   General: Abdomen is flat. There is no distension.  ?Musculoskeletal:     ?   General: Tenderness present.  ?   Cervical back: Normal range of motion.  ?   Comments: Pt is slow to rise from seated position to standing. Good lumbar range of motion. Strong distal strength without clonus, no pain upon palpation of greater trochanters. Dysesthesias noted to bilateral S1 dermatomes. Sensation intact bilaterally. Walks independently, gait steady.   ?Skin: ?   General: Skin is warm and dry.  ?   Capillary Refill: Capillary refill takes less than 2 seconds.  ?Neurological:  ?   General: No focal deficit present.  ?   Mental Status: She is alert and oriented to person, place, and time.  ?  ?Ortho Exam ? ?Imaging: ?No results found. ? ?Past Medical/Family/Surgical/Social History: ?Medications & Allergies reviewed per EMR, new medications updated. ?Patient Active Problem List  ?  Diagnosis Date Noted  ? Volume overload 09/30/2020  ? Symptomatic anemia 09/29/2020  ? ESRD (end stage renal disease) ([place]) 06/04/2020  ? Acute on chronic diastolic (congestive) heart failure ([place]) 04/26/2020  ? Type 2 diabetes mellitus with stage 5 chronic kidney disease ([place]) 04/26/2020  ? Acute CHF (congestive heart failure) ([place]) 02/01/2020  ? S/P left TKA 07/01/2019  ? Status post total left knee replacement 07/01/2019  ? Hyperlipidemia 09/12/2016  ? Type 2 diabetes mellitus without complications ([place]) 80/22/3361  ? Tachycardia 08/26/2016  ? Palpitation 08/26/2016  ? SOB (shortness of breath)  08/26/2016  ? Chronic kidney disease, stage V ([place]) 04/28/2014  ? Chronic diastolic heart failure ([place]) 05/05/2013  ? Essential hypertension 05/05/2013  ? HEART MURMUR, SYSTOLIC 22/44/9753  ? Hypothyroidism 02/12/2007  ? OBESITY 12/

## 2021-04-30 NOTE — Progress Notes (Signed)
Pt state lower back pain that travels to her buttocks and down both legs. Pt state the pain travels dow the posterior of her legs.Pt state sitting makes the pain worse. Pt state she takes over the counter pain meds pain to help ease her pain. ? ?Numeric Pain Rating Scale and Functional Assessment ?Average Pain 0 ?Pain Right Now 8 ?My pain is constant, burning, and tingling ?Pain is worse with: sitting ?Pain improves with: medication ? ? ?In the last MONTH (on 0-10 scale) has pain interfered with the following? ? ?1. General activity like being  able to carry out your everyday physical activities such as walking, climbing stairs, carrying groceries, or moving a chair?  ?Rating(5) ? ?2. Relation with others like being able to carry out your usual social activities and roles such as  activities at home, at work and in your community. ?Rating(6) ? ?3. Enjoyment of life such that you have  been bothered by emotional problems such as feeling anxious, depressed or irritable?  ?Rating(7) ? ?

## 2021-05-04 IMAGING — DX DG ABDOMEN ACUTE W/ 1V CHEST
4 series · 4 of 4 positions shown · non-contrast
Comparison: Chest x-rays dated 04/26/2020, 04/17/2020 and
01/12/2016.

CLINICAL DATA: Pain, vomiting.

EXAM:
DG ABDOMEN ACUTE WITH 1 VIEW CHEST

[w chest pa]
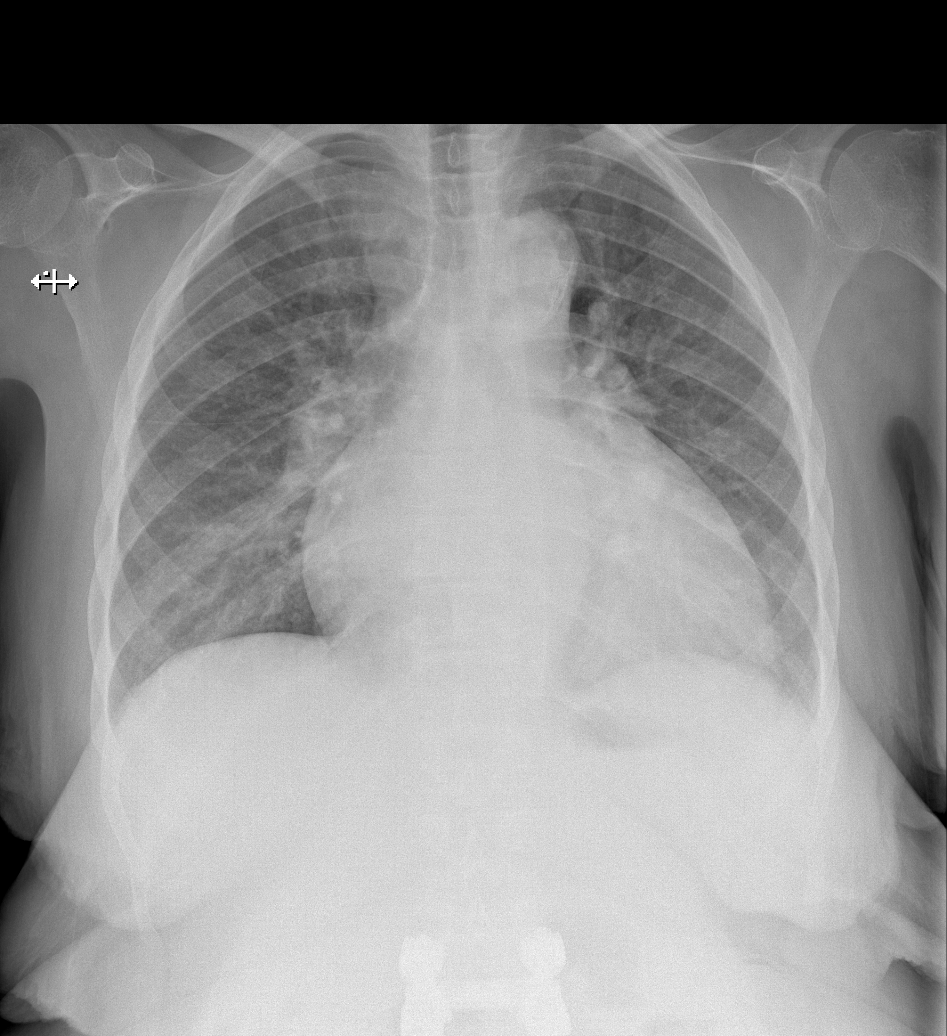

[w abdomen upright]
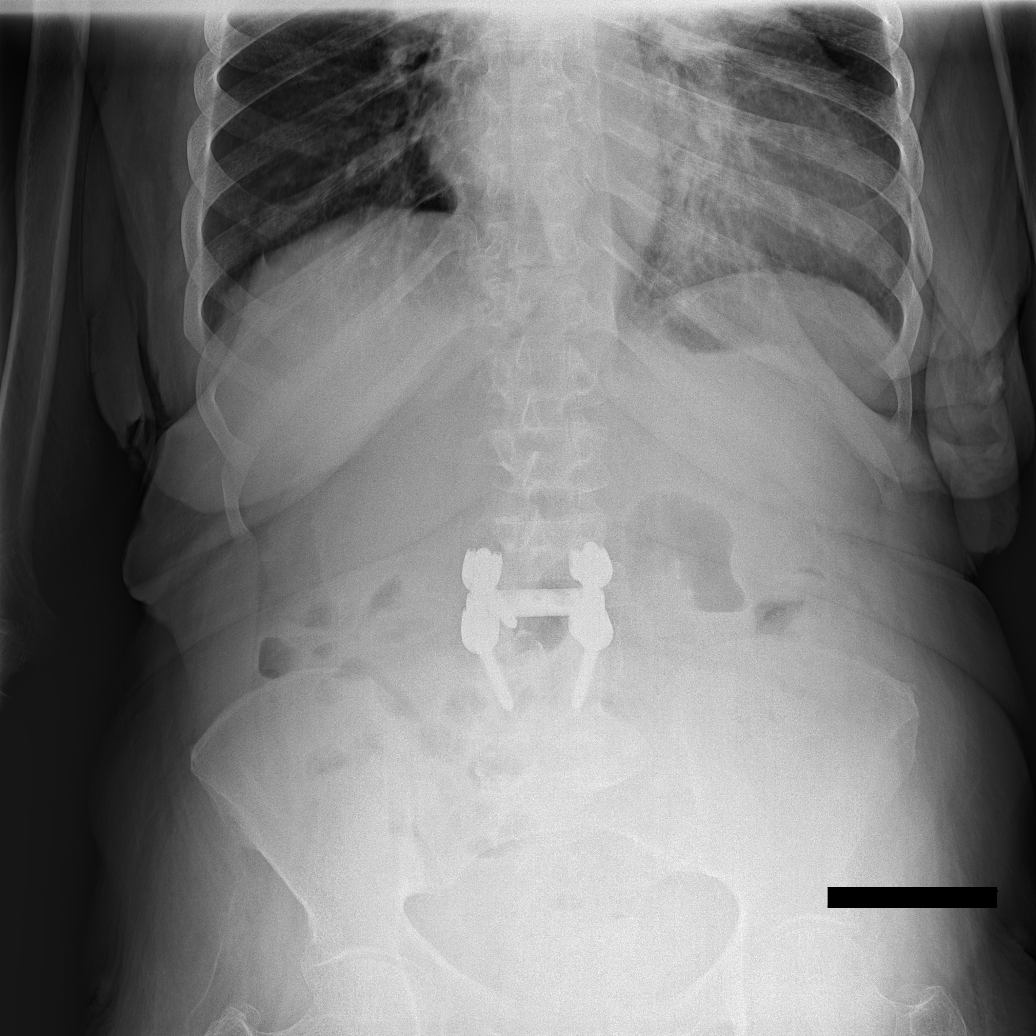

[t abdomen supine (1 of 2)]
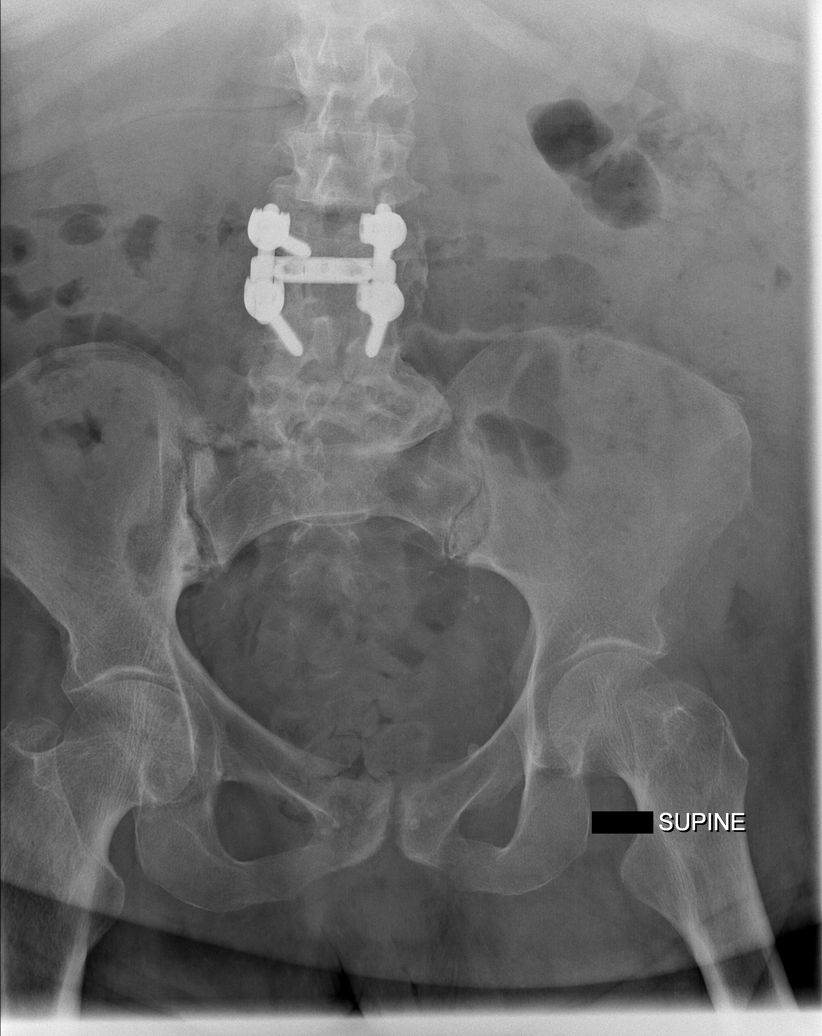

[t abdomen supine (2 of 2)]
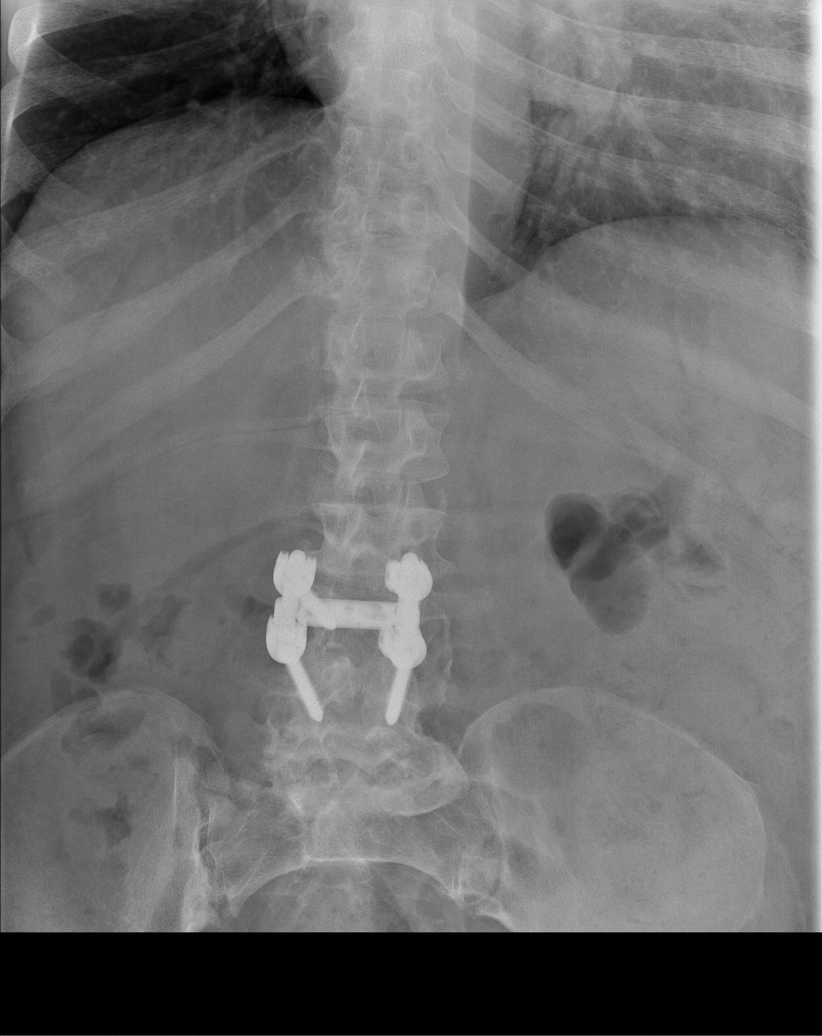

[4 of 4 positions shown; findings below may reference images not displayed]

FINDINGS: Single-view of the chest: Stable cardiomegaly. Central pulmonary
vascular congestion and mild bilateral interstitial prominence,
presumably interstitial edema. No confluent opacity to suggest a
consolidating pneumonia. No pleural effusion or pneumothorax is
seen.

Supine and upright views of the abdomen: No dilated large or small
bowel loops. No evidence of free intraperitoneal air. No evidence of
renal or ureteral calculi. No acute appearing osseous abnormality.
Degenerative spondylosis of lumbosacral spine, with overlying
fixation hardware in place.
IMPRESSION: 1. Cardiomegaly with central pulmonary vascular congestion and mild
bilateral interstitial edema, compatible with CHF/volume overload,
likely chronic.
2. Nonobstructive bowel gas pattern and no evidence of acute
intra-abdominal abnormality.

## 2021-05-05 DIAGNOSIS — I219 Acute myocardial infarction, unspecified: Secondary | ICD-10-CM

## 2021-05-05 HISTORY — DX: Acute myocardial infarction, unspecified: I21.9

## 2021-05-11 ENCOUNTER — Inpatient Hospital Stay (HOSPITAL_COMMUNITY)
Admission: EM | Admit: 2021-05-11 | Discharge: 2021-05-20 | DRG: 247 | Disposition: A | Discharge status: Home / Self Care | Payer: Medicare Other | Attending: Cardiology | Admitting: Cardiology

## 2021-05-11 ENCOUNTER — Other Ambulatory Visit: Payer: Self-pay

## 2021-05-11 ENCOUNTER — Emergency Department (HOSPITAL_COMMUNITY): Payer: Medicare Other

## 2021-05-11 ENCOUNTER — Encounter (HOSPITAL_COMMUNITY): Payer: Self-pay | Admitting: Emergency Medicine

## 2021-05-11 DIAGNOSIS — E785 Hyperlipidemia, unspecified: Secondary | ICD-10-CM | POA: Diagnosis present

## 2021-05-11 DIAGNOSIS — I132 Hypertensive heart and chronic kidney disease with heart failure and with stage 5 chronic kidney disease, or end stage renal disease: Secondary | ICD-10-CM | POA: Diagnosis present

## 2021-05-11 DIAGNOSIS — G4733 Obstructive sleep apnea (adult) (pediatric): Secondary | ICD-10-CM | POA: Diagnosis present

## 2021-05-11 DIAGNOSIS — Z7951 Long term (current) use of inhaled steroids: Secondary | ICD-10-CM

## 2021-05-11 DIAGNOSIS — D631 Anemia in chronic kidney disease: Secondary | ICD-10-CM | POA: Diagnosis present

## 2021-05-11 DIAGNOSIS — I2510 Atherosclerotic heart disease of native coronary artery without angina pectoris: Secondary | ICD-10-CM | POA: Diagnosis present

## 2021-05-11 DIAGNOSIS — Z90711 Acquired absence of uterus with remaining cervical stump: Secondary | ICD-10-CM

## 2021-05-11 DIAGNOSIS — R42 Dizziness and giddiness: Secondary | ICD-10-CM | POA: Diagnosis not present

## 2021-05-11 DIAGNOSIS — I2119 ST elevation (STEMI) myocardial infarction involving other coronary artery of inferior wall: Secondary | ICD-10-CM | POA: Diagnosis not present

## 2021-05-11 DIAGNOSIS — Z79899 Other long term (current) drug therapy: Secondary | ICD-10-CM

## 2021-05-11 DIAGNOSIS — E1165 Type 2 diabetes mellitus with hyperglycemia: Secondary | ICD-10-CM | POA: Diagnosis not present

## 2021-05-11 DIAGNOSIS — E669 Obesity, unspecified: Secondary | ICD-10-CM | POA: Diagnosis present

## 2021-05-11 DIAGNOSIS — Z7982 Long term (current) use of aspirin: Secondary | ICD-10-CM

## 2021-05-11 DIAGNOSIS — E1122 Type 2 diabetes mellitus with diabetic chronic kidney disease: Secondary | ICD-10-CM | POA: Diagnosis present

## 2021-05-11 DIAGNOSIS — I5032 Chronic diastolic (congestive) heart failure: Secondary | ICD-10-CM | POA: Diagnosis present

## 2021-05-11 DIAGNOSIS — Z8616 Personal history of COVID-19: Secondary | ICD-10-CM

## 2021-05-11 DIAGNOSIS — I213 ST elevation (STEMI) myocardial infarction of unspecified site: Secondary | ICD-10-CM | POA: Diagnosis present

## 2021-05-11 DIAGNOSIS — Z833 Family history of diabetes mellitus: Secondary | ICD-10-CM

## 2021-05-11 DIAGNOSIS — N185 Chronic kidney disease, stage 5: Secondary | ICD-10-CM

## 2021-05-11 DIAGNOSIS — N186 End stage renal disease: Secondary | ICD-10-CM | POA: Diagnosis present

## 2021-05-11 DIAGNOSIS — D696 Thrombocytopenia, unspecified: Secondary | ICD-10-CM | POA: Diagnosis not present

## 2021-05-11 DIAGNOSIS — Z20822 Contact with and (suspected) exposure to covid-19: Secondary | ICD-10-CM | POA: Diagnosis present

## 2021-05-11 DIAGNOSIS — Z79890 Hormone replacement therapy (postmenopausal): Secondary | ICD-10-CM

## 2021-05-11 DIAGNOSIS — Z6835 Body mass index (BMI) 35.0-35.9, adult: Secondary | ICD-10-CM

## 2021-05-11 DIAGNOSIS — Z955 Presence of coronary angioplasty implant and graft: Secondary | ICD-10-CM

## 2021-05-11 DIAGNOSIS — Z881 Allergy status to other antibiotic agents status: Secondary | ICD-10-CM

## 2021-05-11 DIAGNOSIS — I2111 ST elevation (STEMI) myocardial infarction involving right coronary artery: Principal | ICD-10-CM

## 2021-05-11 DIAGNOSIS — E119 Type 2 diabetes mellitus without complications: Secondary | ICD-10-CM

## 2021-05-11 DIAGNOSIS — Z66 Do not resuscitate: Secondary | ICD-10-CM | POA: Diagnosis present

## 2021-05-11 DIAGNOSIS — Z8042 Family history of malignant neoplasm of prostate: Secondary | ICD-10-CM

## 2021-05-11 DIAGNOSIS — Z8249 Family history of ischemic heart disease and other diseases of the circulatory system: Secondary | ICD-10-CM

## 2021-05-11 DIAGNOSIS — E872 Acidosis, unspecified: Secondary | ICD-10-CM | POA: Diagnosis not present

## 2021-05-11 DIAGNOSIS — I10 Essential (primary) hypertension: Secondary | ICD-10-CM | POA: Diagnosis present

## 2021-05-11 DIAGNOSIS — E039 Hypothyroidism, unspecified: Secondary | ICD-10-CM | POA: Diagnosis present

## 2021-05-11 DIAGNOSIS — I472 Ventricular tachycardia, unspecified: Secondary | ICD-10-CM | POA: Diagnosis not present

## 2021-05-11 DIAGNOSIS — I080 Rheumatic disorders of both mitral and aortic valves: Secondary | ICD-10-CM | POA: Diagnosis present

## 2021-05-11 DIAGNOSIS — Z794 Long term (current) use of insulin: Secondary | ICD-10-CM

## 2021-05-11 DIAGNOSIS — Z96652 Presence of left artificial knee joint: Secondary | ICD-10-CM | POA: Diagnosis present

## 2021-05-11 LAB — HEMOGLOBIN A1C
Hgb A1c MFr Bld: 6.7 % — ABNORMAL HIGH (ref 4.8–5.6)
Mean Plasma Glucose: 145.59 mg/dL

## 2021-05-11 MED ORDER — SODIUM CHLORIDE 0.9 % IV SOLN: INTRAVENOUS | Status: DC

## 2021-05-11 MED ORDER — ASPIRIN 81 MG PO CHEW: 324.0000 mg | CHEWABLE_TABLET | Freq: Once | ORAL | Status: DC

## 2021-05-11 MED ORDER — HEPARIN SODIUM (PORCINE) 5000 UNIT/ML IJ SOLN
4000.0000 [IU] | Freq: Once | INTRAMUSCULAR | Status: AC
  Administered 2021-05-11: 4000 [IU] via INTRAVENOUS
  Filled 2021-05-11: qty 1

## 2021-05-11 MED ORDER — NITROGLYCERIN IN D5W 200-5 MCG/ML-% IV SOLN
0.0000 ug/min | INTRAVENOUS | Status: DC
  Administered 2021-05-11: 5 ug/min via INTRAVENOUS
  Filled 2021-05-11: qty 250

## 2021-05-11 NOTE — ED Triage Notes (Signed)
Pt arrive by EMS from home with c/o mid cp radiating to left arm and vomiting. 2 nitros given by EMS with no relief and 324 mg asa given pta by ems. ?

## 2021-05-12 ENCOUNTER — Encounter (HOSPITAL_COMMUNITY)
Admission: EM | Disposition: A | Discharge status: Home / Self Care | Payer: Medicare Other | Source: Home / Self Care | Attending: Internal Medicine

## 2021-05-12 ENCOUNTER — Encounter (HOSPITAL_COMMUNITY): Payer: Self-pay | Admitting: Internal Medicine

## 2021-05-12 ENCOUNTER — Inpatient Hospital Stay (HOSPITAL_COMMUNITY): Payer: Medicare Other

## 2021-05-12 DIAGNOSIS — E669 Obesity, unspecified: Secondary | ICD-10-CM | POA: Diagnosis present

## 2021-05-12 DIAGNOSIS — Z7951 Long term (current) use of inhaled steroids: Secondary | ICD-10-CM | POA: Diagnosis not present

## 2021-05-12 DIAGNOSIS — Z79899 Other long term (current) drug therapy: Secondary | ICD-10-CM | POA: Diagnosis not present

## 2021-05-12 DIAGNOSIS — I2111 ST elevation (STEMI) myocardial infarction involving right coronary artery: Secondary | ICD-10-CM | POA: Diagnosis present

## 2021-05-12 DIAGNOSIS — I213 ST elevation (STEMI) myocardial infarction of unspecified site: Secondary | ICD-10-CM

## 2021-05-12 DIAGNOSIS — I5032 Chronic diastolic (congestive) heart failure: Secondary | ICD-10-CM

## 2021-05-12 DIAGNOSIS — G4733 Obstructive sleep apnea (adult) (pediatric): Secondary | ICD-10-CM | POA: Diagnosis present

## 2021-05-12 DIAGNOSIS — D696 Thrombocytopenia, unspecified: Secondary | ICD-10-CM | POA: Diagnosis not present

## 2021-05-12 DIAGNOSIS — E785 Hyperlipidemia, unspecified: Secondary | ICD-10-CM | POA: Diagnosis present

## 2021-05-12 DIAGNOSIS — Z7982 Long term (current) use of aspirin: Secondary | ICD-10-CM | POA: Diagnosis not present

## 2021-05-12 DIAGNOSIS — Z8616 Personal history of COVID-19: Secondary | ICD-10-CM | POA: Diagnosis not present

## 2021-05-12 DIAGNOSIS — Z794 Long term (current) use of insulin: Secondary | ICD-10-CM | POA: Diagnosis not present

## 2021-05-12 DIAGNOSIS — R079 Chest pain, unspecified: Secondary | ICD-10-CM

## 2021-05-12 DIAGNOSIS — I080 Rheumatic disorders of both mitral and aortic valves: Secondary | ICD-10-CM | POA: Diagnosis present

## 2021-05-12 DIAGNOSIS — I2510 Atherosclerotic heart disease of native coronary artery without angina pectoris: Secondary | ICD-10-CM

## 2021-05-12 DIAGNOSIS — Z90711 Acquired absence of uterus with remaining cervical stump: Secondary | ICD-10-CM | POA: Diagnosis not present

## 2021-05-12 DIAGNOSIS — E872 Acidosis, unspecified: Secondary | ICD-10-CM | POA: Diagnosis not present

## 2021-05-12 DIAGNOSIS — D631 Anemia in chronic kidney disease: Secondary | ICD-10-CM | POA: Diagnosis present

## 2021-05-12 DIAGNOSIS — N186 End stage renal disease: Secondary | ICD-10-CM | POA: Diagnosis not present

## 2021-05-12 DIAGNOSIS — I132 Hypertensive heart and chronic kidney disease with heart failure and with stage 5 chronic kidney disease, or end stage renal disease: Secondary | ICD-10-CM | POA: Diagnosis present

## 2021-05-12 DIAGNOSIS — N185 Chronic kidney disease, stage 5: Secondary | ICD-10-CM | POA: Diagnosis present

## 2021-05-12 DIAGNOSIS — Z20822 Contact with and (suspected) exposure to covid-19: Secondary | ICD-10-CM | POA: Diagnosis present

## 2021-05-12 DIAGNOSIS — E039 Hypothyroidism, unspecified: Secondary | ICD-10-CM | POA: Diagnosis present

## 2021-05-12 DIAGNOSIS — I10 Essential (primary) hypertension: Secondary | ICD-10-CM | POA: Diagnosis not present

## 2021-05-12 DIAGNOSIS — Z66 Do not resuscitate: Secondary | ICD-10-CM | POA: Diagnosis present

## 2021-05-12 DIAGNOSIS — I472 Ventricular tachycardia, unspecified: Secondary | ICD-10-CM | POA: Diagnosis not present

## 2021-05-12 DIAGNOSIS — E1122 Type 2 diabetes mellitus with diabetic chronic kidney disease: Secondary | ICD-10-CM | POA: Diagnosis present

## 2021-05-12 DIAGNOSIS — Z96652 Presence of left artificial knee joint: Secondary | ICD-10-CM | POA: Diagnosis present

## 2021-05-12 DIAGNOSIS — I2119 ST elevation (STEMI) myocardial infarction involving other coronary artery of inferior wall: Secondary | ICD-10-CM | POA: Diagnosis present

## 2021-05-12 HISTORY — PX: CORONARY STENT INTERVENTION: CATH118234

## 2021-05-12 HISTORY — PX: CORONARY/GRAFT ACUTE MI REVASCULARIZATION: CATH118305

## 2021-05-12 HISTORY — PX: LEFT HEART CATH AND CORONARY ANGIOGRAPHY: CATH118249

## 2021-05-12 LAB — ECHOCARDIOGRAM COMPLETE
AR max vel: 2.01 cm2
AV Area VTI: 2.04 cm2
AV Area mean vel: 2.23 cm2
AV Mean grad: 11 mmHg
AV Peak grad: 22.3 mmHg
Ao pk vel: 2.36 m/s
Area-P 1/2: 1.55 cm2
Height: 69 in
MV VTI: 1.59 cm2
S' Lateral: 2.1 cm
Weight: 3425.07 oz

## 2021-05-12 LAB — MAGNESIUM: Magnesium: 2.2 mg/dL (ref 1.7–2.4)

## 2021-05-12 LAB — CBC WITH DIFFERENTIAL/PLATELET
Abs Immature Granulocytes: 0.01 10*3/uL (ref 0.00–0.07)
Basophils Absolute: 0 10*3/uL (ref 0.0–0.1)
Basophils Relative: 0 %
Eosinophils Absolute: 0.1 10*3/uL (ref 0.0–0.5)
Eosinophils Relative: 3 %
HCT: 32.5 % — ABNORMAL LOW (ref 36.0–46.0)
Hemoglobin: 9.8 g/dL — ABNORMAL LOW (ref 12.0–15.0)
Immature Granulocytes: 0 %
Lymphocytes Relative: 32 %
Lymphs Abs: 1.6 10*3/uL (ref 0.7–4.0)
MCH: 25.9 pg — ABNORMAL LOW (ref 26.0–34.0)
MCHC: 30.2 g/dL (ref 30.0–36.0)
MCV: 86 fL (ref 80.0–100.0)
Monocytes Absolute: 0.7 10*3/uL (ref 0.1–1.0)
Monocytes Relative: 14 %
Neutro Abs: 2.5 10*3/uL (ref 1.7–7.7)
Neutrophils Relative %: 51 %
Platelets: 108 10*3/uL — ABNORMAL LOW (ref 150–400)
RBC: 3.78 MIL/uL — ABNORMAL LOW (ref 3.87–5.11)
RDW: 16 % — ABNORMAL HIGH (ref 11.5–15.5)
WBC: 5 10*3/uL (ref 4.0–10.5)
nRBC: 0 % (ref 0.0–0.2)

## 2021-05-12 LAB — APTT: aPTT: 34 seconds (ref 24–36)

## 2021-05-12 LAB — TROPONIN I (HIGH SENSITIVITY)
Troponin I (High Sensitivity): 169 ng/L (ref <18)
Troponin I (High Sensitivity): 876 ng/L (ref <18)

## 2021-05-12 LAB — COMPREHENSIVE METABOLIC PANEL
ALT: 25 U/L (ref 0–44)
AST: 46 U/L — ABNORMAL HIGH (ref 15–41)
Albumin: 3.1 g/dL — ABNORMAL LOW (ref 3.5–5.0)
Alkaline Phosphatase: 65 U/L (ref 38–126)
Anion gap: 11 (ref 5–15)
BUN: 100 mg/dL — ABNORMAL HIGH (ref 8–23)
CO2: 22 mmol/L (ref 22–32)
Calcium: 9.7 mg/dL (ref 8.9–10.3)
Chloride: 110 mmol/L (ref 98–111)
Creatinine, Ser: 4.21 mg/dL — ABNORMAL HIGH (ref 0.44–1.00)
GFR, Estimated: 11 mL/min — ABNORMAL LOW (ref >60)
Glucose, Bld: 189 mg/dL — ABNORMAL HIGH (ref 70–99)
Potassium: 4.4 mmol/L (ref 3.5–5.1)
Sodium: 143 mmol/L (ref 135–145)
Total Bilirubin: 0.7 mg/dL (ref 0.3–1.2)
Total Protein: 5.9 g/dL — ABNORMAL LOW (ref 6.5–8.1)

## 2021-05-12 LAB — CBC
HCT: 32.8 % — ABNORMAL LOW (ref 36.0–46.0)
HCT: 29.4 % — ABNORMAL LOW (ref 36.0–46.0)
Hemoglobin: 9.9 g/dL — ABNORMAL LOW (ref 12.0–15.0)
Hemoglobin: 9.2 g/dL — ABNORMAL LOW (ref 12.0–15.0)
MCH: 26.5 pg (ref 26.0–34.0)
MCH: 26.2 pg (ref 26.0–34.0)
MCHC: 30.2 g/dL (ref 30.0–36.0)
MCHC: 31.3 g/dL (ref 30.0–36.0)
MCV: 87.7 fL (ref 80.0–100.0)
MCV: 83.8 fL (ref 80.0–100.0)
Platelets: 115 10*3/uL — ABNORMAL LOW (ref 150–400)
Platelets: UNDETERMINED 10*3/uL (ref 150–400)
RBC: 3.74 MIL/uL — ABNORMAL LOW (ref 3.87–5.11)
RBC: 3.51 MIL/uL — ABNORMAL LOW (ref 3.87–5.11)
RDW: 17.4 % — ABNORMAL HIGH (ref 11.5–15.5)
RDW: 16 % — ABNORMAL HIGH (ref 11.5–15.5)
WBC: 6.1 10*3/uL (ref 4.0–10.5)
WBC: 5.6 10*3/uL (ref 4.0–10.5)
nRBC: 0 % (ref 0.0–0.2)
nRBC: 0 % (ref 0.0–0.2)

## 2021-05-12 LAB — LIPID PANEL
Cholesterol: 151 mg/dL (ref 0–200)
HDL: 45 mg/dL (ref >40)
LDL Cholesterol: 96 mg/dL (ref 0–99)
Total CHOL/HDL Ratio: 3.4 RATIO
Triglycerides: 51 mg/dL (ref <150)
VLDL: 10 mg/dL (ref 0–40)

## 2021-05-12 LAB — BASIC METABOLIC PANEL
Anion gap: 10 (ref 5–15)
BUN: 102 mg/dL — ABNORMAL HIGH (ref 8–23)
CO2: 23 mmol/L (ref 22–32)
Calcium: 9.8 mg/dL (ref 8.9–10.3)
Chloride: 108 mmol/L (ref 98–111)
Creatinine, Ser: 4.54 mg/dL — ABNORMAL HIGH (ref 0.44–1.00)
GFR, Estimated: 10 mL/min — ABNORMAL LOW (ref >60)
Glucose, Bld: 182 mg/dL — ABNORMAL HIGH (ref 70–99)
Potassium: 4.6 mmol/L (ref 3.5–5.1)
Sodium: 141 mmol/L (ref 135–145)

## 2021-05-12 LAB — GLUCOSE, CAPILLARY
Glucose-Capillary: 213 mg/dL — ABNORMAL HIGH (ref 70–99)
Glucose-Capillary: 175 mg/dL — ABNORMAL HIGH (ref 70–99)
Glucose-Capillary: 144 mg/dL — ABNORMAL HIGH (ref 70–99)
Glucose-Capillary: 138 mg/dL — ABNORMAL HIGH (ref 70–99)

## 2021-05-12 LAB — PROTIME-INR
INR: 1.1 (ref 0.8–1.2)
Prothrombin Time: 14 seconds (ref 11.4–15.2)

## 2021-05-12 LAB — RESP PANEL BY RT-PCR (FLU A&B, COVID) ARPGX2
Influenza A by PCR: NEGATIVE
Influenza B by PCR: NEGATIVE
SARS Coronavirus 2 by RT PCR: NEGATIVE

## 2021-05-12 LAB — TSH: TSH: 0.297 u[IU]/mL — ABNORMAL LOW (ref 0.350–4.500)

## 2021-05-12 LAB — HIV ANTIBODY (ROUTINE TESTING W REFLEX): HIV Screen 4th Generation wRfx: NONREACTIVE

## 2021-05-12 LAB — MRSA NEXT GEN BY PCR, NASAL: MRSA by PCR Next Gen: NOT DETECTED

## 2021-05-12 SURGERY — CORONARY/GRAFT ACUTE MI REVASCULARIZATION
Anesthesia: LOCAL

## 2021-05-12 MED ORDER — LIDOCAINE HCL (PF) 1 % IJ SOLN
INTRAMUSCULAR | Status: DC | PRN
Start: 2021-05-12 — End: 2021-05-12
  Administered 2021-05-12: 2 mL

## 2021-05-12 MED ORDER — HEPARIN (PORCINE) IN NACL 1000-0.9 UT/500ML-% IV SOLN
INTRAVENOUS | Status: AC
  Filled 2021-05-12: qty 500

## 2021-05-12 MED ORDER — MIDAZOLAM HCL 2 MG/2ML IJ SOLN
INTRAMUSCULAR | Status: AC
  Filled 2021-05-12: qty 2

## 2021-05-12 MED ORDER — ASPIRIN 81 MG PO CHEW: 81.0000 mg | CHEWABLE_TABLET | Freq: Every day | ORAL | Status: DC

## 2021-05-12 MED ORDER — ALPRAZOLAM 0.25 MG PO TABS
0.2500 mg | ORAL_TABLET | Freq: Once | ORAL | Status: AC
Start: 2021-05-12 — End: 2021-05-12
  Administered 2021-05-12: 0.25 mg via ORAL
  Filled 2021-05-12: qty 1

## 2021-05-12 MED ORDER — ATORVASTATIN CALCIUM 80 MG PO TABS
80.0000 mg | ORAL_TABLET | Freq: Every day | ORAL | Status: DC
Start: 2021-05-12 — End: 2021-05-20
  Administered 2021-05-12 – 2021-05-19 (×9): 80 mg via ORAL
  Filled 2021-05-12 (×9): qty 1

## 2021-05-12 MED ORDER — ONDANSETRON HCL 4 MG/2ML IJ SOLN: 4.0000 mg | Freq: Four times a day (QID) | INTRAMUSCULAR | Status: DC | PRN

## 2021-05-12 MED ORDER — NITROGLYCERIN 0.4 MG SL SUBL: 0.4000 mg | SUBLINGUAL_TABLET | SUBLINGUAL | Status: DC | PRN

## 2021-05-12 MED ORDER — SODIUM CHLORIDE 0.9 % IV SOLN: INTRAVENOUS | Status: AC

## 2021-05-12 MED ORDER — TIROFIBAN HCL IN NACL 5-0.9 MG/100ML-% IV SOLN
INTRAVENOUS | Status: AC | PRN
  Administered 2021-05-12: .075 ug/kg/min via INTRAVENOUS

## 2021-05-12 MED ORDER — FENTANYL CITRATE (PF) 100 MCG/2ML IJ SOLN
INTRAMUSCULAR | Status: AC
  Filled 2021-05-12: qty 2

## 2021-05-12 MED ORDER — MIDAZOLAM HCL 2 MG/2ML IJ SOLN
INTRAMUSCULAR | Status: DC | PRN
  Administered 2021-05-12: 1 mg via INTRAVENOUS

## 2021-05-12 MED ORDER — HEPARIN SODIUM (PORCINE) 1000 UNIT/ML IJ SOLN
INTRAMUSCULAR | Status: DC | PRN
  Administered 2021-05-12: 5000 [IU] via INTRAVENOUS
  Administered 2021-05-12: 1000 [IU] via INTRAVENOUS

## 2021-05-12 MED ORDER — CARVEDILOL 12.5 MG PO TABS: 12.5000 mg | ORAL_TABLET | Freq: Two times a day (BID) | ORAL | Status: DC

## 2021-05-12 MED ORDER — SODIUM CHLORIDE 0.9 % IV SOLN
250.0000 mL | INTRAVENOUS | Status: DC | PRN
  Administered 2021-05-18: 250 mL via INTRAVENOUS

## 2021-05-12 MED ORDER — VERAPAMIL HCL 2.5 MG/ML IV SOLN
INTRAVENOUS | Status: AC
  Filled 2021-05-12: qty 2

## 2021-05-12 MED ORDER — SODIUM CHLORIDE 0.9% FLUSH: 3.0000 mL | INTRAVENOUS | Status: DC | PRN

## 2021-05-12 MED ORDER — ASPIRIN EC 81 MG PO TBEC: 81.0000 mg | DELAYED_RELEASE_TABLET | Freq: Every day | ORAL | Status: DC

## 2021-05-12 MED ORDER — CALCITRIOL 0.5 MCG PO CAPS
0.5000 ug | ORAL_CAPSULE | Freq: Every day | ORAL | Status: DC
  Administered 2021-05-12 – 2021-05-20 (×9): 0.5 ug via ORAL
  Filled 2021-05-12 (×9): qty 1

## 2021-05-12 MED ORDER — HEPARIN SODIUM (PORCINE) 1000 UNIT/ML IJ SOLN
INTRAMUSCULAR | Status: AC
  Filled 2021-05-12: qty 10

## 2021-05-12 MED ORDER — POLYVINYL ALCOHOL 1.4 % OP SOLN
1.0000 [drp] | Freq: Every day | OPHTHALMIC | Status: DC
  Administered 2021-05-13 – 2021-05-20 (×8): 1 [drp] via OPHTHALMIC
  Filled 2021-05-12: qty 15

## 2021-05-12 MED ORDER — HEPARIN (PORCINE) IN NACL 1000-0.9 UT/500ML-% IV SOLN
INTRAVENOUS | Status: DC | PRN
  Administered 2021-05-12 (×2): 500 mL

## 2021-05-12 MED ORDER — VERAPAMIL HCL 2.5 MG/ML IV SOLN
INTRAVENOUS | Status: DC | PRN
  Administered 2021-05-12: 10 mL via INTRA_ARTERIAL

## 2021-05-12 MED ORDER — SODIUM CHLORIDE 0.9 % IV SOLN: INTRAVENOUS | Status: DC

## 2021-05-12 MED ORDER — INSULIN ASPART 100 UNIT/ML IJ SOLN
0.0000 [IU] | Freq: Three times a day (TID) | INTRAMUSCULAR | Status: DC
  Administered 2021-05-12: 3 [IU] via SUBCUTANEOUS
  Administered 2021-05-12 – 2021-05-13 (×2): 2 [IU] via SUBCUTANEOUS
  Administered 2021-05-13 – 2021-05-14 (×3): 3 [IU] via SUBCUTANEOUS
  Administered 2021-05-14 – 2021-05-15 (×2): 2 [IU] via SUBCUTANEOUS
  Administered 2021-05-15: 3 [IU] via SUBCUTANEOUS
  Administered 2021-05-15 – 2021-05-16 (×3): 2 [IU] via SUBCUTANEOUS
  Administered 2021-05-17 (×2): 3 [IU] via SUBCUTANEOUS
  Administered 2021-05-18 (×2): 2 [IU] via SUBCUTANEOUS
  Administered 2021-05-19: 5 [IU] via SUBCUTANEOUS
  Administered 2021-05-19 – 2021-05-20 (×2): 2 [IU] via SUBCUTANEOUS

## 2021-05-12 MED ORDER — TICAGRELOR 90 MG PO TABS
ORAL_TABLET | ORAL | Status: DC | PRN
Start: 2021-05-12 — End: 2021-05-12
  Administered 2021-05-12: 180 mg via ORAL

## 2021-05-12 MED ORDER — NITROGLYCERIN 1 MG/10 ML FOR IR/CATH LAB
INTRA_ARTERIAL | Status: AC
  Filled 2021-05-12: qty 10

## 2021-05-12 MED ORDER — HYDRALAZINE HCL 50 MG PO TABS
50.0000 mg | ORAL_TABLET | Freq: Two times a day (BID) | ORAL | Status: DC
Start: 2021-05-12 — End: 2021-05-12

## 2021-05-12 MED ORDER — TIROFIBAN HCL IV 12.5 MG/250 ML
0.0750 ug/kg/min | INTRAVENOUS | Status: AC
  Administered 2021-05-12: 0.075 ug/kg/min via INTRAVENOUS
  Filled 2021-05-12: qty 250

## 2021-05-12 MED ORDER — IOHEXOL 350 MG/ML SOLN
INTRAVENOUS | Status: DC | PRN
  Administered 2021-05-12: 105 mL via INTRA_ARTERIAL

## 2021-05-12 MED ORDER — LEVOTHYROXINE SODIUM 112 MCG PO TABS
224.0000 ug | ORAL_TABLET | Freq: Every day | ORAL | Status: DC
  Administered 2021-05-12: 224 ug via ORAL
  Filled 2021-05-12: qty 2

## 2021-05-12 MED ORDER — HEPARIN SODIUM (PORCINE) 5000 UNIT/ML IJ SOLN
5000.0000 [IU] | Freq: Three times a day (TID) | INTRAMUSCULAR | Status: DC
  Administered 2021-05-12 – 2021-05-17 (×11): 5000 [IU] via SUBCUTANEOUS
  Filled 2021-05-12 (×12): qty 1

## 2021-05-12 MED ORDER — CARVEDILOL 25 MG PO TABS
25.0000 mg | ORAL_TABLET | Freq: Two times a day (BID) | ORAL | Status: DC
  Administered 2021-05-12 – 2021-05-20 (×16): 25 mg via ORAL
  Filled 2021-05-12 (×17): qty 1

## 2021-05-12 MED ORDER — LABETALOL HCL 5 MG/ML IV SOLN
10.0000 mg | INTRAVENOUS | Status: AC | PRN
  Administered 2021-05-12: 10 mg via INTRAVENOUS
  Filled 2021-05-12: qty 4

## 2021-05-12 MED ORDER — SODIUM CHLORIDE 0.9% FLUSH
3.0000 mL | Freq: Two times a day (BID) | INTRAVENOUS | Status: DC
  Administered 2021-05-12 – 2021-05-20 (×14): 3 mL via INTRAVENOUS

## 2021-05-12 MED ORDER — TICAGRELOR 90 MG PO TABS: 90.0000 mg | ORAL_TABLET | Freq: Two times a day (BID) | ORAL | Status: DC

## 2021-05-12 MED ORDER — HYDRALAZINE HCL 50 MG PO TABS
50.0000 mg | ORAL_TABLET | Freq: Three times a day (TID) | ORAL | Status: DC
  Administered 2021-05-12 – 2021-05-13 (×4): 50 mg via ORAL
  Filled 2021-05-12 (×4): qty 1

## 2021-05-12 MED ORDER — LEVOTHYROXINE SODIUM 75 MCG PO TABS
175.0000 ug | ORAL_TABLET | Freq: Every day | ORAL | Status: DC
  Administered 2021-05-12 – 2021-05-20 (×9): 175 ug via ORAL
  Filled 2021-05-12 (×9): qty 1

## 2021-05-12 MED ORDER — ACETAMINOPHEN 325 MG PO TABS
650.0000 mg | ORAL_TABLET | ORAL | Status: DC | PRN
  Administered 2021-05-12: 650 mg via ORAL
  Filled 2021-05-12: qty 2

## 2021-05-12 MED ORDER — HYDRALAZINE HCL 20 MG/ML IJ SOLN
10.0000 mg | INTRAMUSCULAR | Status: AC | PRN
  Administered 2021-05-12 (×4): 10 mg via INTRAVENOUS
  Filled 2021-05-12 (×2): qty 1

## 2021-05-12 MED ORDER — ACETAMINOPHEN 325 MG PO TABS: 650.0000 mg | ORAL_TABLET | ORAL | Status: DC | PRN

## 2021-05-12 MED ORDER — LIDOCAINE HCL (PF) 1 % IJ SOLN
INTRAMUSCULAR | Status: AC
  Filled 2021-05-12: qty 30

## 2021-05-12 MED ORDER — TIROFIBAN (AGGRASTAT) BOLUS VIA INFUSION
INTRAVENOUS | Status: DC | PRN
  Administered 2021-05-12: 2450 ug via INTRAVENOUS

## 2021-05-12 MED ORDER — ZOLPIDEM TARTRATE 5 MG PO TABS
5.0000 mg | ORAL_TABLET | Freq: Every evening | ORAL | Status: DC | PRN
Start: 2021-05-12 — End: 2021-05-20
  Administered 2021-05-12 – 2021-05-20 (×7): 5 mg via ORAL
  Filled 2021-05-12 (×7): qty 1

## 2021-05-12 MED ORDER — TICAGRELOR 90 MG PO TABS
90.0000 mg | ORAL_TABLET | Freq: Two times a day (BID) | ORAL | Status: DC
  Administered 2021-05-12 – 2021-05-19 (×16): 90 mg via ORAL
  Filled 2021-05-12 (×16): qty 1

## 2021-05-12 MED ORDER — CLONIDINE HCL 0.3 MG/24HR TD PTWK
0.3000 mg | MEDICATED_PATCH | TRANSDERMAL | Status: DC
  Administered 2021-05-12 – 2021-05-18 (×2): 0.3 mg via TRANSDERMAL
  Filled 2021-05-12 (×2): qty 1

## 2021-05-12 MED ORDER — FENTANYL CITRATE (PF) 100 MCG/2ML IJ SOLN
INTRAMUSCULAR | Status: DC | PRN
  Administered 2021-05-12: 25 ug via INTRAVENOUS

## 2021-05-12 MED ORDER — INSULIN ASPART 100 UNIT/ML IJ SOLN: 0.0000 [IU] | Freq: Every day | INTRAMUSCULAR | Status: DC

## 2021-05-12 MED ORDER — ASPIRIN 81 MG PO CHEW
81.0000 mg | CHEWABLE_TABLET | Freq: Every day | ORAL | Status: DC
  Administered 2021-05-12 – 2021-05-20 (×9): 81 mg via ORAL
  Filled 2021-05-12 (×9): qty 1

## 2021-05-12 SURGICAL SUPPLY — 24 items
BALL SAPPHIRE NC24 3.0X12 (BALLOONS) ×2
BALLN SAPPHIRE 2.5X12 (BALLOONS) ×2
BALLOON SAPPHIRE 2.5X12 (BALLOONS) IMPLANT
BALLOON SAPPHIRE NC24 3.0X12 (BALLOONS) IMPLANT
CATH DIAG 6FR PIGTAIL ANGLED (CATHETERS) ×1 IMPLANT
CATH GUIDEZILLA II 6F (CATHETERS) IMPLANT
CATH INFINITI 5FR ANG PIGTAIL (CATHETERS) ×1 IMPLANT
CATH INFINITI 6F FL3.5 (CATHETERS) ×1 IMPLANT
CATH LAUNCHER 6FR JR4 (CATHETERS) ×1 IMPLANT
CATHETER GUIDEZILLA II 6F (CATHETERS) ×2
DEVICE RAD COMP TR BAND LRG (VASCULAR PRODUCTS) ×1 IMPLANT
GLIDESHEATH SLEND SS 6F .021 (SHEATH) ×1 IMPLANT
GUIDEWIRE INQWIRE 1.5J.035X260 (WIRE) IMPLANT
GUIDEWIRE VAS SION BLUE 190 (WIRE) ×2 IMPLANT
INQWIRE 1.5J .035X260CM (WIRE) ×2
KIT ENCORE 26 ADVANTAGE (KITS) ×1 IMPLANT
KIT HEART LEFT (KITS) ×2 IMPLANT
PACK CARDIAC CATHETERIZATION (CUSTOM PROCEDURE TRAY) ×2 IMPLANT
STENT ONYX FRONTIER 2.5X12 (Permanent Stent) ×1 IMPLANT
STENT ONYX FRONTIER 3.0X12 (Permanent Stent) ×1 IMPLANT
STENT ONYX FRONTIER 3.0X15 (Permanent Stent) ×1 IMPLANT
TRANSDUCER W/STOPCOCK (MISCELLANEOUS) ×2 IMPLANT
TUBING CIL FLEX 10 FLL-RA (TUBING) ×2 IMPLANT
WIRE EMERALD 3MM-J .035X150CM (WIRE) ×1 IMPLANT

## 2021-05-12 NOTE — Consult Note (Signed)
Renal Service ?Consult Note ?[facility] ? ?Rivka Barbara ?05/12/2021 ?[name], MD ?Requesting Physician: Dr. Ali Lowe ? ?Reason for Consult: Renal failure ?HPI: The patient is a 71 y.o. year-old w/ hx of DJD, CHF, CKD, HL, HTN, hypothyroid, obeisty, OSA, Jehovah's witness, DM2 presented to ED last night w/ CP radiating to L arm assoc w/ N/V. Pt taken to cath lab early for STEMI. She had 100% distal RCA rx'd w/ DES. Pt has been reluctant to consider dialysis in the past but agreed to dialysis should she need it because of contrast w/ heart cath. Creat 4.5 on admission (baseline creat 3.8- 4.3 from last 6 mos). Asked to see for renal failure.  ? ?Pt seen in ICU. Good UOP overnight 975 cc.  Labs pending today. Pt w/o any leg or arm swelling, no orthopnea or SOB. No N/V , loss of appetite or severe fatigue. Pt is f/b Dr Johnney Ou.  ? ?ROS - denies CP, no joint pain, no HA, no blurry vision, no rash, no diarrhea, no nausea/ vomiting ? ? ?Past Medical History  ?Past Medical History:  ?Diagnosis Date  ? ALLERGIC RHINITIS   ? Arthritis   ? CHF (congestive heart failure) ([place])   ? CKD (chronic kidney disease)   ? COVID 01/21/2021  ? + HOME TEST  HAD COUGH CONGESTION AND MALAISE, OCC CONGESTION NOW  ? Heart murmur   ? at birth  ? Hyperlipidemia   ? Hypertension   ? Hypothyroid   ? Iron deficiency anemia   ? Obesity   ? OSA on CPAP   ? Refusal of blood transfusions as patient is Jehovah's Witness   ? Shingles 11/2020  ? LEFT BACK  ? Type 2 dm with insulin use   ? checks abg qday runs 125  ? Wears glasses   ? ?Past Surgical History  ?Past Surgical History:  ?Procedure Laterality Date  ? BREAST BIOPSY    ? left breast per pt; benign 20+ yrs ago  ? BREAST REDUCTION SURGERY  1982  ? COLONOSCOPY WITH PROPOFOL N/A 10/24/2020  ? Procedure: COLONOSCOPY WITH PROPOFOL;  Surgeon: Ronnette Juniper, MD;  Location: [facility];  Service: Gastroenterology;  Laterality: N/A;  ? KNEE SURGERY Bilateral   ? x 2  ? LUMBAR DISC  SURGERY    ? x 2  ? POLYPECTOMY  10/24/2020  ? Procedure: POLYPECTOMY;  Surgeon: Ronnette Juniper, MD;  Location: [facility];  Service: Gastroenterology;;  ? REDUCTION MAMMAPLASTY Bilateral 1985  ? TOE SURGERY    ? yrs ago per pt on 02-02-2021  ? TOTAL ABDOMINAL HYSTERECTOMY  1997  ? partial hysterectomy- still has ovaries  ? TOTAL KNEE ARTHROPLASTY Left 07/01/2019  ? Procedure: TOTAL KNEE ARTHROPLASTY;  Surgeon: Paralee Cancel, MD;  Location: [facility];  Service: Orthopedics;  Laterality: Left;  70 min ?NO BLOOD PRODUCTS!!  ? TREATMENT FISTULA ANAL    ? yrs ago per pt 0n 02-02-2021  ? TRIGGER FINGER RELEASE Left 02/08/2021  ? Procedure: Left Ring trigger finger release, left ring finger volar ganglion cyst excision;  Surgeon: Orene Desanctis, MD;  Location: [facility];  Service: Orthopedics;  Laterality: Left;  with local anesthesia  ? ?Family History  ?Family History  ?Problem Relation Age of Onset  ? Prostate cancer Father   ? Congestive Heart Failure Mother   ? Diabetes Sister   ? Other Sister   ?     septis  ? Breast cancer Neg Hx   ? ?Social History  reports that she has never smoked. She has never used smokeless tobacco. She reports that she does not currently use alcohol. She reports that she does not use drugs. ?Allergies  ?Allergies  ?Allergen Reactions  ? Other   ?  No blood products  ? Sulfa Antibiotics Rash  ? ?Home medications ?Prior to Admission medications   ?Medication Sig Start Date End Date Taking? Authorizing Provider  ?amLODipine (NORVASC) 10 MG tablet Take 10 mg by mouth 2 (two) times daily.   Yes [provider]  ?aspirin EC 81 MG tablet Take 81 mg by mouth every evening. Swallow whole.   Yes [provider]  ?B Complex-C (B-COMPLEX WITH VITAMIN C) tablet Take 1 tablet by mouth in the morning.   Yes [provider]  ?calcitRIOL (ROCALTROL) 0.5 MCG capsule Take 0.5 mcg by mouth in the morning. 08/03/20  Yes [provider]  ?carvedilol (COREG) 12.5 MG  tablet Take 12.5 mg by mouth 2 (two) times daily. 09/15/20  Yes [provider]  ?cloNIDine (CATAPRES - DOSED IN MG/24 HR) 0.3 mg/24hr patch Place 0.3 mg onto the skin every Friday.   Yes [provider]  ?hydrALAZINE (APRESOLINE) 50 MG tablet Take 50 mg by mouth 2 (two) times daily. 08/10/20  Yes [provider]  ?levothyroxine (SYNTHROID) 112 MCG tablet Take 224 mcg by mouth daily before breakfast.   Yes [provider]  ?Multiple Vitamins-Minerals (PRESERVISION AREDS 2 PO) Take 1 tablet by mouth in the morning.   Yes [provider]  ?Omega-3 Fatty Acids (FISH OIL) 500 MG CAPS Take 1 capsule by mouth at bedtime.   Yes [provider]  ?Propylene Glycol (SYSTANE COMPLETE) 0.6 % SOLN Place 1 drop into both eyes in the morning, at noon, in the evening, and at bedtime.   Yes [provider]  ?torsemide (DEMADEX) 100 MG tablet Take 100-150 mg by mouth See admin instructions. Take 1 tablet (100 mg) by mouth every other day alternating with taking 1.5 tablets (150 mg) by mouth every other day.   Yes [provider]  ?zolpidem (AMBIEN) 10 MG tablet Take 10 mg by mouth at bedtime.   Yes [provider]  ?albuterol (VENTOLIN HFA) 108 (90 Base) MCG/ACT inhaler Inhale 2 puffs into the lungs every 6 (six) hours as needed for wheezing or shortness of breath.    [provider]  ?budesonide-formoterol (SYMBICORT) 160-4.5 MCG/ACT inhaler Inhale 2 puffs then rinse mouth, twice daily 09/25/20   Baird Lyons D, MD  ?cetirizine (ZYRTEC) 10 MG tablet Take 10 mg by mouth daily as needed for allergies.    [provider]  ?LANTUS SOLOSTAR 100 UNIT/ML Solostar Pen Inject 15 Units into the skin at bedtime. 08/08/20   [provider]  ? ? ? ?Vitals:  ? 05/12/21 0645 05/12/21 0700 05/12/21 0715 05/12/21 0737  ?BP: (!) 165/78 (!) 165/77 (!) 162/76   ?Pulse: 69 66 70   ?Resp: 16 18 18    ?Temp: (!) 97.5 ?F (36.4 ?C)   (!) 97.5 ?F (36.4 ?C)   ?TempSrc: Oral   Oral  ?SpO2: 99% 100% 100%   ?Weight:      ?Height:      ? ?Exam ?Gen alert, no distress, pleasant older adult AAF ?No rash, cyanosis or gangrene ?Sclera anicteric, throat clear  ?No jvd or bruits ?Chest clear bilat to bases, no rales/ wheezing ?RRR no RG ?Abd soft ntnd no mass or ascites +bs ?GU defer ?MS no joint effusions or deformity ?  Ext trace ankle edema bilat, no wounds or ulcers ?Neuro is alert, Ox 3 , nf, no asterixis ?   ? ? Home meds include - norvasc, symbicort, rocaltrol, coreg 12.5 bid, clonidine patch 0.3 weekly, hydralazine 50 bid, lantus 15u hs, synthroid, demadex 100 and 150 alt qod, prns/ vits/ supps ? ?    Date   Creat  eGFR ?    2019  2.58     ?    2020  2.8- 3.0        ?    2021  3.1- 3.7    ?   1st half 2022   3.9- 4.2    ?   2nd half 2022 3.70- 4.26  ?    Jan -march 2023 4.1- 4.8 10- 11 ml/min     ?    05/11/21  4.54     ? ?   CXR - CM w/ mild central congestion as on previous film ? ?     K 4.6  CO2 23  BUn 102  Cr 4.54  eGFR 10  Ca 9.8  Alb 3.5  phos 5.1 ?    WBC 6k  Hb 9.9 ?  ? ? ? ?Assessment/ Plan: ?CKD V - w/ admission for acute STEMI, sent for LHC last night for successful PCI/ stenting of distal RCA and LAD lesions. Pt received IV contrast overnight w/ the LHC.  Pt is voiding this morning post cath, and creat stable so far. Usually creat bump will happen within 24-36 hrs if there is any sig renal injury. Pt is euvolemic, voiding, no uremic signs. F/b Dr Johnney Ou for CKD. F/u labs in morning tomorrow. Cont IVF's at 75 cc/hr for another 24 hrs. Get UA and urine lytes. No uremic signs. Will follow.  ?STEMI - sp PCI, as above ?DM2 ?Anemia - Hb 9.9, pt is JH ?Hypothyroidism ?HTN - bp's controlled on 3 of her 4 home BP meds.  ?  ? ? ? ?Kelly Splinter  MD ?05/12/2021, 8:18 AM ?Recent Labs  ?Lab 05/11/21 ?2339 05/12/21 ?0426  ?HGB 9.8* 9.9*  ?CALCIUM 9.8  --   ?CREATININE 4.54*  --   ?K 4.6  --   ? ? ?

## 2021-05-12 NOTE — H&P (Addendum)
?Cardiology Admission History and Physical:  ? ?Patient ID: Tiffany [name] ?MRN: [id number]; DOB: 1950-05-23  ? ?Admission date: 05/11/2021 ? ?Primary Care Provider: Nolene Ebbs, MD ?[facility] Cardiologist: Sinclair Grooms, MD  ?[facility] Electrophysiologist:  None  ? ?Chief Complaint: inferior STEMI ? ?Patient Profile:  ? ?Tiffany [name] is a 71 y.o. female with HFpEF, CKD5, HLD, HTN, hypothyroidism, obesity, OSA on CPAP, Jehovah's Witness (NO BLOOD PRODUCTS) who presents with acute onset central non radiating CP with RCA STEMI on ECG.  ? ?History of Present Illness:  ? ?Tiffany [name] reported that she had 8/10 severity non radiating chest pain that started at 10 PM on 05/12/21 while she was at home and at rest. She had associated nausea and diaphoresis. She has had no recent anginal equivalents leading up to this. She has never had coronary interventions or bypass surgery. She was last seen in OP cardiology last year on 05/09/20 by Ermalinda Barrios. Her primary cardiologist is Dr. Tamala Julian. She had an acute HFpEF exacerbation requiring hospitalization in 04/2020. She has required high dose diuretics following that hospitalization (torsemide 100 mg daily, metolazone 5 mg T/R/Sa) which was later increased to torsemide 150 mg daily and metolazone prn. She was apparently referred to hospice in 05/2020 as she had refused dialysis. ? ?Today she presents to the ED with chest pain since 10 PM. She was given 2 SLNG by EMS without relief and ASA 324 mg by EMS. She was still in 8/10 pain but conversant during my discussion. We had extensive discussion regarding her GOC and the risk of coronary angiography resulting in further renal dysfunction and the need for dialysis. In light of her previous discussions regarding dialysis and reluctance to start she now says she would consider dialysis if it was needed following coronary angiography. Additionally she is a Sales promotion account executive witness and refuses all blood products. She was  very clear about her reluctance to get blood products even if if meant death. She has had conversations recently with her sister regarding [place] and understands the need to be FULL code during the procedure but after further discussion would be DNAR following procedure which is consistent with her prior discussions. Options for medical management versus invasive coronary angiography with PCI were discussed and the patient wanted to proceed with coronary angiography despite a high chance of further renal decompensation.  ? ?She was taken for emergent PCI and a distal RCA 100% occlusion was treated with angioplasty and DES x1 with TIMI 3 flow. She tolerated the procedure without issue. Chest pain free at the completion of the procedure.  ? ?Past Medical History:  ?Diagnosis Date  ? ALLERGIC RHINITIS   ? Arthritis   ? CHF (congestive heart failure) ([place])   ? CKD (chronic kidney disease)   ? COVID 01/21/2021  ? + HOME TEST  HAD COUGH CONGESTION AND MALAISE, OCC CONGESTION NOW  ? Heart murmur   ? at birth  ? Hyperlipidemia   ? Hypertension   ? Hypothyroid   ? Iron deficiency anemia   ? Obesity   ? OSA on CPAP   ? Refusal of blood transfusions as patient is Jehovah's Witness   ? Shingles 11/2020  ? LEFT BACK  ? Type 2 dm with insulin use   ? checks abg qday runs 125  ? Wears glasses   ? ?Past Surgical History:  ?Procedure Laterality Date  ? BREAST BIOPSY    ? left breast per pt; benign 20+ yrs ago  ?  BREAST REDUCTION SURGERY  1982  ? COLONOSCOPY WITH PROPOFOL N/A 10/24/2020  ? Procedure: COLONOSCOPY WITH PROPOFOL;  Surgeon: Ronnette Juniper, MD;  Location: [facility];  Service: Gastroenterology;  Laterality: N/A;  ? KNEE SURGERY Bilateral   ? x 2  ? LUMBAR DISC SURGERY    ? x 2  ? POLYPECTOMY  10/24/2020  ? Procedure: POLYPECTOMY;  Surgeon: Ronnette Juniper, MD;  Location: [facility];  Service: Gastroenterology;;  ? REDUCTION MAMMAPLASTY Bilateral 1985  ? TOE SURGERY    ? yrs ago per pt on 02-02-2021  ? TOTAL ABDOMINAL  HYSTERECTOMY  1997  ? partial hysterectomy- still has ovaries  ? TOTAL KNEE ARTHROPLASTY Left 07/01/2019  ? Procedure: TOTAL KNEE ARTHROPLASTY;  Surgeon: Paralee Cancel, MD;  Location: [facility];  Service: Orthopedics;  Laterality: Left;  70 min ?NO BLOOD PRODUCTS!!  ? TREATMENT FISTULA ANAL    ? yrs ago per pt 0n 02-02-2021  ? TRIGGER FINGER RELEASE Left 02/08/2021  ? Procedure: Left Ring trigger finger release, left ring finger volar ganglion cyst excision;  Surgeon: Orene Desanctis, MD;  Location: [facility];  Service: Orthopedics;  Laterality: Left;  with local anesthesia  ?  ?Medications Prior to Admission: ?Prior to Admission medications   ?Medication Sig Start Date End Date Taking? Authorizing Provider  ?albuterol (VENTOLIN HFA) 108 (90 Base) MCG/ACT inhaler Inhale 2 puffs into the lungs every 6 (six) hours as needed for wheezing or shortness of breath.    [provider]  ?amLODipine (NORVASC) 10 MG tablet Take 10 mg by mouth 2 (two) times daily.    [provider]  ?aspirin EC 81 MG tablet Take 81 mg by mouth every evening. Swallow whole.    [provider]  ?B Complex-C (B-COMPLEX WITH VITAMIN C) tablet Take 1 tablet by mouth in the morning.    [provider]  ?budesonide-formoterol (SYMBICORT) 160-4.5 MCG/ACT inhaler Inhale 2 puffs then rinse mouth, twice daily 09/25/20   Baird Lyons D, MD  ?calcitRIOL (ROCALTROL) 0.5 MCG capsule Take 0.5 mcg by mouth in the morning. 08/03/20   [provider]  ?carvedilol (COREG) 12.5 MG tablet Take 12.5 mg by mouth 2 (two) times daily. 09/15/20   [provider]  ?cetirizine (ZYRTEC) 10 MG tablet Take 10 mg by mouth daily as needed for allergies.    [provider]  ?cloNIDine (CATAPRES - DOSED IN MG/24 HR) 0.3 mg/24hr patch Place 0.3 mg onto the skin every Friday.    [provider]  ?hydrALAZINE (APRESOLINE) 50 MG tablet Take 50 mg by mouth 2 (two) times daily. 08/10/20   [provider]  ?LANTUS SOLOSTAR 100 UNIT/ML Solostar Pen Inject 15 Units into the skin at bedtime. 08/08/20   [provider]  ?levothyroxine (SYNTHROID) 112 MCG tablet Take 224 mcg by mouth daily before breakfast.    [provider]  ?Multiple Vitamins-Minerals (PRESERVISION AREDS 2 PO) Take 1 tablet by mouth in the morning.    [provider]  ?Omega-3 Fatty Acids (FISH OIL) 500 MG CAPS Take 1 capsule by mouth at bedtime.    [provider]  ?Propylene Glycol (SYSTANE COMPLETE) 0.6 % SOLN Place 1 drop into both eyes in the morning, at noon, in the evening, and at bedtime.    [provider]  ?torsemide (DEMADEX) 100 MG tablet Take 100-150 mg by mouth See admin instructions. Take 1 tablet (100 mg) by mouth every other day alternating with taking 1.5 tablets (150 mg) by mouth  every other day.    [provider]  ?zolpidem (AMBIEN) 10 MG tablet Take 10 mg by mouth at bedtime.    [provider]  ?  ?Allergies:    ?Allergies  ?Allergen Reactions  ? Other   ?  No blood products  ? Sulfa Antibiotics Rash  ? ?Social History:   ?Social History  ? ?Socioeconomic History  ? Marital status: Single  ?  Spouse name: Not on file  ? Number of children: Not on file  ? Years of education: Not on file  ? Highest education level: Not on file  ?Occupational History  ? Occupation: school bus driver  ?Tobacco Use  ? Smoking status: Never  ? Smokeless tobacco: Never  ?Vaping Use  ? Vaping Use: Never used  ?Substance and Sexual Activity  ? Alcohol use: Not Currently  ? Drug use: No  ? Sexual activity: Not Currently  ?  Birth control/protection: Surgical  ?Other Topics Concern  ? Not on file  ?Social History Narrative  ? Not on file  ? ?Social Determinants of Health  ? ?Financial Resource Strain: Not on file  ?Food Insecurity: Not on file  ?Transportation Needs: Not on file  ?Physical Activity: Not on file  ?Stress: Not on file  ?Social Connections: Not on file  ?Intimate  Partner Violence: Not on file  ?  ?Family History:   ?The patient's family history includes Congestive Heart Failure in her mother; Diabetes in her sister; Other in her sister; Prostate cancer in her father. There is no histo

## 2021-05-12 NOTE — Progress Notes (Signed)
Echocardiogram ?2D Echocardiogram has been performed. ? ?Oneal Deputy Glennys Schorsch RDCS ?05/12/2021, 9:21 AM ?

## 2021-05-12 NOTE — Progress Notes (Addendum)
?Cardiology Progress Note  ?Patient ID: Tiffany [name] ?MRN: [id number] ?DOB: 1950/02/09 ?Date of Encounter: 05/12/2021 ? ?Primary Cardiologist: Sinclair Grooms, MD ? ?Subjective  ? ?Chief Complaint: None.  ? ?HPI: Status post PCI to RCA.  Bleeding noted from the right radial TR band.  On Aggrastat.  CKD stage V noted. ? ?ROS:  ?All other ROS reviewed and negative. Pertinent positives noted in the HPI.    ? ?Inpatient Medications  ?Scheduled Meds: ? atorvastatin  80 mg Oral QHS  ? calcitRIOL  0.5 mcg Oral Daily  ? carvedilol  12.5 mg Oral BID WC  ? cloNIDine  0.3 mg Transdermal Q Fri  ? hydrALAZINE  50 mg Oral BID  ? levothyroxine  224 mcg Oral QAC breakfast  ? polyvinyl alcohol  1 drop Both Eyes Daily  ? sodium chloride flush  3 mL Intravenous Q12H  ? ?Continuous Infusions: ? sodium chloride 75 mL/hr at 05/12/21 0252  ? sodium chloride    ? nitroGLYCERIN Stopped (05/12/21 0218)  ? tirofiban 0.075 mcg/kg/min (05/12/21 0600)  ? ?PRN Meds: ?sodium chloride, acetaminophen, hydrALAZINE, labetalol, nitroGLYCERIN, ondansetron (ZOFRAN) IV, sodium chloride flush  ? ?Vital Signs  ? ?Vitals:  ? 05/12/21 0600 05/12/21 0615 05/12/21 0630 05/12/21 0645  ?BP: (!) 156/77 (!) 157/76 (!) 162/81 (!) 165/78  ?Pulse: 67 68 70 69  ?Resp: 17 (!) '30 16 16  '$ ?Temp:    (!) 97.5 ?F (36.4 ?C)  ?TempSrc:    Oral  ?SpO2: 99% 98%    ?Weight:      ?Height:      ? ? ?Intake/Output Summary (Last 24 hours) at 05/12/2021 0715 ?Last data filed at 05/12/2021 0600 ?Gross per 24 hour  ?Intake 23.6 ml  ?Output 675 ml  ?Net -651.4 ml  ? ? ?  05/12/2021  ?  2:32 AM 05/11/2021  ? 11:27 PM 04/30/2021  ? 12:29 PM  ?Last 3 Weights  ?Weight (lbs) 214 lb 1.1 oz 216 lb 0.8 oz 216 lb  ?Weight (kg) 97.1 kg 98 kg 97.977 kg  ?   ? ?Telemetry  ?Overnight telemetry shows sinus rhythm heart rate 60s, PVCs noted, which I personally reviewed.  ? ?ECG  ?The most recent ECG shows sinus rhythm, poor R wave progression no ST elevation, which I personally reviewed.  ? ?Physical Exam   ? ?Vitals:  ? 05/12/21 0600 05/12/21 0615 05/12/21 0630 05/12/21 0645  ?BP: (!) 156/77 (!) 157/76 (!) 162/81 (!) 165/78  ?Pulse: 67 68 70 69  ?Resp: 17 (!) '30 16 16  '$ ?Temp:    (!) 97.5 ?F (36.4 ?C)  ?TempSrc:    Oral  ?SpO2: 99% 98%    ?Weight:      ?Height:      ?  ?Intake/Output Summary (Last 24 hours) at 05/12/2021 0715 ?Last data filed at 05/12/2021 0600 ?Gross per 24 hour  ?Intake 23.6 ml  ?Output 675 ml  ?Net -651.4 ml  ?  ? ?  05/12/2021  ?  2:32 AM 05/11/2021  ? 11:27 PM 04/30/2021  ? 12:29 PM  ?Last 3 Weights  ?Weight (lbs) 214 lb 1.1 oz 216 lb 0.8 oz 216 lb  ?Weight (kg) 97.1 kg 98 kg 97.977 kg  ?  Body mass index is 31.61 kg/m?.  ?General: Well nourished, well developed, in no acute distress ?Head: Atraumatic, normal size  ?Eyes: PEERLA, EOMI  ?Neck: Supple, no JVD ?Endocrine: No thryomegaly ?Cardiac: Normal S1, S2; RRR; no murmurs, rubs, or gallops ?Lungs: Clear to auscultation  bilaterally, no wheezing, rhonchi or rales  ?Abd: Soft, nontender, no hepatomegaly  ?Ext: No edema, pulses 2+ ?Musculoskeletal: No deformities, BUE and BLE strength normal and equal ?Skin: Warm and dry, no rashes   ?Neuro: Alert and oriented to person, place, time, and situation, CNII-XII grossly intact, no focal deficits  ?Psych: Normal mood and affect  ? ?Labs  ?High Sensitivity Troponin:   ?Recent Labs  ?Lab 05/11/21 ?2339 05/12/21 ?0426  ?TROPONINIHS 169* 876*  ?   ?Cardiac EnzymesNo results for input(s): TROPONINI in the last 168 hours. No results for input(s): TROPIPOC in the last 168 hours.  ?Chemistry ?Recent Labs  ?Lab 05/11/21 ?2339  ?NA 141  ?K 4.6  ?CL 108  ?CO2 23  ?GLUCOSE 182*  ?BUN 102*  ?CREATININE 4.54*  ?CALCIUM 9.8  ?GFRNONAA 10*  ?ANIONGAP 10  ?  ?Hematology ?Recent Labs  ?Lab 05/11/21 ?2339 05/12/21 ?0426  ?WBC 5.0 6.1  ?RBC 3.78* 3.74*  ?HGB 9.8* 9.9*  ?HCT 32.5* 32.8*  ?MCV 86.0 87.7  ?MCH 25.9* 26.5  ?MCHC 30.2 30.2  ?RDW 16.0* 17.4*  ?PLT 108* 115*  ? ?BNPNo results for input(s): BNP, PROBNP in the last 168  hours.  ?DDimer No results for input(s): DDIMER in the last 168 hours.  ? ?Radiology  ?CARDIAC CATHETERIZATION ? ?Result Date: 05/12/2021 ?  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   Dist RCA lesion is 80% stenosed.   A stent was successfully placed.   A stent was successfully placed.   Post intervention, there is a 0% residual stenosis.   Post intervention, there is a 0% residual stenosis.   LV end diastolic pressure is normal. 1.  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 2.  LVEDP of 11 mmHg. Recommendations: Aggrastat infusion for 18 hours and dual antiplatelet therapy for at least 1 year.  Given the patient's chronic kidney disease this will need to be monitored over the next few days and the need for renal replacement therapy assessed.  ? ?DG Chest Port 1 View ? ?Result Date: 05/12/2021 ?CLINICAL DATA:  Chest pain EXAM: PORTABLE CHEST 1 VIEW COMPARISON:  09/29/2020, 09/25/2020 FINDINGS: Moderate cardiomegaly with central congestion. Aortic atherosclerosis. No consolidation, pleural effusion, or pneumothorax. IMPRESSION: Cardiomegaly with mild central congestion similar compared to prior. Electronically Signed   By: Donavan Foil M.D.   On: 05/12/2021 00:17   ? ?Cardiac Studies  ?LHC 05/12/2021 ?  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 ?  Dist RCA lesion is 80% stenosed. ?  A stent was successfully placed. ?  A stent was successfully placed. ?  Post intervention, there is a 0% residual stenosis. ?  Post intervention, there is a 0% residual stenosis. ?  LV end diastolic pressure is normal. ?  ?1.  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 ?2.  LVEDP of  11 mmHg. ?  ?Recommendations: Aggrastat infusion for 18 hours and dual antiplatelet therapy for at least 1 year.  Given the patient's chronic kidney disease this will need to be monitored over the next few days and the need for renal replacement therapy assessed. ? ?Patient Profile  ?Tiffany [name] is a 71 y.o. female with CKD  stage V (not on dialysis), hypertension, hyperlipidemia, who was admitted on 05/12/2021 with acute inferior STEMI.  Status post PCI. ? ?Assessment & Plan  ? ?#Inferior STEMI ?-Status post PCI to the distal RCA. ?-Good result.  EKG shows ST segments have resolved. ?-Blood pressure elevated.  Continue Coreg.  Increase to 25 mg twice daily.  Increase hydralazine to 50 mg 3 times daily.  Also on clonidine. ?-She is on high intensity statin 80 mg of Lipitor. ?-Plan was to continue Aggrastat for 18 hours but she is having significant oozing.  Given chronic anemia related to CKD stage V if she continues to ooze we will stop Aggrastat.  She may not be able to complete 18 hours.  This is okay. ?-Continue aspirin 81 mg daily.  Brilinta 90 mg twice daily.  DAPT for 1 year. ?-Echo pending. ? ?#CKD stage V not on hemodialysis ?-Long history of indecisiveness regarding the initiation of dialysis.  Did receive contrast load. ?-For now we will avoid nephrotoxic agents. ?-We will have nephrology evaluate her as I suspect she may end up needing dialysis given her contrast exposure. ?-She again reports she is unsure about dialysis.  We will have nephrology have a discussion with her on this. ?-We will also asked nephrology to manage her ESRD medications. ? ?#Hypertension ?-Increased Coreg to 25 mg twice daily.  Hydralazine 50 mg 3 times daily.  Clonidine patch 0.3 mg every 7 days. ?-We will add other agents as needed. ? ?#Hypothyroidism ?-TSH too low.  Free T4 and T3 tomorrow.  Reduce home Synthroid to 175 mcg daily. ? ?#Anemia ?-Related to CKD stage V.  She is due to was witnessed.  No blood  products. ?-Regarding the TR band we will likely just stop Aggrastat due to bleeding.  Given chronic anemia bleeding is a big concern.  Also she will not receive blood products. ? ?#DM ?-A1c 6.7 ?-SSI ? ?FEN ?-no IVF ?-diet: re

## 2021-05-12 NOTE — ED Provider Notes (Signed)
?[facility] ?Provider Note ? ? ?CSN: [id number] ?Arrival date & time: 05/11/21  2321 ? ?  ? ?History ? ?Chief Complaint  ?Patient presents with  ? Chest Pain  ? ? ?Tiffany [name] is a 71 y.o. female. ? ?71 year old female with a history of chronic kidney disease, hypertension, diabetes and heart failure the presents to the ER today secondary to chest pain.  Patient states it started pretty suddenly at 2030.  Has not really gotten any better.  Has some associated nausea and lightheadedness.  No syncope.  No vomiting or diaphoresis.  Never had had this before.  It is behind her left breast and does not radiate anywhere.  EMS was called.  EKG with shows some inverted T wave in aVL but otherwise unremarkable.  Was given 2 nitroglycerin and 4 aspirin brought here for further evaluation. ? ? ?Chest Pain ? ?  ? ?Home Medications ?Prior to Admission medications   ?Medication Sig Start Date End Date Taking? Authorizing Provider  ?albuterol (VENTOLIN HFA) 108 (90 Base) MCG/ACT inhaler Inhale 2 puffs into the lungs every 6 (six) hours as needed for wheezing or shortness of breath.    [provider]  ?amLODipine (NORVASC) 10 MG tablet Take 10 mg by mouth 2 (two) times daily.    [provider]  ?aspirin EC 81 MG tablet Take 81 mg by mouth every evening. Swallow whole.    [provider]  ?B Complex-C (B-COMPLEX WITH VITAMIN C) tablet Take 1 tablet by mouth in the morning.    [provider]  ?budesonide-formoterol (SYMBICORT) 160-4.5 MCG/ACT inhaler Inhale 2 puffs then rinse mouth, twice daily 09/25/20   Baird Lyons D, MD  ?calcitRIOL (ROCALTROL) 0.5 MCG capsule Take 0.5 mcg by mouth in the morning. 08/03/20   [provider]  ?carvedilol (COREG) 12.5 MG tablet Take 12.5 mg by mouth 2 (two) times daily. 09/15/20   [provider]  ?cetirizine (ZYRTEC) 10 MG tablet Take 10 mg by mouth daily as needed for allergies.    [provider]  ?cloNIDine (CATAPRES - DOSED IN MG/24 HR) 0.3 mg/24hr patch Place 0.3 mg onto the skin every Friday.    [provider]  ?hydrALAZINE (APRESOLINE) 50 MG tablet Take 50 mg by mouth 2 (two) times daily. 08/10/20   [provider]  ?LANTUS SOLOSTAR 100 UNIT/ML Solostar Pen Inject 15 Units into the skin at bedtime. 08/08/20   [provider]  ?levothyroxine (SYNTHROID) 112 MCG tablet Take 224 mcg by mouth daily before breakfast.    [provider]  ?Multiple Vitamins-Minerals (PRESERVISION AREDS 2 PO) Take 1 tablet by mouth in the morning.    [provider]  ?Omega-3 Fatty Acids (FISH OIL) 500 MG CAPS Take 1 capsule by mouth at bedtime.    [provider]  ?Propylene Glycol (SYSTANE COMPLETE) 0.6 % SOLN Place 1 drop into both eyes in the morning, at noon, in the evening, and at bedtime.    [provider]  ?torsemide (DEMADEX) 100 MG tablet Take 100-150 mg by mouth See admin instructions. Take 1 tablet (100 mg) by mouth every other day alternating with taking 1.5 tablets (150 mg) by mouth every other day.    [provider]  ?zolpidem (AMBIEN) 10 MG tablet Take 10 mg by mouth at bedtime.    [provider]  ?   ? ?Allergies    ?Other and Sulfa antibiotics   ? ?Review of Systems   ?  Review of Systems  ?Cardiovascular:  Positive for chest pain.  ? ?Physical Exam ?Updated Vital Signs ?BP (!) 167/79   Pulse 61   Temp 97.7 ?F (36.5 ?C) (Oral)   Resp 16   Ht '5\' 9"'$  (1.753 m)   Wt 98 kg   SpO2 96%   BMI 31.91 kg/m?  ?Physical Exam ?Vitals and nursing note reviewed.  ?Constitutional:   ?   Appearance: She is well-developed.  ?HENT:  ?   Head: Normocephalic and atraumatic.  ?Cardiovascular:  ?   Rate and Rhythm: Normal rate and regular rhythm.  ?Pulmonary:  ?   Effort: Pulmonary effort is normal. No respiratory distress.  ?   Breath sounds: No stridor.  ?Chest:  ?   Chest wall: No mass, deformity or tenderness.  ?Abdominal:  ?   General: There  is no distension.  ?Musculoskeletal:     ?   General: Normal range of motion.  ?   Cervical back: Normal range of motion.  ?   Right lower leg: No edema.  ?   Left lower leg: No edema.  ?Skin: ?   General: Skin is warm and dry.  ?Neurological:  ?   Mental Status: She is alert.  ? ? ?ED Results / Procedures / Treatments   ?Labs ?(all labs ordered are listed, but only abnormal results are displayed) ?Labs Reviewed  ?BASIC METABOLIC PANEL - Abnormal; Notable for the following components:  ?    Result Value  ? Glucose, Bld 182 (*)   ? BUN 102 (*)   ? Creatinine, Ser 4.54 (*)   ? GFR, Estimated 10 (*)   ? All other components within normal limits  ?HEMOGLOBIN A1C - Abnormal; Notable for the following components:  ? Hgb A1c MFr Bld 6.7 (*)   ? All other components within normal limits  ?CBC WITH DIFFERENTIAL/PLATELET - Abnormal; Notable for the following components:  ? RBC 3.78 (*)   ? Hemoglobin 9.8 (*)   ? HCT 32.5 (*)   ? MCH 25.9 (*)   ? RDW 16.0 (*)   ? Platelets 108 (*)   ? All other components within normal limits  ?TROPONIN I (HIGH SENSITIVITY) - Abnormal; Notable for the following components:  ? Troponin I (High Sensitivity) 169 (*)   ? All other components within normal limits  ?RESP PANEL BY RT-PCR (FLU A&B, COVID) [id number]  ?PROTIME-INR  ?APTT  ?LIPID PANEL  ? ? ?EKG ?EKG Interpretation ? ?Date/Time:  Friday May 11 2021 23:32:32 EDT ?Ventricular Rate:  59 ?PR Interval:  184 ?QRS Duration: 92 ?QT Interval:  454 ?QTC Calculation: 449 ?R Axis:   81 ?Text Interpretation:  Critical Test Result: STEMI Sinus bradycardia Cannot rule out Anterior infarct , age undetermined Inferior injury pattern ** ** ACUTE MI / STEMI ** ** Consider right ventricular involvement in acute inferior infarct Abnormal ECG When compared with ECG of 29-Sep-2020 14:58, PREVIOUS ECG IS PRESENT Confirmed by Merrily Pew [phone]) on 05/11/2021 11:35:57 PM ? ?Radiology ?DG Chest Port 1 View ? ?Result Date: 05/12/2021 ?CLINICAL DATA:  Chest pain EXAM:  PORTABLE CHEST 1 VIEW COMPARISON:  09/29/2020, 09/25/2020 FINDINGS: Moderate cardiomegaly with central congestion. Aortic atherosclerosis. No consolidation, pleural effusion, or pneumothorax. IMPRESSION: Cardiomegaly with mild central congestion similar compared to prior. Electronically Signed   By: Donavan Foil M.D.   On: 05/12/2021 00:17   ? ?Procedures ?Mar[facility] KitchenCritical Care ?Performed by: Merrily Pew, MD ?Authorized by: Merrily Pew, MD  ? ?Critical care provider statement:  ?  Critical care time (minutes):  30 ?  Critical care was necessary to treat or prevent imminent or life-threatening deterioration of the following conditions:  Cardiac failure ?  Critical care was time spent personally by me on the following activities:  Development of treatment plan with patient or surrogate, discussions with consultants, evaluation of patient's response to treatment, examination of patient, ordering and review of laboratory studies, ordering and review of radiographic studies, ordering and performing treatments and interventions, pulse oximetry, re-evaluation of patient's condition and review of old charts  ? ? ?Medications Ordered in ED ?Medications  ?0.9 %  sodium chloride infusion ( Intravenous New Bag/Given 05/11/21 2349)  ?aspirin chewable tablet 324 mg ( Oral MAR Hold 05/12/21 0038)  ?nitroGLYCERIN 50 mg in dextrose 5 % 250 mL (0.2 mg/mL) infusion (40 mcg/min Intravenous Rate/Dose Change 05/12/21 0028)  ?heparin sodium (porcine) injection (1,000 Units Intravenous Given 05/12/21 0047)  ?Heparin (Porcine) in NaCl 1000-0.9 UT/500ML-% SOLN (500 mLs  Given 05/12/21 0047)  ?fentaNYL (SUBLIMAZE) injection (25 mcg Intravenous Given 05/12/21 0048)  ?midazolam (VERSED) injection (1 mg Intravenous Given 05/12/21 0049)  ?heparin injection 4,000 Units (4,000 Units Intravenous Given 05/11/21 2346)  ? ? ?ED Course/ Medical Decision Making/ A&P ?  ?                        ?Medical Decision Making ?Amount and/or Complexity of Data Reviewed ?Labs:  ordered. ?Radiology: ordered. ? ?Risk ?OTC drugs. ?Prescription drug management. ?Decision regarding hospitalization. ? ? ?I was brought EKG and it did not seem consistent with a STEMI.  I informed nursing im

## 2021-05-12 NOTE — Progress Notes (Signed)
Gave pt MI book and began discussing. Pt fatigued and renal in to eval. Will f/u Monday. ?1130-1140 ?Yves Dill CES, ACSM ?12:56 PM ?05/12/2021 ? ?

## 2021-05-13 DIAGNOSIS — N186 End stage renal disease: Secondary | ICD-10-CM | POA: Diagnosis not present

## 2021-05-13 DIAGNOSIS — I2111 ST elevation (STEMI) myocardial infarction involving right coronary artery: Secondary | ICD-10-CM | POA: Diagnosis not present

## 2021-05-13 DIAGNOSIS — I10 Essential (primary) hypertension: Secondary | ICD-10-CM

## 2021-05-13 DIAGNOSIS — E119 Type 2 diabetes mellitus without complications: Secondary | ICD-10-CM

## 2021-05-13 DIAGNOSIS — I5032 Chronic diastolic (congestive) heart failure: Secondary | ICD-10-CM | POA: Diagnosis not present

## 2021-05-13 DIAGNOSIS — E039 Hypothyroidism, unspecified: Secondary | ICD-10-CM

## 2021-05-13 LAB — CBC
HCT: 29.6 % — ABNORMAL LOW (ref 36.0–46.0)
Hemoglobin: 9.1 g/dL — ABNORMAL LOW (ref 12.0–15.0)
MCH: 26.1 pg (ref 26.0–34.0)
MCHC: 30.7 g/dL (ref 30.0–36.0)
MCV: 85.1 fL (ref 80.0–100.0)
Platelets: 86 10*3/uL — ABNORMAL LOW (ref 150–400)
RBC: 3.48 MIL/uL — ABNORMAL LOW (ref 3.87–5.11)
RDW: 15.9 % — ABNORMAL HIGH (ref 11.5–15.5)
WBC: 6.1 10*3/uL (ref 4.0–10.5)
nRBC: 0 % (ref 0.0–0.2)

## 2021-05-13 LAB — BASIC METABOLIC PANEL
Anion gap: 9 (ref 5–15)
BUN: 95 mg/dL — ABNORMAL HIGH (ref 8–23)
CO2: 20 mmol/L — ABNORMAL LOW (ref 22–32)
Calcium: 9.5 mg/dL (ref 8.9–10.3)
Chloride: 110 mmol/L (ref 98–111)
Creatinine, Ser: 4.08 mg/dL — ABNORMAL HIGH (ref 0.44–1.00)
GFR, Estimated: 11 mL/min — ABNORMAL LOW (ref >60)
Glucose, Bld: 157 mg/dL — ABNORMAL HIGH (ref 70–99)
Potassium: 4.1 mmol/L (ref 3.5–5.1)
Sodium: 139 mmol/L (ref 135–145)

## 2021-05-13 LAB — GLUCOSE, CAPILLARY
Glucose-Capillary: 134 mg/dL — ABNORMAL HIGH (ref 70–99)
Glucose-Capillary: 157 mg/dL — ABNORMAL HIGH (ref 70–99)
Glucose-Capillary: 155 mg/dL — ABNORMAL HIGH (ref 70–99)
Glucose-Capillary: 97 mg/dL (ref 70–99)

## 2021-05-13 LAB — POCT ACTIVATED CLOTTING TIME
Activated Clotting Time: 317 seconds
Activated Clotting Time: 305 seconds

## 2021-05-13 LAB — T4, FREE: Free T4: 1.55 ng/dL — ABNORMAL HIGH (ref 0.61–1.12)

## 2021-05-13 MED ORDER — SODIUM CHLORIDE 0.9 % IV SOLN: INTRAVENOUS | Status: DC

## 2021-05-13 MED ORDER — AMLODIPINE BESYLATE 10 MG PO TABS
10.0000 mg | ORAL_TABLET | Freq: Every day | ORAL | Status: DC
  Administered 2021-05-13 – 2021-05-20 (×8): 10 mg via ORAL
  Filled 2021-05-13 (×8): qty 1

## 2021-05-13 MED ORDER — CHLORHEXIDINE GLUCONATE CLOTH 2 % EX PADS: 6.0000 | MEDICATED_PAD | Freq: Every day | CUTANEOUS | Status: DC

## 2021-05-13 MED ORDER — HYDRALAZINE HCL 50 MG PO TABS
100.0000 mg | ORAL_TABLET | Freq: Three times a day (TID) | ORAL | Status: DC
  Administered 2021-05-13 – 2021-05-20 (×22): 100 mg via ORAL
  Filled 2021-05-13 (×22): qty 2

## 2021-05-13 NOTE — Progress Notes (Addendum)
[facility] ?Progress Note ? ?Subjective: BP's a bit high. Great UOP yest 2.1 L. Creat 4.0 this am.  ? ?Vitals:  ? 05/13/21 0600 05/13/21 0700 05/13/21 0800 05/13/21 0900  ?BP: (!) 167/88 (!) 197/116 (!) 184/91 (!) 161/79  ?Pulse: 72 73 68 69  ?Resp: (!) 25 (!) 21 17 (!) 23  ?Temp:      ?TempSrc:      ?SpO2: 97% 96% 99% 98%  ?Weight:      ?Height:      ? ? ?Exam: ?Gen alert, pleasant older adult AAF ?No jvd or bruits ?Chest clear bilat to bases, no rales/ wheezing ?RRR no RG ?Abd soft ntnd no mass or ascites +bs ?Ext trace ankle edema bilat ?Neuro is alert, Ox 3 , nf ?   ?  ? Home meds include - norvasc, symbicort, rocaltrol, coreg 12.5 bid, clonidine patch 0.3 weekly, hydralazine 50 bid, lantus 15u hs, synthroid, demadex 100 and 150 alt qod, prns/ vits/ supps ?  ?    Date                         Creat               eGFR ?    2019                        2.58                                          ?    2020                        2.8- 3.0                                         ?    2021                        3.1- 3.7                                     ?   1st half 2022            3.9- 4.2    ?   2nd half 2022           3.70- 4.26         ?    Jan -march 2023     4.1- 4.8            10- 11 ml/min                                       ?    05/11/21                    4.54                                          ?  ?  CXR - CM w/ mild central congestion as on previous film ?  ?     K 4.6  CO2 23  BUn 102  Cr 4.54  eGFR 10  Ca 9.8  Alb 3.5  phos 5.1 ?    WBC 6k  Hb 9.9 ?  ?  ?  ?  ?Assessment/ Plan: ?CKD V - w/ admission for acute STEMI. SP PCI to distal RCA and LAD 4/07 overnight. Rec'd IV contrast so there was concern for AKI and possible need for dialysis. However, UOP post cath has been very good and labs are stable, no signs of AKI. F/b Dr Johnney Ou for CKD. I renewed her IVF's overnight, no need for further fluids today, will dc. No further suggestions, will sign off.  ?STEMI - sp PCI, as  above ?DM2 ?Anemia - Hb 9.9, pt is JH ?Hypothyroidism ?HTN - bp's controlled on 3 of her 4 home BP meds.  ?  ?  ?  ? ? ? ?[name], hospital ?05/13/2021, 9:54 AM ? ? ?Recent Labs  ?Lab 05/12/21 ?0221 05/13/21 ?0211  ?HGB 9.2* 9.1*  ?ALBUMIN 3.1*  --   ?CALCIUM 9.7 9.5  ?CREATININE 4.21* 4.08*  ?K 4.4 4.1  ? ?Inpatient medications: ? amLODipine  10 mg Oral Daily  ? aspirin  81 mg Oral Daily  ? atorvastatin  80 mg Oral QHS  ? calcitRIOL  0.5 mcg Oral Daily  ? carvedilol  25 mg Oral BID WC  ? Chlorhexidine Gluconate Cloth  6 each Topical Daily  ? cloNIDine  0.3 mg Transdermal Q Fri  ? heparin injection (subcutaneous)  5,000 Units Subcutaneous Q8H  ? hydrALAZINE  100 mg Oral Q8H  ? insulin aspart  0-15 Units Subcutaneous TID WC  ? insulin aspart  0-5 Units Subcutaneous QHS  ? levothyroxine  175 mcg Oral Q0600  ? polyvinyl alcohol  1 drop Both Eyes Daily  ? sodium chloride flush  3 mL Intravenous Q12H  ? ticagrelor  90 mg Oral BID  ? ? sodium chloride    ? ?sodium chloride, acetaminophen, nitroGLYCERIN, ondansetron (ZOFRAN) IV, sodium chloride flush, zolpidem ? ? ? ? ? ? ?

## 2021-05-13 NOTE — Progress Notes (Signed)
At 2125 pt has 12bts NSVT. Asymptomatic, VS stable, no c/o voiced. Strip saved by CCMD and will continue to monitor. Jessie Foot, RN  ?

## 2021-05-13 NOTE — Evaluation (Signed)
Physical Therapy Evaluation and Discharge ?Patient Details ?Name: Tiffany [name] ?MRN: [id number] ?DOB: 1950/04/26 ?Today's Date: 05/13/2021 ? ?History of Present Illness ? Pt is a 71 y.o. F who presents with acute inferior STEMI s/p PCI 05/12/2021. Significant PMH: CKD stage V (not on dialysis), hypertension, hyperlipidemia.  ?Clinical Impression ? Patient evaluated by Physical Therapy with no further acute PT needs identified. PTA, pt lives alone and is independent. Pt is mobilizing fairly well s/p PCI. Ambulating 160 ft with no assistive device modI. Demonstrates mildly decreased cardiopulmonary endurance. HR 64-100 bpm, SpO2 97-100% on RA, RR to 44 during walk. Education provided regarding activity recommendations and progression. All education has been completed and the patient has no further questions. No follow-up Physical Therapy or equipment needs. PT is signing off. Thank you for this referral. ? ?   ? ?Recommendations for follow up therapy are one component of a multi-disciplinary discharge planning process, led by the attending physician.  Recommendations may be updated based on patient status, additional functional criteria and insurance authorization. ? ?Follow Up Recommendations No PT follow up ? ?  ?Assistance Recommended at Discharge PRN  ?Patient can return home with the following ? Assistance with cooking/housework;Assist for transportation ? ?  ?Equipment Recommendations None recommended by PT  ?Recommendations for Other Services ?    ?  ?Functional Status Assessment Patient has had a recent decline in their functional status and demonstrates the ability to make significant improvements in function in a reasonable and predictable amount of time.  ? ?  ?Precautions / Restrictions Precautions ?Precautions: None ?Restrictions ?Weight Bearing Restrictions: No  ? ?  ? ?Mobility ? Bed Mobility ?Overal bed mobility: Modified Independent ?  ?  ?  ?  ?  ?  ?  ?  ? ?Transfers ?Overall transfer level:  Independent ?Equipment used: None ?  ?  ?  ?  ?  ?  ?  ?  ?  ? ?Ambulation/Gait ?Ambulation/Gait assistance: Modified independent (Device/Increase time) ?Gait Distance (Feet): 160 Feet ?Assistive device: None ?Gait Pattern/deviations: Step-through pattern, Decreased stride length ?Gait velocity: decreased ?  ?  ?General Gait Details: No gross imbalance noted, pt reaching for external support x 1, slower and steady pace ? ?Stairs ?  ?  ?  ?  ?  ? ?Wheelchair Mobility ?  ? ?Modified Rankin (Stroke Patients Only) ?  ? ?  ? ?Balance Overall balance assessment: Mild deficits observed, not formally tested ?  ?  ?  ?  ?  ?  ?  ?  ?  ?  ?  ?  ?  ?  ?  ?  ?  ?  ?   ? ? ? ?Pertinent Vitals/Pain Pain Assessment ?Pain Assessment: No/denies pain  ? ? ?Home Living Family/patient expects to be discharged to:: Private residence ?Living Arrangements: Alone ?Available Help at Discharge: Family;Available PRN/intermittently ?Type of Home: House ?Home Access: Stairs to enter ?Entrance Stairs-Rails: Right;Left;Can reach both ?Entrance Stairs-Number of Steps: 7 ?  ?Home Layout: One level ?Home Equipment: Shower seat ?   ?  ?Prior Function Prior Level of Function : Independent/Modified Independent ?  ?  ?  ?  ?  ?  ?  ?  ?  ? ? ?Hand Dominance  ? Dominant Hand: Right ? ?  ?Extremity/Trunk Assessment  ? Upper Extremity Assessment ?Upper Extremity Assessment: Overall WFL for tasks assessed ?  ? ?Lower Extremity Assessment ?Lower Extremity Assessment: Overall WFL for tasks assessed ?  ? ?Cervical / Trunk Assessment ?  Cervical / Trunk Assessment: Normal  ?Communication  ? Communication: No difficulties  ?Cognition Arousal/Alertness: Awake/alert ?Behavior During Therapy: Flat affect ?Overall Cognitive Status: Within Functional Limits for tasks assessed ?  ?  ?  ?  ?  ?  ?  ?  ?  ?  ?  ?  ?  ?  ?  ?  ?  ?  ?  ? ?  ?General Comments   ? ?  ?Exercises    ? ?Assessment/Plan  ?  ?PT Assessment Patient does not need any further PT services  ?PT  Problem List   ? ?   ?  ?PT Treatment Interventions     ? ?PT Goals (Current goals can be found in the Care Plan section)  ?Acute Rehab PT Goals ?Patient Stated Goal: to sleep ?PT Goal Formulation: All assessment and education complete, DC therapy ? ?  ?Frequency   ?  ? ? ?Co-evaluation   ?  ?  ?  ?  ? ? ?  ?AM-PAC PT "6 Clicks" Mobility  ?Outcome Measure Help needed turning from your back to your side while in a flat bed without using bedrails?: None ?Help needed moving from lying on your back to sitting on the side of a flat bed without using bedrails?: None ?Help needed moving to and from a bed to a chair (including a wheelchair)?: None ?Help needed standing up from a chair using your arms (e.g., wheelchair or bedside chair)?: None ?Help needed to walk in hospital room?: None ?Help needed climbing 3-5 steps with a railing? : A Little ?6 Click Score: 23 ? ?  ?End of Session   ?Activity Tolerance: Patient tolerated treatment well ?Patient left: in bed;with call bell/phone within reach ?Nurse Communication: Mobility status ?PT Visit Diagnosis: Difficulty in walking, not elsewhere classified (R26.2) ?  ? ?Time: 1245-8099 ?PT Time Calculation (min) (ACUTE ONLY): 22 min ? ? ?Charges:   PT Evaluation ?$PT Eval Moderate Complexity: 1 Mod ?  ?  ?   ? ? ?Wyona Almas, PT, DPT ?Acute Rehabilitation Services ?Pager [phone] ?Office [phone] ? ? ?Deno Etienne ?05/13/2021, 10:51 AM ? ?

## 2021-05-13 NOTE — Progress Notes (Signed)
?Cardiology Progress Note  ?Patient ID: Tiffany [name] ?MRN: [id number] ?DOB: 11-13-1950 ?Date of Encounter: 05/13/2021 ? ?Primary Cardiologist: Sinclair Grooms, MD ? ?Subjective  ? ?Chief Complaint: None.  ? ?HPI: Denies chest pain.  NSVT on monitor yesterday.  Seems to be improving.  Echo shows normal LV function.  Kidney function is remaining stable. ? ?ROS:  ?All other ROS reviewed and negative. Pertinent positives noted in the HPI.    ? ?Inpatient Medications  ?Scheduled Meds: ? amLODipine  10 mg Oral Daily  ? aspirin  81 mg Oral Daily  ? atorvastatin  80 mg Oral QHS  ? calcitRIOL  0.5 mcg Oral Daily  ? carvedilol  25 mg Oral BID WC  ? Chlorhexidine Gluconate Cloth  6 each Topical Daily  ? cloNIDine  0.3 mg Transdermal Q Fri  ? heparin injection (subcutaneous)  5,000 Units Subcutaneous Q8H  ? hydrALAZINE  100 mg Oral Q8H  ? insulin aspart  0-15 Units Subcutaneous TID WC  ? insulin aspart  0-5 Units Subcutaneous QHS  ? levothyroxine  175 mcg Oral Q0600  ? polyvinyl alcohol  1 drop Both Eyes Daily  ? sodium chloride flush  3 mL Intravenous Q12H  ? ticagrelor  90 mg Oral BID  ? ?Continuous Infusions: ? sodium chloride    ? sodium chloride    ? ?PRN Meds: ?sodium chloride, acetaminophen, nitroGLYCERIN, ondansetron (ZOFRAN) IV, sodium chloride flush, zolpidem  ? ?Vital Signs  ? ?Vitals:  ? 05/13/21 0400 05/13/21 0500 05/13/21 0600 05/13/21 0700  ?BP: (!) 177/88 (!) 189/87 (!) 167/88   ?Pulse: 69 71 72 73  ?Resp: (!) 29 20 (!) 25 (!) 21  ?Temp:      ?TempSrc:      ?SpO2: 98% 98% 97% 96%  ?Weight:      ?Height:      ? ? ?Intake/Output Summary (Last 24 hours) at 05/13/2021 0738 ?Last data filed at 05/13/2021 0400 ?Gross per 24 hour  ?Intake 210.1 ml  ?Output 2050 ml  ?Net -1839.9 ml  ? ? ?  05/12/2021  ?  2:32 AM 05/11/2021  ? 11:27 PM 04/30/2021  ? 12:29 PM  ?Last 3 Weights  ?Weight (lbs) 214 lb 1.1 oz 216 lb 0.8 oz 216 lb  ?Weight (kg) 97.1 kg 98 kg 97.977 kg  ?   ? ?Telemetry  ?Overnight telemetry shows sinus rhythm 70s,  NSVT noted, which I personally reviewed.  ? ?Physical Exam  ? ?Vitals:  ? 05/13/21 0400 05/13/21 0500 05/13/21 0600 05/13/21 0700  ?BP: (!) 177/88 (!) 189/87 (!) 167/88   ?Pulse: 69 71 72 73  ?Resp: (!) 29 20 (!) 25 (!) 21  ?Temp:      ?TempSrc:      ?SpO2: 98% 98% 97% 96%  ?Weight:      ?Height:      ?  ?Intake/Output Summary (Last 24 hours) at 05/13/2021 0738 ?Last data filed at 05/13/2021 0400 ?Gross per 24 hour  ?Intake 210.1 ml  ?Output 2050 ml  ?Net -1839.9 ml  ?  ? ?  05/12/2021  ?  2:32 AM 05/11/2021  ? 11:27 PM 04/30/2021  ? 12:29 PM  ?Last 3 Weights  ?Weight (lbs) 214 lb 1.1 oz 216 lb 0.8 oz 216 lb  ?Weight (kg) 97.1 kg 98 kg 97.977 kg  ?  Body mass index is 31.61 kg/m?.  ?General: Well nourished, well developed, in no acute distress ?Head: Atraumatic, normal size  ?Eyes: PEERLA, EOMI  ?Neck: Supple, no  JVD ?Endocrine: No thryomegaly ?Cardiac: Normal S1, S2; RRR; no murmurs, rubs, or gallops ?Lungs: Clear to auscultation bilaterally, no wheezing, rhonchi or rales  ?Abd: Soft, nontender, no hepatomegaly  ?Ext: No edema, pulses 2+ ?Musculoskeletal: No deformities, BUE and BLE strength normal and equal ?Skin: Warm and dry, no rashes   ?Neuro: Alert and oriented to person, place, time, and situation, CNII-XII grossly intact, no focal deficits  ?Psych: Normal mood and affect  ? ?Labs  ?High Sensitivity Troponin:   ?Recent Labs  ?Lab 05/11/21 ?2339 05/12/21 ?0426  ?TROPONINIHS 169* 876*  ?   ?Cardiac EnzymesNo results for input(s): TROPONINI in the last 168 hours. No results for input(s): TROPIPOC in the last 168 hours.  ?Chemistry ?Recent Labs  ?Lab 05/11/21 ?2339 05/12/21 ?1245 05/13/21 ?0211  ?NA 141 143 139  ?K 4.6 4.4 4.1  ?CL 108 110 110  ?CO2 23 22 20*  ?GLUCOSE 182* 189* 157*  ?BUN 102* 100* 95*  ?CREATININE 4.54* 4.21* 4.08*  ?CALCIUM 9.8 9.7 9.5  ?PROT  --  5.9*  --   ?ALBUMIN  --  3.1*  --   ?AST  --  46*  --   ?ALT  --  25  --   ?ALKPHOS  --  39  --   ?BILITOT  --  0.7  --   ?GFRNONAA 10* 11* 11*  ?ANIONGAP  '10 11 9  '$ ?  ?Hematology ?Recent Labs  ?Lab 05/12/21 ?0426 05/12/21 ?8099 05/13/21 ?0211  ?WBC 6.1 5.6 6.1  ?RBC 3.74* 3.51* 3.48*  ?HGB 9.9* 9.2* 9.1*  ?HCT 32.8* 29.4* 29.6*  ?MCV 87.7 83.8 85.1  ?MCH 26.5 26.2 26.1  ?MCHC 30.2 31.3 30.7  ?RDW 17.4* 16.0* 15.9*  ?PLT 115* PLATELET CLUMPS NOTED ON SMEAR, UNABLE TO ESTIMATE 86*  ? ?BNPNo results for input(s): BNP, PROBNP in the last 168 hours.  ?DDimer No results for input(s): DDIMER in the last 168 hours.  ? ?Radiology  ?CARDIAC CATHETERIZATION ? ?Addendum Date: 05/12/2021   ?  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   Dist RCA lesion is 80% stenosed.   A stent was successfully placed.   A stent was successfully placed.   Post intervention, there is a 0% residual stenosis.   Post intervention, there is a 0% residual stenosis.   LV end diastolic pressure is normal. 1.  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 2.  LVEDP of 11 mmHg. Recommendations: Aggrastat infusion for 18 hours and dual antiplatelet therapy for at least 1 year.  Given the patient's chronic kidney disease this will need to be monitored over the next few days and the need for renal replacement therapy assessed. ? ?Result Date: 05/12/2021 ?  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   Dist RCA lesion is 80% stenosed.   A stent was successfully placed.   A stent was successfully placed.   Post intervention, there is a 0% residual stenosis.   Post intervention, there is a 0% residual stenosis.   LV end diastolic pressure is normal. 1.  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 2.  LVEDP of 11 mmHg. Recommendations: Aggrastat infusion for 18 hours  and dual antiplatelet therapy  for at least 1 year.  Given the patient's chronic kidney disease this will need to be monitored over the next few days and the need for renal replacement therapy assessed.  ? ?DG Chest Port 1 View ? ?Result Date: 05/12/2021 ?CLINICAL DATA:  Chest pain EXAM: PORTABLE CHEST 1 VIEW COMPARISON:  09/29/2020, 09/25/2020 FINDINGS: Moderate cardiomegaly with central congestion. Aortic atherosclerosis. No consolidation, pleural effusion, or pneumothorax. IMPRESSION: Cardiomegaly with mild central congestion similar compared to prior. Electronically Signed   By: Donavan Foil M.D.   On: 05/12/2021 00:17  ? ?ECHOCARDIOGRAM COMPLETE ? ?Result Date: 05/12/2021 ?   ECHOCARDIOGRAM REPORT   Patient Name:   [facility] Date of Exam: 05/12/2021 Medical Rec #:  [id number]         Height:       69.0 in Accession #:    [id number]        Weight:       214.1 lb Date of Birth:  [birth year]          BSA:          2.127 m? Patient Age:    4 years          BP:           148/78 mmHg Patient Gender: F                 HR:           69 bpm. Exam Location:  Inpatient Procedure: 2D Echo, Color Doppler and Cardiac Doppler Indications:    Acute myocardial infarction i21.9  History:        Patient has prior history of Echocardiogram examinations, most                 recent 04/27/2020. CHF; Risk Factors:Hypertension, Diabetes,                 Dyslipidemia and Sleep Apnea.  Sonographer:    Raquel Sarna Senior RDCS Referring Phys: [id number] [place]  1. Left ventricular ejection fraction, by estimation, is 70 to 75%. The left ventricle has hyperdynamic function. The left ventricle has no regional wall motion abnormalities. There is severe concentric left ventricular hypertrophy. Left ventricular diastolic function could not be evaluated.  2. Moderate calcific mitral valve disease is present. MG 6.3 mmHG @ 69 bpm. There is calcified chordal tissue under the MV which does demonstrate some mobility. 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 The mitral valve is degenerative. Trivial mitral valve regurgitation. Moderate mitral stenosis. The mean mitral valve gradient is 6.3 mmHg with avera

## 2021-05-14 ENCOUNTER — Encounter (HOSPITAL_COMMUNITY): Payer: Self-pay

## 2021-05-14 ENCOUNTER — Other Ambulatory Visit (HOSPITAL_COMMUNITY): Payer: Self-pay

## 2021-05-14 ENCOUNTER — Ambulatory Visit: Payer: Medicare Other | Admitting: Interventional Cardiology

## 2021-05-14 ENCOUNTER — Encounter (HOSPITAL_COMMUNITY): Payer: Self-pay | Admitting: Internal Medicine

## 2021-05-14 DIAGNOSIS — I10 Essential (primary) hypertension: Secondary | ICD-10-CM | POA: Diagnosis not present

## 2021-05-14 DIAGNOSIS — E039 Hypothyroidism, unspecified: Secondary | ICD-10-CM | POA: Diagnosis not present

## 2021-05-14 DIAGNOSIS — N186 End stage renal disease: Secondary | ICD-10-CM | POA: Diagnosis not present

## 2021-05-14 DIAGNOSIS — D696 Thrombocytopenia, unspecified: Secondary | ICD-10-CM

## 2021-05-14 DIAGNOSIS — I2111 ST elevation (STEMI) myocardial infarction involving right coronary artery: Secondary | ICD-10-CM | POA: Diagnosis not present

## 2021-05-14 LAB — CBC
HCT: 27.9 % — ABNORMAL LOW (ref 36.0–46.0)
Hemoglobin: 8.6 g/dL — ABNORMAL LOW (ref 12.0–15.0)
MCH: 26.3 pg (ref 26.0–34.0)
MCHC: 30.8 g/dL (ref 30.0–36.0)
MCV: 85.3 fL (ref 80.0–100.0)
Platelets: 71 10*3/uL — ABNORMAL LOW (ref 150–400)
RBC: 3.27 MIL/uL — ABNORMAL LOW (ref 3.87–5.11)
RDW: 15.9 % — ABNORMAL HIGH (ref 11.5–15.5)
WBC: 4.7 10*3/uL (ref 4.0–10.5)
nRBC: 0 % (ref 0.0–0.2)

## 2021-05-14 LAB — BASIC METABOLIC PANEL
Anion gap: 10 (ref 5–15)
BUN: 93 mg/dL — ABNORMAL HIGH (ref 8–23)
CO2: 20 mmol/L — ABNORMAL LOW (ref 22–32)
Calcium: 9.6 mg/dL (ref 8.9–10.3)
Chloride: 110 mmol/L (ref 98–111)
Creatinine, Ser: 4.22 mg/dL — ABNORMAL HIGH (ref 0.44–1.00)
GFR, Estimated: 11 mL/min — ABNORMAL LOW (ref >60)
Glucose, Bld: 114 mg/dL — ABNORMAL HIGH (ref 70–99)
Potassium: 4.2 mmol/L (ref 3.5–5.1)
Sodium: 140 mmol/L (ref 135–145)

## 2021-05-14 LAB — GLUCOSE, CAPILLARY
Glucose-Capillary: 128 mg/dL — ABNORMAL HIGH (ref 70–99)
Glucose-Capillary: 162 mg/dL — ABNORMAL HIGH (ref 70–99)
Glucose-Capillary: 119 mg/dL — ABNORMAL HIGH (ref 70–99)
Glucose-Capillary: 172 mg/dL — ABNORMAL HIGH (ref 70–99)

## 2021-05-14 LAB — T3: T3, Total: 72 ng/dL (ref 71–180)

## 2021-05-14 MED ORDER — ALPRAZOLAM 0.25 MG PO TABS
0.2500 mg | ORAL_TABLET | Freq: Once | ORAL | Status: AC | PRN
  Administered 2021-05-14: 0.25 mg via ORAL
  Filled 2021-05-14: qty 1

## 2021-05-14 NOTE — TOC Benefit Eligibility Note (Signed)
Patient Advocate Encounter ? ?Insurance verification completed.   ? ?The patient is currently admitted and upon discharge could be taking Brilinta 90 mg. ? ?The current 30 day co-pay is, $10.35.  ? ?The patient is insured through [employer] Part D  ? ? ? ?Lyndel Safe, CPhT ?Pharmacy Patient Advocate Specialist ?[place] Patient Advocate Team ?Direct Number: [phone]  Fax: [phone] ? ? ? ? ? ?  ?

## 2021-05-14 NOTE — Progress Notes (Addendum)
? ?Progress Note ? ?Patient Name: Tiffany [name] ?Date of Encounter: 05/14/2021 ? ?[facility] Cardiologist: Tiffany Grooms, MD  ? ?Subjective  ? ?Feeling well this morning. No chest pain.  ? ?Inpatient Medications  ?  ?Scheduled Meds: ? amLODipine  10 mg Oral Daily  ? aspirin  81 mg Oral Daily  ? atorvastatin  80 mg Oral QHS  ? calcitRIOL  0.5 mcg Oral Daily  ? carvedilol  25 mg Oral BID WC  ? cloNIDine  0.3 mg Transdermal Q Fri  ? heparin injection (subcutaneous)  5,000 Units Subcutaneous Q8H  ? hydrALAZINE  100 mg Oral Q8H  ? insulin aspart  0-15 Units Subcutaneous TID WC  ? insulin aspart  0-5 Units Subcutaneous QHS  ? levothyroxine  175 mcg Oral Q0600  ? polyvinyl alcohol  1 drop Both Eyes Daily  ? sodium chloride flush  3 mL Intravenous Q12H  ? ticagrelor  90 mg Oral BID  ? ?Continuous Infusions: ? sodium chloride    ? ?PRN Meds: ?sodium chloride, acetaminophen, nitroGLYCERIN, ondansetron (ZOFRAN) IV, sodium chloride flush, zolpidem  ? ?Vital Signs  ?  ?Vitals:  ? 05/13/21 1703 05/13/21 2100 05/14/21 0045 05/14/21 0453  ?BP: (!) 157/73 (!) 145/78 (!) 162/86 (!) 178/88  ?Pulse: 66 63 79   ?Resp:      ?Temp:  98 ?F (36.7 ?C) 98.8 ?F (37.1 ?C)   ?TempSrc:  Oral Oral   ?SpO2:  99% 99%   ?Weight:      ?Height:      ? ? ?Intake/Output Summary (Last 24 hours) at 05/14/2021 0746 ?Last data filed at 05/13/2021 1902 ?Gross per 24 hour  ?Intake 96.52 ml  ?Output --  ?Net 96.52 ml  ? ? ?  05/12/2021  ?  2:32 AM 05/11/2021  ? 11:27 PM 04/30/2021  ? 12:29 PM  ?Last 3 Weights  ?Weight (lbs) 214 lb 1.1 oz 216 lb 0.8 oz 216 lb  ?Weight (kg) 97.1 kg 98 kg 97.977 kg  ?   ? ?Telemetry  ?  ?SR, brief run of NSVT - Personally Reviewed ? ?ECG  ?  ?No new tracing this morning ? ?Physical Exam  ? ?GEN: No acute distress.   ?Neck: No JVD ?Cardiac: RRR, + systolic murmur, no rubs, or gallops.  ?Respiratory: Clear to auscultation bilaterally. ?GI: Soft, nontender, non-distended  ?MS: No edema; No deformity. Right radial cath site  stable.  ?Neuro:  Nonfocal  ?Psych: Normal affect  ? ?Labs  ?  ?High Sensitivity Troponin:   ?Recent Labs  ?Lab 05/11/21 ?2339 05/12/21 ?0426  ?TROPONINIHS 169* 876*  ?   ?Chemistry ?Recent Labs  ?Lab 05/12/21 ?5456 05/13/21 ?0211 05/14/21 ?2563  ?NA 143 139 140  ?K 4.4 4.1 4.2  ?CL 110 110 110  ?CO2 22 20* 20*  ?GLUCOSE 189* 157* 114*  ?BUN 100* 95* 93*  ?CREATININE 4.21* 4.08* 4.22*  ?CALCIUM 9.7 9.5 9.6  ?MG 2.2  --   --   ?PROT 5.9*  --   --   ?ALBUMIN 3.1*  --   --   ?AST 46*  --   --   ?ALT 25  --   --   ?ALKPHOS 65  --   --   ?BILITOT 0.7  --   --   ?GFRNONAA 11* 11* 11*  ?ANIONGAP '11 9 10  '$ ?  ?Lipids  ?Recent Labs  ?Lab 05/11/21 ?2339  ?CHOL 151  ?TRIG 51  ?HDL 45  ?[place] 96  ?CHOLHDL  3.4  ?  ?Hematology ?Recent Labs  ?Lab 05/12/21 ?4270 05/13/21 ?0211 05/14/21 ?6237  ?WBC 5.6 6.1 4.7  ?RBC 3.51* 3.48* 3.27*  ?HGB 9.2* 9.1* 8.6*  ?HCT 29.4* 29.6* 27.9*  ?MCV 83.8 85.1 85.3  ?MCH 26.2 26.1 26.3  ?MCHC 31.3 30.7 30.8  ?RDW 16.0* 15.9* 15.9*  ?PLT PLATELET CLUMPS NOTED ON SMEAR, UNABLE TO ESTIMATE 86* 71*  ? ?Thyroid  ?Recent Labs  ?Lab 05/12/21 ?0426 05/13/21 ?0211  ?TSH 0.297*  --   ?FREET4  --  1.55*  ?  ?BNPNo results for input(s): BNP, PROBNP in the last 168 hours.  ?DDimer No results for input(s): DDIMER in the last 168 hours.  ? ?Radiology  ?  ?ECHOCARDIOGRAM COMPLETE ? ?Result Date: 05/12/2021 ?   ECHOCARDIOGRAM REPORT   Patient Name:   Tiffany [name] Date of Exam: 05/12/2021 Medical Rec #:  [id number]         Height:       69.0 in Accession #:    [id number]        Weight:       214.1 lb Date of Birth:  [birth year]          BSA:          2.127 m? Patient Age:    71 years          BP:           148/78 mmHg Patient Gender: F                 HR:           69 bpm. Exam Location:  Inpatient Procedure: 2D Echo, Color Doppler and Cardiac Doppler Indications:    Acute myocardial infarction i21.9  History:        Patient has prior history of Echocardiogram examinations, most                 recent 04/27/2020. CHF;  Risk Factors:Hypertension, Diabetes,                 Dyslipidemia and Sleep Apnea.  Sonographer:    Tiffany [name] Senior RDCS Referring Phys: [id number] [place]  1. Left ventricular ejection fraction, by estimation, is 70 to 75%. The left ventricle has hyperdynamic function. The left ventricle has no regional wall motion abnormalities. There is severe concentric left ventricular hypertrophy. Left ventricular diastolic function could not be evaluated.  2. Moderate calcific mitral valve disease is present. MG 6.3 mmHG @ 69 bpm. There is calcified chordal tissue under the MV which does demonstrate some mobility. 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 The mitral valve is degenerative. Trivial mitral valve regurgitation. Moderate mitral stenosis. The mean mitral valve gradient is 6.3 mmHg with average heart rate of 69 bpm.  3. Right ventricular systolic function is normal. The right ventricular size is normal. There is mildly elevated pulmonary artery systolic pressure. The estimated right ventricular systolic pressure is 27.0 mmHg.  4. The aortic valve is tricuspid. There is moderate calcification of the aortic valve. There is moderate thickening of the aortic valve. Aortic valve regurgitation is not visualized. Mild aortic valve stenosis. Aortic valve area, by VTI measures 2.04 cm?Marland Kitchen Aortic valve mean gradient measures 11.0 mmHg. Aortic valve Vmax measures 2.36 m/s.  5. Left atrial size was severely dilated.  6. Right atrial size was mildly dilated.  7. A small pericardial effusion is present. The pericardial effusion is circumferential.  8. The inferior vena  cava is dilated in size with <50% respiratory variability, suggesting right atrial pressure of 15 mmHg. Comparison(s): No significant change from prior study. FINDINGS  Left Ventricle: Left ventricular ejection fraction, by estimation, is 70 to 75%. The left ventricle has hyperdynamic function. The left ventricle has no  regional wall motion abnormalities. The left ventricular internal cavity size was normal in size. There is severe concentric left ventricular hypertrophy. Left ventricular diastolic function could not be evaluated due to mitral annular calcification (moderate or greater). Left ventricular diastolic function could not be evaluated. The ratio of pulmonic flow to systemic flow (Qp/Qs ratio) is 0.90. Right Ventricle: The right ventricular size is normal. No increase in right ventricular wall thickness. Right ventricular systolic function is normal. There is mildly elevated pulmonary artery systolic pressure. The tricuspid regurgitant velocity is 2.52  m/s, and with an assumed right atrial pressure of 15 mmHg, the estimated right ventricular systolic pressure is 26.8 mmHg. Left Atrium: Left atrial size was severely dilated. Right Atrium: Right atrial size was mildly dilated. Pericardium: A small pericardial effusion is present. The pericardial effusion is circumferential. Mitral Valve: Moderate calcific mitral valve disease is present. MG 6.3 mmHG @ 69 bpm. There is calcified chordal tissue under the MV which does demonstrate some mobility. 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 The mitral valve is degenerative in appearance. Trivial mitral valve regurgitation. Moderate mitral valve stenosis. MV peak gradient, 14.1 mmHg. The mean mitral valve gradient is 6.3 mmHg with average heart rate of 69 bpm. Tricuspid Valve: The tricuspid valve is grossly normal. Tricuspid valve regurgitation is mild . No evidence of tricuspid stenosis. Aortic Valve: The aortic valve is tricuspid. There is moderate calcification of the aortic valve. There is moderate thickening of the aortic valve. Aortic valve regurgitation is not visualized. Mild aortic stenosis is present. Aortic valve mean gradient measures 11.0 mmHg. Aortic valve peak gradient measures 22.3 mmHg. Aortic valve area, by VTI measures 2.04 cm?.  Pulmonic Valve: The pulmonic valve was grossly normal. Pulmonic valve regurgitation is mild. No evidence of pulmonic stenosis. Aorta: The aortic root is normal in size and structure. Venous: The inferior vena c

## 2021-05-14 NOTE — Progress Notes (Signed)
CARDIAC REHAB PHASE I  ? ?PRE:  Rate/Rhythm: 72 SR ? BP: sitting 177/71 ? ?  SaO2:  ? ?MODE:  Ambulation:  280 ft  ? ?POST:  Rate/Rhythm: 109 ST ? ?  BP: sitting 200/85  ? ?  SaO2: 98 RA ? ?Pt moved out of bed independently and walked hall with standby assist. She walked quickly and became DOE, unable to talk. Rest after 140 ft for SOB. Returned to room, to recliner, very SOB. Encouraged pt to pace herself, this seemed difficult for her. BP elevated.  ? ?Discussed with pt MI, stent, Brilinta, exercise, NTG and CRPII. Pt receptive. Defer diet to RD due to need for renal diet. She sts she discussed with RD at nephrology office and has been eating lean meats, fruits and vegetables mostly. Sts no questions currently. Will refer to [facility].  ?[id number]  ? ?Yves Dill CES, ACSM ?05/14/2021 ?9:01 AM ? ? ? ? ?

## 2021-05-14 NOTE — Progress Notes (Signed)
Mobility Specialist Progress Note ? ? 05/14/21 1555  ?Mobility  ?Activity Ambulated with assistance in hallway  ?Level of Assistance Contact guard assist, steadying assist  ?Assistive Device None  ?Distance Ambulated (ft) 240 ft  ?Activity Response Tolerated well  ?$Mobility charge 1 Mobility  ? ?Pre Mobility: 72 HR ?During Mobility: 103 HR ?Post Mobility: 69 HR, 137/80 BP ? ?Received pt in chair w/ no c/o and agreeable. Pt states they do not use AD but had x1 LOB that was corrected by themselves. Pt guarding hand rail while ambulating down the hall for safety but having no complaints or faults during. Returned back to the chair w/ call bell in reach and family in room.   ? ?Holland Falling ?Mobility Specialist ?Phone Number [phone] ? ?

## 2021-05-15 DIAGNOSIS — I2111 ST elevation (STEMI) myocardial infarction involving right coronary artery: Secondary | ICD-10-CM | POA: Diagnosis not present

## 2021-05-15 DIAGNOSIS — N186 End stage renal disease: Secondary | ICD-10-CM | POA: Diagnosis not present

## 2021-05-15 DIAGNOSIS — I10 Essential (primary) hypertension: Secondary | ICD-10-CM | POA: Diagnosis not present

## 2021-05-15 DIAGNOSIS — E039 Hypothyroidism, unspecified: Secondary | ICD-10-CM | POA: Diagnosis not present

## 2021-05-15 LAB — GLUCOSE, CAPILLARY
Glucose-Capillary: 154 mg/dL — ABNORMAL HIGH (ref 70–99)
Glucose-Capillary: 125 mg/dL — ABNORMAL HIGH (ref 70–99)
Glucose-Capillary: 131 mg/dL — ABNORMAL HIGH (ref 70–99)
Glucose-Capillary: 167 mg/dL — ABNORMAL HIGH (ref 70–99)

## 2021-05-15 LAB — BASIC METABOLIC PANEL
Anion gap: 9 (ref 5–15)
BUN: 105 mg/dL — ABNORMAL HIGH (ref 8–23)
CO2: 20 mmol/L — ABNORMAL LOW (ref 22–32)
Calcium: 9.2 mg/dL (ref 8.9–10.3)
Chloride: 110 mmol/L (ref 98–111)
Creatinine, Ser: 4.66 mg/dL — ABNORMAL HIGH (ref 0.44–1.00)
GFR, Estimated: 10 mL/min — ABNORMAL LOW (ref >60)
Glucose, Bld: 137 mg/dL — ABNORMAL HIGH (ref 70–99)
Potassium: 4.7 mmol/L (ref 3.5–5.1)
Sodium: 139 mmol/L (ref 135–145)

## 2021-05-15 LAB — OCCULT BLOOD X 1 CARD TO LAB, STOOL: Fecal Occult Bld: NEGATIVE

## 2021-05-15 LAB — CBC
HCT: 25.8 % — ABNORMAL LOW (ref 36.0–46.0)
HCT: 27.8 % — ABNORMAL LOW (ref 36.0–46.0)
Hemoglobin: 8 g/dL — ABNORMAL LOW (ref 12.0–15.0)
Hemoglobin: 8.4 g/dL — ABNORMAL LOW (ref 12.0–15.0)
MCH: 26.4 pg (ref 26.0–34.0)
MCH: 26 pg (ref 26.0–34.0)
MCHC: 31 g/dL (ref 30.0–36.0)
MCHC: 30.2 g/dL (ref 30.0–36.0)
MCV: 85.1 fL (ref 80.0–100.0)
MCV: 86.1 fL (ref 80.0–100.0)
Platelets: 71 10*3/uL — ABNORMAL LOW (ref 150–400)
Platelets: 76 10*3/uL — ABNORMAL LOW (ref 150–400)
RBC: 3.03 MIL/uL — ABNORMAL LOW (ref 3.87–5.11)
RBC: 3.23 MIL/uL — ABNORMAL LOW (ref 3.87–5.11)
RDW: 16 % — ABNORMAL HIGH (ref 11.5–15.5)
RDW: 15.9 % — ABNORMAL HIGH (ref 11.5–15.5)
WBC: 4.5 10*3/uL (ref 4.0–10.5)
WBC: 4.2 10*3/uL (ref 4.0–10.5)
nRBC: 0 % (ref 0.0–0.2)
nRBC: 0 % (ref 0.0–0.2)

## 2021-05-15 MED ORDER — DARBEPOETIN ALFA 100 MCG/0.5ML IJ SOSY
100.0000 ug | PREFILLED_SYRINGE | Freq: Once | INTRAMUSCULAR | Status: AC
  Administered 2021-05-15: 100 ug via SUBCUTANEOUS
  Filled 2021-05-15: qty 0.5

## 2021-05-15 NOTE — Care Management Important Message (Signed)
Important Message ? ?Patient Details  ?Name: Tiffany [name] ?MRN: [id number] ?Date of Birth: [birth year] ? ? ?Medicare Important Message Given:  Yes ? ? ? ? ?Shelda Altes ?05/15/2021, 10:59 AM ?

## 2021-05-15 NOTE — Progress Notes (Addendum)
? ?Progress Note ? ?Patient Name: Tiffany [name] ?Date of Encounter: 05/15/2021 ? ?[facility] Cardiologist: Sinclair Grooms, MD  ? ?Subjective  ? ?No chest pain, does feel short of breath with minimal activity.  ? ?Inpatient Medications  ?  ?Scheduled Meds: ? amLODipine  10 mg Oral Daily  ? aspirin  81 mg Oral Daily  ? atorvastatin  80 mg Oral QHS  ? calcitRIOL  0.5 mcg Oral Daily  ? carvedilol  25 mg Oral BID WC  ? cloNIDine  0.3 mg Transdermal Q Fri  ? heparin injection (subcutaneous)  5,000 Units Subcutaneous Q8H  ? hydrALAZINE  100 mg Oral Q8H  ? insulin aspart  0-15 Units Subcutaneous TID WC  ? insulin aspart  0-5 Units Subcutaneous QHS  ? levothyroxine  175 mcg Oral Q0600  ? polyvinyl alcohol  1 drop Both Eyes Daily  ? sodium chloride flush  3 mL Intravenous Q12H  ? ticagrelor  90 mg Oral BID  ? ?Continuous Infusions: ? sodium chloride    ? ?PRN Meds: ?sodium chloride, acetaminophen, nitroGLYCERIN, ondansetron (ZOFRAN) IV, sodium chloride flush, zolpidem  ? ?Vital Signs  ?  ?Vitals:  ? 05/14/21 1240 05/14/21 2138 05/15/21 0354 05/15/21 0745  ?BP: (!) 150/72 (!) 146/73 (!) 125/57 (!) 152/77  ?Pulse: 62 61 (!) 55 65  ?Resp: 18   18  ?Temp: 98.5 ?F (36.9 ?C) 97.6 ?F (36.4 ?C) 98.2 ?F (36.8 ?C) 98.2 ?F (36.8 ?C)  ?TempSrc: Oral Oral Oral Oral  ?SpO2: 100%     ?Weight:   100.3 kg   ?Height:      ? ?No intake or output data in the 24 hours ending 05/15/21 0905 ? ?  05/15/2021  ?  3:54 AM 05/12/2021  ?  2:32 AM 05/11/2021  ? 11:27 PM  ?Last 3 Weights  ?Weight (lbs) 221 lb 3.2 oz 214 lb 1.1 oz 216 lb 0.8 oz  ?Weight (kg) 100.336 kg 97.1 kg 98 kg  ?   ? ?Telemetry  ?  ?SR, PVCs - Personally Reviewed ? ?ECG  ?  ?No new tracing this morning ? ?Physical Exam  ? ?GEN: No acute distress.   ?Neck: No JVD ?Cardiac: RRR, + systolic murmur, no rubs, or gallops.  ?Respiratory: Clear to auscultation bilaterally. ?GI: Soft, nontender, non-distended  ?MS: No edema; No deformity. Right radial cath site stable.  ?Neuro:  Nonfocal   ?Psych: Normal affect  ? ?Labs  ?  ?High Sensitivity Troponin:   ?Recent Labs  ?Lab 05/11/21 ?2339 05/12/21 ?0426  ?TROPONINIHS 169* 876*  ?   ?Chemistry ?Recent Labs  ?Lab 05/12/21 ?3016 05/13/21 ?0211 05/14/21 ?0109 05/15/21 ?0228  ?NA 143 139 140 139  ?K 4.4 4.1 4.2 4.7  ?CL 110 110 110 110  ?CO2 22 20* 20* 20*  ?GLUCOSE 189* 157* 114* 137*  ?BUN 100* 95* 93* 105*  ?CREATININE 4.21* 4.08* 4.22* 4.66*  ?CALCIUM 9.7 9.5 9.6 9.2  ?MG 2.2  --   --   --   ?PROT 5.9*  --   --   --   ?ALBUMIN 3.1*  --   --   --   ?AST 46*  --   --   --   ?ALT 25  --   --   --   ?ALKPHOS 65  --   --   --   ?BILITOT 0.7  --   --   --   ?GFRNONAA 11* 11* 11* 10*  ?ANIONGAP '11 9 10 9  '$ ?  ?  Lipids  ?Recent Labs  ?Lab 05/11/21 ?2339  ?CHOL 151  ?TRIG 51  ?HDL 45  ?[place] 96  ?CHOLHDL 3.4  ?  ?Hematology ?Recent Labs  ?Lab 05/13/21 ?0211 05/14/21 ?8099 05/15/21 ?0228  ?WBC 6.1 4.7 4.5  ?RBC 3.48* 3.27* 3.03*  ?HGB 9.1* 8.6* 8.0*  ?HCT 29.6* 27.9* 25.8*  ?MCV 85.1 85.3 85.1  ?MCH 26.1 26.3 26.4  ?MCHC 30.7 30.8 31.0  ?RDW 15.9* 15.9* 16.0*  ?PLT 86* 71* 71*  ? ?Thyroid  ?Recent Labs  ?Lab 05/12/21 ?0426 05/13/21 ?0211  ?TSH 0.297*  --   ?FREET4  --  1.55*  ?  ?BNPNo results for input(s): BNP, PROBNP in the last 168 hours.  ?DDimer No results for input(s): DDIMER in the last 168 hours.  ? ?Radiology  ?  ?No results found. ? ?Cardiac Studies  ? ?Cath: 05/12/21 ?  ?  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 ?  Dist RCA lesion is 80% stenosed. ?  A stent was successfully placed. ?  A stent was successfully placed. ?  Post intervention, there is a 0% residual stenosis. ?  Post intervention, there is a 0% residual stenosis. ?  LV end diastolic pressure is normal. ?  ?1.  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 ?2.  LVEDP of 11 mmHg. ?  ?Recommendations: Aggrastat  infusion for 18 hours and dual antiplatelet therapy for at least 1 year.  Given the patient's chronic kidney disease this will need to be monitored over the next few days and the need for renal replacement therapy assessed. ?  ?Diagnostic ?Dominance: Right ?Intervention ?  ?  ?Echo: 05/12/21 ?  ?IMPRESSIONS  ? ? ? 1. Left ventricular ejection fraction, by estimation, is 70 to 75%. The  ?left ventricle has hyperdynamic function. The left ventricle has no  ?regional wall motion abnormalities. There is severe concentric left  ?ventricular hypertrophy. Left ventricular  ?diastolic function could not be evaluated.  ? 2. Moderate calcific mitral valve disease is present. MG 6.3 mmHG @ 69  ?bpm. There is calcified chordal tissue under the MV which does demonstrate  ?some mobility. 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 The mitral valve is  ?degenerative. Trivial mitral valve regurgitation. Moderate mitral  ?stenosis. The mean mitral valve gradient is 6.3 mmHg with average heart  ?rate of 69 bpm.  ? 3. Right ventricular systolic function is normal. The right ventricular  ?size is normal. There is mildly elevated pulmonary artery systolic  ?pressure. The estimated right ventricular systolic pressure is 83.3 mmHg.  ? 4. The aortic valve is tricuspid. There is moderate calcification of the  ?aortic valve. There is moderate thickening of the aortic valve. Aortic  ?valve regurgitation is not visualized. Mild aortic valve stenosis. Aortic  ?valve area, by VTI measures 2.04  ?cm?Marland Kitchen Aortic valve mean gradient measures 11.0 mmHg. Aortic valve Vmax  ?measures 2.36 m/s.  ? 5. Left atrial size was severely dilated.  ? 6. Right atrial size was mildly dilated.  ? 7. A small pericardial effusion is present. The pericardial effusion is  ?circumferential.  ? 8. The inferior vena cava is dilated in size with <50% respiratory  ?variability, suggesting right atrial pressure of 15 mmHg.  ? ?Comparison(s): No  significant change from prior study.  ? ?FINDINGS  ? Left  Ventricle: Left ventricular ejection fraction, by estimation, is 70  ?to 75%. The left ventricle has hyperdynamic function. The left ventricle  ?has no regional wall motion abnormalities. The left ventricular internal  ?cavity size was normal in size.  ?There is severe concentric left ventricular hypertrophy. Left ventricular  ?diastolic function could not be evaluated due to mitral annular  ?calcification (moderate or greater). Left ventricular diastolic function  ?could not be evaluated. The ratio of  ?pulmonic flow to systemic flow (Qp/Qs ratio) is 0.90.  ? ?Right Ventricle: The right ventricular size is normal. No increase in  ?right ventricular wall thickness. Right ventricular systolic function is  ?normal. There is mildly elevated pulmonary artery systolic pressure. The  ?tricuspid regurgitant velocity is 2.52  ? m/s, and with an assumed right atrial pressure of 15 mmHg, the estimated  ?right ventricular systolic pressure is 95.6 mmHg.  ? ?Left Atrium: Left atrial size was severely dilated.  ? ?Right Atrium: Right atrial size was mildly dilated.  ? ?Pericardium: A small pericardial effusion is present. The pericardial  ?effusion is circumferential.  ? ?Mitral Valve: Moderate calcific mitral valve disease is present. MG 6.3  ?mmHG @ 69 bpm. There is calcified chordal tissue under the MV which does  ?demonstrate some mobility. Suspect this is related to degerative MV  ?disease and not endocarditis. The mitral  ?valve is degenerative in appearance. Trivial mitral valve regurgitation.  ?Moderate mitral valve stenosis. MV peak gradient, 14.1 mmHg. The mean  ?mitral valve gradient is 6.3 mmHg with average heart rate of 69 bpm.  ? ?Tricuspid Valve: The tricuspid valve is grossly normal. Tricuspid valve  ?regurgitation is mild . No evidence of tricuspid stenosis.  ? ?Aortic Valve: The aortic valve is tricuspid. There is moderate  ?calcification of the aortic  valve. There is moderate thickening of the  ?aortic valve. Aortic valve regurgitation is not visualized. Mild aortic  ?stenosis is present. Aortic valve mean gradient  ?measures 11.0 mmHg. Aortic valve peak gra

## 2021-05-15 NOTE — Progress Notes (Signed)
?  Mobility Specialist Criteria Algorithm Info. ? ? ? 05/15/21 1600  ?Mobility  ?Activity Ambulated with assistance in hallway  ?Range of Motion/Exercises Active;All extremities  ?Level of Assistance Standby assist, set-up cues, supervision of patient - no hands on  ?Assistive Device None  ?Distance Ambulated (ft) 360 ft  ?Activity Response Tolerated well  ? ?Patient received in chair eager to participate in mobility. Ambulated in hallway supervision level with slow steady gait. Required standing rest x1 and seated rest x1 secondary to fatigue. Returned to room without incident and was left with all needs met, call bell in reach. RN present.  ? ?05/15/2021 ?5:21 PM ? ?Martin[place] Ousman Dise, CMS, BS EXP ?Acute Rehabilitation Services  ?BEQUH:[phone] ?Office: [phone] ? ?

## 2021-05-16 ENCOUNTER — Telehealth (HOSPITAL_COMMUNITY): Payer: Self-pay

## 2021-05-16 DIAGNOSIS — E039 Hypothyroidism, unspecified: Secondary | ICD-10-CM | POA: Diagnosis not present

## 2021-05-16 DIAGNOSIS — N186 End stage renal disease: Secondary | ICD-10-CM | POA: Diagnosis not present

## 2021-05-16 DIAGNOSIS — I2111 ST elevation (STEMI) myocardial infarction involving right coronary artery: Secondary | ICD-10-CM | POA: Diagnosis not present

## 2021-05-16 DIAGNOSIS — I10 Essential (primary) hypertension: Secondary | ICD-10-CM | POA: Diagnosis not present

## 2021-05-16 LAB — GLUCOSE, CAPILLARY
Glucose-Capillary: 128 mg/dL — ABNORMAL HIGH (ref 70–99)
Glucose-Capillary: 139 mg/dL — ABNORMAL HIGH (ref 70–99)
Glucose-Capillary: 146 mg/dL — ABNORMAL HIGH (ref 70–99)
Glucose-Capillary: 157 mg/dL — ABNORMAL HIGH (ref 70–99)

## 2021-05-16 LAB — CBC
HCT: 24.5 % — ABNORMAL LOW (ref 36.0–46.0)
Hemoglobin: 7.6 g/dL — ABNORMAL LOW (ref 12.0–15.0)
MCH: 26.5 pg (ref 26.0–34.0)
MCHC: 31 g/dL (ref 30.0–36.0)
MCV: 85.4 fL (ref 80.0–100.0)
Platelets: 65 10*3/uL — ABNORMAL LOW (ref 150–400)
RBC: 2.87 MIL/uL — ABNORMAL LOW (ref 3.87–5.11)
RDW: 15.6 % — ABNORMAL HIGH (ref 11.5–15.5)
WBC: 3.7 10*3/uL — ABNORMAL LOW (ref 4.0–10.5)
nRBC: 0 % (ref 0.0–0.2)

## 2021-05-16 LAB — BASIC METABOLIC PANEL
Anion gap: 10 (ref 5–15)
BUN: 110 mg/dL — ABNORMAL HIGH (ref 8–23)
CO2: 20 mmol/L — ABNORMAL LOW (ref 22–32)
Calcium: 9.3 mg/dL (ref 8.9–10.3)
Chloride: 109 mmol/L (ref 98–111)
Creatinine, Ser: 4.88 mg/dL — ABNORMAL HIGH (ref 0.44–1.00)
GFR, Estimated: 9 mL/min — ABNORMAL LOW (ref >60)
Glucose, Bld: 105 mg/dL — ABNORMAL HIGH (ref 70–99)
Potassium: 4.5 mmol/L (ref 3.5–5.1)
Sodium: 139 mmol/L (ref 135–145)

## 2021-05-16 LAB — FERRITIN: Ferritin: 823 ng/mL — ABNORMAL HIGH (ref 11–307)

## 2021-05-16 LAB — IRON AND TIBC
Iron: 65 ug/dL (ref 28–170)
Saturation Ratios: 30 % (ref 10.4–31.8)
TIBC: 214 ug/dL — ABNORMAL LOW (ref 250–450)
UIBC: 149 ug/dL

## 2021-05-16 MED ORDER — ALPRAZOLAM 0.25 MG PO TABS
0.2500 mg | ORAL_TABLET | Freq: Every evening | ORAL | Status: AC | PRN
  Administered 2021-05-16: 0.25 mg via ORAL
  Filled 2021-05-16: qty 1

## 2021-05-16 NOTE — Progress Notes (Addendum)
? ?Progress Note ? ?Patient Name: Joanna Borawski ?Date of Encounter: 05/16/2021 ? ?[facility] Cardiologist: Sinclair Grooms, MD  ? ?Subjective  ? ?Doesn't feel well this morning. No energy to get up and wash up. No chest pain. Reports UOP has been ok. Intermittently short of breath.  ? ?Inpatient Medications  ?  ?Scheduled Meds: ? amLODipine  10 mg Oral Daily  ? aspirin  81 mg Oral Daily  ? atorvastatin  80 mg Oral QHS  ? calcitRIOL  0.5 mcg Oral Daily  ? carvedilol  25 mg Oral BID WC  ? cloNIDine  0.3 mg Transdermal Q Fri  ? heparin injection (subcutaneous)  5,000 Units Subcutaneous Q8H  ? hydrALAZINE  100 mg Oral Q8H  ? insulin aspart  0-15 Units Subcutaneous TID WC  ? insulin aspart  0-5 Units Subcutaneous QHS  ? levothyroxine  175 mcg Oral Q0600  ? polyvinyl alcohol  1 drop Both Eyes Daily  ? sodium chloride flush  3 mL Intravenous Q12H  ? ticagrelor  90 mg Oral BID  ? ?Continuous Infusions: ? sodium chloride    ? ?PRN Meds: ?sodium chloride, acetaminophen, nitroGLYCERIN, ondansetron (ZOFRAN) IV, sodium chloride flush, zolpidem  ? ?Vital Signs  ?  ?Vitals:  ? 05/15/21 1701 05/15/21 2123 05/16/21 0500 05/16/21 0752  ?BP: (!) 153/62 135/70 138/67 (!) 166/71  ?Pulse: 60  (!) 58 63  ?Resp:  '16 16 16  '$ ?Temp:  98 ?F (36.7 ?C) 98.1 ?F (36.7 ?C) 98.2 ?F (36.8 ?C)  ?TempSrc:  Oral Oral Oral  ?SpO2:  97% 98% 100%  ?Weight:   101 kg   ?Height:      ? ?No intake or output data in the 24 hours ending 05/16/21 0931 ? ?  05/16/2021  ?  5:00 AM 05/15/2021  ?  3:54 AM 05/12/2021  ?  2:32 AM  ?Last 3 Weights  ?Weight (lbs) 222 lb 10.6 oz 221 lb 3.2 oz 214 lb 1.1 oz  ?Weight (kg) 101 kg 100.336 kg 97.1 kg  ?   ? ?Telemetry  ?  ?SR - Personally Reviewed ? ?ECG  ?  ?No new tracing this morning ? ?Physical Exam  ? ?GEN: No acute distress.   ?Neck: No JVD ?Cardiac: RRR, + systolic murmur LLSB, no rubs, or gallops.  ?Respiratory: Clear to auscultation bilaterally. ?GI: Soft, nontender, non-distended  ?MS: No edema; No  deformity. ?Neuro:  Nonfocal  ?Psych: Normal affect  ? ?Labs  ?  ?High Sensitivity Troponin:   ?Recent Labs  ?Lab 05/11/21 ?2339 05/12/21 ?0426  ?TROPONINIHS 169* 876*  ?   ?Chemistry ?Recent Labs  ?Lab 05/12/21 ?0539 05/13/21 ?0211 05/14/21 ?7673 05/15/21 ?4193 05/16/21 ?7902  ?NA 143   < > 140 139 139  ?K 4.4   < > 4.2 4.7 4.5  ?CL 110   < > 110 110 109  ?CO2 22   < > 20* 20* 20*  ?GLUCOSE 189*   < > 114* 137* 105*  ?BUN 100*   < > 93* 105* 110*  ?CREATININE 4.21*   < > 4.22* 4.66* 4.88*  ?CALCIUM 9.7   < > 9.6 9.2 9.3  ?MG 2.2  --   --   --   --   ?PROT 5.9*  --   --   --   --   ?ALBUMIN 3.1*  --   --   --   --   ?AST 46*  --   --   --   --   ?  ALT 25  --   --   --   --   ?ALKPHOS 65  --   --   --   --   ?BILITOT 0.7  --   --   --   --   ?GFRNONAA 11*   < > 11* 10* 9*  ?ANIONGAP 11   < > '10 9 10  '$ ? < > = values in this interval not displayed.  ?  ?Lipids  ?Recent Labs  ?Lab 05/11/21 ?2339  ?CHOL 151  ?TRIG 51  ?HDL 45  ?[place] 96  ?CHOLHDL 3.4  ?  ?Hematology ?Recent Labs  ?Lab 05/15/21 ?0228 05/15/21 ?1427 05/16/21 ?7564  ?WBC 4.5 4.2 3.7*  ?RBC 3.03* 3.23* 2.87*  ?HGB 8.0* 8.4* 7.6*  ?HCT 25.8* 27.8* 24.5*  ?MCV 85.1 86.1 85.4  ?MCH 26.4 26.0 26.5  ?MCHC 31.0 30.2 31.0  ?RDW 16.0* 15.9* 15.6*  ?PLT 71* 76* 65*  ? ?Thyroid  ?Recent Labs  ?Lab 05/12/21 ?0426 05/13/21 ?0211  ?TSH 0.297*  --   ?FREET4  --  1.55*  ?  ?BNPNo results for input(s): BNP, PROBNP in the last 168 hours.  ?DDimer No results for input(s): DDIMER in the last 168 hours.  ? ?Radiology  ?  ?No results found. ? ?Cardiac Studies  ? ?Cath: 05/12/21 ?  ?  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 ?  Dist RCA lesion is 80% stenosed. ?  A stent was successfully placed. ?  A stent was successfully placed. ?  Post intervention, there is a 0% residual stenosis. ?  Post intervention, there is a 0% residual stenosis. ?  LV end diastolic pressure is normal. ?  ?1.  100% occlusion of posterior descending artery treated with 2 overlapping drug-eluting stents; thrombotic  occlusion noted during procedure at the crux treated with 1 stent from the distal right coronary artery into the ostium of the PDA. ?2.  LVEDP of 11 mmHg. ?  ?Recommendations: Aggrastat infusion for 18 hours and dual antiplatelet therapy for at least 1 year.  Given the patient's chronic kidney disease this will need to be monitored over the next few days and the need for renal replacement therapy assessed. ?  ?Diagnostic ?Dominance: Right ?Intervention ?  ?  ?Echo: 05/12/21 ?  ?IMPRESSIONS  ? 1. Left ventricular ejection fraction, by estimation, is 70 to 75%. The  ?left ventricle has hyperdynamic function. The left ventricle has no  ?regional wall motion abnormalities. There is severe concentric left  ?ventricular hypertrophy. Left ventricular  ?diastolic function could not be evaluated.  ? 2. Moderate calcific mitral valve disease is present. MG 6.3 mmHG @ 69  ?bpm. There is calcified chordal tissue under the MV which does demonstrate  ?some mobility. 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 The mitral valve is  ?degenerative. Trivial mitral valve regurgitation. Moderate mitral  ?stenosis. The mean mitral valve gradient is 6.3 mmHg with average heart  ?rate of 69 bpm.  ? 3. Right ventricular systolic function is normal. The right ventricular  ?size is normal. There is mildly elevated pulmonary artery systolic  ?pressure. The estimated right ventricular systolic pressure is 33.2 mmHg.  ? 4. The aortic valve is tricuspid. There is moderate calcification of the  ?aortic valve. There is moderate thickening of the aortic valve. Aortic  ?valve regurgitation is not visualized. Mild aortic valve stenosis. Aortic  ?valve area, by VTI measures 2.04  ?cm?Marland Kitchen Aortic valve mean gradient measures 11.0 mmHg. Aortic valve Vmax  ?  measures 2.36 m/s.  ? 5. Left atrial size was severely dilated.  ? 6. Right atrial size was mildly dilated.  ? 7. A small pericardial effusion is present. The pericardial  effusion is  ?circumferential.  ? 8. The inferior vena cava is dilated in size with <50% respiratory  ?variability, suggesting right atrial pressure of 15 mmHg.  ? ?Comparison(s): No significant change from prior study.  ? ?FINDINGS  ? Left Ventricle: Left ventricular ejection fraction, by estimation, is 70  ?to 75%. The left ventricle has hyperdynamic function. The left ventricle  ?has no regional wall motion abnormalities. The left ventricular internal  ?cavity size was normal in size.  ?There is severe concentric left ventricular hypertrophy. Left ventricular  ?diastolic function could not be evaluated due to mitral annular  ?calcification (moderate or greater). Left ventricular diastolic function  ?could not be evaluated. The ratio of  ?pulmonic flow to systemic flow (Qp/Qs ratio) is 0.90.  ? ?Right Ventricle: The right ventricular size is normal. No increase in  ?right ventricular wall thickness. Right ventricular systolic function is  ?normal. There is mildly elevated pulmonary artery systolic pressure. The  ?tricuspid regurgitant velocity is 2.52  ? m/s, and with an assumed right atrial pressure of 15 mmHg, the estimated  ?right ventricular systolic pressure is 10.2 mmHg.  ? ?Left Atrium: Left atrial size was severely dilated.  ? ?Right Atrium: Right atrial size was mildly dilated.  ? ?Pericardium: A small pericardial effusion is present. The pericardial  ?effusion is circumferential.  ? ?Mitral Valve: Moderate calcific mitral valve disease is present. MG 6.3  ?mmHG @ 69 bpm. There is calcified chordal tissue under the MV which does  ?demonstrate some mobility. Suspect this is related to degerative MV  ?disease and not endocarditis. The mitral  ?valve is degenerative in appearance. Trivial mitral valve regurgitation.  ?Moderate mitral valve stenosis. MV peak gradient, 14.1 mmHg. The mean  ?mitral valve gradient is 6.3 mmHg with average heart rate of 69 bpm.  ? ?Tricuspid Valve: The tricuspid valve is grossly  normal. Tricuspid valve  ?regurgitation is mild . No evidence of tricuspid stenosis.  ? ?Aortic Valve: The aortic valve is tricuspid. There is moderate  ?calcification of the aortic valve. There is moderate thickening of

## 2021-05-16 NOTE — Telephone Encounter (Signed)
Pt insurance is active and benefits verified through [facility] Medicare Co-pay 0, DED 0/0 met, out of pocket $3,600/$957.85 met, co-insurance 0%. no pre-authorization required. Passport, 05/16/2021@3 :33pm, REF# [id number] ? ?Will contact patient to see if she is interested in the Cardiac Rehab Program. If interested, patient will need to complete follow up appt. Once completed, patient will be contacted for scheduling upon review by the RN Navigator. ?

## 2021-05-16 NOTE — Progress Notes (Signed)
?[facility] ?Progress Note  ? ? 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 UOP post-cath has been adequate. Kidney function has been relatively stable with only slight worsening of GFR.  Follows Dr. Johnney Ou in the outpatient setting. Would not like to pursue dialysis. ?-baseline GFR ~10-12, this morning GFR 9 ?-BUN increased to 110, no signs of uremic encephalopathy or asterixis on my exam ?-f/u C3 levels ?-Strict I/O's, daily weights ?-trend renal function ?-avoid nephrotoxic medications (aside from baby ASA) ?-no indication for HD at this time, but patient would not like to pursue HD in the future either ? ?2. Inferior STEMI s/p PCI - management per cardiology ? ?3. T2DM with hyperglycemia ?-Last A1c 6.7% ?-on SSI ?-CBGs well controlled ? ?4. Normocytic Anemia ?-Jehovah's Witness - AVOID blood transfusions unless patient gives consent ?-Hgb has been gradually downtrending since PCI [9.9 > 9.1 > 8.6 > 8.0 > 7.6 this AM] ?-no overt signs of bleeding ?-on ASA and brillinta given stent placement ?-iron studies on 3/21 not consistent with iron deficiency, but will recheck today ?-was on ESA in the outpatient setting (Hgb up to 11-12 at that time), last infusion about 3 weeks ago ?-received dose of aranesp yesterday ? ?5. Hypothyroidism - on synthroid per primary ? ?6. HTN ?-fairly well controlled  ?-on coreg, norvasc, clonidine patch, and hydralazine ? ?7. HLD ?-on statin ? ?Subjective:   ? ?Evaluated patient at bedside. States that she feels okay but did not have a good night. Was not able to sleep well. Feels more tired today, wondering if it is from her hemoglobin being lower. Also getting short of breath with moving short distances, having to take frequent breaks.  ? ?States she was able to urinate about 2-3 times since last night. ? ?She is worried about her quality of life going forward. Has a lot of stressors, especially not being able to  work. 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 She is frustrated that she cannot go back to work due to her medical conditions. ? ?She states that she does not want to go on dialysis, even in the future, because she would just be surviving without having any benefit in her quality of life. Does not want to consider it at this time.   ? ?Objective:   ?BP (!) 166/71 (BP Location: Left Arm)   Pulse 63   Temp 98.2 ?F (36.8 ?C) (Oral)   Resp 16   Ht '5\' 9"'$  (1.753 m)   Wt 101 kg   SpO2 100%   BMI 32.88 kg/m?  ?No intake or output data in the 24 hours ending 05/16/21 1027 ?Weight change: 0.664 kg ? ?Physical Exam: ?Gen: obese elderly female, sitting up in bed, NAD. ?CVS: normal rate and regular rhythm, + systolic murmur. ?Resp: CTABL, normal work of breathing on RA. ?Abd: soft, nontender, nondistended, normoactive bowel sounds. ?Ext: trace edema in bilateral LE (R > L), no calf tenderness ?Neuro: AAOx4, no focal deficits, no asterixis on exam. ? ?Imaging: ?No results found. ? ?Labs: ?BMET ?Recent Labs  ?Lab 05/11/21 ?2339 05/12/21 ?0347 05/13/21 ?0211 05/14/21 ?4259 05/15/21 ?5638 05/16/21 ?7564  ?NA 141 143 139 140 139 139  ?K 4.6 4.4 4.1 4.2 4.7 4.5  ?CL 108 110 110 110 110 109  ?CO2 23 22 20* 20* 20* 20*  ?GLUCOSE 182* 189* 157* 114* 137* 105*  ?BUN 102* 100*  95* 93* 105* 110*  ?CREATININE 4.54* 4.21* 4.08* 4.22* 4.66* 4.88*  ?CALCIUM 9.8 9.7 9.5 9.6 9.2 9.3  ? ?CBC ?Recent Labs  ?Lab 05/11/21 ?2339 05/12/21 ?0426 05/14/21 ?9983 05/15/21 ?0228 05/15/21 ?1427 05/16/21 ?3825  ?WBC 5.0   < > 4.7 4.5 4.2 3.7*  ?NEUTROABS 2.5  --   --   --   --   --   ?HGB 9.8*   < > 8.6* 8.0* 8.4* 7.6*  ?HCT 32.5*   < > 27.9* 25.8* 27.8* 24.5*  ?MCV 86.0   < > 85.3 85.1 86.1 85.4  ?PLT 108*   < > 71* 71* 76* 65*  ? < > = values in this interval not displayed.  ? ? ?Medications:   ? ? amLODipine  10 mg Oral Daily  ? aspirin  81 mg Oral Daily  ? atorvastatin  80 mg Oral QHS  ? calcitRIOL  0.5 mcg Oral Daily  ?  carvedilol  25 mg Oral BID WC  ? cloNIDine  0.3 mg Transdermal Q Fri  ? heparin injection (subcutaneous)  5,000 Units Subcutaneous Q8H  ? hydrALAZINE  100 mg Oral Q8H  ? insulin aspart  0-15 Units Subcutaneous TID WC  ? insulin aspart  0-5 Units Subcutaneous QHS  ? levothyroxine  175 mcg Oral Q0600  ? polyvinyl alcohol  1 drop Both Eyes Daily  ? sodium chloride flush  3 mL Intravenous Q12H  ? ticagrelor  90 mg Oral BID  ? ? ?Virl Axe, MD ?05/16/2021, 10:27 AM  ? ?

## 2021-05-16 NOTE — Progress Notes (Signed)
?  Mobility Specialist Criteria Algorithm Info. ? ? ? 05/16/21 1530  ?Mobility  ?Activity Ambulated with assistance in hallway ?(in chair before and after ambulation)  ?Range of Motion/Exercises Active;All extremities  ?Level of Assistance Standby assist, set-up cues, supervision of patient - no hands on  ?Assistive Device Front wheel walker  ?Distance Ambulated (ft) 400 ft  ?Activity Response Tolerated well  ? ?Patient received in chair eager to participate in mobility. Ambulated in hallway with slow gait requiring x3 standing rest breaks second to fatigue. Returned to room without complaint or incident. Was left with all needs met, call bell in reach. ? ?05/16/2021 ?4:51 PM ? ?[place] Scot Shiraishi, CMS, BS EXP ?Acute Rehabilitation Services  ?HGDJM:[phone] ?Office: [phone] ? ?

## 2021-05-16 NOTE — Telephone Encounter (Signed)
Called patient to see if she is interested in the Cardiac Rehab Program. Patient expressed interest. Explained scheduling process and went over insurance, patient verbalized understanding. Will contact patient for scheduling once f/u has been completed. 

## 2021-05-17 DIAGNOSIS — I10 Essential (primary) hypertension: Secondary | ICD-10-CM | POA: Diagnosis not present

## 2021-05-17 DIAGNOSIS — N186 End stage renal disease: Secondary | ICD-10-CM | POA: Diagnosis not present

## 2021-05-17 DIAGNOSIS — E039 Hypothyroidism, unspecified: Secondary | ICD-10-CM | POA: Diagnosis not present

## 2021-05-17 DIAGNOSIS — I2111 ST elevation (STEMI) myocardial infarction involving right coronary artery: Secondary | ICD-10-CM | POA: Diagnosis not present

## 2021-05-17 LAB — BASIC METABOLIC PANEL
Anion gap: 8 (ref 5–15)
BUN: 112 mg/dL — ABNORMAL HIGH (ref 8–23)
CO2: 19 mmol/L — ABNORMAL LOW (ref 22–32)
Calcium: 9.3 mg/dL (ref 8.9–10.3)
Chloride: 109 mmol/L (ref 98–111)
Creatinine, Ser: 4.93 mg/dL — ABNORMAL HIGH (ref 0.44–1.00)
GFR, Estimated: 9 mL/min — ABNORMAL LOW (ref >60)
Glucose, Bld: 120 mg/dL — ABNORMAL HIGH (ref 70–99)
Potassium: 4.6 mmol/L (ref 3.5–5.1)
Sodium: 136 mmol/L (ref 135–145)

## 2021-05-17 LAB — CBC
HCT: 24.5 % — ABNORMAL LOW (ref 36.0–46.0)
Hemoglobin: 7.7 g/dL — ABNORMAL LOW (ref 12.0–15.0)
MCH: 26.6 pg (ref 26.0–34.0)
MCHC: 31.4 g/dL (ref 30.0–36.0)
MCV: 84.5 fL (ref 80.0–100.0)
Platelets: 74 10*3/uL — ABNORMAL LOW (ref 150–400)
RBC: 2.9 MIL/uL — ABNORMAL LOW (ref 3.87–5.11)
RDW: 15.8 % — ABNORMAL HIGH (ref 11.5–15.5)
WBC: 4.2 10*3/uL (ref 4.0–10.5)
nRBC: 0 % (ref 0.0–0.2)

## 2021-05-17 LAB — GLUCOSE, CAPILLARY
Glucose-Capillary: 109 mg/dL — ABNORMAL HIGH (ref 70–99)
Glucose-Capillary: 160 mg/dL — ABNORMAL HIGH (ref 70–99)
Glucose-Capillary: 170 mg/dL — ABNORMAL HIGH (ref 70–99)
Glucose-Capillary: 148 mg/dL — ABNORMAL HIGH (ref 70–99)

## 2021-05-17 LAB — C3 COMPLEMENT: C3 Complement: 104 mg/dL (ref 82–167)

## 2021-05-17 NOTE — Progress Notes (Signed)
Pt sitting at window with SCDs on. Declined ambulation right now as awaiting lunch and plans to walk with MT after she eats. Pt talkative and pleasant. Seems to be feeling well. ?1410-1435 ?Yves Dill CES, ACSM ?/3:07 PM ?05/17/2021 ? ?

## 2021-05-17 NOTE — TOC Initial Note (Signed)
Transition of Care (TOC) - Initial/Assessment Note  ? ? ?Patient Details  ?Name: Tiffany [name] ?MRN: [id number] ?Date of Birth: [birth year] ? ?Transition of Care (TOC) CM/SW Contact:    ?Tom-Johnson, Renea Ee, RN ?Phone Number: ?05/17/2021, 12:12 PM ? ?Clinical Narrative:                 ? ?CM spoke with patient at bedside about needs for post hospital transition. Admitted for STEMI. Had two stents placed. 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 Hbg today is 7.7. Creatinine at 4.93. Possible Hematology consult. Started on Brilinta and coupon given to patient at bedside. Patient has medical insurance and states she can pay co-pay.  ?Patient is from home alone. Has two children whom she states are not supportive. Independent with care and drives self. Has a walker, cane and wheelchair at home.  ?PCP is Nolene Ebbs, MD [facility] [address].  ?No PT f/u noted. States friend will transport at discharge. CM will continue to follow with needs. ? ?Expected Discharge Plan: Home/Self Care ?Barriers to Discharge: Continued Medical Work up ? ? ?Patient Goals and CMS Choice ?Patient states their goals for this hospitalization and ongoing recovery are:: To return home ?CMS Medicare.gov Compare Post Acute Care list provided to:: Patient ?Choice offered to / list presented to : NA ? ?Expected Discharge Plan and Services ?Expected Discharge Plan: Home/Self Care ?  ?Discharge Planning Services: CM Consult, Medication Assistance (Brilinta) ?Post Acute Care Choice: NA ?Living arrangements for the past 2 months: [place] ?                ?DME Arranged: N/A ?DME Agency: NA ?  ?  ?  ?HH Arranged: NA ?[place] Agency: NA ?  ?  ?  ? ?Prior Living Arrangements/Services ?Living arrangements for the past 2 months: [place] ?Lives with:: Self ?Patient language and need for interpreter reviewed:: Yes ?Do you feel safe going back to the place where you live?: Yes      ?Need for  Family Participation in Patient Care: Yes (Comment) ?Care giver support system in place?: Yes (comment) ?Current home services: DME ([place], walker, wheelchair) ?Criminal Activity/Legal Involvement Pertinent to Current Situation/Hospitalization: No - Comment as needed ? ?Activities of Daily Living ?Home Assistive Devices/Equipment: CPAP ?ADL Screening (condition at time of admission) ?Patient's cognitive ability adequate to safely complete daily activities?: Yes ?Is the patient deaf or have difficulty hearing?: No ?Does the patient have difficulty seeing, even when wearing glasses/contacts?: No ?Does the patient have difficulty concentrating, remembering, or making decisions?: No ?Patient able to express need for assistance with ADLs?: Yes ?Does the patient have difficulty dressing or bathing?: No ?Independently performs ADLs?: Yes (appropriate for developmental age) ?Does the patient have difficulty walking or climbing stairs?: Yes ?Weakness of Legs: Both ?Weakness of Arms/Hands: None ? ?Permission Sought/Granted ?Permission sought to share information with : Case Manager, Family Supports ?Permission granted to share information with : Yes, Verbal Permission Granted ?   ?   ?   ?   ? ?Emotional Assessment ?Appearance:: Appears stated age ?Attitude/Demeanor/Rapport: Engaged, Gracious ?Affect (typically observed): Accepting, Appropriate, Calm, Hopeful ?Orientation: : Oriented to Self, Oriented to Place, Oriented to  Time, Oriented to Situation ?Alcohol / Substance Use: Not Applicable ?Psych Involvement: No (comment) ? ?Admission diagnosis:  STEMI (ST elevation myocardial infarction) ([place]) [I21.3] ?Patient Active Problem List  ? Diagnosis Date Noted  ? STEMI (ST elevation myocardial infarction) ([place]) 05/12/2021  ?  Volume overload 09/30/2020  ? Symptomatic anemia 09/29/2020  ? ESRD (end stage renal disease) ([place]) 06/04/2020  ? Acute on chronic diastolic (congestive) heart failure ([place]) 04/26/2020  ? Type 2 diabetes  mellitus with stage 5 chronic kidney disease ([place]) 04/26/2020  ? Acute CHF (congestive heart failure) ([place]) 02/01/2020  ? S/P left TKA 07/01/2019  ? Status post total left knee replacement 07/01/2019  ? Hyperlipidemia 09/12/2016  ? Type 2 diabetes mellitus without complications ([place]) 29/93/7169  ? Tachycardia 08/26/2016  ? Palpitation 08/26/2016  ? SOB (shortness of breath) 08/26/2016  ? Chronic kidney disease, stage V ([place]) 04/28/2014  ? Chronic diastolic heart failure ([place]) 05/05/2013  ? Essential hypertension 05/05/2013  ? HEART MURMUR, SYSTOLIC 67/89/3810  ? Hypothyroidism 02/12/2007  ? OBESITY 01/08/2007  ? Obstructive sleep apnea 01/08/2007  ? Seasonal and perennial allergic rhinitis 01/08/2007  ? ?PCP:  Nolene Ebbs, MD ?Pharmacy:   ?[id number] [place], [place] - [id number]?Phone: [phone]: [phone]? ? ? ? ?Social Determinants of Health (SDOH) Interventions ?  ? ?Readmission Risk Interventions ? ?  10/04/2020  ?  2:08 PM 05/02/2020  ? 12:17 PM  ?Readmission Risk Prevention Plan  ?Transportation Screening Complete Complete  ?PCP or Specialist Appt within 3-5 Days Complete Complete  ?Social Work Consult for RGarrettPlanning/Counseling  Complete  ?Palliative Care Screening Not Applicable Not Applicable  ?Medication Review (Press photographer  Complete  ? ? ? ?

## 2021-05-17 NOTE — Progress Notes (Signed)
Mobility Specialist Criteria Algorithm Info. ? ? 05/17/21 1605  ?Mobility  ?Activity Ambulated with assistance in hallway ?(in chair before and after ambulation)  ?Range of Motion/Exercises Active;All extremities  ?Level of Assistance Standby assist, set-up cues, supervision of patient - no hands on  ?Assistive Device Front wheel walker  ?Distance Ambulated (ft) 480 ft  ?Activity Response Tolerated well  ? ?Patient received in chair eager to participate in mobility. Ambulated in hallway with slow steady gait. Required standing rest break x2 second to fatigue. Returned to room without complaint or incident. Was left in chair with all needs met, call bell in reach. ? ?05/17/2021 ?4:33 PM ? ?Tiffany [name], CMS, BS EXP ?Acute Rehabilitation Services  ?BEEFE:[phone] ?Office: [phone] ? ?

## 2021-05-17 NOTE — Progress Notes (Signed)
? ?  With pt's low platelets, low Hgb and need for ASA and Brilinta will stop subcu heparin for VTE prophylaxis and use support stocking and SCDs. Discussed with Dr. Claiborne Billings.  ? ?Cecilie Kicks, FNP-C ?At [place]  ?[phone] or after 5pm and on weekends call [phone] ?05/17/2021.   ?

## 2021-05-17 NOTE — Care Management Important Message (Signed)
Important Message ? ?Patient Details  ?Name: Tiffany [name] ?MRN: [id number] ?Date of Birth: 1950/07/13 ? ? ?Medicare Important Message Given:  Yes ? ? ? ? ?Shelda Altes ?05/17/2021, 9:57 AM ?

## 2021-05-17 NOTE — Progress Notes (Addendum)
? ?Progress Note ? ?Patient Name: Tiffany [name] ?Date of Encounter: 05/17/2021 ? ?[facility] Cardiologist: Sinclair Grooms, MD  ? ?Subjective  ? ?No complaints, flat in bed.  No chest pain and no SOB but she does have room spinning dizziness when she lies back.  ? ?Inpatient Medications  ?  ?Scheduled Meds: ? amLODipine  10 mg Oral Daily  ? aspirin  81 mg Oral Daily  ? atorvastatin  80 mg Oral QHS  ? calcitRIOL  0.5 mcg Oral Daily  ? carvedilol  25 mg Oral BID WC  ? cloNIDine  0.3 mg Transdermal Q Fri  ? heparin injection (subcutaneous)  5,000 Units Subcutaneous Q8H  ? hydrALAZINE  100 mg Oral Q8H  ? insulin aspart  0-15 Units Subcutaneous TID WC  ? insulin aspart  0-5 Units Subcutaneous QHS  ? levothyroxine  175 mcg Oral Q0600  ? polyvinyl alcohol  1 drop Both Eyes Daily  ? sodium chloride flush  3 mL Intravenous Q12H  ? ticagrelor  90 mg Oral BID  ? ?Continuous Infusions: ? sodium chloride    ? ?PRN Meds: ?sodium chloride, acetaminophen, nitroGLYCERIN, ondansetron (ZOFRAN) IV, sodium chloride flush, zolpidem  ? ?Vital Signs  ?  ?Vitals:  ? 05/16/21 2006 05/17/21 0000 05/17/21 0400 05/17/21 0700  ?BP: 131/66 (!) 144/69 (!) 130/53 (!) 154/67  ?Pulse: 60 70 62 67  ?Resp: '17 16 16 15  '$ ?Temp: 98.2 ?F (36.8 ?C) 98.4 ?F (36.9 ?C) 98.9 ?F (37.2 ?C) 98.2 ?F (36.8 ?C)  ?TempSrc: Oral Oral Oral Oral  ?SpO2:  94% 95% 100%  ?Weight:   101.5 kg   ?Height:      ? ? ?Intake/Output Summary (Last 24 hours) at 05/17/2021 0756 ?Last data filed at 05/16/2021 1200 ?Gross per 24 hour  ?Intake 360 ml  ?Output 0 ml  ?Net 360 ml  ? ? ?  05/17/2021  ?  4:00 AM 05/16/2021  ?  5:00 AM 05/15/2021  ?  3:54 AM  ?Last 3 Weights  ?Weight (lbs) 223 lb 12.3 oz 222 lb 10.6 oz 221 lb 3.2 oz  ?Weight (kg) 101.5 kg 101 kg 100.336 kg  ?   ? ?Telemetry  ?  ?SR - Personally Reviewed ? ?ECG  ?  ?No new - Personally Reviewed ? ?Physical Exam  ? ?GEN: No acute distress.   ?Neck: No JVD ?Cardiac: RRR, 2/6 systolic murmur, no rubs, or gallops.   ?Respiratory: Clear to auscultation bilaterally. ?GI: Soft, nontender, non-distended  ?MS: No edema; No deformity. ?Neuro:  Nonfocal  ?Psych: Normal affect  ? ?Labs  ?  ?High Sensitivity Troponin:   ?Recent Labs  ?Lab 05/11/21 ?2339 05/12/21 ?0426  ?TROPONINIHS 169* 876*  ?   ?Chemistry ?Recent Labs  ?Lab 05/12/21 ?0623 05/13/21 ?7628 05/15/21 ?3151 05/16/21 ?7616 05/17/21 ?0737  ?NA 143   < > 139 139 136  ?K 4.4   < > 4.7 4.5 4.6  ?CL 110   < > 110 109 109  ?CO2 22   < > 20* 20* 19*  ?GLUCOSE 189*   < > 137* 105* 120*  ?BUN 100*   < > 105* 110* 112*  ?CREATININE 4.21*   < > 4.66* 4.88* 4.93*  ?CALCIUM 9.7   < > 9.2 9.3 9.3  ?MG 2.2  --   --   --   --   ?PROT 5.9*  --   --   --   --   ?ALBUMIN 3.1*  --   --   --   --   ?  AST 46*  --   --   --   --   ?ALT 25  --   --   --   --   ?ALKPHOS 65  --   --   --   --   ?BILITOT 0.7  --   --   --   --   ?GFRNONAA 11*   < > 10* 9* 9*  ?ANIONGAP 11   < > '9 10 8  '$ ? < > = values in this interval not displayed.  ?  ?Lipids  ?Recent Labs  ?Lab 05/11/21 ?2339  ?CHOL 151  ?TRIG 51  ?HDL 45  ?[place] 96  ?CHOLHDL 3.4  ?  ?Hematology ?Recent Labs  ?Lab 05/15/21 ?1427 05/16/21 ?7209 05/17/21 ?0336  ?WBC 4.2 3.7* 4.2  ?RBC 3.23* 2.87* 2.90*  ?HGB 8.4* 7.6* 7.7*  ?HCT 27.8* 24.5* 24.5*  ?MCV 86.1 85.4 84.5  ?MCH 26.0 26.5 26.6  ?MCHC 30.2 31.0 31.4  ?RDW 15.9* 15.6* 15.8*  ?PLT 76* 65* 74*  ? ?Thyroid  ?Recent Labs  ?Lab 05/12/21 ?0426 05/13/21 ?0211  ?TSH 0.297*  --   ?FREET4  --  1.55*  ?  ?BNPNo results for input(s): BNP, PROBNP in the last 168 hours.  ?DDimer No results for input(s): DDIMER in the last 168 hours.  ? ?Radiology  ?  ?No results found. ? ?Cardiac Studies  ? ?Cath: 05/12/21 ?  ?  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 ?  Dist RCA lesion is 80% stenosed. ?  A stent was successfully placed. ?  A stent was successfully placed. ?  Post intervention, there is a 0% residual stenosis. ?  Post intervention, there is a 0% residual stenosis. ?  LV end diastolic pressure is normal. ?  ?1.   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 ?2.  LVEDP of 11 mmHg. ?  ?Recommendations: Aggrastat infusion for 18 hours and dual antiplatelet therapy for at least 1 year.  Given the patient's chronic kidney disease this will need to be monitored over the next few days and the need for renal replacement therapy assessed. ?  ?Diagnostic ?Dominance: Right ?Intervention ?  ?  ?Echo: 05/12/21 ?  ?IMPRESSIONS  ? 1. Left ventricular ejection fraction, by estimation, is 70 to 75%. The  ?left ventricle has hyperdynamic function. The left ventricle has no  ?regional wall motion abnormalities. There is severe concentric left  ?ventricular hypertrophy. Left ventricular  ?diastolic function could not be evaluated.  ? 2. Moderate calcific mitral valve disease is present. MG 6.3 mmHG @ 69  ?bpm. There is calcified chordal tissue under the MV which does demonstrate  ?some mobility. 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 The mitral valve is  ?degenerative. Trivial mitral valve regurgitation. Moderate mitral  ?stenosis. The mean mitral valve gradient is 6.3 mmHg with average heart  ?rate of 69 bpm.  ? 3. Right ventricular systolic function is normal. The right ventricular  ?size is normal. There is mildly elevated pulmonary artery systolic  ?pressure. The estimated right ventricular systolic pressure is 47.0 mmHg.  ? 4. The aortic valve is tricuspid. There is moderate calcification of the  ?aortic valve. There is moderate thickening of the aortic valve. Aortic  ?valve regurgitation is not visualized. Mild aortic valve stenosis. Aortic  ?valve area, by  VTI measures 2.04  ?cm?Marland Kitchen Aortic valve mean gradient measures 11.0 mmHg. Aortic valve Vmax  ?measures 2.36 m/s.  ? 5. Left atrial size was severely dilated.  ? 6.  Right atrial size was mildly dilated.  ? 7. A small pericardial effusion is present. The pericardial effusion is  ?circumferential.  ? 8. The inferior vena cava is dilated in size with <50% respiratory  ?variability, suggesting right atrial pressure of 15 mmHg.  ? ?Comparison(s): No significant change from prior study.  ? ?FINDINGS  ? Left Ventricle: Left ventricular ejection fraction, by estimation, is 70  ?to 75%. The left ventricle has hyperdynamic function. The left ventricle  ?has no regional wall motion abnormalities. The left ventricular internal  ?cavity size was normal in size.  ?There is severe concentric left ventricular hypertrophy. Left ventricular  ?diastolic function could not be evaluated due to mitral annular  ?calcification (moderate or greater). Left ventricular diastolic function  ?could not be evaluated. The ratio of  ?pulmonic flow to systemic flow (Qp/Qs ratio) is 0.90.  ? ?Right Ventricle: The right ventricular size is normal. No increase in  ?right ventricular wall thickness. Right ventricular systolic function is  ?normal. There is mildly elevated pulmonary artery systolic pressure. The  ?tricuspid regurgitant velocity is 2.52  ? m/s, and with an assumed right atrial pressure of 15 mmHg, the estimated  ?right ventricular systolic pressure is 51.0 mmHg.  ? ?Left Atrium: Left atrial size was severely dilated.  ? ?Right Atrium: Right atrial size was mildly dilated.  ? ?Pericardium: A small pericardial effusion is present. The pericardial  ?effusion is circumferential.  ? ?Mitral Valve: Moderate calcific mitral valve disease is present. MG 6.3  ?mmHG @ 69 bpm. There is calcified chordal tissue under the MV which does  ?demonstrate some mobility. Suspect this is related to degerative MV  ?disease and not endocarditis. The mitral  ?valve is degenerative in appearance. Trivial mitral valve regurgitation.  ?Moderate mitral valve stenosis. MV peak gradient, 14.1 mmHg. The mean  ?mitral valve  gradient is 6.3 mmHg with average heart rate of 69 bpm.  ? ?Tricuspid Valve: The tricuspid valve is grossly normal. Tricuspid valve  ?regurgitation is mild . No evidence of tricuspid stenosis.  ? ?Aortic Valve: The aortic va

## 2021-05-17 NOTE — Progress Notes (Signed)
?[facility] ?Progress Note  ? ? 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 Follows Dr. Johnney Ou in the outpatient setting. Had 2-3 urine occurrences (unable to see accurate output). Kidney function stable from yesterday. Patient is not interested in pursuing dialysis so will focus on supportive care.  ?-baseline GFR ~10-12, currently GFR 9 ?-BUN relatively unchanged from yesterday, remains without encephalopathy or asterixis ?-C3 levels wnl ?-Strict I/O's, daily weights ?-trend renal function ?-avoid nephrotoxic medications (aside from baby ASA) ?-no role for HD as patient is not interested ? ?2. Inferior STEMI s/p PCI - management per cardiology ? ?3. T2DM with hyperglycemia ?-Last A1c 6.7% ?-on SSI ?-CBGs well controlled ? ?4. Normocytic Anemia ?-Jehovah's Witness - AVOID blood transfusions unless patient gives consent ?-Hgb stable, no overt signs of bleeding ?-on ASA and brillinta given stent placement ?-iron is fairly replete but can consider '500mg'$  should Hgb downtrend ?-last dose of aranesp 2 days ago, can consider repeat dose if Hgb downtrending ? ?5. Hypothyroidism - on synthroid per primary ? ?6. HTN ?-slightly elevated BP today ?-on coreg, norvasc, clonidine patch, and hydralazine ? ?7. HLD ?-on statin ? ?Subjective:   ? ?Evaluated patient at bedside. States that she feels much better today, more energy. Has an appetite and is eating breakfast.  ? ?She was able to walk around the hall yesterday with assistance and is up for it again today.  ? ?Objective:   ?BP (!) 154/67 (BP Location: Left Arm) Comment: nurse aware  Pulse 67   Temp 98.2 ?F (36.8 ?C) (Oral)   Resp 15   Ht '5\' 9"'$  (1.753 m)   Wt 101.5 kg   SpO2 100%   BMI 33.04 kg/m?  ? ?Intake/Output Summary (Last 24 hours) at 05/17/2021 0843 ?Last data filed at 05/17/2021 0800 ?Gross per 24 hour  ?Intake 480 ml  ?Output 0 ml  ?Net 480 ml  ? ?Weight change: 0.5 kg ? ?Physical  Exam: ?Gen: obese elderly female, sitting up in bed, NAD. ?CV: normal rate and regular rhythm, + systolic murmur. ?Pulm: CTABL, normal work of breathing on RA. ?Ext: trace edema in bilateral LE (R>L) ?Skin: warm and dry. ?Neuro: AAOx3, no focal deficits, no asterixis noted. ? ?Imaging: ?No results found. ? ?Labs: ?BMET ?Recent Labs  ?Lab 05/11/21 ?2339 05/12/21 ?6468 05/13/21 ?0211 05/14/21 ?0321 05/15/21 ?2248 05/16/21 ?2500 05/17/21 ?3704  ?NA 141 143 139 140 139 139 136  ?K 4.6 4.4 4.1 4.2 4.7 4.5 4.6  ?CL 108 110 110 110 110 109 109  ?CO2 23 22 20* 20* 20* 20* 19*  ?GLUCOSE 182* 189* 157* 114* 137* 105* 120*  ?BUN 102* 100* 95* 93* 105* 110* 112*  ?CREATININE 4.54* 4.21* 4.08* 4.22* 4.66* 4.88* 4.93*  ?CALCIUM 9.8 9.7 9.5 9.6 9.2 9.3 9.3  ? ?CBC ?Recent Labs  ?Lab 05/11/21 ?2339 05/12/21 ?0426 05/15/21 ?0228 05/15/21 ?1427 05/16/21 ?8889 05/17/21 ?0336  ?WBC 5.0   < > 4.5 4.2 3.7* 4.2  ?NEUTROABS 2.5  --   --   --   --   --   ?HGB 9.8*   < > 8.0* 8.4* 7.6* 7.7*  ?HCT 32.5*   < > 25.8* 27.8* 24.5* 24.5*  ?MCV 86.0   < > 85.1 86.1 85.4 84.5  ?PLT 108*   < > 71* 76* 65* 74*  ? < > = values in this interval not displayed.  ? ? ?Medications:   ? ?  amLODipine  10 mg Oral Daily  ? aspirin  81 mg Oral Daily  ? atorvastatin  80 mg Oral QHS  ? calcitRIOL  0.5 mcg Oral Daily  ? carvedilol  25 mg Oral BID WC  ? cloNIDine  0.3 mg Transdermal Q Fri  ? heparin injection (subcutaneous)  5,000 Units Subcutaneous Q8H  ? hydrALAZINE  100 mg Oral Q8H  ? insulin aspart  0-15 Units Subcutaneous TID WC  ? insulin aspart  0-5 Units Subcutaneous QHS  ? levothyroxine  175 mcg Oral Q0600  ? polyvinyl alcohol  1 drop Both Eyes Daily  ? sodium chloride flush  3 mL Intravenous Q12H  ? ticagrelor  90 mg Oral BID  ? ? ?Virl Axe, MD ?05/17/2021, 8:43 AM  ? ?

## 2021-05-18 DIAGNOSIS — N186 End stage renal disease: Secondary | ICD-10-CM | POA: Diagnosis not present

## 2021-05-18 DIAGNOSIS — I2111 ST elevation (STEMI) myocardial infarction involving right coronary artery: Secondary | ICD-10-CM | POA: Diagnosis not present

## 2021-05-18 DIAGNOSIS — I10 Essential (primary) hypertension: Secondary | ICD-10-CM | POA: Diagnosis not present

## 2021-05-18 DIAGNOSIS — E039 Hypothyroidism, unspecified: Secondary | ICD-10-CM | POA: Diagnosis not present

## 2021-05-18 LAB — CBC
HCT: 24.4 % — ABNORMAL LOW (ref 36.0–46.0)
Hemoglobin: 7.4 g/dL — ABNORMAL LOW (ref 12.0–15.0)
MCH: 26.1 pg (ref 26.0–34.0)
MCHC: 30.3 g/dL (ref 30.0–36.0)
MCV: 85.9 fL (ref 80.0–100.0)
Platelets: 80 10*3/uL — ABNORMAL LOW (ref 150–400)
RBC: 2.84 MIL/uL — ABNORMAL LOW (ref 3.87–5.11)
RDW: 15.6 % — ABNORMAL HIGH (ref 11.5–15.5)
WBC: 4.2 10*3/uL (ref 4.0–10.5)
nRBC: 0 % (ref 0.0–0.2)

## 2021-05-18 LAB — GLUCOSE, CAPILLARY
Glucose-Capillary: 145 mg/dL — ABNORMAL HIGH (ref 70–99)
Glucose-Capillary: 107 mg/dL — ABNORMAL HIGH (ref 70–99)
Glucose-Capillary: 147 mg/dL — ABNORMAL HIGH (ref 70–99)
Glucose-Capillary: 109 mg/dL — ABNORMAL HIGH (ref 70–99)

## 2021-05-18 LAB — BASIC METABOLIC PANEL
Anion gap: 9 (ref 5–15)
BUN: 115 mg/dL — ABNORMAL HIGH (ref 8–23)
CO2: 18 mmol/L — ABNORMAL LOW (ref 22–32)
Calcium: 9.3 mg/dL (ref 8.9–10.3)
Chloride: 108 mmol/L (ref 98–111)
Creatinine, Ser: 5.14 mg/dL — ABNORMAL HIGH (ref 0.44–1.00)
GFR, Estimated: 8 mL/min — ABNORMAL LOW (ref >60)
Glucose, Bld: 113 mg/dL — ABNORMAL HIGH (ref 70–99)
Potassium: 4.6 mmol/L (ref 3.5–5.1)
Sodium: 135 mmol/L (ref 135–145)

## 2021-05-18 MED ORDER — SODIUM BICARBONATE 650 MG PO TABS
650.0000 mg | ORAL_TABLET | Freq: Two times a day (BID) | ORAL | Status: DC
  Administered 2021-05-18 – 2021-05-20 (×5): 650 mg via ORAL
  Filled 2021-05-18 (×5): qty 1

## 2021-05-18 MED ORDER — SODIUM CHLORIDE 0.9 % IV SOLN
250.0000 mg | Freq: Every day | INTRAVENOUS | Status: AC
  Administered 2021-05-18 – 2021-05-19 (×2): 250 mg via INTRAVENOUS
  Filled 2021-05-18: qty 20
  Filled 2021-05-18: qty 250

## 2021-05-18 NOTE — Progress Notes (Addendum)
? ?Progress Note ? ?Patient Name: Tiffany [name] ?Date of Encounter: 05/18/2021 ? ?[facility] Cardiologist: Sinclair Grooms, MD  ? ?Subjective  ? ?No chest pain no SOB ? ?Inpatient Medications  ?  ?Scheduled Meds: ? amLODipine  10 mg Oral Daily  ? aspirin  81 mg Oral Daily  ? atorvastatin  80 mg Oral QHS  ? calcitRIOL  0.5 mcg Oral Daily  ? carvedilol  25 mg Oral BID WC  ? cloNIDine  0.3 mg Transdermal Q Fri  ? hydrALAZINE  100 mg Oral Q8H  ? insulin aspart  0-15 Units Subcutaneous TID WC  ? insulin aspart  0-5 Units Subcutaneous QHS  ? levothyroxine  175 mcg Oral Q0600  ? polyvinyl alcohol  1 drop Both Eyes Daily  ? sodium bicarbonate  650 mg Oral BID  ? sodium chloride flush  3 mL Intravenous Q12H  ? ticagrelor  90 mg Oral BID  ? ?Continuous Infusions: ? sodium chloride    ? ferric gluconate (FERRLECIT) IVPB    ? ?PRN Meds: ?sodium chloride, acetaminophen, nitroGLYCERIN, ondansetron (ZOFRAN) IV, sodium chloride flush, zolpidem  ? ?Vital Signs  ?  ?Vitals:  ? 05/17/21 2102 05/18/21 4259 05/18/21 0841 05/18/21 0844  ?BP: 139/69 (!) 152/72 (!) 157/72 (!) 157/72  ?Pulse: 66 68 64   ?Resp: 20 16    ?Temp: 97.8 ?F (36.6 ?C) 98 ?F (36.7 ?C)    ?TempSrc: Oral Oral    ?SpO2: 90% 99%    ?Weight:  108.1 kg    ?Height:      ? ? ?Intake/Output Summary (Last 24 hours) at 05/18/2021 0940 ?Last data filed at 05/17/2021 2215 ?Gross per 24 hour  ?Intake 360 ml  ?Output --  ?Net 360 ml  ? ? ?  05/18/2021  ?  6:09 AM 05/17/2021  ?  4:00 AM 05/16/2021  ?  5:00 AM  ?Last 3 Weights  ?Weight (lbs) 238 lb 5.1 oz 223 lb 12.3 oz 222 lb 10.6 oz  ?Weight (kg) 108.1 kg 101.5 kg 101 kg  ?   ? ?Telemetry  ?  ?SR - Personally Reviewed ? ?ECG  ?  ?No New - Personally Reviewed ? ?Physical Exam  ?Exam per Dr. Claiborne Billings ?GEN: No acute distress.   ?Neck: No JVD ?Cardiac: RRR, no murmurs, rubs, or gallops.  ?Respiratory: Clear to auscultation bilaterally. ?GI: Soft, nontender, non-distended  ?MS: No edema; No deformity. ?Neuro:  Nonfocal  ?Psych:  Normal affect  ? ?Labs  ?  ?High Sensitivity Troponin:   ?Recent Labs  ?Lab 05/11/21 ?2339 05/12/21 ?0426  ?TROPONINIHS 169* 876*  ?   ?Chemistry ?Recent Labs  ?Lab 05/12/21 ?5638 05/13/21 ?0211 05/16/21 ?7564 05/17/21 ?3329 05/18/21 ?5188  ?NA 143   < > 139 136 135  ?K 4.4   < > 4.5 4.6 4.6  ?CL 110   < > 109 109 108  ?CO2 22   < > 20* 19* 18*  ?GLUCOSE 189*   < > 105* 120* 113*  ?BUN 100*   < > 110* 112* 115*  ?CREATININE 4.21*   < > 4.88* 4.93* 5.14*  ?CALCIUM 9.7   < > 9.3 9.3 9.3  ?MG 2.2  --   --   --   --   ?PROT 5.9*  --   --   --   --   ?ALBUMIN 3.1*  --   --   --   --   ?AST 46*  --   --   --   --   ?  ALT 25  --   --   --   --   ?ALKPHOS 65  --   --   --   --   ?BILITOT 0.7  --   --   --   --   ?GFRNONAA 11*   < > 9* 9* 8*  ?ANIONGAP 11   < > '10 8 9  '$ ? < > = values in this interval not displayed.  ?  ?Lipids  ?Recent Labs  ?Lab 05/11/21 ?2339  ?CHOL 151  ?TRIG 51  ?HDL 45  ?[place] 96  ?CHOLHDL 3.4  ?  ?Hematology ?Recent Labs  ?Lab 05/16/21 ?0620 05/17/21 ?0336 05/18/21 ?2671  ?WBC 3.7* 4.2 4.2  ?RBC 2.87* 2.90* 2.84*  ?HGB 7.6* 7.7* 7.4*  ?HCT 24.5* 24.5* 24.4*  ?MCV 85.4 84.5 85.9  ?MCH 26.5 26.6 26.1  ?MCHC 31.0 31.4 30.3  ?RDW 15.6* 15.8* 15.6*  ?PLT 65* 74* 80*  ? ?Thyroid  ?Recent Labs  ?Lab 05/12/21 ?0426 05/13/21 ?0211  ?TSH 0.297*  --   ?FREET4  --  1.55*  ?  ?BNPNo results for input(s): BNP, PROBNP in the last 168 hours.  ?DDimer No results for input(s): DDIMER in the last 168 hours.  ? ?Radiology  ?  ?No results found. ? ?Cardiac Studies  ? ?Cath: 05/12/21 ?  ?  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 ?  Dist RCA lesion is 80% stenosed. ?  A stent was successfully placed. ?  A stent was successfully placed. ?  Post intervention, there is a 0% residual stenosis. ?  Post intervention, there is a 0% residual stenosis. ?  LV end diastolic pressure is normal. ?  ?1.  100% occlusion of posterior descending artery treated with 2 overlapping drug-eluting stents; thrombotic occlusion noted during procedure at the  crux treated with 1 stent from the distal right coronary artery into the ostium of the PDA. ?2.  LVEDP of 11 mmHg. ?  ?Recommendations: Aggrastat infusion for 18 hours and dual antiplatelet therapy for at least 1 year.  Given the patient's chronic kidney disease this will need to be monitored over the next few days and the need for renal replacement therapy assessed. ?  ?Diagnostic ?Dominance: Right ?Intervention ?  ?  ?Echo: 05/12/21 ?  ?IMPRESSIONS  ? 1. Left ventricular ejection fraction, by estimation, is 70 to 75%. The  ?left ventricle has hyperdynamic function. The left ventricle has no  ?regional wall motion abnormalities. There is severe concentric left  ?ventricular hypertrophy. Left ventricular  ?diastolic function could not be evaluated.  ? 2. Moderate calcific mitral valve disease is present. MG 6.3 mmHG @ 69  ?bpm. There is calcified chordal tissue under the MV which does demonstrate  ?some mobility. 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 The mitral valve is  ?degenerative. Trivial mitral valve regurgitation. Moderate mitral  ?stenosis. The mean mitral valve gradient is 6.3 mmHg with average heart  ?rate of 69 bpm.  ? 3. Right ventricular systolic function is normal. The right ventricular  ?size is normal. There is mildly elevated pulmonary artery systolic  ?pressure. The estimated right ventricular systolic pressure is 24.5 mmHg.  ? 4. The aortic valve is tricuspid. There is moderate calcification of the  ?aortic valve. There is moderate thickening of the aortic valve. Aortic  ?valve regurgitation is not visualized. Mild aortic valve stenosis. Aortic  ?valve area, by VTI measures 2.04  ?cm?Marland Kitchen Aortic valve mean gradient measures 11.0 mmHg. Aortic valve Vmax  ?  measures 2.36 m/s.  ? 5. Left atrial size was severely dilated.  ? 6. Right atrial size was mildly dilated.  ? 7. A small pericardial effusion is present. The pericardial effusion is  ?circumferential.  ? 8. The  inferior vena cava is dilated in size with <50% respiratory  ?variability, suggesting right atrial pressure of 15 mmHg.  ? ?Comparison(s): No significant change from prior study.  ? ?FINDINGS  ? Left Ventricle: Left ventricular ejection fraction, by estimation, is 70  ?to 75%. The left ventricle has hyperdynamic function. The left ventricle  ?has no regional wall motion abnormalities. The left ventricular internal  ?cavity size was normal in size.  ?There is severe concentric left ventricular hypertrophy. Left ventricular  ?diastolic function could not be evaluated due to mitral annular  ?calcification (moderate or greater). Left ventricular diastolic function  ?could not be evaluated. The ratio of  ?pulmonic flow to systemic flow (Qp/Qs ratio) is 0.90.  ? ?Right Ventricle: The right ventricular size is normal. No increase in  ?right ventricular wall thickness. Right ventricular systolic function is  ?normal. There is mildly elevated pulmonary artery systolic pressure. The  ?tricuspid regurgitant velocity is 2.52  ? m/s, and with an assumed right atrial pressure of 15 mmHg, the estimated  ?right ventricular systolic pressure is 47.6 mmHg.  ? ?Left Atrium: Left atrial size was severely dilated.  ? ?Right Atrium: Right atrial size was mildly dilated.  ? ?Pericardium: A small pericardial effusion is present. The pericardial  ?effusion is circumferential.  ? ?Mitral Valve: Moderate calcific mitral valve disease is present. MG 6.3  ?mmHG @ 69 bpm. There is calcified chordal tissue under the MV which does  ?demonstrate some mobility. Suspect this is related to degerative MV  ?disease and not endocarditis. The mitral  ?valve is degenerative in appearance. Trivial mitral valve regurgitation.  ?Moderate mitral valve stenosis. MV peak gradient, 14.1 mmHg. The mean  ?mitral valve gradient is 6.3 mmHg with average heart rate of 69 bpm.  ? ?Tricuspid Valve: The tricuspid valve is grossly normal. Tricuspid valve  ?regurgitation  is mild . No evidence of tricuspid stenosis.  ? ?Aortic Valve: The aortic valve is tricuspid. There is moderate  ?calcification of the aortic valve. There is moderate thickening of the  ?aortic valve. Aortic valve

## 2021-05-18 NOTE — Progress Notes (Signed)
?[facility] ?Progress Note  ? ? 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 Follows Dr. Johnney Ou in the outpatient setting. Cr and BUN slightly worse today, remains without signs of uremia. Patient does not wish to pursue dialysis. Will continue to follow but likely will not have much additional input given current situation.  ?-Continue with supportive care, encourage PO intake ?-Strict I/O's, daily weights ?-trend renal function ?-avoid nephrotoxic medications (aside from baby ASA) ?-no role for HD as patient is not interested ? ?2. Inferior STEMI s/p PCI - management per cardiology ? ?3. T2DM with hyperglycemia ?-Last A1c 6.7% ?-on SSI ?-CBGs well controlled ? ?4. Normocytic Anemia ?-Jehovah's Witness - AVOID blood transfusions unless patient gives consent ?-Hgb stable ?-on ASA and brillinta given recent stent placement ?-will give '500mg'$  IV iron to bump up iron stores ?-last dose of aranesp 3 days ago ? ?5. Hypothyroidism - on synthroid, per primary ? ?6. HTN - per primary ?-BP slightly elevated ?-on coreg, norvasc, clonidine patch, and hydralazine ? ?7. HLD ?-on statin ? ?Subjective:   ? ?Evaluated patient at bedside. She is feeling less energetic today, no appetite this morning either. Frustrated that her kidney function is not getting better. Still does not wish to pursue HD, stating there is no point in prolonging life if her quality of life is going to suffer.   ? ?Objective:   ?BP (!) 157/72   Pulse 64   Temp 98 ?F (36.7 ?C) (Oral)   Resp 16   Ht '5\' 9"'$  (1.753 m)   Wt 108.1 kg   SpO2 99%   BMI 35.19 kg/m?  ? ?Intake/Output Summary (Last 24 hours) at 05/18/2021 1327 ?Last data filed at 05/17/2021 2215 ?Gross per 24 hour  ?Intake 360 ml  ?Output --  ?Net 360 ml  ? ?Weight change: 6.6 kg ? ?Physical Exam: ?Gen: obese elderly female, sitting up in bed, NAD. ?CV: normal rate and regular rhythm, systolic murmur present. ?Pulm: CTABL,  normal work of breathing on RA. ?Ext: trace edema in BLE (R>L), unchanged from yesterday. ?Skin: warm and dry. ?Neuro: AAOx3, no asterixis or focal deficits noted. ? ?Imaging: ?No results found. ? ?Labs: ?BMET ?Recent Labs  ?Lab 05/12/21 ?4782 05/13/21 ?0211 05/14/21 ?9562 05/15/21 ?1308 05/16/21 ?6578 05/17/21 ?4696 05/18/21 ?0254  ?NA 143 139 140 139 139 136 135  ?K 4.4 4.1 4.2 4.7 4.5 4.6 4.6  ?CL 110 110 110 110 109 109 108  ?CO2 22 20* 20* 20* 20* 19* 18*  ?GLUCOSE 189* 157* 114* 137* 105* 120* 113*  ?BUN 100* 95* 93* 105* 110* 112* 115*  ?CREATININE 4.21* 4.08* 4.22* 4.66* 4.88* 4.93* 5.14*  ?CALCIUM 9.7 9.5 9.6 9.2 9.3 9.3 9.3  ? ?CBC ?Recent Labs  ?Lab 05/11/21 ?2339 05/12/21 ?0426 05/15/21 ?1427 05/16/21 ?2952 05/17/21 ?8413 05/18/21 ?0254  ?WBC 5.0   < > 4.2 3.7* 4.2 4.2  ?NEUTROABS 2.5  --   --   --   --   --   ?HGB 9.8*   < > 8.4* 7.6* 7.7* 7.4*  ?HCT 32.5*   < > 27.8* 24.5* 24.5* 24.4*  ?MCV 86.0   < > 86.1 85.4 84.5 85.9  ?PLT 108*   < > 76* 65* 74* 80*  ? < > = values in this interval not displayed.  ? ? ?Medications:   ? ? amLODipine  10 mg Oral Daily  ? aspirin  81  mg Oral Daily  ? atorvastatin  80 mg Oral QHS  ? calcitRIOL  0.5 mcg Oral Daily  ? carvedilol  25 mg Oral BID WC  ? cloNIDine  0.3 mg Transdermal Q Fri  ? hydrALAZINE  100 mg Oral Q8H  ? insulin aspart  0-15 Units Subcutaneous TID WC  ? insulin aspart  0-5 Units Subcutaneous QHS  ? levothyroxine  175 mcg Oral Q0600  ? polyvinyl alcohol  1 drop Both Eyes Daily  ? sodium bicarbonate  650 mg Oral BID  ? sodium chloride flush  3 mL Intravenous Q12H  ? ticagrelor  90 mg Oral BID  ? ? ?Virl Axe, MD ?05/18/2021, 1:27 PM  ? ?

## 2021-05-18 NOTE — Progress Notes (Signed)
CARDIAC REHAB PHASE I  ?1020 ?Cardiac rehab RN in to ambulate with patient. Patient states she is about to receive iron infusion. Confirmed with primary RN. Patient declines ambulation with CR. Prefers to ambulate with [place], mobility team, later this pm once iron infusion is complete. Discussed importance of getting up to chair and ambulating as tolerated. Will continue to follow ? ?Laurali Goddard Minus Breeding RN, BSN  ?

## 2021-05-19 LAB — CBC
HCT: 24.8 % — ABNORMAL LOW (ref 36.0–46.0)
Hemoglobin: 7.7 g/dL — ABNORMAL LOW (ref 12.0–15.0)
MCH: 26.4 pg (ref 26.0–34.0)
MCHC: 31 g/dL (ref 30.0–36.0)
MCV: 84.9 fL (ref 80.0–100.0)
Platelets: 95 10*3/uL — ABNORMAL LOW (ref 150–400)
RBC: 2.92 MIL/uL — ABNORMAL LOW (ref 3.87–5.11)
RDW: 15.7 % — ABNORMAL HIGH (ref 11.5–15.5)
WBC: 4.7 10*3/uL (ref 4.0–10.5)
nRBC: 0.4 % — ABNORMAL HIGH (ref 0.0–0.2)

## 2021-05-19 LAB — RENAL FUNCTION PANEL
Albumin: 2.9 g/dL — ABNORMAL LOW (ref 3.5–5.0)
Anion gap: 10 (ref 5–15)
BUN: 104 mg/dL — ABNORMAL HIGH (ref 8–23)
CO2: 18 mmol/L — ABNORMAL LOW (ref 22–32)
Calcium: 9.6 mg/dL (ref 8.9–10.3)
Chloride: 111 mmol/L (ref 98–111)
Creatinine, Ser: 4.9 mg/dL — ABNORMAL HIGH (ref 0.44–1.00)
GFR, Estimated: 9 mL/min — ABNORMAL LOW (ref >60)
Glucose, Bld: 108 mg/dL — ABNORMAL HIGH (ref 70–99)
Phosphorus: 5 mg/dL — ABNORMAL HIGH (ref 2.5–4.6)
Potassium: 4.5 mmol/L (ref 3.5–5.1)
Sodium: 139 mmol/L (ref 135–145)

## 2021-05-19 LAB — GLUCOSE, CAPILLARY
Glucose-Capillary: 115 mg/dL — ABNORMAL HIGH (ref 70–99)
Glucose-Capillary: 134 mg/dL — ABNORMAL HIGH (ref 70–99)
Glucose-Capillary: 212 mg/dL — ABNORMAL HIGH (ref 70–99)
Glucose-Capillary: 140 mg/dL — ABNORMAL HIGH (ref 70–99)

## 2021-05-19 MED ORDER — ONDANSETRON HCL 4 MG/2ML IJ SOLN
4.0000 mg | Freq: Once | INTRAMUSCULAR | Status: AC
  Administered 2021-05-19: 4 mg via INTRAVENOUS
  Filled 2021-05-19: qty 2

## 2021-05-19 MED ORDER — TORSEMIDE 20 MG PO TABS
100.0000 mg | ORAL_TABLET | Freq: Every day | ORAL | Status: DC
  Administered 2021-05-19 – 2021-05-20 (×2): 100 mg via ORAL
  Filled 2021-05-19 (×2): qty 5

## 2021-05-19 NOTE — Progress Notes (Signed)
In to assist patient with ambulation, c/o nausea, has been given zofran, still afraid to take her pills.  Advised to walk with staff when feeling better. ?

## 2021-05-19 NOTE — Progress Notes (Signed)
? ?Progress Note ? ?Patient Name: Tiffany [name] ?Date of Encounter: 05/19/2021 ? ?[facility] Cardiologist: Sinclair Grooms, MD  ? ?Subjective  ? ?Some nausea this AM.  ? ?Inpatient Medications  ?  ?Scheduled Meds: ? amLODipine  10 mg Oral Daily  ? aspirin  81 mg Oral Daily  ? atorvastatin  80 mg Oral QHS  ? calcitRIOL  0.5 mcg Oral Daily  ? carvedilol  25 mg Oral BID WC  ? cloNIDine  0.3 mg Transdermal Q Fri  ? hydrALAZINE  100 mg Oral Q8H  ? insulin aspart  0-15 Units Subcutaneous TID WC  ? insulin aspart  0-5 Units Subcutaneous QHS  ? levothyroxine  175 mcg Oral Q0600  ? polyvinyl alcohol  1 drop Both Eyes Daily  ? sodium bicarbonate  650 mg Oral BID  ? sodium chloride flush  3 mL Intravenous Q12H  ? ticagrelor  90 mg Oral BID  ? ?Continuous Infusions: ? sodium chloride 10 mL/hr at 05/18/21 1306  ? ferric gluconate (FERRLECIT) IVPB Stopped (05/18/21 1255)  ? ?PRN Meds: ?sodium chloride, acetaminophen, nitroGLYCERIN, ondansetron (ZOFRAN) IV, sodium chloride flush, zolpidem  ? ?Vital Signs  ?  ?Vitals:  ? 05/18/21 1402 05/18/21 1727 05/18/21 2039 05/19/21 0411  ?BP: (!) 141/71 (!) 150/75 (!) 163/77 (!) 146/66  ?Pulse:  (!) 49 (!) 57 70  ?Resp:   18 18  ?Temp:   98.1 ?F (36.7 ?C) 98.4 ?F (36.9 ?C)  ?TempSrc:   Oral Oral  ?SpO2:   100%   ?Weight:    101.6 kg  ?Height:      ? ? ?Intake/Output Summary (Last 24 hours) at 05/19/2021 0716 ?Last data filed at 05/19/2021 0602 ?Gross per 24 hour  ?Intake 632.22 ml  ?Output --  ?Net 632.22 ml  ? ? ?  05/19/2021  ?  4:11 AM 05/18/2021  ?  6:09 AM 05/17/2021  ?  4:00 AM  ?Last 3 Weights  ?Weight (lbs) 223 lb 14.4 oz 238 lb 5.1 oz 223 lb 12.3 oz  ?Weight (kg) 101.56 kg 108.1 kg 101.5 kg  ?   ? ?Telemetry  ?  ?SR - Personally Reviewed ? ?ECG  ?  ?N/a - Personally Reviewed ? ?Physical Exam  ? ?GEN: No acute distress.   ?Neck: No JVD ?Cardiac: 2/6 systolic murmur rusb ?Respiratory: Clear to auscultation bilaterally. ?GI: Soft, nontender, non-distended  ?MS: No edema; No  deformity. ?Neuro:  Nonfocal  ?Psych: Normal affect  ? ?Labs  ?  ?High Sensitivity Troponin:   ?Recent Labs  ?Lab 05/11/21 ?2339 05/12/21 ?0426  ?TROPONINIHS 169* 876*  ?   ?Chemistry ?Recent Labs  ?Lab 05/12/21 ?6270 05/13/21 ?0211 05/16/21 ?3500 05/17/21 ?9381 05/18/21 ?8299  ?NA 143   < > 139 136 135  ?K 4.4   < > 4.5 4.6 4.6  ?CL 110   < > 109 109 108  ?CO2 22   < > 20* 19* 18*  ?GLUCOSE 189*   < > 105* 120* 113*  ?BUN 100*   < > 110* 112* 115*  ?CREATININE 4.21*   < > 4.88* 4.93* 5.14*  ?CALCIUM 9.7   < > 9.3 9.3 9.3  ?MG 2.2  --   --   --   --   ?PROT 5.9*  --   --   --   --   ?ALBUMIN 3.1*  --   --   --   --   ?AST 46*  --   --   --   --   ?  ALT 25  --   --   --   --   ?ALKPHOS 65  --   --   --   --   ?BILITOT 0.7  --   --   --   --   ?GFRNONAA 11*   < > 9* 9* 8*  ?ANIONGAP 11   < > '10 8 9  '$ ? < > = values in this interval not displayed.  ?  ?Lipids No results for input(s): CHOL, TRIG, HDL, LABVLDL, LDLCALC, CHOLHDL in the last 168 hours.  ?Hematology ?Recent Labs  ?Lab 05/16/21 ?0620 05/17/21 ?0336 05/18/21 ?5809  ?WBC 3.7* 4.2 4.2  ?RBC 2.87* 2.90* 2.84*  ?HGB 7.6* 7.7* 7.4*  ?HCT 24.5* 24.5* 24.4*  ?MCV 85.4 84.5 85.9  ?MCH 26.5 26.6 26.1  ?MCHC 31.0 31.4 30.3  ?RDW 15.6* 15.8* 15.6*  ?PLT 65* 74* 80*  ? ?Thyroid  ?Recent Labs  ?Lab 05/13/21 ?0211  ?FREET4 1.55*  ?  ?BNPNo results for input(s): BNP, PROBNP in the last 168 hours.  ?DDimer No results for input(s): DDIMER in the last 168 hours.  ? ?Radiology  ?  ?No results found. ? ?Cardiac Studies  ? ? ? ?Patient Profile  ?   ?71 y.o. female with CKD stage V (not on dialysis), hypertension, hyperlipidemia, who was admitted on 05/12/2021 with acute inferior STEMI ? ?Assessment & Plan  ?  ?1.CAD/Inferior STEMI ?- DES x 2 to PDA, thrombotic occlusion during procedure at the ostium of the PDA treated with DES x1.Treated with IV aggrastat for total of 18 hrs and placed on DAPT with ASA/Brilinta for at least one year ?- 05/2021 echo LVEF 70-75%< no WMAs,  mod MS ? ?-  medical therapy with ASA 91, atorva 80, coreg '25mg'$  bid, brillinta '90mg'$  bid. No ACE/ARB given renal dysfunction ? ? ?2. Mitral stenosis ?-  moderate, mean grade 6 ? ?3. Mild aortic stenosis ?- mean grad 11, AVA VTI 2.04 ? ?4. HTN ?- already on max dose norvasc and hydralazine. On coreg '25mg'$  bid, low HRs would not titrate further. She is clonidine patch, with low HRs would not titrate further. ACE/ARB/diuretic/MRAs avoided given renal dysfunction. Follow trends, no great options, could consider adding alpha 1 blocker. 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  ? ? ?5. CKD V ? Long history of indecisiveness regarding initiation of dialysis.  Has been offered this in the past. Has consistently turned down HD   ?-- seen by Nephrology. ?Labs pending this AM ? ? ?6. Hypothyroidism: Was on 224 mcg of Synthroid at home.  TSH too low and T4 elevated.   ?-- reduced dose to 175 mcg daily ? ?7. Anemia ?- Of note she is a Jehovah's Witness and continues to refuse blood products ?-- FOBT negative ?- received dose of aranesp 4/11. Iron per neprhology. ?Labs pending this AM ? ?Discharge when ok from renal standpoint ? ?For questions or updates, please contact [place] ?Please consult [email or web address] for contact info under  ? ?  ?   ?Signed, ?Carlyle Dolly, MD  ?05/19/2021, 7:16 AM   ? ?

## 2021-05-19 NOTE — Progress Notes (Signed)
?[facility] ?Progress Note  ? ? 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 Follows Dr. Johnney Ou in the outpatient setting. Cr and BUN slightly worse today, remains without signs of uremia. Patient does not wish to pursue dialysis.  ?-Creatinine slightly better today down to 4.9.  Likely this may be her new baseline ?-Continue with supportive care ?-Given her signs of volume overload I will restart home torsemide 100 mg daily; encourage cardiology to consider adjusting dose as needed based on her volume status ?-Strict I/O's, daily weights ?-trend renal function ?-avoid nephrotoxic medications (aside from baby ASA) ?-no role for HD as patient is not interested ? ?2. Inferior STEMI s/p PCI - management per cardiology ? ?3. T2DM with hyperglycemia ?-Last A1c 6.7% ?-on SSI ?-CBGs well controlled ? ?4. Normocytic Anemia ?-Jehovah's Witness - AVOID blood transfusions unless patient gives consent ?-Hgb stable ?-on DAPT given recent stent placement ?-Have given 500 mg of IV iron and aranesp dosed this week ? ?5. Hypothyroidism - on synthroid, per primary ? ?6. HTN - per primary ?-BP slightly elevated ?-on coreg, norvasc, clonidine patch, and hydralazine ? ?7. HLD ?-on statin ? ?8.  Metabolic acidosis: Associated with CKD.  Continue sodium bicarb at current dose ? ?Given the patient's slightly improved kidney function and stable creatinine in general with no uremic symptoms and no plans for dialysis we will sign off at this time.  If further help is needed please do not hesitate to contact us. ? ?Subjective:   ? ?Patient states she is overall feeling well.  Does have some fatigue.  Also has dyspnea on exertion.  Feels like her lower extremity edema is worse  ? ?Objective:   ?BP (!) 161/77 (BP Location: Left Arm)   Pulse 61   Temp 98.1 ?F (36.7 ?C) (Oral)   Resp 17   Ht '5\' 9"'$  (1.753 m)   Wt 101.6 kg   SpO2 98%   BMI 33.06 kg/m?  ? ?Intake/Output  Summary (Last 24 hours) at 05/19/2021 0912 ?Last data filed at 05/19/2021 0602 ?Gross per 24 hour  ?Intake 632.22 ml  ?Output --  ?Net 632.22 ml  ? ?Weight change: -6.54 kg ? ?Physical Exam: ?Gen: Lying flat in bed, no distress ?CV: Normal rate, no rub ?Pulm: Clear to auscultation anteriorly, no increased work of breathing ?Ext: 1+ edema in the bilateral ankles, warm and well-perfused ?Skin: warm and dry. ?Neuro: AAOx3, no asterixis or focal deficits noted. ? ?Imaging: ?No results found. ? ?Labs: ?BMET ?Recent Labs  ?Lab 05/13/21 ?0211 05/14/21 ?6433 05/15/21 ?2951 05/16/21 ?8841 05/17/21 ?6606 05/18/21 ?3016 05/19/21 ?0109  ?NA 139 140 139 139 136 135 139  ?K 4.1 4.2 4.7 4.5 4.6 4.6 4.5  ?CL 110 110 110 109 109 108 111  ?CO2 20* 20* 20* 20* 19* 18* 18*  ?GLUCOSE 157* 114* 137* 105* 120* 113* 108*  ?BUN 95* 93* 105* 110* 112* 115* 104*  ?CREATININE 4.08* 4.22* 4.66* 4.88* 4.93* 5.14* 4.90*  ?CALCIUM 9.5 9.6 9.2 9.3 9.3 9.3 9.6  ?PHOS  --   --   --   --   --   --  5.0*  ? ?CBC ?Recent Labs  ?Lab 05/16/21 ?0620 05/17/21 ?0336 05/18/21 ?3235 05/19/21 ?5732  ?WBC 3.7* 4.2 4.2 4.7  ?HGB 7.6* 7.7* 7.4* 7.7*  ?HCT 24.5* 24.5* 24.4* 24.8*  ?MCV 85.4 84.5 85.9 84.9  ?PLT 65* 74* 80* 95*  ? ? ?  Medications:   ? ? amLODipine  10 mg Oral Daily  ? aspirin  81 mg Oral Daily  ? atorvastatin  80 mg Oral QHS  ? calcitRIOL  0.5 mcg Oral Daily  ? carvedilol  25 mg Oral BID WC  ? cloNIDine  0.3 mg Transdermal Q Fri  ? hydrALAZINE  100 mg Oral Q8H  ? insulin aspart  0-15 Units Subcutaneous TID WC  ? insulin aspart  0-5 Units Subcutaneous QHS  ? levothyroxine  175 mcg Oral Q0600  ? polyvinyl alcohol  1 drop Both Eyes Daily  ? sodium bicarbonate  650 mg Oral BID  ? sodium chloride flush  3 mL Intravenous Q12H  ? ticagrelor  90 mg Oral BID  ? torsemide  100 mg Oral Daily  ? ? ?Reesa Chew ? ?05/19/2021, 9:12 AM  ? ?

## 2021-05-20 LAB — BASIC METABOLIC PANEL
Anion gap: 9 (ref 5–15)
BUN: 103 mg/dL — ABNORMAL HIGH (ref 8–23)
CO2: 18 mmol/L — ABNORMAL LOW (ref 22–32)
Calcium: 9.3 mg/dL (ref 8.9–10.3)
Chloride: 110 mmol/L (ref 98–111)
Creatinine, Ser: 4.93 mg/dL — ABNORMAL HIGH (ref 0.44–1.00)
GFR, Estimated: 9 mL/min — ABNORMAL LOW (ref >60)
Glucose, Bld: 147 mg/dL — ABNORMAL HIGH (ref 70–99)
Potassium: 4.7 mmol/L (ref 3.5–5.1)
Sodium: 137 mmol/L (ref 135–145)

## 2021-05-20 LAB — CBC
HCT: 24 % — ABNORMAL LOW (ref 36.0–46.0)
Hemoglobin: 7.2 g/dL — ABNORMAL LOW (ref 12.0–15.0)
MCH: 26.1 pg (ref 26.0–34.0)
MCHC: 30 g/dL (ref 30.0–36.0)
MCV: 87 fL (ref 80.0–100.0)
Platelets: 102 10*3/uL — ABNORMAL LOW (ref 150–400)
RBC: 2.76 MIL/uL — ABNORMAL LOW (ref 3.87–5.11)
RDW: 15.9 % — ABNORMAL HIGH (ref 11.5–15.5)
WBC: 4.8 10*3/uL (ref 4.0–10.5)
nRBC: 0 % (ref 0.0–0.2)

## 2021-05-20 LAB — GLUCOSE, CAPILLARY
Glucose-Capillary: 134 mg/dL — ABNORMAL HIGH (ref 70–99)
Glucose-Capillary: 120 mg/dL — ABNORMAL HIGH (ref 70–99)

## 2021-05-20 MED ORDER — ATORVASTATIN CALCIUM 80 MG PO TABS: 80.0000 mg | ORAL_TABLET | Freq: Every day | ORAL | 1 refills | Status: DC

## 2021-05-20 MED ORDER — CLOPIDOGREL BISULFATE 75 MG PO TABS: 75.0000 mg | ORAL_TABLET | Freq: Every day | ORAL | 1 refills | Status: DC

## 2021-05-20 MED ORDER — CARVEDILOL 25 MG PO TABS: 25.0000 mg | ORAL_TABLET | Freq: Two times a day (BID) | ORAL | 1 refills | Status: DC

## 2021-05-20 MED ORDER — ONDANSETRON HCL 4 MG PO TABS
4.0000 mg | ORAL_TABLET | Freq: Four times a day (QID) | ORAL | Status: DC | PRN
  Administered 2021-05-20: 4 mg via ORAL
  Filled 2021-05-20: qty 1

## 2021-05-20 MED ORDER — HYDRALAZINE HCL 100 MG PO TABS: 100.0000 mg | ORAL_TABLET | Freq: Three times a day (TID) | ORAL | 1 refills | Status: DC

## 2021-05-20 MED ORDER — TORSEMIDE 100 MG PO TABS: 100.0000 mg | ORAL_TABLET | Freq: Every day | ORAL | 0 refills | Status: DC

## 2021-05-20 MED ORDER — CLOPIDOGREL BISULFATE 75 MG PO TABS
300.0000 mg | ORAL_TABLET | Freq: Once | ORAL | Status: AC
  Administered 2021-05-20: 300 mg via ORAL
  Filled 2021-05-20: qty 4

## 2021-05-20 MED ORDER — NITROGLYCERIN 0.4 MG SL SUBL
0.4000 mg | SUBLINGUAL_TABLET | SUBLINGUAL | 2 refills | Status: DC | PRN
Start: 2021-05-20 — End: 2022-09-23

## 2021-05-20 MED ORDER — CLOPIDOGREL BISULFATE 75 MG PO TABS
75.0000 mg | ORAL_TABLET | Freq: Every day | ORAL | Status: DC
Start: 2021-05-21 — End: 2021-05-20

## 2021-05-20 MED ORDER — LEVOTHYROXINE SODIUM 175 MCG PO TABS
175.0000 ug | ORAL_TABLET | Freq: Every day | ORAL | 1 refills | Status: DC
Start: 2021-05-21 — End: 2021-10-15

## 2021-05-20 MED ORDER — ONDANSETRON HCL 4 MG PO TABS: 4.0000 mg | ORAL_TABLET | Freq: Four times a day (QID) | ORAL | 0 refills | Status: DC | PRN

## 2021-05-20 NOTE — Discharge Summary (Addendum)
?Discharge Summary  ?  ?Patient ID: Tiffany [name] ?MRN: [id number]; DOB: February 16, 1950 ? ?Admit date: 05/11/2021 ?Discharge date: 05/20/2021 ? ?PCP:  Nolene Ebbs, MD ?  ?[facility] Providers ?Cardiologist:  Sinclair Grooms, MD   { ? ?Discharge Diagnoses  ?  ?Principal Problem: ?  STEMI (ST elevation myocardial infarction) ([place]) ?Active Problems: ?  Hypothyroidism ?  Chronic diastolic heart failure ([place]) ?  Essential hypertension ?  CKD (chronic kidney disease), stage V ([place]) ?  Hyperlipidemia ?  Type 2 diabetes mellitus with stage 5 chronic kidney disease ([place]) ? ? ? ?Diagnostic Studies/Procedures  ?  ?Cath: 05/12/21 ?  ?  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 ?  Dist RCA lesion is 80% stenosed. ?  A stent was successfully placed. ?  A stent was successfully placed. ?  Post intervention, there is a 0% residual stenosis. ?  Post intervention, there is a 0% residual stenosis. ?  LV end diastolic pressure is normal. ?  ?1.  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 ?2.  LVEDP of 11 mmHg. ?  ?Recommendations: Aggrastat infusion for 18 hours and dual antiplatelet therapy for at least 1 year.  Given the patient's chronic kidney disease this will need to be monitored over the next few days and the need for renal replacement therapy assessed. ?  ?Diagnostic ?Dominance: Right ?Intervention ?  ?  ?Echo: 05/12/21 ?  ?IMPRESSIONS  ? 1. Left ventricular ejection fraction, by estimation, is 70 to 75%. The  ?left ventricle has hyperdynamic function. The left ventricle has no  ?regional wall motion abnormalities. There is severe concentric left  ?ventricular hypertrophy. Left ventricular  ?diastolic function could not be evaluated.  ? 2. Moderate calcific mitral valve disease is present. MG 6.3 mmHG @ 69  ?bpm. There is calcified chordal tissue  under the MV which does demonstrate  ?some mobility. 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 The mitral valve is  ?degenerative. Trivial mitral valve regurgitation. Moderate mitral  ?stenosis. The mean mitral valve gradient is 6.3 mmHg with average heart  ?rate of 69 bpm.  ? 3. Right ventricular systolic function is normal. The right ventricular  ?size is normal. There is mildly elevated pulmonary artery systolic  ?pressure. The estimated right ventricular systolic pressure is 61.9 mmHg.  ? 4. The aortic valve is tricuspid. There is moderate calcification of the  ?aortic valve. There is moderate thickening of the aortic valve. Aortic  ?valve regurgitation is not visualized. Mild aortic valve stenosis. Aortic  ?valve area, by VTI measures 2.04  ?cm?Marland Kitchen Aortic valve mean gradient measures 11.0 mmHg. Aortic valve Vmax  ?measures 2.36 m/s.  ? 5. Left atrial size was severely dilated.  ? 6. Right atrial size was mildly dilated.  ? 7. A small pericardial effusion is present. The pericardial effusion is  ?circumferential.  ? 8. The inferior vena cava is dilated in size with <50% respiratory  ?variability, suggesting right atrial pressure of 15 mmHg.  ? ?Comparison(s): No significant change from prior study.  ? ?FINDINGS  ? Left Ventricle: Left ventricular ejection fraction, by estimation, is 70  ?to 75%. The left ventricle has hyperdynamic function. The left ventricle  ?has no regional wall motion abnormalities. The left ventricular internal  ?cavity size was normal in size.  ?There is  severe concentric left ventricular hypertrophy. Left ventricular  ?diastolic function could not be evaluated due to mitral annular  ?calcification (moderate or greater). Left ventricular diastolic function  ?could not be evaluated. The ratio of  ?pulmonic flow to systemic flow (Qp/Qs ratio) is 0.90.  ? ?Right Ventricle: The right ventricular size is normal. No increase in  ?right ventricular wall thickness.  Right ventricular systolic function is  ?normal. There is mildly elevated pulmonary artery systolic pressure. The  ?tricuspid regurgitant velocity is 2.52  ? m/s, and with an assumed right atrial pressure of 15 mmHg, the estimated  ?right ventricular systolic pressure is 96.2 mmHg.  ? ?Left Atrium: Left atrial size was severely dilated.  ? ?Right Atrium: Right atrial size was mildly dilated.  ? ?Pericardium: A small pericardial effusion is present. The pericardial  ?effusion is circumferential.  ? ?Mitral Valve: Moderate calcific mitral valve disease is present. MG 6.3  ?mmHG @ 69 bpm. There is calcified chordal tissue under the MV which does  ?demonstrate some mobility. Suspect this is related to degerative MV  ?disease and not endocarditis. The mitral  ?valve is degenerative in appearance. Trivial mitral valve regurgitation.  ?Moderate mitral valve stenosis. MV peak gradient, 14.1 mmHg. The mean  ?mitral valve gradient is 6.3 mmHg with average heart rate of 69 bpm.  ? ?Tricuspid Valve: The tricuspid valve is grossly normal. Tricuspid valve  ?regurgitation is mild . No evidence of tricuspid stenosis.  ? ?Aortic Valve: The aortic valve is tricuspid. There is moderate  ?calcification of the aortic valve. There is moderate thickening of the  ?aortic valve. Aortic valve regurgitation is not visualized. Mild aortic  ?stenosis is present. Aortic valve mean gradient  ?measures 11.0 mmHg. Aortic valve peak gradient measures 22.3 mmHg. Aortic  ?valve area, by VTI measures 2.04 cm?.  ? ?Pulmonic Valve: The pulmonic valve was grossly normal. Pulmonic valve  ?regurgitation is mild. No evidence of pulmonic stenosis.  ? ?Aorta: The aortic root is normal in size and structure.  ? ?Venous: The inferior vena cava is dilated in size with less than 50%  ?respiratory variability, suggesting right atrial pressure of 15 mmHg.  ? ?IAS/Shunts: The atrial septum is grossly normal. The ratio of pulmonic  ?flow to systemic flow (Qp/Qs ratio)  is 0.90.  ?_____________ ?  ?History of Present Illness   ?  ?Tiffany [name] is a 71 y.o. female with PMH HFpEF, CKD5, HLD, HTN, hypothyroidism, obesity, OSA on CPAP, Jehovah's Witness (NO BLOOD PRODUCTS) who presented with acute onset central non radiating CP with RCA STEMI on ECG.  ? ?Tiffany [name] reported that she had 8/10 severity non radiating chest pain that started at 10 PM on 05/12/21 while she was at home and at rest. She had associated nausea and diaphoresis. She has had no recent anginal equivalents leading up to this. She has never had coronary interventions or bypass surgery. She was last seen in OP cardiology last year on 05/09/20 by Ermalinda Barrios. Her primary cardiologist is Dr. Tamala Julian. She had an acute HFpEF exacerbation requiring hospitalization in 04/2020. She has required high dose diuretics following that hospitalization (torsemide 100 mg daily, metolazone 5 mg T/R/Sa) which was later increased to torsemide 150 mg daily and metolazone prn. She was apparently referred to hospice in 05/2020 as she had refused dialysis. ?  ?The day of admission, she presented to the ED with chest pain since 10 PM. She was given 2 SLNG by EMS without relief and ASA 324 mg by  EMS. She was still in 8/10 pain but conversant during interview. Extensive discussion regarding her GOC and the risk of coronary angiography resulting in further renal dysfunction and the need for dialysis. In light of her previous discussions regarding dialysis and reluctance to start she now says she would consider dialysis if it was needed following coronary angiography. Additionally she was a Jehovah's witness and refuses all blood products. She was very clear about her reluctance to get blood products even if if meant death. She has had conversations recently with her sister regarding [place] and understands the need to be FULL code during the procedure but after further discussion would be DNAR following procedure which is consistent with her  prior discussions. Options for medical management versus invasive coronary angiography with PCI were discussed and the patient wanted to proceed with coronary angiography despite a high chance of further r

## 2021-05-20 NOTE — Progress Notes (Signed)
? ?  Notified by RN that patient has had 2 episodes of emesis this morning. It sounds like she has had intermittent nausea/vomiting during admission. Plan is for discharge today. We will give a dose of Zofran and make sure she is feeling better prior to her going home. Will also prescribe Zofran for her to have as needed at home.  ? ?Darreld Mclean, PA-C ?05/20/2021 10:38 AM ? ? ?

## 2021-05-20 NOTE — Progress Notes (Signed)
? ?Progress Note ? ?Patient Name: Tiffany [name] ?Date of Encounter: 05/20/2021 ? ?[facility] Cardiologist: Sinclair Grooms, MD  ? ?Subjective  ? ?Some SOB she associated with taking brillinta ? ?Inpatient Medications  ?  ?Scheduled Meds: ? amLODipine  10 mg Oral Daily  ? aspirin  81 mg Oral Daily  ? atorvastatin  80 mg Oral QHS  ? calcitRIOL  0.5 mcg Oral Daily  ? carvedilol  25 mg Oral BID WC  ? cloNIDine  0.3 mg Transdermal Q Fri  ? hydrALAZINE  100 mg Oral Q8H  ? insulin aspart  0-15 Units Subcutaneous TID WC  ? insulin aspart  0-5 Units Subcutaneous QHS  ? levothyroxine  175 mcg Oral Q0600  ? polyvinyl alcohol  1 drop Both Eyes Daily  ? sodium bicarbonate  650 mg Oral BID  ? sodium chloride flush  3 mL Intravenous Q12H  ? ticagrelor  90 mg Oral BID  ? torsemide  100 mg Oral Daily  ? ?Continuous Infusions: ? sodium chloride 10 mL/hr at 05/18/21 1306  ? ?PRN Meds: ?sodium chloride, acetaminophen, nitroGLYCERIN, ondansetron (ZOFRAN) IV, sodium chloride flush, zolpidem  ? ?Vital Signs  ?  ?Vitals:  ? 05/19/21 1639 05/19/21 2003 05/20/21 0026 05/20/21 0346  ?BP:  140/66 (!) 122/55 (!) 159/72  ?Pulse: 60 (!) 55 60 62  ?Resp:  '18 18 16  '$ ?Temp:  98 ?F (36.7 ?C) 97.8 ?F (36.6 ?C) 98 ?F (36.7 ?C)  ?TempSrc:  Oral Oral Oral  ?SpO2:    100%  ?Weight:    104.3 kg  ?Height:      ? ? ?Intake/Output Summary (Last 24 hours) at 05/20/2021 0740 ?Last data filed at 05/20/2021 0019 ?Gross per 24 hour  ?Intake 120 ml  ?Output --  ?Net 120 ml  ? ? ?  05/20/2021  ?  3:46 AM 05/19/2021  ?  4:11 AM 05/18/2021  ?  6:09 AM  ?Last 3 Weights  ?Weight (lbs) 229 lb 15 oz 223 lb 14.4 oz 238 lb 5.1 oz  ?Weight (kg) 104.3 kg 101.56 kg 108.1 kg  ?   ? ?Telemetry  ?  ?SR - Personally Reviewed ? ?ECG  ?  ?N/a - Personally Reviewed ? ?Physical Exam  ? ?GEN: No acute distress.   ?Neck: No JVD ?Cardiac: RRR, no murmurs, rubs, or gallops.  ?Respiratory: Clear to auscultation bilaterally. ?GI: Soft, nontender, non-distended  ?MS: 1+ bilateral LE  edema No deformity. ?Neuro:  Nonfocal  ?Psych: Normal affect  ? ?Labs  ?  ?High Sensitivity Troponin:   ?Recent Labs  ?Lab 05/11/21 ?2339 05/12/21 ?0426  ?TROPONINIHS 169* 876*  ?   ?Chemistry ?Recent Labs  ?Lab 05/18/21 ?0254 05/19/21 ?8119 05/20/21 ?0157  ?NA 135 139 137  ?K 4.6 4.5 4.7  ?CL 108 111 110  ?CO2 18* 18* 18*  ?GLUCOSE 113* 108* 147*  ?BUN 115* 104* 103*  ?CREATININE 5.14* 4.90* 4.93*  ?CALCIUM 9.3 9.6 9.3  ?ALBUMIN  --  2.9*  --   ?GFRNONAA 8* 9* 9*  ?ANIONGAP '9 10 9  '$ ?  ?Lipids No results for input(s): CHOL, TRIG, HDL, LABVLDL, LDLCALC, CHOLHDL in the last 168 hours.  ?Hematology ?Recent Labs  ?Lab 05/18/21 ?0254 05/19/21 ?1478 05/20/21 ?0157  ?WBC 4.2 4.7 4.8  ?RBC 2.84* 2.92* 2.76*  ?HGB 7.4* 7.7* 7.2*  ?HCT 24.4* 24.8* 24.0*  ?MCV 85.9 84.9 87.0  ?MCH 26.1 26.4 26.1  ?MCHC 30.3 31.0 30.0  ?RDW 15.6* 15.7* 15.9*  ?PLT 80*  95* 102*  ? ?Thyroid No results for input(s): TSH, FREET4 in the last 168 hours.  ?BNPNo results for input(s): BNP, PROBNP in the last 168 hours.  ?DDimer No results for input(s): DDIMER in the last 168 hours.  ? ?Radiology  ?  ?No results found. ? ?Cardiac Studies  ? ? ?Patient Profile  ?   ?71 y.o. female with CKD stage V (not on dialysis), hypertension, hyperlipidemia, who was admitted on 05/12/2021 with acute inferior STEMI ? ?Assessment & Plan  ?  ?1.CAD/Inferior STEMI ?- DES x 2 to PDA, thrombotic occlusion during procedure at the ostium of the PDA treated with DES x1.Treated with IV aggrastat for total of 18 hrs and placed on DAPT with ASA/Brilinta for at least one year ?- 05/2021 echo LVEF 70-75%< no WMAs,  mod MS ?  ?- medical therapy with ASA 81, atorva 80, coreg '25mg'$  bid, brillinta '90mg'$  bid. No ACE/ARB given renal dysfunction ? ?- reports some SOB after taking brillinta, reports has not had this type of SOB before. Will change brillinta to plavix, loading dose '300mg'$  x 1 they '75mg'$  daily.  ?  ?  ?2. Mitral stenosis ?-  moderate, mean grade 6 ?  ?3. Mild aortic stenosis ?-  mean grad 11, AVA VTI 2.04 ?  ?4. HTN ?- already on max dose norvasc and hydralazine. On coreg '25mg'$  bid, low HRs would not titrate further. She is clonidine patch, with low HRs would not titrate further. ACE/ARB/diuretic/MRAs avoided given renal dysfunction. Follow trends, no great options, could consider adding alpha 1 blocker. 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  ?  ?  ?5. CKD V ? Long history of indecisiveness regarding initiation of dialysis.  Has been offered this in the past. Has consistently turned down HD   ?-- seen by Nephrology, signed off yesterday ?- Cr 4.93 today, from nephrology notes suspect this is her new baseline.  ?- nephorlogy restarted oral torsemide '100mg'$  daily yesterday ?  ?  ?6. Hypothyroidism: Was on 224 mcg of Synthroid at home.  TSH too low and T4 elevated.   ?-- reduced dose to 175 mcg daily ?  ?7. Anemia ?- Of note she is a Jehovah's Witness and continues to refuse blood products ?-- FOBT negative ?- received dose of aranesp 4/11. Iron per neprhology. ?Hgb 7.2 this AM ? ? ?[place] for discharge today after her loading dose of plavix.  ? ?  ? ?For questions or updates, please contact [place] ?Please consult [email or web address] for contact info under  ? ?  ?   ?Signed, ?Carlyle Dolly, MD  ?05/20/2021, 7:40 AM   ? ?

## 2021-05-21 ENCOUNTER — Other Ambulatory Visit: Payer: Self-pay | Admitting: *Deleted

## 2021-05-21 MED ORDER — HYDRALAZINE HCL 100 MG PO TABS: 100.0000 mg | ORAL_TABLET | Freq: Three times a day (TID) | ORAL | 0 refills | Status: DC

## 2021-05-22 ENCOUNTER — Ambulatory Visit (HOSPITAL_COMMUNITY)
Admission: RE | Admit: 2021-05-22 | Discharge: 2021-05-22 | Disposition: A | Discharge status: Home / Self Care | Payer: Medicare Other | Source: Ambulatory Visit | Attending: Internal Medicine | Admitting: Internal Medicine

## 2021-05-22 VITALS — BP 135/65 | HR 56 | Temp 97.1°F | Resp 18

## 2021-05-22 DIAGNOSIS — N185 Chronic kidney disease, stage 5: Secondary | ICD-10-CM | POA: Insufficient documentation

## 2021-05-22 LAB — POCT HEMOGLOBIN-HEMACUE: Hemoglobin: 9 g/dL — ABNORMAL LOW (ref 12.0–15.0)

## 2021-05-22 MED ORDER — EPOETIN ALFA-EPBX 40000 UNIT/ML IJ SOLN
INTRAMUSCULAR | Status: AC
  Filled 2021-05-22: qty 1

## 2021-05-22 MED ORDER — EPOETIN ALFA-EPBX 40000 UNIT/ML IJ SOLN
30000.0000 [IU] | INTRAMUSCULAR | Status: DC
  Administered 2021-05-22: 30000 [IU] via SUBCUTANEOUS

## 2021-05-22 NOTE — Progress Notes (Signed)
Patient C\o Left Arm Hard and painful where IV was when patient was in hospital.  Instructed patient to call physician and have assessed. ?

## 2021-05-23 LAB — PTH, INTACT AND CALCIUM
Calcium, Total (PTH): 9.8 mg/dL (ref 8.7–10.3)
PTH: 58 pg/mL (ref 15–65)

## 2021-05-24 ENCOUNTER — Ambulatory Visit: Payer: Medicare Other | Admitting: Physician Assistant

## 2021-05-24 ENCOUNTER — Ambulatory Visit: Payer: Medicare Other | Admitting: Physical Medicine and Rehabilitation

## 2021-05-28 ENCOUNTER — Other Ambulatory Visit: Payer: Self-pay | Admitting: Internal Medicine

## 2021-05-29 LAB — COMPLETE METABOLIC PANEL WITH GFR
AG Ratio: 1.4 (calc) (ref 1.0–2.5)
ALT: 14 U/L (ref 6–29)
AST: 17 U/L (ref 10–35)
Albumin: 3.8 g/dL (ref 3.6–5.1)
Alkaline phosphatase (APISO): 66 U/L (ref 37–153)
BUN/Creatinine Ratio: 19 (calc) (ref 6–22)
BUN: 94 mg/dL — ABNORMAL HIGH (ref 7–25)
CO2: 19 mmol/L — ABNORMAL LOW (ref 20–32)
Calcium: 10.2 mg/dL (ref 8.6–10.4)
Chloride: 106 mmol/L (ref 98–110)
Creat: 4.88 mg/dL — ABNORMAL HIGH (ref 0.60–1.00)
Globulin: 2.8 g/dL (calc) (ref 1.9–3.7)
Glucose, Bld: 144 mg/dL — ABNORMAL HIGH (ref 65–99)
Potassium: 4.5 mmol/L (ref 3.5–5.3)
Sodium: 140 mmol/L (ref 135–146)
Total Bilirubin: 0.4 mg/dL (ref 0.2–1.2)
Total Protein: 6.6 g/dL (ref 6.1–8.1)
eGFR: 9 mL/min/{1.73_m2} — ABNORMAL LOW (ref >=60)

## 2021-05-29 LAB — CBC
HCT: 31 % — ABNORMAL LOW (ref 35.0–45.0)
Hemoglobin: 9.4 g/dL — ABNORMAL LOW (ref 11.7–15.5)
MCH: 27 pg (ref 27.0–33.0)
MCHC: 30.3 g/dL — ABNORMAL LOW (ref 32.0–36.0)
MCV: 89.1 fL (ref 80.0–100.0)
MPV: 10.6 fL (ref 7.5–12.5)
Platelets: 178 10*3/uL (ref 140–400)
RBC: 3.48 10*6/uL — ABNORMAL LOW (ref 3.80–5.10)
RDW: 16.2 % — ABNORMAL HIGH (ref 11.0–15.0)
WBC: 4.8 10*3/uL (ref 3.8–10.8)

## 2021-05-29 LAB — T4, FREE: Free T4: 1.1 ng/dL (ref 0.8–1.8)

## 2021-05-29 LAB — TSH: TSH: 5.72 mIU/L — ABNORMAL HIGH (ref 0.40–4.50)

## 2021-06-02 ENCOUNTER — Encounter (HOSPITAL_COMMUNITY): Payer: Self-pay | Admitting: Emergency Medicine

## 2021-06-02 ENCOUNTER — Ambulatory Visit (HOSPITAL_COMMUNITY)
Admission: EM | Admit: 2021-06-02 | Discharge: 2021-06-02 | Disposition: A | Discharge status: Home / Self Care | Payer: Medicare Other | Attending: Internal Medicine | Admitting: Internal Medicine

## 2021-06-02 DIAGNOSIS — K6289 Other specified diseases of anus and rectum: Secondary | ICD-10-CM

## 2021-06-02 DIAGNOSIS — K5900 Constipation, unspecified: Secondary | ICD-10-CM

## 2021-06-02 MED ORDER — LIDOCAINE (ANORECTAL) 5 % EX CREA: 1.0000 | TOPICAL_CREAM | Freq: Every day | CUTANEOUS | 1 refills | Status: AC

## 2021-06-02 MED ORDER — SENNA 8.6 MG PO TABS: 2.0000 | ORAL_TABLET | Freq: Every day | ORAL | 1 refills | Status: DC

## 2021-06-02 MED ORDER — POLYETHYLENE GLYCOL 3350 17 GM/SCOOP PO POWD: 1.0000 | Freq: Once | ORAL | 0 refills | Status: AC

## 2021-06-02 NOTE — Discharge Instructions (Addendum)
I believe that your rectal pain is caused by constipation today.  I have prescribed you MiraLAX, senna, and lidocaine anal rectal cream.  This lidocaine in your rectal cream will help with your rectal pain significantly.  MiraLAX is a powder that I would like for you to take twice daily until your stools become normal and regular.  After your stools are regular, you may take this once daily.  Please take senna stool softener 2 pills every night.  You may apply the rectal cream up to 6 times a day.  Please apply this in the morning and after each bowel movement that you have. ? ?I have given you good Rx coupons for [facility] for these prescriptions.  Please take this with you to the pharmacy so that you can get your medications. ? ?Please come back to urgent care or follow-up with your primary care provider if you develop any new or worsening symptoms.  Please go to the ER if your symptoms are severe.  If you have not been able to poop in the next 3 days, please go to the emergency room. ?I have included information on increasing fiber in your diet to this packet.  Please read over this as this will help with your constipation as well. ?

## 2021-06-02 NOTE — ED Provider Notes (Signed)
?[facility] ? ? ? ?CSN: [id number] ?Arrival date & time: 06/02/21  1000 ? ? ?  ? ?History   ?Chief Complaint ?Chief Complaint  ?Patient presents with  ? Rectal Pain  ? ? ?HPI ?Tiffany [name] is a 71 y.o. female.  ? ?Patient presents to urgent care for evaluation of her rectal pain that started 4 days ago after she had a "hard stool". She was seen 6 days ago for constipation and she was prescribed Mirilax. She took prune juice warmed and used caster oil at home, which helped the most with her constipation. 3 days ago, she reports having a regular bowel movement. She has not moved her bowels since then. Denies abdominal pain, nausea, dizziness, vomiting, fever, and rectal bleeding. She has never been constipated before. She was recently hospitalized due to a heart attack on April 4-May 20, 2021 where she got "3 stents".  Her water intake is limited to 32 oz per day due to her CHF and end-stage renal disease.  She rates her rectal pain at a 10 on a scale of 0-10 at this time.  Laying on her back makes her pain worse, but being in a seated position does not relieve her pain.  She recently started Plavix due to her recent cardiac hospitalization and has not started any other new medications since her constipation started.  Denies any other aggravating or relieving factors at this time. ? ? ? ?Past Medical History:  ?Diagnosis Date  ? ALLERGIC RHINITIS   ? Arthritis   ? CHF (congestive heart failure) ([place])   ? CKD (chronic kidney disease)   ? COVID 01/21/2021  ? + HOME TEST  HAD COUGH CONGESTION AND MALAISE, OCC CONGESTION NOW  ? Heart murmur   ? at birth  ? Hyperlipidemia   ? Hypertension   ? Hypothyroid   ? Iron deficiency anemia   ? Obesity   ? OSA on CPAP   ? Refusal of blood transfusions as patient is Jehovah's Witness   ? Shingles 11/2020  ? LEFT BACK  ? Type 2 dm with insulin use   ? checks abg qday runs 125  ? Wears glasses   ? ? ?Patient Active Problem List  ? Diagnosis Date Noted  ? STEMI (ST  elevation myocardial infarction) ([place]) 05/12/2021  ? Volume overload 09/30/2020  ? Symptomatic anemia 09/29/2020  ? ESRD (end stage renal disease) ([place]) 06/04/2020  ? Acute on chronic diastolic (congestive) heart failure ([place]) 04/26/2020  ? Type 2 diabetes mellitus with stage 5 chronic kidney disease ([place]) 04/26/2020  ? Acute CHF (congestive heart failure) ([place]) 02/01/2020  ? S/P left TKA 07/01/2019  ? Status post total left knee replacement 07/01/2019  ? Hyperlipidemia 09/12/2016  ? Type 2 diabetes mellitus without complications ([place]) 25/95/6387  ? Tachycardia 08/26/2016  ? Palpitation 08/26/2016  ? SOB (shortness of breath) 08/26/2016  ? CKD (chronic kidney disease), stage V ([place]) 04/28/2014  ? Chronic diastolic heart failure ([place]) 05/05/2013  ? Essential hypertension 05/05/2013  ? HEART MURMUR, SYSTOLIC 56/43/3295  ? Hypothyroidism 02/12/2007  ? OBESITY 01/08/2007  ? Obstructive sleep apnea 01/08/2007  ? Seasonal and perennial allergic rhinitis 01/08/2007  ? ? ?Past Surgical History:  ?Procedure Laterality Date  ? BREAST BIOPSY    ? left breast per pt; benign 20+ yrs ago  ? BREAST REDUCTION SURGERY  1982  ? COLONOSCOPY WITH PROPOFOL N/A 10/24/2020  ? Procedure: COLONOSCOPY WITH PROPOFOL;  Surgeon: Ronnette Juniper, MD;  Location: [facility];  Service: Gastroenterology;  Laterality: N/A;  ? CORONARY STENT INTERVENTION N/A 05/12/2021  ? Procedure: CORONARY STENT INTERVENTION;  Surgeon: Early Osmond, MD;  Location: [facility];  Service: Cardiovascular;  Laterality: N/A;  ? CORONARY/GRAFT ACUTE MI REVASCULARIZATION N/A 05/12/2021  ? Procedure: Coronary/Graft Acute MI Revascularization;  Surgeon: Early Osmond, MD;  Location: [facility];  Service: Cardiovascular;  Laterality: N/A;  ? KNEE SURGERY Bilateral   ? x 2  ? LEFT HEART CATH AND CORONARY ANGIOGRAPHY N/A 05/12/2021  ? Procedure: LEFT HEART CATH AND CORONARY ANGIOGRAPHY;  Surgeon: Early Osmond, MD;  Location: [facility];  Service:  Cardiovascular;  Laterality: N/A;  ? LUMBAR DISC SURGERY    ? x 2  ? POLYPECTOMY  10/24/2020  ? Procedure: POLYPECTOMY;  Surgeon: Ronnette Juniper, MD;  Location: [facility];  Service: Gastroenterology;;  ? REDUCTION MAMMAPLASTY Bilateral 1985  ? TOE SURGERY    ? yrs ago per pt on 02-02-2021  ? TOTAL ABDOMINAL HYSTERECTOMY  1997  ? partial hysterectomy- still has ovaries  ? TOTAL KNEE ARTHROPLASTY Left 07/01/2019  ? Procedure: TOTAL KNEE ARTHROPLASTY;  Surgeon: Paralee Cancel, MD;  Location: [facility];  Service: Orthopedics;  Laterality: Left;  70 min ?NO BLOOD PRODUCTS!!  ? TREATMENT FISTULA ANAL    ? yrs ago per pt 0n 02-02-2021  ? TRIGGER FINGER RELEASE Left 02/08/2021  ? Procedure: Left Ring trigger finger release, left ring finger volar ganglion cyst excision;  Surgeon: Orene Desanctis, MD;  Location: [facility];  Service: Orthopedics;  Laterality: Left;  with local anesthesia  ? ? ?OB History   ?No obstetric history on file. ?  ? ? ? ?Home Medications   ? ?Prior to Admission medications   ?Medication Sig Start Date End Date Taking? Authorizing Provider  ?Lidocaine, Anorectal, (RECTICARE) 5 % CREA Apply 1 Tube topically 6 (six) times daily for 7 days. 06/02/21 06/09/21 Yes Talbot Grumbling, FNP  ?senna (SENOKOT) 8.6 MG TABS tablet Take 2 tablets (17.2 mg total) by mouth at bedtime. 06/02/21  Yes Talbot Grumbling, FNP  ?albuterol (VENTOLIN HFA) 108 (90 Base) MCG/ACT inhaler Inhale 2 puffs into the lungs every 6 (six) hours as needed for wheezing or shortness of breath.    [provider]  ?amLODipine (NORVASC) 10 MG tablet Take 10 mg by mouth 2 (two) times daily.    [provider]  ?aspirin EC 81 MG tablet Take 81 mg by mouth every evening. Swallow whole.    [provider]  ?atorvastatin (LIPITOR) 80 MG tablet Take 1 tablet (80 mg total) by mouth at bedtime. 05/20/21   Cheryln Manly, NP  ?B Complex-C (B-COMPLEX WITH VITAMIN C) tablet Take 1 tablet by mouth in the  morning.    [provider]  ?budesonide-formoterol (SYMBICORT) 160-4.5 MCG/ACT inhaler Inhale 2 puffs then rinse mouth, twice daily ?Patient taking differently: Inhale 2 puffs into the lungs 2 (two) times daily as needed (wheezing or shortness of breath). 09/25/20   Baird Lyons D, MD  ?calcitRIOL (ROCALTROL) 0.5 MCG capsule Take 0.5 mcg by mouth in the morning. 08/03/20   [provider]  ?carvedilol (COREG) 25 MG tablet Take 1 tablet (25 mg total) by mouth 2 (two) times daily with a meal. 05/20/21   Cheryln Manly, NP  ?cetirizine (ZYRTEC) 10 MG tablet Take 10 mg by mouth daily as needed for allergies.    [provider]  ?cloNIDine (CATAPRES - DOSED IN MG/24  HR) 0.3 mg/24hr patch Place 0.3 mg onto the skin every Friday.    [provider]  ?clopidogrel (PLAVIX) 75 MG tablet Take 1 tablet (75 mg total) by mouth daily. 05/21/21   Cheryln Manly, NP  ?hydrALAZINE (APRESOLINE) 100 MG tablet Take 1 tablet (100 mg total) by mouth every 8 (eight) hours. 05/21/21   Belva Crome, MD  ?levothyroxine (SYNTHROID) 175 MCG tablet Take 1 tablet (175 mcg total) by mouth daily at 6 (six) AM. 05/21/21   Cheryln Manly, NP  ?Multiple Vitamins-Minerals (PRESERVISION AREDS 2 PO) Take 1 tablet by mouth in the morning.    [provider]  ?nitroGLYCERIN (NITROSTAT) 0.4 MG SL tablet Place 1 tablet (0.4 mg total) under the tongue every 5 (five) minutes x 3 doses as needed for chest pain. 05/20/21   Cheryln Manly, NP  ?Omega-3 Fatty Acids (FISH OIL) 500 MG CAPS Take 500 mg by mouth at bedtime.    [provider]  ?ondansetron (ZOFRAN) 4 MG tablet Take 1 tablet (4 mg total) by mouth every 6 (six) hours as needed for nausea or vomiting. 05/20/21   Darreld Mclean, PA-C  ?Propylene Glycol (SYSTANE COMPLETE) 0.6 % SOLN Place 1 drop into both eyes in the morning, at noon, in the evening, and at bedtime.    [provider]  ?torsemide (DEMADEX) 100 MG tablet Take 1  tablet (100 mg total) by mouth daily. 05/20/21   Cheryln Manly, NP  ?zolpidem (AMBIEN) 10 MG tablet Take 10 mg by mouth at bedtime.    [provider]  ? ? ?Family History ?Family History  ?Problem Rel

## 2021-06-02 NOTE — ED Triage Notes (Signed)
Rectal pain since hard stool Wednesday. Denies rectal bleeding, hx of hemorrhoids, dysuria ?

## 2021-06-10 NOTE — Progress Notes (Signed)
?Cardiology Office Note:   ? ?Date:  06/11/2021  ? ?ID:  Tiffany [name], DOB 29-May-1950, MRN [id number] ? ?PCP:  Nolene Ebbs, MD  ?Cardiologist:  Sinclair Grooms, MD  ? ?Referring MD: Nolene Ebbs, MD  ? ?Chief Complaint  ?Patient presents with  ? Coronary Artery Disease  ? Follow-up  ?  And stage kidney disease ?Obstructive sleep apnea ?  ? Congestive Heart Failure  ?  Hypertrophic cardiomyopathy with diastolic heart failure  ? ? ?History of Present Illness:   ? ?Tiffany [name] is a 71 y.o. female with a hx of essential hypertension, obesity, OSA, chronic diastolic HF, end stage renal disease on dialysis, and STEMI 05/11/2021 treated with PDA DES. ? ? ?Tiffany [name] has recovered from inferior myocardial infarction that was treated with acute mechanical intervention by Dr. Philbert Riser in April 2023.  She has had no recurrent discomfort.  No bleeding on dual antiplatelet therapy.  She denies shortness of breath.  The right radial cath site is on remarkable and no associated complications. ? ?Past Medical History:  ?Diagnosis Date  ? ALLERGIC RHINITIS   ? Arthritis   ? CHF (congestive heart failure) ([place])   ? CKD (chronic kidney disease)   ? COVID 01/21/2021  ? + HOME TEST  HAD COUGH CONGESTION AND MALAISE, OCC CONGESTION NOW  ? Heart murmur   ? at birth  ? Hyperlipidemia   ? Hypertension   ? Hypothyroid   ? Iron deficiency anemia   ? Obesity   ? OSA on CPAP   ? Refusal of blood transfusions as patient is Jehovah's Witness   ? Shingles 11/2020  ? LEFT BACK  ? Type 2 dm with insulin use   ? checks abg qday runs 125  ? Wears glasses   ? ? ?Past Surgical History:  ?Procedure Laterality Date  ? BREAST BIOPSY    ? left breast per pt; benign 20+ yrs ago  ? BREAST REDUCTION SURGERY  1982  ? COLONOSCOPY WITH PROPOFOL N/A 10/24/2020  ? Procedure: COLONOSCOPY WITH PROPOFOL;  Surgeon: Ronnette Juniper, MD;  [facility];  Service: Gastroenterology;  Laterality: N/A;  ? CORONARY STENT INTERVENTION N/A 05/12/2021  ?  Procedure: CORONARY STENT INTERVENTION;  Surgeon: Early Osmond, MD;  Location: [facility];  Service: Cardiovascular;  Laterality: N/A;  ? CORONARY/GRAFT ACUTE MI REVASCULARIZATION N/A 05/12/2021  ? Procedure: Coronary/Graft Acute MI Revascularization;  Surgeon: Early Osmond, MD;  Location: [facility];  Service: Cardiovascular;  Laterality: N/A;  ? KNEE SURGERY Bilateral   ? x 2  ? LEFT HEART CATH AND CORONARY ANGIOGRAPHY N/A 05/12/2021  ? Procedure: LEFT HEART CATH AND CORONARY ANGIOGRAPHY;  Surgeon: Early Osmond, MD;  Location: [facility];  Service: Cardiovascular;  Laterality: N/A;  ? LUMBAR DISC SURGERY    ? x 2  ? POLYPECTOMY  10/24/2020  ? Procedure: POLYPECTOMY;  Surgeon: Ronnette Juniper, MD;  Location: [facility];  Service: Gastroenterology;;  ? REDUCTION MAMMAPLASTY Bilateral 1985  ? TOE SURGERY    ? yrs ago per pt on 02-02-2021  ? TOTAL ABDOMINAL HYSTERECTOMY  1997  ? partial hysterectomy- still has ovaries  ? TOTAL KNEE ARTHROPLASTY Left 07/01/2019  ? Procedure: TOTAL KNEE ARTHROPLASTY;  Surgeon: Paralee Cancel, MD;  Location: [facility];  Service: Orthopedics;  Laterality: Left;  70 min ?NO BLOOD PRODUCTS!!  ? TREATMENT FISTULA ANAL    ? yrs ago per pt 0n 02-02-2021  ? TRIGGER FINGER RELEASE  Left 02/08/2021  ? Procedure: Left Ring trigger finger release, left ring finger volar ganglion cyst excision;  Surgeon: Orene Desanctis, MD;  Location: [facility];  Service: Orthopedics;  Laterality: Left;  with local anesthesia  ? ? ?Current Medications: ?Current Meds  ?Medication Sig  ? albuterol (VENTOLIN HFA) 108 (90 Base) MCG/ACT inhaler Inhale 2 puffs into the lungs every 6 (six) hours as needed for wheezing or shortness of breath.  ? amLODipine (NORVASC) 10 MG tablet Take 10 mg by mouth 2 (two) times daily.  ? aspirin EC 81 MG tablet Take 81 mg by mouth every evening. Swallow whole.  ? atorvastatin (LIPITOR) 80 MG tablet Take 1 tablet (80 mg total) by mouth at bedtime.  ? B  Complex-C (B-COMPLEX WITH VITAMIN C) tablet Take 1 tablet by mouth in the morning.  ? budesonide-formoterol (SYMBICORT) 160-4.5 MCG/ACT inhaler Inhale 2 puffs then rinse mouth, twice daily (Patient taking differently: Inhale 2 puffs into the lungs 2 (two) times daily as needed (wheezing or shortness of breath).)  ? calcitRIOL (ROCALTROL) 0.5 MCG capsule Take 0.5 mcg by mouth in the morning.  ? carvedilol (COREG) 25 MG tablet Take 1 tablet (25 mg total) by mouth 2 (two) times daily with a meal.  ? cetirizine (ZYRTEC) 10 MG tablet Take 10 mg by mouth daily as needed for allergies.  ? cloNIDine (CATAPRES - DOSED IN MG/24 HR) 0.3 mg/24hr patch Place 0.3 mg onto the skin every Friday.  ? clopidogrel (PLAVIX) 75 MG tablet Take 1 tablet (75 mg total) by mouth daily.  ? hydrALAZINE (APRESOLINE) 100 MG tablet Take 1 tablet (100 mg total) by mouth every 8 (eight) hours.  ? levothyroxine (SYNTHROID) 175 MCG tablet Take 1 tablet (175 mcg total) by mouth daily at 6 (six) AM.  ? Multiple Vitamins-Minerals (PRESERVISION AREDS 2 PO) Take 1 tablet by mouth in the morning.  ? nitroGLYCERIN (NITROSTAT) 0.4 MG SL tablet Place 1 tablet (0.4 mg total) under the tongue every 5 (five) minutes x 3 doses as needed for chest pain.  ? Omega-3 Fatty Acids (FISH OIL) 500 MG CAPS Take 500 mg by mouth at bedtime.  ? ondansetron (ZOFRAN) 4 MG tablet Take 1 tablet (4 mg total) by mouth every 6 (six) hours as needed for nausea or vomiting.  ? Propylene Glycol (SYSTANE COMPLETE) 0.6 % SOLN Place 1 drop into both eyes in the morning, at noon, in the evening, and at bedtime.  ? senna (SENOKOT) 8.6 MG TABS tablet Take 2 tablets (17.2 mg total) by mouth at bedtime.  ? torsemide (DEMADEX) 100 MG tablet Take 100 mg by mouth daily. Pt takes 100 mg 1 tablet on Mondays, Wednesday, Friday, and Sunday, and take 1 table 150 mg on Tuesday, Thursdays, and Saturdays.  ? zolpidem (AMBIEN) 10 MG tablet Take 10 mg by mouth at bedtime.  ?  ? ?Allergies:   Other and  Sulfa antibiotics  ? ?Social History  ? ?Socioeconomic History  ? Marital status: Single  ?  Spouse name: Not on file  ? Number of children: Not on file  ? Years of education: Not on file  ? Highest education level: Not on file  ?Occupational History  ? Occupation: school bus driver  ?Tobacco Use  ? Smoking status: Never  ? Smokeless tobacco: Never  ?Vaping Use  ? Vaping Use: Never used  ?Substance and Sexual Activity  ? Alcohol use: Not Currently  ? Drug use: No  ? Sexual activity: Not Currently  ?  Birth control/protection: Surgical  ?Other Topics Concern  ? Not on file  ?Social History Narrative  ? Not on file  ? ?Social Determinants of Health  ? ?Financial Resource Strain: Not on file  ?Food Insecurity: Not on file  ?Transportation Needs: Not on file  ?Physical Activity: Not on file  ?Stress: Not on file  ?Social Connections: Not on file  ?  ? ?Family History: ?The patient's family history includes Congestive Heart Failure in her mother; Diabetes in her sister; Other in her sister; Prostate cancer in her father. There is no history of Breast cancer. ? ?ROS:   ?Please see the history of present illness.    ?She is planning to have peritoneal dialysis.  She states that kidney transplantation has not been a conversation she has had with her nephrologist, Dr. Johnney Ou.  All other systems reviewed and are negative. ? ?EKGs/Labs/Other Studies Reviewed:   ? ?The following studies were reviewed today: ? ?Diagnostic ?Dominance: Right ?Intervention ?  ?  ?Echo: 05/12/21 ?  ?IMPRESSIONS  ? 1. Left ventricular ejection fraction, by estimation, is 70 to 75%. The  ?left ventricle has hyperdynamic function. The left ventricle has no  ?regional wall motion abnormalities. There is severe concentric left  ?ventricular hypertrophy (LV PW 1.7 cm and LV IVS 1.5 cm). Left ventricular  ?diastolic function could not be evaluated.  ? 2. Moderate calcific mitral valve disease is present. MG 6.3 mmHG @ 69  ?bpm. There is calcified chordal  tissue under the MV which does demonstrate  ?some mobility. 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 The mitral valve is  ?degenerative. Trivial mitral valve regurgitation. Moderate

## 2021-06-11 ENCOUNTER — Encounter: Payer: Self-pay | Admitting: Interventional Cardiology

## 2021-06-11 ENCOUNTER — Ambulatory Visit: Payer: Medicare Other | Admitting: Interventional Cardiology

## 2021-06-11 VITALS — BP 118/62 | HR 51 | Ht 69.0 in | Wt 233.6 lb

## 2021-06-11 DIAGNOSIS — G4733 Obstructive sleep apnea (adult) (pediatric): Secondary | ICD-10-CM

## 2021-06-11 DIAGNOSIS — I10 Essential (primary) hypertension: Secondary | ICD-10-CM

## 2021-06-11 DIAGNOSIS — N185 Chronic kidney disease, stage 5: Secondary | ICD-10-CM

## 2021-06-11 DIAGNOSIS — I2510 Atherosclerotic heart disease of native coronary artery without angina pectoris: Secondary | ICD-10-CM

## 2021-06-11 DIAGNOSIS — I5032 Chronic diastolic (congestive) heart failure: Secondary | ICD-10-CM | POA: Diagnosis not present

## 2021-06-11 DIAGNOSIS — E785 Hyperlipidemia, unspecified: Secondary | ICD-10-CM

## 2021-06-11 NOTE — Patient Instructions (Signed)
Medication Instructions:  ?Your physician recommends that you continue on your current medications as directed. Please refer to the Current Medication list given to you today. ? ?*If you need a refill on your cardiac medications before your next appointment, please call your pharmacy* ? ?Lab Work: ?NONE ? ?Testing/Procedures: ?NONE ? ?Follow-Up: ?At [facility], you and your health needs are our priority.  As part of our continuing mission to provide you with exceptional heart care, we have created designated Provider Care Teams.  These Care Teams include your primary Cardiologist (physician) and Advanced Practice Providers (APPs -  Physician Assistants and Nurse Practitioners) who all work together to provide you with the care you need, when you need it. ? ?Your next appointment:   ?4-6 month(s) ? ?The format for your next appointment:   ?In Person ? ?Provider:   ?Sinclair Grooms, MD { ? ? ?Important Information About Sugar ? ? ? ? ?  ?

## 2021-06-19 ENCOUNTER — Ambulatory Visit (HOSPITAL_COMMUNITY)
Admission: RE | Admit: 2021-06-19 | Discharge: 2021-06-19 | Disposition: A | Discharge status: Home / Self Care | Payer: Medicare Other | Source: Ambulatory Visit | Attending: Internal Medicine | Admitting: Internal Medicine

## 2021-06-19 VITALS — BP 148/71 | HR 51 | Temp 97.5°F | Resp 18

## 2021-06-19 DIAGNOSIS — N185 Chronic kidney disease, stage 5: Secondary | ICD-10-CM | POA: Diagnosis present

## 2021-06-19 LAB — IRON AND TIBC
Iron: 133 ug/dL (ref 28–170)
Saturation Ratios: 50 % — ABNORMAL HIGH (ref 10.4–31.8)
TIBC: 267 ug/dL (ref 250–450)
UIBC: 134 ug/dL

## 2021-06-19 LAB — RENAL FUNCTION PANEL
Albumin: 3.5 g/dL (ref 3.5–5.0)
Anion gap: 11 (ref 5–15)
BUN: 112 mg/dL — ABNORMAL HIGH (ref 8–23)
CO2: 21 mmol/L — ABNORMAL LOW (ref 22–32)
Calcium: 10 mg/dL (ref 8.9–10.3)
Chloride: 108 mmol/L (ref 98–111)
Creatinine, Ser: 4.86 mg/dL — ABNORMAL HIGH (ref 0.44–1.00)
GFR, Estimated: 9 mL/min — ABNORMAL LOW (ref >60)
Glucose, Bld: 149 mg/dL — ABNORMAL HIGH (ref 70–99)
Phosphorus: 4.9 mg/dL — ABNORMAL HIGH (ref 2.5–4.6)
Potassium: 4.2 mmol/L (ref 3.5–5.1)
Sodium: 140 mmol/L (ref 135–145)

## 2021-06-19 LAB — POCT HEMOGLOBIN-HEMACUE: Hemoglobin: 9 g/dL — ABNORMAL LOW (ref 12.0–15.0)

## 2021-06-19 LAB — FERRITIN: Ferritin: 847 ng/mL — ABNORMAL HIGH (ref 11–307)

## 2021-06-19 MED ORDER — EPOETIN ALFA-EPBX 40000 UNIT/ML IJ SOLN
INTRAMUSCULAR | Status: AC
  Filled 2021-06-19: qty 1

## 2021-06-19 MED ORDER — EPOETIN ALFA-EPBX 40000 UNIT/ML IJ SOLN
30000.0000 [IU] | INTRAMUSCULAR | Status: DC
  Administered 2021-06-19: 30000 [IU] via SUBCUTANEOUS

## 2021-06-20 ENCOUNTER — Ambulatory Visit: Payer: Self-pay

## 2021-06-20 ENCOUNTER — Encounter: Payer: Self-pay | Admitting: Physical Medicine and Rehabilitation

## 2021-06-20 ENCOUNTER — Ambulatory Visit (INDEPENDENT_AMBULATORY_CARE_PROVIDER_SITE_OTHER): Payer: Medicare Other | Admitting: Physical Medicine and Rehabilitation

## 2021-06-20 VITALS — BP 161/84 | HR 64

## 2021-06-20 DIAGNOSIS — M5416 Radiculopathy, lumbar region: Secondary | ICD-10-CM

## 2021-06-20 LAB — PTH, INTACT AND CALCIUM
Calcium, Total (PTH): 9.9 mg/dL (ref 8.7–10.3)
PTH: 128 pg/mL — ABNORMAL HIGH (ref 15–65)

## 2021-06-20 MED ORDER — METHYLPREDNISOLONE ACETATE 80 MG/ML IJ SUSP
80.0000 mg | Freq: Once | INTRAMUSCULAR | Status: AC
  Administered 2021-06-20: 80 mg

## 2021-06-20 NOTE — Patient Instructions (Signed)

## 2021-06-20 NOTE — Progress Notes (Signed)
Pt state lower back pain that travels to her buttocks and down left legs. Pt state sitting makes the pain worse. Pt state she takes over the counter pain meds pain to help ease her pain.  Numeric Pain Rating Scale and Functional Assessment Average Pain 9   In the last MONTH (on 0-10 scale) has pain interfered with the following?  1. General activity like being  able to carry out your everyday physical activities such as walking, climbing stairs, carrying groceries, or moving a chair?  Rating(10)   +Driver, -BT, -Dye Allergies.

## 2021-07-04 NOTE — Progress Notes (Signed)
Tiffany [name] - 71 y.o. female MRN [id number]  Date of birth: 1950/07/31  Office Visit Note: Visit Date: 06/20/2021 PCP: Nolene Ebbs, MD Referred by: Nolene Ebbs, MD  Subjective: Chief Complaint  Patient presents with   Lower Back - Pain   Left Leg - Pain   HPI:  Tiffany [name] is a 71 y.o. female who comes in today at the request of Barnet Pall, FNP for planned Bilateral S1-2 Lumbar Transforaminal epidural steroid injection with fluoroscopic guidance.  The patient has failed conservative care including home exercise, medications, time and activity modification.  This injection will be diagnostic and hopefully therapeutic.  Please see requesting physician notes for further details and justification.  ROS Otherwise per HPI.  Assessment & Plan: Visit Diagnoses:    ICD-10-CM   1. Lumbar radiculopathy  M54.16 XR C-ARM NO REPORT    Epidural Steroid injection    methylPREDNISolone acetate (DEPO-MEDROL) injection 80 mg      Plan: No additional findings.   Meds & Orders:  Meds ordered this encounter  Medications   methylPREDNISolone acetate (DEPO-MEDROL) injection 80 mg    Orders Placed This Encounter  Procedures   XR C-ARM NO REPORT   Epidural Steroid injection    Follow-up: Return if symptoms worsen or fail to improve.   Procedures: No procedures performed  S1 Lumbosacral Transforaminal Epidural Steroid Injection - Sub-Pedicular Approach with Fluoroscopic Guidance   Patient: Tiffany [name]      Date of Birth: [birth year] MRN: [id number] PCP: Nolene Ebbs, MD      Visit Date: 06/20/2021   Universal Protocol:    Date/Time: 05/31/236:05 AM  Consent Given By: the patient  Position:  PRONE  Additional Comments: Vital signs were monitored before and after the procedure. Patient was prepped and draped in the usual sterile fashion. The correct patient, procedure, and site was verified.   Injection Procedure Details:  Procedure Site One Meds  Administered:  Meds ordered this encounter  Medications   methylPREDNISolone acetate (DEPO-MEDROL) injection 80 mg    Laterality: Bilateral  Location/Site:  S1 Foramen   Needle size: 22 ga.  Needle type: Spinal  Needle Placement: Transforaminal  Findings:   -Comments: Excellent flow of contrast along the nerve, nerve root and into the epidural space.  Epidurogram: Contrast epidurogram showed no nerve root cut off or restricted flow pattern.  Procedure Details: After squaring off the sacral end-plate to get a true AP view, the C-arm was positioned so that the best possible view of the S1 foramen was visualized. The soft tissues overlying this structure were infiltrated with 2-3 ml. of 1% Lidocaine without Epinephrine.    The spinal needle was inserted toward the target using a "trajectory" view along the fluoroscope beam.  Under AP and lateral visualization, the needle was advanced so it did not puncture dura. Biplanar projections were used to confirm position. Aspiration was confirmed to be negative for CSF and/or blood. A 1-2 ml. volume of Isovue-250 was injected and flow of contrast was noted at each level. Radiographs were obtained for documentation purposes.   After attaining the desired flow of contrast documented above, a 0.5 to 1.0 ml test dose of 0.25% Marcaine was injected into each respective transforaminal space.  The patient was observed for 90 seconds post injection.  After no sensory deficits were reported, and normal lower extremity motor function was noted,   the above injectate was administered so that equal amounts of the injectate were placed at each foramen (level)  into the transforaminal epidural space.   Additional Comments:  No complications occurred Dressing: Band-Aid with 2 x 2 sterile gauze    Post-procedure details: Patient was observed during the procedure. Post-procedure instructions were reviewed.  Patient left the clinic in stable condition.    Clinical History: EXAM: MRI LUMBAR SPINE WITHOUT CONTRAST   TECHNIQUE: Multiplanar, multisequence MR imaging of the lumbar spine was performed. No intravenous contrast was administered.   COMPARISON:  09/27/2017.   FINDINGS: Segmentation:  Standard.   Alignment:  No substantial sagittal subluxation   Vertebrae: No specific evidence of fracture or discitis/osteomyelitis. L3-L4 PLIF. No evidence of abnormal enhancement.   Conus medullaris and cauda equina: Conus extends to the T12 level. Conus appears normal. No evidence of abnormal enhancement of the conus or cauda equina.   Paraspinal and other soft tissues: Bilateral renal cysts.   Disc levels:   Motion limited assessment.   T12-L1: No significant disc protrusion, foraminal stenosis, or canal stenosis.   L1-L2: No significant disc protrusion, foraminal stenosis, or canal stenosis.   L2-L3: Disc bulge, facet arthropathy, and ligamentum flavum thickening. Resulting mild canal stenosis, similar. Mild right foraminal stenosis.   L3-L4: Previous PLIF with solid fusion across the disc space. Patent canal and foramina.   L4-L5: Previous left laminectomy endplate spurring and mild disc bulging. Similar mild bilateral subarticular recess narrowing without significant canal stenosis. Evaluation of foramina is limited by artifact without evidence of compressive foraminal stenosis.   L5-S1: Posterior disc bulge and endplate spurring. Bilateral facet arthropathy. Similar mild to moderate canal stenosis and bilateral subarticular recess stenosis without definite impingement. Similar probably mild bilateral foraminal stenosis without definite impingement.   IMPRESSION: 1. Motion limited study with similar multilevel degenerative change, as detailed above. 2. Solid L3-L4 PLIF with patent canal and foramina at this level.     Electronically Signed   By: Margaretha Sheffield M.D.   On: 04/24/2021 08:35     Objective:   VS:  HT:    WT:   BMI:     BP:(!) 161/84  HR:64bpm  TEMP: ( )  RESP:  Physical Exam Vitals and nursing note reviewed.  Constitutional:      General: She is not in acute distress.    Appearance: Normal appearance. She is obese. She is not ill-appearing.  HENT:     Head: Normocephalic and atraumatic.     Right Ear: External ear normal.     Left Ear: External ear normal.  Eyes:     Extraocular Movements: Extraocular movements intact.  Cardiovascular:     Rate and Rhythm: Normal rate.     Pulses: Normal pulses.  Pulmonary:     Effort: Pulmonary effort is normal. No respiratory distress.  Abdominal:     General: There is no distension.     Palpations: Abdomen is soft.  Musculoskeletal:        General: Tenderness present.     Cervical back: Neck supple.     Right lower leg: No edema.     Left lower leg: No edema.     Comments: Patient has good distal strength with no pain over the greater trochanters.  No clonus or focal weakness.  Skin:    Findings: No erythema, lesion or rash.  Neurological:     General: No focal deficit present.     Mental Status: She is alert and oriented to person, place, and time.     Sensory: No sensory deficit.     Motor: No weakness  or abnormal muscle tone.     Coordination: Coordination normal.  Psychiatric:        Mood and Affect: Mood normal.        Behavior: Behavior normal.     Imaging: No results found.

## 2021-07-04 NOTE — Procedures (Signed)
S1 Lumbosacral Transforaminal Epidural Steroid Injection - Sub-Pedicular Approach with Fluoroscopic Guidance   Patient: Tiffany [name]      Date of Birth: 04-Jul-1950 MRN: [id number] PCP: Nolene Ebbs, MD      Visit Date: 06/20/2021   Universal Protocol:    Date/Time: 05/31/236:05 AM  Consent Given By: the patient  Position:  PRONE  Additional Comments: Vital signs were monitored before and after the procedure. Patient was prepped and draped in the usual sterile fashion. The correct patient, procedure, and site was verified.   Injection Procedure Details:  Procedure Site One Meds Administered:  Meds ordered this encounter  Medications   methylPREDNISolone acetate (DEPO-MEDROL) injection 80 mg    Laterality: Bilateral  Location/Site:  S1 Foramen   Needle size: 22 ga.  Needle type: Spinal  Needle Placement: Transforaminal  Findings:   -Comments: Excellent flow of contrast along the nerve, nerve root and into the epidural space.  Epidurogram: Contrast epidurogram showed no nerve root cut off or restricted flow pattern.  Procedure Details: After squaring off the sacral end-plate to get a true AP view, the C-arm was positioned so that the best possible view of the S1 foramen was visualized. The soft tissues overlying this structure were infiltrated with 2-3 ml. of 1% Lidocaine without Epinephrine.    The spinal needle was inserted toward the target using a "trajectory" view along the fluoroscope beam.  Under AP and lateral visualization, the needle was advanced so it did not puncture dura. Biplanar projections were used to confirm position. Aspiration was confirmed to be negative for CSF and/or blood. A 1-2 ml. volume of Isovue-250 was injected and flow of contrast was noted at each level. Radiographs were obtained for documentation purposes.   After attaining the desired flow of contrast documented above, a 0.5 to 1.0 ml test dose of 0.25% Marcaine was injected into  each respective transforaminal space.  The patient was observed for 90 seconds post injection.  After no sensory deficits were reported, and normal lower extremity motor function was noted,   the above injectate was administered so that equal amounts of the injectate were placed at each foramen (level) into the transforaminal epidural space.   Additional Comments:  No complications occurred Dressing: Band-Aid with 2 x 2 sterile gauze    Post-procedure details: Patient was observed during the procedure. Post-procedure instructions were reviewed.  Patient left the clinic in stable condition.

## 2021-07-11 ENCOUNTER — Telehealth: Payer: Self-pay | Admitting: Physical Medicine and Rehabilitation

## 2021-07-11 NOTE — Telephone Encounter (Signed)
Pt is calling to advise that the procedure did not work with Dr Ernestina Patches

## 2021-07-13 ENCOUNTER — Telehealth (HOSPITAL_COMMUNITY): Payer: Self-pay

## 2021-07-13 NOTE — Telephone Encounter (Signed)
Pt is not interested in the cardiac rehab program right now. Closed referral. 

## 2021-07-16 ENCOUNTER — Other Ambulatory Visit: Payer: Self-pay | Admitting: Cardiology

## 2021-07-16 ENCOUNTER — Ambulatory Visit: Payer: Medicare Other | Admitting: Physical Medicine and Rehabilitation

## 2021-07-17 ENCOUNTER — Encounter: Payer: Self-pay | Admitting: Physical Medicine and Rehabilitation

## 2021-07-17 ENCOUNTER — Ambulatory Visit (HOSPITAL_COMMUNITY)
Admission: RE | Admit: 2021-07-17 | Discharge: 2021-07-17 | Disposition: A | Discharge status: Home / Self Care | Payer: Medicare Other | Source: Ambulatory Visit | Attending: Internal Medicine | Admitting: Internal Medicine

## 2021-07-17 ENCOUNTER — Ambulatory Visit: Payer: Medicare Other | Admitting: Physical Medicine and Rehabilitation

## 2021-07-17 VITALS — BP 123/71 | HR 58

## 2021-07-17 VITALS — BP 134/67 | HR 51 | Temp 97.5°F | Resp 18

## 2021-07-17 DIAGNOSIS — N185 Chronic kidney disease, stage 5: Secondary | ICD-10-CM | POA: Diagnosis present

## 2021-07-17 DIAGNOSIS — M961 Postlaminectomy syndrome, not elsewhere classified: Secondary | ICD-10-CM

## 2021-07-17 DIAGNOSIS — M5416 Radiculopathy, lumbar region: Secondary | ICD-10-CM | POA: Diagnosis not present

## 2021-07-17 DIAGNOSIS — M5116 Intervertebral disc disorders with radiculopathy, lumbar region: Secondary | ICD-10-CM

## 2021-07-17 DIAGNOSIS — Z981 Arthrodesis status: Secondary | ICD-10-CM | POA: Diagnosis not present

## 2021-07-17 LAB — IRON AND TIBC
Iron: 103 ug/dL (ref 28–170)
Saturation Ratios: 36 % — ABNORMAL HIGH (ref 10.4–31.8)
TIBC: 284 ug/dL (ref 250–450)
UIBC: 181 ug/dL

## 2021-07-17 LAB — RENAL FUNCTION PANEL
Albumin: 3.7 g/dL (ref 3.5–5.0)
Anion gap: 15 (ref 5–15)
BUN: 126 mg/dL — ABNORMAL HIGH (ref 8–23)
CO2: 22 mmol/L (ref 22–32)
Calcium: 10.2 mg/dL (ref 8.9–10.3)
Chloride: 105 mmol/L (ref 98–111)
Creatinine, Ser: 6.5 mg/dL — ABNORMAL HIGH (ref 0.44–1.00)
GFR, Estimated: 6 mL/min — ABNORMAL LOW (ref >60)
Glucose, Bld: 121 mg/dL — ABNORMAL HIGH (ref 70–99)
Phosphorus: 6.1 mg/dL — ABNORMAL HIGH (ref 2.5–4.6)
Potassium: 4.2 mmol/L (ref 3.5–5.1)
Sodium: 142 mmol/L (ref 135–145)

## 2021-07-17 LAB — POCT HEMOGLOBIN-HEMACUE: Hemoglobin: 9.1 g/dL — ABNORMAL LOW (ref 12.0–15.0)

## 2021-07-17 LAB — FERRITIN: Ferritin: 735 ng/mL — ABNORMAL HIGH (ref 11–307)

## 2021-07-17 MED ORDER — EPOETIN ALFA-EPBX 40000 UNIT/ML IJ SOLN: 40000.0000 [IU] | Freq: Once | INTRAMUSCULAR | Status: AC

## 2021-07-17 MED ORDER — EPOETIN ALFA-EPBX 40000 UNIT/ML IJ SOLN
INTRAMUSCULAR | Status: AC
  Administered 2021-07-17: 40000 [IU] via SUBCUTANEOUS
  Filled 2021-07-17: qty 1

## 2021-07-17 MED ORDER — EPOETIN ALFA-EPBX 40000 UNIT/ML IJ SOLN: 30000.0000 [IU] | INTRAMUSCULAR | Status: DC

## 2021-07-17 NOTE — Progress Notes (Unsigned)
Tiffany [name] - 71 y.o. female MRN [id number]  Date of birth: 02-Sep-1950  Office Visit Note: Visit Date: 07/17/2021 PCP: Nolene Ebbs, MD Referred by: Nolene Ebbs, MD  Subjective: Chief Complaint  Patient presents with   Middle Back - Pain   Lower Back - Pain   Left Leg - Pain   HPI: Tiffany [name] is a 71 y.o. female who comes in today for evaluation of chronic, worsening and severe bilateral lower back pain radiating to buttocks and down posterior thighs to knees, left greater than right. Patient reports pain has been ongoing for several years and is exacerbated by walking, sitting and laying flat. She describes her pain as aching, sore and shooting, currently rates as 9 out of 10. Patient reports some relief of pain with home exercise regimen, rest and use of over the counter medications as needed. Patient states she has tried Gabapentin in the past and states she is unable to tolerate this medication. Patient also has history of chronic pain management with Dr. Niel Hummer. Patient's recent lumbar MRI exhibits solid L3-L4 posterior lumbar interbody fusion and previous left laminectomy noted at L4-L5.  There is mild to moderate canal stenosis and bilateral subarticular recess stenosis without definite impingement at L5-S1. Patient reports history of 2 lumbar surgeries 20+ years ago. Patient has had multiple lumbar injections performed in our office over the years with some relief of pain, most recent bilateral S1 transforaminal epidural steroid injection did not provide pain relief. Patient states severe pain is negatively impacting her life and makes it difficult to complete daily tasks. Patient denies focal weakness, numbness and tingling. Patient denies recent trauma or falls.   Patients course is complicated by diabetes mellitus, CHF, end stage renal disease. Also reports she was hospitalized this past April for ST elevation MI. Patient did undergo CABG with stent placement.     Review of Systems  Musculoskeletal:  Positive for back pain.  Neurological:  Negative for tingling, sensory change, focal weakness and weakness.  All other systems reviewed and are negative.  Otherwise per HPI.  Assessment & Plan: Visit Diagnoses:    ICD-10-CM   1. Lumbar radiculopathy  M54.16     2. Intervertebral disc disorders with radiculopathy, lumbar region  M51.16     3. S/P lumbar fusion  Z98.1     4. Post laminectomy syndrome  M96.1        Plan: Findings:  Chronic, worsening and severe bilateral lower back pain radiating to buttocks and down posterior thighs to knees, left greater than right. Patient continues to have severe pain despite good conservative therapies such as home exercise regimen, rest and use of over the counter medications. Patients clinical presentation and exam are consistent with S1 nerve pattern. No relief of pain with recent bilateral S1 transforaminal epidural steroid injection, minimal relief of pain with other prior injections performed in our office. At this point, I did not believe repeating lumbar epidural steroid injection would be beneficial. I did speak with her about medication management, however she was unable to tolerate gabapentin in the past. We did discuss spinal cord stimulator trial, I do believe patient is a good candidate for this procedure as she has history of lumbar surgery, chronic pain and has failed conservative therapies. I did discuss trial procedure and permanent implant with her today and also provided patient with educational literature to take home and review. If patient would like to move forward with trial I am happy to arrange  for Tillie Rung with [employer] to speak with patient. Patient voiced that she would like some time to think about next steps. I encouraged her to contact us with questions/concerns. No red flag symptoms noted upon exam today.     Meds & Orders: No orders of the defined types were placed in this  encounter.  No orders of the defined types were placed in this encounter.   Follow-up: Return if symptoms worsen or fail to improve.   Procedures: No procedures performed      Clinical History: EXAM: MRI LUMBAR SPINE WITHOUT CONTRAST   TECHNIQUE: Multiplanar, multisequence MR imaging of the lumbar spine was performed. No intravenous contrast was administered.   COMPARISON:  09/27/2017.   FINDINGS: Segmentation:  Standard.   Alignment:  No substantial sagittal subluxation   Vertebrae: No specific evidence of fracture or discitis/osteomyelitis. L3-L4 PLIF. No evidence of abnormal enhancement.   Conus medullaris and cauda equina: Conus extends to the T12 level. Conus appears normal. No evidence of abnormal enhancement of the conus or cauda equina.   Paraspinal and other soft tissues: Bilateral renal cysts.   Disc levels:   Motion limited assessment.   T12-L1: No significant disc protrusion, foraminal stenosis, or canal stenosis.   L1-L2: No significant disc protrusion, foraminal stenosis, or canal stenosis.   L2-L3: Disc bulge, facet arthropathy, and ligamentum flavum thickening. Resulting mild canal stenosis, similar. Mild right foraminal stenosis.   L3-L4: Previous PLIF with solid fusion across the disc space. Patent canal and foramina.   L4-L5: Previous left laminectomy endplate spurring and mild disc bulging. Similar mild bilateral subarticular recess narrowing without significant canal stenosis. Evaluation of foramina is limited by artifact without evidence of compressive foraminal stenosis.   L5-S1: Posterior disc bulge and endplate spurring. Bilateral facet arthropathy. Similar mild to moderate canal stenosis and bilateral subarticular recess stenosis without definite impingement. Similar probably mild bilateral foraminal stenosis without definite impingement.   IMPRESSION: 1. Motion limited study with similar multilevel degenerative change, as  detailed above. 2. Solid L3-L4 PLIF with patent canal and foramina at this level.     Electronically Signed   By: Margaretha Sheffield M.D.   On: 04/24/2021 08:35   She reports that she has never smoked. She has never used smokeless tobacco.  Recent Labs    09/30/20 0117 05/11/21 2339  HGBA1C 5.8* 6.7*    Objective:  VS:  HT:    WT:   BMI:     BP:123/71  HR: (!) 58bpm  TEMP: ( )  RESP:  Physical Exam Vitals and nursing note reviewed.  HENT:     Head: Normocephalic and atraumatic.     Right Ear: External ear normal.     Left Ear: External ear normal.     Nose: Nose normal.     Mouth/Throat:     Mouth: Mucous membranes are moist.  Eyes:     Extraocular Movements: Extraocular movements intact.  Cardiovascular:     Rate and Rhythm: Normal rate.     Pulses: Normal pulses.  Pulmonary:     Effort: Pulmonary effort is normal.  Abdominal:     General: Abdomen is flat. There is no distension.  Musculoskeletal:        General: Tenderness present.     Cervical back: Normal range of motion.     Comments: Pt rises from seated position to standing without difficulty. Good lumbar range of motion. Strong distal strength without clonus, no pain upon palpation of greater trochanters. Dysesthesias noted  to bilateral S1 dermatomes. Sensation intact bilaterally. Walks independently, gait steady.   Skin:    General: Skin is warm and dry.     Capillary Refill: Capillary refill takes less than 2 seconds.  Neurological:     General: No focal deficit present.     Mental Status: She is alert and oriented to person, place, and time.  Psychiatric:        Mood and Affect: Mood normal.     Ortho Exam  Imaging: No results found.  Past Medical/Family/Surgical/Social History: Medications & Allergies reviewed per EMR, new medications updated. Patient Active Problem List   Diagnosis Date Noted   STEMI (ST elevation myocardial infarction) ([place]) 05/12/2021   Volume overload 09/30/2020    Symptomatic anemia 09/29/2020   ESRD (end stage renal disease) ([place]) 06/04/2020   Acute on chronic diastolic (congestive) heart failure ([place]) 04/26/2020   Type 2 diabetes mellitus with stage 5 chronic kidney disease ([place]) 04/26/2020   Acute CHF (congestive heart failure) ([place]) 02/01/2020   S/P left TKA 07/01/2019   Status post total left knee replacement 07/01/2019   Hyperlipidemia 09/12/2016   Type 2 diabetes mellitus without complications ([place]) 57/84/6962   Tachycardia 08/26/2016   Palpitation 08/26/2016   SOB (shortness of breath) 08/26/2016   CKD (chronic kidney disease), stage V ([facility]) 04/28/2014   Chronic diastolic heart failure ([place]) 05/05/2013   Essential hypertension 05/05/2013   HEART MURMUR, SYSTOLIC 95/28/4132   Hypothyroidism 02/12/2007   OBESITY 01/08/2007   Obstructive sleep apnea 01/08/2007   Seasonal and perennial allergic rhinitis 01/08/2007   Past Medical History:  Diagnosis Date   ALLERGIC RHINITIS    Arthritis    CHF (congestive heart failure) ([facility])    CKD (chronic kidney disease)    COVID 01/21/2021   + HOME TEST  HAD COUGH CONGESTION AND MALAISE, OCC CONGESTION NOW   Heart murmur    at birth   Hyperlipidemia    Hypertension    Hypothyroid    Iron deficiency anemia    Obesity    OSA on CPAP    Refusal of blood transfusions as patient is Jehovah's Witness    Shingles 11/2020   LEFT BACK   Type 2 dm with insulin use    checks abg qday runs 125   Wears glasses    Family History  Problem Relation Age of Onset   Prostate cancer Father    Congestive Heart Failure Mother    Diabetes Sister    Other Sister        septis   Breast cancer Neg Hx    Past Surgical History:  Procedure Laterality Date   BREAST BIOPSY     left breast per pt; benign 20+ yrs ago   [place]   COLONOSCOPY WITH PROPOFOL N/A 10/24/2020   Procedure: COLONOSCOPY WITH PROPOFOL;  Surgeon: Ronnette Juniper, MD;  Location: [facility];  Service: Gastroenterology;   Laterality: N/A;   CORONARY STENT INTERVENTION N/A 05/12/2021   Procedure: CORONARY STENT INTERVENTION;  Surgeon: Early Osmond, MD;  Location: [facility];  Service: Cardiovascular;  Laterality: N/A;   CORONARY/GRAFT ACUTE MI REVASCULARIZATION N/A 05/12/2021   Procedure: Coronary/Graft Acute MI Revascularization;  Surgeon: Early Osmond, MD;  Location: [facility];  Service: Cardiovascular;  Laterality: N/A;   KNEE SURGERY Bilateral    x 2   LEFT HEART CATH AND CORONARY ANGIOGRAPHY N/A 05/12/2021   Procedure: LEFT HEART CATH AND CORONARY ANGIOGRAPHY;  Surgeon: Ali Lowe,  Arlie Solomons, MD;  Location: [facility];  Service: Cardiovascular;  Laterality: N/A;   LUMBAR DISC SURGERY     x 2   POLYPECTOMY  10/24/2020   Procedure: POLYPECTOMY;  Surgeon: Ronnette Juniper, MD;  Location: [facility];  Service: Gastroenterology;;   REDUCTION MAMMAPLASTY Bilateral 1985   TOE SURGERY     yrs ago per pt on 02-02-2021   TOTAL ABDOMINAL HYSTERECTOMY  1997   partial hysterectomy- still has ovaries   TOTAL KNEE ARTHROPLASTY Left 07/01/2019   Procedure: TOTAL KNEE ARTHROPLASTY;  Surgeon: Paralee Cancel, MD;  Location: [facility];  Service: Orthopedics;  Laterality: Left;  70 min NO BLOOD PRODUCTS!!   TREATMENT FISTULA ANAL     yrs ago per pt 0n 02-02-2021   TRIGGER FINGER RELEASE Left 02/08/2021   Procedure: Left Ring trigger finger release, left ring finger volar ganglion cyst excision;  Surgeon: Orene Desanctis, MD;  Location: [facility];  Service: Orthopedics;  Laterality: Left;  with local anesthesia   Social History   Occupational History   Occupation: school bus driver  Tobacco Use   Smoking status: Never   Smokeless tobacco: Never  Vaping Use   Vaping Use: Never used  Substance and Sexual Activity   Alcohol use: Not Currently   Drug use: No   Sexual activity: Not Currently    Birth control/protection: Surgical

## 2021-07-17 NOTE — Progress Notes (Unsigned)
Pt state middle and lower back pain that travels down her left leg. Pt state walking, standing and laying down makes the pain worse. Pt state she takes over the counter pain meds to help ease her pain. Pt has hx of inj on 06/20/21 pt state it didn't help  Numeric Pain Rating Scale and Functional Assessment Average Pain 10 Pain Right Now 9 My pain is intermittent, sharp, burning, and tingling Pain is worse with: walking, bending, sitting, standing, some activites, and laying down Pain improves with: medication and injections   In the last MONTH (on 0-10 scale) has pain interfered with the following?  1. General activity like being  able to carry out your everyday physical activities such as walking, climbing stairs, carrying groceries, or moving a chair?  Rating(5)  2. Relation with others like being able to carry out your usual social activities and roles such as  activities at home, at work and in your community. Rating(6)  3. Enjoyment of life such that you have  been bothered by emotional problems such as feeling anxious, depressed or irritable?  Rating(7)

## 2021-07-18 LAB — PTH, INTACT AND CALCIUM
Calcium, Total (PTH): 9.9 mg/dL (ref 8.7–10.3)
PTH: 175 pg/mL — ABNORMAL HIGH (ref 15–65)

## 2021-08-10 ENCOUNTER — Telehealth: Payer: Self-pay | Admitting: Physical Medicine and Rehabilitation

## 2021-08-10 NOTE — Telephone Encounter (Signed)
Pt is calling ready to go ahead

## 2021-08-13 ENCOUNTER — Other Ambulatory Visit: Payer: Self-pay | Admitting: Physical Medicine and Rehabilitation

## 2021-08-13 DIAGNOSIS — M5116 Intervertebral disc disorders with radiculopathy, lumbar region: Secondary | ICD-10-CM

## 2021-08-13 DIAGNOSIS — M5416 Radiculopathy, lumbar region: Secondary | ICD-10-CM

## 2021-08-13 DIAGNOSIS — Z981 Arthrodesis status: Secondary | ICD-10-CM

## 2021-08-14 ENCOUNTER — Ambulatory Visit (HOSPITAL_COMMUNITY)
Admission: RE | Admit: 2021-08-14 | Discharge: 2021-08-14 | Disposition: A | Discharge status: Home / Self Care | Payer: Medicare Other | Source: Ambulatory Visit | Attending: Internal Medicine | Admitting: Internal Medicine

## 2021-08-14 ENCOUNTER — Other Ambulatory Visit: Payer: Self-pay | Admitting: Cardiology

## 2021-08-14 VITALS — BP 133/65 | HR 52 | Temp 98.5°F | Resp 12

## 2021-08-14 DIAGNOSIS — N185 Chronic kidney disease, stage 5: Secondary | ICD-10-CM | POA: Insufficient documentation

## 2021-08-14 LAB — IRON AND TIBC
Iron: 98 ug/dL (ref 28–170)
Saturation Ratios: 39 % — ABNORMAL HIGH (ref 10.4–31.8)
TIBC: 252 ug/dL (ref 250–450)
UIBC: 154 ug/dL

## 2021-08-14 LAB — RENAL FUNCTION PANEL
Albumin: 3.6 g/dL (ref 3.5–5.0)
Anion gap: 11 (ref 5–15)
BUN: 100 mg/dL — ABNORMAL HIGH (ref 8–23)
CO2: 22 mmol/L (ref 22–32)
Calcium: 10.2 mg/dL (ref 8.9–10.3)
Chloride: 107 mmol/L (ref 98–111)
Creatinine, Ser: 5.9 mg/dL — ABNORMAL HIGH (ref 0.44–1.00)
GFR, Estimated: 7 mL/min — ABNORMAL LOW (ref >60)
Glucose, Bld: 168 mg/dL — ABNORMAL HIGH (ref 70–99)
Phosphorus: 5 mg/dL — ABNORMAL HIGH (ref 2.5–4.6)
Potassium: 4.2 mmol/L (ref 3.5–5.1)
Sodium: 140 mmol/L (ref 135–145)

## 2021-08-14 LAB — FERRITIN: Ferritin: 694 ng/mL — ABNORMAL HIGH (ref 11–307)

## 2021-08-14 LAB — POCT HEMOGLOBIN-HEMACUE: Hemoglobin: 8.9 g/dL — ABNORMAL LOW (ref 12.0–15.0)

## 2021-08-14 MED ORDER — EPOETIN ALFA 40000 UNIT/ML IJ SOLN
INTRAMUSCULAR | Status: AC
  Filled 2021-08-14: qty 2

## 2021-08-14 MED ORDER — EPOETIN ALFA-EPBX 40000 UNIT/ML IJ SOLN: 60000.0000 [IU] | INTRAMUSCULAR | Status: DC

## 2021-08-14 MED ORDER — EPOETIN ALFA 40000 UNIT/ML IJ SOLN
60000.0000 [IU] | Freq: Once | INTRAMUSCULAR | Status: AC
  Administered 2021-08-14: 60000 [IU] via SUBCUTANEOUS

## 2021-08-15 LAB — PTH, INTACT AND CALCIUM
Calcium, Total (PTH): 10 mg/dL (ref 8.7–10.3)
PTH: 154 pg/mL — ABNORMAL HIGH (ref 15–65)

## 2021-08-20 ENCOUNTER — Other Ambulatory Visit: Payer: Self-pay | Admitting: Internal Medicine

## 2021-08-20 DIAGNOSIS — Z1231 Encounter for screening mammogram for malignant neoplasm of breast: Secondary | ICD-10-CM

## 2021-09-04 ENCOUNTER — Ambulatory Visit
Admission: RE | Admit: 2021-09-04 | Discharge: 2021-09-04 | Disposition: A | Discharge status: Home / Self Care | Payer: Medicare Other | Source: Ambulatory Visit | Attending: Internal Medicine | Admitting: Internal Medicine

## 2021-09-04 DIAGNOSIS — Z1231 Encounter for screening mammogram for malignant neoplasm of breast: Secondary | ICD-10-CM

## 2021-09-11 ENCOUNTER — Ambulatory Visit: Payer: Medicare Other | Admitting: Physical Medicine and Rehabilitation

## 2021-09-11 ENCOUNTER — Encounter: Payer: Self-pay | Admitting: Physical Medicine and Rehabilitation

## 2021-09-11 ENCOUNTER — Ambulatory Visit (HOSPITAL_COMMUNITY)
Admission: RE | Admit: 2021-09-11 | Discharge: 2021-09-11 | Disposition: A | Discharge status: Home / Self Care | Payer: Medicare Other | Source: Ambulatory Visit | Attending: Internal Medicine | Admitting: Internal Medicine

## 2021-09-11 VITALS — BP 142/64 | HR 54 | Temp 97.8°F | Resp 20

## 2021-09-11 VITALS — BP 154/74 | HR 56

## 2021-09-11 DIAGNOSIS — M5416 Radiculopathy, lumbar region: Secondary | ICD-10-CM

## 2021-09-11 DIAGNOSIS — M47816 Spondylosis without myelopathy or radiculopathy, lumbar region: Secondary | ICD-10-CM

## 2021-09-11 DIAGNOSIS — M4726 Other spondylosis with radiculopathy, lumbar region: Secondary | ICD-10-CM

## 2021-09-11 DIAGNOSIS — M961 Postlaminectomy syndrome, not elsewhere classified: Secondary | ICD-10-CM

## 2021-09-11 DIAGNOSIS — N185 Chronic kidney disease, stage 5: Secondary | ICD-10-CM | POA: Insufficient documentation

## 2021-09-11 LAB — RENAL FUNCTION PANEL
Albumin: 3.5 g/dL (ref 3.5–5.0)
Anion gap: 11 (ref 5–15)
BUN: 102 mg/dL — ABNORMAL HIGH (ref 8–23)
CO2: 22 mmol/L (ref 22–32)
Calcium: 10.2 mg/dL (ref 8.9–10.3)
Chloride: 110 mmol/L (ref 98–111)
Creatinine, Ser: 5.27 mg/dL — ABNORMAL HIGH (ref 0.44–1.00)
GFR, Estimated: 8 mL/min — ABNORMAL LOW (ref >60)
Glucose, Bld: 142 mg/dL — ABNORMAL HIGH (ref 70–99)
Phosphorus: 4.6 mg/dL (ref 2.5–4.6)
Potassium: 4 mmol/L (ref 3.5–5.1)
Sodium: 143 mmol/L (ref 135–145)

## 2021-09-11 LAB — POCT HEMOGLOBIN-HEMACUE: Hemoglobin: 8.8 g/dL — ABNORMAL LOW (ref 12.0–15.0)

## 2021-09-11 LAB — IRON AND TIBC
Iron: 97 ug/dL (ref 28–170)
Saturation Ratios: 39 % — ABNORMAL HIGH (ref 10.4–31.8)
TIBC: 248 ug/dL — ABNORMAL LOW (ref 250–450)
UIBC: 151 ug/dL

## 2021-09-11 LAB — FERRITIN: Ferritin: 541 ng/mL — ABNORMAL HIGH (ref 11–307)

## 2021-09-11 MED ORDER — EPOETIN ALFA-EPBX 40000 UNIT/ML IJ SOLN
INTRAMUSCULAR | Status: AC
  Administered 2021-09-11: 60000 [IU] via SUBCUTANEOUS
  Filled 2021-09-11: qty 2

## 2021-09-11 MED ORDER — EPOETIN ALFA-EPBX 40000 UNIT/ML IJ SOLN: 60000.0000 [IU] | INTRAMUSCULAR | Status: DC

## 2021-09-11 MED ORDER — PREGABALIN 50 MG PO CAPS: ORAL_CAPSULE | ORAL | 0 refills | Status: DC

## 2021-09-11 NOTE — Progress Notes (Signed)
Pt state lower back pain.

## 2021-09-11 NOTE — Progress Notes (Signed)
Tiffany [name] - 71 y.o. female MRN [id number]  Date of birth: [birth year]  Office Visit Note: Visit Date: 09/11/2021 PCP: Nolene Ebbs, MD Referred by: Nolene Ebbs, MD  Subjective: Chief Complaint  Patient presents with   Lower Back - Pain   HPI: Tiffany [name] is a 71 y.o. female who comes in today for evaluation of chronic, worsening and severe bilateral lower back pain radiating to buttocks and down posterior thighs to knees. Pain ongoing for several years. Patient states her pain is exacerbated by standing, walking and activity, currently rates pain as 7 out of 10. Patient reports some relief of pain with home exercise regimen, rest and use of OTC medications as needed. Patient states she has not been able to attend formal physical therapy due to severe pain. Patient does have history of chronic pain management with Dr. Niel Hummer. Patient's recent lumbar MRI exhibits solid L3-L4 posterior lumbar interbody fusion and previous left laminectomy noted at L4-L5.  There is mild to moderate canal stenosis and bilateral subarticular recess stenosis without definite impingement. No high grade spinal canal stenosis noted. Patient reports history of 2 lumbar surgeries 20+ years ago. Patient has history of multiple lumbar steroid injections performed in our office with minimal relief of pain. We previously discussed spinal cord stimulator trial with patient and brought her in today to discuss procedure and answer any questions she may have. Patient denies focal weakness, numbness and tingling. Patient denies recent trauma or falls. Patient currently ambulating with cane.  Patients course is complicated by diabetes mellitus, CHF and end stage renal disease. Patient currently being managed by Dr. Salvatore Decent at [facility], states she has recently discussed peritoneal dialysis. Patient hospitalized this past April for ST elevation MI. Patient did undergo CABG with stent placement,  currently taking Plavix.     Review of Systems  Musculoskeletal:  Positive for back pain.  Neurological:  Negative for tingling, sensory change, focal weakness and weakness.  All other systems reviewed and are negative.  Otherwise per HPI.  Assessment & Plan: Visit Diagnoses:    ICD-10-CM   1. Lumbar radiculopathy  M54.16     2. Other spondylosis with radiculopathy, lumbar region  M47.26     3. Facet arthropathy, lumbar  M47.816     4. Post laminectomy syndrome  M96.1        Plan: Findings:  Chronic, worsening and severe bilateral lower back pain radiating to buttocks and down posterior thighs to knees. Patient continues to have severe pain despite good conservative therapies such as home exercise regimen, rest and use of medications. Patients clinical presentation and exam are consistent with S1 nerve pattern. We discussed treatment plan in detail today including spinal cord stimulator trial. At this time, patient is actively taking Plavix after cardiac stent placement in April, we would not be able to stop Plavix for procedure due to increased risk of blood clots/cardiac issues. We do feel patient would be a good candidate for spinal cord stimulator in the future as she has history of lumbar surgery, chronic pain and has failed conservative therapies. I did inform patient we would revisit possibility of spinal cord stimulator trial next year after completing therapy with Plavix. I also discussed medication management with patient, recent labs do show elevated kidney function and decreased GFR. We are limited in terms of medications, however I did prescribe low dose of Lyrica today for patient to take at bedtime. I would like to see her back in 4  weeks for follow up. No red flag symptoms noted upon exam today.     Meds & Orders:  Meds ordered this encounter  Medications   pregabalin (LYRICA) 50 MG capsule    Sig: Take 1 tablet 50 mg by mouth at bedtime.    Dispense:  30 capsule     Refill:  0   No orders of the defined types were placed in this encounter.   Follow-up: Return for 4 week follow up for re-evaluation.   Procedures: No procedures performed      Clinical History: EXAM: MRI LUMBAR SPINE WITHOUT CONTRAST   TECHNIQUE: Multiplanar, multisequence MR imaging of the lumbar spine was performed. No intravenous contrast was administered.   COMPARISON:  09/27/2017.   FINDINGS: Segmentation:  Standard.   Alignment:  No substantial sagittal subluxation   Vertebrae: No specific evidence of fracture or discitis/osteomyelitis. L3-L4 PLIF. No evidence of abnormal enhancement.   Conus medullaris and cauda equina: Conus extends to the T12 level. Conus appears normal. No evidence of abnormal enhancement of the conus or cauda equina.   Paraspinal and other soft tissues: Bilateral renal cysts.   Disc levels:   Motion limited assessment.   T12-L1: No significant disc protrusion, foraminal stenosis, or canal stenosis.   L1-L2: No significant disc protrusion, foraminal stenosis, or canal stenosis.   L2-L3: Disc bulge, facet arthropathy, and ligamentum flavum thickening. Resulting mild canal stenosis, similar. Mild right foraminal stenosis.   L3-L4: Previous PLIF with solid fusion across the disc space. Patent canal and foramina.   L4-L5: Previous left laminectomy endplate spurring and mild disc bulging. Similar mild bilateral subarticular recess narrowing without significant canal stenosis. Evaluation of foramina is limited by artifact without evidence of compressive foraminal stenosis.   L5-S1: Posterior disc bulge and endplate spurring. Bilateral facet arthropathy. Similar mild to moderate canal stenosis and bilateral subarticular recess stenosis without definite impingement. Similar probably mild bilateral foraminal stenosis without definite impingement.   IMPRESSION: 1. Motion limited study with similar multilevel degenerative change, as  detailed above. 2. Solid L3-L4 PLIF with patent canal and foramina at this level.     Electronically Signed   By: Margaretha Sheffield M.D.   On: 04/24/2021 08:35   She reports that she has never smoked. She has never used smokeless tobacco.  Recent Labs    09/30/20 0117 05/11/21 2339  HGBA1C 5.8* 6.7*    Objective:  VS:  HT:    WT:   BMI:     BP: (!) 154/74  HR: (!) 56bpm  TEMP: ( )  RESP:  Physical Exam Vitals and nursing note reviewed.  HENT:     Head: Normocephalic and atraumatic.     Right Ear: External ear normal.     Left Ear: External ear normal.     Nose: Nose normal.     Mouth/Throat:     Mouth: Mucous membranes are moist.  Eyes:     Extraocular Movements: Extraocular movements intact.  Cardiovascular:     Rate and Rhythm: Normal rate.     Pulses: Normal pulses.  Pulmonary:     Effort: Pulmonary effort is normal.  Abdominal:     General: Abdomen is flat. There is no distension.  Musculoskeletal:        General: Tenderness present.     Cervical back: Normal range of motion.     Comments: Pt rises from seated position to standing without difficulty. Good lumbar range of motion. Strong distal strength without clonus, no pain upon  palpation of greater trochanters. Dysesthesias noted to bilateral S1 dermatomes. Sensation intact bilaterally. Walks independently, gait steady.    Skin:    General: Skin is warm and dry.     Capillary Refill: Capillary refill takes less than 2 seconds.  Neurological:     Mental Status: She is alert and oriented to person, place, and time.     Gait: Gait abnormal.  Psychiatric:        Mood and Affect: Mood normal.        Behavior: Behavior normal.     Ortho Exam  Imaging: No results found.  Past Medical/Family/Surgical/Social History: Medications & Allergies reviewed per EMR, new medications updated. Patient Active Problem List   Diagnosis Date Noted   STEMI (ST elevation myocardial infarction) ([place]) 05/12/2021   Volume  overload 09/30/2020   Symptomatic anemia 09/29/2020   ESRD (end stage renal disease) ([place]) 06/04/2020   Acute on chronic diastolic (congestive) heart failure ([place]) 04/26/2020   Type 2 diabetes mellitus with stage 5 chronic kidney disease ([place]) 04/26/2020   Acute CHF (congestive heart failure) ([place]) 02/01/2020   S/P left TKA 07/01/2019   Status post total left knee replacement 07/01/2019   Hyperlipidemia 09/12/2016   Type 2 diabetes mellitus without complications ([place]) 08/67/6195   Tachycardia 08/26/2016   Palpitation 08/26/2016   SOB (shortness of breath) 08/26/2016   CKD (chronic kidney disease), stage V (H[facility]) 04/28/2014   Chronic diastolic heart failure ([place]) 05/05/2013   Essential hypertension 05/05/2013   HEART MURMUR, SYSTOLIC 09/32/6712   Hypothyroidism 02/12/2007   OBESITY 01/08/2007   Obstructive sleep apnea 01/08/2007   Seasonal and perennial allergic rhinitis 01/08/2007   Past Medical History:  Diagnosis Date   ALLERGIC RHINITIS    Arthritis    CHF (congestive heart failure) (HCC)    CKD (chronic kidney disease)    COVID 01/21/2021   + HOME TEST  HAD COUGH CONGESTION AND MALAISE, OCC CONGESTION NOW   Heart murmur    at birth   Hyperlipidemia    Hypertension    Hypothyroid    Iron deficiency anemia    Obesity    OSA on CPAP    Refusal of blood transfusions as patient is Jehovah's Witness    Shingles 11/2020   LEFT BACK   Type 2 dm with insulin use    checks abg qday runs 125   Wears glasses    Family History  Problem Relation Age of Onset   Prostate cancer Father    Congestive Heart Failure Mother    Diabetes Sister    Other Sister        septis   Breast cancer Neg Hx    Past Surgical History:  Procedure Laterality Date   BREAST BIOPSY     left breast per pt; benign 20+ yrs ago   [place]   COLONOSCOPY WITH PROPOFOL N/A 10/24/2020   Procedure: COLONOSCOPY WITH PROPOFOL;  Surgeon: Ronnette Juniper, MD;  Location: [facility];   Service: Gastroenterology;  Laterality: N/A;   CORONARY STENT INTERVENTION N/A 05/12/2021   Procedure: CORONARY STENT INTERVENTION;  Surgeon: Early Osmond, MD;  Location: [place] [facility];  Service: Cardiovascular;  Laterality: N/A;   CORONARY/GRAFT ACUTE MI REVASCULARIZATION N/A 05/12/2021   Procedure: Coronary/Graft Acute MI Revascularization;  Surgeon: Early Osmond, MD;  Location: [place] [facility];  Service: Cardiovascular;  Laterality: N/A;   KNEE SURGERY Bilateral    x 2   LEFT HEART CATH AND CORONARY  ANGIOGRAPHY N/A 05/12/2021   Procedure: LEFT HEART CATH AND CORONARY ANGIOGRAPHY;  Surgeon: Early Osmond, MD;  Location: [place] [facility];  Service: Cardiovascular;  Laterality: N/A;   LUMBAR DISC SURGERY     x 2   POLYPECTOMY  10/24/2020   Procedure: POLYPECTOMY;  Surgeon: Ronnette Juniper, MD;  Location: [facility];  Service: Gastroenterology;;   REDUCTION MAMMAPLASTY Bilateral 1985   TOE SURGERY     yrs ago per pt on 02-02-2021   TOTAL ABDOMINAL HYSTERECTOMY  1997   partial hysterectomy- still has ovaries   TOTAL KNEE ARTHROPLASTY Left 07/01/2019   Procedure: TOTAL KNEE ARTHROPLASTY;  Surgeon: Paralee Cancel, MD;  Location: [facility];  Service: Orthopedics;  Laterality: Left;  70 min NO BLOOD PRODUCTS!!   TREATMENT FISTULA ANAL     yrs ago per pt 0n 02-02-2021   TRIGGER FINGER RELEASE Left 02/08/2021   Procedure: Left Ring trigger finger release, left ring finger volar ganglion cyst excision;  Surgeon: Orene Desanctis, MD;  Location: [facility];  Service: Orthopedics;  Laterality: Left;  with local anesthesia   Social History   Occupational History   Occupation: school bus driver  Tobacco Use   Smoking status: Never   Smokeless tobacco: Never  Vaping Use   Vaping Use: Never used  Substance and Sexual Activity   Alcohol use: Not Currently   Drug use: No   Sexual activity: Not Currently    Birth control/protection: Surgical

## 2021-09-12 LAB — PTH, INTACT AND CALCIUM
Calcium, Total (PTH): 10 mg/dL (ref 8.7–10.3)
PTH: 110 pg/mL — ABNORMAL HIGH (ref 15–65)

## 2021-10-05 ENCOUNTER — Telehealth: Payer: Self-pay | Admitting: Physical Medicine and Rehabilitation

## 2021-10-05 NOTE — Telephone Encounter (Signed)
Patient called. Would like to cancel her appointment with [facility].

## 2021-10-09 ENCOUNTER — Encounter (HOSPITAL_COMMUNITY)
Admission: RE | Admit: 2021-10-09 | Discharge: 2021-10-09 | Disposition: A | Discharge status: Home / Self Care | Payer: Medicare Other | Source: Ambulatory Visit | Attending: Internal Medicine | Admitting: Internal Medicine

## 2021-10-09 ENCOUNTER — Telehealth: Payer: Self-pay | Admitting: Physical Medicine and Rehabilitation

## 2021-10-09 ENCOUNTER — Ambulatory Visit: Payer: Medicare Other | Admitting: Physical Medicine and Rehabilitation

## 2021-10-09 VITALS — BP 136/65 | HR 54 | Temp 97.2°F | Resp 17

## 2021-10-09 DIAGNOSIS — N1831 Chronic kidney disease, stage 3a: Secondary | ICD-10-CM | POA: Diagnosis present

## 2021-10-09 DIAGNOSIS — G4733 Obstructive sleep apnea (adult) (pediatric): Secondary | ICD-10-CM | POA: Insufficient documentation

## 2021-10-09 DIAGNOSIS — I10 Essential (primary) hypertension: Secondary | ICD-10-CM | POA: Insufficient documentation

## 2021-10-09 DIAGNOSIS — I5032 Chronic diastolic (congestive) heart failure: Secondary | ICD-10-CM | POA: Diagnosis present

## 2021-10-09 DIAGNOSIS — N185 Chronic kidney disease, stage 5: Secondary | ICD-10-CM | POA: Diagnosis not present

## 2021-10-09 DIAGNOSIS — I2510 Atherosclerotic heart disease of native coronary artery without angina pectoris: Secondary | ICD-10-CM | POA: Diagnosis present

## 2021-10-09 DIAGNOSIS — E785 Hyperlipidemia, unspecified: Secondary | ICD-10-CM | POA: Insufficient documentation

## 2021-10-09 LAB — IRON AND TIBC
Iron: 86 ug/dL (ref 28–170)
Saturation Ratios: 34 % — ABNORMAL HIGH (ref 10.4–31.8)
TIBC: 256 ug/dL (ref 250–450)
UIBC: 170 ug/dL

## 2021-10-09 LAB — RENAL FUNCTION PANEL
Albumin: 3.5 g/dL (ref 3.5–5.0)
Anion gap: 14 (ref 5–15)
BUN: 128 mg/dL — ABNORMAL HIGH (ref 8–23)
CO2: 19 mmol/L — ABNORMAL LOW (ref 22–32)
Calcium: 10 mg/dL (ref 8.9–10.3)
Chloride: 109 mmol/L (ref 98–111)
Creatinine, Ser: 5.99 mg/dL — ABNORMAL HIGH (ref 0.44–1.00)
GFR, Estimated: 7 mL/min — ABNORMAL LOW (ref >60)
Glucose, Bld: 156 mg/dL — ABNORMAL HIGH (ref 70–99)
Phosphorus: 6.2 mg/dL — ABNORMAL HIGH (ref 2.5–4.6)
Potassium: 3.9 mmol/L (ref 3.5–5.1)
Sodium: 142 mmol/L (ref 135–145)

## 2021-10-09 LAB — FERRITIN: Ferritin: 515 ng/mL — ABNORMAL HIGH (ref 11–307)

## 2021-10-09 MED ORDER — EPOETIN ALFA-EPBX 40000 UNIT/ML IJ SOLN
INTRAMUSCULAR | Status: AC
  Filled 2021-10-09: qty 2

## 2021-10-09 MED ORDER — EPOETIN ALFA-EPBX 40000 UNIT/ML IJ SOLN
60000.0000 [IU] | INTRAMUSCULAR | Status: DC
  Administered 2021-10-09: 60000 [IU] via SUBCUTANEOUS

## 2021-10-09 NOTE — Telephone Encounter (Signed)
Spoke with patient this morning regarding possible side effects associated with Lyrica. States recent dizziness and restlessness. Also states sleeping all day and shortness of breath. States she is taking 50 mg Lyrica every other day. Patient instructed to stop Lyrica and follow up with PCP if symptoms persist.

## 2021-10-10 LAB — PTH, INTACT AND CALCIUM
Calcium, Total (PTH): 10.1 mg/dL (ref 8.7–10.3)
PTH: 149 pg/mL — ABNORMAL HIGH (ref 15–65)

## 2021-10-14 NOTE — Progress Notes (Unsigned)
Cardiology Office Note:    Date:  10/15/2021   ID:  Tiffany [name], DOB 08-18-1950, MRN [id number]  PCP:  Nolene Ebbs, MD  Cardiologist:  Sinclair Grooms, MD   Referring MD: Nolene Ebbs, MD   Chief Complaint  Patient presents with   Shortness of Breath   Hypertension   Congestive Heart Failure    History of Present Illness:    Tiffany [name] is a 71 y.o. female with a hx of  essential hypertension, obesity, OSA, chronic diastolic HF, end stage renal disease having chosen against dialysis during a time of nausea and vomiting, kidney function miraculously improved with creatinine going from nearly 10 down to under 4, and STEMI 05/11/2021 treated with PDA DES.   No chest discomfort or recurrent symptoms similar to her myocardial infarction since April.  She continues on Plavix and aspirin.  Complaint today is that she is unable to walk very far without being short of breath and she also feels short of breath when she lies down.  She feels it is because of Lyrica which she discontinued 1 week ago.  Discontinuing the medicine did not help her breathing.  Past Medical History:  Diagnosis Date   ALLERGIC RHINITIS    Arthritis    CHF (congestive heart failure) (HCC)    CKD (chronic kidney disease)    COVID 01/21/2021   + HOME TEST  HAD COUGH CONGESTION AND MALAISE, OCC CONGESTION NOW   Heart murmur    at birth   Hyperlipidemia    Hypertension    Hypothyroid    Iron deficiency anemia    Obesity    OSA on CPAP    Refusal of blood transfusions as patient is Jehovah's Witness    Shingles 11/2020   LEFT BACK   Type 2 dm with insulin use    checks abg qday runs 125   Wears glasses     Past Surgical History:  Procedure Laterality Date   BREAST BIOPSY     left breast per pt; benign 20+ yrs ago   [place]   COLONOSCOPY WITH PROPOFOL N/A 10/24/2020   Procedure: COLONOSCOPY WITH PROPOFOL;  Surgeon: Ronnette Juniper, MD;  Location: [facility];   Service: Gastroenterology;  Laterality: N/A;   CORONARY STENT INTERVENTION N/A 05/12/2021   Procedure: CORONARY STENT INTERVENTION;  Surgeon: Early Osmond, MD;  Location: [place] [facility];  Service: Cardiovascular;  Laterality: N/A;   CORONARY/GRAFT ACUTE MI REVASCULARIZATION N/A 05/12/2021   Procedure: Coronary/Graft Acute MI Revascularization;  Surgeon: Early Osmond, MD;  Location: [place] [facility];  Service: Cardiovascular;  Laterality: N/A;   KNEE SURGERY Bilateral    x 2   LEFT HEART CATH AND CORONARY ANGIOGRAPHY N/A 05/12/2021   Procedure: LEFT HEART CATH AND CORONARY ANGIOGRAPHY;  Surgeon: Early Osmond, MD;  Location: [place] [facility];  Service: Cardiovascular;  Laterality: N/A;   LUMBAR DISC SURGERY     x 2   POLYPECTOMY  10/24/2020   Procedure: POLYPECTOMY;  Surgeon: Ronnette Juniper, MD;  [facility]: [facility];  Service: Gastroenterology;;   REDUCTION MAMMAPLASTY Bilateral 1985   TOE SURGERY     yrs ago per pt on 02-02-2021   TOTAL ABDOMINAL HYSTERECTOMY  1997   partial hysterectomy- still has ovaries   TOTAL KNEE ARTHROPLASTY Left 07/01/2019   Procedure: TOTAL KNEE ARTHROPLASTY;  Surgeon: Paralee Cancel, MD;  Location: [facility];  Service: Orthopedics;  Laterality: Left;  70 min NO BLOOD  PRODUCTS!!   TREATMENT FISTULA ANAL     yrs ago per pt 0n 02-02-2021   TRIGGER FINGER RELEASE Left 02/08/2021   Procedure: Left Ring trigger finger release, left ring finger volar ganglion cyst excision;  Surgeon: Orene Desanctis, MD;  Location: [facility];  Service: Orthopedics;  Laterality: Left;  with local anesthesia    Current Medications: Current Meds  Medication Sig   albuterol (VENTOLIN HFA) 108 (90 Base) MCG/ACT inhaler Inhale 2 puffs into the lungs every 6 (six) hours as needed for wheezing or shortness of breath.   amLODipine (NORVASC) 10 MG tablet Take 10 mg by mouth 2 (two) times daily.   aspirin EC 81 MG tablet Take 81 mg by mouth every evening. Swallow whole.    atorvastatin (LIPITOR) 80 MG tablet Take 1 tablet (80 mg total) by mouth at bedtime.   B Complex-C (B-COMPLEX WITH VITAMIN C) tablet Take 1 tablet by mouth in the morning.   budesonide-formoterol (SYMBICORT) 160-4.5 MCG/ACT inhaler Inhale 2 puffs then rinse mouth, twice daily (Patient taking differently: Inhale 2 puffs into the lungs 2 (two) times daily as needed (wheezing or shortness of breath).)   calcitRIOL (ROCALTROL) 0.5 MCG capsule Take 0.5 mcg by mouth in the morning.   carvedilol (COREG) 25 MG tablet Take 1 tablet (25 mg total) by mouth 2 (two) times daily with a meal.   cetirizine (ZYRTEC) 10 MG tablet Take 10 mg by mouth daily as needed for allergies.   cloNIDine (CATAPRES - DOSED IN MG/24 HR) 0.3 mg/24hr patch Place 0.3 mg onto the skin every Friday.   clopidogrel (PLAVIX) 75 MG tablet Take 1 tablet (75 mg total) by mouth daily.   fluticasone (FLONASE) 50 MCG/ACT nasal spray SMARTSIG:2 Spray(s) Both Nares Every Evening   hydrALAZINE (APRESOLINE) 50 MG tablet Take 50 mg by mouth 2 (two) times daily.   levothyroxine (SYNTHROID) 200 MCG tablet Take 200 mcg by mouth every morning.   Multiple Vitamins-Minerals (PRESERVISION AREDS 2 PO) Take 1 tablet by mouth in the morning.   nitroGLYCERIN (NITROSTAT) 0.4 MG SL tablet Place 1 tablet (0.4 mg total) under the tongue every 5 (five) minutes x 3 doses as needed for chest pain.   Omega-3 Fatty Acids (FISH OIL) 500 MG CAPS Take 500 mg by mouth at bedtime.   ondansetron (ZOFRAN) 4 MG tablet Take 1 tablet (4 mg total) by mouth every 6 (six) hours as needed for nausea or vomiting.   pregabalin (LYRICA) 50 MG capsule Take 1 tablet 50 mg by mouth at bedtime.   Propylene Glycol (SYSTANE COMPLETE) 0.6 % SOLN Place 1 drop into both eyes in the morning, at noon, in the evening, and at bedtime.   senna (SENOKOT) 8.6 MG TABS tablet Take 2 tablets (17.2 mg total) by mouth at bedtime.   torsemide (DEMADEX) 100 MG tablet Take 200 mg by mouth daily.    traZODone (DESYREL) 100 MG tablet Take 100 mg by mouth at bedtime.     Allergies:   Other and Sulfa antibiotics   Social History   Socioeconomic History   Marital status: Single    Spouse name: Not on file   Number of children: Not on file   Years of education: Not on file   Highest education level: Not on file  Occupational History   Occupation: school bus driver  Tobacco Use   Smoking status: Never   Smokeless tobacco: Never  Vaping Use   Vaping Use: Never used  Substance and Sexual Activity  Alcohol use: Not Currently   Drug use: No   Sexual activity: Not Currently    Birth control/protection: Surgical  Other Topics Concern   Not on file  Social History Narrative   Not on file   Social Determinants of Health   Financial Resource Strain: Not on file  Food Insecurity: Not on file  Transportation Needs: Not on file  Physical Activity: Not on file  Stress: Not on file  Social Connections: Not on file     Family History: The patient's family history includes Congestive Heart Failure in her mother; Diabetes in her sister; Other in her sister; Prostate cancer in her father. There is no history of Breast cancer.  ROS:   Please see the history of present illness.    She now says that she would approve of dialysis.  She would prefer peritoneal as opposed to hemodialysis.  She does not want to go into the dialysis centers.  I am not sure if she has had this conversation with Dr. Johnney Ou.  All other systems reviewed and are negative.  EKGs/Labs/Other Studies Reviewed:    The following studies were reviewed today: 2 D Doppler ECHOCARDIOGRAM 2023 Diagnostic Dominance: Right  Intervention   EKG:  EKG not repeated  Recent Labs: 05/12/2021: Magnesium 2.2 05/28/2021: ALT 14; Platelets 178; TSH 5.72 09/11/2021: Hemoglobin 8.8 10/09/2021: BUN 128; Creatinine, Ser 5.99; Potassium 3.9; Sodium 142  Recent Lipid Panel    Component Value Date/Time   CHOL 151 05/11/2021 2339    TRIG 51 05/11/2021 2339   HDL 45 05/11/2021 2339   CHOLHDL 3.4 05/11/2021 2339   VLDL 10 05/11/2021 2339   LDLCALC 96 05/11/2021 2339    Physical Exam:    VS:  BP 138/62   Pulse (!) 52   Ht '5\' 9"'$  (1.753 m)   Wt 254 lb (115.2 kg)   SpO2 95%   BMI 37.51 kg/m     Wt Readings from Last 3 Encounters:  10/15/21 254 lb (115.2 kg)  06/11/21 233 lb 9.6 oz (106 kg)  05/20/21 229 lb 15 oz (104.3 kg)     GEN: Overweight with increase in weight from 229 pounds to 254 pounds since her myocardial infarction in April.. No acute distress HEENT: Normal NECK: No JVD. LYMPHATICS: No lymphadenopathy CARDIAC: No murmur. RRR no gallop, or edema. VASCULAR:  Normal Pulses. No bruits. RESPIRATORY:  Clear to auscultation without rales, wheezing or rhonchi  ABDOMEN: Soft, non-tender, non-distended, No pulsatile mass, MUSCULOSKELETAL: No deformity  SKIN: Warm and dry NEUROLOGIC:  Alert and oriented x 3 PSYCHIATRIC:  Normal affect   ASSESSMENT:    1. Chronic diastolic heart failure ([place])   2. Coronary artery disease involving native coronary artery of native heart without angina pectoris   3. Essential hypertension   4. CKD (chronic kidney disease), stage V ([place])   5. Hyperlipidemia, unspecified hyperlipidemia type   6. Stage 3a chronic kidney disease ([place])   7. Obstructive sleep apnea    PLAN:    In order of problems listed above:  Worsening diastolic heart failure secondary to progressive kidney failure with volume overload.  I encouraged her to speak with Dr. Johnney Ou immediately to determine what the next course of action should be.  The shortness of breath is because the patient to feel that dialysis would be acceptable.  She is at a point now where she cannot really do very much at all.  She prefers peritoneal dialysis.  I explained that she may need a period  of hemodialysis until peritoneal can be appropriately started. Denies angina.  Continue Plavix and aspirin. Elevated due to volume  overload. Creatinine is nearly 6 and most recent BUN was 129. Continue fish oil.  Not currently on a statin. Stage V CKD.  Encouraged her to call Dr. Johnney Ou right away to arrange initiation of dialysis.    42-monthcardiology follow-up (Ali [facility]   Medication Adjustments/Labs and Tests Ordered: Current medicines are reviewed at length with the patient today.  Concerns regarding medicines are outlined above.  No orders of the defined types were placed in this encounter.  No orders of the defined types were placed in this encounter.   Patient Instructions  Medication Instructions:  Your physician recommends that you continue on your current medications as directed. Please refer to the Current Medication list given to you today.  *If you need a refill on your cardiac medications before your next appointment, please call your pharmacy*  Lab Work: NONE  Testing/Procedures: NONE  Follow-Up: [facility] you and your health needs are our priority.  As part of our continuing mission to provide you with exceptional heart care, we have created designated Provider Care Teams.  These Care Teams include your primary Cardiologist (physician) and Advanced Practice Providers (APPs -  Physician Assistants and Nurse Practitioners) who all work together to provide you with the care you need, when you need it.  Your next appointment:   6 month(s)  The format for your next appointment:   In Person  Provider:   ALenna Sciara MD  Other Instructions Dr. STamala Julianstrongly recommends you call your nephrologist (kidney doctor) today to discuss starting dialysis to help remove excess fluid and improve your breathing.  Important Information About Sugar         Signed, HSinclair Grooms MD  10/15/2021 2:06 PM    [facility]

## 2021-10-15 ENCOUNTER — Encounter: Payer: Medicare Other | Admitting: Interventional Cardiology

## 2021-10-15 ENCOUNTER — Encounter: Payer: Self-pay | Admitting: Interventional Cardiology

## 2021-10-15 VITALS — BP 138/62 | HR 52 | Ht 69.0 in | Wt 254.0 lb

## 2021-10-15 DIAGNOSIS — N185 Chronic kidney disease, stage 5: Secondary | ICD-10-CM | POA: Diagnosis not present

## 2021-10-15 DIAGNOSIS — I5032 Chronic diastolic (congestive) heart failure: Secondary | ICD-10-CM

## 2021-10-15 DIAGNOSIS — I2510 Atherosclerotic heart disease of native coronary artery without angina pectoris: Secondary | ICD-10-CM

## 2021-10-15 DIAGNOSIS — I10 Essential (primary) hypertension: Secondary | ICD-10-CM

## 2021-10-15 DIAGNOSIS — G4733 Obstructive sleep apnea (adult) (pediatric): Secondary | ICD-10-CM

## 2021-10-15 DIAGNOSIS — N1831 Chronic kidney disease, stage 3a: Secondary | ICD-10-CM

## 2021-10-15 DIAGNOSIS — E785 Hyperlipidemia, unspecified: Secondary | ICD-10-CM

## 2021-10-15 NOTE — Patient Instructions (Signed)
Medication Instructions:  Your physician recommends that you continue on your current medications as directed. Please refer to the Current Medication list given to you today.  *If you need a refill on your cardiac medications before your next appointment, please call your pharmacy*  Lab Work: NONE  Testing/Procedures: NONE  Follow-Up: At [facility], you and your health needs are our priority.  As part of our continuing mission to provide you with exceptional heart care, we have created designated Provider Care Teams.  These Care Teams include your primary Cardiologist (physician) and Advanced Practice Providers (APPs -  Physician Assistants and Nurse Practitioners) who all work together to provide you with the care you need, when you need it.  Your next appointment:   6 month(s)  The format for your next appointment:   In Person  Provider:   Lenna Sciara, MD  Other Instructions Dr. Tamala Julian strongly recommends you call your nephrologist (kidney doctor) today to discuss starting dialysis to help remove excess fluid and improve your breathing.  Important Information About Sugar

## 2021-10-17 ENCOUNTER — Emergency Department (HOSPITAL_COMMUNITY): Payer: Medicare Other

## 2021-10-17 ENCOUNTER — Encounter (HOSPITAL_COMMUNITY): Payer: Self-pay | Admitting: Pharmacy Technician

## 2021-10-17 ENCOUNTER — Inpatient Hospital Stay (HOSPITAL_COMMUNITY)
Admission: EM | Admit: 2021-10-17 | Discharge: 2021-10-26 | DRG: 291 | Disposition: A | Discharge status: Skilled Nursing Facility | Payer: Medicare Other | Attending: Internal Medicine | Admitting: Internal Medicine

## 2021-10-17 ENCOUNTER — Other Ambulatory Visit: Payer: Self-pay

## 2021-10-17 DIAGNOSIS — R0989 Other specified symptoms and signs involving the circulatory and respiratory systems: Secondary | ICD-10-CM

## 2021-10-17 DIAGNOSIS — I472 Ventricular tachycardia, unspecified: Secondary | ICD-10-CM | POA: Diagnosis not present

## 2021-10-17 DIAGNOSIS — E1165 Type 2 diabetes mellitus with hyperglycemia: Secondary | ICD-10-CM | POA: Diagnosis present

## 2021-10-17 DIAGNOSIS — M109 Gout, unspecified: Secondary | ICD-10-CM | POA: Diagnosis not present

## 2021-10-17 DIAGNOSIS — E1169 Type 2 diabetes mellitus with other specified complication: Secondary | ICD-10-CM | POA: Diagnosis present

## 2021-10-17 DIAGNOSIS — Z79890 Hormone replacement therapy (postmenopausal): Secondary | ICD-10-CM | POA: Diagnosis not present

## 2021-10-17 DIAGNOSIS — I10 Essential (primary) hypertension: Secondary | ICD-10-CM | POA: Diagnosis present

## 2021-10-17 DIAGNOSIS — M19071 Primary osteoarthritis, right ankle and foot: Secondary | ICD-10-CM | POA: Diagnosis present

## 2021-10-17 DIAGNOSIS — E1122 Type 2 diabetes mellitus with diabetic chronic kidney disease: Secondary | ICD-10-CM | POA: Diagnosis present

## 2021-10-17 DIAGNOSIS — Z992 Dependence on renal dialysis: Secondary | ICD-10-CM

## 2021-10-17 DIAGNOSIS — D631 Anemia in chronic kidney disease: Secondary | ICD-10-CM | POA: Diagnosis present

## 2021-10-17 DIAGNOSIS — G9341 Metabolic encephalopathy: Secondary | ICD-10-CM | POA: Diagnosis present

## 2021-10-17 DIAGNOSIS — D696 Thrombocytopenia, unspecified: Secondary | ICD-10-CM | POA: Diagnosis not present

## 2021-10-17 DIAGNOSIS — Z6838 Body mass index (BMI) 38.0-38.9, adult: Secondary | ICD-10-CM

## 2021-10-17 DIAGNOSIS — I2510 Atherosclerotic heart disease of native coronary artery without angina pectoris: Secondary | ICD-10-CM | POA: Diagnosis present

## 2021-10-17 DIAGNOSIS — E669 Obesity, unspecified: Secondary | ICD-10-CM | POA: Diagnosis present

## 2021-10-17 DIAGNOSIS — N189 Chronic kidney disease, unspecified: Principal | ICD-10-CM

## 2021-10-17 DIAGNOSIS — N185 Chronic kidney disease, stage 5: Secondary | ICD-10-CM | POA: Diagnosis present

## 2021-10-17 DIAGNOSIS — E785 Hyperlipidemia, unspecified: Secondary | ICD-10-CM | POA: Diagnosis present

## 2021-10-17 DIAGNOSIS — Z531 Procedure and treatment not carried out because of patient's decision for reasons of belief and group pressure: Secondary | ICD-10-CM | POA: Diagnosis present

## 2021-10-17 DIAGNOSIS — Z79899 Other long term (current) drug therapy: Secondary | ICD-10-CM

## 2021-10-17 DIAGNOSIS — Z8616 Personal history of COVID-19: Secondary | ICD-10-CM | POA: Diagnosis not present

## 2021-10-17 DIAGNOSIS — E039 Hypothyroidism, unspecified: Secondary | ICD-10-CM | POA: Diagnosis present

## 2021-10-17 DIAGNOSIS — Z955 Presence of coronary angioplasty implant and graft: Secondary | ICD-10-CM

## 2021-10-17 DIAGNOSIS — I5033 Acute on chronic diastolic (congestive) heart failure: Secondary | ICD-10-CM | POA: Diagnosis present

## 2021-10-17 DIAGNOSIS — N2581 Secondary hyperparathyroidism of renal origin: Secondary | ICD-10-CM | POA: Diagnosis present

## 2021-10-17 DIAGNOSIS — Z66 Do not resuscitate: Secondary | ICD-10-CM | POA: Diagnosis present

## 2021-10-17 DIAGNOSIS — E119 Type 2 diabetes mellitus without complications: Secondary | ICD-10-CM

## 2021-10-17 DIAGNOSIS — G4733 Obstructive sleep apnea (adult) (pediatric): Secondary | ICD-10-CM | POA: Diagnosis present

## 2021-10-17 DIAGNOSIS — Z7951 Long term (current) use of inhaled steroids: Secondary | ICD-10-CM

## 2021-10-17 DIAGNOSIS — J811 Chronic pulmonary edema: Secondary | ICD-10-CM | POA: Insufficient documentation

## 2021-10-17 DIAGNOSIS — I5032 Chronic diastolic (congestive) heart failure: Secondary | ICD-10-CM | POA: Diagnosis not present

## 2021-10-17 DIAGNOSIS — J810 Acute pulmonary edema: Secondary | ICD-10-CM

## 2021-10-17 DIAGNOSIS — Z833 Family history of diabetes mellitus: Secondary | ICD-10-CM

## 2021-10-17 DIAGNOSIS — Z96652 Presence of left artificial knee joint: Secondary | ICD-10-CM | POA: Diagnosis present

## 2021-10-17 DIAGNOSIS — F419 Anxiety disorder, unspecified: Secondary | ICD-10-CM | POA: Diagnosis present

## 2021-10-17 DIAGNOSIS — N186 End stage renal disease: Secondary | ICD-10-CM | POA: Diagnosis present

## 2021-10-17 DIAGNOSIS — M10371 Gout due to renal impairment, right ankle and foot: Secondary | ICD-10-CM | POA: Diagnosis not present

## 2021-10-17 DIAGNOSIS — R0602 Shortness of breath: Secondary | ICD-10-CM | POA: Diagnosis present

## 2021-10-17 DIAGNOSIS — Z882 Allergy status to sulfonamides status: Secondary | ICD-10-CM

## 2021-10-17 DIAGNOSIS — I132 Hypertensive heart and chronic kidney disease with heart failure and with stage 5 chronic kidney disease, or end stage renal disease: Principal | ICD-10-CM | POA: Diagnosis present

## 2021-10-17 DIAGNOSIS — Z8042 Family history of malignant neoplasm of prostate: Secondary | ICD-10-CM

## 2021-10-17 DIAGNOSIS — Z8249 Family history of ischemic heart disease and other diseases of the circulatory system: Secondary | ICD-10-CM

## 2021-10-17 DIAGNOSIS — Z7982 Long term (current) use of aspirin: Secondary | ICD-10-CM

## 2021-10-17 DIAGNOSIS — Z7902 Long term (current) use of antithrombotics/antiplatelets: Secondary | ICD-10-CM

## 2021-10-17 LAB — CBC
HCT: 27.6 % — ABNORMAL LOW (ref 36.0–46.0)
HCT: 28.3 % — ABNORMAL LOW (ref 36.0–46.0)
Hemoglobin: 8.8 g/dL — ABNORMAL LOW (ref 12.0–15.0)
Hemoglobin: 8.8 g/dL — ABNORMAL LOW (ref 12.0–15.0)
MCH: 28.8 pg (ref 26.0–34.0)
MCH: 27.8 pg (ref 26.0–34.0)
MCHC: 31.9 g/dL (ref 30.0–36.0)
MCHC: 31.1 g/dL (ref 30.0–36.0)
MCV: 90.2 fL (ref 80.0–100.0)
MCV: 89.6 fL (ref 80.0–100.0)
Platelets: 103 10*3/uL — ABNORMAL LOW (ref 150–400)
Platelets: 109 10*3/uL — ABNORMAL LOW (ref 150–400)
RBC: 3.06 MIL/uL — ABNORMAL LOW (ref 3.87–5.11)
RBC: 3.16 MIL/uL — ABNORMAL LOW (ref 3.87–5.11)
RDW: 19.2 % — ABNORMAL HIGH (ref 11.5–15.5)
RDW: 19.1 % — ABNORMAL HIGH (ref 11.5–15.5)
WBC: 4.8 10*3/uL (ref 4.0–10.5)
WBC: 4.8 10*3/uL (ref 4.0–10.5)
nRBC: 0 % (ref 0.0–0.2)
nRBC: 0 % (ref 0.0–0.2)

## 2021-10-17 LAB — BASIC METABOLIC PANEL
Anion gap: 12 (ref 5–15)
BUN: 124 mg/dL — ABNORMAL HIGH (ref 8–23)
CO2: 22 mmol/L (ref 22–32)
Calcium: 10 mg/dL (ref 8.9–10.3)
Chloride: 110 mmol/L (ref 98–111)
Creatinine, Ser: 6.12 mg/dL — ABNORMAL HIGH (ref 0.44–1.00)
GFR, Estimated: 7 mL/min — ABNORMAL LOW (ref >60)
Glucose, Bld: 183 mg/dL — ABNORMAL HIGH (ref 70–99)
Potassium: 3.8 mmol/L (ref 3.5–5.1)
Sodium: 144 mmol/L (ref 135–145)

## 2021-10-17 LAB — BRAIN NATRIURETIC PEPTIDE: B Natriuretic Peptide: 862.8 pg/mL — ABNORMAL HIGH (ref 0.0–100.0)

## 2021-10-17 LAB — CREATININE, SERUM
Creatinine, Ser: 5.94 mg/dL — ABNORMAL HIGH (ref 0.44–1.00)
GFR, Estimated: 7 mL/min — ABNORMAL LOW (ref >60)

## 2021-10-17 LAB — HEMOGLOBIN A1C
Hgb A1c MFr Bld: 6.2 % — ABNORMAL HIGH (ref 4.8–5.6)
Mean Plasma Glucose: 131.24 mg/dL

## 2021-10-17 LAB — GLUCOSE, CAPILLARY: Glucose-Capillary: 224 mg/dL — ABNORMAL HIGH (ref 70–99)

## 2021-10-17 MED ORDER — HEPARIN SODIUM (PORCINE) 5000 UNIT/ML IJ SOLN
5000.0000 [IU] | Freq: Three times a day (TID) | INTRAMUSCULAR | Status: DC
  Administered 2021-10-17 – 2021-10-19 (×5): 5000 [IU] via SUBCUTANEOUS
  Filled 2021-10-17 (×5): qty 1

## 2021-10-17 MED ORDER — ONDANSETRON HCL 4 MG/2ML IJ SOLN: 4.0000 mg | Freq: Four times a day (QID) | INTRAMUSCULAR | Status: DC | PRN

## 2021-10-17 MED ORDER — ALBUTEROL SULFATE (2.5 MG/3ML) 0.083% IN NEBU: 3.0000 mL | INHALATION_SOLUTION | Freq: Four times a day (QID) | RESPIRATORY_TRACT | Status: DC | PRN

## 2021-10-17 MED ORDER — AMLODIPINE BESYLATE 10 MG PO TABS
10.0000 mg | ORAL_TABLET | Freq: Two times a day (BID) | ORAL | Status: DC
  Administered 2021-10-17 – 2021-10-18 (×3): 10 mg via ORAL
  Filled 2021-10-17 (×3): qty 1

## 2021-10-17 MED ORDER — FUROSEMIDE 10 MG/ML IJ SOLN
40.0000 mg | Freq: Two times a day (BID) | INTRAMUSCULAR | Status: DC
  Administered 2021-10-17 – 2021-10-18 (×2): 40 mg via INTRAVENOUS
  Filled 2021-10-17 (×2): qty 4

## 2021-10-17 MED ORDER — SENNA 8.6 MG PO TABS
1.0000 | ORAL_TABLET | Freq: Two times a day (BID) | ORAL | Status: DC
  Administered 2021-10-17 – 2021-10-25 (×6): 8.6 mg via ORAL
  Filled 2021-10-17 (×11): qty 1

## 2021-10-17 MED ORDER — ATORVASTATIN CALCIUM 80 MG PO TABS
80.0000 mg | ORAL_TABLET | Freq: Every day | ORAL | Status: DC
  Administered 2021-10-17 – 2021-10-25 (×9): 80 mg via ORAL
  Filled 2021-10-17 (×9): qty 1

## 2021-10-17 MED ORDER — LEVOTHYROXINE SODIUM 100 MCG PO TABS
200.0000 ug | ORAL_TABLET | Freq: Every morning | ORAL | Status: DC
  Administered 2021-10-18 – 2021-10-25 (×8): 200 ug via ORAL
  Filled 2021-10-17 (×8): qty 2

## 2021-10-17 MED ORDER — INSULIN ASPART 100 UNIT/ML IJ SOLN
0.0000 [IU] | Freq: Three times a day (TID) | INTRAMUSCULAR | Status: DC
  Administered 2021-10-18 – 2021-10-23 (×8): 1 [IU] via SUBCUTANEOUS
  Administered 2021-10-24: 2 [IU] via SUBCUTANEOUS
  Administered 2021-10-25 (×2): 1 [IU] via SUBCUTANEOUS
  Filled 2021-10-17: qty 0.06

## 2021-10-17 MED ORDER — ASPIRIN 81 MG PO TBEC
81.0000 mg | DELAYED_RELEASE_TABLET | Freq: Every evening | ORAL | Status: DC
  Administered 2021-10-17 – 2021-10-25 (×7): 81 mg via ORAL
  Filled 2021-10-17 (×6): qty 1

## 2021-10-17 MED ORDER — ACETAMINOPHEN 325 MG PO TABS
650.0000 mg | ORAL_TABLET | Freq: Four times a day (QID) | ORAL | Status: DC | PRN
  Administered 2021-10-18 – 2021-10-24 (×15): 650 mg via ORAL
  Filled 2021-10-17 (×16): qty 2

## 2021-10-17 MED ORDER — CARVEDILOL 25 MG PO TABS
25.0000 mg | ORAL_TABLET | Freq: Two times a day (BID) | ORAL | Status: DC
  Administered 2021-10-17: 25 mg via ORAL
  Filled 2021-10-17: qty 1

## 2021-10-17 MED ORDER — DOCUSATE SODIUM 100 MG PO CAPS
100.0000 mg | ORAL_CAPSULE | Freq: Two times a day (BID) | ORAL | Status: DC
  Administered 2021-10-17 – 2021-10-22 (×4): 100 mg via ORAL
  Filled 2021-10-17 (×8): qty 1

## 2021-10-17 MED ORDER — ONDANSETRON HCL 4 MG PO TABS: 4.0000 mg | ORAL_TABLET | Freq: Four times a day (QID) | ORAL | Status: DC | PRN

## 2021-10-17 MED ORDER — CLOPIDOGREL BISULFATE 75 MG PO TABS
75.0000 mg | ORAL_TABLET | Freq: Every day | ORAL | Status: DC
  Administered 2021-10-17 – 2021-10-25 (×7): 75 mg via ORAL
  Filled 2021-10-17 (×8): qty 1

## 2021-10-17 MED ORDER — FUROSEMIDE 10 MG/ML IJ SOLN: 40.0000 mg | Freq: Two times a day (BID) | INTRAMUSCULAR | Status: DC

## 2021-10-17 MED ORDER — ACETAMINOPHEN 650 MG RE SUPP: 650.0000 mg | Freq: Four times a day (QID) | RECTAL | Status: DC | PRN

## 2021-10-17 MED ORDER — FUROSEMIDE 10 MG/ML IJ SOLN
80.0000 mg | Freq: Once | INTRAMUSCULAR | Status: AC
  Administered 2021-10-17: 80 mg via INTRAVENOUS
  Filled 2021-10-17: qty 8

## 2021-10-17 NOTE — ED Notes (Signed)
Pt has dyspnea on exertion and therefore chest x-ray was changed to portable as standing takes too much effort at this time.

## 2021-10-17 NOTE — ED Notes (Signed)
Pt has left in the care of [facility], [facility] to give report to inpatient receiving RN on arrival.

## 2021-10-17 NOTE — ED Notes (Signed)
Attempted to call report to inpatient RN [id number], nurse refuse report at this time, Amy, RN to call back for report, advised [facility] is aware of transport, will await call back for report

## 2021-10-17 NOTE — ED Triage Notes (Signed)
Pt here with reports of shob for "a while". Endorses worsening of symptoms over the last few days.

## 2021-10-17 NOTE — ED Notes (Signed)
[facility] has arrived for transport 

## 2021-10-17 NOTE — ED Provider Triage Note (Signed)
Emergency Medicine Provider Triage Evaluation Note  Tiffany [name] , a 71 y.o. female  was evaluated in triage.  Pt complains of sob. Progressive worsening SOB x 5 days, increased with talking and with laying.  Notice weight gain.  No fever, chills, productive cough, chest pain, hemoptysis  Review of Systems  Positive: As above Negative: As above  Physical Exam  BP 130/63 (BP Location: Right Arm)   Pulse (!) 50   Temp 98 F (36.7 C) (Oral)   Resp 18   SpO2 91%  Gen:   Awake, no distress   Resp:  Normal effort  MSK:   Moves extremities without difficulty  Other:    Medical Decision Making  Medically screening exam initiated at 3:24 PM.  Appropriate orders placed.  Tiffany [name] was informed that the remainder of the evaluation will be completed by another provider, this initial triage assessment does not replace that evaluation, and the importance of remaining in the ED until their evaluation is complete.     Domenic Moras, PA-C 10/17/21 1528

## 2021-10-17 NOTE — H&P (Addendum)
History and Physical    Gini Caputo HAL:[id number] DOB: [birth year] DOA: 10/17/2021  PCP: Nolene Ebbs, MD   Patient coming from: Home  I have personally briefly reviewed patient's old medical records in [place]  Chief Complaint: Worsening shortness of breath.  HPI: Vivica Dobosz is a 71 y.o. female with PMH significant for CKD stage IV-V, chronic diastolic CHF (last echo 7/35 LVEF 65 to 70%), Essential hypertension, hyperlipidemia, iron deficiency anemia, type 2 diabetes, OSA on CPAP, CAD presented in the ED with generalized weakness, worsening shortness of breath.  Patient report she has stage IV kidney disease for last 2 years and was advised hemodialysis in the past but she has been declining.  She also reported she was able to ambulate 4 weeks ago, at that time she was prescribed Lyrica for sciatica, states she has developed generalized weakness, reports fatigue and tiredness, worsening shortness of breath even at rest after medication was started.   She went to see her cardiologist 2 days ago and she has agreed for hemodialysis.  Patient reports she was unable to lie down, has profound bilateral lower extremity swelling, gets short of breath even at rest.  She was advised to come to the ED for further evaluation.  ED Course: She was hemodynamically stable except bradycardia. Temp 98, HR 50, RR 14, BP 130/63, SPO2 94% on room air Labs include sodium 144, potassium 3.9, chloride 110, bicarb 22, glucose 183, BUN 124, creatinine 6.12, calcium 10.0, anion gap 12, phosphorus 6.2, BNP 862.8, WBC 4.8, hemoglobin 8.8, hematocrit 27.6, platelet 103, Chest x-ray: Cardiomegaly with increased pulmonary vascular congestion/early interstitial edema. EKG: Sinus bradycardia, no ST-T wave changes  Review of Systems:  Review of Systems  Constitutional:  Positive for malaise/fatigue.  HENT: Negative.    Eyes: Negative.   Respiratory:  Positive for shortness of breath.   Cardiovascular:   Positive for orthopnea and leg swelling.  Gastrointestinal: Negative.   Genitourinary: Negative.   Musculoskeletal:  Positive for back pain.  Skin: Negative.   Neurological:  Positive for weakness.  Endo/Heme/Allergies: Negative.   Psychiatric/Behavioral: Negative.       Past Medical History:  Diagnosis Date   ALLERGIC RHINITIS    Arthritis    CHF (congestive heart failure) (HCC)    CKD (chronic kidney disease)    COVID 01/21/2021   + HOME TEST  HAD COUGH CONGESTION AND MALAISE, OCC CONGESTION NOW   Heart murmur    at birth   Hyperlipidemia    Hypertension    Hypothyroid    Iron deficiency anemia    Obesity    OSA on CPAP    Refusal of blood transfusions as patient is Jehov[employer]'s Witness    Shingles 11/2020   LEFT BACK   Type 2 dm with insulin use    checks abg qday runs 125   Wears glasses     Past Surgical History:  Procedure Laterality Date   BREAST BIOPSY     left breast per pt; benign 20+ yrs ago   [place]   COLONOSCOPY WITH PROPOFOL N/A 10/24/2020   Procedure: COLONOSCOPY WITH PROPOFOL;  Surgeon: Ronnette Juniper, MD;  Location: [facility];  Service: Gastroenterology;  Laterality: N/A;   CORONARY STENT INTERVENTION N/A 05/12/2021   Procedure: CORONARY STENT INTERVENTION;  Surgeon: Early Osmond, MD;  Location: [place] [facility];  Service: Cardiovascular;  Laterality: N/A;   CORONARY/GRAFT ACUTE MI REVASCULARIZATION N/A 05/12/2021   Procedure: Coronary/Graft Acute MI Revascularization;  Surgeon: Early Osmond, MD;  Location: [place] [facility];  Service: Cardiovascular;  Laterality: N/A;   KNEE SURGERY Bilateral    x 2   LEFT HEART CATH AND CORONARY ANGIOGRAPHY N/A 05/12/2021   Procedure: LEFT HEART CATH AND CORONARY ANGIOGRAPHY;  Surgeon: Early Osmond, MD;  Location: [place] [facility];  Service: Cardiovascular;  Laterality: N/A;   LUMBAR DISC SURGERY     x 2   POLYPECTOMY  10/24/2020   Procedure: POLYPECTOMY;  Surgeon: Ronnette Juniper,  MD;  Location: [facility];  Service: Gastroenterology;;   REDUCTION MAMMAPLASTY Bilateral 1985   TOE SURGERY     yrs ago per pt on 02-02-2021   TOTAL ABDOMINAL HYSTERECTOMY  1997   partial hysterectomy- still has ovaries   TOTAL KNEE ARTHROPLASTY Left 07/01/2019   Procedure: TOTAL KNEE ARTHROPLASTY;  Surgeon: Paralee Cancel, MD;  Location: [facility];  Service: Orthopedics;  Laterality: Left;  70 min NO BLOOD PRODUCTS!!   TREATMENT FISTULA ANAL     yrs ago per pt 0n 02-02-2021   TRIGGER FINGER RELEASE Left 02/08/2021   Procedure: Left Ring trigger finger release, left ring finger volar ganglion cyst excision;  Surgeon: Orene Desanctis, MD;  Location: [facility];  Service: Orthopedics;  Laterality: Left;  with local anesthesia     reports that she has never smoked. She has never used smokeless tobacco. She reports that she does not currently use alcohol. She reports that she does not use drugs.  Allergies  Allergen Reactions   Other     No blood products   Sulfa Antibiotics Rash    Family History  Problem Relation Age of Onset   Prostate cancer Father    Congestive Heart Failure Mother    Diabetes Sister    Other Sister        septis   Breast cancer Neg Hx     Family history reviewed and not pertinent.  Prior to Admission medications   Medication Sig Start Date End Date Taking? Authorizing Provider  albuterol (VENTOLIN HFA) 108 (90 Base) MCG/ACT inhaler Inhale 2 puffs into the lungs every 6 (six) hours as needed for wheezing or shortness of breath.    [provider]  amLODipine (NORVASC) 10 MG tablet Take 10 mg by mouth 2 (two) times daily.    [provider]  aspirin EC 81 MG tablet Take 81 mg by mouth every evening. Swallow whole.    [provider]  atorvastatin (LIPITOR) 80 MG tablet Take 1 tablet (80 mg total) by mouth at bedtime. 05/20/21   Cheryln Manly, NP  B Complex-C (B-COMPLEX WITH VITAMIN C) tablet Take 1 tablet by mouth in  the morning.    [provider]  budesonide-formoterol (SYMBICORT) 160-4.5 MCG/ACT inhaler Inhale 2 puffs then rinse mouth, twice daily Patient taking differently: Inhale 2 puffs into the lungs 2 (two) times daily as needed (wheezing or shortness of breath). 09/25/20   Baird Lyons D, MD  calcitRIOL (ROCALTROL) 0.5 MCG capsule Take 0.5 mcg by mouth in the morning. 08/03/20   [provider]  carvedilol (COREG) 25 MG tablet Take 1 tablet (25 mg total) by mouth 2 (two) times daily with a meal. 05/20/21   Cheryln Manly, NP  cetirizine (ZYRTEC) 10 MG tablet Take 10 mg by mouth daily as needed for allergies.    [provider]  cloNIDine (CATAPRES - DOSED IN MG/24 HR) 0.3 mg/24hr patch Place 0.3 mg onto the skin every Friday.  [provider]  clopidogrel (PLAVIX) 75 MG tablet Take 1 tablet (75 mg total) by mouth daily. 05/21/21   Cheryln Manly, NP  fluticasone Asencion Islam) 50 MCG/ACT nasal spray SMARTSIG:2 Spray(s) Both Nares Every Evening 08/21/21   [provider]  hydrALAZINE (APRESOLINE) 50 MG tablet Take 50 mg by mouth 2 (two) times daily. 08/03/21   [provider]  levothyroxine (SYNTHROID) 200 MCG tablet Take 200 mcg by mouth every morning. 09/25/21   [provider]  Multiple Vitamins-Minerals (PRESERVISION AREDS 2 PO) Take 1 tablet by mouth in the morning.    [provider]  nitroGLYCERIN (NITROSTAT) 0.4 MG SL tablet Place 1 tablet (0.4 mg total) under the tongue every 5 (five) minutes x 3 doses as needed for chest pain. 05/20/21   Cheryln Manly, NP  Omega-3 Fatty Acids (FISH OIL) 500 MG CAPS Take 500 mg by mouth at bedtime.    [provider]  ondansetron (ZOFRAN) 4 MG tablet Take 1 tablet (4 mg total) by mouth every 6 (six) hours as needed for nausea or vomiting. 05/20/21   Sande Rives E, PA-C  pregabalin (LYRICA) 50 MG capsule Take 1 tablet 50 mg by mouth at bedtime. 09/11/21   Lorine Bears, NP   Propylene Glycol (SYSTANE COMPLETE) 0.6 % SOLN Place 1 drop into both eyes in the morning, at noon, in the evening, and at bedtime.    [provider]  senna (SENOKOT) 8.6 MG TABS tablet Take 2 tablets (17.2 mg total) by mouth at bedtime. 06/02/21   Talbot Grumbling, FNP  torsemide (DEMADEX) 100 MG tablet Take 200 mg by mouth daily.    [provider]  traZODone (DESYREL) 100 MG tablet Take 100 mg by mouth at bedtime.    [provider]    Physical Exam: Vitals:   10/17/21 1457 10/17/21 1530  BP: 130/63 (!) 134/91  Pulse: (!) 50 (!) 49  Resp: 18 14  Temp: 98 F (36.7 C)   TempSrc: Oral   SpO2: 91% 94%    Constitutional: Appears comfortable, not in any acute distress.  Deconditioned Vitals:   10/17/21 1457 10/17/21 1530  BP: 130/63 (!) 134/91  Pulse: (!) 50 (!) 49  Resp: 18 14  Temp: 98 F (36.7 C)   TempSrc: Oral   SpO2: 91% 94%   Eyes: PERRL, lids and conjunctivae normal ENMT: Mucous membranes are moist. Posterior pharynx clear of any exudate or lesions. Neck: normal, supple, no masses, no thyromegaly Respiratory: Clear to auscultation bilaterally, no wheezing, no crackles, normal respiratory effort, RR 14 Cardiovascular: Regular rate and rhythm, S1-S2 heard, murmur+ Abdomen: Soft, non tender, non distended, BS+ Musculoskeletal: Edema+ no cyanosis, no clubbing.  Able to move all 4 limbs.  Normal muscle tone.  Skin: no rashes, lesions, ulcers. No induration Neurologic: CN 2-12 grossly intact. Sensation intact, DTR normal. Strength 5/5 in all 4.  Psychiatric: Normal judgment and insight. Alert and oriented x 3. Normal mood.    Labs on Admission: I have personally reviewed following labs and imaging studies  CBC: Recent Labs  Lab 10/17/21 1520  WBC 4.8  HGB 8.8*  HCT 27.6*  MCV 90.2  PLT 967*   Basic Metabolic Panel: Recent Labs  Lab 10/17/21 1520  NA 144  K 3.8  CL 110  CO2 22  GLUCOSE 183*  BUN 124*  CREATININE 6.12*   CALCIUM 10.0   GFR: Estimated Creatinine Clearance: 11.6 mL/min (A) (by C-G formula based on SCr of 6.12  mg/dL (H)). Liver Function Tests: No results for input(s): "AST", "ALT", "ALKPHOS", "BILITOT", "PROT", "ALBUMIN" in the last 168 hours. No results for input(s): "LIPASE", "AMYLASE" in the last 168 hours. No results for input(s): "AMMONIA" in the last 168 hours. Coagulation Profile: No results for input(s): "INR", "PROTIME" in the last 168 hours. Cardiac Enzymes: No results for input(s): "CKTOTAL", "CKMB", "CKMBINDEX", "TROPONINI" in the last 168 hours. BNP (last 3 results) No results for input(s): "PROBNP" in the last 8760 hours. HbA1C: No results for input(s): "HGBA1C" in the last 72 hours. CBG: No results for input(s): "GLUCAP" in the last 168 hours. Lipid Profile: No results for input(s): "CHOL", "HDL", "LDLCALC", "TRIG", "CHOLHDL", "LDLDIRECT" in the last 72 hours. Thyroid Function Tests: No results for input(s): "TSH", "T4TOTAL", "FREET4", "T3FREE", "THYROIDAB" in the last 72 hours. Anemia Panel: No results for input(s): "VITAMINB12", "FOLATE", "FERRITIN", "TIBC", "IRON", "RETICCTPCT" in the last 72 hours. Urine analysis:    Component Value Date/Time   COLORURINE YELLOW 04/17/2020 [place] 04/17/2020 1222   LABSPEC 1.020 12/27/2020 1610   PHURINE 7.0 12/27/2020 1610   GLUCOSEU NEGATIVE 12/27/2020 1610   [place] 12/27/2020 [place] 12/27/2020 1610   [place] 12/27/2020 1610   PROTEINUR >=300 (A) 12/27/2020 1610   UROBILINOGEN 0.2 12/27/2020 1610   NITRITE NEGATIVE 12/27/2020 1610   LEUKOCYTESUR NEGATIVE 12/27/2020 1610    Radiological Exams on Admission: DG Chest Portable 1 View  Result Date: 10/17/2021 CLINICAL DATA:  Worsening shortness of breath for 5 days. EXAM: PORTABLE CHEST 1 VIEW COMPARISON:  Chest radiograph 05/12/2021 FINDINGS: The cardiac silhouette remains enlarged. Aortic atherosclerosis is noted.  Compared to the prior study, there is increased pulmonary vascular congestion with mild prominence of the interstitial markings. No airspace consolidation, sizeable pleural effusion, or pneumothorax is identified. No acute osseous abnormality is seen. IMPRESSION: Cardiomegaly with increased pulmonary vascular congestion/early interstitial edema. Electronically Signed   By: Logan Bores M.D.   On: 10/17/2021 16:17    EKG: Independently reviewed.  Sinus bradycardia  Assessment/Plan Principal Problem:   Pulmonary edema Active Problems:   Hypothyroidism   OBESITY   Obstructive sleep apnea   Chronic diastolic heart failure (HCC)   Essential hypertension   CKD (chronic kidney disease), stage V (HCC)   Hyperlipidemia   Type 2 diabetes mellitus without complications (HCC)   ESRD (end stage renal disease) (HCC)  Pulmonary edema: Acute on chronic diastolic CHF: Patient presented with worsening shortness of breath, bilateral pedal edema, orthopnea, elevated BNP, chest x-ray consistent with interstitial edema.   Patient reports symptoms started after She started on Lyrica that could be triggering factor. Patient received Lasix 80 mg IV once. Continue Lasix 40 mg IV every 12 hours. Monitor daily weight, intake output charting. At home takes Coreg, torsemide, aspirin and Plavix. Nephrology is consulted,  patient needs initiation of hemodialysis.  ESRD :  Patient has CKD stage IV-V and has been advised hemodialysis in the past. Patient has been declining HD, now presented with fluid overload. Patient has agreed to start hemodialysis. Nephrology Dr. Joelyn Oms was notified. Keep n.p.o. for temporary hemodialysis catheter placement tomorrow.  Hypothyroidism: Continue levothyroxine 200 mcg daily.  Essential hypertension: Continue Coreg, amlodipine, clonidine, hydralazine.  Generalized weakness: Patient reports weakness started after she is started on Lyrica for sciatica. Hold Lyrica, PT and OT  evaluation.  Obesity Diet and exercise discussed in detail  Hyperlipidemia: Continue Lipitor 80 mg daily  Type 2 diabetes: Carb modified diet, obtain hemoglobin A1c  Sliding scale.  Hold p.o. diabetic medications  Obstructive sleep apnea: Continue CPAP at night.  CAD: Stable.  Continue aspirin, Plavix, Lipitor, Coreg.   DVT prophylaxis: Heparin Code Status: DNR Family Communication: No family at bed side. Disposition Plan:   Status is: Inpatient Remains inpatient appropriate because: Admitted for fluid overload /pulmonary edema, end-stage renal disease anticipating hemodialysis.  Nephrology and cardiology is consulted.   Consults called: Nephrology, consult cardiology in the morning. Admission status: Inpatient   Shawna Clamp MD [facility]   If 7PM-7AM, please contact night-coverage [email or web address] Password [email or web address]  10/17/2021, 6:01 PM

## 2021-10-17 NOTE — ED Provider Notes (Signed)
[facility] DEPT Provider Note   CSN: [id number] Arrival date & time: 10/17/21  1445     History  Chief Complaint  Patient presents with   Shortness of Breath    Tiffany [name] is a 71 y.o. female.   Shortness of Breath    Patient has history of hypothyroidism, hypertension, diabetes, anemia, obstructive sleep apnea, CHF who presents to the ED with complaints of shortness of breath.  Pt saw her cardiologist on 9/11 with the same complaints.  Pt has not been able to walk far without being short of breath.  She tried stopping a new medication, lyric without relief.  Her symptoms were felt to be related to her chronic kidney disease and volume overload.  Patient had previously had not wanted dialysis but is now considering that.  Patient states she contacted her kidney doctor and they were going to try to arrange for an outpatient dialysis catheter.  Patient states however she was too weak and short of breath to go to the outpatient center.  She was told by her nephrologist that she could come to the hospital to have it done.  Patient's goal is to have peritoneal dialysis ultimately  Home Medications Prior to Admission medications   Medication Sig Start Date End Date Taking? Authorizing Provider  albuterol (VENTOLIN HFA) 108 (90 Base) MCG/ACT inhaler Inhale 2 puffs into the lungs every 6 (six) hours as needed for wheezing or shortness of breath.    [provider]  amLODipine (NORVASC) 10 MG tablet Take 10 mg by mouth 2 (two) times daily.    [provider]  aspirin EC 81 MG tablet Take 81 mg by mouth every evening. Swallow whole.    [provider]  atorvastatin (LIPITOR) 80 MG tablet Take 1 tablet (80 mg total) by mouth at bedtime. 05/20/21   Cheryln Manly, NP  B Complex-C (B-COMPLEX WITH VITAMIN C) tablet Take 1 tablet by mouth in the morning.    [provider]  budesonide-formoterol (SYMBICORT) 160-4.5 MCG/ACT  inhaler Inhale 2 puffs then rinse mouth, twice daily Patient taking differently: Inhale 2 puffs into the lungs 2 (two) times daily as needed (wheezing or shortness of breath). 09/25/20   Baird Lyons D, MD  calcitRIOL (ROCALTROL) 0.5 MCG capsule Take 0.5 mcg by mouth in the morning. 08/03/20   [provider]  carvedilol (COREG) 25 MG tablet Take 1 tablet (25 mg total) by mouth 2 (two) times daily with a meal. 05/20/21   Cheryln Manly, NP  cetirizine (ZYRTEC) 10 MG tablet Take 10 mg by mouth daily as needed for allergies.    [provider]  cloNIDine (CATAPRES - DOSED IN MG/24 HR) 0.3 mg/24hr patch Place 0.3 mg onto the skin every Friday.    [provider]  clopidogrel (PLAVIX) 75 MG tablet Take 1 tablet (75 mg total) by mouth daily. 05/21/21   Cheryln Manly, NP  fluticasone Asencion Islam) 50 MCG/ACT nasal spray SMARTSIG:2 Spray(s) Both Nares Every Evening 08/21/21   [provider]  hydrALAZINE (APRESOLINE) 50 MG tablet Take 50 mg by mouth 2 (two) times daily. 08/03/21   [provider]  levothyroxine (SYNTHROID) 200 MCG tablet Take 200 mcg by mouth every morning. 09/25/21   [provider]  Multiple Vitamins-Minerals (PRESERVISION AREDS 2 PO) Take 1 tablet by mouth in the morning.    [provider]  nitroGLYCERIN (NITROSTAT) 0.4 MG SL tablet Place 1 tablet (0.4 mg total) under the tongue  every 5 (five) minutes x 3 doses as needed for chest pain. 05/20/21   Cheryln Manly, NP  Omega-3 Fatty Acids (FISH OIL) 500 MG CAPS Take 500 mg by mouth at bedtime.    [provider]  ondansetron (ZOFRAN) 4 MG tablet Take 1 tablet (4 mg total) by mouth every 6 (six) hours as needed for nausea or vomiting. 05/20/21   Sande Rives E, PA-C  pregabalin (LYRICA) 50 MG capsule Take 1 tablet 50 mg by mouth at bedtime. 09/11/21   Lorine Bears, NP  Propylene Glycol (SYSTANE COMPLETE) 0.6 % SOLN Place 1 drop into both eyes in the morning, at  noon, in the evening, and at bedtime.    [provider]  senna (SENOKOT) 8.6 MG TABS tablet Take 2 tablets (17.2 mg total) by mouth at bedtime. 06/02/21   Talbot Grumbling, FNP  torsemide (DEMADEX) 100 MG tablet Take 200 mg by mouth daily.    [provider]  traZODone (DESYREL) 100 MG tablet Take 100 mg by mouth at bedtime.    [provider]      Allergies    Other and Sulfa antibiotics    Review of Systems   Review of Systems  Respiratory:  Positive for shortness of breath.     Physical Exam Updated Vital Signs BP (!) 134/91   Pulse (!) 49   Temp 98 F (36.7 C) (Oral)   Resp 14   SpO2 94%  Physical Exam Vitals and nursing note reviewed.  Constitutional:      Appearance: She is well-developed. She is not diaphoretic.  HENT:     Head: Normocephalic and atraumatic.     Comments: Mild periorbital edema noted    Right Ear: External ear normal.     Left Ear: External ear normal.  Eyes:     General: No scleral icterus.       Right eye: No discharge.        Left eye: No discharge.     Conjunctiva/sclera: Conjunctivae normal.  Neck:     Trachea: No tracheal deviation.  Cardiovascular:     Rate and Rhythm: Normal rate.  Pulmonary:     Effort: Pulmonary effort is normal. No respiratory distress.     Breath sounds: No stridor. No rales.  Abdominal:     General: There is no distension.  Musculoskeletal:        General: No swelling or deformity.     Cervical back: Neck supple.     Right lower leg: Edema present.     Left lower leg: Edema present.  Skin:    General: Skin is warm and dry.     Findings: No rash.  Neurological:     Mental Status: She is alert.     Cranial Nerves: Cranial nerve deficit: no gross deficits.     ED Results / Procedures / Treatments   Labs (all labs ordered are listed, but only abnormal results are displayed) Labs Reviewed  BASIC METABOLIC PANEL - Abnormal; Notable for the following components:      Result  Value   Glucose, Bld 183 (*)    BUN 124 (*)    Creatinine, Ser 6.12 (*)    GFR, Estimated 7 (*)    All other components within normal limits  CBC - Abnormal; Notable for the following components:   RBC 3.06 (*)    Hemoglobin 8.8 (*)    HCT 27.6 (*)    RDW 19.2 (*)  Platelets 103 (*)    All other components within normal limits  BRAIN NATRIURETIC PEPTIDE - Abnormal; Notable for the following components:   B Natriuretic Peptide 862.8 (*)    All other components within normal limits  CBC  CREATININE, SERUM  COMPREHENSIVE METABOLIC PANEL  CBC  MAGNESIUM  PHOSPHORUS  HEMOGLOBIN A1C    EKG EKG Interpretation  Date/Time:  Wednesday October 17 2021 15:01:46 EDT Ventricular Rate:  52 PR Interval:  183 QRS Duration: 91 QT Interval:  530 QTC Calculation: 493 R Axis:   20 Text Interpretation: Sinus rhythm Low voltage, extremity and precordial leads , new since last tracing Probable anteroseptal infarct, old Confirmed by Dorie Rank [phone]) on 10/17/2021 3:05:15 PM  Radiology DG Chest Portable 1 View  Result Date: 10/17/2021 CLINICAL DATA:  Worsening shortness of breath for 5 days. EXAM: PORTABLE CHEST 1 VIEW COMPARISON:  Chest radiograph 05/12/2021 FINDINGS: The cardiac silhouette remains enlarged. Aortic atherosclerosis is noted. Compared to the prior study, there is increased pulmonary vascular congestion with mild prominence of the interstitial markings. No airspace consolidation, sizeable pleural effusion, or pneumothorax is identified. No acute osseous abnormality is seen. IMPRESSION: Cardiomegaly with increased pulmonary vascular congestion/early interstitial edema. Electronically Signed   By: Logan Bores M.D.   On: 10/17/2021 16:17    Procedures Procedures    Medications Ordered in ED Medications  furosemide (LASIX) injection 80 mg (has no administration in time range)  aspirin EC tablet 81 mg (has no administration in time range)  amLODipine (NORVASC) tablet 10 mg  (has no administration in time range)  atorvastatin (LIPITOR) tablet 80 mg (has no administration in time range)  carvedilol (COREG) tablet 25 mg (has no administration in time range)  levothyroxine (SYNTHROID) tablet 200 mcg (has no administration in time range)  clopidogrel (PLAVIX) tablet 75 mg (has no administration in time range)  albuterol (VENTOLIN HFA) 108 (90 Base) MCG/ACT inhaler 2 puff (has no administration in time range)  heparin injection 5,000 Units (has no administration in time range)  acetaminophen (TYLENOL) tablet 650 mg (has no administration in time range)    Or  acetaminophen (TYLENOL) suppository 650 mg (has no administration in time range)  docusate sodium (COLACE) capsule 100 mg (has no administration in time range)  senna (SENOKOT) tablet 8.6 mg (has no administration in time range)  ondansetron (ZOFRAN) tablet 4 mg (has no administration in time range)    Or  ondansetron (ZOFRAN) injection 4 mg (has no administration in time range)  furosemide (LASIX) injection 40 mg (has no administration in time range)  insulin aspart (novoLOG) injection 0-6 Units (has no administration in time range)    ED Course/ Medical Decision Making/ A&P Clinical Course as of 10/17/21 1724  Wed Oct 17, 2021  1615 CBC(!) Anemia noted hemoglobin stable [JK]  1615 Study consistent with cardiomegaly and vascular congestion [JK]  1635 Discussed with Dr Joelyn Oms.  Pt has been refusing dialysis for an extended period of time.  Will need to start dialysis.  Needs to be admitted to [facility] hospital.  Nephrology will see her in the AM unless any emergent issues arise.  Pt is stable at this time [JK]  1655 Case discussed with Dr. Dwyane Dee, hospitalist  Regarding admission [JK]  5809 Basic metabolic panel(!) Metabolic panel shows worsening renal function.  No hyperkalemia.  Bicarb is normal.  No indications for emergent dialysis [JK]    Clinical Course User Index [JK] Dorie Rank, MD  Medical Decision Making Problems Addressed: Chronic renal failure, unspecified CKD stage: chronic illness or injury with exacerbation, progression, or side effects of treatment Pulmonary vascular congestion: chronic illness or injury with exacerbation, progression, or side effects of treatment  Amount and/or Complexity of Data Reviewed Labs: ordered. Decision-making details documented in ED Course. Radiology: ordered and independent interpretation performed.  Risk Prescription drug management. Decision regarding hospitalization.   Patient presented to the ED for evaluation of edema, weakness, worsening shortness of breath.  Long history of chronic kidney disease.  Dialysis has been recommended in the past but patient was reticent to proceed.  Her symptoms have progressed recently.  She has been in contact with her cardiologist and nephrologist.  Patient was supposed to have an out patient dialysis catheter placed but she felt too weak to proceed with that today.  Patient clearly has signs of edema.  Fortunately she does not have an oxygen requirement.  She does not have any acute electrolyte abnormalities requiring emergent dialysis.  Patient does still urinate.  Given a dose of IV Lasix.  I spoken with nephrology and the hospitalist service.  Patient will be admitted to [facility] with arrangements to have a dialysis catheter placed and initiate dialysis.  Patient is in agreement with this plan        Final Clinical Impression(s) / ED Diagnoses Final diagnoses:  Chronic renal failure, unspecified CKD stage  Pulmonary vascular congestion    Rx / DC Orders ED Discharge Orders     None         Dorie Rank, MD 10/17/21 1724

## 2021-10-17 NOTE — ED Notes (Signed)
[facility] called to arrange transport to [place]

## 2021-10-18 ENCOUNTER — Inpatient Hospital Stay (HOSPITAL_COMMUNITY): Payer: Medicare Other

## 2021-10-18 DIAGNOSIS — J810 Acute pulmonary edema: Secondary | ICD-10-CM | POA: Diagnosis not present

## 2021-10-18 HISTORY — PX: IR US GUIDE VASC ACCESS RIGHT: IMG2390

## 2021-10-18 HISTORY — PX: IR FLUORO GUIDE CV LINE RIGHT: IMG2283

## 2021-10-18 LAB — CBC
HCT: 25.1 % — ABNORMAL LOW (ref 36.0–46.0)
Hemoglobin: 7.9 g/dL — ABNORMAL LOW (ref 12.0–15.0)
MCH: 28.2 pg (ref 26.0–34.0)
MCHC: 31.5 g/dL (ref 30.0–36.0)
MCV: 89.6 fL (ref 80.0–100.0)
Platelets: 83 10*3/uL — ABNORMAL LOW (ref 150–400)
RBC: 2.8 MIL/uL — ABNORMAL LOW (ref 3.87–5.11)
RDW: 18.8 % — ABNORMAL HIGH (ref 11.5–15.5)
WBC: 3.9 10*3/uL — ABNORMAL LOW (ref 4.0–10.5)
nRBC: 0 % (ref 0.0–0.2)

## 2021-10-18 LAB — COMPREHENSIVE METABOLIC PANEL
ALT: 15 U/L (ref 0–44)
AST: 16 U/L (ref 15–41)
Albumin: 3.2 g/dL — ABNORMAL LOW (ref 3.5–5.0)
Alkaline Phosphatase: 47 U/L (ref 38–126)
Anion gap: 13 (ref 5–15)
BUN: 126 mg/dL — ABNORMAL HIGH (ref 8–23)
CO2: 22 mmol/L (ref 22–32)
Calcium: 9.7 mg/dL (ref 8.9–10.3)
Chloride: 110 mmol/L (ref 98–111)
Creatinine, Ser: 6.53 mg/dL — ABNORMAL HIGH (ref 0.44–1.00)
GFR, Estimated: 6 mL/min — ABNORMAL LOW (ref >60)
Glucose, Bld: 217 mg/dL — ABNORMAL HIGH (ref 70–99)
Potassium: 3.7 mmol/L (ref 3.5–5.1)
Sodium: 145 mmol/L (ref 135–145)
Total Bilirubin: 0.6 mg/dL (ref 0.3–1.2)
Total Protein: 6 g/dL — ABNORMAL LOW (ref 6.5–8.1)

## 2021-10-18 LAB — GLUCOSE, CAPILLARY
Glucose-Capillary: 161 mg/dL — ABNORMAL HIGH (ref 70–99)
Glucose-Capillary: 169 mg/dL — ABNORMAL HIGH (ref 70–99)
Glucose-Capillary: 177 mg/dL — ABNORMAL HIGH (ref 70–99)
Glucose-Capillary: 179 mg/dL — ABNORMAL HIGH (ref 70–99)

## 2021-10-18 LAB — MAGNESIUM: Magnesium: 2.2 mg/dL (ref 1.7–2.4)

## 2021-10-18 LAB — HEPATITIS B SURFACE ANTIBODY,QUALITATIVE: Hep B S Ab: NONREACTIVE

## 2021-10-18 LAB — POCT HEMOGLOBIN-HEMACUE: Hemoglobin: 8.4 g/dL — ABNORMAL LOW (ref 12.0–15.0)

## 2021-10-18 LAB — PHOSPHORUS: Phosphorus: 5.6 mg/dL — ABNORMAL HIGH (ref 2.5–4.6)

## 2021-10-18 LAB — HEPATITIS B SURFACE ANTIGEN: Hepatitis B Surface Ag: NONREACTIVE

## 2021-10-18 MED ORDER — FENTANYL CITRATE (PF) 100 MCG/2ML IJ SOLN
INTRAMUSCULAR | Status: AC | PRN
  Administered 2021-10-18 (×2): 25 ug via INTRAVENOUS

## 2021-10-18 MED ORDER — HEPARIN SODIUM (PORCINE) 1000 UNIT/ML IJ SOLN
INTRAMUSCULAR | Status: AC
  Filled 2021-10-18: qty 4

## 2021-10-18 MED ORDER — MIDAZOLAM HCL 2 MG/2ML IJ SOLN
INTRAMUSCULAR | Status: AC
  Filled 2021-10-18: qty 2

## 2021-10-18 MED ORDER — FENTANYL CITRATE (PF) 100 MCG/2ML IJ SOLN
INTRAMUSCULAR | Status: AC
  Filled 2021-10-18: qty 2

## 2021-10-18 MED ORDER — FUROSEMIDE 10 MG/ML IJ SOLN
120.0000 mg | Freq: Two times a day (BID) | INTRAVENOUS | Status: DC
  Administered 2021-10-18: 120 mg via INTRAVENOUS
  Filled 2021-10-18: qty 12
  Filled 2021-10-18: qty 10

## 2021-10-18 MED ORDER — CARVEDILOL 6.25 MG PO TABS
6.2500 mg | ORAL_TABLET | Freq: Two times a day (BID) | ORAL | Status: DC
  Administered 2021-10-18 – 2021-10-25 (×13): 6.25 mg via ORAL
  Filled 2021-10-18 (×14): qty 1

## 2021-10-18 MED ORDER — CHLORHEXIDINE GLUCONATE CLOTH 2 % EX PADS
6.0000 | MEDICATED_PAD | Freq: Every day | CUTANEOUS | Status: DC
  Administered 2021-10-19 – 2021-10-23 (×5): 6 via TOPICAL

## 2021-10-18 MED ORDER — CALCITRIOL 0.5 MCG PO CAPS
0.5000 ug | ORAL_CAPSULE | Freq: Every day | ORAL | Status: DC
  Administered 2021-10-18 – 2021-10-25 (×8): 0.5 ug via ORAL
  Filled 2021-10-18 (×8): qty 1

## 2021-10-18 MED ORDER — CEFAZOLIN SODIUM-DEXTROSE 2-4 GM/100ML-% IV SOLN
INTRAVENOUS | Status: AC
  Filled 2021-10-18: qty 100

## 2021-10-18 MED ORDER — HEPARIN SODIUM (PORCINE) 1000 UNIT/ML IJ SOLN
INTRAMUSCULAR | Status: AC
  Filled 2021-10-18: qty 10

## 2021-10-18 MED ORDER — TRAZODONE HCL 100 MG PO TABS
100.0000 mg | ORAL_TABLET | Freq: Once | ORAL | Status: AC
  Administered 2021-10-18: 100 mg via ORAL
  Filled 2021-10-18: qty 1

## 2021-10-18 MED ORDER — CEFAZOLIN SODIUM-DEXTROSE 2-4 GM/100ML-% IV SOLN
2.0000 g | INTRAVENOUS | Status: AC
  Filled 2021-10-18: qty 100

## 2021-10-18 MED ORDER — LIDOCAINE HCL (PF) 1 % IJ SOLN
INTRAMUSCULAR | Status: AC | PRN
  Administered 2021-10-18: 5 mL

## 2021-10-18 MED ORDER — DARBEPOETIN ALFA 150 MCG/0.3ML IJ SOSY
150.0000 ug | PREFILLED_SYRINGE | INTRAMUSCULAR | Status: DC
  Administered 2021-10-19: 150 ug via INTRAVENOUS
  Filled 2021-10-18: qty 0.3

## 2021-10-18 MED ORDER — MIDAZOLAM HCL 2 MG/2ML IJ SOLN
INTRAMUSCULAR | Status: AC | PRN
  Administered 2021-10-18: .5 mg via INTRAVENOUS

## 2021-10-18 MED ORDER — LIDOCAINE-EPINEPHRINE 1 %-1:100000 IJ SOLN
INTRAMUSCULAR | Status: AC
  Filled 2021-10-18: qty 1

## 2021-10-18 MED ORDER — CEFAZOLIN SODIUM-DEXTROSE 2-4 GM/100ML-% IV SOLN
INTRAVENOUS | Status: AC | PRN
  Administered 2021-10-18: 2 g via INTRAVENOUS

## 2021-10-18 NOTE — Progress Notes (Signed)
PROGRESS NOTE    Tiffany [name]  XVQ:[id number] DOB: 1950/03/17 DOA: 10/17/2021 PCP: Nolene Ebbs, MD  71/F with history of CKD 4/5, chronic diastolic CHF, hypertension, iron deficiency anemia, type 2 diabetes mellitus, OSA, CAD, sciatica presented to the ED with worsening fatigue shortness of breath, has had progressive kidney disease for a few months, recently saw her cardiologist and agreed to start HD, reports orthopnea.  Worsening lethargy after starting Lyrica for her chronic sciatica pain. -In the ED labs noted potassium of 3.9, bicarb 22, creatinine 6.1, BNP 862, hemoglobin 8.8, chest x-ray with cardiomegaly, pulmonary vascular congestion and early interstitial edema  Subjective: -Breathing a little better, reports feeling tired, had been sleeping for last several days  Assessment and Plan:  Pulmonary edema: Acute on chronic diastolic CHF: Progressive CKD 5 -Received '80mg'$  of Lasix in the ED yesterday, 1 L negative -Continue IV Lasix 1 more day, will increase dose -Continue Coreg -Nephrology consulted to start HD   ESRD :  -With some symptoms of uremia -CKD stage IV-V and has been advised hemodialysis in the past. -See discussion above, await nephrology input, n.p.o., will need HD catheter  Metabolic encephalopathy Generalized weakness -Secondary to uremia and decreased renal clearance of Lyrica, now on hold -PT OT to evaluate in 1 to 2 days  Anemia of chronic disease -Iron stores are okay, hemoglobin 7.9 today, likely from CKD and possibly component of hemodilution as well, will benefit from Epogen   Hypothyroidism: Continue levothyroxine 200 mcg daily.   Essential hypertension: -Decrease clonidine and hydralazine dose, continue Coreg   Obesity Diet and exercise discussed in detail   Hyperlipidemia: Continue Lipitor 80 mg daily   Type 2 diabetes: -HbA1c is 6.2 -CBGs in the 1 60-200 range, continue sliding scale today, may need long-acting insulin    Obstructive sleep apnea: Continue CPAP at night.   CAD: Stable.  Continue aspirin, Plavix, Lipitor, Coreg.     DVT prophylaxis: Heparin Code Status: DNR Family Communication: Daughter at bedside  disposition Plan: Home pending stability after starting HD  Consultants: Renal   Procedures:   Antimicrobials:    Objective: Vitals:   10/18/21 0049 10/18/21 0421 10/18/21 0748 10/18/21 0811  BP: 127/64 132/64 125/85 125/85  Pulse: (!) 51 (!) 53 (!) 52 (!) 53  Resp: '20 20 16 12  '$ Temp: 98 F (36.7 C) 98.2 F (36.8 C) 98.3 F (36.8 C) 98.3 F (36.8 C)  TempSrc: Oral Oral Oral Oral  SpO2: 100% 93% 100% 98%  Weight:      Height:        Intake/Output Summary (Last 24 hours) at 10/18/2021 0932 Last data filed at 10/18/2021 6712 Gross per 24 hour  Intake --  Output 1000 ml  Net -1000 ml   Filed Weights   10/17/21 2038  Weight: 117.3 kg    Examination:  General exam: Elderly pleasant female sitting up in bed, AAOx3 HEENT: Positive JVD CVS: S1-S2, regular rhythm Lungs: Few basilar rales Abdomen: Soft, obese, nontender, bowel sounds present Extremities: 1+ edema Skin: No rashes Psychiatry:  Mood & affect appropriate.     Data Reviewed:   CBC: Recent Labs  Lab 10/17/21 1520 10/17/21 1702 10/18/21 0256  WBC 4.8 4.8 3.9*  HGB 8.8* 8.8* 7.9*  HCT 27.6* 28.3* 25.1*  MCV 90.2 89.6 89.6  PLT 103* 109* 83*   Basic Metabolic Panel: Recent Labs  Lab 10/17/21 1520 10/17/21 1702 10/18/21 0256  NA 144  --  145  K 3.8  --  3.7  CL 110  --  110  CO2 22  --  22  GLUCOSE 183*  --  217*  BUN 124*  --  126*  CREATININE 6.12* 5.94* 6.53*  CALCIUM 10.0  --  9.7  MG  --   --  2.2  PHOS  --   --  5.6*   GFR: Estimated Creatinine Clearance: 11 mL/min (A) (by C-G formula based on SCr of 6.53 mg/dL (H)). Liver Function Tests: Recent Labs  Lab 10/18/21 0256  AST 16  ALT 15  ALKPHOS 47  BILITOT 0.6  PROT 6.0*  ALBUMIN 3.2*   No results for input(s):  "LIPASE", "AMYLASE" in the last 168 hours. No results for input(s): "AMMONIA" in the last 168 hours. Coagulation Profile: No results for input(s): "INR", "PROTIME" in the last 168 hours. Cardiac Enzymes: No results for input(s): "CKTOTAL", "CKMB", "CKMBINDEX", "TROPONINI" in the last 168 hours. BNP (last 3 results) No results for input(s): "PROBNP" in the last 8760 hours. HbA1C: Recent Labs    10/17/21 1708  HGBA1C 6.2*   CBG: Recent Labs  Lab 10/17/21 2200 10/18/21 0749  GLUCAP 224* 161*   Lipid Profile: No results for input(s): "CHOL", "HDL", "LDLCALC", "TRIG", "CHOLHDL", "LDLDIRECT" in the last 72 hours. Thyroid Function Tests: No results for input(s): "TSH", "T4TOTAL", "FREET4", "T3FREE", "THYROIDAB" in the last 72 hours. Anemia Panel: No results for input(s): "VITAMINB12", "FOLATE", "FERRITIN", "TIBC", "IRON", "RETICCTPCT" in the last 72 hours. Urine analysis:    Component Value Date/Time   COLORURINE YELLOW 04/17/2020 [place] 04/17/2020 1222   LABSPEC 1.020 12/27/2020 1610   PHURINE 7.0 12/27/2020 1610   GLUCOSEU NEGATIVE 12/27/2020 1610   HGBUR NEGATIVE 12/27/2020 1610   BILIRUBINUR NEGATIVE 12/27/2020 1610   [place] 12/27/2020 1610   PROTEINUR >=300 (A) 12/27/2020 1610   UROBILINOGEN 0.2 12/27/2020 1610   NITRITE NEGATIVE 12/27/2020 1610   LEUKOCYTESUR NEGATIVE 12/27/2020 1610   Sepsis Labs: '@LABRCNTIP'$ (procalcitonin:4,lacticidven:4)  )No results found for this or any previous visit (from the past 240 hour(s)).   Radiology Studies: DG Chest Portable 1 View  Result Date: 10/17/2021 CLINICAL DATA:  Worsening shortness of breath for 5 days. EXAM: PORTABLE CHEST 1 VIEW COMPARISON:  Chest radiograph 05/12/2021 FINDINGS: The cardiac silhouette remains enlarged. Aortic atherosclerosis is noted. Compared to the prior study, there is increased pulmonary vascular congestion with mild prominence of the interstitial markings. No airspace  consolidation, sizeable pleural effusion, or pneumothorax is identified. No acute osseous abnormality is seen. IMPRESSION: Cardiomegaly with increased pulmonary vascular congestion/early interstitial edema. Electronically Signed   By: Logan Bores M.D.   On: 10/17/2021 16:17     Scheduled Meds:  amLODipine  10 mg Oral BID   aspirin EC  81 mg Oral QPM   atorvastatin  80 mg Oral QHS   carvedilol  6.25 mg Oral BID WC   clopidogrel  75 mg Oral Daily   docusate sodium  100 mg Oral BID   furosemide  40 mg Intravenous Q12H   heparin  5,000 Units Subcutaneous Q8H   insulin aspart  0-6 Units Subcutaneous TID WC   levothyroxine  200 mcg Oral q morning   senna  1 tablet Oral BID   Continuous Infusions:   LOS: 1 day    Time spent: 1mn  PDomenic Polite MD [facility]   10/18/2021, 9:52 AM

## 2021-10-18 NOTE — Progress Notes (Signed)
Requested to see pt for out-pt HD needs at d/c. Unable to meet with pt this afternoon due to pt being in a procedure. Will attempt to meet with pt tomorrow morning.   Melven Sartorius Renal Navigator [phone]

## 2021-10-18 NOTE — Consult Note (Signed)
Nephrology Consult   Requesting provider: Domenic Polite Service requesting consult: Hospitalist Reason for consult: ESRD   Assessment/Recommendations: Tiffany [name] is a/an 71 y.o. 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  -Avoid nephrotoxic medications including NSAIDs -Use synthetic opioids (Fentanyl/Dilaudid) if needed -Start CLIP process, likely good TCU candidate given desire for PD. Hold on AVF for now; discussed with Dr. Johnney Ou  Volume Status: Appears volume overloaded.  We will give IV diuretics as well as volume removal with dialysis  Hypertension: Continue current medications as prescribed  Anemia of CKD: On retacrit every 3 weeks outpatient. Hgb low. Recent iron studies acceptable. Start aranesp here.  Secondary hyperparathyroidism: Phosphorus mildly elevated.  Continue home calcitriol.  Uncontrolled Diabetes Mellitus Type 2 with Hyperglycemia: Management per primary   Recommendations conveyed to primary service.    [facility] 10/18/2021 1:30 PM   _____________________________________________________________________________________ CC: shortness of breath  History of Present Illness: Tiffany [name] is a/an 71 y.o. female with a past medical history of CKD V, CHF, HTN, HLD, anemia, DM2 who presents with weakness and shortness of breath.  The patient has chronic kidney disease and is followed by Dr. Johnney Ou outpatient.  There have been plans trying to get her ready for dialysis.  The ultimate plan was peritoneal dialysis but  because of quickly worsening issues with uremia and volume plan shifted to starting with hemodialysis and then transitioning at some point.  The patient states that for the past several weeks she has had worsening lower extremity edema.  She states that she thinks Lyrica had something to do with that and that her legs got very heavy and was unable to ambulate easily.  She also admits to worsening fatigue and shortness of breath.  She followed up with cardiology recently and because of her symptoms it was recommended that she proceed with hemodialysis.  She does have orthopnea as well as some nausea but not a lot of vomiting.  In the emergency department she was hemodynamically stable but did have some bradycardia.  Labs demonstrated creatinine of 6 and BUN of 124.  Chest x-ray demonstrated some volume overload.  Patient states her shortness of breath is somewhat improved today but she does understand she needs to proceed with dialysis.   Medications:  Current Facility-Administered Medications  Medication Dose Route Frequency Provider Last Rate Last Admin   acetaminophen (TYLENOL) tablet 650 mg  650 mg Oral Q6H PRN Shawna Clamp, MD       Or   acetaminophen (TYLENOL) suppository 650 mg  650 mg Rectal Q6H PRN Shawna Clamp, MD       albuterol (PROVENTIL) (2.5 MG/3ML) 0.083% nebulizer solution 3 mL  3 mL Inhalation Q6H PRN Shawna Clamp, MD       amLODipine (NORVASC) tablet 10 mg  10 mg Oral BID Shawna Clamp, MD   10 mg at 10/18/21 1129   aspirin EC tablet 81 mg  81 mg Oral QPM Shawna Clamp, MD   81 mg at 10/17/21 2112   atorvastatin (LIPITOR) tablet 80 mg  80 mg Oral QHS Shawna Clamp,  MD   80 mg at 10/17/21 2110   carvedilol (COREG) tablet 6.25 mg  6.25 mg Oral BID WC Domenic Polite, MD   6.25 mg at 10/18/21 1133   ceFAZolin (ANCEF) IVPB 2g/100 mL premix  2 g Intravenous to XRAY Ascencion Dike, PA-C       clopidogrel (PLAVIX) tablet 75 mg  75 mg Oral Daily Shawna Clamp, MD   75 mg at  10/18/21 1130   docusate sodium (COLACE) capsule 100 mg  100 mg Oral BID Shawna Clamp, MD   100 mg at 10/18/21 1130   furosemide (LASIX) injection 40 mg  40 mg Intravenous Q12H Shawna Clamp, MD   40 mg at 10/18/21 1130   heparin injection 5,000 Units  5,000 Units Subcutaneous Q8H Shawna Clamp, MD   5,000 Units at 10/18/21 0534   insulin aspart (novoLOG) injection 0-6 Units  0-6 Units Subcutaneous TID WC Shawna Clamp, MD       levothyroxine (SYNTHROID) tablet 200 mcg  200 mcg Oral q morning Shawna Clamp, MD   200 mcg at 10/18/21 0531   ondansetron (ZOFRAN) tablet 4 mg  4 mg Oral Q6H PRN Shawna Clamp, MD       Or   ondansetron [facility]) injection 4 mg  4 mg Intravenous Q6H PRN Shawna Clamp, MD       senna (SENOKOT) tablet 8.6 mg  1 tablet Oral BID Shawna Clamp, MD   8.6 mg at 10/18/21 1130     ALLERGIES Other and Sulfa antibiotics  MEDICAL HISTORY Past Medical History:  Diagnosis Date   ALLERGIC RHINITIS    Arthritis    CHF (congestive heart failure) (HCC)    CKD (chronic kidney disease)    COVID 01/21/2021   + HOME TEST  HAD COUGH CONGESTION AND MALAISE, OCC CONGESTION NOW   Heart murmur    at birth   Hyperlipidemia    Hypertension    Hypothyroid    Iron deficiency anemia    Obesity    OSA on CPAP    Refusal of blood transfusions as patient is Jehovah's Witness    Shingles 11/2020   LEFT BACK   Type 2 dm with insulin use    checks abg qday runs 125   Wears glasses      SOCIAL HISTORY Social History   Socioeconomic History   Marital status: Single    Spouse name: Not on file   Number of children: Not on file   Years of education: Not on file   Highest education level: Not on file  Occupational History   Occupation: school bus driver  Tobacco Use   Smoking status: Never   Smokeless tobacco: Never  Vaping Use   Vaping Use: Never used  Substance and Sexual Activity   Alcohol use: Not Currently   Drug use: No   Sexual activity: Not Currently     Birth control/protection: Surgical  Other Topics Concern   Not on file  Social History Narrative   Not on file   Social Determinants of Health   Financial Resource Strain: Not on file  Food Insecurity: No Food Insecurity (10/18/2021)   Hunger Vital Sign    Worried About Running Out of Food in the Last Year: Never true    Ran Out of Food in the Last Year: Never true  Transportation Needs: No Transportation Needs (10/18/2021)   PRAPARE - Hydrologist (Medical): No    Lack of Transportation (Non-Medical): No  Physical Activity:  Not on file  Stress: Not on file  Social Connections: Not on file  Intimate Partner Violence: Not At Risk (10/18/2021)   Humiliation, Afraid, Rape, and Kick questionnaire    Fear of Current or Ex-Partner: No    Emotionally Abused: No    Physically Abused: No    Sexually Abused: No     FAMILY HISTORY Family History  Problem Relation Age of Onset   Prostate cancer Father    Congestive Heart Failure Mother    Diabetes Sister    Other Sister        septis   Breast cancer Neg Hx       Review of Systems: 12 systems reviewed Otherwise as per HPI, all other systems reviewed and negative  Physical Exam: Vitals:   10/18/21 0811 10/18/21 1129  BP: 125/85 (!) 144/72  Pulse: (!) 53 (!) 52  Resp: 12 11  Temp: 98.3 F (36.8 C) 98 F (36.7 C)  SpO2: 98%    Total I/O In: -  Out: 400 [Urine:400]  Intake/Output Summary (Last 24 hours) at 10/18/2021 1330 Last data filed at 10/18/2021 1057 Gross per 24 hour  Intake --  Output 1400 ml  Net -1400 ml   General: well-appearing, no acute distress HEENT: anicteric sclera, oropharynx clear without lesions CV: bradycardia, no rubs, 2+ edema in the BLE Lungs: clear to auscultation anteriorly, mild crackles at the bases, no iwob Abd: soft, non-tender, non-distended Skin: no visible lesions or rashes Psych: alert, engaged, appropriate mood and affect Musculoskeletal: no obvious  deformities Neuro: normal speech, no gross focal deficits   Test Results Reviewed Lab Results  Component Value Date   NA 145 10/18/2021   K 3.7 10/18/2021   CL 110 10/18/2021   CO2 22 10/18/2021   BUN 126 (H) 10/18/2021   CREATININE 6.53 (H) 10/18/2021   CALCIUM 9.7 10/18/2021   ALBUMIN 3.2 (L) 10/18/2021   PHOS 5.6 (H) 10/18/2021    CBC Recent Labs  Lab 10/17/21 1520 10/17/21 1702 10/18/21 0256  WBC 4.8 4.8 3.9*  HGB 8.8* 8.8* 7.9*  HCT 27.6* 28.3* 25.1*  MCV 90.2 89.6 89.6  PLT 103* 109* 83*    I have reviewed all relevant outside healthcare records related to the patient's current hospitalization

## 2021-10-18 NOTE — Progress Notes (Signed)
Chief Complaint: Patient was seen in consultation today for dialysis catheter   Referring Physician(s): Dr. Joylene Grapes  Supervising Physician: Jacqulynn Cadet  Patient Status: [facility] - In-pt  History of Present Illness: Tiffany [name] is a 71 y.o. female with progressive CKD now in need of dialysis. She is agreeable to this and IR has been consulted to place durable HD access. PMHx, meds, labs, imaging, allergies reviewed. Feels well, no recent fevers, chills, illness. Has been NPO today as directed. Family at bedside.   Past Medical History:  Diagnosis Date   ALLERGIC RHINITIS    Arthritis    CHF (congestive heart failure) (HCC)    CKD (chronic kidney disease)    COVID 01/21/2021   + HOME TEST  HAD COUGH CONGESTION AND MALAISE, OCC CONGESTION NOW   Heart murmur    at birth   Hyperlipidemia    Hypertension    Hypothyroid    Iron deficiency anemia    Obesity    OSA on CPAP    Refusal of blood transfusions as patient is Jehovah's Witness    Shingles 11/2020   LEFT BACK   Type 2 dm with insulin use    checks abg qday runs 125   Wears glasses     Past Surgical History:  Procedure Laterality Date   BREAST BIOPSY     left breast per pt; benign 20+ yrs ago   [place]   COLONOSCOPY WITH PROPOFOL N/A 10/24/2020   Procedure: COLONOSCOPY WITH PROPOFOL;  Surgeon: Ronnette Juniper, MD;  Location: [facility];  Service: Gastroenterology;  Laterality: N/A;   CORONARY STENT INTERVENTION N/A 05/12/2021   Procedure: CORONARY STENT INTERVENTION;  Surgeon: Early Osmond, MD;  Location: [place] [facility];  Service: Cardiovascular;  Laterality: N/A;   CORONARY/GRAFT ACUTE MI REVASCULARIZATION N/A 05/12/2021   Procedure: Coronary/Graft Acute MI Revascularization;  Surgeon: Early Osmond, MD;  Location: [place] [facility];  Service: Cardiovascular;  Laterality: N/A;   KNEE SURGERY Bilateral    x 2   LEFT HEART CATH AND CORONARY ANGIOGRAPHY N/A 05/12/2021    Procedure: LEFT HEART CATH AND CORONARY ANGIOGRAPHY;  Surgeon: Early Osmond, MD;  Location: [place] [facility];  Service: Cardiovascular;  Laterality: N/A;   LUMBAR DISC SURGERY     x 2   POLYPECTOMY  10/24/2020   Procedure: POLYPECTOMY;  Surgeon: Ronnette Juniper, MD;  Location: [facility];  Service: Gastroenterology;;   REDUCTION MAMMAPLASTY Bilateral 1985   TOE SURGERY     yrs ago per pt on 02-02-2021   TOTAL ABDOMINAL HYSTERECTOMY  1997   partial hysterectomy- still has ovaries   TOTAL KNEE ARTHROPLASTY Left 07/01/2019   Procedure: TOTAL KNEE ARTHROPLASTY;  Surgeon: Paralee Cancel, MD;  Location: [facility];  Service: Orthopedics;  Laterality: Left;  70 min NO BLOOD PRODUCTS!!   TREATMENT FISTULA ANAL     yrs ago per pt 0n 02-02-2021   TRIGGER FINGER RELEASE Left 02/08/2021   Procedure: Left Ring trigger finger release, left ring finger volar ganglion cyst excision;  Surgeon: Orene Desanctis, MD;  Location: [facility];  Service: Orthopedics;  Laterality: Left;  with local anesthesia    Allergies: Other and Sulfa antibiotics  Medications:  Current Facility-Administered Medications:    acetaminophen (TYLENOL) tablet 650 mg, 650 mg, Oral, Q6H PRN **OR** acetaminophen (TYLENOL) suppository 650 mg, 650 mg, Rectal, Q6H PRN, Shawna Clamp, MD   albuterol (PROVENTIL) (2.5 MG/3ML) 0.083% nebulizer solution 3 mL, 3 mL,  Inhalation, Q6H PRN, Shawna Clamp, MD   amLODipine (NORVASC) tablet 10 mg, 10 mg, Oral, BID, Shawna Clamp, MD, 10 mg at 10/18/21 1129   aspirin EC tablet 81 mg, 81 mg, Oral, QPM, Shawna Clamp, MD, 81 mg at 10/17/21 2112   atorvastatin (LIPITOR) tablet 80 mg, 80 mg, Oral, QHS, Shawna Clamp, MD, 80 mg at 10/17/21 2110   carvedilol (COREG) tablet 6.25 mg, 6.25 mg, Oral, BID WC, Domenic Polite, MD, 6.25 mg at 10/18/21 1133   clopidogrel (PLAVIX) tablet 75 mg, 75 mg, Oral, Daily, Shawna Clamp, MD, 75 mg at 10/18/21 1130   docusate sodium (COLACE) capsule 100  mg, 100 mg, Oral, BID, Shawna Clamp, MD, 100 mg at 10/18/21 1130   furosemide (LASIX) injection 40 mg, 40 mg, Intravenous, Q12H, Shawna Clamp, MD, 40 mg at 10/18/21 1130   heparin injection 5,000 Units, 5,000 Units, Subcutaneous, Q8H, Shawna Clamp, MD, 5,000 Units at 10/18/21 0534   insulin aspart (novoLOG) injection 0-6 Units, 0-6 Units, Subcutaneous, TID WC, Shawna Clamp, MD   levothyroxine (SYNTHROID) tablet 200 mcg, 200 mcg, Oral, q morning, Shawna Clamp, MD, 200 mcg at 10/18/21 0531   ondansetron (ZOFRAN) tablet 4 mg, 4 mg, Oral, Q6H PRN **OR** ondansetron (ZOFRAN) injection 4 mg, 4 mg, Intravenous, Q6H PRN, Shawna Clamp, MD   senna (SENOKOT) tablet 8.6 mg, 1 tablet, Oral, BID, Shawna Clamp, MD, 8.6 mg at 10/18/21 1130    Family History  Problem Relation Age of Onset   Prostate cancer Father    Congestive Heart Failure Mother    Diabetes Sister    Other Sister        septis   Breast cancer Neg Hx     Social History   Socioeconomic History   Marital status: Single    Spouse name: Not on file   Number of children: Not on file   Years of education: Not on file   Highest education level: Not on file  Occupational History   Occupation: school bus driver  Tobacco Use   Smoking status: Never   Smokeless tobacco: Never  Vaping Use   Vaping Use: Never used  Substance and Sexual Activity   Alcohol use: Not Currently   Drug use: No   Sexual activity: Not Currently    Birth control/protection: Surgical  Other Topics Concern   Not on file  Social History Narrative   Not on file   Social Determinants of Health   Financial Resource Strain: Not on file  Food Insecurity: No Food Insecurity (10/18/2021)   Hunger Vital Sign    Worried About Running Out of Food in the Last Year: Never true    Ran Out of Food in the Last Year: Never true  Transportation Needs: No Transportation Needs (10/18/2021)   PRAPARE - Hydrologist (Medical): No     Lack of Transportation (Non-Medical): No  Physical Activity: Not on file  Stress: Not on file  Social Connections: Not on file    Review of Systems: A 12 point ROS discussed and pertinent positives are indicated in the HPI above.  All other systems are negative.  Review of Systems  Vital Signs: BP (!) 144/72 (BP Location: Left Arm)   Pulse (!) 52   Temp 98 F (36.7 C) (Oral)   Resp 11   Ht '5\' 9"'$  (1.753 m)   Wt 258 lb 9.6 oz (117.3 kg)   SpO2 98%   BMI 38.19 kg/m   Physical Exam Constitutional:  Appearance: Normal appearance. She is well-developed.  HENT:     Mouth/Throat:     Mouth: Mucous membranes are moist.     Pharynx: Oropharynx is clear.  Cardiovascular:     Rate and Rhythm: Normal rate and regular rhythm.     Heart sounds: Normal heart sounds.  Pulmonary:     Effort: Pulmonary effort is normal. No respiratory distress.     Breath sounds: Normal breath sounds.  Neurological:     General: No focal deficit present.     Mental Status: She is alert and oriented to person, place, and time.  Psychiatric:        Mood and Affect: Mood normal.        Thought Content: Thought content normal.     Imaging: DG Chest Portable 1 View  Result Date: 10/17/2021 CLINICAL DATA:  Worsening shortness of breath for 5 days. EXAM: PORTABLE CHEST 1 VIEW COMPARISON:  Chest radiograph 05/12/2021 FINDINGS: The cardiac silhouette remains enlarged. Aortic atherosclerosis is noted. Compared to the prior study, there is increased pulmonary vascular congestion with mild prominence of the interstitial markings. No airspace consolidation, sizeable pleural effusion, or pneumothorax is identified. No acute osseous abnormality is seen. IMPRESSION: Cardiomegaly with increased pulmonary vascular congestion/early interstitial edema. Electronically Signed   By: Logan Bores M.D.   On: 10/17/2021 16:17    Labs:  CBC: Recent Labs    05/28/21 1000 06/19/21 1106 09/11/21 1102 10/17/21 1520  10/17/21 1702 10/18/21 0256  WBC 4.8  --   --  4.8 4.8 3.9*  HGB 9.4*   < > 8.8* 8.8* 8.8* 7.9*  HCT 31.0*  --   --  27.6* 28.3* 25.1*  PLT 178  --   --  103* 109* 83*   < > = values in this interval not displayed.    COAGS: Recent Labs    05/11/21 2339  INR 1.1  APTT 34    BMP: Recent Labs    09/11/21 1105 10/09/21 1052 10/17/21 1520 10/17/21 1702 10/18/21 0256  NA 143 142 144  --  145  K 4.0 3.9 3.8  --  3.7  CL 110 109 110  --  110  CO2 22 19* 22  --  22  GLUCOSE 142* 156* 183*  --  217*  BUN 102* 128* 124*  --  126*  CALCIUM 10.2  10.0 10.0  10.1 10.0  --  9.7  CREATININE 5.27* 5.99* 6.12* 5.94* 6.53*  GFRNONAA 8* 7* 7* 7* 6*    LIVER FUNCTION TESTS: Recent Labs    05/12/21 0940 05/19/21 0658 05/28/21 1000 06/19/21 1106 08/14/21 1100 09/11/21 1105 10/09/21 1052 10/18/21 0256  BILITOT 0.7  --  0.4  --   --   --   --  0.6  AST 46*  --  17  --   --   --   --  16  ALT 25  --  14  --   --   --   --  15  ALKPHOS 65  --   --   --   --   --   --  47  PROT 5.9*  --  6.6  --   --   --   --  6.0*  ALBUMIN 3.1*   < >  --    < > 3.6 3.5 3.5 3.2*   < > = values in this interval not displayed.     Assessment and Plan: Progressive CKD in need of dialysis.  Plan for tunneled hemodialysis catheter. Labs reviewed. Pt has been NPO. Risks and benefits of image guided tunneled hemodialysis placement was discussed with the patient including, but not limited to bleeding, infection, pneumothorax, or fibrin sheath development and need for additional procedures.  All of the patient's questions were answered, patient is agreeable to proceed. Consent signed and in chart.   Electronically Signed: Ascencion Dike, PA-C 10/18/2021, 12:03 PM   I spent a total of 20 in face to face in clinical consultation, greater than 50% of which was counseling/coordinating care for [facility]

## 2021-10-18 NOTE — Progress Notes (Signed)
Received patient in bed to unit.  Alert and oriented.  Informed consent signed and in chart.   Treatment initiated: 1928 Treatment completed: 2127  Patient tolerated well.  Transported back to the room  Alert, without acute distress.  Hand-off given to patient's nurse.   Access used: catheter Access issues: none  Total UF removed: 500 ml Medication(s) given: none Post HD VS: 98.1 149/71 52 14 96%  Post HD weight: 114.8kg   [facility]

## 2021-10-19 ENCOUNTER — Inpatient Hospital Stay (HOSPITAL_COMMUNITY): Payer: Medicare Other

## 2021-10-19 DIAGNOSIS — J810 Acute pulmonary edema: Secondary | ICD-10-CM | POA: Diagnosis not present

## 2021-10-19 LAB — CBC
HCT: 26.4 % — ABNORMAL LOW (ref 36.0–46.0)
Hemoglobin: 8.3 g/dL — ABNORMAL LOW (ref 12.0–15.0)
MCH: 27.8 pg (ref 26.0–34.0)
MCHC: 31.4 g/dL (ref 30.0–36.0)
MCV: 88.3 fL (ref 80.0–100.0)
Platelets: 92 10*3/uL — ABNORMAL LOW (ref 150–400)
RBC: 2.99 MIL/uL — ABNORMAL LOW (ref 3.87–5.11)
RDW: 19.1 % — ABNORMAL HIGH (ref 11.5–15.5)
WBC: 5.5 10*3/uL (ref 4.0–10.5)
nRBC: 0 % (ref 0.0–0.2)

## 2021-10-19 LAB — BASIC METABOLIC PANEL
Anion gap: 12 (ref 5–15)
BUN: 101 mg/dL — ABNORMAL HIGH (ref 8–23)
CO2: 23 mmol/L (ref 22–32)
Calcium: 9.5 mg/dL (ref 8.9–10.3)
Chloride: 106 mmol/L (ref 98–111)
Creatinine, Ser: 5.54 mg/dL — ABNORMAL HIGH (ref 0.44–1.00)
GFR, Estimated: 8 mL/min — ABNORMAL LOW (ref >60)
Glucose, Bld: 175 mg/dL — ABNORMAL HIGH (ref 70–99)
Potassium: 3.4 mmol/L — ABNORMAL LOW (ref 3.5–5.1)
Sodium: 141 mmol/L (ref 135–145)

## 2021-10-19 LAB — GLUCOSE, CAPILLARY
Glucose-Capillary: 168 mg/dL — ABNORMAL HIGH (ref 70–99)
Glucose-Capillary: 128 mg/dL — ABNORMAL HIGH (ref 70–99)
Glucose-Capillary: 164 mg/dL — ABNORMAL HIGH (ref 70–99)

## 2021-10-19 LAB — HEPATITIS B SURFACE ANTIBODY, QUANTITATIVE: Hep B S AB Quant (Post): <3.1 m[IU]/mL — ABNORMAL LOW (ref >9.9)

## 2021-10-19 MED ORDER — HEPARIN SODIUM (PORCINE) 1000 UNIT/ML IJ SOLN
INTRAMUSCULAR | Status: AC
  Filled 2021-10-19: qty 1

## 2021-10-19 MED ORDER — FUROSEMIDE 10 MG/ML IJ SOLN
120.0000 mg | Freq: Two times a day (BID) | INTRAVENOUS | Status: DC
  Administered 2021-10-19 (×2): 120 mg via INTRAVENOUS
  Filled 2021-10-19 (×2): qty 10
  Filled 2021-10-19 (×2): qty 12

## 2021-10-19 MED ORDER — TRAZODONE HCL 50 MG PO TABS
50.0000 mg | ORAL_TABLET | Freq: Once | ORAL | Status: AC
  Administered 2021-10-19: 50 mg via ORAL
  Filled 2021-10-19: qty 1

## 2021-10-19 MED ORDER — AMLODIPINE BESYLATE 10 MG PO TABS
10.0000 mg | ORAL_TABLET | Freq: Every day | ORAL | Status: DC
  Administered 2021-10-19 – 2021-10-25 (×7): 10 mg via ORAL
  Filled 2021-10-19 (×7): qty 1

## 2021-10-19 NOTE — Progress Notes (Signed)
Nephrology Follow-Up Consult note   Assessment/Recommendations: Tiffany [name] is a/an 71 y.o. 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 Appreciate help -2/3 HD today and again tomorrow -Volume removal as tolerated with hemodialysis -Continue to monitor daily Cr, Dose meds for GFR -Monitor Daily I/Os, Daily weight  -Maintain MAP>65 for optimal renal perfusion.  -Avoid nephrotoxic medications including NSAIDs -Use synthetic opioids (Fentanyl/Dilaudid) if needed -Start CLIP process, likely good TCU candidate given desire for PD. Hold on AVF for now; discussed with Dr. Johnney Ou   Volume Status: Appears volume overloaded.  Continue with UF on HD.   Hypertension: Continue current medications as prescribed   Anemia of CKD: On retacrit every 3 weeks outpatient. Hgb low. Recent iron studies acceptable. Started aranesp here   Secondary hyperparathyroidism: Phosphorus mildly elevated.  Continue home calcitriol.   Uncontrolled Diabetes Mellitus Type 2 with Hyperglycemia: Management per primary   Recommendations conveyed to primary service.    [facility] 10/19/2021 11:20 AM  ___________________________________________________________  CC: ESRD  Interval History/Subjective: Patient states she is starting feel better today.  Edema slightly improved.  Shortness of breath also slightly improved.  Tolerated dialysis yesterday with no issues.  Is having some bleeding from her catheter insertion site.   Medications:  Current Facility-Administered Medications  Medication Dose Route Frequency Provider Last Rate Last Admin   acetaminophen (TYLENOL) tablet 650 mg  650 mg Oral Q6H PRN Shawna Clamp, MD   650 mg at 10/19/21 0856   Or   acetaminophen (TYLENOL)  suppository 650 mg  650 mg Rectal Q6H PRN Shawna Clamp, MD       albuterol (PROVENTIL) (2.5 MG/3ML) 0.083% nebulizer solution 3 mL  3 mL Inhalation Q6H PRN Shawna Clamp, MD       amLODipine (NORVASC) tablet 10 mg  10 mg Oral Daily Domenic Polite, MD       aspirin EC tablet 81 mg  81 mg Oral QPM Shawna Clamp, MD   81 mg at 10/18/21 1634   atorvastatin (LIPITOR) tablet 80 mg  80 mg Oral QHS Shawna Clamp, MD   80 mg at 10/18/21 2300   calcitRIOL (ROCALTROL) capsule 0.5 mcg  0.5 mcg Oral Daily Reesa Chew, MD   0.5 mcg at 10/18/21 1634   carvedilol (COREG) tablet 6.25 mg  6.25 mg Oral BID WC Domenic Polite, MD   6.25 mg at 10/18/21 1634   Chlorhexidine Gluconate Cloth 2 % PADS 6 each  6 each Topical Q0600 Reesa Chew, MD   6 each at 10/19/21 0617   clopidogrel (PLAVIX) tablet 75 mg  75 mg Oral Daily Shawna Clamp, MD   75 mg at 10/18/21 1130   Darbepoetin Alfa (ARANESP) injection 150 mcg  150 mcg Intravenous Q Fri-HD Reesa Chew, MD       docusate sodium (COLACE) capsule 100 mg  100 mg Oral BID Shawna Clamp, MD   100 mg at 10/18/21 1130   furosemide (LASIX) 120 mg in dextrose 5 % 50 mL IVPB  120 mg Intravenous Q12H Reesa Chew, MD 62 mL/hr at 10/18/21 2347 120 mg at 10/18/21 2347   heparin injection 5,000 Units  5,000 Units Subcutaneous Q8H Shawna Clamp, MD   5,000 Units at 10/19/21 0617   insulin  aspart (novoLOG) injection 0-6 Units  0-6 Units Subcutaneous TID WC Shawna Clamp, MD   1 Units at 10/19/21 0854   levothyroxine (SYNTHROID) tablet 200 mcg  200 mcg Oral q morning Shawna Clamp, MD   200 mcg at 10/19/21 0617   ondansetron (ZOFRAN) tablet 4 mg  4 mg Oral Q6H PRN Shawna Clamp, MD       Or   ondansetron [facility]) injection 4 mg  4 mg Intravenous Q6H PRN Shawna Clamp, MD       senna (SENOKOT) tablet 8.6 mg  1 tablet Oral BID Shawna Clamp, MD   8.6 mg at 10/18/21 1130      Review of Systems: 10 systems reviewed and negative except per interval  history/subjective  Physical Exam: Vitals:   10/18/21 2200 10/19/21 0430  BP: 138/74 (!) 148/74  Pulse: (!) 58 (!) 51  Resp: (!) 24 20  Temp: 98.1 F (36.7 C) 97.9 F (36.6 C)  SpO2: 94% 95%   No intake/output data recorded.  Intake/Output Summary (Last 24 hours) at 10/19/2021 1120 Last data filed at 10/19/2021 0700 Gross per 24 hour  Intake 402.82 ml  Output 1800 ml  Net -1397.18 ml   Constitutional: well-appearing, no acute distress ENMT: ears and nose without scars or lesions, MMM CV: Bradycardia cardia, 1+ pitting edema in the bilateral lower extremities Respiratory: Bilateral chest rise, normal work of breathing Gastrointestinal: soft, non-tender, no palpable masses or hernias Skin: no visible lesions or rashes Psych: alert, judgement/insight appropriate, appropriate mood and affect   Test Results I personally reviewed new and old clinical labs and radiology tests Lab Results  Component Value Date   NA 141 10/19/2021   K 3.4 (L) 10/19/2021   CL 106 10/19/2021   CO2 23 10/19/2021   BUN 101 (H) 10/19/2021   CREATININE 5.54 (H) 10/19/2021   CALCIUM 9.5 10/19/2021   ALBUMIN 3.2 (L) 10/18/2021   PHOS 5.6 (H) 10/18/2021    CBC Recent Labs  Lab 10/17/21 1702 10/18/21 0256 10/19/21 0956  WBC 4.8 3.9* 5.5  HGB 8.8* 7.9* 8.3*  HCT 28.3* 25.1* 26.4*  MCV 89.6 89.6 88.3  PLT 109* 83* 92*

## 2021-10-19 NOTE — Progress Notes (Signed)
PROGRESS NOTE    Tiffany [name]  TDD:[id number] DOB: [birth year] DOA: 10/17/2021 PCP: Nolene Ebbs, MD  71/F with history of CKD 4/5, chronic diastolic CHF, hypertension, iron deficiency anemia, type 2 diabetes mellitus, OSA, CAD, sciatica presented to the ED with worsening fatigue shortness of breath, has had progressive kidney disease for a few months, recently saw her cardiologist and agreed to start HD, reports orthopnea.  Worsening lethargy after starting Lyrica for her chronic sciatica pain. -In the ED labs noted potassium of 3.9, bicarb 22, creatinine 6.1, BNP 862, hemoglobin 8.8, chest x-ray with cardiomegaly, pulmonary vascular congestion and early interstitial edema -Right IJ HD cath placed 9/14 and started hemodialysis  Subjective: -Feels better, breathing is improving  Assessment and Plan:  Pulmonary edema: Acute on chronic diastolic CHF: Progressive CKD 5 -Remains on IV Lasix, she is 2 L negative -HD cath placed yesterday, started HD -Continue Coreg   ESRD :  -With some symptoms of uremia -CKD stage IV-V and has been advised hemodialysis in the past. -HD catheter placed, started hemodialysis yesterday, plan for repeat HD today, had some bleeding from around the catheter site overnight, hold heparin today  Metabolic encephalopathy Generalized weakness -Secondary to uremia and decreased renal clearance of Lyrica, now on hold -PT OT to evaluate   Anemia of chronic disease -Iron stores are okay, hemoglobin 7.9 today, likely from CKD and possibly component of hemodilution as well, will benefit from Epogen   Hypothyroidism: Continue levothyroxine 200 mcg daily.   Essential hypertension: -Decrease clonidine and hydralazine dose, continue Coreg   Obesity Diet and exercise discussed in detail   Hyperlipidemia: Continue Lipitor 80 mg daily   Type 2 diabetes: -HbA1c is 6.2 -CBGs in the 1 60-200 range, continue sliding scale today, may need long-acting insulin    Obstructive sleep apnea: Continue CPAP at night.   CAD: Stable.  Continue aspirin, Plavix, Lipitor, Coreg.     DVT prophylaxis: Hold heparin subcu Code Status: DNR Family Communication: Daughter at bedside  disposition Plan: Home pending stability after starting HD  Consultants: Renal   Procedures:   Antimicrobials:    Objective: Vitals:   10/18/21 2130 10/18/21 2200 10/19/21 0430 10/19/21 1136  BP: (!) 155/70 138/74 (!) 148/74 (!) 173/76  Pulse: (!) 54 (!) 58 (!) 51 (!) 19  Resp: (!) 24 (!) 24 20 13   Temp:  98.1 F (36.7 C) 97.9 F (36.6 C) 98 F (36.7 C)  TempSrc:  Oral Oral Oral  SpO2: 94% 94% 95% 96%  Weight: 114.8 kg     Height:        Intake/Output Summary (Last 24 hours) at 10/19/2021 1348 Last data filed at 10/19/2021 0700 Gross per 24 hour  Intake 402.82 ml  Output 1800 ml  Net -1397.18 ml   Filed Weights   10/17/21 2038 10/18/21 1910 10/18/21 2130  Weight: 117.3 kg 115.6 kg 114.8 kg    Examination:  General exam: elderly pleasant female sitting up in bed, AAOx3, no distress HEENT: Positive JVD, right IJ HD catheter with some bleeding CVS: S1-S2, regular rhythm Lungs few basilar rales, improving Abdomen: Soft, nontender, bowel sounds present Extremities: 1+ edema Skin: No rashes Psychiatry:  Mood & affect appropriate.     Data Reviewed:   CBC: Recent Labs  Lab 10/17/21 1520 10/17/21 1702 10/18/21 0256 10/19/21 0956  WBC 4.8 4.8 3.9* 5.5  HGB 8.8* 8.8* 7.9* 8.3*  HCT 27.6* 28.3* 25.1* 26.4*  MCV 90.2 89.6 89.6 88.3  PLT 103* 109* 83* 92*  Basic Metabolic Panel: Recent Labs  Lab 10/17/21 1520 10/17/21 1702 10/18/21 0256 10/19/21 0956  NA 144  --  145 141  K 3.8  --  3.7 3.4*  CL 110  --  110 106  CO2 22  --  22 23  GLUCOSE 183*  --  217* 175*  BUN 124*  --  126* 101*  CREATININE 6.12* 5.94* 6.53* 5.54*  CALCIUM 10.0  --  9.7 9.5  MG  --   --  2.2  --   PHOS  --   --  5.6*  --    GFR: Estimated Creatinine Clearance:  12.8 mL/min (A) (by C-G formula based on SCr of 5.54 mg/dL (H)). Liver Function Tests: Recent Labs  Lab 10/18/21 0256  AST 16  ALT 15  ALKPHOS 47  BILITOT 0.6  PROT 6.0*  ALBUMIN 3.2*   No results for input(s): "LIPASE", "AMYLASE" in the last 168 hours. No results for input(s): "AMMONIA" in the last 168 hours. Coagulation Profile: No results for input(s): "INR", "PROTIME" in the last 168 hours. Cardiac Enzymes: No results for input(s): "CKTOTAL", "CKMB", "CKMBINDEX", "TROPONINI" in the last 168 hours. BNP (last 3 results) No results for input(s): "PROBNP" in the last 8760 hours. HbA1C: Recent Labs    10/17/21 1708  HGBA1C 6.2*   CBG: Recent Labs  Lab 10/18/21 0749 10/18/21 1130 10/18/21 1608 10/18/21 2247 10/19/21 1132  GLUCAP 161* 169* 177* 179* 168*   Lipid Profile: No results for input(s): "CHOL", "HDL", "LDLCALC", "TRIG", "CHOLHDL", "LDLDIRECT" in the last 72 hours. Thyroid Function Tests: No results for input(s): "TSH", "T4TOTAL", "FREET4", "T3FREE", "THYROIDAB" in the last 72 hours. Anemia Panel: No results for input(s): "VITAMINB12", "FOLATE", "FERRITIN", "TIBC", "IRON", "RETICCTPCT" in the last 72 hours. Urine analysis:    Component Value Date/Time   COLORURINE YELLOW 04/17/2020 [place] 04/17/2020 1222   LABSPEC 1.020 12/27/2020 1610   PHURINE 7.0 12/27/2020 1610   GLUCOSEU NEGATIVE 12/27/2020 1610   HGBUR NEGATIVE 12/27/2020 1610   BILIRUBINUR NEGATIVE 12/27/2020 1610   [place] 12/27/2020 1610   PROTEINUR >=300 (A) 12/27/2020 1610   UROBILINOGEN 0.2 12/27/2020 1610   NITRITE NEGATIVE 12/27/2020 1610   LEUKOCYTESUR NEGATIVE 12/27/2020 1610   Sepsis Labs: @LABRCNTIP (procalcitonin:4,lacticidven:4)  )No results found for this or any previous visit (from the past 240 hour(s)).   Radiology Studies:  IR Fluoro Guide CV Line Right  Result Date: 10/18/2021 INDICATION: Progressive renal disease in need of initiation of  hemodialysis. EXAM: TUNNELED CENTRAL VENOUS HEMODIALYSIS CATHETER PLACEMENT WITH ULTRASOUND AND FLUOROSCOPIC GUIDANCE MEDICATIONS: 2 g Ancef. The antibiotic was given in an appropriate time interval prior to skin puncture. ANESTHESIA/SEDATION: Moderate (conscious) sedation was employed during this procedure. A total of Versed 0.5 mg and Fentanyl 50 mcg was administered intravenously. Moderate Sedation Time: 13 minutes. The patient's level of consciousness and vital signs were monitored continuously by radiology nursing throughout the procedure under my direct supervision. FLUOROSCOPY TIME:  Radiation exposure index: 1 mGy reference air kerma COMPLICATIONS: None immediate. PROCEDURE: Informed written consent was obtained from the patient after a discussion of the risks, benefits, and alternatives to treatment. Questions regarding the procedure were encouraged and answered. The right neck and chest were prepped with chlorhexidine in a sterile fashion, and a sterile drape was applied covering the operative field. Maximum barrier sterile technique with sterile gowns and gloves were used for the procedure. A timeout was performed prior to the initiation of the procedure. After creating a small venotomy  incision, a micropuncture kit was utilized to access the right internal jugular vein under direct, real-time ultrasound guidance after the overlying soft tissues were anesthetized with 1% lidocaine with epinephrine. Ultrasound image documentation was performed. The microwire was kinked to measure appropriate catheter length. A stiff Glidewire was advanced to the level of the IVC and the micropuncture sheath was exchanged for a peel-away sheath. A Palindrome tunneled hemodialysis catheter measuring 19 cm from tip to cuff was tunneled in a retrograde fashion from the anterior chest wall to the venotomy incision. The catheter was then placed through the peel-away sheath with tips ultimately positioned within the superior  aspect of the right atrium. Final catheter positioning was confirmed and documented with a spot radiographic image. The catheter aspirates and flushes normally. The catheter was flushed with appropriate volume heparin dwells. The catheter exit site was secured with a 0-Prolene retention suture. The venotomy incision was closed with an interrupted 4-0 Vicryl, Dermabond and Steri-strips. Dressings were applied. The patient tolerated the procedure well without immediate post procedural complication. IMPRESSION: Successful placement of 19 cm tip to cuff tunneled hemodialysis catheter via the right internal jugular vein with tips terminating within the superior aspect of the right atrium. The catheter is ready for immediate use. Electronically Signed   By: Jacqulynn Cadet M.D.   On: 10/18/2021 17:21   IR US Guide Vasc Access Right  Result Date: 10/18/2021 INDICATION: Progressive renal disease in need of initiation of hemodialysis. EXAM: TUNNELED CENTRAL VENOUS HEMODIALYSIS CATHETER PLACEMENT WITH ULTRASOUND AND FLUOROSCOPIC GUIDANCE MEDICATIONS: 2 g Ancef. The antibiotic was given in an appropriate time interval prior to skin puncture. ANESTHESIA/SEDATION: Moderate (conscious) sedation was employed during this procedure. A total of Versed 0.5 mg and Fentanyl 50 mcg was administered intravenously. Moderate Sedation Time: 13 minutes. The patient's level of consciousness and vital signs were monitored continuously by radiology nursing throughout the procedure under my direct supervision. FLUOROSCOPY TIME:  Radiation exposure index: 1 mGy reference air kerma COMPLICATIONS: None immediate. PROCEDURE: Informed written consent was obtained from the patient after a discussion of the risks, benefits, and alternatives to treatment. Questions regarding the procedure were encouraged and answered. The right neck and chest were prepped with chlorhexidine in a sterile fashion, and a sterile drape was applied covering the  operative field. Maximum barrier sterile technique with sterile gowns and gloves were used for the procedure. A timeout was performed prior to the initiation of the procedure. After creating a small venotomy incision, a micropuncture kit was utilized to access the right internal jugular vein under direct, real-time ultrasound guidance after the overlying soft tissues were anesthetized with 1% lidocaine with epinephrine. Ultrasound image documentation was performed. The microwire was kinked to measure appropriate catheter length. A stiff Glidewire was advanced to the level of the IVC and the micropuncture sheath was exchanged for a peel-away sheath. A Palindrome tunneled hemodialysis catheter measuring 19 cm from tip to cuff was tunneled in a retrograde fashion from the anterior chest wall to the venotomy incision. The catheter was then placed through the peel-away sheath with tips ultimately positioned within the superior aspect of the right atrium. Final catheter positioning was confirmed and documented with a spot radiographic image. The catheter aspirates and flushes normally. The catheter was flushed with appropriate volume heparin dwells. The catheter exit site was secured with a 0-Prolene retention suture. The venotomy incision was closed with an interrupted 4-0 Vicryl, Dermabond and Steri-strips. Dressings were applied. The patient tolerated the procedure well without immediate post  procedural complication. IMPRESSION: Successful placement of 19 cm tip to cuff tunneled hemodialysis catheter via the right internal jugular vein with tips terminating within the superior aspect of the right atrium. The catheter is ready for immediate use. Electronically Signed   By: Jacqulynn Cadet M.D.   On: 10/18/2021 17:21   DG Chest Portable 1 View  Result Date: 10/17/2021 CLINICAL DATA:  Worsening shortness of breath for 5 days. EXAM: PORTABLE CHEST 1 VIEW COMPARISON:  Chest radiograph 05/12/2021 FINDINGS: The cardiac  silhouette remains enlarged. Aortic atherosclerosis is noted. Compared to the prior study, there is increased pulmonary vascular congestion with mild prominence of the interstitial markings. No airspace consolidation, sizeable pleural effusion, or pneumothorax is identified. No acute osseous abnormality is seen. IMPRESSION: Cardiomegaly with increased pulmonary vascular congestion/early interstitial edema. Electronically Signed   By: Logan Bores M.D.   On: 10/17/2021 16:17     Scheduled Meds:  amLODipine  10 mg Oral Daily   aspirin EC  81 mg Oral QPM   atorvastatin  80 mg Oral QHS   calcitRIOL  0.5 mcg Oral Daily   carvedilol  6.25 mg Oral BID WC   Chlorhexidine Gluconate Cloth  6 each Topical Q0600   clopidogrel  75 mg Oral Daily   darbepoetin (ARANESP) injection - DIALYSIS  150 mcg Intravenous Q Fri-HD   docusate sodium  100 mg Oral BID   heparin  5,000 Units Subcutaneous Q8H   insulin aspart  0-6 Units Subcutaneous TID WC   levothyroxine  200 mcg Oral q morning   senna  1 tablet Oral BID   Continuous Infusions:  furosemide 120 mg (10/19/21 1236)     LOS: 2 days    Time spent: 76mn  PDomenic Polite MD [facility]   10/19/2021, 9:10 AM

## 2021-10-19 NOTE — Plan of Care (Signed)

## 2021-10-19 NOTE — Progress Notes (Signed)
Pt. Had HD cath placed yesterday, 9/14. Experiencing some bleeding. Pressure dressing applied at 0400. Some bleeding still occurring. RN placed gauze below. This RN placed additional pressure on the outside of dressing. Spoke with RN about monitoring throughout the day. If still bleeding this afternoon, 24 hrs after placement, contact IR for further instructions.

## 2021-10-19 NOTE — Progress Notes (Signed)
Patient not in distress but complaining of shortness of breath, sats 88% on ra. I applied o2 at 2l [place] and sats in the 90s. I notified Dr. Joylene Grapes and Broadus John via secure chat. Orders received. Patient to go to HD this afternoon.

## 2021-10-19 NOTE — Progress Notes (Signed)
Patient returned from HD. They changed and cleaned oozing dressing. Patient came back and still had blood oozing. I notified Dr. Broadus John. She stated to hold ASA and plavix for today. Patient informed

## 2021-10-19 NOTE — Progress Notes (Signed)
Received patient in bed to unit.  Alert and oriented.  Informed consent signed and in chart.   Treatment initiated: 1458 Treatment completed: 1729  Patient tolerated well.  Transported back to the room  Alert, without acute distress.  Hand-off given to patient's nurse.   Access used: HD Catheter Access issues: none  Total UF removed: 1056m Medication(s) given: Aranesp 1554m Post HD VS: 98.1, 50, 24, 152/75, 98% on 2L Post HD weight: 110.5kg   [facility]

## 2021-10-19 NOTE — Progress Notes (Signed)
Met with pt at bedside this am. Introduced self and explained role. Pt voices interest in PD treatments. Pt states she has been discussing PD with her out-pt nephrologist and that she has even been in contact with the home therapy staff at [facility]) as well. Discussed possible clinic options for HD until pt ready/able to start PD. Discussed TCU program at [place] for HD treatments and PD education. Pt agreeable to be considered for this program. Program lasts around 30 days and pt goes to HD 4x's a week (Mon/Tues/Thurs/Fri) with shorter treatment times. Referral submitted to [facility] admissions this morning. Pt does drive but feels she will likely need assistance with transportation to HD appts. Contacted CSW via secure chat to request that a [facility] staff member provide pt with possible resources/assistance when able. Spoke to Eschbach with home therapies at [facility]. She is aware of pt's interest in PD and states that pt can call clinic at d/c to schedule home visit or can schedule appt now if someone is at pt's home while pt hospitalized. Spoke to pt via phone who reports that she lives alone and would need to schedule home visit once she returns home. Will assist as needed.   Melven Sartorius Renal Navigator [phone]

## 2021-10-20 ENCOUNTER — Encounter (HOSPITAL_COMMUNITY): Payer: Self-pay | Admitting: Family Medicine

## 2021-10-20 DIAGNOSIS — I5032 Chronic diastolic (congestive) heart failure: Secondary | ICD-10-CM | POA: Diagnosis not present

## 2021-10-20 DIAGNOSIS — J810 Acute pulmonary edema: Secondary | ICD-10-CM | POA: Diagnosis not present

## 2021-10-20 LAB — BASIC METABOLIC PANEL
Anion gap: 13 (ref 5–15)
BUN: 79 mg/dL — ABNORMAL HIGH (ref 8–23)
CO2: 24 mmol/L (ref 22–32)
Calcium: 9.3 mg/dL (ref 8.9–10.3)
Chloride: 104 mmol/L (ref 98–111)
Creatinine, Ser: 4.62 mg/dL — ABNORMAL HIGH (ref 0.44–1.00)
GFR, Estimated: 10 mL/min — ABNORMAL LOW (ref >60)
Glucose, Bld: 169 mg/dL — ABNORMAL HIGH (ref 70–99)
Potassium: 3.4 mmol/L — ABNORMAL LOW (ref 3.5–5.1)
Sodium: 141 mmol/L (ref 135–145)

## 2021-10-20 LAB — CBC
HCT: 25.5 % — ABNORMAL LOW (ref 36.0–46.0)
Hemoglobin: 8.2 g/dL — ABNORMAL LOW (ref 12.0–15.0)
MCH: 28.4 pg (ref 26.0–34.0)
MCHC: 32.2 g/dL (ref 30.0–36.0)
MCV: 88.2 fL (ref 80.0–100.0)
Platelets: 74 10*3/uL — ABNORMAL LOW (ref 150–400)
RBC: 2.89 MIL/uL — ABNORMAL LOW (ref 3.87–5.11)
RDW: 19.4 % — ABNORMAL HIGH (ref 11.5–15.5)
WBC: 5.3 10*3/uL (ref 4.0–10.5)
nRBC: 0 % (ref 0.0–0.2)

## 2021-10-20 LAB — GLUCOSE, CAPILLARY
Glucose-Capillary: 146 mg/dL — ABNORMAL HIGH (ref 70–99)
Glucose-Capillary: 118 mg/dL — ABNORMAL HIGH (ref 70–99)
Glucose-Capillary: 161 mg/dL — ABNORMAL HIGH (ref 70–99)
Glucose-Capillary: 206 mg/dL — ABNORMAL HIGH (ref 70–99)

## 2021-10-20 LAB — MAGNESIUM: Magnesium: 1.9 mg/dL (ref 1.7–2.4)

## 2021-10-20 MED ORDER — HYDRALAZINE HCL 20 MG/ML IJ SOLN
5.0000 mg | INTRAMUSCULAR | Status: DC | PRN
  Administered 2021-10-20 – 2021-10-25 (×2): 5 mg via INTRAVENOUS
  Filled 2021-10-20 (×2): qty 1

## 2021-10-20 MED ORDER — HEPARIN SODIUM (PORCINE) 1000 UNIT/ML IJ SOLN
INTRAMUSCULAR | Status: AC
  Administered 2021-10-20: 3200 [IU]
  Filled 2021-10-20: qty 4

## 2021-10-20 MED ORDER — TRAZODONE HCL 50 MG PO TABS
50.0000 mg | ORAL_TABLET | Freq: Once | ORAL | Status: AC
  Administered 2021-10-20: 50 mg via ORAL
  Filled 2021-10-20: qty 1

## 2021-10-20 MED ORDER — POTASSIUM CHLORIDE CRYS ER 20 MEQ PO TBCR
40.0000 meq | EXTENDED_RELEASE_TABLET | Freq: Once | ORAL | Status: AC
  Administered 2021-10-20: 40 meq via ORAL
  Filled 2021-10-20: qty 2

## 2021-10-20 NOTE — Progress Notes (Signed)
Nephrology Follow-Up Consult note   Assessment/Recommendations: Tiffany [name] is a/an 71 y.o. 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 Appreciate help.  May need their assistance for persistent bleeding -3/3 HD today, next session likely Monday -Volume removal as tolerated with hemodialysis -Continue to monitor daily Cr, Dose meds for GFR -Monitor Daily I/Os, Daily weight  -Maintain MAP>65 for optimal renal perfusion.  -Avoid nephrotoxic medications including NSAIDs -Use synthetic opioids (Fentanyl/Dilaudid) if needed -Start CLIP process, likely good TCU candidate given desire for PD. Hold on AVF for now; discussed with Dr. Johnney Ou   Volume Status: Appears volume overloaded.  Continue with UF on HD.   Hypertension: Continue current medications as prescribed   Anemia of CKD: On retacrit every 3 weeks outpatient. Hgb low. Recent iron studies acceptable. Started aranesp here   Secondary hyperparathyroidism: Phosphorus mildly elevated improving w/ HD.  Continue home calcitriol.   Uncontrolled Diabetes Mellitus Type 2 with Hyperglycemia: Management per primary   Recommendations conveyed to primary service.    [facility] 10/20/2021 10:03 AM  ___________________________________________________________  CC: ESRD  Interval History/Subjective: Patient states she is having some shortness of breath particularly when she lays down flat and tries to sleep.  Persistent bleeding from her catheter insertion site.   Medications:  Current Facility-Administered Medications  Medication Dose Route Frequency Provider Last Rate Last Admin   acetaminophen (TYLENOL) tablet 650 mg  650 mg Oral Q6H PRN Shawna Clamp, MD   650 mg at 10/20/21 9211   Or   acetaminophen  (TYLENOL) suppository 650 mg  650 mg Rectal Q6H PRN Shawna Clamp, MD       albuterol (PROVENTIL) (2.5 MG/3ML) 0.083% nebulizer solution 3 mL  3 mL Inhalation Q6H PRN Shawna Clamp, MD       amLODipine (NORVASC) tablet 10 mg  10 mg Oral Daily Domenic Polite, MD   10 mg at 10/19/21 1827   aspirin EC tablet 81 mg  81 mg Oral QPM Shawna Clamp, MD   81 mg at 10/18/21 1634   atorvastatin (LIPITOR) tablet 80 mg  80 mg Oral QHS Shawna Clamp, MD   80 mg at 10/19/21 2057   calcitRIOL (ROCALTROL) capsule 0.5 mcg  0.5 mcg Oral Daily Reesa Chew, MD   0.5 mcg at 10/19/21 1827   carvedilol (COREG) tablet 6.25 mg  6.25 mg Oral BID WC Domenic Polite, MD   6.25 mg at 10/19/21 1827   Chlorhexidine Gluconate Cloth 2 % PADS 6 each  6 each Topical Q0600 Reesa Chew, MD   6 each at 10/20/21 0551   clopidogrel (PLAVIX) tablet 75 mg  75 mg Oral Daily Shawna Clamp, MD   75 mg at 10/18/21 1130   Darbepoetin Alfa (ARANESP) injection 150 mcg  150 mcg Intravenous Q Fri-HD Reesa Chew, MD   150 mcg at 10/19/21 1517   docusate sodium (COLACE) capsule 100 mg  100 mg Oral BID Shawna Clamp, MD   100 mg at 10/18/21 1130   furosemide (LASIX) 120 mg in dextrose 5 % 50 mL IVPB  120 mg Intravenous BID Reesa Chew, MD 62 mL/hr at 10/19/21 2105 120 mg at 10/19/21 2105   insulin aspart (novoLOG) injection 0-6 Units  0-6 Units Subcutaneous TID WC Dwyane Dee,  Pardeep, MD   1 Units at 10/19/21 1140   levothyroxine (SYNTHROID) tablet 200 mcg  200 mcg Oral q morning Shawna Clamp, MD   200 mcg at 10/20/21 0624   ondansetron (ZOFRAN) tablet 4 mg  4 mg Oral Q6H PRN Shawna Clamp, MD       Or   ondansetron [facility]) injection 4 mg  4 mg Intravenous Q6H PRN Shawna Clamp, MD       senna (SENOKOT) tablet 8.6 mg  1 tablet Oral BID Shawna Clamp, MD   8.6 mg at 10/18/21 1130      Review of Systems: 10 systems reviewed and negative except per interval history/subjective  Physical Exam: Vitals:   10/20/21 0921  10/20/21 0930  BP: (!) 162/84 (!) 150/67  Pulse: (!) 52 (!) 55  Resp: 20 10  Temp:    SpO2: 96% 94%   No intake/output data recorded.  Intake/Output Summary (Last 24 hours) at 10/20/2021 1003 Last data filed at 10/20/2021 0520 Gross per 24 hour  Intake 398.85 ml  Output 1300 ml  Net -901.15 ml   Constitutional: well-appearing, no acute distress ENMT: ears and nose without scars or lesions, MMM CV: Bradycardia, 1+ pitting edema in the bilateral lower extremities Respiratory: Bilateral chest rise, normal work of breathing Gastrointestinal: soft, non-tender, no palpable masses or hernias Skin: no visible lesions or rashes Psych: alert, judgement/insight appropriate, appropriate mood and affect   Test Results I personally reviewed new and old clinical labs and radiology tests Lab Results  Component Value Date   NA 141 10/20/2021   K 3.4 (L) 10/20/2021   CL 104 10/20/2021   CO2 24 10/20/2021   BUN 79 (H) 10/20/2021   CREATININE 4.62 (H) 10/20/2021   CALCIUM 9.3 10/20/2021   ALBUMIN 3.2 (L) 10/18/2021   PHOS 5.6 (H) 10/18/2021    CBC Recent Labs  Lab 10/18/21 0256 10/19/21 0956 10/20/21 0244  WBC 3.9* 5.5 5.3  HGB 7.9* 8.3* 8.2*  HCT 25.1* 26.4* 25.5*  MCV 89.6 88.3 88.2  PLT 83* 92* 74*

## 2021-10-20 NOTE — Progress Notes (Signed)
PROGRESS NOTE    Tiffany [name]  FIE:[id number] DOB: [birth year] DOA: 10/17/2021 PCP: Nolene Ebbs, MD  71/F with history of CKD 4/5, chronic diastolic CHF, hypertension, iron deficiency anemia, type 2 diabetes mellitus, OSA, CAD, sciatica presented to the ED with worsening fatigue shortness of breath, has had progressive kidney disease for a few months, recently saw her cardiologist and agreed to start HD, reports orthopnea.  Worsening lethargy after starting Lyrica for her chronic sciatica pain. -In the ED labs noted potassium of 3.9, bicarb 22, creatinine 6.1, BNP 862, hemoglobin 8.8, chest x-ray with cardiomegaly, pulmonary vascular congestion and early interstitial edema -Right IJ HD cath placed 9/14 and started hemodialysis  Subjective: -Had some bleeding/oozing from around her HD catheter site, tolerated hemodialysis yesterday  Assessment and Plan:  Pulmonary edema: Acute on chronic diastolic CHF: Progressive CKD 5 -Diuresed with IV Lasix initially, she is 3.6 L negative -HD cath placed 9/14, started HD, third HD today -Continue Coreg -?  DC Lasix, will defer to nephrology   New ESRD :  -With some symptoms of uremia -CKD stage IV-V and has been advised hemodialysis in the past. -HD catheter placed, started hemodialysis 9/14 and 9/15, plan for repeat HD today, had some bleeding from around the catheter site overnight, hold heparin today, hold aspirin and Plavix as well -Ambulate, PT OT eval  Metabolic encephalopathy Generalized weakness -Secondary to uremia and decreased renal clearance of Lyrica, now on hold -PT OT to evaluate   Anemia of chronic disease -Iron stores are okay, hemoglobin 7.9 today, likely from CKD and possibly component of hemodilution as well, will benefit from Epogen   Hypothyroidism: Continue levothyroxine 200 mcg daily.   Essential hypertension: -Continue Coreg, clonidine and hydralazine to discontinued   Obesity Diet and exercise discussed in  detail   Hyperlipidemia: Continue Lipitor 80 mg daily   Type 2 diabetes: -HbA1c is 6.2 -CBGs in the 1 60-200 range, continue sliding scale today, may need long-acting insulin   Obstructive sleep apnea: Continue CPAP at night.   CAD: Stable.  Continue Coreg and Lipitor -Hold aspirin Plavix on account of scant oozing around HD catheter site     DVT prophylaxis: Hold heparin subcu Code Status: DNR Family Communication: Daughter at bedside  disposition Plan: Home pending stability after starting HD  Consultants: Renal   Procedures:   Antimicrobials:    Objective: Vitals:   10/20/21 0921 10/20/21 0930 10/20/21 1000 10/20/21 1030  BP: (!) 162/84 (!) 150/67 (!) 161/68 (!) 160/69  Pulse: (!) 52 (!) 55 (!) 58 (!) 57  Resp: 20 10 15 10   Temp:      TempSrc:      SpO2: 96% 94% 94% 92%  Weight:      Height:        Intake/Output Summary (Last 24 hours) at 10/20/2021 1121 Last data filed at 10/20/2021 0520 Gross per 24 hour  Intake 398.85 ml  Output 1300 ml  Net -901.15 ml   Filed Weights   10/19/21 1730 10/20/21 0502 10/20/21 0915  Weight: 110.5 kg 109 kg 111.6 kg    Examination:  General exam: Elderly pleasant female sitting up in bed, AAOx3, no distress HEENT: Positive JVD, right IJ HD catheter, scant blood noted on gauze CVS: S1-S2, regular rhythm Lungs: Decreased at the bases otherwise clear Abdomen: Soft, nontender, bowel sounds present Extremities: Trace edema Skin: No rashes Psychiatry:  Mood & affect appropriate.     Data Reviewed:   CBC: Recent Labs  Lab 10/17/21 1520  10/17/21 1702 10/18/21 0256 10/19/21 0956 10/20/21 0244  WBC 4.8 4.8 3.9* 5.5 5.3  HGB 8.8* 8.8* 7.9* 8.3* 8.2*  HCT 27.6* 28.3* 25.1* 26.4* 25.5*  MCV 90.2 89.6 89.6 88.3 88.2  PLT 103* 109* 83* 92* 74*   Basic Metabolic Panel: Recent Labs  Lab 10/17/21 1520 10/17/21 1702 10/18/21 0256 10/19/21 0956 10/20/21 0244  NA 144  --  145 141 141  K 3.8  --  3.7 3.4* 3.4*   CL 110  --  110 106 104  CO2 22  --  22 23 24   GLUCOSE 183*  --  217* 175* 169*  BUN 124*  --  126* 101* 79*  CREATININE 6.12* 5.94* 6.53* 5.54* 4.62*  CALCIUM 10.0  --  9.7 9.5 9.3  MG  --   --  2.2  --   --   PHOS  --   --  5.6*  --   --    GFR: Estimated Creatinine Clearance: 15.1 mL/min (A) (by C-G formula based on SCr of 4.62 mg/dL (H)). Liver Function Tests: Recent Labs  Lab 10/18/21 0256  AST 16  ALT 15  ALKPHOS 47  BILITOT 0.6  PROT 6.0*  ALBUMIN 3.2*   No results for input(s): "LIPASE", "AMYLASE" in the last 168 hours. No results for input(s): "AMMONIA" in the last 168 hours. Coagulation Profile: No results for input(s): "INR", "PROTIME" in the last 168 hours. Cardiac Enzymes: No results for input(s): "CKTOTAL", "CKMB", "CKMBINDEX", "TROPONINI" in the last 168 hours. BNP (last 3 results) No results for input(s): "PROBNP" in the last 8760 hours. HbA1C: Recent Labs    10/17/21 1708  HGBA1C 6.2*   CBG: Recent Labs  Lab 10/18/21 2247 10/19/21 1132 10/19/21 1813 10/19/21 2058 10/20/21 0808  GLUCAP 179* 168* 128* 164* 146*   Lipid Profile: No results for input(s): "CHOL", "HDL", "LDLCALC", "TRIG", "CHOLHDL", "LDLDIRECT" in the last 72 hours. Thyroid Function Tests: No results for input(s): "TSH", "T4TOTAL", "FREET4", "T3FREE", "THYROIDAB" in the last 72 hours. Anemia Panel: No results for input(s): "VITAMINB12", "FOLATE", "FERRITIN", "TIBC", "IRON", "RETICCTPCT" in the last 72 hours. Urine analysis:    Component Value Date/Time   COLORURINE YELLOW 04/17/2020 [place] 04/17/2020 1222   LABSPEC 1.020 12/27/2020 1610   PHURINE 7.0 12/27/2020 1610   GLUCOSEU NEGATIVE 12/27/2020 1610   HGBUR NEGATIVE 12/27/2020 1610   BILIRUBINUR NEGATIVE 12/27/2020 1610   [place] 12/27/2020 1610   PROTEINUR >=300 (A) 12/27/2020 1610   UROBILINOGEN 0.2 12/27/2020 1610   NITRITE NEGATIVE 12/27/2020 1610   LEUKOCYTESUR NEGATIVE 12/27/2020  1610   Sepsis Labs: @LABRCNTIP (procalcitonin:4,lacticidven:4)  )No results found for this or any previous visit (from the past 240 hour(s)).   Radiology Studies:  IR Fluoro Guide CV Line Right  Result Date: 10/18/2021 INDICATION: Progressive renal disease in need of initiation of hemodialysis. EXAM: TUNNELED CENTRAL VENOUS HEMODIALYSIS CATHETER PLACEMENT WITH ULTRASOUND AND FLUOROSCOPIC GUIDANCE MEDICATIONS: 2 g Ancef. The antibiotic was given in an appropriate time interval prior to skin puncture. ANESTHESIA/SEDATION: Moderate (conscious) sedation was employed during this procedure. A total of Versed 0.5 mg and Fentanyl 50 mcg was administered intravenously. Moderate Sedation Time: 13 minutes. The patient's level of consciousness and vital signs were monitored continuously by radiology nursing throughout the procedure under my direct supervision. FLUOROSCOPY TIME:  Radiation exposure index: 1 mGy reference air kerma COMPLICATIONS: None immediate. PROCEDURE: Informed written consent was obtained from the patient after a discussion of the risks, benefits, and alternatives to treatment.  Questions regarding the procedure were encouraged and answered. The right neck and chest were prepped with chlorhexidine in a sterile fashion, and a sterile drape was applied covering the operative field. Maximum barrier sterile technique with sterile gowns and gloves were used for the procedure. A timeout was performed prior to the initiation of the procedure. After creating a small venotomy incision, a micropuncture kit was utilized to access the right internal jugular vein under direct, real-time ultrasound guidance after the overlying soft tissues were anesthetized with 1% lidocaine with epinephrine. Ultrasound image documentation was performed. The microwire was kinked to measure appropriate catheter length. A stiff Glidewire was advanced to the level of the IVC and the micropuncture sheath was exchanged for a peel-away  sheath. A Palindrome tunneled hemodialysis catheter measuring 19 cm from tip to cuff was tunneled in a retrograde fashion from the anterior chest wall to the venotomy incision. The catheter was then placed through the peel-away sheath with tips ultimately positioned within the superior aspect of the right atrium. Final catheter positioning was confirmed and documented with a spot radiographic image. The catheter aspirates and flushes normally. The catheter was flushed with appropriate volume heparin dwells. The catheter exit site was secured with a 0-Prolene retention suture. The venotomy incision was closed with an interrupted 4-0 Vicryl, Dermabond and Steri-strips. Dressings were applied. The patient tolerated the procedure well without immediate post procedural complication. IMPRESSION: Successful placement of 19 cm tip to cuff tunneled hemodialysis catheter via the right internal jugular vein with tips terminating within the superior aspect of the right atrium. The catheter is ready for immediate use. Electronically Signed   By: Jacqulynn Cadet M.D.   On: 10/18/2021 17:21   IR US Guide Vasc Access Right  Result Date: 10/18/2021 INDICATION: Progressive renal disease in need of initiation of hemodialysis. EXAM: TUNNELED CENTRAL VENOUS HEMODIALYSIS CATHETER PLACEMENT WITH ULTRASOUND AND FLUOROSCOPIC GUIDANCE MEDICATIONS: 2 g Ancef. The antibiotic was given in an appropriate time interval prior to skin puncture. ANESTHESIA/SEDATION: Moderate (conscious) sedation was employed during this procedure. A total of Versed 0.5 mg and Fentanyl 50 mcg was administered intravenously. Moderate Sedation Time: 13 minutes. The patient's level of consciousness and vital signs were monitored continuously by radiology nursing throughout the procedure under my direct supervision. FLUOROSCOPY TIME:  Radiation exposure index: 1 mGy reference air kerma COMPLICATIONS: None immediate. PROCEDURE: Informed written consent was obtained  from the patient after a discussion of the risks, benefits, and alternatives to treatment. Questions regarding the procedure were encouraged and answered. The right neck and chest were prepped with chlorhexidine in a sterile fashion, and a sterile drape was applied covering the operative field. Maximum barrier sterile technique with sterile gowns and gloves were used for the procedure. A timeout was performed prior to the initiation of the procedure. After creating a small venotomy incision, a micropuncture kit was utilized to access the right internal jugular vein under direct, real-time ultrasound guidance after the overlying soft tissues were anesthetized with 1% lidocaine with epinephrine. Ultrasound image documentation was performed. The microwire was kinked to measure appropriate catheter length. A stiff Glidewire was advanced to the level of the IVC and the micropuncture sheath was exchanged for a peel-away sheath. A Palindrome tunneled hemodialysis catheter measuring 19 cm from tip to cuff was tunneled in a retrograde fashion from the anterior chest wall to the venotomy incision. The catheter was then placed through the peel-away sheath with tips ultimately positioned within the superior aspect of the right atrium. Final catheter  positioning was confirmed and documented with a spot radiographic image. The catheter aspirates and flushes normally. The catheter was flushed with appropriate volume heparin dwells. The catheter exit site was secured with a 0-Prolene retention suture. The venotomy incision was closed with an interrupted 4-0 Vicryl, Dermabond and Steri-strips. Dressings were applied. The patient tolerated the procedure well without immediate post procedural complication. IMPRESSION: Successful placement of 19 cm tip to cuff tunneled hemodialysis catheter via the right internal jugular vein with tips terminating within the superior aspect of the right atrium. The catheter is ready for immediate use.  Electronically Signed   By: Jacqulynn Cadet M.D.   On: 10/18/2021 17:21   DG Chest Portable 1 View  Result Date: 10/17/2021 CLINICAL DATA:  Worsening shortness of breath for 5 days. EXAM: PORTABLE CHEST 1 VIEW COMPARISON:  Chest radiograph 05/12/2021 FINDINGS: The cardiac silhouette remains enlarged. Aortic atherosclerosis is noted. Compared to the prior study, there is increased pulmonary vascular congestion with mild prominence of the interstitial markings. No airspace consolidation, sizeable pleural effusion, or pneumothorax is identified. No acute osseous abnormality is seen. IMPRESSION: Cardiomegaly with increased pulmonary vascular congestion/early interstitial edema. Electronically Signed   By: Logan Bores M.D.   On: 10/17/2021 16:17     Scheduled Meds:  amLODipine  10 mg Oral Daily   aspirin EC  81 mg Oral QPM   atorvastatin  80 mg Oral QHS   calcitRIOL  0.5 mcg Oral Daily   carvedilol  6.25 mg Oral BID WC   Chlorhexidine Gluconate Cloth  6 each Topical Q0600   clopidogrel  75 mg Oral Daily   darbepoetin (ARANESP) injection - DIALYSIS  150 mcg Intravenous Q Fri-HD   docusate sodium  100 mg Oral BID   insulin aspart  0-6 Units Subcutaneous TID WC   levothyroxine  200 mcg Oral q morning   senna  1 tablet Oral BID   Continuous Infusions:  furosemide 120 mg (10/19/21 2105)     LOS: 3 days    Time spent: 83mn  PDomenic Polite MD [facility]   10/20/2021, 9:10 AM

## 2021-10-20 NOTE — Progress Notes (Signed)
Paged Dr. Broadus John, pt had 12 beat run of v tach. Pt asymptomatic. Verbally advised to administer carvedilol now and monitor vitals.

## 2021-10-20 NOTE — Progress Notes (Signed)
Made Dr. Retta Diones and nephrology aware that HD site is still bleeding this morning.

## 2021-10-20 NOTE — Plan of Care (Signed)

## 2021-10-21 DIAGNOSIS — J810 Acute pulmonary edema: Secondary | ICD-10-CM | POA: Diagnosis not present

## 2021-10-21 DIAGNOSIS — I5032 Chronic diastolic (congestive) heart failure: Secondary | ICD-10-CM | POA: Diagnosis not present

## 2021-10-21 LAB — BASIC METABOLIC PANEL
Anion gap: 10 (ref 5–15)
BUN: 51 mg/dL — ABNORMAL HIGH (ref 8–23)
CO2: 26 mmol/L (ref 22–32)
Calcium: 8.8 mg/dL — ABNORMAL LOW (ref 8.9–10.3)
Chloride: 103 mmol/L (ref 98–111)
Creatinine, Ser: 3.7 mg/dL — ABNORMAL HIGH (ref 0.44–1.00)
GFR, Estimated: 13 mL/min — ABNORMAL LOW (ref >60)
Glucose, Bld: 117 mg/dL — ABNORMAL HIGH (ref 70–99)
Potassium: 3.8 mmol/L (ref 3.5–5.1)
Sodium: 139 mmol/L (ref 135–145)

## 2021-10-21 LAB — CBC
HCT: 25.4 % — ABNORMAL LOW (ref 36.0–46.0)
Hemoglobin: 8 g/dL — ABNORMAL LOW (ref 12.0–15.0)
MCH: 27.8 pg (ref 26.0–34.0)
MCHC: 31.5 g/dL (ref 30.0–36.0)
MCV: 88.2 fL (ref 80.0–100.0)
Platelets: 64 10*3/uL — ABNORMAL LOW (ref 150–400)
RBC: 2.88 MIL/uL — ABNORMAL LOW (ref 3.87–5.11)
RDW: 19.2 % — ABNORMAL HIGH (ref 11.5–15.5)
WBC: 4.8 10*3/uL (ref 4.0–10.5)
nRBC: 0 % (ref 0.0–0.2)

## 2021-10-21 LAB — GLUCOSE, CAPILLARY
Glucose-Capillary: 122 mg/dL — ABNORMAL HIGH (ref 70–99)
Glucose-Capillary: 135 mg/dL — ABNORMAL HIGH (ref 70–99)
Glucose-Capillary: 151 mg/dL — ABNORMAL HIGH (ref 70–99)
Glucose-Capillary: 166 mg/dL — ABNORMAL HIGH (ref 70–99)

## 2021-10-21 LAB — MAGNESIUM: Magnesium: 1.8 mg/dL (ref 1.7–2.4)

## 2021-10-21 MED ORDER — ACETAMINOPHEN 325 MG PO TABS
650.0000 mg | ORAL_TABLET | Freq: Once | ORAL | Status: AC
  Administered 2021-10-21: 650 mg via ORAL

## 2021-10-21 MED ORDER — ALPRAZOLAM 0.5 MG PO TABS
0.5000 mg | ORAL_TABLET | Freq: Every day | ORAL | Status: AC | PRN
  Administered 2021-10-21: 0.5 mg via ORAL
  Filled 2021-10-21: qty 1

## 2021-10-21 MED ORDER — HYDROXYZINE HCL 25 MG PO TABS
25.0000 mg | ORAL_TABLET | Freq: Every day | ORAL | Status: DC | PRN
  Administered 2021-10-21 – 2021-10-24 (×4): 25 mg via ORAL
  Filled 2021-10-21 (×4): qty 1

## 2021-10-21 NOTE — Progress Notes (Signed)
PROGRESS NOTE    Tiffany [name]  ZOX:[id number] DOB: [birth year] DOA: 10/17/2021 PCP: Nolene Ebbs, MD  71/F with history of CKD 4/5, chronic diastolic CHF, hypertension, iron deficiency anemia, type 2 diabetes mellitus, OSA, CAD, sciatica presented to the ED with worsening fatigue shortness of breath, has had progressive kidney disease for a few months, recently saw her cardiologist and agreed to start HD, reports orthopnea.  Worsening lethargy after starting Lyrica for her chronic sciatica pain. -In the ED labs noted potassium of 3.9, bicarb 22, creatinine 6.1, BNP 862, hemoglobin 8.8, chest x-ray with cardiomegaly, pulmonary vascular congestion and early interstitial edema -Right IJ HD cath placed 9/14 and started hemodialysis  Subjective: -He is much better after HD yesterday, no further bleeding/oozing from HD catheter site  Assessment and Plan:  Pulmonary edema: Acute on chronic diastolic CHF: Progressive CKD 5 -Diuresed with IV Lasix initially, she is 3.6 L negative -HD cath placed 9/14, started HD, -Continue Coreg, Lasix discontinued -CLIP for outpatient HD   New ESRD :  -With some symptoms of uremia -CKD stage IV-V and has been advised hemodialysis in the past. -HD catheter placed, started hemodialysis 9/14, completed 3 dialysis treatments, volume status is improved, had some bleeding from around the catheter site 9/15 -16, improving, hold subcu heparin and aspirin Plavix 1 more day  -Ambulate, PT OT eval  Metabolic encephalopathy Generalized weakness -Secondary to uremia and decreased renal clearance of Lyrica, now on hold -PT OT to evaluate   Anemia of chronic disease -Iron stores are okay, hemoglobin 8.0 -Continue EPO with HD   Hypothyroidism: Continue levothyroxine 200 mcg daily.   Essential hypertension: -Continue Coreg,  -clonidine and hydralazine  discontinued   Obesity Diet and exercise discussed in detail   Hyperlipidemia: Continue Lipitor 80 mg  daily   Type 2 diabetes: -HbA1c is 6.2 -CBGs improved   Obstructive sleep apnea: Continue CPAP at night.   CAD: Stable.  Continue Coreg and Lipitor -Hold aspirin Plavix on account of scant oozing around HD catheter site     DVT prophylaxis: Hold heparin subcu Code Status: DNR Family Communication: Discussed patient in detail, no family at bedside disposition Plan: Home pending CLIP for hemodialysis  Consultants: Renal   Procedures:   Antimicrobials:    Objective: Vitals:   10/20/21 1905 10/20/21 2030 10/20/21 2133 10/21/21 0449  BP: (!) 162/88 (!) 162/72 139/72 (!) 151/63  Pulse: 61 (!) 59 (!) 55 (!) 56  Resp: 16 (!) 22 (!) 23 16  Temp:  98.1 F (36.7 C)  98 F (36.7 C)  TempSrc:  Oral  Oral  SpO2: 96% 92% 96% 93%  Weight:    108.1 kg  Height:        Intake/Output Summary (Last 24 hours) at 10/21/2021 1024 Last data filed at 10/21/2021 0400 Gross per 24 hour  Intake 240 ml  Output 402 ml  Net -162 ml   Filed Weights   10/20/21 1252 10/20/21 1326 10/21/21 0449  Weight: 111.2 kg 111.2 kg 108.1 kg    Examination:  General exam: Pleasant elderly female sitting up in bed, AAOx3, no distress HEENT: No JVD, Right IJ HD catheter with no further oozing  CVS: S1-S2, regular rhythm Lungs: Decreased breath sounds to bases Abdomen: Soft, nontender, bowel sounds present Extremities: Trace edema  Skin: No rashes Psychiatry:  Mood & affect appropriate.     Data Reviewed:   CBC: Recent Labs  Lab 10/17/21 1702 10/18/21 0256 10/19/21 0956 10/20/21 0244 10/21/21 0704  WBC 4.8  3.9* 5.5 5.3 4.8  HGB 8.8* 7.9* 8.3* 8.2* 8.0*  HCT 28.3* 25.1* 26.4* 25.5* 25.4*  MCV 89.6 89.6 88.3 88.2 88.2  PLT 109* 83* 92* 74* 64*   Basic Metabolic Panel: Recent Labs  Lab 10/17/21 1520 10/17/21 1702 10/18/21 0256 10/19/21 0956 10/20/21 0244 10/21/21 0704  NA 144  --  145 141 141 139  K 3.8  --  3.7 3.4* 3.4* 3.8  CL 110  --  110 106 104 103  CO2 22  --  _0 GLUCOSE 183*  --  217* 175* 169* 117*  BUN 124*  --  126* 101* 79* 51*  CREATININE 6.12* 5.94* 6.53* 5.54* 4.62* 3.70*  CALCIUM 10.0  --  9.7 9.5 9.3 8.8*  MG  --   --  2.2  --  1.9 1.8  PHOS  --   --  5.6*  --   --   --    GFR: Estimated Creatinine Clearance: 18.5 mL/min (A) (by C-G formula based on SCr of 3.7 mg/dL (H)). Liver Function Tests: Recent Labs  Lab 10/18/21 0256  AST 16  ALT 15  ALKPHOS 47  BILITOT 0.6  PROT 6.0*  ALBUMIN 3.2*   No results for input(s): "LIPASE", "AMYLASE" in the last 168 hours. No results for input(s): "AMMONIA" in the last 168 hours. Coagulation Profile: No results for input(s): "INR", "PROTIME" in the last 168 hours. Cardiac Enzymes: No results for input(s): "CKTOTAL", "CKMB", "CKMBINDEX", "TROPONINI" in the last 168 hours. BNP (last 3 results) No results for input(s): "PROBNP" in the last 8760 hours. HbA1C: No results for input(s): "HGBA1C" in the last 72 hours.  CBG: Recent Labs  Lab 10/20/21 0808 10/20/21 1349 10/20/21 1638 10/20/21 2119 10/21/21 0803  GLUCAP 146* 118* 161* 206* 122*   Lipid Profile: No results for input(s): "CHOL", "HDL", "LDLCALC", "TRIG", "CHOLHDL", "LDLDIRECT" in the last 72 hours. Thyroid Function Tests: No results for input(s): "TSH", "T4TOTAL", "FREET4", "T3FREE", "THYROIDAB" in the last 72 hours. Anemia Panel: No results for input(s): "VITAMINB12", "FOLATE", "FERRITIN", "TIBC", "IRON", "RETICCTPCT" in the last 72 hours. Urine analysis:    Component Value Date/Time   COLORURINE YELLOW 04/17/2020 [place] 04/17/2020 1222   LABSPEC 1.020 12/27/2020 1610   PHURINE 7.0 12/27/2020 1610   GLUCOSEU NEGATIVE 12/27/2020 1610   HGBUR NEGATIVE 12/27/2020 1610   BILIRUBINUR NEGATIVE 12/27/2020 1610   [place] 12/27/2020 1610   PROTEINUR >=300 (A) 12/27/2020 1610   UROBILINOGEN 0.2 12/27/2020 1610   NITRITE NEGATIVE 12/27/2020 1610   LEUKOCYTESUR NEGATIVE 12/27/2020 1610    Sepsis Labs: _1 (procalcitonin:4,lacticidven:4)  )No results found for this or any previous visit (from the past 240 hour(s)).   Radiology Studies:  IR Fluoro Guide CV Line Right  Result Date: 10/18/2021 INDICATION: Progressive renal disease in need of initiation of hemodialysis. EXAM: TUNNELED CENTRAL VENOUS HEMODIALYSIS CATHETER PLACEMENT WITH ULTRASOUND AND FLUOROSCOPIC GUIDANCE MEDICATIONS: 2 g Ancef. The antibiotic was given in an appropriate time interval prior to skin puncture. ANESTHESIA/SEDATION: Moderate (conscious) sedation was employed during this procedure. A total of Versed 0.5 mg and Fentanyl 50 mcg was administered intravenously. Moderate Sedation Time: 13 minutes. The patient's level of consciousness and vital signs were monitored continuously by radiology nursing throughout the procedure under my direct supervision. FLUOROSCOPY TIME:  Radiation exposure index: 1 mGy reference air kerma COMPLICATIONS: None immediate. PROCEDURE: Informed written consent was obtained from the patient after a discussion of the risks, benefits, and alternatives to treatment.  Questions regarding the procedure were encouraged and answered. The right neck and chest were prepped with chlorhexidine in a sterile fashion, and a sterile drape was applied covering the operative field. Maximum barrier sterile technique with sterile gowns and gloves were used for the procedure. A timeout was performed prior to the initiation of the procedure. After creating a small venotomy incision, a micropuncture kit was utilized to access the right internal jugular vein under direct, real-time ultrasound guidance after the overlying soft tissues were anesthetized with 1% lidocaine with epinephrine. Ultrasound image documentation was performed. The microwire was kinked to measure appropriate catheter length. A stiff Glidewire was advanced to the level of the IVC and the micropuncture sheath was exchanged for a peel-away  sheath. A Palindrome tunneled hemodialysis catheter measuring 19 cm from tip to cuff was tunneled in a retrograde fashion from the anterior chest wall to the venotomy incision. The catheter was then placed through the peel-away sheath with tips ultimately positioned within the superior aspect of the right atrium. Final catheter positioning was confirmed and documented with a spot radiographic image. The catheter aspirates and flushes normally. The catheter was flushed with appropriate volume heparin dwells. The catheter exit site was secured with a 0-Prolene retention suture. The venotomy incision was closed with an interrupted 4-0 Vicryl, Dermabond and Steri-strips. Dressings were applied. The patient tolerated the procedure well without immediate post procedural complication. IMPRESSION: Successful placement of 19 cm tip to cuff tunneled hemodialysis catheter via the right internal jugular vein with tips terminating within the superior aspect of the right atrium. The catheter is ready for immediate use. Electronically Signed   By: Jacqulynn Cadet M.D.   On: 10/18/2021 17:21   IR US Guide Vasc Access Right  Result Date: 10/18/2021 INDICATION: Progressive renal disease in need of initiation of hemodialysis. EXAM: TUNNELED CENTRAL VENOUS HEMODIALYSIS CATHETER PLACEMENT WITH ULTRASOUND AND FLUOROSCOPIC GUIDANCE MEDICATIONS: 2 g Ancef. The antibiotic was given in an appropriate time interval prior to skin puncture. ANESTHESIA/SEDATION: Moderate (conscious) sedation was employed during this procedure. A total of Versed 0.5 mg and Fentanyl 50 mcg was administered intravenously. Moderate Sedation Time: 13 minutes. The patient's level of consciousness and vital signs were monitored continuously by radiology nursing throughout the procedure under my direct supervision. FLUOROSCOPY TIME:  Radiation exposure index: 1 mGy reference air kerma COMPLICATIONS: None immediate. PROCEDURE: Informed written consent was obtained  from the patient after a discussion of the risks, benefits, and alternatives to treatment. Questions regarding the procedure were encouraged and answered. The right neck and chest were prepped with chlorhexidine in a sterile fashion, and a sterile drape was applied covering the operative field. Maximum barrier sterile technique with sterile gowns and gloves were used for the procedure. A timeout was performed prior to the initiation of the procedure. After creating a small venotomy incision, a micropuncture kit was utilized to access the right internal jugular vein under direct, real-time ultrasound guidance after the overlying soft tissues were anesthetized with 1% lidocaine with epinephrine. Ultrasound image documentation was performed. The microwire was kinked to measure appropriate catheter length. A stiff Glidewire was advanced to the level of the IVC and the micropuncture sheath was exchanged for a peel-away sheath. A Palindrome tunneled hemodialysis catheter measuring 19 cm from tip to cuff was tunneled in a retrograde fashion from the anterior chest wall to the venotomy incision. The catheter was then placed through the peel-away sheath with tips ultimately positioned within the superior aspect of the right atrium. Final catheter  positioning was confirmed and documented with a spot radiographic image. The catheter aspirates and flushes normally. The catheter was flushed with appropriate volume heparin dwells. The catheter exit site was secured with a 0-Prolene retention suture. The venotomy incision was closed with an interrupted 4-0 Vicryl, Dermabond and Steri-strips. Dressings were applied. The patient tolerated the procedure well without immediate post procedural complication. IMPRESSION: Successful placement of 19 cm tip to cuff tunneled hemodialysis catheter via the right internal jugular vein with tips terminating within the superior aspect of the right atrium. The catheter is ready for immediate use.  Electronically Signed   By: Jacqulynn Cadet M.D.   On: 10/18/2021 17:21   DG Chest Portable 1 View  Result Date: 10/17/2021 CLINICAL DATA:  Worsening shortness of breath for 5 days. EXAM: PORTABLE CHEST 1 VIEW COMPARISON:  Chest radiograph 05/12/2021 FINDINGS: The cardiac silhouette remains enlarged. Aortic atherosclerosis is noted. Compared to the prior study, there is increased pulmonary vascular congestion with mild prominence of the interstitial markings. No airspace consolidation, sizeable pleural effusion, or pneumothorax is identified. No acute osseous abnormality is seen. IMPRESSION: Cardiomegaly with increased pulmonary vascular congestion/early interstitial edema. Electronically Signed   By: Logan Bores M.D.   On: 10/17/2021 16:17     Scheduled Meds:  amLODipine  10 mg Oral Daily   aspirin EC  81 mg Oral QPM   atorvastatin  80 mg Oral QHS   calcitRIOL  0.5 mcg Oral Daily   carvedilol  6.25 mg Oral BID WC   Chlorhexidine Gluconate Cloth  6 each Topical Q0600   clopidogrel  75 mg Oral Daily   darbepoetin (ARANESP) injection - DIALYSIS  150 mcg Intravenous Q Fri-HD   docusate sodium  100 mg Oral BID   insulin aspart  0-6 Units Subcutaneous TID WC   levothyroxine  200 mcg Oral q morning   senna  1 tablet Oral BID   Continuous Infusions:     LOS: 4 days    Time spent: 74mn  PDomenic Polite MD [facility]   10/21/2021, 9:10 AM

## 2021-10-21 NOTE — Plan of Care (Signed)

## 2021-10-21 NOTE — Progress Notes (Signed)
Mobility Specialist Progress Note:   10/21/21 0900  Mobility  Activity Refused mobility   Pt having 10/10 knee pain and asking me to come back. Will follow-up as time allows.   [facility] Surveyor, mining Chat only

## 2021-10-21 NOTE — Progress Notes (Addendum)
Nephrology Follow-Up Consult note   Assessment/Recommendations: Tiffany [name] is a/an 71 y.o. 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 Appreciate help.   -Dialysis again tomorrow and maintain MWF schedule -Volume removal as tolerated with hemodialysis -Continue to monitor daily Cr, Dose meds for GFR -Monitor Daily I/Os, Daily weight  -Maintain MAP>65 for optimal renal perfusion.  -Avoid nephrotoxic medications including NSAIDs -Use synthetic opioids (Fentanyl/Dilaudid) if needed -Start CLIP process, likely good TCU candidate given desire for PD. Hold on AVF for now; discussed with Dr. Johnney Ou -Having some anxiety on dialysis.  Will provide Atarax 25 mg as needed   Volume Status: Appears volume overloaded.  Continue with UF on HD.  Overall improved   Hypertension: Continue current medications as prescribed   Anemia of CKD: On retacrit every 3 weeks outpatient. Hgb low. Recent iron studies acceptable. Started aranesp here and received on 10/19/2021   Secondary hyperparathyroidism: Phosphorus mildly elevated improving w/ HD.  Continue home calcitriol.   Uncontrolled Diabetes Mellitus Type 2 with Hyperglycemia: Management per primary   Recommendations conveyed to primary service.    [facility] 10/21/2021 11:19 AM  ___________________________________________________________  CC: ESRD  Interval History/Subjective: Patient states she is doing okay today.  Shortness of breath has improved.  Tolerated dialysis yesterday with no issues.  No further bleeding from catheter site.  Does have some anxiety associated with dialysis.  Medications:  Current Facility-Administered Medications  Medication Dose Route Frequency Provider Last Rate Last Admin    acetaminophen (TYLENOL) tablet 650 mg  650 mg Oral Q6H PRN Shawna Clamp, MD   650 mg at 10/21/21 0408   Or   acetaminophen (TYLENOL) suppository 650 mg  650 mg Rectal Q6H PRN Shawna Clamp, MD       albuterol (PROVENTIL) (2.5 MG/3ML) 0.083% nebulizer solution 3 mL  3 mL Inhalation Q6H PRN Shawna Clamp, MD       amLODipine (NORVASC) tablet 10 mg  10 mg Oral Daily Domenic Polite, MD   10 mg at 10/21/21 0908   aspirin EC tablet 81 mg  81 mg Oral QPM Shawna Clamp, MD   81 mg at 10/18/21 1634   atorvastatin (LIPITOR) tablet 80 mg  80 mg Oral QHS Shawna Clamp, MD   80 mg at 10/20/21 2036   calcitRIOL (ROCALTROL) capsule 0.5 mcg  0.5 mcg Oral Daily Reesa Chew, MD   0.5 mcg at 10/21/21 0909   carvedilol (COREG) tablet 6.25 mg  6.25 mg Oral BID WC Domenic Polite, MD   6.25 mg at 10/21/21 0908   Chlorhexidine Gluconate Cloth 2 % PADS 6 each  6 each Topical Q0600 Reesa Chew, MD   6 each at 10/21/21 0500   clopidogrel (PLAVIX) tablet 75 mg  75 mg Oral Daily Shawna Clamp, MD   75 mg at 10/21/21 0908   Darbepoetin Alfa (ARANESP) injection 150 mcg  150 mcg Intravenous Q Fri-HD Reesa Chew, MD   150 mcg at 10/19/21 1517   docusate sodium (COLACE) capsule 100 mg  100 mg Oral BID Shawna Clamp, MD   100 mg at 10/18/21 1130   hydrALAZINE (APRESOLINE) injection 5 mg  5 mg Intravenous Q4H PRN Rise Patience, MD   5 mg at 10/20/21 2037  insulin aspart (novoLOG) injection 0-6 Units  0-6 Units Subcutaneous TID WC Shawna Clamp, MD   1 Units at 10/20/21 1649   levothyroxine (SYNTHROID) tablet 200 mcg  200 mcg Oral q morning Shawna Clamp, MD   200 mcg at 10/21/21 0704   ondansetron (ZOFRAN) tablet 4 mg  4 mg Oral Q6H PRN Shawna Clamp, MD       Or   ondansetron [facility]) injection 4 mg  4 mg Intravenous Q6H PRN Shawna Clamp, MD       senna (SENOKOT) tablet 8.6 mg  1 tablet Oral BID Shawna Clamp, MD   8.6 mg at 10/18/21 1130      Review of Systems: 10 systems reviewed and  negative except per interval history/subjective  Physical Exam: Vitals:   10/20/21 2133 10/21/21 0449  BP: 139/72 (!) 151/63  Pulse: (!) 55 (!) 56  Resp: (!) 23 16  Temp:  98 F (36.7 C)  SpO2: 96% 93%   No intake/output data recorded.  Intake/Output Summary (Last 24 hours) at 10/21/2021 1119 Last data filed at 10/21/2021 0400 Gross per 24 hour  Intake 240 ml  Output 402 ml  Net -162 ml   Constitutional: well-appearing, no acute distress ENMT: ears and nose without scars or lesions, MMM CV: Bradycardia, 1+ pitting edema in the bilateral lower extremities Respiratory: Bilateral chest rise, normal work of breathing Gastrointestinal: soft, non-tender, no palpable masses or hernias Skin: no visible lesions or rashes Psych: alert, judgement/insight appropriate, appropriate mood and affect   Test Results I personally reviewed new and old clinical labs and radiology tests Lab Results  Component Value Date   NA 139 10/21/2021   K 3.8 10/21/2021   CL 103 10/21/2021   CO2 26 10/21/2021   BUN 51 (H) 10/21/2021   CREATININE 3.70 (H) 10/21/2021   CALCIUM 8.8 (L) 10/21/2021   ALBUMIN 3.2 (L) 10/18/2021   PHOS 5.6 (H) 10/18/2021    CBC Recent Labs  Lab 10/19/21 0956 10/20/21 0244 10/21/21 0704  WBC 5.5 5.3 4.8  HGB 8.3* 8.2* 8.0*  HCT 26.4* 25.5* 25.4*  MCV 88.3 88.2 88.2  PLT 92* 74* 64*

## 2021-10-22 ENCOUNTER — Inpatient Hospital Stay (HOSPITAL_COMMUNITY): Payer: Medicare Other

## 2021-10-22 DIAGNOSIS — J810 Acute pulmonary edema: Secondary | ICD-10-CM | POA: Diagnosis not present

## 2021-10-22 LAB — BASIC METABOLIC PANEL
Anion gap: 9 (ref 5–15)
BUN: 53 mg/dL — ABNORMAL HIGH (ref 8–23)
CO2: 28 mmol/L (ref 22–32)
Calcium: 9.3 mg/dL (ref 8.9–10.3)
Chloride: 104 mmol/L (ref 98–111)
Creatinine, Ser: 4.07 mg/dL — ABNORMAL HIGH (ref 0.44–1.00)
GFR, Estimated: 11 mL/min — ABNORMAL LOW (ref >60)
Glucose, Bld: 107 mg/dL — ABNORMAL HIGH (ref 70–99)
Potassium: 4 mmol/L (ref 3.5–5.1)
Sodium: 141 mmol/L (ref 135–145)

## 2021-10-22 LAB — GLUCOSE, CAPILLARY
Glucose-Capillary: 147 mg/dL — ABNORMAL HIGH (ref 70–99)
Glucose-Capillary: 142 mg/dL — ABNORMAL HIGH (ref 70–99)
Glucose-Capillary: 138 mg/dL — ABNORMAL HIGH (ref 70–99)

## 2021-10-22 LAB — CBC
HCT: 25.3 % — ABNORMAL LOW (ref 36.0–46.0)
Hemoglobin: 8 g/dL — ABNORMAL LOW (ref 12.0–15.0)
MCH: 28.1 pg (ref 26.0–34.0)
MCHC: 31.6 g/dL (ref 30.0–36.0)
MCV: 88.8 fL (ref 80.0–100.0)
Platelets: 64 10*3/uL — ABNORMAL LOW (ref 150–400)
RBC: 2.85 MIL/uL — ABNORMAL LOW (ref 3.87–5.11)
RDW: 18.8 % — ABNORMAL HIGH (ref 11.5–15.5)
WBC: 4.9 10*3/uL (ref 4.0–10.5)
nRBC: 0 % (ref 0.0–0.2)

## 2021-10-22 LAB — URIC ACID: Uric Acid, Serum: 4.6 mg/dL (ref 2.5–7.1)

## 2021-10-22 MED ORDER — TRAMADOL HCL 50 MG PO TABS
25.0000 mg | ORAL_TABLET | Freq: Once | ORAL | Status: AC
  Administered 2021-10-22: 25 mg via ORAL
  Filled 2021-10-22: qty 1

## 2021-10-22 MED ORDER — HEPARIN SODIUM (PORCINE) 1000 UNIT/ML IJ SOLN
INTRAMUSCULAR | Status: AC
  Administered 2021-10-22: 1000 [IU]
  Filled 2021-10-22: qty 4

## 2021-10-22 NOTE — Progress Notes (Signed)
Tiffany [name]  TRV:[id number] DOB: Aug 15, 1950 DOA: 10/17/2021 PCP: Nolene Ebbs, MD  71/F with history of CKD 4/5, chronic diastolic CHF, hypertension, iron deficiency anemia, type 2 diabetes mellitus, OSA, CAD, sciatica presented to the ED with worsening fatigue shortness of breath, has had progressive kidney disease for a few months, recently saw her cardiologist and agreed to start HD, reports orthopnea.  Worsening lethargy after starting Lyrica for her chronic sciatica pain. -In the ED labs noted potassium of 3.9, bicarb 22, creatinine 6.1, BNP 862, hemoglobin 8.8, chest x-ray with cardiomegaly, pulmonary vascular congestion and early interstitial edema -Right IJ HD cath placed 9/14 and started hemodialysis  Subjective: Prior to HD the patient had no new complaints.  HD on 10/22/2021.  Still edematous.  Bilateral feet are swollen with difficulty standing on them.  Could not be assessed by PT OT today due to her swollen feet.  PT OT will assess tomorrow.  Assessment and Plan:  Pulmonary edema: Acute on chronic diastolic CHF: Progressive CKD 5, now ESRD. -Diuresed with IV Lasix initially, she is 7.0 L negative -HD cath placed 9/14, started HD. Patient has an outpatient HD spot and can start on 10/23/2021 AM, Tuesday Thursday Saturday. -Continue Coreg, Lasix discontinued   New ESRD :  -With some symptoms of uremia -CKD stage IV-V and has been advised hemodialysis in the past. -HD catheter placed, started hemodialysis 9/14, completed 4 dialysis treatments, today included.  Volume status is improved, but still edematous.  Had some bleeding from around the catheter site 9/15 -16.  No recurrent bleeding reported. -Ambulate as tolerated, PT OT eval likely on 10/23/2021.  Resolved metabolic encephalopathy Generalized weakness -Secondary to uremia and decreased renal clearance of Lyrica Continue to hold off home Lyrica  -PT OT to evaluate   Anemia of chronic  disease -Iron stores are okay, hemoglobin 8.0 -Continue EPO with HD Repeat CBC in the morning   Hypothyroidism: Continue levothyroxine 200 mcg daily.   Essential hypertension: -Continue Coreg 6.25 mg twice daily, and Norvasc 10 mg daily -clonidine and hydralazine  discontinued Continue to monitor vital signs   Obesity BMI 34 Recommend weight loss outpatient with regular physical activity and healthy dieting.   Hyperlipidemia: Continue Lipitor 80 mg daily   Type 2 diabetes with hyperglycemia: -HbA1c is 6.2% -CBGs improved   Obstructive sleep apnea: Continue CPAP at night.   CAD: She denies any anginal symptoms. Continue Coreg Continue aspirin, Plavix, Lipitor  Thrombocytopenia Platelets 64 Monitor     DVT prophylaxis: Subcu heparin on hold. Code Status: DNR Family Communication: Discussed patient in detail, no family at bedside Disposition Plan: Home pending PT OT evaluation likely on 10/23/2021.  Consultants: Renal   Procedures:   Antimicrobials:    Objective: Vitals:   10/22/21 1415 10/22/21 1432 10/22/21 1613 10/22/21 1632  BP: (!) 160/88   (!) 158/76  Pulse: (!) 57   (!) 58  Resp: 19  17 17   Temp: 98 F (36.7 C)   97.8 F (36.6 C)  TempSrc: Oral   Oral  SpO2: 98%   100%  Weight:  105 kg    Height:        Intake/Output Summary (Last 24 hours) at 10/22/2021 1748 Last data filed at 10/22/2021 1415 Gross per 24 hour  Intake --  Output 3200 ml  Net -3200 ml   Filed Weights   10/21/21 0449 10/22/21 0419 10/22/21 1432  Weight: 108.1 kg 108.1 kg 105 kg    Examination:  General exam: Pleasant elderly female in no acute distress.  She is alert oriented x3. HEENT: No JVD, Right IJ HD catheter with no further oozing  CVS: Regular rate and rhythm no rubs thrills.   Lungs: Mild rales at bases with no wheezing noted.  Poor inspiratory effort. Abdomen: Soft, nontender, bowel sounds present Extremities: 1+ pitting edema in lower extremities  bilaterally. Skin: No rashes Psychiatry: Mood is appropriate for condition and setting.    Data Reviewed:   CBC: Recent Labs  Lab 10/18/21 0256 10/19/21 0956 10/20/21 0244 10/21/21 0704 10/22/21 0544  WBC 3.9* 5.5 5.3 4.8 4.9  HGB 7.9* 8.3* 8.2* 8.0* 8.0*  HCT 25.1* 26.4* 25.5* 25.4* 25.3*  MCV 89.6 88.3 88.2 88.2 88.8  PLT 83* 92* 74* 64* 64*   Basic Metabolic Panel: Recent Labs  Lab 10/18/21 0256 10/19/21 0956 10/20/21 0244 10/21/21 0704 10/22/21 0544  NA 145 141 141 139 141  K 3.7 3.4* 3.4* 3.8 4.0  CL 110 106 104 103 104  CO2 22 23 24 26 28   GLUCOSE 217* 175* 169* 117* 107*  BUN 126* 101* 79* 51* 53*  CREATININE 6.53* 5.54* 4.62* 3.70* 4.07*  CALCIUM 9.7 9.5 9.3 8.8* 9.3  MG 2.2  --  1.9 1.8  --   PHOS 5.6*  --   --   --   --    GFR: Estimated Creatinine Clearance: 16.6 mL/min (A) (by C-G formula based on SCr of 4.07 mg/dL (H)). Liver Function Tests: Recent Labs  Lab 10/18/21 0256  AST 16  ALT 15  ALKPHOS 47  BILITOT 0.6  PROT 6.0*  ALBUMIN 3.2*   No results for input(s): "LIPASE", "AMYLASE" in the last 168 hours. No results for input(s): "AMMONIA" in the last 168 hours. Coagulation Profile: No results for input(s): "INR", "PROTIME" in the last 168 hours. Cardiac Enzymes: No results for input(s): "CKTOTAL", "CKMB", "CKMBINDEX", "TROPONINI" in the last 168 hours. BNP (last 3 results) No results for input(s): "PROBNP" in the last 8760 hours. HbA1C: No results for input(s): "HGBA1C" in the last 72 hours.  CBG: Recent Labs  Lab 10/21/21 1204 10/21/21 1604 10/21/21 2041 10/22/21 0825 10/22/21 1630  GLUCAP 135* 151* 166* 147* 142*   Lipid Profile: No results for input(s): "CHOL", "HDL", "LDLCALC", "TRIG", "CHOLHDL", "LDLDIRECT" in the last 72 hours. Thyroid Function Tests: No results for input(s): "TSH", "T4TOTAL", "FREET4", "T3FREE", "THYROIDAB" in the last 72 hours. Anemia Panel: No results for input(s): "VITAMINB12", "FOLATE",  "FERRITIN", "TIBC", "IRON", "RETICCTPCT" in the last 72 hours. Urine analysis:    Component Value Date/Time   COLORURINE YELLOW 04/17/2020 [place] 04/17/2020 1222   LABSPEC 1.020 12/27/2020 1610   PHURINE 7.0 12/27/2020 1610   GLUCOSEU NEGATIVE 12/27/2020 1610   HGBUR NEGATIVE 12/27/2020 1610   BILIRUBINUR NEGATIVE 12/27/2020 1610   [place] 12/27/2020 1610   PROTEINUR >=300 (A) 12/27/2020 1610   UROBILINOGEN 0.2 12/27/2020 1610   NITRITE NEGATIVE 12/27/2020 1610   LEUKOCYTESUR NEGATIVE 12/27/2020 1610   Sepsis Labs: @LABRCNTIP (procalcitonin:4,lacticidven:4)  )No results found for this or any previous visit (from the past 240 hour(s)).   Radiology Studies:  IR Fluoro Guide CV Line Right  Result Date: 10/18/2021 INDICATION: Progressive renal disease in need of initiation of hemodialysis. EXAM: TUNNELED CENTRAL VENOUS HEMODIALYSIS CATHETER PLACEMENT WITH ULTRASOUND AND FLUOROSCOPIC GUIDANCE MEDICATIONS: 2 g Ancef. The antibiotic was given in an appropriate time interval prior to skin puncture. ANESTHESIA/SEDATION: Moderate (conscious) sedation was employed during this procedure. A total of  Versed 0.5 mg and Fentanyl 50 mcg was administered intravenously. Moderate Sedation Time: 13 minutes. The patient's level of consciousness and vital signs were monitored continuously by radiology nursing throughout the procedure under my direct supervision. FLUOROSCOPY TIME:  Radiation exposure index: 1 mGy reference air kerma COMPLICATIONS: None immediate. PROCEDURE: Informed written consent was obtained from the patient after a discussion of the risks, benefits, and alternatives to treatment. Questions regarding the procedure were encouraged and answered. The right neck and chest were prepped with chlorhexidine in a sterile fashion, and a sterile drape was applied covering the operative field. Maximum barrier sterile technique with sterile gowns and gloves were used for the  procedure. A timeout was performed prior to the initiation of the procedure. After creating a small venotomy incision, a micropuncture kit was utilized to access the right internal jugular vein under direct, real-time ultrasound guidance after the overlying soft tissues were anesthetized with 1% lidocaine with epinephrine. Ultrasound image documentation was performed. The microwire was kinked to measure appropriate catheter length. A stiff Glidewire was advanced to the level of the IVC and the micropuncture sheath was exchanged for a peel-away sheath. A Palindrome tunneled hemodialysis catheter measuring 19 cm from tip to cuff was tunneled in a retrograde fashion from the anterior chest wall to the venotomy incision. The catheter was then placed through the peel-away sheath with tips ultimately positioned within the superior aspect of the right atrium. Final catheter positioning was confirmed and documented with a spot radiographic image. The catheter aspirates and flushes normally. The catheter was flushed with appropriate volume heparin dwells. The catheter exit site was secured with a 0-Prolene retention suture. The venotomy incision was closed with an interrupted 4-0 Vicryl, Dermabond and Steri-strips. Dressings were applied. The patient tolerated the procedure well without immediate post procedural complication. IMPRESSION: Successful placement of 19 cm tip to cuff tunneled hemodialysis catheter via the right internal jugular vein with tips terminating within the superior aspect of the right atrium. The catheter is ready for immediate use. Electronically Signed   By: Jacqulynn Cadet M.D.   On: 10/18/2021 17:21   IR US Guide Vasc Access Right  Result Date: 10/18/2021 INDICATION: Progressive renal disease in need of initiation of hemodialysis. EXAM: TUNNELED CENTRAL VENOUS HEMODIALYSIS CATHETER PLACEMENT WITH ULTRASOUND AND FLUOROSCOPIC GUIDANCE MEDICATIONS: 2 g Ancef. The antibiotic was given in an  appropriate time interval prior to skin puncture. ANESTHESIA/SEDATION: Moderate (conscious) sedation was employed during this procedure. A total of Versed 0.5 mg and Fentanyl 50 mcg was administered intravenously. Moderate Sedation Time: 13 minutes. The patient's level of consciousness and vital signs were monitored continuously by radiology nursing throughout the procedure under my direct supervision. FLUOROSCOPY TIME:  Radiation exposure index: 1 mGy reference air kerma COMPLICATIONS: None immediate. PROCEDURE: Informed written consent was obtained from the patient after a discussion of the risks, benefits, and alternatives to treatment. Questions regarding the procedure were encouraged and answered. The right neck and chest were prepped with chlorhexidine in a sterile fashion, and a sterile drape was applied covering the operative field. Maximum barrier sterile technique with sterile gowns and gloves were used for the procedure. A timeout was performed prior to the initiation of the procedure. After creating a small venotomy incision, a micropuncture kit was utilized to access the right internal jugular vein under direct, real-time ultrasound guidance after the overlying soft tissues were anesthetized with 1% lidocaine with epinephrine. Ultrasound image documentation was performed. The microwire was kinked to measure appropriate catheter length. A  stiff Glidewire was advanced to the level of the IVC and the micropuncture sheath was exchanged for a peel-away sheath. A Palindrome tunneled hemodialysis catheter measuring 19 cm from tip to cuff was tunneled in a retrograde fashion from the anterior chest wall to the venotomy incision. The catheter was then placed through the peel-away sheath with tips ultimately positioned within the superior aspect of the right atrium. Final catheter positioning was confirmed and documented with a spot radiographic image. The catheter aspirates and flushes normally. The catheter was  flushed with appropriate volume heparin dwells. The catheter exit site was secured with a 0-Prolene retention suture. The venotomy incision was closed with an interrupted 4-0 Vicryl, Dermabond and Steri-strips. Dressings were applied. The patient tolerated the procedure well without immediate post procedural complication. IMPRESSION: Successful placement of 19 cm tip to cuff tunneled hemodialysis catheter via the right internal jugular vein with tips terminating within the superior aspect of the right atrium. The catheter is ready for immediate use. Electronically Signed   By: Jacqulynn Cadet M.D.   On: 10/18/2021 17:21   DG Chest Portable 1 View  Result Date: 10/17/2021 CLINICAL DATA:  Worsening shortness of breath for 5 days. EXAM: PORTABLE CHEST 1 VIEW COMPARISON:  Chest radiograph 05/12/2021 FINDINGS: The cardiac silhouette remains enlarged. Aortic atherosclerosis is noted. Compared to the prior study, there is increased pulmonary vascular congestion with mild prominence of the interstitial markings. No airspace consolidation, sizeable pleural effusion, or pneumothorax is identified. No acute osseous abnormality is seen. IMPRESSION: Cardiomegaly with increased pulmonary vascular congestion/early interstitial edema. Electronically Signed   By: Logan Bores M.D.   On: 10/17/2021 16:17     Scheduled Meds:  amLODipine  10 mg Oral Daily   aspirin EC  81 mg Oral QPM   atorvastatin  80 mg Oral QHS   calcitRIOL  0.5 mcg Oral Daily   carvedilol  6.25 mg Oral BID WC   Chlorhexidine Gluconate Cloth  6 each Topical Q0600   clopidogrel  75 mg Oral Daily   darbepoetin (ARANESP) injection - DIALYSIS  150 mcg Intravenous Q Fri-HD   docusate sodium  100 mg Oral BID   insulin aspart  0-6 Units Subcutaneous TID WC   levothyroxine  200 mcg Oral q morning   senna  1 tablet Oral BID   Continuous Infusions:     LOS: 5 days    Time spent: 18mn  PDomenic Polite MD [facility]   10/22/2021, 9:10  AM

## 2021-10-22 NOTE — Progress Notes (Signed)
OT Cancellation Note  Patient Details Name: Tiffany [name] MRN: [id number] DOB: [birth year]   Cancelled Treatment:    Reason Eval/Treat Not Completed: Other (comment) (Pt declining therapy as she is in too much pain. Will return as schedule allows.)  [place], OTR/L Acute Rehab Office: [phone] 10/22/2021, 4:21 PM

## 2021-10-22 NOTE — Progress Notes (Signed)
Subjective:  seen on HD- goal of 3000-  tolerating well.  no particular complaints-  overwhelmed with process right now   Objective Vital signs in last 24 hours: Vitals:   10/22/21 1200 10/22/21 1230 10/22/21 1300 10/22/21 1330  BP: (!) 154/80 (!) 147/80 (!) 151/75 138/81  Pulse: (!) 51 (!) 51 (!) 53   Resp:      Temp:      TempSrc:      SpO2: 100% 98% 99% 98%  Weight:      Height:       Weight change: -3.5 kg  Intake/Output Summary (Last 24 hours) at 10/22/2021 1336 Last data filed at 10/21/2021 2329 Gross per 24 hour  Intake --  Output 200 ml  Net -200 ml    Assessment/Recommendations: Tiffany [name] is a/an 71 y.o. 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 Appreciate help.   -Dialysis again today and maintain MWF schedule while in hosp -Volume removal as tolerated with hemodialysis --Start CLIP process, likely good TCU candidate given desire for PD. Hold on AVF for now; discussed with Dr. Johnney Ou -Having some anxiety on dialysis.  Will provide Atarax 25 mg as needed   Volume Status: Appears volume overloaded.  Continue with UF on HD.  Overall improved   Hypertension: Continue norvasc 10/coreg 6.25 along with UF   Anemia of CKD: On retacrit every 3 weeks outpatient. Hgb low. Recent iron studies acceptable. Started aranesp here and received on 10/19/2021   Secondary hyperparathyroidism: Phosphorus mildly elevated improving w/ HD- no binder yet.  Continue home calcitriol but will change to OP vitamin D once we know where she will go    Uncontrolled Diabetes Mellitus Type 2 with Hyperglycemia: Management per primary     [place]: Basic Metabolic Panel: Recent Labs  Lab 10/18/21 0256 10/19/21 0956 10/20/21 0244 10/21/21 0704 10/22/21 0544  NA 145    < > 141 139 141  K 3.7   < > 3.4* 3.8 4.0  CL 110   < > 104 103 104  CO2 22   < > '24 26 28  '$ GLUCOSE 217*   < > 169* 117* 107*  BUN 126*   < > 79* 51* 53*  CREATININE 6.53*   < > 4.62* 3.70* 4.07*  CALCIUM 9.7   < > 9.3 8.8* 9.3  PHOS 5.6*  --   --   --   --    < > = values in this interval not displayed.   Liver Function Tests: Recent Labs  Lab 10/18/21 0256  AST 16  ALT 15  ALKPHOS 47  BILITOT 0.6  PROT 6.0*  ALBUMIN 3.2*   No results for input(s): "LIPASE", "AMYLASE" in the last 168 hours. No results for input(s): "AMMONIA" in the last 168 hours. CBC: Recent Labs  Lab 10/18/21 0256 10/19/21 0956 10/20/21 0244 10/21/21 0704 10/22/21 0544  WBC 3.9* 5.5 5.3 4.8 4.9  HGB 7.9* 8.3* 8.2* 8.0* 8.0*  HCT 25.1* 26.4* 25.5* 25.4* 25.3*  MCV 89.6 88.3 88.2 88.2 88.8  PLT 83* 92* 74* 64* 64*   Cardiac Enzymes: No results for input(s): "CKTOTAL", "CKMB", "CKMBINDEX", "TROPONINI" in the last 168 hours. CBG: Recent Labs  Lab 10/21/21 0803 10/21/21 1204 10/21/21 1604  10/21/21 2041 10/22/21 0825  GLUCAP 122* 135* 151* 166* 147*    Iron Studies: No results for input(s): "IRON", "TIBC", "TRANSFERRIN", "FERRITIN" in the last 72 hours. Studies/Results: No results found. Medications: Infusions:   Scheduled Medications:  amLODipine  10 mg Oral Daily   aspirin EC  81 mg Oral QPM   atorvastatin  80 mg Oral QHS   calcitRIOL  0.5 mcg Oral Daily   carvedilol  6.25 mg Oral BID WC   Chlorhexidine Gluconate Cloth  6 each Topical Q0600   clopidogrel  75 mg Oral Daily   darbepoetin (ARANESP) injection - DIALYSIS  150 mcg Intravenous Q Fri-HD   docusate sodium  100 mg Oral BID   insulin aspart  0-6 Units Subcutaneous TID WC   levothyroxine  200 mcg Oral q morning   senna  1 tablet Oral BID    have reviewed scheduled and prn medications.  Physical Exam: General:  NAD Heart: RRR Lungs: mostly clear Abdomen: soft, non tender Extremities: min edema-  obese legs Dialysis  Access: right sided TDC     10/22/2021,1:36 PM  LOS: 5 days

## 2021-10-22 NOTE — Progress Notes (Signed)
PT Cancellation Note  Patient Details Name: [name] MRN: [id number] DOB: May 20, 1950   Cancelled Treatment:    Reason Eval/Treat Not Completed: Pain limiting ability to participate  Patient noted to have bil feet swollen and rt foot tender to touch. Patient absolutely refuses to try to stand or walk. RN made aware and in to give tylenol. Will attempt to see 9/19 a.m.  [place]  Office [phone]  Tiffany [name] 10/22/2021, 4:20 PM

## 2021-10-22 NOTE — Progress Notes (Signed)
CSW received consult for assist with HD transportation for patient. CSW met with patient at bedside. Patient confirmed she needs transportation to and from HD.Patient would like transportation assist with access [place].Application completed and sent for review. Patient thanked CSW. All questions answered. No further questions reported at this time.

## 2021-10-22 NOTE — Care Management Important Message (Signed)
Important Message  Patient Details  Name: Tiffany [name] MRN: [id number] Date of Birth: November 25, 1950   Medicare Important Message Given:  Yes     Shelda Altes 10/22/2021, 9:28 AM

## 2021-10-22 NOTE — Progress Notes (Signed)
Mobility Specialist - Progress Note   10/22/21 1044  Mobility  Activity Contraindicated/medical hold   Pt was off unit. Will follow up if time permits.   Larey Seat

## 2021-10-22 NOTE — Progress Notes (Addendum)
Contacted [facility] admissions this am to inquire if pt has been medically cleared for start of care at [facility] [place] [place] response from admissions staff.   Melven [facility] Renal Navigator [phone]  Addendum at 3:50 pm: Pt has been accepted at [facility]. Pt's schedule will be Monday,Tuesday,Thursday,Friday with 12:15 chair time. Pt can start as soon as tomorrow and will need to arrive at 11:30 for first appt to complete paperwork prior to treatment. Met with pt at bedside and discussed above arrangements. Pt agreeable to plans. Schedule letter provided to pt with appt details noted. Also provided the pt the phone number to [facility] home therapies so she can call and schedule her home visit once she returns home and starts at [facility]. Update provided to attending, nephrologist, pt's RN, RN CM , and CSW via secure chat. Pt for possible d/c tomorrow pending therapy evals. Pt also inquiring about transportation options/resources. Advised RN CM and CSW via secure chat of pt's request for assistance with transportation to HD at d/c. Out-pt HD arrangements added to pt's AVS and contacted clinic to make clinic aware that pt will not start tomorrow.

## 2021-10-22 NOTE — Progress Notes (Signed)
Received patient in bed to unit.  Alert and oriented.  Informed consent signed and in chart.   Treatment initiated: 0930 Treatment completed: 1415  Patient tolerated well.  Transported back to the room  Alert, without acute distress.  Hand-off given to patient's nurse.   Access used:  Catheter Access issues: none  Total UF removed: 3 L  Medication(s) given: none Post HD VS: 160/88 p 58 R 18 Post HD weight: 105 kg   [facility]

## 2021-10-22 NOTE — Progress Notes (Signed)
New Dialysis Start   Patient identified as new dialysis start. Kidney Education packet assembled and given. Discussed the following items with patient:    Current medications and possible changes once started:  Discussed that patient's medications may change over time.  Ex; hypertension medications and diabetes medication.  Nephrologists will adjust as needed.  Fluid restrictions reviewed:  32 oz daily goal:  All liquids count; soups, ice, jello   Phosphorus and potassium: Handout given showing high potassium and phosphorus foods.  Alternative food and drink options given.  Family support:  no family present at  Outpatient Clinic Resources:  Discussed roles of Outpatient clinic  staff and advised to make a list of needs, if any, to talk with outpatient staff if needed  Care plan schedule: Informed patient of Care Plans in outpatient setting and to participate in the care plan.  An invitation would be given from outpatient clinic.   Dialysis Access Options:  Reviewed access options with patients. Discussed in detail about care at home with new AVG & AVF. Reviewed checking bruit and thrill. If dialysis catheter present, educated that patient could not take showers.  Catheter dressing changes were to be done by outpatient clinic staff only  Home therapy options:  Educated patient about home therapy options:  PD vs home hemo.  Patient is interested in PD and is in the process of being clipped to [place].   Patient verbalized understanding. Will continue to round on patient during admission.    Lilia Argue, RN

## 2021-10-22 NOTE — Procedures (Signed)
Patient was seen on dialysis and the procedure was supervised.  BFR 300  Via TDC BP is  156/87.   Patient appears to be tolerating treatment well  Tiffany [name] 10/22/2021

## 2021-10-23 DIAGNOSIS — I10 Essential (primary) hypertension: Secondary | ICD-10-CM

## 2021-10-23 DIAGNOSIS — E1169 Type 2 diabetes mellitus with other specified complication: Secondary | ICD-10-CM | POA: Diagnosis not present

## 2021-10-23 DIAGNOSIS — N185 Chronic kidney disease, stage 5: Secondary | ICD-10-CM

## 2021-10-23 DIAGNOSIS — G4733 Obstructive sleep apnea (adult) (pediatric): Secondary | ICD-10-CM

## 2021-10-23 DIAGNOSIS — E039 Hypothyroidism, unspecified: Secondary | ICD-10-CM

## 2021-10-23 DIAGNOSIS — I5032 Chronic diastolic (congestive) heart failure: Secondary | ICD-10-CM | POA: Diagnosis not present

## 2021-10-23 DIAGNOSIS — E785 Hyperlipidemia, unspecified: Secondary | ICD-10-CM

## 2021-10-23 LAB — GLUCOSE, CAPILLARY
Glucose-Capillary: 174 mg/dL — ABNORMAL HIGH (ref 70–99)
Glucose-Capillary: 141 mg/dL — ABNORMAL HIGH (ref 70–99)
Glucose-Capillary: 163 mg/dL — ABNORMAL HIGH (ref 70–99)
Glucose-Capillary: 207 mg/dL — ABNORMAL HIGH (ref 70–99)

## 2021-10-23 MED ORDER — CHLORHEXIDINE GLUCONATE CLOTH 2 % EX PADS
6.0000 | MEDICATED_PAD | Freq: Every day | CUTANEOUS | Status: DC
  Administered 2021-10-24 – 2021-10-25 (×2): 6 via TOPICAL

## 2021-10-23 MED ORDER — COLCHICINE 0.6 MG PO TABS
0.6000 mg | ORAL_TABLET | ORAL | Status: DC | PRN
  Administered 2021-10-23 – 2021-10-25 (×3): 0.6 mg via ORAL
  Filled 2021-10-23 (×3): qty 1

## 2021-10-23 NOTE — NC FL2 (Signed)
[place] MEDICAID FL2 LEVEL OF CARE SCREENING TOOL     IDENTIFICATION  Patient Name: Tiffany [name] Birthdate: 1950/06/27 Sex: female Admission Date (Current Location): 10/17/2021  [facility] and [place] Number:  Herbalist and Address:  [facility] [place], [place] [address], [place], [place] [address]      Provider Number: [id number]  Attending Physician Name and Address:  Tawni Millers  Relative Name and Phone Number:       Current Level of Care: Hospital Recommended Level of Care: [place] Prior Approval Number:    Date Approved/Denied:   PASRR Number: [id number]  Discharge Plan: SNF    Current Diagnoses: Patient Active Problem List   Diagnosis Date Noted   Pulmonary edema 10/17/2021   STEMI (ST elevation myocardial infarction) ([place]) 05/12/2021   Volume overload 09/30/2020   Symptomatic anemia 09/29/2020   ESRD (end stage renal disease) ([place]) 06/04/2020   Acute on chronic diastolic (congestive) heart failure ([place]) 04/26/2020   Type 2 diabetes mellitus with stage 5 chronic kidney disease ([place]) 04/26/2020   Acute CHF (congestive heart failure) ([place]) 02/01/2020   S/P left TKA 07/01/2019   Status post total left knee replacement 07/01/2019   Hyperlipidemia 09/12/2016   Type 2 diabetes mellitus without complications ([place]) 56/21/3086   Tachycardia 08/26/2016   Palpitation 08/26/2016   SOB (shortness of breath) 08/26/2016   CKD (chronic kidney disease), stage V ([place]) 04/28/2014   Chronic diastolic heart failure ([place]) 05/05/2013   Essential hypertension 05/05/2013   HEART MURMUR, SYSTOLIC 57/84/6962   Hypothyroidism 02/12/2007   OBESITY 01/08/2007   Obstructive sleep apnea 01/08/2007   Seasonal and perennial allergic rhinitis 01/08/2007    Orientation RESPIRATION BLADDER Height & Weight     Self, Time, Situation, Place  Normal Continent Weight: 233 lb 11 oz (106 kg) Height:  '5\' 9"'$  (175.3 cm)  BEHAVIORAL  SYMPTOMS/MOOD NEUROLOGICAL BOWEL NUTRITION STATUS      Continent Diet (Please see discharge summary)  AMBULATORY STATUS COMMUNICATION OF NEEDS Skin   Extensive Assist Verbally Normal                       Personal Care Assistance Level of Assistance  Bathing, Feeding, Dressing Bathing Assistance: Maximum assistance Feeding assistance: Independent Dressing Assistance: Maximum assistance     Functional Limitations Info  Sight, Hearing, Speech Sight Info:  (wears glasses) Hearing Info: Adequate Speech Info: Adequate    SPECIAL CARE FACTORS FREQUENCY  PT (By licensed PT), OT (By licensed OT)     PT Frequency: 5x min weekly OT Frequency: 5x  min weekly            Contractures Contractures Info: Not present    Additional Factors Info  Code Status, Allergies, Insulin Sliding Scale Code Status Info: DNR Allergies Info: Other (no blood products ),Sulfa Antibiotics   Insulin Sliding Scale Info: insulin aspart (novoLOG) injection 0-6 Units 3 times daily with meals,       Current Medications (10/23/2021):  This is the current hospital active medication list Current Facility-Administered Medications  Medication Dose Route Frequency Provider Last Rate Last Admin   acetaminophen (TYLENOL) tablet 650 mg  650 mg Oral Q6H PRN Shawna Clamp, MD   650 mg at 10/23/21 1031   Or   acetaminophen (TYLENOL) suppository 650 mg  650 mg Rectal Q6H PRN Shawna Clamp, MD       albuterol (PROVENTIL) (2.5 MG/3ML) 0.083% nebulizer solution 3 mL  3 mL  Inhalation Q6H PRN Shawna Clamp, MD       amLODipine (NORVASC) tablet 10 mg  10 mg Oral Daily Domenic Polite, MD   10 mg at 10/23/21 0946   aspirin EC tablet 81 mg  81 mg Oral QPM Shawna Clamp, MD   81 mg at 10/22/21 1844   atorvastatin (LIPITOR) tablet 80 mg  80 mg Oral QHS Shawna Clamp, MD   80 mg at 10/22/21 2100   calcitRIOL (ROCALTROL) capsule 0.5 mcg  0.5 mcg Oral Daily Reesa Chew, MD   0.5 mcg at 10/23/21 0946   carvedilol  (COREG) tablet 6.25 mg  6.25 mg Oral BID WC Domenic Polite, MD   6.25 mg at 10/23/21 0946   Chlorhexidine Gluconate Cloth 2 % PADS 6 each  6 each Topical Q0600 Reesa Chew, MD   6 each at 10/23/21 0546   clopidogrel (PLAVIX) tablet 75 mg  75 mg Oral Daily Shawna Clamp, MD   75 mg at 10/23/21 0946   colchicine tablet 0.6 mg  0.6 mg Oral Q3H PRN Lasya Parish, MD       Darbepoetin Alfa (ARANESP) injection 150 mcg  150 mcg Intravenous Q Fri-HD Reesa Chew, MD   150 mcg at 10/19/21 1517   hydrALAZINE (APRESOLINE) injection 5 mg  5 mg Intravenous Q4H PRN Rise Patience, MD   5 mg at 10/20/21 2037   hydrOXYzine (ATARAX) tablet 25 mg  25 mg Oral Daily PRN Reesa Chew, MD   25 mg at 10/22/21 1011   insulin aspart (novoLOG) injection 0-6 Units  0-6 Units Subcutaneous TID WC Shawna Clamp, MD   1 Units at 10/23/21 0947   levothyroxine (SYNTHROID) tablet 200 mcg  200 mcg Oral q morning Shawna Clamp, MD   200 mcg at 10/23/21 0545   ondansetron (ZOFRAN) tablet 4 mg  4 mg Oral Q6H PRN Shawna Clamp, MD       Or   ondansetron [facility]) injection 4 mg  4 mg Intravenous Q6H PRN Shawna Clamp, MD       senna Donavan Burnet) tablet 8.6 mg  1 tablet Oral BID Shawna Clamp, MD   8.6 mg at 10/22/21 2100     Discharge Medications: Please see discharge summary for a list of discharge medications.  Relevant Imaging Results:  Relevant Lab Results:   Additional Information [phone], Both Covid Vaccines, HD  Milas Gain, LCSWA

## 2021-10-23 NOTE — Assessment & Plan Note (Signed)
Continue with levothyroxine  

## 2021-10-23 NOTE — Assessment & Plan Note (Signed)
Preserved LV systolic function,  Plan to continue with amlodipine and carvedilol for blood pressure control. Continue ultrafiltration for volume management.

## 2021-10-23 NOTE — Assessment & Plan Note (Signed)
Continue blood pressure control with amlodipine and carvedilol.

## 2021-10-23 NOTE — Progress Notes (Signed)
  Progress Note   Patient: Tiffany [name] YQM:[id number] DOB: 1950-02-14 DOA: 10/17/2021     6 DOS: the patient was seen and examined on 10/23/2021   Brief hospital course: 71/F with history of CKD 4/5, chronic diastolic CHF, hypertension, iron deficiency anemia, type 2 diabetes mellitus, OSA, CAD, sciatica presented to the ED with worsening fatigue shortness of breath, has had progressive kidney disease for a few months, recently saw her cardiologist and agreed to start HD, reports orthopnea.  Worsening lethargy after starting Lyrica for her chronic sciatica pain. -In the ED labs noted potassium of 3.9, bicarb 22, creatinine 6.1, BNP 862, hemoglobin 8.8, chest x-ray with cardiomegaly, pulmonary vascular congestion and early interstitial edema -Right IJ HD cath placed 9/14 and started hemodialysis    Assessment and Plan: * CKD (chronic kidney disease), stage V ([place]) Patient with progressive renal disease, no ESRD She has a tunneled HD catheter in the right IJ  Plan to continue renal replacement therapy per nephrology recommendations.  Will need outpatient HD unit.   Anemia of chronic renal disease, continue with EPO.   Chronic diastolic heart failure (HCC) Preserved LV systolic function,  Plan to continue with amlodipine and carvedilol for blood pressure control. Continue ultrafiltration for volume management.   Essential hypertension Continue blood pressure control with amlodipine and carvedilol.   Type 2 diabetes mellitus with hyperlipidemia (HCC) Continue glucose cover and monitoring with insulin sliding scale.  Patient is tolerating po well.   Hypothyroidism Continue with levothyroxine   OBESITY Calculated BMI 34,1  Osteoarthritis, patient with pain in right knee and right foot,. Started on colchicine for possible gout.         Subjective: Patient with no chest pain or dyspnea, edema has been improving, positive pain at the right knee and right ankle   Physical  Exam: Vitals:   10/22/21 1658 10/22/21 2000 10/23/21 0537 10/23/21 0946  BP:  136/70 (!) 172/80 (!) 154/76  Pulse:  60 (!) 55 63  Resp: '15 16 18 20  '$ Temp:  97.8 F (36.6 C) 98.4 F (36.9 C) 98.8 F (37.1 C)  TempSrc:  Oral Oral Oral  SpO2:  100% 98% 97%  Weight:   106 kg   Height:       Neurology awake and alert ENT with mild pallor Cardiovascular with S1 and S2 present and rhythmic Respiratory with no rales Abdomen with no distention Trace non pitting lower extremity edema.  No increased local temperature or tender to palpation ankles or knees  Data Reviewed:    Family Communication: no family at the bedside   Disposition: Status is: Inpatient Remains inpatient appropriate because: heart failure   Planned Discharge Destination: Skilled nursing facility      Author: Tawni Millers, MD 10/23/2021 5:15 PM  For on call review [email or web address].

## 2021-10-23 NOTE — Hospital Course (Signed)
Mrs. Overacker was admitted to the hospital with the working diagnosis of worsening renal failure.   71/F with history of CKD 4/5, chronic diastolic CHF, hypertension, iron deficiency anemia, type 2 diabetes mellitus, OSA, CAD, sciatica presented to the ED with worsening fatigue shortness of breath, has had progressive kidney disease for a few months, recently saw her cardiologist and agreed to start HD, reports orthopnea.  Worsening lethargy after starting Lyrica for her chronic sciatica pain. On her initial physical examination her blood pressure was 130/63, HR 50, 02 saturation 94% on room air, RR 18, lungs with no wheezing or rales, heart with S1 and S2 present with no gallops, positive murmur, abdomen with no distention and positive lower extremity edema.   Na 144, K 3,8 Cl 110 bicarbonate 22, glucose 183 bun 124 cr 6,12  BNP 862  Wbc 4,8 hgb 8,8 plt 103   Chest radiograph with cardiomegaly and bilateral hilar vascular congestion with bilateral symmetric interstitial infiltrates.   EKG 52 bpm, normal axis, normal intervals, low voltage, sinus rhythm with poor R to R wave progression, with no significant ST segment or T wave changes.   Patient with progressive renal failure and nephrology was consulted.   -Right IJ HD cath placed 9/14 and started hemodialysis  09/21 patient is medically stable to transfer to SNF  Continue outpatient HD

## 2021-10-23 NOTE — Plan of Care (Signed)

## 2021-10-23 NOTE — Progress Notes (Signed)
PT Cancellation Note  Patient Details Name: Tiffany [name] MRN: [id number] DOB: 1950/05/21   Cancelled Treatment:    Reason Eval/Treat Not Completed: Pain limiting ability to participate. Pt continues to have significant rt foot pain. Had pain meds earlier and was able to get up into chair with nurse tech. Pt needs a break and I will check back later this AM.   [place] 10/23/2021, 9:40 AM [place] Office [phone]

## 2021-10-23 NOTE — Progress Notes (Signed)
Subjective:  s/p HD yest-  removed 3000-  tolerated well.  Plan had been to discharge plan-  found out that she is max assist with mobility so will need to go to snf :(   she says due to acute pain in right foot -  started in her right knee-  could it be gout ??    Objective Vital signs in last 24 hours: Vitals:   10/22/21 1658 10/22/21 2000 10/23/21 0537 10/23/21 0946  BP:  136/70 (!) 172/80 (!) 154/76  Pulse:  60 (!) 55 63  Resp: '15 16 18 20  '$ Temp:  97.8 F (36.6 C) 98.4 F (36.9 C) 98.8 F (37.1 C)  TempSrc:  Oral Oral Oral  SpO2:  100% 98% 97%  Weight:   106 kg   Height:       Weight change: -3.1 kg  Intake/Output Summary (Last 24 hours) at 10/23/2021 1205 Last data filed at 10/22/2021 1415 Gross per 24 hour  Intake --  Output 3000 ml  Net -3000 ml    Assessment/Recommendations:  71 y.o. 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 Appreciate help.   -Dialysis yest  and maintain MWF schedule while in hosp-  next treatment tomorrow  -Volume removal as tolerated with hemodialysis --Start CLIP process, likely good TCU candidate given desire for PD ( but now since going to SNF cannot go to TCU). Hold on AVF for now; discussed with Dr. Johnney Ou-   not sure if we need to rethink this plan given poor mobility     Volume Status:  volume overloaded.  Continue with UF on HD.  Overall improved   Hypertension: Continue norvasc 10/coreg 6.25 along with UF   Anemia of CKD: On retacrit every 3 weeks outpatient. Hgb low. Recent iron studies acceptable. Started aranesp here and received on 10/19/2021   Secondary hyperparathyroidism: Phosphorus mildly elevated improving w/ HD- no binder yet.  Continue home calcitriol but will change to OP vitamin D once we know where she will go    Uncontrolled  Diabetes Mellitus Type 2 with Hyperglycemia: Management per primary  Right foot pain-  ? Gout-  will try some colchicine-  uric acid not elevated at 4,6      [place]: Basic Metabolic Panel: Recent Labs  Lab 10/18/21 0256 10/19/21 0956 10/20/21 0244 10/21/21 0704 10/22/21 0544  NA 145   < > 141 139 141  K 3.7   < > 3.4* 3.8 4.0  CL 110   < > 104 103 104  CO2 22   < > '24 26 28  '$ GLUCOSE 217*   < > 169* 117* 107*  BUN 126*   < > 79* 51* 53*  CREATININE 6.53*   < > 4.62* 3.70* 4.07*  CALCIUM 9.7   < > 9.3 8.8* 9.3  PHOS 5.6*  --   --   --   --    < > = values in this interval not displayed.   Liver Function Tests: Recent Labs  Lab 10/18/21 0256  AST 16  ALT 15  ALKPHOS 47  BILITOT 0.6  PROT 6.0*  ALBUMIN 3.2*   No results for input(s): "LIPASE", "AMYLASE" in the last 168 hours. No results for input(s): "AMMONIA" in  the last 168 hours. CBC: Recent Labs  Lab 10/18/21 0256 10/19/21 0956 10/20/21 0244 10/21/21 0704 10/22/21 0544  WBC 3.9* 5.5 5.3 4.8 4.9  HGB 7.9* 8.3* 8.2* 8.0* 8.0*  HCT 25.1* 26.4* 25.5* 25.4* 25.3*  MCV 89.6 88.3 88.2 88.2 88.8  PLT 83* 92* 74* 64* 64*   Cardiac Enzymes: No results for input(s): "CKTOTAL", "CKMB", "CKMBINDEX", "TROPONINI" in the last 168 hours. CBG: Recent Labs  Lab 10/22/21 0825 10/22/21 1630 10/22/21 2107 10/23/21 0819 10/23/21 1200  GLUCAP 147* 142* 138* 174* 141*    Iron Studies: No results for input(s): "IRON", "TIBC", "TRANSFERRIN", "FERRITIN" in the last 72 hours. Studies/Results: DG Foot 2 Views Right  Result Date: 10/22/2021 CLINICAL DATA:  Right foot pain. EXAM: RIGHT FOOT - 2 VIEW COMPARISON:  Right foot radiograph dated 07/15/2011. FINDINGS: There is no acute fracture or dislocation. The bones are osteopenic. There is degenerative and Charcot changes of the tarsal metatarsal joints. There is diffuse subcutaneous edema and soft tissue swelling of the forefoot. Extensive vascular  calcification. IMPRESSION: 1. No acute fracture or dislocation. 2. Osteopenia and Charcot changes of the tarsometatarsal joint. 3. Diffuse subcutaneous edema and soft tissue swelling of the forefoot. Electronically Signed   By: Anner Crete M.D.   On: 10/22/2021 21:45   Medications: Infusions:   Scheduled Medications:  amLODipine  10 mg Oral Daily   aspirin EC  81 mg Oral QPM   atorvastatin  80 mg Oral QHS   calcitRIOL  0.5 mcg Oral Daily   carvedilol  6.25 mg Oral BID WC   Chlorhexidine Gluconate Cloth  6 each Topical Q0600   clopidogrel  75 mg Oral Daily   darbepoetin (ARANESP) injection - DIALYSIS  150 mcg Intravenous Q Fri-HD   docusate sodium  100 mg Oral BID   insulin aspart  0-6 Units Subcutaneous TID WC   levothyroxine  200 mcg Oral q morning   senna  1 tablet Oral BID    have reviewed scheduled and prn medications.  Physical Exam: General:  NAD Heart: RRR Lungs: mostly clear Abdomen: soft, non tender Extremities: min edema-  obese legs-  right foot tender to touch Dialysis Access: right sided TDC     10/23/2021,12:05 PM  LOS: 6 days

## 2021-10-23 NOTE — Discharge Instructions (Signed)
Nutrition for People on Dialysis  Protein You need a lot of protein! Choose 2 to 3 palm-sized portions of protein foods each day. These include 2-3 eggs or egg whites, fresh lean beef, lamb, veal, wild game, and "all natural" chicken, fish, pork, seafood, or [place]. Beans, edamame, lentils, nut butters, or tofu may be good options in meatless meals. Limit salty processed meats such as bacon, brats, deli meats, ham, hot dogs, and sausage.  Salt & Sodium Choose foods with less than 200 mg sodium per serving. Packaged meals should have less than 600 mg per serving. Do not add salt to foods. Instead, use herbs and spices such as garlic, onion or garlic powder, basil, oregano, paprika, pepper, or thyme. You can also flavor food with lemon, lime, vinegar, or a salt-free seasoning blend like Mrs. Dash.  Added Phosphorus Limit foods with added phosphorus (any words with "phos", such as calcium phosphate or phosphoric acid, in the ingredients).   Dairy Products Limit milk, ice cream, pudding, or yogurt to 4-8 ounces ( to 1 cup) per day or 1 slice of natural cheese (such as cheddar, mozzarella, or Swiss). You can use unfortified rice, almond, or soy milks instead. Limit processed cheeses, such as American cheese, Cheez Whiz, [place], boxed macaroni and cheese, and other cheese spreads or sauces with "phos" ingredients.   You can enjoy many foods when you have chronic kidney disease. Limiting a few others can also help you be healthy. Try these tips:  Fruits Enjoy 2-3 servings per day ( cup or 1 small fruit per serving):  Lower Potassium:   apples   applesauce   berries   canned fruit   clementine   grapes   lemon & lime   mandarin oranges   pear   pineapple   plum   Tangerine  Higher Potassium:   avocado   banana   dried fruit   kiwi   mango   most melons   nectarine   orange   papaya   peach   plantain   pomegranate   Vegetables Enjoy 2-3 servings per day (1 cup  leafy greens or  cup fresh, cooked, or canned per serving):  Lower Potassium:   asparagus   broccoli   cabbage   carrots   cauliflower   celery   corn   cucumber   eggplant   green beans   greens: collard, mustard, or turnip   kale   lettuce   okra   onion   peppers   radish   sweet or snap peas   Turnip  Higher Potassium:   artichoke   brussels sprouts   cooked chard   kohlrabi   parsnips   potato   pumpkin   rutabaga   squash, most types   sweet potatoes or yams   tomatoes or tomato sauce   zucchini  Eat the Right Kinds of Carbohydrates Choose white or whole grain breads and grains, lowersalt crackers, or snacks. Limit processed foods such as boxed mixes, biscuits, muffins, pancakes, waffles, instant hot cereals, and ready-to-eat baked goods.  Fluids If you make little urine, you may drink up to 4-6 cups of liquids a day. This includes all beverages and ice. It also includes the fluid in foods such as gelatin, ice cream, popsicles, and soup.  If you have diabetes, follow your diabetes meal and snack plan with the above diet changes. Do not use orange juice to treat low blood sugars. It is  high in potassium. Instead, choose glucose tabs or gel, or  cup regular cranberry grape, or apple juice or 7-Up or Sprite.  Renal Dietitians (RPG) 2019. May be reproduced for educational purposes. http://www.renalnutrition.org

## 2021-10-23 NOTE — Assessment & Plan Note (Addendum)
Calculated BMI 34,1

## 2021-10-23 NOTE — Assessment & Plan Note (Signed)
Patient was placed on insulin sliding scale for glucose cover and monitoring. Patient is tolerating po well.  Fasting glucose is 173   Continue with statin therapy.

## 2021-10-23 NOTE — Progress Notes (Signed)
Nutrition Education Note  RD consulted for Renal Education. Patient is new to HD. RD attached Renal Pyramid and Nutrition for People on Dialysis handouts to discharge instructions. Recommend patient discuss specific diet questions/concerns with RD at HD outpatient facility.  Current diet order is carb modified, patient is consuming approximately 100% of meals at this time. Labs and medications reviewed. No further nutrition interventions warranted at this time. RD contact information provided. If additional nutrition issues arise, please re-consult RD.  Lucas Mallow RD, LDN, CNSC Please refer to Amion for contact information.

## 2021-10-23 NOTE — Evaluation (Signed)
Occupational Therapy Evaluation Patient Details Name: Tiffany [name] MRN: [id number] DOB: [birth year] Today's Date: 10/23/2021   History of Present Illness 71 y.o. female presented in the ED 10/17/21 with generalized weakness, worsening shortness of breath. +pulmonary edema with acute on chronic CHF; IR for dialysis catheter 9/14; began hemodialysis 9/14;    PMH significant for CKD stage IV-V, chronic diastolic CHF (last echo 3/14 LVEF 65 to 70%), Essential hypertension, hyperlipidemia, iron deficiency anemia, type 2 diabetes, OSA on CPAP, CAD   Clinical Impression   Pt is typically independent in ADL and mobility. Today she is limited by pain in R foot requiring max A +2 for sit<>stand transfers. Pt educated on putting majority of weight through L foot and various techniques to facilitate transfers. She is able to reach down to L foot - but requires mod to min A for RLE ADL. She can perform most ADL in sitting position - but unable to stand without BUE support for ADL completion. Recommending SNF post-acute to maximize safety and independence in ADL and functional transfers as she lives alone and is requiring so much assist. OT will continue to follow acutely with next session to focus on OOB/standing activities as pain allows, toilet transfers and compensatory strategies for peri care/potentially AE for RLE ADL      Recommendations for follow up therapy are one component of a multi-disciplinary discharge planning process, led by the attending physician.  Recommendations may be updated based on patient status, additional functional criteria and insurance authorization.   Follow Up Recommendations  Skilled nursing-short term rehab (<3 hours/day)    Assistance Recommended at Discharge PRN  Patient can return home with the following Two people to help with walking and/or transfers;A little help with bathing/dressing/bathroom;Assistance with cooking/housework;Assist for transportation;Help with stairs  or ramp for entrance    Functional Status Assessment  Patient has had a recent decline in their functional status and demonstrates the ability to make significant improvements in function in a reasonable and predictable amount of time.  Equipment Recommendations  BSC/3in1    Recommendations for Other Services PT consult     Precautions / Restrictions Precautions Precautions: Fall;Other (comment) Precaution Comments: very painful rt foot      Mobility Bed Mobility               General bed mobility comments: Pt up in chair    Transfers Overall transfer level: Needs assistance Equipment used: Rolling walker (2 wheels) Transfers: Sit to/from Stand Sit to Stand: +2 physical assistance, Max assist           General transfer comment: Assist to bring hips up. Verbal cues for technique including  coming to edge of chair, hand placement, and using rocking momentum. Pt came up to flexed trunk position resting elbows on walker trying to unweight rt foot. Eventually able to stand more upright with hands no walker.      Balance Overall balance assessment: Needs assistance Sitting-balance support: No upper extremity supported, Feet supported Sitting balance-Leahy Scale: Good     Standing balance support: Bilateral upper extremity supported, Reliant on assistive device for balance Standing balance-Leahy Scale: Poor Standing balance comment: Walker and min guard for static standing. Use of walker for pain management more than for balance                           ADL either performed or assessed with clinical judgement   ADL Overall ADL's : Needs assistance/impaired  Eating/Feeding: Modified independent   Grooming: Modified independent;Wash/dry hands;Wash/dry face;Sitting Grooming Details (indicate cue type and reason): unable to perform in standing currently - requires seated position due to pain Upper Body Bathing: Set up   Lower Body Bathing: Minimal  assistance   Upper Body Dressing : Modified independent   Lower Body Dressing: Moderate assistance Lower Body Dressing Details (indicate cue type and reason): fro R foot only - able to perform for L Toilet Transfer: Maximal assistance;+2 for physical assistance;+2 for safety/equipment   Toileting- Clothing Manipulation and Hygiene: Maximal assistance;+2 for physical assistance;+2 for safety/equipment;Sit to/from stand       Functional mobility during ADLs: Maximal assistance;+2 for physical assistance;+2 for safety/equipment;Rolling walker (2 wheels);Cueing for sequencing General ADL Comments: R foot in WB very painful - impacting transfers     Vision Baseline Vision/History: 1 Wears glasses Ability to See in Adequate Light: 0 Adequate Patient Visual Report: No change from baseline Vision Assessment?: No apparent visual deficits     Perception     Praxis      Pertinent Vitals/Pain Pain Assessment Pain Assessment: 0-10 Pain Score: 8  Pain Location: rt foot Pain Descriptors / Indicators: Sharp, Moaning, Guarding Pain Intervention(s): Limited activity within patient's tolerance, Monitored during session, Repositioned, Premedicated before session, Other (comment) (elevation)     Hand Dominance Right   Extremity/Trunk Assessment Upper Extremity Assessment Upper Extremity Assessment: Overall WFL for tasks assessed   Lower Extremity Assessment Lower Extremity Assessment: Defer to PT evaluation RLE Deficits / Details: Limited by pain. Decr weight bearing due to pain       Communication Communication Communication: No difficulties   Cognition Arousal/Alertness: Awake/alert Behavior During Therapy: WFL for tasks assessed/performed Overall Cognitive Status: Within Functional Limits for tasks assessed                                       General Comments  VSS On RA    Exercises     Shoulder Instructions      Home Living Family/patient expects to be  discharged to:: Private residence Living Arrangements: Alone Available Help at Discharge: Other (Comment) (no one; sister works) Type of Home: House Home Access: Stairs to enter Technical brewer of Steps: 7 Entrance Stairs-Rails: Right;Left;Can reach both [place]: One level     Bathroom Shower/Tub: Teacher, early years/pre: Standard Bathroom Accessibility: No   Home Equipment: Advice worker (2 wheels);Kasandra Knudsen - single point   Additional Comments: worked in Librarian, 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      Prior Functioning/Environment Prior Level of Function : Independent/Modified Independent             Mobility Comments: recently began using cane due to weakness ADLs Comments: does not drive; orders groceries online        OT Problem List: Decreased activity tolerance;Decreased knowledge of use of DME or AE;Obesity;Pain;Increased edema      OT Treatment/Interventions: Self-care/ADL training;DME and/or AE instruction;Therapeutic activities;Patient/family education;Balance training    OT Goals(Current goals can be found in the care plan section) Acute Rehab OT Goals Patient Stated Goal: to decrease pain in R foot OT Goal Formulation: With patient Time For Goal Achievement: 11/06/21 Potential to Achieve Goals: Good ADL Goals Pt Will Perform Grooming: with modified independence;standing Pt Will Perform Upper Body Dressing: with modified independence;sitting Pt Will Perform Lower Body Dressing: with modified  independence;sit to/from stand Pt Will Transfer to Toilet: with modified independence;ambulating Pt Will Perform Toileting - Clothing Manipulation and hygiene: with modified independence;sit to/from stand  OT Frequency: Min 2X/week    Co-evaluation PT/OT/SLP Co-Evaluation/Treatment: Yes Reason for Co-Treatment: For patient/therapist safety;To address functional/ADL transfers;Other (comment) (pain limiting  participation) PT goals addressed during session: Mobility/safety with mobility;Balance;Proper use of DME        AM-PAC OT "6 Clicks" Daily Activity     Outcome Measure Help from another person eating meals?: None Help from another person taking care of personal grooming?: A Little Help from another person toileting, which includes using toliet, bedpan, or urinal?: A Lot Help from another person bathing (including washing, rinsing, drying)?: A Little Help from another person to put on and taking off regular upper body clothing?: None Help from another person to put on and taking off regular lower body clothing?: A Little 6 Click Score: 19   End of Session Equipment Utilized During Treatment: Rolling walker (2 wheels) Nurse Communication: Mobility status;Need for lift equipment (use stedy)  Activity Tolerance: Patient tolerated treatment well;Patient limited by pain Patient left: in chair;with call bell/phone within reach  OT Visit Diagnosis: Unsteadiness on feet (R26.81);Other abnormalities of gait and mobility (R26.89);Pain Pain - Right/Left: Right Pain - part of body: Ankle and joints of foot                Time: 2902-1115 OT Time Calculation (min): 23 min Charges:  OT General Charges $OT Visit: 1 Visit OT Evaluation $OT Eval Moderate Complexity: [place] OTR/L Acute Rehabilitation Services Office: [place] 10/23/2021, 12:35 PM

## 2021-10-23 NOTE — Evaluation (Signed)
Physical Therapy Evaluation Patient Details Name: Tiffany [name] MRN: [id number] DOB: 1950-11-13 Today's Date: 10/23/2021  History of Present Illness  71 y.o. female presented in the ED 10/17/21 with generalized weakness, worsening shortness of breath. +pulmonary edema with acute on chronic CHF; IR for dialysis catheter 9/14; began hemodialysis 9/14;    PMH significant for CKD stage IV-V, chronic diastolic CHF (last echo 5/17 LVEF 65 to 70%), Essential hypertension, hyperlipidemia, iron deficiency anemia, type 2 diabetes, OSA on CPAP, CAD  Clinical Impression  Pt presents to PT with severely limited mobility due to rt foot pain. Pt lives alone and does not have anyone to assist her at home. She will also need to go to outpt HD at [facility]. Currently she is requiring +2 max assist to stand and is unable to ambulate. Recommend ST-[facility]NF for further therapy and to allow rt foot to improve so she can reach fairly independent level before she returns home alone.        Recommendations for follow up therapy are one component of a multi-disciplinary discharge planning process, led by the attending physician.  Recommendations may be updated based on patient status, additional functional criteria and insurance authorization.  Follow Up Recommendations Skilled nursing-short term rehab (<3 hours/day) Can patient physically be transported by private vehicle: No    Assistance Recommended at Discharge Frequent or constant Supervision/Assistance  Patient can return home with the following  Two people to help with walking and/or transfers;A lot of help with bathing/dressing/bathroom;Assist for transportation;Help with stairs or ramp for entrance    Equipment Recommendations Wheelchair (measurements PT);Wheelchair cushion (measurements PT)  Recommendations for Other Services       Functional Status Assessment Patient has had a recent decline in their functional status and demonstrates the ability to make significant  improvements in function in a reasonable and predictable amount of time.     Precautions / Restrictions Precautions Precautions: Fall;Other (comment) Precaution Comments: very painful rt foot      Mobility  Bed Mobility               General bed mobility comments: Pt up in chair    Transfers Overall transfer level: Needs assistance Equipment used: Rolling walker (2 wheels) Transfers: Sit to/from Stand Sit to Stand: +2 physical assistance, Max assist           General transfer comment: Assist to bring hips up. Verbal cues for technique including  coming to edge of chair, hand placement, and using rocking momentum. Pt came up to flexed trunk position resting elbows on walker trying to unweight rt foot. Eventually able to stand more upright with hands no walker.    Ambulation/Gait               General Gait Details: Unable  Stairs            Wheelchair Mobility    Modified Rankin (Stroke Patients Only)       Balance Overall balance assessment: Needs assistance Sitting-balance support: No upper extremity supported, Feet supported Sitting balance-Leahy Scale: Good     Standing balance support: Bilateral upper extremity supported, Reliant on assistive device for balance Standing balance-Leahy Scale: Poor Standing balance comment: Walker and min guard for static standing. Use of walker for pain management more than for balance                             Pertinent Vitals/Pain Pain Assessment Pain Assessment: 0-10 Pain  Score: 8  Pain Location: rt foot Pain Descriptors / Indicators: Sharp, Moaning, Guarding Pain Intervention(s): Limited activity within patient's tolerance, Monitored during session, Premedicated before session, Repositioned    Home Living Family/patient expects to be discharged to:: Private residence Living Arrangements: Alone Available Help at Discharge: Other (Comment) (no one; sister works) Type of Home:  House Home Access: Stairs to enter Entrance Stairs-Rails: Right;Left;Can reach both Technical brewer of Steps: 7   [place]: One level [place]: Advice worker (2 wheels);Cane - single point      Prior Function Prior Level of Function : Independent/Modified Independent             Mobility Comments: recently began using cane due to weakness ADLs Comments: does not drive; orders groceries online     Hand Dominance   Dominant Hand: Right    Extremity/Trunk Assessment   Upper Extremity Assessment Upper Extremity Assessment: Defer to OT evaluation    Lower Extremity Assessment Lower Extremity Assessment: Generalized weakness;RLE deficits/detail RLE Deficits / Details: Limited by pain. Decr weight bearing due to pain       Communication   Communication: No difficulties  Cognition Arousal/Alertness: Awake/alert Behavior During Therapy: WFL for tasks assessed/performed Overall Cognitive Status: Within Functional Limits for tasks assessed                                          General Comments General comments (skin integrity, edema, etc.): VSS on RA    Exercises     Assessment/Plan    PT Assessment Patient needs continued PT services  PT Problem List Decreased strength;Decreased activity tolerance;Decreased balance;Decreased mobility;Pain;Obesity       PT Treatment Interventions DME instruction;Gait training;Stair training;Functional mobility training;Therapeutic activities;Therapeutic exercise;Balance training;Patient/family education    PT Goals (Current goals can be found in the Care Plan section)  Acute Rehab PT Goals Patient Stated Goal: decr pain in foot PT Goal Formulation: With patient Time For Goal Achievement: 11/06/21 Potential to Achieve Goals: Good    Frequency Min 3X/week     Co-evaluation PT/OT/SLP Co-Evaluation/Treatment: Yes Reason for Co-Treatment: For patient/therapist safety;Other  (comment) (pain management) PT goals addressed during session: Mobility/safety with mobility;Proper use of DME         AM-PAC PT "6 Clicks" Mobility  Outcome Measure Help needed turning from your back to your side while in a flat bed without using bedrails?: A Little Help needed moving from lying on your back to sitting on the side of a flat bed without using bedrails?: A Little Help needed moving to and from a bed to a chair (including a wheelchair)?: Total Help needed standing up from a chair using your arms (e.g., wheelchair or bedside chair)?: Total Help needed to walk in hospital room?: Total Help needed climbing 3-5 steps with a railing? : Total 6 Click Score: 10    End of Session   Activity Tolerance: Patient limited by pain Patient left: in chair;with call bell/phone within reach Nurse Communication: Mobility status;Need for lift equipment Charlaine Dalton may make transfer easier) PT Visit Diagnosis: Other abnormalities of gait and mobility (R26.89);Difficulty in walking, not elsewhere classified (R26.2);Pain Pain - Right/Left: Right Pain - part of body: Ankle and joints of foot    Time: 7353-2992 PT Time Calculation (min) (ACUTE ONLY): 20 min   Charges:   PT Evaluation $PT Eval Moderate Complexity: 1 Mod  [place] [facility] [place] 10/23/2021, 11:38 AM

## 2021-10-23 NOTE — Progress Notes (Signed)
Advised by CSW that pt now in need of snf for rehab. Pt not appropriate for TCU due to need to go to snf. Advised by CSW that pt will be going to [facility] and facility requesting [facility] clinic in [place]. Contacted [facility] admissions to request in-center HD chair at closest clinic to snf with availability. Will await approval/acceptance. Will assist as needed.   Melven Sartorius Renal Navigator [phone]

## 2021-10-23 NOTE — Assessment & Plan Note (Addendum)
Patient with progressive renal disease, no ESRD She has a tunneled HD catheter in the right IJ  Plan to continue renal replacement therapy per nephrology recommendations.  Continue with outpatient HD.   Anemia of chronic renal disease, continue with EPO.

## 2021-10-23 NOTE — TOC Initial Note (Addendum)
Transition of Care [facility]) - Initial/Assessment Note    Patient Details  Name: Tiffany [name] MRN: [id number] Date of Birth: Oct 22, 1950  [facility] CM/SW Contact:    Milas Gain, [place] Phone Number: 10/23/2021, 1:02 PM  Clinical Narrative:                  CSW received consult for possible SNF placement at time of discharge. CSW spoke with patient at bedside regarding PT recommendation of SNF placement at time of discharge. Patient reports she comes from home alone. Patient expressed understanding of PT recommendation and is agreeable to SNF placement at time of discharge. Patient gave CSW permission to fax out initial referral near the [place] area.CSW discussed insurance authorization process and will provide Medicare SNF ratings list with accepted SNF bed offers when available. Patient reports she has received the COVID vaccines. Patient expressed being hopeful for rehab and to feel better soon. Patient is HD.[place] renal navigator is following to assist with setting up HD clinic for patient.No further questions reported at this time. CSW to continue to follow and assist with discharge planning needs.   Update-3:20pm- CSW provided patient with SNF bed offers. Patient chose SNF placement at [facility].CSW awaiting callback from [place] with [name] to confirm SNF bed for patient.  Update- CSW received callback from [place] with [name] who confirmed SNF bed for patient. CSW informed [name] Renal Navigator who confirmed she is going to work on finding HD clinic for patient. CSW started insurance authorization for patient. Insurance authorization has been approved from 9/20-9/22 [facility] ID# [id number].   Expected Discharge Plan: Skilled Nursing Facility Barriers to Discharge: Continued Medical Work up   Patient Goals and CMS Choice Patient states their goals for this hospitalization and ongoing recovery are:: SNF CMS Medicare.gov Compare Post Acute Care list  provided to:: Patient Choice offered to / list presented to : Patient  Expected Discharge Plan and Services Expected Discharge Plan: [place] In-house Referral: Clinical Social Work     Living arrangements for the past 2 months: [place]                                      Prior Living Arrangements/Services Living arrangements for the past 2 months: Single Family Home Lives with:: Self Patient language and need for interpreter reviewed:: Yes Do you feel safe going back to the place where you live?: No   SNF  Need for Family Participation in Patient Care: Yes (Comment) Care giver support system in place?: Yes (comment)   Criminal Activity/Legal Involvement Pertinent to Current Situation/Hospitalization: No - Comment as needed  Activities of Daily Living Home Assistive Devices/Equipment: Eyeglasses, Environmental consultant (specify type), Cane (specify quad or straight), Shower chair with back ADL Screening (condition at time of admission) Patient's cognitive ability adequate to safely complete daily activities?: Yes Is the patient deaf or have difficulty hearing?: No Does the patient have difficulty seeing, even when wearing glasses/contacts?: No Does the patient have difficulty concentrating, remembering, or making decisions?: No Patient able to express need for assistance with ADLs?: Yes Does the patient have difficulty dressing or bathing?: Yes Independently performs ADLs?: No Communication: Independent Dressing (OT): Needs assistance Is this a change from baseline?: Change from baseline, expected to last >3 days Grooming: Independent Feeding: Independent Bathing: Needs assistance Is this a change from baseline?: Change from baseline, expected to last >  3 days Toileting: Needs assistance Is this a change from baseline?: Change from baseline, expected to last >3days In/Out Bed: Needs assistance Is this a change from baseline?: Change from baseline,  expected to last >3 days Walks in Home: Needs assistance Is this a change from baseline?: Change from baseline, expected to last >3 days Does the patient have difficulty walking or climbing stairs?: Yes Weakness of Legs: Both Weakness of Arms/Hands: Both  Permission Sought/Granted Permission sought to share information with : Case Manager, Family Supports, Customer service manager Permission granted to share information with : Yes, Verbal Permission Granted  Share Information with NAME: [name]  Permission granted to share info w AGENCY: SNF  Permission granted to share info w Relationship: Brother  Permission granted to share info w Contact Information: [name] [phone]  Emotional Assessment Appearance:: Appears stated age Attitude/Demeanor/Rapport: Gracious Affect (typically observed): Calm Orientation: : Oriented to Self, Oriented to Place, Oriented to  Time, Oriented to Situation Alcohol / Substance Use: Not Applicable Psych Involvement: No (comment)  Admission diagnosis:  Pulmonary edema [J81.1] Pulmonary vascular congestion [R09.89] Chronic renal failure, unspecified CKD stage [N18.9] Patient Active Problem List   Diagnosis Date Noted   Pulmonary edema 10/17/2021   STEMI (ST elevation myocardial infarction) ([place]) 05/12/2021   Volume overload 09/30/2020   Symptomatic anemia 09/29/2020   ESRD (end stage renal disease) ([place]) 06/04/2020   Acute on chronic diastolic (congestive) heart failure ([place]) 04/26/2020   Type 2 diabetes mellitus with stage 5 chronic kidney disease ([place]) 04/26/2020   Acute CHF (congestive heart failure) ([place]) 02/01/2020   S/P left TKA 07/01/2019   Status post total left knee replacement 07/01/2019   Hyperlipidemia 09/12/2016   Type 2 diabetes mellitus without complications ([place]) 69/67/8938   Tachycardia 08/26/2016   Palpitation 08/26/2016   SOB (shortness of breath) 08/26/2016   CKD (chronic kidney disease), stage V ([place]) 04/28/2014    Chronic diastolic heart failure ([place]) 05/05/2013   Essential hypertension 05/05/2013   HEART MURMUR, SYSTOLIC 11/20/5100   Hypothyroidism 02/12/2007   OBESITY 01/08/2007   Obstructive sleep apnea 01/08/2007   Seasonal and perennial allergic rhinitis 01/08/2007   PCP:  Nolene Ebbs, MD Pharmacy:   [id number] [place] [address] [place]Phone: [phone]: [phone]    Social Determinants of Health (SDOH) Interventions    Readmission Risk Interventions    05/20/2021   10:56 AM 10/04/2020    2:08 PM 05/02/2020   12:17 PM  Readmission Risk Prevention Plan  Transportation Screening Complete Complete Complete  PCP or Specialist Appt within 3-5 Days  Complete Complete  Social Work Consult for [place]/Counseling   Complete  Palliative Care Screening  Not Applicable Not Applicable  Medication Review (Press photographer Complete  Complete  PCP or Specialist appointment within 3-5 days of discharge Complete    HRI or Home Care Consult Complete    SW Recovery Care/Counseling Consult Complete    Palliative Care Screening Not [place]

## 2021-10-24 DIAGNOSIS — I10 Essential (primary) hypertension: Secondary | ICD-10-CM | POA: Diagnosis not present

## 2021-10-24 DIAGNOSIS — E1169 Type 2 diabetes mellitus with other specified complication: Secondary | ICD-10-CM | POA: Diagnosis not present

## 2021-10-24 DIAGNOSIS — I5032 Chronic diastolic (congestive) heart failure: Secondary | ICD-10-CM | POA: Diagnosis not present

## 2021-10-24 DIAGNOSIS — N185 Chronic kidney disease, stage 5: Secondary | ICD-10-CM | POA: Diagnosis not present

## 2021-10-24 LAB — BASIC METABOLIC PANEL
Anion gap: 12 (ref 5–15)
BUN: 46 mg/dL — ABNORMAL HIGH (ref 8–23)
CO2: 25 mmol/L (ref 22–32)
Calcium: 9.6 mg/dL (ref 8.9–10.3)
Chloride: 102 mmol/L (ref 98–111)
Creatinine, Ser: 3.51 mg/dL — ABNORMAL HIGH (ref 0.44–1.00)
GFR, Estimated: 13 mL/min — ABNORMAL LOW (ref >60)
Glucose, Bld: 114 mg/dL — ABNORMAL HIGH (ref 70–99)
Potassium: 4.3 mmol/L (ref 3.5–5.1)
Sodium: 139 mmol/L (ref 135–145)

## 2021-10-24 LAB — CBC
HCT: 26.2 % — ABNORMAL LOW (ref 36.0–46.0)
Hemoglobin: 7.9 g/dL — ABNORMAL LOW (ref 12.0–15.0)
MCH: 27.7 pg (ref 26.0–34.0)
MCHC: 30.2 g/dL (ref 30.0–36.0)
MCV: 91.9 fL (ref 80.0–100.0)
Platelets: 84 K/uL — ABNORMAL LOW (ref 150–400)
RBC: 2.85 MIL/uL — ABNORMAL LOW (ref 3.87–5.11)
RDW: 18.6 % — ABNORMAL HIGH (ref 11.5–15.5)
WBC: 4.9 K/uL (ref 4.0–10.5)
nRBC: 0 % (ref 0.0–0.2)

## 2021-10-24 LAB — GLUCOSE, CAPILLARY
Glucose-Capillary: 116 mg/dL — ABNORMAL HIGH (ref 70–99)
Glucose-Capillary: 107 mg/dL — ABNORMAL HIGH (ref 70–99)
Glucose-Capillary: 218 mg/dL — ABNORMAL HIGH (ref 70–99)
Glucose-Capillary: 189 mg/dL — ABNORMAL HIGH (ref 70–99)

## 2021-10-24 MED ORDER — OXYCODONE HCL 5 MG PO TABS
5.0000 mg | ORAL_TABLET | ORAL | Status: DC | PRN
  Administered 2021-10-24 – 2021-10-25 (×5): 5 mg via ORAL
  Filled 2021-10-24 (×5): qty 1

## 2021-10-24 MED ORDER — HEPARIN SODIUM (PORCINE) 1000 UNIT/ML IJ SOLN
INTRAMUSCULAR | Status: AC
  Administered 2021-10-24: 1000 [IU]
  Filled 2021-10-24: qty 4

## 2021-10-24 MED ORDER — DICLOFENAC SODIUM 1 % EX GEL
2.0000 g | Freq: Four times a day (QID) | CUTANEOUS | Status: DC
  Administered 2021-10-24 – 2021-10-25 (×5): 2 g via TOPICAL
  Filled 2021-10-24 (×2): qty 100

## 2021-10-24 NOTE — TOC Progression Note (Addendum)
Transition of Care [facility]) - Progression Note    Patient Details  Name: Tiffany [name] MRN: [id number] Date of Birth: April 29, 1950  Transition of Care [facility]) CM/SW Star, [place] Phone Number: 10/24/2021, 3:39 PM  Clinical Narrative:     Update- CSW spoke with [name] renal navigator who provided CSW with clinic and seat time for patient. CSW spoke with Loma Boston to verify if clinic days and time will work for facility. Teena informed CSW HD days for patient will need to be Monday,Wednesday,Friday. CSW informed Designer, multimedia. [name] informed CSW will follow up to see if able to get HD clinic days switched to [facility].  Patient has SNF bed at [facility] when medically ready for dc. Insurance authorization has been approved. CSW spoke with renal navigator [name] who informed CSW that Pt's case is being reviewed by [place]. Awaiting final clearance/approval. CSW will continue to follow and assist with patients dc planning needs.  Expected Discharge Plan: [place] Barriers to Discharge: Continued Medical Work up  Expected Discharge Plan and Services Expected Discharge Plan: [place] In-house Referral: Clinical Social Work     Living arrangements for the past 2 months: Single Family Home                                       Social Determinants of Health (SDOH) Interventions    Readmission Risk Interventions    05/20/2021   10:56 AM 10/04/2020    2:08 PM 05/02/2020   12:17 PM  Readmission Risk Prevention Plan  Transportation Screening Complete Complete Complete  PCP or Specialist Appt within 3-5 Days  Complete Complete  Social Work Consult for [place] Planning/Counseling   Complete  Palliative Care Screening  Not Applicable Not Applicable  Medication Review Press photographer) Complete  Complete  PCP or Specialist appointment within 3-5 days of discharge Complete    HRI  or [place] Complete    SW Recovery Care/Counseling Consult Complete    [place] Not Applicable

## 2021-10-24 NOTE — Progress Notes (Signed)
  Progress Note   Patient: Tiffany [name] [id number]:[id number] DOB: [birth year] DOA: 10/17/2021     7 DOS: the patient was seen and examined on 10/24/2021   Brief hospital course: 71/F with history of CKD 4/5, chronic diastolic CHF, hypertension, iron deficiency anemia, type 2 diabetes mellitus, OSA, CAD, sciatica presented to the ED with worsening fatigue shortness of breath, has had progressive kidney disease for a few months, recently saw her cardiologist and agreed to start HD, reports orthopnea.  Worsening lethargy after starting Lyrica for her chronic sciatica pain. -In the ED labs noted potassium of 3.9, bicarb 22, creatinine 6.1, BNP 862, hemoglobin 8.8, chest x-ray with cardiomegaly, pulmonary vascular congestion and early interstitial edema -Right IJ HD cath placed 9/14 and started hemodialysis    Assessment and Plan: * CKD (chronic kidney disease), stage V ([place]) Patient with progressive renal disease, no ESRD She has a tunneled HD catheter in the right IJ  Plan to continue renal replacement therapy per nephrology recommendations.  Will need outpatient HD unit.   Anemia of chronic renal disease, continue with EPO.   Chronic diastolic heart failure (HCC) Echocardiogram with LV systolic function 70 to 81%, severe LVH, moderate mitral valve stenosis, preserved RV systolic function, RVSP 01,7. LA with severe dilatation. Small circumferential pericardial effusion.   Plan to continue with amlodipine and carvedilol for blood pressure control. Continue ultrafiltration for volume management.   Essential hypertension Continue blood pressure control with amlodipine and carvedilol.   Type 2 diabetes mellitus with hyperlipidemia (HCC) Continue glucose cover and monitoring with insulin sliding scale.  Patient is tolerating po well.  Fasting glucose is 114   Hypothyroidism Continue with levothyroxine   OBESITY Calculated BMI 34,1  Osteoarthritis, patient with pain in right knee and right  foot,. Started on colchicine for possible gout.  Today with persistent pain, will add topical diclofenac.  Right foot radiograph with no acute fracture or dislocation, osteopenia and charcot changes of the tarsometatarsal joint.  Diffuse subcutaneous edema and soft tissue edema of the forefoot.         Subjective: Patient with no dyspnea or chest pain, continue to have pain on her right ankle   Physical Exam: Vitals:   10/24/21 0844 10/24/21 0910 10/24/21 0930 10/24/21 1000  BP: (!) 152/71 (!) 164/78  (!) 146/70  Pulse: (!) 59 (!) 58 (!) 57 63  Resp: '16 10 16 17  '$ Temp:      TempSrc:      SpO2: 93% 95% 98% 94%  Weight:      Height:       Neurology awake and alert ENT With no pallor Cardiovascular with S1 and S2 present and rhythmic Respiratory with no rales or wheezing Abdomen with no distention Positive bilateral non pitting ankle edema Right ankle tender to palpation with no local erythema or increased local temperature  Data Reviewed:    Family Communication: no family at the bedside   Disposition: Status is: Inpatient Remains inpatient appropriate because: pending outpatient HD and SNF  Planned Discharge Destination: Skilled nursing facility     Author: Tawni Millers, MD 10/24/2021 10:16 AM  For on call review [email or web address].

## 2021-10-24 NOTE — Progress Notes (Signed)
Received patient in bed to unit.  Alert and oriented.  Informed consent signed and in chart.    [facility]

## 2021-10-24 NOTE — Progress Notes (Signed)
Subjective:  seen in HD-  foot still hurts- looks like given colchicine once no relief   Objective Vital signs in last 24 hours: Vitals:   10/24/21 0930 10/24/21 1000 10/24/21 1030 10/24/21 1100  BP:  (!) 146/70 (!) 163/75 (!) 163/74  Pulse: (!) 57 63 60 (!) 57  Resp: '16 17 15 14  '$ Temp:      TempSrc:      SpO2: 98% 94% 97% 96%  Weight:      Height:       Weight change: 1.096 kg  Intake/Output Summary (Last 24 hours) at 10/24/2021 1135 Last data filed at 10/24/2021 0334 Gross per 24 hour  Intake --  Output 300 ml  Net -300 ml    Assessment/Recommendations:  71 y.o. 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  -Dialysis MWF schedule while in hosp-  next treatment Friday  -Volume removal as tolerated with hemodialysis --Start CLIP process, likely good TCU candidate given desire for PD ( but now since going to SNF cannot go to TCU). Hold on AVF for now; discussed with Dr. Johnney Ou-   not sure if we need to rethink this plan given poor mobility     Volume Status:  volume overloaded.  Continue with UF on HD.  Overall improved   Hypertension: Continue norvasc 10/coreg 6.25 along with UF   Anemia of CKD: On retacrit every 3 weeks outpatient. Hgb low. Recent iron studies acceptable. Started aranesp here and received on 10/19/2021   Secondary hyperparathyroidism: Phosphorus mildly elevated improving w/ HD- no binder yet.  Continue home calcitriol but will change to OP vitamin D once we know where she will go    Uncontrolled Diabetes Mellitus Type 2 with Hyperglycemia: Management per primary  Right foot pain-  ? Gout-  will try some colchicine-  uric acid not elevated at 4.6-  colchicine did not help-  other work up per primary service x- ray negative      Louis Meckel    Labs: Basic Metabolic Panel: Recent Labs   Lab 10/18/21 0256 10/19/21 0956 10/21/21 0704 10/22/21 0544 10/24/21 0436  NA 145   < > 139 141 139  K 3.7   < > 3.8 4.0 4.3  CL 110   < > 103 104 102  CO2 22   < > '26 28 25  '$ GLUCOSE 217*   < > 117* 107* 114*  BUN 126*   < > 51* 53* 46*  CREATININE 6.53*   < > 3.70* 4.07* 3.51*  CALCIUM 9.7   < > 8.8* 9.3 9.6  PHOS 5.6*  --   --   --   --    < > = values in this interval not displayed.   Liver Function Tests: Recent Labs  Lab 10/18/21 0256  AST 16  ALT 15  ALKPHOS 47  BILITOT 0.6  PROT 6.0*  ALBUMIN 3.2*   No results for input(s): "LIPASE", "AMYLASE" in the last 168 hours. No results for input(s): "AMMONIA" in the last 168 hours. CBC: Recent Labs  Lab 10/19/21 0956 10/20/21 0244 10/21/21 0704 10/22/21 0544 10/24/21 0846  WBC 5.5 5.3 4.8 4.9 4.9  HGB 8.3* 8.2* 8.0* 8.0* 7.9*  HCT 26.4* 25.5* 25.4* 25.3* 26.2*  MCV 88.3 88.2 88.2 88.8 91.9  PLT 92*  74* 64* 64* 84*   Cardiac Enzymes: No results for input(s): "CKTOTAL", "CKMB", "CKMBINDEX", "TROPONINI" in the last 168 hours. CBG: Recent Labs  Lab 10/23/21 0819 10/23/21 1200 10/23/21 1655 10/23/21 2105 10/24/21 0828  GLUCAP 174* 141* 163* 207* 116*    Iron Studies: No results for input(s): "IRON", "TIBC", "TRANSFERRIN", "FERRITIN" in the last 72 hours. Studies/Results: DG Foot 2 Views Right  Result Date: 10/22/2021 CLINICAL DATA:  Right foot pain. EXAM: RIGHT FOOT - 2 VIEW COMPARISON:  Right foot radiograph dated 07/15/2011. FINDINGS: There is no acute fracture or dislocation. The bones are osteopenic. There is degenerative and Charcot changes of the tarsal metatarsal joints. There is diffuse subcutaneous edema and soft tissue swelling of the forefoot. Extensive vascular calcification. IMPRESSION: 1. No acute fracture or dislocation. 2. Osteopenia and Charcot changes of the tarsometatarsal joint. 3. Diffuse subcutaneous edema and soft tissue swelling of the forefoot. Electronically Signed   By: Anner Crete M.D.   On: 10/22/2021 21:45   Medications: Infusions:   Scheduled Medications:  amLODipine  10 mg Oral Daily   aspirin EC  81 mg Oral QPM   atorvastatin  80 mg Oral QHS   calcitRIOL  0.5 mcg Oral Daily   carvedilol  6.25 mg Oral BID WC   Chlorhexidine Gluconate Cloth  6 each Topical Q0600   clopidogrel  75 mg Oral Daily   darbepoetin (ARANESP) injection - DIALYSIS  150 mcg Intravenous Q Fri-HD   diclofenac Sodium  2 g Topical QID   insulin aspart  0-6 Units Subcutaneous TID WC   levothyroxine  200 mcg Oral q morning   senna  1 tablet Oral BID    have reviewed scheduled and prn medications.  Physical Exam: General:  NAD Heart: RRR Lungs: mostly clear Abdomen: soft, non tender Extremities: min edema-  obese legs-  right foot tender to touch Dialysis Access: right sided TDC     10/24/2021,11:35 AM  LOS: 7 days

## 2021-10-24 NOTE — Progress Notes (Signed)
Treatment completed:   Patient tolerated well.  Pt awaiting transport back to the room  Alert, without acute distress.  Hand-off given to patient's nurse.   Access used: Cath, A-A, V-V Access issues: None   Medication(s) given: Tylenol at 0930   10/24/21 1306  Vitals  Temp 98.7 F (37.1 C)  Pulse Rate 62  Resp 18  BP (!) 166/72  SpO2 98 %  O2 Device Room Air  Weight 103.4 kg  Type of Weight Post-Dialysis  Oxygen Therapy  Patient Activity (if Appropriate) In bed  Pulse Oximetry Type Continuous  Post Treatment  Dialyzer Clearance Clear  Duration of HD Treatment -hour(s) 3.75 hour(s)  Liters Processed 90  Fluid Removed 3000 mL  Tolerated HD Treatment Yes      [facility]

## 2021-10-24 NOTE — Progress Notes (Signed)
PT Cancellation Note  Patient Details Name: Neesha Langton MRN: [id number] DOB: May 29, 1950   Cancelled Treatment:    Reason Eval/Treat Not Completed: Patient at procedure or test/unavailable (Pt in HD.  Will return at later date.)   Alvira Philips 10/24/2021, 8:58 AM [facility] M,PT Acute Rehab Services [phone]

## 2021-10-24 NOTE — Progress Notes (Signed)
Pt's case is being reviewed by [facility]. Awaiting final clearance/approval.   Melven Sartorius Renal Navigator [phone]

## 2021-10-24 NOTE — Procedures (Signed)
Patient was seen on dialysis and the procedure was supervised.  BFR 400  Via TDC BP is  146/88.   Patient appears to be tolerating treatment well  Louis Meckel 10/24/2021

## 2021-10-25 DIAGNOSIS — M10371 Gout due to renal impairment, right ankle and foot: Secondary | ICD-10-CM

## 2021-10-25 DIAGNOSIS — M109 Gout, unspecified: Secondary | ICD-10-CM | POA: Diagnosis not present

## 2021-10-25 LAB — GLUCOSE, CAPILLARY
Glucose-Capillary: 173 mg/dL — ABNORMAL HIGH (ref 70–99)
Glucose-Capillary: 159 mg/dL — ABNORMAL HIGH (ref 70–99)
Glucose-Capillary: 136 mg/dL — ABNORMAL HIGH (ref 70–99)
Glucose-Capillary: 137 mg/dL — ABNORMAL HIGH (ref 70–99)

## 2021-10-25 MED ORDER — OXYCODONE HCL 5 MG PO TABS: 5.0000 mg | ORAL_TABLET | Freq: Four times a day (QID) | ORAL | 0 refills | Status: DC | PRN

## 2021-10-25 MED ORDER — CHLORHEXIDINE GLUCONATE CLOTH 2 % EX PADS: 6.0000 | MEDICATED_PAD | Freq: Every day | CUTANEOUS | Status: DC

## 2021-10-25 MED ORDER — CARVEDILOL 6.25 MG PO TABS: 6.2500 mg | ORAL_TABLET | Freq: Two times a day (BID) | ORAL | 0 refills | Status: DC

## 2021-10-25 MED ORDER — ALLOPURINOL 100 MG PO TABS: 100.0000 mg | ORAL_TABLET | Freq: Every day | ORAL | 0 refills | Status: AC

## 2021-10-25 MED ORDER — ALLOPURINOL 100 MG PO TABS
100.0000 mg | ORAL_TABLET | Freq: Every day | ORAL | Status: DC
  Administered 2021-10-25: 100 mg via ORAL
  Filled 2021-10-25: qty 1

## 2021-10-25 MED ORDER — PREDNISONE 20 MG PO TABS: 20.0000 mg | ORAL_TABLET | Freq: Every day | ORAL | Status: DC

## 2021-10-25 MED ORDER — PREDNISONE 20 MG PO TABS: 20.0000 mg | ORAL_TABLET | Freq: Every day | ORAL | 0 refills | Status: DC

## 2021-10-25 MED ORDER — COLCHICINE 0.6 MG PO TABS: 0.6000 mg | ORAL_TABLET | Freq: Three times a day (TID) | ORAL | Status: DC

## 2021-10-25 NOTE — Discharge Summary (Signed)
Physician Discharge Summary   Patient: Tiffany [name] MRN: [id number] DOB: 1950-11-30  Admit date:     10/17/2021  Discharge date: 10/25/21  Discharge Physician: Tawni Millers   PCP: Nolene Ebbs, MD   Recommendations at discharge:    Continue with renal replacement therapy as outpatient per hemodialysis.  Possible transition to PD as outpatient when patient back home from SNF, follow up with Dr Johnney Ou from nephrology as outpatient.  Patient has been placed on oral prednisone for 3 days for acute gout attack right foot.  Discontinue clonidine and hydralazine Carvedilol dose has been decreased Discontinue diuretics   Discharge Diagnoses: Principal Problem:   CKD (chronic kidney disease), stage V (HCC) Active Problems:   Chronic diastolic heart failure (HCC)   Essential hypertension   Type 2 diabetes mellitus with hyperlipidemia (HCC)   Hypothyroidism   OBESITY   Obstructive sleep apnea   Gout attack  Resolved Problems:   * No resolved hospital problems. [place] [facility] Course: Mrs. Mandley was admitted to the hospital with the working diagnosis of worsening renal failure.   71/F with history of CKD 4/5, chronic diastolic CHF, hypertension, iron deficiency anemia, type 2 diabetes mellitus, OSA, CAD, sciatica presented to the ED with worsening fatigue shortness of breath, has had progressive kidney disease for a few months, recently saw her cardiologist and agreed to start HD, reports orthopnea.  Worsening lethargy after starting Lyrica for her chronic sciatica pain. On her initial physical examination her blood pressure was 130/63, HR 50, 02 saturation 94% on room air, RR 18, lungs with no wheezing or rales, heart with S1 and S2 present with no gallops, positive murmur, abdomen with no distention and positive lower extremity edema.   Na 144, K 3,8 Cl 110 bicarbonate 22, glucose 183 bun 124 cr 6,12  BNP 862  Wbc 4,8 hgb 8,8 plt 103   Chest radiograph with cardiomegaly  and bilateral hilar vascular congestion with bilateral symmetric interstitial infiltrates.   EKG 52 bpm, normal axis, normal intervals, low voltage, sinus rhythm with poor R to R wave progression, with no significant ST segment or T wave changes.   Patient with progressive renal failure and nephrology was consulted.   -Right IJ HD cath placed 9/14 and started hemodialysis  09/21 patient is medically stable to transfer to SNF  Continue outpatient HD     Assessment and Plan: * CKD (chronic kidney disease), stage V ([place]) Patient with progressive renal disease, no ESRD She has a tunneled HD catheter in the right IJ  Plan to continue renal replacement therapy per nephrology recommendations.  Continue with outpatient HD.   Anemia of chronic renal disease, continue with EPO.   Chronic diastolic heart failure (HCC) Echocardiogram with LV systolic function 70 to 37%, severe LVH, moderate mitral valve stenosis, preserved RV systolic function, RVSP 16,9. LA with severe dilatation. Small circumferential pericardial effusion.   Plan to continue with amlodipine and carvedilol for blood pressure control. Continue ultrafiltration for volume management.   Essential hypertension Continue blood pressure control with amlodipine and carvedilol.   Type 2 diabetes mellitus with hyperlipidemia (HCC) Patient was placed on insulin sliding scale for glucose cover and monitoring. Patient is tolerating po well.  Fasting glucose is 173   Continue with statin therapy.   Hypothyroidism Continue with levothyroxine   OBESITY Calculated BMI 34,1  Gout attack Patient continue with right ankle pain. Very sensitive to touch. Patient had been on allopurinol in the past for gout.   Plan  to continue oral prednisone 20 mg daily for 3 days Resume allopurinol.  No colchicine due ESRD Continue with as needed oxycodone for severe pain.          Consultants: nephrology  Procedures performed: tunneled  catheter placement   Disposition: Skilled nursing facility Diet recommendation:  Cardiac and Carb modified diet DISCHARGE MEDICATION: Allergies as of 10/25/2021       Reactions   Other    No blood products   Sulfa Antibiotics Rash        Medication List     STOP taking these medications    cloNIDine 0.3 mg/24hr patch Commonly known as: CATAPRES - Dosed in mg/24 hr   hydrALAZINE 50 MG tablet Commonly known as: APRESOLINE   metolazone 5 MG tablet Commonly known as: ZAROXOLYN   pregabalin 50 MG capsule Commonly known as: LYRICA   senna 8.6 MG Tabs tablet Commonly known as: SENOKOT   torsemide 100 MG tablet Commonly known as: DEMADEX   traZODone 100 MG tablet Commonly known as: DESYREL       TAKE these medications    albuterol 108 (90 Base) MCG/ACT inhaler Commonly known as: VENTOLIN HFA Inhale 2 puffs into the lungs every 6 (six) hours as needed for wheezing or shortness of breath.   allopurinol 100 MG tablet Commonly known as: ZYLOPRIM Take 1 tablet (100 mg total) by mouth daily. Start taking on: Tiffany 22, 2023   amLODipine 10 MG tablet Commonly known as: NORVASC Take 10 mg by mouth 2 (two) times daily.   aspirin EC 81 MG tablet Take 81 mg by mouth every evening. Swallow whole.   atorvastatin 80 MG tablet Commonly known as: LIPITOR Take 1 tablet (80 mg total) by mouth at bedtime.   B-complex with vitamin C tablet Take 1 tablet by mouth in the morning.   budesonide-formoterol 160-4.5 MCG/ACT inhaler Commonly known as: SYMBICORT Inhale 2 puffs then rinse mouth, twice daily What changed:  how much to take how to take this when to take this reasons to take this additional instructions   calcitRIOL 0.5 MCG capsule Commonly known as: ROCALTROL Take 0.5 mcg by mouth in the morning.   carvedilol 6.25 MG tablet Commonly known as: COREG Take 1 tablet (6.25 mg total) by mouth 2 (two) times daily with a meal. What changed:  medication  strength how much to take   cetirizine 10 MG tablet Commonly known as: ZYRTEC Take 10 mg by mouth daily as needed for allergies.   clopidogrel 75 MG tablet Commonly known as: PLAVIX Take 1 tablet (75 mg total) by mouth daily.   Fish Oil 500 MG Caps Take 500 mg by mouth at bedtime.   fluticasone 50 MCG/ACT nasal spray Commonly known as: FLONASE Place 1 spray into both nostrils daily.   levothyroxine 200 MCG tablet Commonly known as: SYNTHROID Take 200 mcg by mouth every morning.   nitroGLYCERIN 0.4 MG SL tablet Commonly known as: NITROSTAT Place 1 tablet (0.4 mg total) under the tongue every 5 (five) minutes x 3 doses as needed for chest pain.   ondansetron 4 MG tablet Commonly known as: ZOFRAN Take 1 tablet (4 mg total) by mouth every 6 (six) hours as needed for nausea or vomiting.   oxyCODONE 5 MG immediate release tablet Commonly known as: Oxy IR/ROXICODONE Take 1 tablet (5 mg total) by mouth every 6 (six) hours as needed for severe pain (right foot pain).   predniSONE 20 MG tablet Commonly known as: DELTASONE Take 1 tablet (  20 mg total) by mouth daily with breakfast. Start taking on: Tiffany 22, 2023   PRESERVISION AREDS 2 PO Take 1 tablet by mouth in the morning.   Systane Complete 0.6 % Soln Generic drug: Propylene Glycol Place 1 drop into both eyes in the morning, at noon, in the evening, and at bedtime.        Follow-up Information     [facility], [facility] Follow up.   Why: Transitional Care Unit- Schedule is Monday/Tuesday/Thursday/Friday 12:15 chair time.  For first appointment, please arrive at 11:30 to complete paperwork prior to treatment. Contact information: [place] [address] [phone]                Discharge Exam: Filed Weights   10/24/21 0842 10/24/21 1306 10/25/21 0511  Weight: 106.4 kg 103.4 kg 100.8 kg   BP (!) 145/82 (BP Location: Left Arm)   Pulse 62   Temp 98.5 F (36.9  C) (Oral)   Resp 16   Ht 5' 9"  (1.753 m)   Wt 100.8 kg   SpO2 95%   BMI 32.81 kg/m   Neurology awake and alert ENT with mild pallor Cardiovascular with S1 and S2 present and rhythmic with no gallops, rubs or murmurs Respiratory with no rales or wheezing Abdomen with no distention  Right ankle tender to light palpation and decreased range of motion due to pain  Condition at discharge: stable  The results of significant diagnostics from this hospitalization (including imaging, microbiology, ancillary and laboratory) are listed below for reference.   Imaging Studies: DG Foot 2 Views Right  Result Date: 10/22/2021 CLINICAL DATA:  Right foot pain. EXAM: RIGHT FOOT - 2 VIEW COMPARISON:  Right foot radiograph dated 07/15/2011. FINDINGS: There is no acute fracture or dislocation. The bones are osteopenic. There is degenerative and Charcot changes of the tarsal metatarsal joints. There is diffuse subcutaneous edema and soft tissue swelling of the forefoot. Extensive vascular calcification. IMPRESSION: 1. No acute fracture or dislocation. 2. Osteopenia and Charcot changes of the tarsometatarsal joint. 3. Diffuse subcutaneous edema and soft tissue swelling of the forefoot. Electronically Signed   By: Anner Crete M.D.   On: 10/22/2021 21:45   DG CHEST PORT 1 VIEW  Result Date: 10/19/2021 CLINICAL DATA:  Shortness of breath. EXAM: PORTABLE CHEST 1 VIEW COMPARISON:  Chest x-ray dated Tiffany 13, 2023. FINDINGS: New tunneled right internal jugular dialysis catheter with the tip now in the proximal to mid SVC. Unchanged cardiomegaly and mild pulmonary vascular congestion. Similar minimal right basilar atelectasis. No focal consolidation, pleural effusion, or pneumothorax. No acute osseous abnormality. IMPRESSION: 1. New tunneled right internal jugular dialysis catheter with interval retraction of the dialysis catheter tip into the proximal to mid SVC, previously within the right atrium at placement.  Such location may inhibit adequate dialysis and catheter advancement is recommended. 2. Unchanged cardiomegaly and mild pulmonary vascular congestion. Electronically Signed   By: Titus Dubin M.D.   On: 10/19/2021 12:45   IR Fluoro Guide CV Line Right  Result Date: 10/18/2021 INDICATION: Progressive renal disease in need of initiation of hemodialysis. EXAM: TUNNELED CENTRAL VENOUS HEMODIALYSIS CATHETER PLACEMENT WITH ULTRASOUND AND FLUOROSCOPIC GUIDANCE MEDICATIONS: 2 g Ancef. The antibiotic was given in an appropriate time interval prior to skin puncture. ANESTHESIA/SEDATION: Moderate (conscious) sedation was employed during this procedure. A total of Versed 0.5 mg and Fentanyl 50 mcg was administered intravenously. Moderate Sedation Time: 13 minutes. The patient's level of consciousness and vital signs  were monitored continuously by radiology nursing throughout the procedure under my direct supervision. FLUOROSCOPY TIME:  Radiation exposure index: 1 mGy reference air kerma COMPLICATIONS: None immediate. PROCEDURE: Informed written consent was obtained from the patient after a discussion of the risks, benefits, and alternatives to treatment. Questions regarding the procedure were encouraged and answered. The right neck and chest were prepped with chlorhexidine in a sterile fashion, and a sterile drape was applied covering the operative field. Maximum barrier sterile technique with sterile gowns and gloves were used for the procedure. A timeout was performed prior to the initiation of the procedure. After creating a small venotomy incision, a micropuncture kit was utilized to access the right internal jugular vein under direct, real-time ultrasound guidance after the overlying soft tissues were anesthetized with 1% lidocaine with epinephrine. Ultrasound image documentation was performed. The microwire was kinked to measure appropriate catheter length. A stiff Glidewire was advanced to the level of the IVC and  the micropuncture sheath was exchanged for a peel-away sheath. A Palindrome tunneled hemodialysis catheter measuring 19 cm from tip to cuff was tunneled in a retrograde fashion from the anterior chest wall to the venotomy incision. The catheter was then placed through the peel-away sheath with tips ultimately positioned within the superior aspect of the right atrium. Final catheter positioning was confirmed and documented with a spot radiographic image. The catheter aspirates and flushes normally. The catheter was flushed with appropriate volume heparin dwells. The catheter exit site was secured with a 0-Prolene retention suture. The venotomy incision was closed with an interrupted 4-0 Vicryl, Dermabond and Steri-strips. Dressings were applied. The patient tolerated the procedure well without immediate post procedural complication. IMPRESSION: Successful placement of 19 cm tip to cuff tunneled hemodialysis catheter via the right internal jugular vein with tips terminating within the superior aspect of the right atrium. The catheter is ready for immediate use. Electronically Signed   By: Jacqulynn Cadet M.D.   On: 10/18/2021 17:21   IR US Guide Vasc Access Right  Result Date: 10/18/2021 INDICATION: Progressive renal disease in need of initiation of hemodialysis. EXAM: TUNNELED CENTRAL VENOUS HEMODIALYSIS CATHETER PLACEMENT WITH ULTRASOUND AND FLUOROSCOPIC GUIDANCE MEDICATIONS: 2 g Ancef. The antibiotic was given in an appropriate time interval prior to skin puncture. ANESTHESIA/SEDATION: Moderate (conscious) sedation was employed during this procedure. A total of Versed 0.5 mg and Fentanyl 50 mcg was administered intravenously. Moderate Sedation Time: 13 minutes. The patient's level of consciousness and vital signs were monitored continuously by radiology nursing throughout the procedure under my direct supervision. FLUOROSCOPY TIME:  Radiation exposure index: 1 mGy reference air kerma COMPLICATIONS: None  immediate. PROCEDURE: Informed written consent was obtained from the patient after a discussion of the risks, benefits, and alternatives to treatment. Questions regarding the procedure were encouraged and answered. The right neck and chest were prepped with chlorhexidine in a sterile fashion, and a sterile drape was applied covering the operative field. Maximum barrier sterile technique with sterile gowns and gloves were used for the procedure. A timeout was performed prior to the initiation of the procedure. After creating a small venotomy incision, a micropuncture kit was utilized to access the right internal jugular vein under direct, real-time ultrasound guidance after the overlying soft tissues were anesthetized with 1% lidocaine with epinephrine. Ultrasound image documentation was performed. The microwire was kinked to measure appropriate catheter length. A stiff Glidewire was advanced to the level of the IVC and the micropuncture sheath was exchanged for a peel-away sheath. A Palindrome tunneled  hemodialysis catheter measuring 19 cm from tip to cuff was tunneled in a retrograde fashion from the anterior chest wall to the venotomy incision. The catheter was then placed through the peel-away sheath with tips ultimately positioned within the superior aspect of the right atrium. Final catheter positioning was confirmed and documented with a spot radiographic image. The catheter aspirates and flushes normally. The catheter was flushed with appropriate volume heparin dwells. The catheter exit site was secured with a 0-Prolene retention suture. The venotomy incision was closed with an interrupted 4-0 Vicryl, Dermabond and Steri-strips. Dressings were applied. The patient tolerated the procedure well without immediate post procedural complication. IMPRESSION: Successful placement of 19 cm tip to cuff tunneled hemodialysis catheter via the right internal jugular vein with tips terminating within the superior aspect of  the right atrium. The catheter is ready for immediate use. Electronically Signed   By: Jacqulynn Cadet M.D.   On: 10/18/2021 17:21   DG Chest Portable 1 View  Result Date: 10/17/2021 CLINICAL DATA:  Worsening shortness of breath for 5 days. EXAM: PORTABLE CHEST 1 VIEW COMPARISON:  Chest radiograph 05/12/2021 FINDINGS: The cardiac silhouette remains enlarged. Aortic atherosclerosis is noted. Compared to the prior study, there is increased pulmonary vascular congestion with mild prominence of the interstitial markings. No airspace consolidation, sizeable pleural effusion, or pneumothorax is identified. No acute osseous abnormality is seen. IMPRESSION: Cardiomegaly with increased pulmonary vascular congestion/early interstitial edema. Electronically Signed   By: Logan Bores M.D.   On: 10/17/2021 16:17    Microbiology: Results for orders placed or performed during the hospital encounter of 05/11/21  Resp Panel by RT-PCR (Flu A&B, Covid) Nasopharyngeal Swab     Status: None   Collection Time: 05/11/21 11:42 PM   Specimen: Nasopharyngeal Swab; Nasopharyngeal(NP) swabs in vial transport medium  Result Value Ref Range Status   SARS Coronavirus 2 by RT PCR NEGATIVE NEGATIVE Final    Comment: (NOTE) SARS-CoV-2 target nucleic acids are NOT DETECTED.  The SARS-CoV-2 RNA is generally detectable in upper respiratory specimens during the acute phase of infection. The lowest concentration of SARS-CoV-2 viral copies this assay can detect is 138 copies/mL. A negative result does not preclude SARS-Cov-2 infection and should not be used as the sole basis for treatment or other patient management decisions. A negative result may occur with  improper specimen collection/handling, submission of specimen other than nasopharyngeal swab, presence of viral mutation(s) within the areas targeted by this assay, and inadequate number of viral copies(<138 copies/mL). A negative result must be combined with clinical  observations, patient history, and epidemiological information. The expected result is Negative.  Fact Sheet for Patients:  EntrepreneurPulse.com.au  Fact Sheet for Healthcare Providers:  IncredibleEmployment.be  This test is no t yet approved or cleared by the [place] FDA and  has been authorized for detection and/or diagnosis of SARS-CoV-2 by FDA under an Emergency Use Authorization (EUA). This EUA will remain  in effect (meaning this test can be used) for the duration of the COVID-19 declaration under Section 564(b)(1) of the Act, 21 U.S.C.section 360bbb-3(b)(1), unless the authorization is terminated  or revoked sooner.       Influenza A by PCR NEGATIVE NEGATIVE Final   Influenza B by PCR NEGATIVE NEGATIVE Final    Comment: (NOTE) The Xpert Xpress SARS-CoV-2/FLU/RSV plus assay is intended as an aid in the diagnosis of influenza from Nasopharyngeal swab specimens and should not be used as a sole basis for treatment. Nasal washings and aspirates are unacceptable for  Xpert Xpress SARS-CoV-2/FLU/RSV testing.  Fact Sheet for Patients: EntrepreneurPulse.com.au  Fact Sheet for Healthcare Providers: IncredibleEmployment.be  This test is not yet approved or cleared by the [place] FDA and has been authorized for detection and/or diagnosis of SARS-CoV-2 by FDA under an Emergency Use Authorization (EUA). This EUA will remain in effect (meaning this test can be used) for the duration of the COVID-19 declaration under Section 564(b)(1) of the Act, 21 U.S.C. section 360bbb-3(b)(1), unless the authorization is terminated or revoked.  Performed at [facility], [place] [address], [place], [place] [address]   MRSA Next Gen by PCR, Nasal     Status: None   Collection Time: 05/12/21  4:35 AM   Specimen: Nasal Mucosa; Nasal Swab  Result Value Ref Range Status   MRSA by PCR Next Gen NOT DETECTED NOT  DETECTED Final    Comment: (NOTE) The GeneXpert MRSA Assay (FDA approved for NASAL specimens only), is one component of a comprehensive MRSA colonization surveillance program. It is not intended to diagnose MRSA infection nor to guide or monitor treatment for MRSA infections. Test performance is not FDA approved in patients less than 39 years old. Performed at [facility], [place] [address], [place], [place] [address]     Labs: CBC: Recent Labs  Lab 10/19/21 [phone] 10/20/21 0244 10/21/21 0704 10/22/21 0544 10/24/21 0846  WBC 5.5 5.3 4.8 4.9 4.9  HGB 8.3* 8.2* 8.0* 8.0* 7.9*  HCT 26.4* 25.5* 25.4* 25.3* 26.2*  MCV 88.3 88.2 88.2 88.8 91.9  PLT 92* 74* 64* 64* 84*   Basic Metabolic Panel: Recent Labs  Lab 10/19/21 0956 10/20/21 0244 10/21/21 0704 10/22/21 0544 10/24/21 0436  NA 141 141 139 141 139  K 3.4* 3.4* 3.8 4.0 4.3  CL 106 104 103 104 102  CO2 23 24 26 28 25   GLUCOSE 175* 169* 117* 107* 114*  BUN 101* 79* 51* 53* 46*  CREATININE 5.54* 4.62* 3.70* 4.07* 3.51*  CALCIUM 9.5 9.3 8.8* 9.3 9.6  MG  --  1.9 1.8  --   --    Liver Function Tests: No results for input(s): "AST", "ALT", "ALKPHOS", "BILITOT", "PROT", "ALBUMIN" in the last 168 hours. CBG: Recent Labs  Lab 10/24/21 1337 10/24/21 1633 10/24/21 2109 10/25/21 0744 10/25/21 1148  GLUCAP 107* 218* 189* 173* 159*    Discharge time spent: greater than 30 minutes.  Signed: Tawni Millers, MD [facility] 10/25/2021

## 2021-10-25 NOTE — Progress Notes (Signed)
  Progress Note   Patient: Tiffany [name] UYQ:[id number] DOB: Jan 11, 1951 DOA: 10/17/2021     8 DOS: the patient was seen and examined on 10/25/2021   Brief hospital course: 71/F with history of CKD 4/5, chronic diastolic CHF, hypertension, iron deficiency anemia, type 2 diabetes mellitus, OSA, CAD, sciatica presented to the ED with worsening fatigue shortness of breath, has had progressive kidney disease for a few months, recently saw her cardiologist and agreed to start HD, reports orthopnea.  Worsening lethargy after starting Lyrica for her chronic sciatica pain. -In the ED labs noted potassium of 3.9, bicarb 22, creatinine 6.1, BNP 862, hemoglobin 8.8, chest x-ray with cardiomegaly, pulmonary vascular congestion and early interstitial edema -Right IJ HD cath placed 9/14 and started hemodialysis  09/21 patient is medically stable to transfer to SNF  Continue outpatient HD     Assessment and Plan: * CKD (chronic kidney disease), stage V ([place]) Patient with progressive renal disease, no ESRD She has a tunneled HD catheter in the right IJ  Plan to continue renal replacement therapy per nephrology recommendations.  Will need outpatient HD unit.   Anemia of chronic renal disease, continue with EPO.   Chronic diastolic heart failure (HCC) Echocardiogram with LV systolic function 70 to 63%, severe LVH, moderate mitral valve stenosis, preserved RV systolic function, RVSP 87,5. LA with severe dilatation. Small circumferential pericardial effusion.   Plan to continue with amlodipine and carvedilol for blood pressure control. Continue ultrafiltration for volume management.   Essential hypertension Continue blood pressure control with amlodipine and carvedilol.   Type 2 diabetes mellitus with hyperlipidemia (HCC) Continue glucose cover and monitoring with insulin sliding scale.  Patient is tolerating po well.  Fasting glucose is 173   Continue with statin therapy.    Hypothyroidism Continue with levothyroxine   OBESITY Calculated BMI 34,1  Gout attack Patient continue with right ankle pain. Very sensitive to touch. Patient had been on allopurinol in the past for gout.   Plan to continue colchicine and will add oral prednisone 20 mg daily for 3 days Resume allopurinol.  Continue with as needed oxycodone for severe pain.         Subjective: Patient with no dyspnea or chest pain, continue to have right ankle pain with decreased mobility   Physical Exam: Vitals:   10/24/21 1430 10/24/21 2001 10/25/21 0511 10/25/21 0525  BP: (!) 164/83 (!) 155/77 (!) 161/85 (!) 177/77  Pulse: 65 66 61   Resp: '12 20 17   '$ Temp: 99.6 F (37.6 C) 99.1 F (37.3 C) 98.7 F (37.1 C)   TempSrc:  Oral Oral   SpO2:  96% 98%   Weight:   100.8 kg   Height:       Neurology awake and alert ENT with mild pallor Cardiovascular with S1 and S2 present and rhythmic with no gallops, rubs or murmurs Respiratory with no rales or wheezing Abdomen with no distention  Right ankle tender to light palpation and decreased range of motion due to pain  Data Reviewed:    Family Communication: no family at the bedside   Disposition: Status is: Inpatient Remains inpatient appropriate because: renal failure need outpatient HD   Planned Discharge Destination: SNF    Author: Tawni Millers, MD 10/25/2021 1:25 PM  For on call review [email or web address].

## 2021-10-25 NOTE — Progress Notes (Addendum)
Pt's case has been escalated with [facility] staff in an attempt to locate a MWF chair in the [place] area. Awaiting final determination/availability.   Melven Sartorius Renal Navigator [phone]  Addendum at 12:08 pm: Advised by [employer] staff that there is no MWF chair available in [place] at this time. Update provided to attending, RN CM, and CSW via secure chat. Navigator can assist with TTS schedule if snf is willing to consider.   Addendum at 3:00 pm: Contacted by CSW to say that snf is now agreeable to TTS at [facility]. Contacted clinic and spoke to [place] to confirm chair still available. Pt's schedule will be TTS 11:25 chair time. Pt can start on Saturday and will need to arrive at 11:05 for 11:25 chair time. On Tuesday, pt will need to arrive at 10:30 to complete remaining paperwork prior to treatment. This information was provided to CSW to provide to snf. Will add to pt's AVS as well. Contacted nephrologist who confirms it is appropriate for pt to receive next HD treatment on Saturday as out-pt. Contacted renal NP to request that orders be sent to clinic. Spoke to pt via phone to go over clinic and schedule. Will text pt info as well per her request. Pt agreeable to plan.

## 2021-10-25 NOTE — Progress Notes (Signed)
Subjective:  HD yest-  removed 3 liters- tolerated well-  has a bed at [facility] but needs a MWF OP spot-  still searching -  maybe foot is a little better   Objective Vital signs in last 24 hours: Vitals:   10/24/21 1430 10/24/21 2001 10/25/21 0511 10/25/21 0525  BP: (!) 164/83 (!) 155/77 (!) 161/85 (!) 177/77  Pulse: 65 66 61   Resp: '12 20 17   '$ Temp: 99.6 F (37.6 C) 99.1 F (37.3 C) 98.7 F (37.1 C)   TempSrc:  Oral Oral   SpO2:  96% 98%   Weight:   100.8 kg   Height:       Weight change: 0.304 kg  Intake/Output Summary (Last 24 hours) at 10/25/2021 1048 Last data filed at 10/25/2021 0539 Gross per 24 hour  Intake --  Output 3750 ml  Net -3750 ml    Assessment/Recommendations:  71 y.o. 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  -Dialysis MWF schedule while in hosp-  next treatment Friday  -Volume removal as tolerated with hemodialysis --Start CLIP process, likely good TCU candidate given desire for PD ( but now since going to SNF cannot go to TCU). Hold on AVF for now; discussed with Dr. Johnney Ou-   not sure if we need to rethink this plan given poor mobility ?  She is still saying she wants the PD    Volume Status:  volume overloaded.  Continue with UF on HD.  Overall improved   Hypertension: Continue norvasc 10/coreg 6.25 along with UF   Anemia of CKD: On retacrit every 3 weeks outpatient. Hgb low. Recent iron studies acceptable. Started aranesp here and received on 10/19/2021   Secondary hyperparathyroidism: Phosphorus mildly elevated improving w/ HD- no binder yet.  Continue home calcitriol but will change to OP vitamin D once we know where she will go    Uncontrolled Diabetes Mellitus Type 2 with Hyperglycemia: Management per primary  Right foot pain-  ? Gout-  will try some colchicine-  uric acid not  elevated at 4.6-  colchicine did not help-  other work up per primary service x- ray negative      Louis Meckel    Labs: Basic Metabolic Panel: Recent Labs  Lab 10/21/21 0704 10/22/21 0544 10/24/21 0436  NA 139 141 139  K 3.8 4.0 4.3  CL 103 104 102  CO2 '26 28 25  '$ GLUCOSE 117* 107* 114*  BUN 51* 53* 46*  CREATININE 3.70* 4.07* 3.51*  CALCIUM 8.8* 9.3 9.6   Liver Function Tests: No results for input(s): "AST", "ALT", "ALKPHOS", "BILITOT", "PROT", "ALBUMIN" in the last 168 hours.  No results for input(s): "LIPASE", "AMYLASE" in the last 168 hours. No results for input(s): "AMMONIA" in the last 168 hours. CBC: Recent Labs  Lab 10/19/21 0956 10/20/21 0244 10/21/21 0704 10/22/21 0544 10/24/21 0846  WBC 5.5 5.3 4.8 4.9 4.9  HGB 8.3* 8.2* 8.0* 8.0* 7.9*  HCT 26.4* 25.5* 25.4* 25.3* 26.2*  MCV 88.3 88.2 88.2 88.8 91.9  PLT 92* 74* 64* 64* 84*   Cardiac Enzymes: No results for input(s): "CKTOTAL", "CKMB", "CKMBINDEX", "TROPONINI" in the last 168 hours. CBG: Recent Labs  Lab 10/24/21 0828 10/24/21 1337 10/24/21 1633 10/24/21 2109 10/25/21 0744  GLUCAP 116* 107* 218* 189* 173*  Iron Studies: No results for input(s): "IRON", "TIBC", "TRANSFERRIN", "FERRITIN" in the last 72 hours. Studies/Results: No results found. Medications: Infusions:   Scheduled Medications:  amLODipine  10 mg Oral Daily   aspirin EC  81 mg Oral QPM   atorvastatin  80 mg Oral QHS   calcitRIOL  0.5 mcg Oral Daily   carvedilol  6.25 mg Oral BID WC   Chlorhexidine Gluconate Cloth  6 each Topical Q0600   clopidogrel  75 mg Oral Daily   darbepoetin (ARANESP) injection - DIALYSIS  150 mcg Intravenous Q Fri-HD   diclofenac Sodium  2 g Topical QID   insulin aspart  0-6 Units Subcutaneous TID WC   levothyroxine  200 mcg Oral q morning   senna  1 tablet Oral BID    have reviewed scheduled and prn medications.  Physical Exam: General:  NAD Heart: RRR Lungs: mostly  clear Abdomen: soft, non tender Extremities: min edema-  obese legs-  right foot tender to touch Dialysis Access: right sided TDC     10/25/2021,10:48 AM  LOS: 8 days

## 2021-10-25 NOTE — Progress Notes (Signed)
Physical Therapy Treatment Patient Details Name: Tiffany [name] MRN: [id number] DOB: Jan 14, 1951 Today's Date: 10/25/2021   History of Present Illness 71 y.o. female presented in the ED 10/17/21 with generalized weakness, worsening shortness of breath. +pulmonary edema with acute on chronic CHF; IR for dialysis catheter 9/14; began hemodialysis 9/14;    PMH significant for CKD stage IV-V, chronic diastolic CHF (last echo 8/10 LVEF 65 to 70%), Essential hypertension, hyperlipidemia, iron deficiency anemia, type 2 diabetes, OSA on CPAP, CAD    PT Comments    Pt continues to have rt foot pain but it is improved some since last treatment. Able to amb a few feet in room today with walker. Continue to recommend SNF since pt lives alone and is unable to mobilize enough to take care of herself at home.    Recommendations for follow up therapy are one component of a multi-disciplinary discharge planning process, led by the attending physician.  Recommendations may be updated based on patient status, additional functional criteria and insurance authorization.  Follow Up Recommendations  Skilled nursing-short term rehab (<3 hours/day) Can patient physically be transported by private vehicle: No   Assistance Recommended at Discharge Frequent or constant Supervision/Assistance  Patient can return home with the following Two people to help with walking and/or transfers;A lot of help with bathing/dressing/bathroom;Assist for transportation;Help with stairs or ramp for entrance   Equipment Recommendations  Wheelchair (measurements PT);Wheelchair cushion (measurements PT)    Recommendations for Other Services       Precautions / Restrictions Precautions Precautions: Fall;Other (comment) Precaution Comments: very painful rt foot     Mobility  Bed Mobility               General bed mobility comments: Pt sitting EOB    Transfers Overall transfer level: Needs assistance Equipment used:  Rolling walker (2 wheels) Transfers: Sit to/from Stand Sit to Stand: Min assist, +2 safety/equipment, From elevated surface           General transfer comment: Assist to bring hips up from elevated bed.    Ambulation/Gait Ambulation/Gait assistance: Min assist Gait Distance (Feet): 5 Feet Assistive device: Rolling walker (2 wheels) Gait Pattern/deviations: Step-to pattern, Decreased stance time - right, Decreased step length - left Gait velocity: decr Gait velocity interpretation: <1.31 ft/sec, indicative of household ambulator   General Gait Details: Assist for support to unweight rt foot due to pain. Verbal cues to stay closer to walker to decr pressure on rt foot.   Stairs             Wheelchair Mobility    Modified Rankin (Stroke Patients Only)       Balance Overall balance assessment: Needs assistance Sitting-balance support: No upper extremity supported, Feet supported Sitting balance-Leahy Scale: Good     Standing balance support: Bilateral upper extremity supported, Reliant on assistive device for balance Standing balance-Leahy Scale: Poor Standing balance comment: Walker and min guard for static standing. Use of walker for pain management more than for balance                            Cognition Arousal/Alertness: Awake/alert Behavior During Therapy: WFL for tasks assessed/performed Overall Cognitive Status: Within Functional Limits for tasks assessed  Exercises      General Comments General comments (skin integrity, edema, etc.): VSS on RA      Pertinent Vitals/Pain Pain Assessment Pain Assessment: Faces Faces Pain Scale: Hurts even more Pain Location: rt foot Pain Descriptors / Indicators: Guarding Pain Intervention(s): Limited activity within patient's tolerance, Repositioned, Monitored during session    Home Living                          Prior Function             PT Goals (current goals can now be found in the care plan section) Acute Rehab PT Goals Patient Stated Goal: decr pain in foot Progress towards PT goals: Progressing toward goals    Frequency    Min 3X/week      PT Plan Current plan remains appropriate    Co-evaluation              AM-PAC PT "6 Clicks" Mobility   Outcome Measure  Help needed turning from your back to your side while in a flat bed without using bedrails?: A Little Help needed moving from lying on your back to sitting on the side of a flat bed without using bedrails?: A Little Help needed moving to and from a bed to a chair (including a wheelchair)?: Total Help needed standing up from a chair using your arms (e.g., wheelchair or bedside chair)?: Total Help needed to walk in hospital room?: Total Help needed climbing 3-5 steps with a railing? : Total 6 Click Score: 10    End of Session Equipment Utilized During Treatment: Gait belt Activity Tolerance: Patient limited by pain Patient left: in chair;with call bell/phone within reach Nurse Communication: Mobility status PT Visit Diagnosis: Other abnormalities of gait and mobility (R26.89);Difficulty in walking, not elsewhere classified (R26.2);Pain Pain - Right/Left: Right Pain - part of body: Ankle and joints of foot     Time: 1030-1049 PT Time Calculation (min) (ACUTE ONLY): 19 min  Charges:  $Gait Training: 8-22 mins                     Page Office [place] 10/25/2021, 1:24 PM

## 2021-10-25 NOTE — Assessment & Plan Note (Addendum)
Patient continue with right ankle pain. Very sensitive to touch. Patient had been on allopurinol in the past for gout.   Plan to continue oral prednisone 20 mg daily for 3 days Resume allopurinol.  No colchicine due ESRD Continue with as needed oxycodone for severe pain.

## 2021-10-25 NOTE — TOC Transition Note (Addendum)
Transition of Care [facility]) - CM/SW Discharge Note   Patient Details  Name: Tiffany [name] MRN: [id number] Date of Birth: Jan 12, 1951  [facility]) CM/SW Contact:  Milas Gain, [place] Phone Number: 10/25/2021, 4:19 PM   Clinical Narrative:     Patient will DC to: [name]   Anticipated DC date: 10/25/2021  Family notified: [name]   Transport by: Corey Harold  ?  Per MD patient ready for DC to [facility] . RN, patient, [name] Renal Navigator,patient's family, and facility notified of DC. Discharge Summary sent to facility. RN given number for report tele# [phone] RM# [phone]. DC packet on chart. DNR signed by MD attached to patients DC packet.Ambulance transport requested for patient.  CSW signing off.   Final next level of care: Skilled Nursing Facility Barriers to Discharge: No Barriers Identified   Patient Goals and CMS Choice Patient states their goals for this hospitalization and ongoing recovery are:: SNF CMS Medicare.gov Compare Post Acute Care list provided to:: Patient Choice offered to / list presented to : Patient  Discharge Placement              Patient chooses bed at:  [facility]) Patient to be transferred to facility by: [place] Name of family member notified: Charlie Patient and family notified of of transfer: 10/25/21  Discharge Plan and Services In-house Referral: Clinical Social Work                                   Social Determinants of Health (Industry) Interventions     Readmission Risk Interventions    05/20/2021   10:56 AM 10/04/2020    2:08 PM 05/02/2020   12:17 PM  Readmission Risk Prevention Plan  Transportation Screening Complete Complete Complete  PCP or Specialist Appt within 3-5 Days  Complete Complete  Social Work Consult for [place] Planning/Counseling   Complete  Palliative Care Screening  Not Applicable Not Applicable  Medication Review Press photographer) Complete  Complete  PCP or Specialist  appointment within 3-5 days of discharge Complete    HRI or [place] Complete    SW Recovery Care/Counseling Consult Complete    [place] Not Applicable

## 2021-10-25 NOTE — Progress Notes (Signed)
Patient to d/c to [facility]. Report called and given to nurse on [employer]. DNR and hard script in packet with AVR. Waiting for PTAR to pick up.Fuller [place], RN

## 2021-10-25 NOTE — TOC Progression Note (Addendum)
Transition of Care [facility]) - Progression Note    Patient Details  Name: Tiffany [name] MRN: [id number] Date of Birth: [birth year]  Transition of Care [facility]) CM/SW [place], [place] Phone Number: 10/25/2021, 10:01 AM  Clinical Narrative:     Update-2:51pm- CSW informed [place] with [facility] that tracy renal navigator informed  there is no MWF chair available in [place] area currently. [place] with [facility] confirmed they will accept  [place]. CSW provided [place] with HD clinic information for patient. Patient will start HD at [facility] on Saturday. Patient will need to arrive at 11:05am to sign main consent for treatment, chair time will be at 11:25am. On Tuesday patient will need to arrive at 10:30am to finish the rest of the paperwork, seat time at 11:25am. Patient will then start her regular scheduled seat time on Thursday arrive at 11:05am for a 11:25am chair time. Patient regular schedule for HD T,Th,Sat arrive at 11:05am for 11:25am seat time at [facility].Teena with [facility] confirmed they can accept patient today if medically ready for dc.   [place] renal navigator informed CSW Pt's case has been escalated with [facility] staff in an attempt to locate a MWF chair in the [place] area. Awaiting final determination/availability. CSW will continue to follow and assist with patients dc planning needs.  Expected Discharge Plan: [place] Barriers to Discharge: Continued Medical Work up  Expected Discharge Plan and Services Expected Discharge Plan: [place] In-house Referral: Clinical Social Work     Living arrangements for the past 2 months: Single Family Home                                       Social Determinants of Health (SDOH) Interventions    Readmission Risk Interventions    05/20/2021   10:56 AM 10/04/2020    2:08 PM 05/02/2020    12:17 PM  Readmission Risk Prevention Plan  Transportation Screening Complete Complete Complete  PCP or Specialist Appt within 3-5 Days  Complete Complete  Social Work Consult for [place] Planning/Counseling   Complete  Palliative Care Screening  Not Applicable Not Applicable  Medication Review Press photographer) Complete  Complete  PCP or Specialist appointment within 3-5 days of discharge Complete    HRI or [place] Complete    SW Recovery Care/Counseling Consult Complete    [place] Not Applicable

## 2021-10-30 ENCOUNTER — Inpatient Hospital Stay (HOSPITAL_COMMUNITY): Admission: RE | Admit: 2021-10-30 | Payer: Medicare Other | Source: Ambulatory Visit

## 2021-11-02 ENCOUNTER — Encounter (HOSPITAL_COMMUNITY): Payer: Medicare Other

## 2021-11-11 ENCOUNTER — Other Ambulatory Visit: Payer: Self-pay | Admitting: Cardiology

## 2021-11-20 ENCOUNTER — Encounter (HOSPITAL_COMMUNITY): Payer: Medicare Other

## 2021-11-23 ENCOUNTER — Encounter (HOSPITAL_COMMUNITY): Payer: Medicare Other

## 2021-11-27 ENCOUNTER — Encounter (HOSPITAL_COMMUNITY): Payer: Medicare Other

## 2021-12-24 ENCOUNTER — Other Ambulatory Visit: Payer: Self-pay | Admitting: *Deleted

## 2021-12-24 DIAGNOSIS — N185 Chronic kidney disease, stage 5: Secondary | ICD-10-CM

## 2022-01-02 ENCOUNTER — Ambulatory Visit (INDEPENDENT_AMBULATORY_CARE_PROVIDER_SITE_OTHER)
Admission: RE | Admit: 2022-01-02 | Discharge: 2022-01-02 | Disposition: A | Discharge status: Home / Self Care | Payer: Medicare Other | Source: Ambulatory Visit | Attending: Vascular Surgery | Admitting: Vascular Surgery

## 2022-01-02 ENCOUNTER — Encounter: Payer: Self-pay | Admitting: Vascular Surgery

## 2022-01-02 ENCOUNTER — Ambulatory Visit (HOSPITAL_COMMUNITY)
Admission: RE | Admit: 2022-01-02 | Discharge: 2022-01-02 | Disposition: A | Discharge status: Home / Self Care | Payer: Medicare Other | Source: Ambulatory Visit | Attending: Vascular Surgery | Admitting: Vascular Surgery

## 2022-01-02 ENCOUNTER — Other Ambulatory Visit: Payer: Self-pay

## 2022-01-02 ENCOUNTER — Ambulatory Visit: Payer: Medicare Other | Admitting: Vascular Surgery

## 2022-01-02 VITALS — BP 144/72 | HR 56 | Temp 97.9°F | Resp 20 | Ht 69.0 in | Wt 214.0 lb

## 2022-01-02 DIAGNOSIS — N186 End stage renal disease: Secondary | ICD-10-CM

## 2022-01-02 DIAGNOSIS — N185 Chronic kidney disease, stage 5: Secondary | ICD-10-CM

## 2022-01-02 NOTE — H&P (View-Only) (Signed)
Patient ID: Tiffany [name], female   DOB: [birth year], 71 y.o.   MRN: [id number]  Reason for Consult: New Patient (Initial Visit)   Referred by Nolene Ebbs, MD  Subjective:     HPI:  Tiffany [name] is a 70 y.o. female history of end-stage renal disease currently on dialysis via right IJ tunneled dialysis catheter.  No history of upper extremity surgery.  She is right-hand dominant.  She has had a transvaginal hysterectomy in the past.  She is currently not interested in peritoneal dialysis and only wants to discuss hemodialysis via her upper extremity access.  She is on aspirin and Plavix.  Past Medical History:  Diagnosis Date   ALLERGIC RHINITIS    Arthritis    CHF (congestive heart failure) (HCC)    CKD (chronic kidney disease)    COVID 01/21/2021   + HOME TEST  HAD COUGH CONGESTION AND MALAISE, OCC CONGESTION NOW   Heart murmur    at birth   Hyperlipidemia    Hypertension    Hypothyroid    Iron deficiency anemia    Obesity    OSA on CPAP    Refusal of blood transfusions as patient is [employer]hov[employer]'s Witness    Shingles 11/2020   LEFT BACK   Type 2 dm with insulin use    checks abg qday runs 125   Wears glasses    Family History  Problem Relation Age of Onset   Prostate cancer Father    Congestive Heart Failure Mother    Diabetes Sister    Other Sister        septis   Breast cancer Neg Hx    Past Surgical History:  Procedure Laterality Date   BREAST BIOPSY     left breast per pt; benign 20+ yrs ago   [place]   COLONOSCOPY WITH PROPOFOL N/A 10/24/2020   Procedure: COLONOSCOPY WITH PROPOFOL;  Surgeon: Ronnette Juniper, MD;  Location: [facility];  Service: Gastroenterology;  Laterality: N/A;   CORONARY STENT INTERVENTION N/A 05/12/2021   Procedure: CORONARY STENT INTERVENTION;  Surgeon: Early Osmond, MD;  Location: [place] [facility];  Service: Cardiovascular;  Laterality: N/A;   CORONARY/GRAFT ACUTE MI REVASCULARIZATION N/A 05/12/2021    Procedure: Coronary/Graft Acute MI Revascularization;  Surgeon: Early Osmond, MD;  Location: [place] [facility];  Service: Cardiovascular;  Laterality: N/A;   IR FLUORO GUIDE CV LINE RIGHT  10/18/2021   IR US GUIDE VASC ACCESS RIGHT  10/18/2021   KNEE SURGERY Bilateral    x 2   LEFT HEART CATH AND CORONARY ANGIOGRAPHY N/A 05/12/2021   Procedure: LEFT HEART CATH AND CORONARY ANGIOGRAPHY;  Surgeon: Early Osmond, MD;  Location: [place] [facility];  Service: Cardiovascular;  Laterality: N/A;   LUMBAR DISC SURGERY     x 2   POLYPECTOMY  10/24/2020   Procedure: POLYPECTOMY;  Surgeon: Ronnette Juniper, MD;  Location: [facility];  Service: Gastroenterology;;   REDUCTION MAMMAPLASTY Bilateral 1985   TOE SURGERY     yrs ago per pt on 02-02-2021   TOTAL ABDOMINAL HYSTERECTOMY  1997   partial hysterectomy- still has ovaries   TOTAL KNEE ARTHROPLASTY Left 07/01/2019   Procedure: TOTAL KNEE ARTHROPLASTY;  Surgeon: Paralee Cancel, MD;  Location: [facility];  Service: Orthopedics;  Laterality: Left;  70 min NO BLOOD PRODUCTS!!   TREATMENT FISTULA ANAL     yrs ago per pt 0n 02-02-2021   TRIGGER FINGER RELEASE Left 02/08/2021  Procedure: Left Ring trigger finger release, left ring finger volar ganglion cyst excision;  Surgeon: Orene Desanctis, MD;  Location: [place];  Service: Orthopedics;  Laterality: Left;  with local anesthesia    Short Social History:  Social History   Tobacco Use   Smoking status: Never   Smokeless tobacco: Never  Substance Use Topics   Alcohol use: Not Currently    Allergies  Allergen Reactions   Other     No blood products   Sulfa Antibiotics Rash    Current Outpatient Medications  Medication Sig Dispense Refill   albuterol (VENTOLIN HFA) 108 (90 Base) MCG/ACT inhaler Inhale 2 puffs into the lungs every 6 (six) hours as needed for wheezing or shortness of breath.     amLODipine (NORVASC) 10 MG tablet Take 10 mg by mouth 2 (two) times daily.     aspirin  EC 81 MG tablet Take 81 mg by mouth every evening. Swallow whole.     atorvastatin (LIPITOR) 80 MG tablet TAKE 1 TABLET BY MOUTH EVERYDAY AT BEDTIME 90 tablet 1   B Complex-C (B-COMPLEX WITH VITAMIN C) tablet Take 1 tablet by mouth in the morning.     budesonide-formoterol (SYMBICORT) 160-4.5 MCG/ACT inhaler Inhale 2 puffs then rinse mouth, twice daily (Patient taking differently: Inhale 2 puffs into the lungs 2 (two) times daily as needed (wheezing or shortness of breath).) 1 each 12   calcitRIOL (ROCALTROL) 0.5 MCG capsule Take 0.5 mcg by mouth in the morning.     carvedilol (COREG) 6.25 MG tablet Take 1 tablet (6.25 mg total) by mouth 2 (two) times daily with a meal. 90 tablet 1   cetirizine (ZYRTEC) 10 MG tablet Take 10 mg by mouth daily as needed for allergies.     clopidogrel (PLAVIX) 75 MG tablet TAKE 1 TABLET BY MOUTH EVERY DAY 90 tablet 1   fluticasone (FLONASE) 50 MCG/ACT nasal spray Place 1 spray into both nostrils daily.     levothyroxine (SYNTHROID) 200 MCG tablet Take 200 mcg by mouth every morning.     Multiple Vitamins-Minerals (PRESERVISION AREDS 2 PO) Take 1 tablet by mouth in the morning.     nitroGLYCERIN (NITROSTAT) 0.4 MG SL tablet Place 1 tablet (0.4 mg total) under the tongue every 5 (five) minutes x 3 doses as needed for chest pain. 25 tablet 2   Omega-3 Fatty Acids (FISH OIL) 500 MG CAPS Take 500 mg by mouth at bedtime.     ondansetron (ZOFRAN) 4 MG tablet Take 1 tablet (4 mg total) by mouth every 6 (six) hours as needed for nausea or vomiting. 30 tablet 0   Propylene Glycol (SYSTANE COMPLETE) 0.6 % SOLN Place 1 drop into both eyes in the morning, at noon, in the evening, and at bedtime.     allopurinol (ZYLOPRIM) 100 MG tablet Take 1 tablet (100 mg total) by mouth daily. 30 tablet 0   carvedilol (COREG) 6.25 MG tablet Take 1 tablet (6.25 mg total) by mouth 2 (two) times daily with a meal. 60 tablet 0   No current facility-administered medications for this visit.     Review of Systems  Constitutional:  Constitutional negative. HENT: HENT negative.  Eyes: Eyes negative.  Respiratory: Respiratory negative.  Cardiovascular: Cardiovascular negative.  GI: Gastrointestinal negative.  Musculoskeletal: Musculoskeletal negative.  Skin: Skin negative.  Neurological: Neurological negative. Hematologic: Hematologic/lymphatic negative.  Psychiatric: Psychiatric negative.        Objective:  Objective   Vitals:   01/02/22 1128  BP: Marland Kitchen)  144/72  Pulse: (!) 56  Resp: 20  Temp: 97.9 F (36.6 C)  SpO2: 97%  Weight: 214 lb (97.1 kg)  Height: '5\' 9"'$  (1.753 m)   Body mass index is 31.6 kg/m.  Physical Exam HENT:     Head: Normocephalic.     Nose: Nose normal.  Eyes:     Pupils: Pupils are equal, round, and reactive to light.  Neck:     Comments: Right IJ tunnel dialysis catheter without any evidence of infection. Cardiovascular:     Rate and Rhythm: Normal rate.     Pulses:          Radial pulses are 2+ on the right side and 2+ on the left side.  Pulmonary:     Effort: Pulmonary effort is normal.  Abdominal:     General: Abdomen is flat.     Palpations: Abdomen is soft.  Skin:    General: Skin is warm.     Capillary Refill: Capillary refill takes less than 2 seconds.  Neurological:     General: No focal deficit present.     Mental Status: She is alert.  Psychiatric:        Mood and Affect: Mood normal.        Thought Content: Thought content normal.     Data: Right Pre-Dialysis Findings:  +-----------------------+----------+--------------------+---------+--------  +  Location              PSV (cm/s)Intralum. Diam. (cm)Waveform  Comments  +-----------------------+----------+--------------------+---------+--------  +  Brachial Antecub. fossa119       0.62                triphasic           +-----------------------+----------+--------------------+---------+--------  +  Radial Art at Wrist    85        0.24                 triphasic           +-----------------------+----------+--------------------+---------+--------  +  Ulnar Art at Wrist     37        0.12                triphasic           +-----------------------+----------+--------------------+---------+--------  +     Left Pre-Dialysis Findings:  +-----------------------+----------+--------------------+---------+--------  +  Location              PSV (cm/s)Intralum. Diam. (cm)Waveform  Comments  +-----------------------+----------+--------------------+---------+--------  +  Brachial Antecub. fossa71        0.60                triphasic           +-----------------------+----------+--------------------+---------+--------  +  Radial Art at Wrist    65        0.30                triphasic           +-----------------------+----------+--------------------+---------+--------  +  Ulnar Art at Wrist     64        0.17                triphasic           +-----------------------+----------+--------------------+---------+--------  +     important  This result has not been signed. Information might be incomplete.   Rivka Barbara  Procedures: VAS [place] UPPER EXTREMITY VEIN MAPPING (PRE-OP AVF)  Height: Not recorded  Weight: Not  recorded  Blood Pressure: Not recorded   Accession Number: [id number]  Date of Study: 01/02/22  Ordering Provider: Waynetta Sandy, MD  Clinical Indications: Preop vein mapping       Interpreting Physicians  Performing Staff  Result is not signed. Tech: Maudry Mayhew A     VAS [place] UPPER EXT VEIN MAPPING (PRE-OP AVF) (Accession [id number]) (Order [id number]) Vascular Ultrasound Date: 01/02/2022 Department: [facility] Released By: Vara Guardian Authorizing: Waynetta Sandy, MD   Exam Status  Status  Preliminary [70]   Reason for Exam Priority: Routine Preop vein mapping  Dx: CKD (chronic kidney disease), stage  V ([place]) [N18.5 (ICD-10-CM)]   MyChart Results Release  MyChart Status: Active  Results Release   Study Result  UPPER EXTREMITY VEIN MAPPING  Patient Name:  Tiffany [name]  Date of Exam:   01/02/2022 Medical Rec #: [id number]          Accession #:    [id number] Date of Birth: [birth year]           Patient Gender: F Patient Age:   66 years Exam Location:  [facility] Procedure:      VAS [place] UPPER EXT VEIN MAPPING (PRE-OP AVF) Referring Phys: Erlene Quan Hester Joslin   --------------------------------------------------------------------------- -----   Indications: Pre-access.  Comparison Study: No prior study  Performing Technologist: Maudry Mayhew MHA, RDMS, RVT, RDCS    Examination Guidelines: A complete evaluation includes B-mode imaging, spectral Doppler, color Doppler, and power Doppler as needed of all accessible portions of each vessel. Bilateral testing is considered an integral part of a complete examination. Limited examinations for reoccurring indications may be performed as noted.  +-----------------+-------------+----------+----------+ Right Cephalic   Diameter (cm)Depth (cm) Findings  +-----------------+-------------+----------+----------+ Shoulder             0.24        1.41              +-----------------+-------------+----------+----------+ Prox upper arm       0.22        1.00              +-----------------+-------------+----------+----------+ Mid upper arm        0.21        0.65   branching  +-----------------+-------------+----------+----------+ Dist upper arm       0.29        0.28   thrombosed +-----------------+-------------+----------+----------+ Antecubital fossa    0.17        0.33   thrombosed +-----------------+-------------+----------+----------+ Prox forearm         0.28        0.28              +-----------------+-------------+----------+----------+ Mid forearm          0.25        0.35    branching  +-----------------+-------------+----------+----------+ Wrist                0.21        0.39              +-----------------+-------------+----------+----------+  +-----------------+-------------+----------+--------------+ Right Basilic    Diameter (cm)Depth (cm)   Findings    +-----------------+-------------+----------+--------------+ Mid upper arm        0.27        1.65     branching    +-----------------+-------------+----------+--------------+ Dist upper arm  not visualized +-----------------+-------------+----------+--------------+ Antecubital fossa                       not visualized +-----------------+-------------+----------+--------------+ Prox forearm                            not visualized +-----------------+-------------+----------+--------------+ Mid forearm                             not visualized +-----------------+-------------+----------+--------------+ Distal forearm                          not visualized +-----------------+-------------+----------+--------------+ Wrist                                   not visualized +-----------------+-------------+----------+--------------+  +-----------------+-------------+----------+---------+ Left Cephalic    Diameter (cm)Depth (cm)Findings  +-----------------+-------------+----------+---------+ Shoulder             0.26        1.25             +-----------------+-------------+----------+---------+ Prox upper arm       0.18        0.90             +-----------------+-------------+----------+---------+ Mid upper arm        0.23        1.00   branching +-----------------+-------------+----------+---------+ Dist upper arm       0.20        0.87   branching +-----------------+-------------+----------+---------+ Antecubital fossa    0.29        0.33              +-----------------+-------------+----------+---------+ Prox forearm         0.16        0.37   branching +-----------------+-------------+----------+---------+ Mid forearm          0.12        0.51   branching +-----------------+-------------+----------+---------+ Wrist                0.11        0.22             +-----------------+-------------+----------+---------+  +-----------------+-------------+----------+---------+ Left Basilic     Diameter (cm)Depth (cm)Findings  +-----------------+-------------+----------+---------+ Prox upper arm       0.45        1.34             +-----------------+-------------+----------+---------+ Mid upper arm        0.26        1.90             +-----------------+-------------+----------+---------+ Dist upper arm       0.21        1.48             +-----------------+-------------+----------+---------+ Antecubital fossa    0.22        0.41   branching +-----------------+-------------+----------+---------+ Prox forearm         0.15        0.68             +-----------------+-------------+----------+---------+       Assessment/Plan:     71 year old female with end-stage renal disease currently dialyzing via catheter.  She is right-hand dominant.  We discussed peritoneal dialysis versus hemodialysis and patient would rather proceed with continuing hemodialysis therapy via left  upper extremity access.  Plan will be for left arm fistula possibly graft on a nondialysis day in the near future.  She will continue aspirin and Plavix perioperatively.    Waynetta Sandy MD [facility]

## 2022-01-02 NOTE — Progress Notes (Signed)
Patient ID: Tiffany [name], female   DOB: 12/11/1950, 71 y.o.   MRN: [id number]  Reason for Consult: New Patient (Initial Visit)   Referred by Nolene Ebbs, MD  Subjective:     HPI:  Tiffany [name] is a 71 y.o. female history of end-stage renal disease currently on dialysis via right IJ tunneled dialysis catheter.  No history of upper extremity surgery.  She is right-hand dominant.  She has had a transvaginal hysterectomy in the past.  She is currently not interested in peritoneal dialysis and only wants to discuss hemodialysis via her upper extremity access.  She is on aspirin and Plavix.  Past Medical History:  Diagnosis Date   ALLERGIC RHINITIS    Arthritis    CHF (congestive heart failure) (HCC)    CKD (chronic kidney disease)    COVID 01/21/2021   + HOME TEST  HAD COUGH CONGESTION AND MALAISE, OCC CONGESTION NOW   Heart murmur    at birth   Hyperlipidemia    Hypertension    Hypothyroid    Iron deficiency anemia    Obesity    OSA on CPAP    Refusal of blood transfusions as patient is Jehovah's Witness    Shingles 11/2020   LEFT BACK   Type 2 dm with insulin use    checks abg qday runs 125   Wears glasses    Family History  Problem Relation Age of Onset   Prostate cancer Father    Congestive Heart Failure Mother    Diabetes Sister    Other Sister        septis   Breast cancer Neg Hx    Past Surgical History:  Procedure Laterality Date   BREAST BIOPSY     left breast per pt; benign 20+ yrs ago   [place]   COLONOSCOPY WITH PROPOFOL N/A 10/24/2020   Procedure: COLONOSCOPY WITH PROPOFOL;  Surgeon: Ronnette Juniper, MD;  Location: [facility];  Service: Gastroenterology;  Laterality: N/A;   CORONARY STENT INTERVENTION N/A 05/12/2021   Procedure: CORONARY STENT INTERVENTION;  Surgeon: Early Osmond, MD;  Location: [facility];  Service: Cardiovascular;  Laterality: N/A;   CORONARY/GRAFT ACUTE MI REVASCULARIZATION N/A 05/12/2021    Procedure: Coronary/Graft Acute MI Revascularization;  Surgeon: Early Osmond, MD;  Location: [place] [facility];  Service: Cardiovascular;  Laterality: N/A;   IR FLUORO GUIDE CV LINE RIGHT  10/18/2021   IR US GUIDE VASC ACCESS RIGHT  10/18/2021   KNEE SURGERY Bilateral    x 2   LEFT HEART CATH AND CORONARY ANGIOGRAPHY N/A 05/12/2021   Procedure: LEFT HEART CATH AND CORONARY ANGIOGRAPHY;  Surgeon: Early Osmond, MD;  Location: [facility];  Service: Cardiovascular;  Laterality: N/A;   LUMBAR DISC SURGERY     x 2   POLYPECTOMY  10/24/2020   Procedure: POLYPECTOMY;  Surgeon: Ronnette Juniper, MD;  Location: [facility];  Service: Gastroenterology;;   REDUCTION MAMMAPLASTY Bilateral 1985   TOE SURGERY     yrs ago per pt on 02-02-2021   TOTAL ABDOMINAL HYSTERECTOMY  1997   partial hysterectomy- still has ovaries   TOTAL KNEE ARTHROPLASTY Left 07/01/2019   Procedure: TOTAL KNEE ARTHROPLASTY;  Surgeon: Paralee Cancel, MD;  Location: [facility];  Service: Orthopedics;  Laterality: Left;  70 min NO BLOOD PRODUCTS!!   TREATMENT FISTULA ANAL     yrs ago per pt 0n 02-02-2021   TRIGGER FINGER RELEASE Left 02/08/2021  Procedure: Left Ring trigger finger release, left ring finger volar ganglion cyst excision;  Surgeon: Orene Desanctis, MD;  Location: [place];  Service: Orthopedics;  Laterality: Left;  with local anesthesia    Short Social History:  Social History   Tobacco Use   Smoking status: Never   Smokeless tobacco: Never  Substance Use Topics   Alcohol use: Not Currently    Allergies  Allergen Reactions   Other     No blood products   Sulfa Antibiotics Rash    Current Outpatient Medications  Medication Sig Dispense Refill   albuterol (VENTOLIN HFA) 108 (90 Base) MCG/ACT inhaler Inhale 2 puffs into the lungs every 6 (six) hours as needed for wheezing or shortness of breath.     amLODipine (NORVASC) 10 MG tablet Take 10 mg by mouth 2 (two) times daily.     aspirin  EC 81 MG tablet Take 81 mg by mouth every evening. Swallow whole.     atorvastatin (LIPITOR) 80 MG tablet TAKE 1 TABLET BY MOUTH EVERYDAY AT BEDTIME 90 tablet 1   B Complex-C (B-COMPLEX WITH VITAMIN C) tablet Take 1 tablet by mouth in the morning.     budesonide-formoterol (SYMBICORT) 160-4.5 MCG/ACT inhaler Inhale 2 puffs then rinse mouth, twice daily (Patient taking differently: Inhale 2 puffs into the lungs 2 (two) times daily as needed (wheezing or shortness of breath).) 1 each 12   calcitRIOL (ROCALTROL) 0.5 MCG capsule Take 0.5 mcg by mouth in the morning.     carvedilol (COREG) 6.25 MG tablet Take 1 tablet (6.25 mg total) by mouth 2 (two) times daily with a meal. 90 tablet 1   cetirizine (ZYRTEC) 10 MG tablet Take 10 mg by mouth daily as needed for allergies.     clopidogrel (PLAVIX) 75 MG tablet TAKE 1 TABLET BY MOUTH EVERY DAY 90 tablet 1   fluticasone (FLONASE) 50 MCG/ACT nasal spray Place 1 spray into both nostrils daily.     levothyroxine (SYNTHROID) 200 MCG tablet Take 200 mcg by mouth every morning.     Multiple Vitamins-Minerals (PRESERVISION AREDS 2 PO) Take 1 tablet by mouth in the morning.     nitroGLYCERIN (NITROSTAT) 0.4 MG SL tablet Place 1 tablet (0.4 mg total) under the tongue every 5 (five) minutes x 3 doses as needed for chest pain. 25 tablet 2   Omega-3 Fatty Acids (FISH OIL) 500 MG CAPS Take 500 mg by mouth at bedtime.     ondansetron (ZOFRAN) 4 MG tablet Take 1 tablet (4 mg total) by mouth every 6 (six) hours as needed for nausea or vomiting. 30 tablet 0   Propylene Glycol (SYSTANE COMPLETE) 0.6 % SOLN Place 1 drop into both eyes in the morning, at noon, in the evening, and at bedtime.     allopurinol (ZYLOPRIM) 100 MG tablet Take 1 tablet (100 mg total) by mouth daily. 30 tablet 0   carvedilol (COREG) 6.25 MG tablet Take 1 tablet (6.25 mg total) by mouth 2 (two) times daily with a meal. 60 tablet 0   No current facility-administered medications for this visit.     Review of Systems  Constitutional:  Constitutional negative. HENT: HENT negative.  Eyes: Eyes negative.  Respiratory: Respiratory negative.  Cardiovascular: Cardiovascular negative.  GI: Gastrointestinal negative.  Musculoskeletal: Musculoskeletal negative.  Skin: Skin negative.  Neurological: Neurological negative. Hematologic: Hematologic/lymphatic negative.  Psychiatric: Psychiatric negative.        Objective:  Objective   Vitals:   01/02/22 1128  BP: Marland Kitchen)  144/72  Pulse: (!) 56  Resp: 20  Temp: 97.9 F (36.6 C)  SpO2: 97%  Weight: 214 lb (97.1 kg)  Height: '5\' 9"'$  (1.753 m)   Body mass index is 31.6 kg/m.  Physical Exam HENT:     Head: Normocephalic.     Nose: Nose normal.  Eyes:     Pupils: Pupils are equal, round, and reactive to light.  Neck:     Comments: Right IJ tunnel dialysis catheter without any evidence of infection. Cardiovascular:     Rate and Rhythm: Normal rate.     Pulses:          Radial pulses are 2+ on the right side and 2+ on the left side.  Pulmonary:     Effort: Pulmonary effort is normal.  Abdominal:     General: Abdomen is flat.     Palpations: Abdomen is soft.  Skin:    General: Skin is warm.     Capillary Refill: Capillary refill takes less than 2 seconds.  Neurological:     General: No focal deficit present.     Mental Status: She is alert.  Psychiatric:        Mood and Affect: Mood normal.        Thought Content: Thought content normal.     Data: Right Pre-Dialysis Findings:  +-----------------------+----------+--------------------+---------+--------  +  Location              PSV (cm/s)Intralum. Diam. (cm)Waveform  Comments  +-----------------------+----------+--------------------+---------+--------  +  Brachial Antecub. fossa119       0.62                triphasic           +-----------------------+----------+--------------------+---------+--------  +  Radial Art at Wrist    85        0.24                 triphasic           +-----------------------+----------+--------------------+---------+--------  +  Ulnar Art at Wrist     37        0.12                triphasic           +-----------------------+----------+--------------------+---------+--------  +     Left Pre-Dialysis Findings:  +-----------------------+----------+--------------------+---------+--------  +  Location              PSV (cm/s)Intralum. Diam. (cm)Waveform  Comments  +-----------------------+----------+--------------------+---------+--------  +  Brachial Antecub. fossa71        0.60                triphasic           +-----------------------+----------+--------------------+---------+--------  +  Radial Art at Wrist    65        0.30                triphasic           +-----------------------+----------+--------------------+---------+--------  +  Ulnar Art at Wrist     64        0.17                triphasic           +-----------------------+----------+--------------------+---------+--------  +     important  This result has not been signed. Information might be incomplete.   Rivka Barbara  Procedures: VAS [place] UPPER EXTREMITY VEIN MAPPING (PRE-OP AVF)  Height: Not recorded  Weight: Not  recorded  Blood Pressure: Not recorded   Accession Number: [id number]  Date of Study: 01/02/22  Ordering Provider: Waynetta Sandy, MD  Clinical Indications: Preop vein mapping       Interpreting Physicians  Performing Staff  Result is not signed. Tech: Maudry Mayhew A     VAS [place] UPPER EXT VEIN MAPPING (PRE-OP AVF) (Accession [id number]) (Order [id number]) Vascular Ultrasound Date: 01/02/2022 Department: [facility] Released By: Vara Guardian Authorizing: Waynetta Sandy, MD   Exam Status  Status  Preliminary [70]   Reason for Exam Priority: Routine Preop vein mapping  Dx: CKD (chronic kidney disease), stage  V ([place]) [N18.5 (ICD-10-CM)]   MyChart Results Release  MyChart Status: Active  Results Release   Study Result  UPPER EXTREMITY VEIN MAPPING  Patient Name:  Tiffany [name]  Date of Exam:   01/02/2022 Medical Rec #: [id number]          Accession #:    [id number] Date of Birth: 06-06-1950           Patient Gender: F Patient Age:   48 years Exam Location:  [facility] Procedure:      VAS [place] UPPER EXT VEIN MAPPING (PRE-OP AVF) Referring Phys: Erlene Quan Jakobie Henslee   --------------------------------------------------------------------------- -----   Indications: Pre-access.  Comparison Study: No prior study  Performing Technologist: Maudry Mayhew MHA, RDMS, RVT, RDCS    Examination Guidelines: A complete evaluation includes B-mode imaging, spectral Doppler, color Doppler, and power Doppler as needed of all accessible portions of each vessel. Bilateral testing is considered an integral part of a complete examination. Limited examinations for reoccurring indications may be performed as noted.  +-----------------+-------------+----------+----------+ Right Cephalic   Diameter (cm)Depth (cm) Findings  +-----------------+-------------+----------+----------+ Shoulder             0.24        1.41              +-----------------+-------------+----------+----------+ Prox upper arm       0.22        1.00              +-----------------+-------------+----------+----------+ Mid upper arm        0.21        0.65   branching  +-----------------+-------------+----------+----------+ Dist upper arm       0.29        0.28   thrombosed +-----------------+-------------+----------+----------+ Antecubital fossa    0.17        0.33   thrombosed +-----------------+-------------+----------+----------+ Prox forearm         0.28        0.28              +-----------------+-------------+----------+----------+ Mid forearm          0.25        0.35    branching  +-----------------+-------------+----------+----------+ Wrist                0.21        0.39              +-----------------+-------------+----------+----------+  +-----------------+-------------+----------+--------------+ Right Basilic    Diameter (cm)Depth (cm)   Findings    +-----------------+-------------+----------+--------------+ Mid upper arm        0.27        1.65     branching    +-----------------+-------------+----------+--------------+ Dist upper arm  not visualized +-----------------+-------------+----------+--------------+ Antecubital fossa                       not visualized +-----------------+-------------+----------+--------------+ Prox forearm                            not visualized +-----------------+-------------+----------+--------------+ Mid forearm                             not visualized +-----------------+-------------+----------+--------------+ Distal forearm                          not visualized +-----------------+-------------+----------+--------------+ Wrist                                   not visualized +-----------------+-------------+----------+--------------+  +-----------------+-------------+----------+---------+ Left Cephalic    Diameter (cm)Depth (cm)Findings  +-----------------+-------------+----------+---------+ Shoulder             0.26        1.25             +-----------------+-------------+----------+---------+ Prox upper arm       0.18        0.90             +-----------------+-------------+----------+---------+ Mid upper arm        0.23        1.00   branching +-----------------+-------------+----------+---------+ Dist upper arm       0.20        0.87   branching +-----------------+-------------+----------+---------+ Antecubital fossa    0.29        0.33              +-----------------+-------------+----------+---------+ Prox forearm         0.16        0.37   branching +-----------------+-------------+----------+---------+ Mid forearm          0.12        0.51   branching +-----------------+-------------+----------+---------+ Wrist                0.11        0.22             +-----------------+-------------+----------+---------+  +-----------------+-------------+----------+---------+ Left Basilic     Diameter (cm)Depth (cm)Findings  +-----------------+-------------+----------+---------+ Prox upper arm       0.45        1.34             +-----------------+-------------+----------+---------+ Mid upper arm        0.26        1.90             +-----------------+-------------+----------+---------+ Dist upper arm       0.21        1.48             +-----------------+-------------+----------+---------+ Antecubital fossa    0.22        0.41   branching +-----------------+-------------+----------+---------+ Prox forearm         0.15        0.68             +-----------------+-------------+----------+---------+       Assessment/Plan:     71 year old female with end-stage renal disease currently dialyzing via catheter.  She is right-hand dominant.  We discussed peritoneal dialysis versus hemodialysis and patient would rather proceed with continuing hemodialysis therapy via left  upper extremity access.  Plan will be for left arm fistula possibly graft on a nondialysis day in the near future.  She will continue aspirin and Plavix perioperatively.    Waynetta Sandy MD [facility]

## 2022-01-16 ENCOUNTER — Other Ambulatory Visit: Payer: Self-pay | Admitting: Interventional Cardiology

## 2022-01-17 ENCOUNTER — Other Ambulatory Visit: Payer: Self-pay

## 2022-01-17 ENCOUNTER — Encounter (HOSPITAL_COMMUNITY): Payer: Self-pay | Admitting: Vascular Surgery

## 2022-01-17 NOTE — Progress Notes (Signed)
Spoke with pt for pre-op call. Pt has hx of MI and stents placed in April, 2023. Dr. Tamala Julian is her cardiologist. She denies any recent chest pain or shortness of breath. Pt is a type 2 diabetic, not on any medications at this time. Her last A1C was 6.2 on 10/17/21. She states her fasting blood sugar is usually around 80. Instructed pt to check her blood sugar when she wakes up in the AM and every 2 hours until she leaves for the hospital. If blood sugar is 70 or below, treat with 1/2 cup of clear juice (apple or cranberry) and recheck blood sugar 15 minutes after drinking juice. If blood sugar continues to be 70 or below, call the Short Stay department and ask to speak to a nurse. Pt voiced understanding.   Shower instructions given to pt and she voiced understanding.

## 2022-01-17 NOTE — Anesthesia Preprocedure Evaluation (Addendum)
Anesthesia Evaluation  Patient identified by MRN, date of birth, ID band Patient awake    Reviewed: Allergy & Precautions, NPO status , Patient's Chart, lab work & pertinent test results  Airway Mallampati: II  TM Distance: >3 FB Neck ROM: Full    Dental no notable dental hx. (+) Teeth Intact, Dental Advisory Given   Pulmonary sleep apnea    Pulmonary exam normal breath sounds clear to auscultation       Cardiovascular hypertension, + Past MI (05/2021) and +CHF  Normal cardiovascular exam+ Valvular Problems/Murmurs MR and AS  Rhythm:Regular Rate:Normal  05/2021 TTE  1. Left ventricular ejection fraction, by estimation, is 70 to 75%. The  left ventricle has hyperdynamic function. The left ventricle has no  regional wall motion abnormalities. There is severe concentric left  ventricular hypertrophy. Left ventricular  diastolic function could not be evaluated.   2. Moderate calcific mitral valve disease is present. MG 6.3 mmHG @ 69  bpm. There is calcified chordal tissue under the MV which does demonstrate  some mobility. 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 The mitral valve is  degenerative. Trivial mitral valve regurgitation. Moderate mitral  stenosis. The mean mitral valve gradient is 6.3 mmHg with average heart  rate of 69 bpm.   3. Right ventricular systolic function is normal. The right ventricular  size is normal. There is mildly elevated pulmonary artery systolic  pressure. The estimated right ventricular systolic pressure is 14.7 mmHg.   4. The aortic valve is tricuspid. There is moderate calcification of the  aortic valve. There is moderate thickening of the aortic valve. Aortic  valve regurgitation is not visualized. Mild aortic valve stenosis. Aortic  valve area, by VTI measures 2.04  cm. Aortic valve mean gradient measures 11.0 mmHg. Aortic valve Vmax  measures 2.36 m/s.   5. Left  atrial size was severely dilated.   6. Right atrial size was mildly dilated.   7. A small pericardial effusion is present. The pericardial effusion is  circumferential.   8. The inferior vena cava is dilated in size with <50% respiratory  variability, suggesting right atrial pressure of 15 mmHg.     Neuro/Psych   Anxiety        GI/Hepatic negative GI ROS, Neg liver ROS,,,  Endo/Other  diabetesHypothyroidism    Renal/GU ESRF and CRFRenal diseaseDialysis TTS  Lab Results      Component                Value               Date                      CREATININE               3.90 (H)            01/18/2022                BUN                      39 (H)              01/18/2022                NA                       140  01/18/2022                K                        4.0                 01/18/2022                CL                       100                 01/18/2022                Musculoskeletal  (+) Arthritis ,    Abdominal   Peds  Hematology Lab Results      Component                Value               Date                              HGB                      14.3                01/18/2022                HCT                      42.0                01/18/2022                 Anesthesia Other Findings All: Sulfa  Reproductive/Obstetrics                             Anesthesia Physical Anesthesia Plan  ASA: 4  Anesthesia Plan: MAC   Post-op Pain Management: Regional block* and Minimal or no pain anticipated   Induction: Intravenous  PONV Risk Score and Plan: Treatment may vary due to age or medical condition, Midazolam and Ondansetron  Airway Management Planned: Nasal Cannula and Natural Airway  Additional Equipment: None  Intra-op Plan:   Post-operative Plan:   Informed Consent: I have reviewed the patients History and Physical, chart, labs and discussed the procedure including the risks, benefits and alternatives  for the proposed anesthesia with the patient or authorized representative who has indicated his/her understanding and acceptance.     Dental advisory given  Plan Discussed with:   Anesthesia Plan Comments: (  See PAT note written 01/17/2022 by Myra Gianotti, PA-C. Jehovah's Witness. DES x2 PDA 05/12/21. To continue DPAT perioperatively. Moderate MS and mild AS by echo.     Echo 05/12/21: IMPRESSIONS  1. Left ventricular ejection fraction, by estimation, is 70 to 75%. The  left ventricle has hyperdynamic function. The left ventricle has no  regional wall motion abnormalities. There is severe concentric left  ventricular hypertrophy. Left ventricular  diastolic function could not be evaluated.  2. Moderate calcific mitral valve disease is present. MG 6.3 mmHG @ 69  bpm. There is calcified chordal tissue under the MV which does demonstrate  some mobility. 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 The mitral valve is  degenerative. Trivial mitral valve regurgitation. Moderate mitral  stenosis. The mean mitral valve gradient is 6.3 mmHg with average heart  rate of 69 bpm.  3. Right ventricular systolic function is normal. The right ventricular  size is normal. There is mildly elevated pulmonary artery systolic  pressure. The estimated right ventricular systolic pressure is 25.6 mmHg.  4. The aortic valve is tricuspid. There is moderate calcification of the  aortic valve. There is moderate thickening of the aortic valve. Aortic  valve regurgitation is not visualized. Mild aortic valve stenosis. Aortic  valve area, by VTI measures 2.04  cm. Aortic valve mean gradient measures 11.0 mmHg. Aortic valve Vmax  measures 2.36 m/s.  5. Left atrial size was severely dilated.  6. Right atrial size was mildly dilated.  7. A small pericardial effusion is present. The pericardial effusion is  circumferential.  8. The inferior vena cava is dilated in size with  <50% respiratory  variability, suggesting right atrial pressure of 15 mmHg.  - Comparison(s): No significant change from prior study.    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  Dist RCA lesion is 80% stenosed.  A stent was successfully placed.  A stent was successfully placed.  Post intervention, there is a 0% residual stenosis.  Post intervention, there is a 0% residual stenosis.  LV end diastolic pressure is normal.  1. 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 2. LVEDP of 11 mmHg.  Recommendations: Aggrastat infusion for 18 hours and dual antiplatelet therapy for at least 1 year. )       Anesthesia Quick Evaluation

## 2022-01-17 NOTE — Progress Notes (Signed)
Anesthesia Chart Review:  Case: [id number] Date/Time: 01/18/22 1208   Procedure: LEFT ARM ARTERIOVENOUS (AV) FISTULA VERSUS ARTERIOVENOUS GRAFT CREATION (Left)   Anesthesia type: Choice   Pre-op diagnosis: ESRD   Location: [facility] OR ROOM 11 / [place] OR   Surgeons: Waynetta Sandy, MD       DISCUSSION: Patient is a 71 year old female scheduled for the above procedure. Hemodialysis initiated in September 2023 via right IJ TDC. She needs permanent HD access.   History includes never smoker, HTN, HLD, CAD (RCA STEMI, DES x2 PDA 05/12/21), murmur (moderate MS, mild AS 05/12/21),  chronic diastolic CHF, OSA (CPAP), ESRD (HD initiated 10/18/21), DM2, hypothyroidism, anemia, osteoarthritis (left TKA 07/01/19), spinal surgery (Left L4-5 microdiscectomy 04/09/00; L3-4 PLIF 07/23/01), Jehovah's Witness.  Last cardiology visit with Dr. Tamala Julian was on 10/15/21. She had been resistant to starting dialysis but was having worsening dyspnea and diastolic HF. He encouraged her to follow-up with nephrology about arranging initiation of dialysis-->admitted 3 days later and started on HD. Continue DAP for CAD. Follow-up in six months.  Dr. Donzetta Matters advised that she continue ASA and Plavix (DES < 12 months ago). She is a same day work-up. Anesthesia team to evaluate on the day of surgery.   VS:  BP Readings from Last 3 Encounters:  01/02/22 (!) 144/72  10/25/21 (!) 169/80  10/15/21 138/62   Pulse Readings from Last 3 Encounters:  01/02/22 (!) 56  10/25/21 62  10/15/21 (!) 52     PROVIDERS: Nolene Ebbs, MD is PCP  Daneen Schick, MD is cardiologist-->to be Lenna Sciara, MD Trevor Iha, MD is nephrologist Baird Lyons, MD is pulmonologist   LABS: On day of surgery. Last results in [facility] include: Lab Results  Component Value Date   WBC 4.9 10/24/2021   HGB 7.9 (L) 10/24/2021   HCT 26.2 (L) 10/24/2021   PLT 84 (L) 10/24/2021   GLUCOSE 114 (H) 10/24/2021   CHOL 151 05/11/2021   TRIG 51 05/11/2021   HDL  45 05/11/2021   LDLCALC 96 05/11/2021   ALT 15 10/18/2021   AST 16 10/18/2021   NA 139 10/24/2021   K 4.3 10/24/2021   CL 102 10/24/2021   CREATININE 3.51 (H) 10/24/2021   BUN 46 (H) 10/24/2021   CO2 25 10/24/2021   TSH 5.72 (H) 05/28/2021   INR 1.1 05/11/2021   HGBA1C 6.2 (H) 10/17/2021    IMAGES: 1V CXR 10/19/21: IMPRESSION: 1. New tunneled right internal jugular dialysis catheter with interval retraction of the dialysis catheter tip into the proximal to mid SVC, previously within the right atrium at placement. Such location may inhibit adequate dialysis and catheter advancement is recommended. 2. Unchanged cardiomegaly and mild pulmonary vascular congestion.    EKG: 10/17/21:  Sinus rhythm Low voltage, extremity and precordial leads , new since last tracing Probable anteroseptal infarct, old Confirmed by Dorie Rank [phone]) on 10/17/2021 3:05:15 PM   CV: Echo 05/12/21: IMPRESSIONS   1. Left ventricular ejection fraction, by estimation, is 70 to 75%. The  left ventricle has hyperdynamic function. The left ventricle has no  regional wall motion abnormalities. There is severe concentric left  ventricular hypertrophy. Left ventricular  diastolic function could not be evaluated.   2. Moderate calcific mitral valve disease is present. MG 6.3 mmHG @ 69  bpm. There is calcified chordal tissue under the MV which does demonstrate  some mobility. 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 The mitral valve is  degenerative.  Trivial mitral valve regurgitation. Moderate mitral  stenosis. The mean mitral valve gradient is 6.3 mmHg with average heart  rate of 69 bpm.   3. Right ventricular systolic function is normal. The right ventricular  size is normal. There is mildly elevated pulmonary artery systolic  pressure. The estimated right ventricular systolic pressure is 08.6 mmHg.   4. The aortic valve is tricuspid. There is moderate calcification of the   aortic valve. There is moderate thickening of the aortic valve. Aortic  valve regurgitation is not visualized. Mild aortic valve stenosis. Aortic  valve area, by VTI measures 2.04  cm. Aortic valve mean gradient measures 11.0 mmHg. Aortic valve Vmax  measures 2.36 m/s.   5. Left atrial size was severely dilated.   6. Right atrial size was mildly dilated.   7. A small pericardial effusion is present. The pericardial effusion is  circumferential.   8. The inferior vena cava is dilated in size with <50% respiratory  variability, suggesting right atrial pressure of 15 mmHg.  - Comparison(s): No significant change from prior study.    Cardiac cath 05/12/21:   RPDA lesion is 100% stenosed.   Dist RCA lesion is 80% stenosed.   A stent was successfully placed.   A stent was successfully placed.   Post intervention, there is a 0% residual stenosis.   Post intervention, there is a 0% residual stenosis.   LV end diastolic pressure is normal.   1.  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 2.  LVEDP of 11 mmHg.   Recommendations: Aggrastat infusion for 18 hours and dual antiplatelet therapy for at least 1 year.  Given the patient's chronic kidney disease this will need to be monitored over the next few days and the need for renal replacement therapy assessed.   24 Hour Holter monitor 09/09/16: NSR Rare PAC's and PVC's. Normal study   Past Medical History:  Diagnosis Date   ALLERGIC RHINITIS    Anxiety    Arthritis    CHF (congestive heart failure) (HCC)    CKD (chronic kidney disease)    Dialysis T/Th/Sa   Coronary artery disease    05/12/21 - cath/ stent placement   COVID 01/21/2021   + HOME TEST  HAD COUGH CONGESTION AND MALAISE, OCC CONGESTION NOW   COVID 2022   mild   Heart murmur    at  birth   Hyperlipidemia    Hypertension    Hypothyroid    Iron deficiency anemia    Obesity    OSA on CPAP    no longer on a cpap   Refusal of blood transfusions as patient is Jehovah's Witness    Shingles 11/2020   LEFT BACK   Type 2 dm with insulin use    checks abg qday runs 125   Wears glasses     Past Surgical History:  Procedure Laterality Date   BREAST BIOPSY     left breast per pt; benign 20+ yrs ago   [place]   COLONOSCOPY WITH PROPOFOL N/A 10/24/2020   Procedure: COLONOSCOPY WITH PROPOFOL;  Surgeon: Ronnette Juniper, MD;  Location: [facility];  Service: Gastroenterology;  Laterality: N/A;   CORONARY STENT INTERVENTION N/A 05/12/2021   Procedure: CORONARY STENT INTERVENTION;  Surgeon: Early Osmond, MD;  Location: [place] [facility];  Service: Cardiovascular;  Laterality: N/A;   CORONARY/GRAFT ACUTE MI REVASCULARIZATION N/A 05/12/2021   Procedure: Coronary/Graft Acute MI Revascularization;  Surgeon: Early Osmond, MD;  Location: [place] [facility];  Service: Cardiovascular;  Laterality: N/A;   IR FLUORO GUIDE CV LINE RIGHT  10/18/2021   IR US GUIDE VASC ACCESS RIGHT  10/18/2021   KNEE SURGERY Bilateral    x 2   LEFT HEART CATH AND CORONARY ANGIOGRAPHY N/A 05/12/2021   Procedure: LEFT HEART CATH AND CORONARY ANGIOGRAPHY;  Surgeon: Early Osmond, MD;  Location: [place] [facility];  Service: Cardiovascular;  Laterality: N/A;   LUMBAR DISC SURGERY     x 2   POLYPECTOMY  10/24/2020   Procedure: POLYPECTOMY;  Surgeon: Ronnette Juniper, MD;  Location: [facility];  Service: Gastroenterology;;   REDUCTION MAMMAPLASTY Bilateral 1985   TOE SURGERY     yrs ago per pt on 02-02-2021   TOTAL ABDOMINAL HYSTERECTOMY  1997   partial hysterectomy- still has ovaries   TOTAL KNEE ARTHROPLASTY Left 07/01/2019   Procedure: TOTAL KNEE ARTHROPLASTY;  Surgeon: Paralee Cancel, MD;  Location: [facility];  Service: Orthopedics;  Laterality: Left;  70 min NO BLOOD PRODUCTS!!    TREATMENT FISTULA ANAL     yrs ago per pt 0n 02-02-2021   TRIGGER FINGER RELEASE Left 02/08/2021   Procedure: Left Ring trigger finger release, left ring finger volar ganglion cyst excision;  Surgeon: Orene Desanctis, MD;  Location: [facility];  Service: Orthopedics;  Laterality: Left;  with local anesthesia    MEDICATIONS: No current facility-administered medications for this encounter.    acetaminophen (TYLENOL) 650 MG CR tablet   albuterol (VENTOLIN HFA) 108 (90 Base) MCG/ACT inhaler   allopurinol (ZYLOPRIM) 100 MG tablet   amLODipine (NORVASC) 10 MG tablet   aspirin EC 81 MG tablet   atorvastatin (LIPITOR) 80 MG tablet   B Complex-C (B-COMPLEX WITH VITAMIN C) tablet   B Complex-C-Zn-Folic Acid (DIALYVITE/ZINC) TABS   calcitRIOL (ROCALTROL) 0.5 MCG capsule   carvedilol (COREG) 25 MG tablet   cetirizine (ZYRTEC) 10 MG tablet   clopidogrel (PLAVIX) 75 MG tablet   fluticasone (FLONASE) 50 MCG/ACT nasal spray   hydrALAZINE (APRESOLINE) 50 MG tablet   levothyroxine (SYNTHROID) 200 MCG tablet   LORazepam (ATIVAN) 1 MG tablet   Multiple Vitamins-Minerals (PRESERVISION AREDS 2 PO)   nitroGLYCERIN (NITROSTAT) 0.4 MG SL tablet   Omega-3 Fatty Acids (FISH OIL) 1000 MG CAPS   Propylene Glycol (SYSTANE COMPLETE) 0.6 % SOLN   traZODone (DESYREL) 100 MG tablet   carvedilol (COREG) 6.25 MG tablet     Myra Gianotti, PA-C Surgical Short Stay/Anesthesiology [facility] Phone [phone] [facility] Phone [phone] 01/17/2022 5:54 PM

## 2022-01-18 ENCOUNTER — Ambulatory Visit (HOSPITAL_COMMUNITY)
Admission: RE | Admit: 2022-01-18 | Discharge: 2022-01-18 | Disposition: A | Discharge status: Home / Self Care | Payer: Medicare Other | Attending: Vascular Surgery | Admitting: Vascular Surgery

## 2022-01-18 ENCOUNTER — Other Ambulatory Visit: Payer: Self-pay

## 2022-01-18 ENCOUNTER — Encounter (HOSPITAL_COMMUNITY)
Admission: RE | Disposition: A | Discharge status: Home / Self Care | Payer: Self-pay | Source: Home / Self Care | Attending: Vascular Surgery

## 2022-01-18 ENCOUNTER — Ambulatory Visit (HOSPITAL_BASED_OUTPATIENT_CLINIC_OR_DEPARTMENT_OTHER): Payer: Medicare Other | Admitting: Vascular Surgery

## 2022-01-18 ENCOUNTER — Encounter (HOSPITAL_COMMUNITY): Payer: Self-pay | Admitting: Vascular Surgery

## 2022-01-18 ENCOUNTER — Ambulatory Visit (HOSPITAL_COMMUNITY): Payer: Medicare Other | Admitting: Vascular Surgery

## 2022-01-18 DIAGNOSIS — Z7902 Long term (current) use of antithrombotics/antiplatelets: Secondary | ICD-10-CM | POA: Diagnosis not present

## 2022-01-18 DIAGNOSIS — Z794 Long term (current) use of insulin: Secondary | ICD-10-CM | POA: Insufficient documentation

## 2022-01-18 DIAGNOSIS — I080 Rheumatic disorders of both mitral and aortic valves: Secondary | ICD-10-CM | POA: Insufficient documentation

## 2022-01-18 DIAGNOSIS — G4733 Obstructive sleep apnea (adult) (pediatric): Secondary | ICD-10-CM | POA: Insufficient documentation

## 2022-01-18 DIAGNOSIS — I132 Hypertensive heart and chronic kidney disease with heart failure and with stage 5 chronic kidney disease, or end stage renal disease: Secondary | ICD-10-CM | POA: Diagnosis not present

## 2022-01-18 DIAGNOSIS — Z7982 Long term (current) use of aspirin: Secondary | ICD-10-CM | POA: Diagnosis not present

## 2022-01-18 DIAGNOSIS — N186 End stage renal disease: Secondary | ICD-10-CM | POA: Insufficient documentation

## 2022-01-18 DIAGNOSIS — I509 Heart failure, unspecified: Secondary | ICD-10-CM | POA: Diagnosis not present

## 2022-01-18 DIAGNOSIS — I2510 Atherosclerotic heart disease of native coronary artery without angina pectoris: Secondary | ICD-10-CM | POA: Insufficient documentation

## 2022-01-18 DIAGNOSIS — E039 Hypothyroidism, unspecified: Secondary | ICD-10-CM | POA: Insufficient documentation

## 2022-01-18 DIAGNOSIS — Z992 Dependence on renal dialysis: Secondary | ICD-10-CM | POA: Insufficient documentation

## 2022-01-18 DIAGNOSIS — E1122 Type 2 diabetes mellitus with diabetic chronic kidney disease: Secondary | ICD-10-CM | POA: Diagnosis not present

## 2022-01-18 DIAGNOSIS — I252 Old myocardial infarction: Secondary | ICD-10-CM | POA: Insufficient documentation

## 2022-01-18 DIAGNOSIS — I3139 Other pericardial effusion (noninflammatory): Secondary | ICD-10-CM | POA: Diagnosis not present

## 2022-01-18 DIAGNOSIS — N185 Chronic kidney disease, stage 5: Secondary | ICD-10-CM | POA: Diagnosis not present

## 2022-01-18 DIAGNOSIS — F419 Anxiety disorder, unspecified: Secondary | ICD-10-CM | POA: Diagnosis not present

## 2022-01-18 DIAGNOSIS — I82612 Acute embolism and thrombosis of superficial veins of left upper extremity: Secondary | ICD-10-CM | POA: Diagnosis not present

## 2022-01-18 HISTORY — DX: Anxiety disorder, unspecified: F41.9

## 2022-01-18 HISTORY — PX: AV FISTULA PLACEMENT: SHX1204

## 2022-01-18 HISTORY — DX: Atherosclerotic heart disease of native coronary artery without angina pectoris: I25.10

## 2022-01-18 HISTORY — PX: THROMBECTOMY BRACHIAL ARTERY: SHX6649

## 2022-01-18 LAB — POCT I-STAT, CHEM 8
BUN: 39 mg/dL — ABNORMAL HIGH (ref 8–23)
Calcium, Ion: 1.17 mmol/L (ref 1.15–1.40)
Chloride: 100 mmol/L (ref 98–111)
Creatinine, Ser: 3.9 mg/dL — ABNORMAL HIGH (ref 0.44–1.00)
Glucose, Bld: 163 mg/dL — ABNORMAL HIGH (ref 70–99)
HCT: 42 % (ref 36.0–46.0)
Hemoglobin: 14.3 g/dL (ref 12.0–15.0)
Potassium: 4 mmol/L (ref 3.5–5.1)
Sodium: 140 mmol/L (ref 135–145)
TCO2: 31 mmol/L (ref 22–32)

## 2022-01-18 LAB — GLUCOSE, CAPILLARY
Glucose-Capillary: 155 mg/dL — ABNORMAL HIGH (ref 70–99)
Glucose-Capillary: 162 mg/dL — ABNORMAL HIGH (ref 70–99)
Glucose-Capillary: 171 mg/dL — ABNORMAL HIGH (ref 70–99)

## 2022-01-18 LAB — NO BLOOD PRODUCTS

## 2022-01-18 SURGERY — ARTERIOVENOUS (AV) FISTULA CREATION
Anesthesia: Monitor Anesthesia Care | Site: Arm Upper | Laterality: Left

## 2022-01-18 MED ORDER — CEFAZOLIN SODIUM-DEXTROSE 2-4 GM/100ML-% IV SOLN
2.0000 g | INTRAVENOUS | Status: AC
  Administered 2022-01-18: 2 g via INTRAVENOUS
  Filled 2022-01-18: qty 100

## 2022-01-18 MED ORDER — BUPIVACAINE HCL (PF) 0.5 % IJ SOLN
INTRAMUSCULAR | Status: DC | PRN
  Administered 2022-01-18: 23 mL via PERINEURAL

## 2022-01-18 MED ORDER — PROPOFOL 10 MG/ML IV BOLUS
INTRAVENOUS | Status: DC | PRN
  Administered 2022-01-18: 5 mg via INTRAVENOUS
  Administered 2022-01-18: 10 mg via INTRAVENOUS

## 2022-01-18 MED ORDER — EPHEDRINE 5 MG/ML INJ
INTRAVENOUS | Status: AC
  Filled 2022-01-18: qty 5

## 2022-01-18 MED ORDER — TRAMADOL HCL 50 MG PO TABS: 50.0000 mg | ORAL_TABLET | Freq: Four times a day (QID) | ORAL | 0 refills | Status: DC | PRN

## 2022-01-18 MED ORDER — HEPARIN 6000 UNIT IRRIGATION SOLUTION
Status: DC | PRN
  Administered 2022-01-18: 1

## 2022-01-18 MED ORDER — FENTANYL CITRATE (PF) 100 MCG/2ML IJ SOLN
INTRAMUSCULAR | Status: AC
  Administered 2022-01-18: 50 ug via INTRAVENOUS
  Filled 2022-01-18: qty 2

## 2022-01-18 MED ORDER — BUPIVACAINE-EPINEPHRINE (PF) 0.5% -1:200000 IJ SOLN
INTRAMUSCULAR | Status: AC
  Filled 2022-01-18: qty 30

## 2022-01-18 MED ORDER — CHLORHEXIDINE GLUCONATE 0.12 % MT SOLN
OROMUCOSAL | Status: AC
  Administered 2022-01-18: 15 mL
  Filled 2022-01-18: qty 15

## 2022-01-18 MED ORDER — CHLORHEXIDINE GLUCONATE 4 % EX LIQD: 60.0000 mL | Freq: Once | CUTANEOUS | Status: DC

## 2022-01-18 MED ORDER — PAPAVERINE HCL 30 MG/ML IJ SOLN
INTRAMUSCULAR | Status: AC
  Filled 2022-01-18: qty 2

## 2022-01-18 MED ORDER — PROPOFOL 500 MG/50ML IV EMUL
INTRAVENOUS | Status: DC | PRN
  Administered 2022-01-18: 75 ug/kg/min via INTRAVENOUS

## 2022-01-18 MED ORDER — PHENYLEPHRINE 80 MCG/ML (10ML) SYRINGE FOR IV PUSH (FOR BLOOD PRESSURE SUPPORT)
PREFILLED_SYRINGE | INTRAVENOUS | Status: AC
  Filled 2022-01-18: qty 10

## 2022-01-18 MED ORDER — EPHEDRINE SULFATE-NACL 50-0.9 MG/10ML-% IV SOSY
PREFILLED_SYRINGE | INTRAVENOUS | Status: DC | PRN
  Administered 2022-01-18: 5 mg via INTRAVENOUS
  Administered 2022-01-18 (×2): 2.5 mg via INTRAVENOUS
  Administered 2022-01-18 (×2): 5 mg via INTRAVENOUS

## 2022-01-18 MED ORDER — ONDANSETRON HCL 4 MG/2ML IJ SOLN
INTRAMUSCULAR | Status: DC | PRN
  Administered 2022-01-18: 4 mg via INTRAVENOUS

## 2022-01-18 MED ORDER — FENTANYL CITRATE (PF) 100 MCG/2ML IJ SOLN: 50.0000 ug | Freq: Once | INTRAMUSCULAR | Status: AC

## 2022-01-18 MED ORDER — HEPARIN 6000 UNIT IRRIGATION SOLUTION
Status: AC
  Filled 2022-01-18: qty 500

## 2022-01-18 MED ORDER — MIDAZOLAM HCL 2 MG/2ML IJ SOLN
INTRAMUSCULAR | Status: AC
  Filled 2022-01-18: qty 2

## 2022-01-18 MED ORDER — INSULIN ASPART 100 UNIT/ML IJ SOLN: 0.0000 [IU] | INTRAMUSCULAR | Status: DC | PRN

## 2022-01-18 MED ORDER — STERILE WATER FOR IRRIGATION IR SOLN
Status: DC | PRN
  Administered 2022-01-18: 1000 mL

## 2022-01-18 MED ORDER — SODIUM CHLORIDE 0.9 % IV SOLN: INTRAVENOUS | Status: DC

## 2022-01-18 MED ORDER — 0.9 % SODIUM CHLORIDE (POUR BTL) OPTIME
TOPICAL | Status: DC | PRN
  Administered 2022-01-18: 1000 mL

## 2022-01-18 MED ORDER — ONDANSETRON HCL 4 MG/2ML IJ SOLN
INTRAMUSCULAR | Status: AC
  Filled 2022-01-18: qty 2

## 2022-01-18 SURGICAL SUPPLY — 35 items
ARMBAND PINK RESTRICT EXTREMIT (MISCELLANEOUS) ×2 IMPLANT
BAG COUNTER SPONGE SURGICOUNT (BAG) ×2 IMPLANT
CANISTER SUCT 3000ML PPV (MISCELLANEOUS) ×2 IMPLANT
CATH EMB 3FR 40CM (CATHETERS) IMPLANT
CLIP LIGATING EXTRA MED SLVR (CLIP) ×2 IMPLANT
CLIP LIGATING EXTRA SM BLUE (MISCELLANEOUS) ×2 IMPLANT
COVER PROBE W GEL 5X96 (DRAPES) IMPLANT
DERMABOND ADVANCED .7 DNX12 (GAUZE/BANDAGES/DRESSINGS) ×2 IMPLANT
ELECT REM PT RETURN 9FT ADLT (ELECTROSURGICAL) ×2
ELECTRODE REM PT RTRN 9FT ADLT (ELECTROSURGICAL) ×2 IMPLANT
GLOVE BIO SURGEON STRL SZ7.5 (GLOVE) ×2 IMPLANT
GOWN STRL REUS W/ TWL LRG LVL3 (GOWN DISPOSABLE) ×4 IMPLANT
GOWN STRL REUS W/ TWL XL LVL3 (GOWN DISPOSABLE) ×2 IMPLANT
GOWN STRL REUS W/TWL LRG LVL3 (GOWN DISPOSABLE) ×4
GOWN STRL REUS W/TWL XL LVL3 (GOWN DISPOSABLE) ×2
INSERT FOGARTY SM (MISCELLANEOUS) IMPLANT
KIT BASIN OR (CUSTOM PROCEDURE TRAY) ×2 IMPLANT
KIT TURNOVER KIT B (KITS) ×2 IMPLANT
LOOP VESSEL MINI RED (MISCELLANEOUS) IMPLANT
NS IRRIG 1000ML POUR BTL (IV SOLUTION) ×2 IMPLANT
PACK CV ACCESS (CUSTOM PROCEDURE TRAY) ×2 IMPLANT
PAD ARMBOARD 7.5X6 YLW CONV (MISCELLANEOUS) ×4 IMPLANT
POWDER SURGICEL 3.0 GRAM (HEMOSTASIS) IMPLANT
SLING ARM FOAM STRAP LRG (SOFTGOODS) IMPLANT
SLING ARM FOAM STRAP MED (SOFTGOODS) IMPLANT
SUT MNCRL AB 4-0 PS2 18 (SUTURE) ×2 IMPLANT
SUT PROLENE 5 0 C 1 24 (SUTURE) IMPLANT
SUT PROLENE 6 0 BV (SUTURE) ×2 IMPLANT
SUT SILK 3 0 (SUTURE) ×2
SUT SILK 3-0 18XBRD TIE 12 (SUTURE) IMPLANT
SUT VIC AB 3-0 SH 27 (SUTURE) ×2
SUT VIC AB 3-0 SH 27X BRD (SUTURE) ×2 IMPLANT
TOWEL GREEN STERILE (TOWEL DISPOSABLE) ×2 IMPLANT
UNDERPAD 30X36 HEAVY ABSORB (UNDERPADS AND DIAPERS) ×2 IMPLANT
WATER STERILE IRR 1000ML POUR (IV SOLUTION) ×2 IMPLANT

## 2022-01-18 NOTE — Discharge Instructions (Signed)
[facility]  Discharge Instructions  AV Fistula or Graft Surgery for Dialysis Access  Please refer to the following instructions for your post-procedure care. Your surgeon or physician assistant will discuss any changes with you.  Activity  You may drive the day following your surgery, if you are comfortable and no longer taking prescription pain medication. Resume full activity as the soreness in your incision resolves.  Bathing/Showering  You may shower after you go home. Keep your incision dry for 48 hours. Do not soak in a bathtub, hot tub, or swim until the incision heals completely. You may not shower if you have a hemodialysis catheter.  Incision Care  Clean your incision with mild soap and water after 48 hours. Pat the area dry with a clean towel. You do not need a bandage unless otherwise instructed. Do not apply any ointments or creams to your incision. You may have skin glue on your incision. Do not peel it off. It will come off on its own in about one week. Your arm may swell a bit after surgery. To reduce swelling use pillows to elevate your arm so it is above your heart. Your doctor will tell you if you need to lightly wrap your arm with an ACE bandage.  Diet  Resume your normal diet. There are not special food restrictions following this procedure. In order to heal from your surgery, it is CRITICAL to get adequate nutrition. Your body requires vitamins, minerals, and protein. Vegetables are the best source of vitamins and minerals. Vegetables also provide the perfect balance of protein. Processed food has little nutritional value, so try to avoid this.  Medications  Resume taking all of your medications. If your incision is causing pain, you may take over-the counter pain relievers such as acetaminophen (Tylenol). If you were prescribed a stronger pain medication, please be aware these medications can cause nausea and constipation. Prevent  nausea by taking the medication with a snack or meal. Avoid constipation by drinking plenty of fluids and eating foods with high amount of fiber, such as fruits, vegetables, and grains.  Do not take Tylenol if you are taking prescription pain medications.  Follow up Your surgeon may want to see you in the office following your access surgery. If so, this will be arranged at the time of your surgery.  Please call us immediately for any of the following conditions:  Increased pain, redness, drainage (pus) from your incision site Fever of 101 degrees or higher Severe or worsening pain at your incision site Hand pain or numbness.  Reduce your risk of vascular disease:  Stop smoking. If you would like help, call [facility] at 1-800-QUIT-NOW (313)093-6108) or [place] at [place] your cholesterol Maintain a desired weight Control your diabetes Keep your blood pressure down  Dialysis  It will take several weeks to several months for your new dialysis access to be ready for use. Your surgeon will determine when it is okay to use it. Your nephrologist will continue to direct your dialysis. You can continue to use your Permcath until your new access is ready for use.   01/18/2022 Tiffany [name] [id number] 03/21/1950  Surgeon(s): Waynetta Sandy, MD  Procedure(s): LEFT ARM ARTERIOVENOUS (AV) FISTULA THROMBECTOMY TO LEFT CEPHALIC VEIN   May stick graft immediately   May stick graft on designated area only:   X Do not stick left AV fistula for 12 weeks    If you have any questions,  please call the office at [phone].

## 2022-01-18 NOTE — Interval H&P Note (Signed)
History and Physical Interval Note:  01/18/2022 11:11 AM  Tiffany [name]  has presented today for surgery, with the diagnosis of ESRD.  The various methods of treatment have been discussed with the patient and family. After consideration of risks, benefits and other options for treatment, the patient has consented to  Procedure(s): LEFT ARM ARTERIOVENOUS (AV) FISTULA VERSUS ARTERIOVENOUS GRAFT CREATION (Left) as a surgical intervention.  The patient's history has been reviewed, patient examined, no change in status, stable for surgery.  I have reviewed the patient's chart and labs.  Questions were answered to the patient's satisfaction.     [name]

## 2022-01-18 NOTE — Transfer of Care (Signed)
Immediate Anesthesia Transfer of Care Note  Patient: Tiffany [name]  Procedure(s) Performed: LEFT ARM ARTERIOVENOUS (AV) FISTULA (Left: Arm Upper) THROMBECTOMY TO LEFT CEPHALIC VEIN (Arm Upper)  Patient Location: PACU  Anesthesia Type:MAC and Regional  Level of Consciousness: drowsy and patient cooperative  Airway & Oxygen Therapy: Patient Spontanous Breathing and Patient connected to face mask oxygen  Post-op Assessment: Report given to RN and Post -op Vital signs reviewed and stable  Post vital signs: Reviewed and stable  Last Vitals:  Vitals Value Taken Time  BP 132/67 01/18/22 1341  Temp    Pulse 55 01/18/22 1343  Resp 25 01/18/22 1343  SpO2 98 % 01/18/22 1343  Vitals shown include unvalidated device data.  Last Pain:  Vitals:   01/18/22 1025  TempSrc:   PainSc: 0-No pain         Complications: No notable events documented.

## 2022-01-18 NOTE — Anesthesia Procedure Notes (Signed)
Procedure Name: MAC Date/Time: 01/18/2022 12:16 PM  Performed by: Janene Harvey, CRNAPre-anesthesia Checklist: Patient identified, Emergency Drugs available, Suction available and Patient being monitored Patient Re-evaluated:Patient Re-evaluated prior to induction Oxygen Delivery Method: Simple face mask Induction Type: IV induction Placement Confirmation: positive ETCO2 Dental Injury: Teeth and Oropharynx as per pre-operative assessment

## 2022-01-18 NOTE — Anesthesia Procedure Notes (Addendum)
Anesthesia Regional Block: Interscalene brachial plexus block   Pre-Anesthetic Checklist: , timeout performed,  Correct Patient, Correct Site, Correct Laterality,  Correct Procedure, Correct Position, site marked,  Risks and benefits discussed,  Surgical consent,  Pre-op evaluation,  At surgeon's request and post-op pain management  Laterality: Upper and Left  Prep: Maximum Sterile Barrier Precautions used, chloraprep       Needles:  Injection technique: Single-shot  Needle Type: Echogenic Needle     Needle Length: 5cm  Needle Gauge: 21     Additional Needles:   Procedures:,,,, ultrasound used (permanent image in chart),,    Narrative:  Start time: 01/18/2022 11:35 AM End time: 01/18/2022 11:42 AM Injection made incrementally with aspirations every 5 mL.  Performed by: Personally  Anesthesiologist: Barnet Glasgow, MD  Additional Notes: Block assessed prior to procedure. Patient tolerated procedure well.

## 2022-01-18 NOTE — Anesthesia Postprocedure Evaluation (Signed)
Anesthesia Post Note  Patient: Tiffany [name]  Procedure(s) Performed: LEFT ARM ARTERIOVENOUS (AV) FISTULA (Left: Arm Upper) THROMBECTOMY TO LEFT CEPHALIC VEIN (Arm Upper)     Patient location during evaluation: PACU Anesthesia Type: MAC and Regional Level of consciousness: awake and alert Pain management: pain level controlled Vital Signs Assessment: post-procedure vital signs reviewed and stable Respiratory status: spontaneous breathing, nonlabored ventilation, respiratory function stable and patient connected to nasal cannula oxygen Cardiovascular status: stable and blood pressure returned to baseline Postop Assessment: no apparent nausea or vomiting Anesthetic complications: no  No notable events documented.  Last Vitals:  Vitals:   01/18/22 1400 01/18/22 1415  BP: (!) 140/70 (!) 151/73  Pulse: (!) 56 (!) 56  Resp: (!) 22 18  Temp:  36.4 C  SpO2: 94% 95%    Last Pain:  Vitals:   01/18/22 1025  TempSrc:   PainSc: 0-No pain                 Barnet Glasgow

## 2022-01-18 NOTE — Op Note (Signed)
Patient name: Tiffany [name] MRN: [id number] DOB: [birth year] Sex: female  01/18/2022 Pre-operative Diagnosis: esrd Post-operative diagnosis:  Same Surgeon:  Erlene Quan C. Donzetta Matters, MD Assistant: Paulo Fruit, PA Procedure Performed: 1.  Left brachial artery to cephalic vein AV fistula creation 2.  Thrombectomy of left cephalic vein with 3 Fogarty   Indications: 71 year old female with history of end-stage renal disease currently dialyzing via catheter.  She is indicated for permanent access.  She is right-hand dominant has marginal vein for fistula creation on the left.  Findings: Cephalic vein at the antecubital was very large measuring approximately 5 mm.  I initially dilated this proximally and then sewed it to the artery and at completion there was severe pulsatility in the fistula.  I then made a transverse fistulotomy and passed a 3 Fogarty and used this the balloon of the fistula throughout the upper arm and at completion of this there was a very strong thrill throughout the fistula there was also a radial artery signal at the wrist that did not augment with compression of the vein.   Procedure:  The patient was identified in the holding area and taken to the operating room where she was placed upon operative table and MAC anesthesia was induced.  She was gently prepped and draped in the left upper extremity usual fashion, antibiotics were ministered a timeout was called.  A preoperative interscalene brachial plexus block had been placed and this was tested and noted to be intact.  The cephalic vein at the antecubitum was marked on the skin with a marking pen and using ultrasound as was the brachial artery which was easily palpable.  A transverse incision was then created and we dissected down to the vein there was some significant densely adherent tissue this was all divided with cautery and the vein branches were divided between clips and ties.  I then dissected through the deep fascia the  artery and placed a vessel loop around this.  The vein was then transected distally and tied off with 2-0 silk suture and then proximally we spatulated this and locally dilated with hemostat flushed with heparinized saline and clamped.  The artery was clamped distally proximally opened longitudinally and flushed with heparinized saline both directions.  The vein was then sewn into side with 6-0 Prolene suture.  Prior completion without flushing all directions.  Upon completion there was a very strong pulse proximally I could not rate the fistula up the arm.  I used ultrasound to trace this did appear to be quite sclerotic towards the upper arm but then again opened up higher up.  I then used a 3 Fogarty and made a transverse fistulotomy after clamping the fistula proximally.  I passed a 3 Fogarty and then I was able to balloon this out through multiple areas and pulled this through the fistula and then there was very strong backbleeding.  I suture-ligated the fistulotomy and then released the clamp.  There was very strong thrill throughout the upper arm and this was traced with Doppler.  There was no full radial artery pulse the wrist but there was a strong signal there.  Satisfied with this we irrigated the wound and obtain hemostasis and closed in layers with Vicryl and Monocryl.  Dermabond was placed at the skin level.  Patient was awakened from anesthesia having tolerated procedure without any complication.  All counts were correct at completion.  An experienced assistant was necessary to facilitate exposure of the vein and the artery as  well as to provide counter tension on the Prolene suture while suturing the anastomosis and assist with closure.   EBL: 100c   Mikea Quadros C. Donzetta Matters, MD [facility] [place] Office: [phone] Pager: [phone]

## 2022-01-19 ENCOUNTER — Encounter (HOSPITAL_COMMUNITY): Payer: Self-pay | Admitting: Vascular Surgery

## 2022-02-11 ENCOUNTER — Other Ambulatory Visit: Payer: Self-pay | Admitting: *Deleted

## 2022-02-11 DIAGNOSIS — N186 End stage renal disease: Secondary | ICD-10-CM

## 2022-02-27 ENCOUNTER — Ambulatory Visit (INDEPENDENT_AMBULATORY_CARE_PROVIDER_SITE_OTHER): Payer: Medicare Other | Admitting: Physician Assistant

## 2022-02-27 ENCOUNTER — Ambulatory Visit (HOSPITAL_COMMUNITY)
Admission: RE | Admit: 2022-02-27 | Discharge: 2022-02-27 | Disposition: A | Discharge status: Home / Self Care | Payer: Medicare Other | Source: Ambulatory Visit | Attending: Vascular Surgery | Admitting: Vascular Surgery

## 2022-02-27 VITALS — BP 139/73 | HR 53 | Temp 98.0°F | Ht 69.5 in | Wt 205.0 lb

## 2022-02-27 DIAGNOSIS — N186 End stage renal disease: Secondary | ICD-10-CM

## 2022-02-27 NOTE — Progress Notes (Signed)
Office Note     CC:  follow up Requesting Provider:  Nolene Ebbs, MD  HPI: Tiffany [name] is a 72 y.o. (12-06-1950) female who presents status post left brachiocephalic fistula creation by Dr. Donzetta Matters on 01/18/2022.  She denies steal symptoms in the left hand.  She believes her incision is well-healed.  She is dialyzing via right IJ [facility] on a Tuesday Thursday Saturday schedule at the [facility] kidney center.   Past Medical History:  Diagnosis Date   ALLERGIC RHINITIS    Anxiety    Arthritis    CHF (congestive heart failure) (HCC)    CKD (chronic kidney disease)    Dialysis T/Th/Sa   Coronary artery disease    05/12/21 - cath/ stent placement   COVID 01/21/2021   + HOME TEST  HAD COUGH CONGESTION AND MALAISE, OCC CONGESTION NOW   COVID 2022   mild   Heart murmur    at birth   Hyperlipidemia    Hypertension    Hypothyroid    Iron deficiency anemia    Obesity    OSA on CPAP    no longer on a cpap   Refusal of blood transfusions as patient is Jehovah's Witness    Shingles 11/2020   LEFT BACK   Type 2 dm with insulin use    checks abg qday runs 125   Wears glasses     Past Surgical History:  Procedure Laterality Date   AV FISTULA PLACEMENT Left 01/18/2022   Procedure: LEFT ARM ARTERIOVENOUS (AV) FISTULA;  Surgeon: Waynetta Sandy, MD;  Location: [place];  Service: Vascular;  Laterality: Left;   BREAST BIOPSY     left breast per pt; benign 20+ yrs ago   [place] PROPOFOL N/A 10/24/2020   Procedure: COLONOSCOPY WITH PROPOFOL;  Surgeon: Ronnette Juniper, MD;  [facility];  Service: Gastroenterology;  Laterality: N/A;   CORONARY STENT INTERVENTION N/A 05/12/2021   Procedure: CORONARY STENT INTERVENTION;  Surgeon: Early Osmond, MD;  Location: [place] [facility];  Service: Cardiovascular;  Laterality: N/A;   CORONARY/GRAFT ACUTE MI REVASCULARIZATION N/A 05/12/2021   Procedure: Coronary/Graft Acute MI Revascularization;   Surgeon: Early Osmond, MD;  Location: [place] [facility];  Service: Cardiovascular;  Laterality: N/A;   IR FLUORO GUIDE CV LINE RIGHT  10/18/2021   IR US GUIDE VASC ACCESS RIGHT  10/18/2021   KNEE SURGERY Bilateral    x 2   LEFT HEART CATH AND CORONARY ANGIOGRAPHY N/A 05/12/2021   Procedure: LEFT HEART CATH AND CORONARY ANGIOGRAPHY;  Surgeon: Early Osmond, MD;  Location: [place] [facility];  Service: Cardiovascular;  Laterality: N/A;   LUMBAR DISC SURGERY     x 2   POLYPECTOMY  10/24/2020   Procedure: POLYPECTOMY;  Surgeon: Ronnette Juniper, MD;  [facility];  Service: Gastroenterology;;   REDUCTION MAMMAPLASTY Bilateral [place] ARTERY  01/18/2022   Procedure: THROMBECTOMY TO LEFT CEPHALIC VEIN;  Surgeon: Waynetta Sandy, MD;  Location: [place];  Service: Vascular;;   TOE SURGERY     yrs ago per pt on 02-02-2021   TOTAL ABDOMINAL HYSTERECTOMY  1997   partial hysterectomy- still has ovaries   TOTAL KNEE ARTHROPLASTY Left 07/01/2019   Procedure: TOTAL KNEE ARTHROPLASTY;  Surgeon: Paralee Cancel, MD;  [facility];  Service: Orthopedics;  Laterality: Left;  70 min NO BLOOD PRODUCTS!!   TREATMENT FISTULA ANAL     yrs  ago per pt 0n 02-02-2021   TRIGGER FINGER RELEASE Left 02/08/2021   Procedure: Left Ring trigger finger release, left ring finger volar ganglion cyst excision;  Surgeon: Orene Desanctis, MD;  Location: [facility];  Service: Orthopedics;  Laterality: Left;  with local anesthesia    Social History   Socioeconomic History   Marital status: Single    Spouse name: Not on file   Number of children: Not on file   Years of education: Not on file   Highest education level: Not on file  Occupational History   Occupation: school bus driver  Tobacco Use   Smoking status: Never   Smokeless tobacco: Never  Vaping Use   Vaping Use: Never used  Substance and Sexual Activity   Alcohol use: Not Currently   Drug use: No   Sexual  activity: Not Currently    Birth control/protection: Surgical  Other Topics Concern   Not on file  Social History Narrative   Not on file   Social Determinants of Health   Financial Resource Strain: Not on file  Food Insecurity: No Food Insecurity (10/18/2021)   Hunger Vital Sign    Worried About Running Out of Food in the Last Year: Never true    Ran Out of Food in the Last Year: Never true  Transportation Needs: No Transportation Needs (10/18/2021)   PRAPARE - Hydrologist (Medical): No    Lack of Transportation (Non-Medical): No  Physical Activity: Not on file  Stress: Not on file  Social Connections: Not on file  Intimate Partner Violence: Not At Risk (10/18/2021)   Humiliation, Afraid, Rape, and Kick questionnaire    Fear of Current or Ex-Partner: No    Emotionally Abused: No    Physically Abused: No    Sexually Abused: No    Family History  Problem Relation Age of Onset   Prostate cancer Father    Congestive Heart Failure Mother    Diabetes Sister    Other Sister        septis   Breast cancer Neg Hx     Current Outpatient Medications  Medication Sig Dispense Refill   acetaminophen (TYLENOL) 650 MG CR tablet Take 650 mg by mouth every 8 (eight) hours as needed for pain.     albuterol (VENTOLIN HFA) 108 (90 Base) MCG/ACT inhaler Inhale 2 puffs into the lungs every 6 (six) hours as needed for wheezing or shortness of breath.     amLODipine (NORVASC) 10 MG tablet Take 10 mg by mouth 2 (two) times daily.     aspirin EC 81 MG tablet Take 81 mg by mouth every evening. Swallow whole.     atorvastatin (LIPITOR) 80 MG tablet TAKE 1 TABLET BY MOUTH EVERYDAY AT BEDTIME 90 tablet 1   B Complex-C (B-COMPLEX WITH VITAMIN C) tablet Take 1 tablet by mouth in the morning.     B Complex-C-Zn-Folic Acid (DIALYVITE/ZINC) TABS Take 1 tablet by mouth daily.     calcitRIOL (ROCALTROL) 0.5 MCG capsule Take 0.5 mcg by mouth in the morning.     carvedilol (COREG)  25 MG tablet Take 25 mg by mouth 2 (two) times daily with a meal.     cetirizine (ZYRTEC) 10 MG tablet Take 10 mg by mouth daily.     clopidogrel (PLAVIX) 75 MG tablet TAKE 1 TABLET BY MOUTH EVERY DAY 90 tablet 1   fluticasone (FLONASE) 50 MCG/ACT nasal spray Place 1 spray into both nostrils daily  as needed for allergies.     hydrALAZINE (APRESOLINE) 50 MG tablet Take 50 mg by mouth 2 (two) times daily.     levothyroxine (SYNTHROID) 200 MCG tablet Take 200 mcg by mouth every morning.     LORazepam (ATIVAN) 1 MG tablet Take 1 mg by mouth daily as needed for anxiety.     Multiple Vitamins-Minerals (PRESERVISION AREDS 2 PO) Take 1 tablet by mouth in the morning.     nitroGLYCERIN (NITROSTAT) 0.4 MG SL tablet Place 1 tablet (0.4 mg total) under the tongue every 5 (five) minutes x 3 doses as needed for chest pain. 25 tablet 2   Omega-3 Fatty Acids (FISH OIL) 1000 MG CAPS Take 1,000 mg by mouth daily.     Propylene Glycol (SYSTANE COMPLETE) 0.6 % SOLN Place 1 drop into both eyes 4 (four) times daily.     traMADol (ULTRAM) 50 MG tablet Take 1 tablet (50 mg total) by mouth every 6 (six) hours as needed. 12 tablet 0   traZODone (DESYREL) 100 MG tablet Take 100 mg by mouth at bedtime.     allopurinol (ZYLOPRIM) 100 MG tablet Take 1 tablet (100 mg total) by mouth daily. 30 tablet 0   No current facility-administered medications for this visit.    Allergies  Allergen Reactions   Other     No blood products   Sulfa Antibiotics Rash     REVIEW OF SYSTEMS:   '[X]'$  denotes positive finding, '[ ]'$  denotes negative finding Cardiac  Comments:  Chest pain or chest pressure:    Shortness of breath upon exertion:    Short of breath when lying flat:    Irregular heart rhythm:        Vascular    Pain in calf, thigh, or hip brought on by ambulation:    Pain in feet at night that wakes you up from your sleep:     Blood clot in your veins:    Leg swelling:         Pulmonary    Oxygen at home:     Productive cough:     Wheezing:         Neurologic    Sudden weakness in arms or legs:     Sudden numbness in arms or legs:     Sudden onset of difficulty speaking or slurred speech:    Temporary loss of vision in one eye:     Problems with dizziness:         Gastrointestinal    Blood in stool:     Vomited blood:         Genitourinary    Burning when urinating:     Blood in urine:        Psychiatric    Major depression:         Hematologic    Bleeding problems:    Problems with blood clotting too easily:        Skin    Rashes or ulcers:        Constitutional    Fever or chills:      PHYSICAL EXAMINATION:  Vitals:   02/27/22 1101  BP: 139/73  Pulse: (!) 53  Temp: 98 F (36.7 C)  TempSrc: Temporal  SpO2: 100%  Weight: 205 lb (93 kg)  Height: 5' 9.5" (1.765 m)    General:  WDWN in NAD; vital signs documented above Gait: Not observed HENT: WNL, normocephalic Pulmonary: normal non-labored breathing , without Rales, rhonchi,  wheezing  Cardiac: regular HR Abdomen: soft, NT, no masses Skin: without rashes Vascular Exam/Pulses: Pulsatile flow near the anastomosis which becomes harder to feel in the mid arm; 1+ left radial pulse Musculoskeletal: no muscle wasting or atrophy  Neurologic: A&O X 3;  No focal weakness or paresthesias are detected Psychiatric:  The pt has Normal affect.   Non-Invasive Vascular Imaging:     +------------+---------------+-------------+-----------+-------------------  ----+  OUTFLOW VEIN  PSV (cm/s)   Diameter (cm)Depth (cm)        Describe           +------------+---------------+-------------+-----------+-------------------  ----+  Shoulder         104          0.31        0.83                              +------------+---------------+-------------+-----------+-------------------  ----+  Prox UA     156 / 685 / 212 0.27 / 0.20 0.81 / 0.88change in  Diameter, prx                                                        to mid . ratio  3.2     +------------+---------------+-------------+-----------+-------------------  ----+  Mid UA            180          0.47        0.89     competing branch  0.34                                                             684 cm/s           +------------+---------------+-------------+-----------+-------------------  ----+  Dist UA           102          0.75        0.32        Branch 14 cm/s        +------------+---------------+-------------+-----------+-------------------  ----+  AC Fossa          163          0.76        0.22                              +------------+---------------+-------------+-----------+-------------------  ----+        Summary:  Patent arteriovenous fistula with increased velocity in the proximal to  mid  upper arm at area of slight narrowing.with a ratio of 3.2. Competing  branch mid  upper arm.     ASSESSMENT/PLAN:: 72 y.o. female status post left brachiocephalic fistula creation  -Patent left brachiocephalic fistula without signs or symptoms of steal syndrome in the left hand -Cephalic vein in the left upper arm is too deep to reliably cannulate.  She will likely require superficialization of her fistula.  However, flow through fistula is pulsatile on exam.  We will proceed with left arm AV fistulogram with possible intervention first.   Risk,  benefits, and alternatives to access surgery were discussed.   The patient is aware the risks include but are not limited to: bleeding, infection, steal syndrome, nerve damage, thrombosis, failure to mature, and need for additional procedures.   The patient agrees to proceed with the procedure.   Dagoberto Ligas, PA-C [facility] [phone]  Clinic MD:   Donzetta Matters

## 2022-02-27 NOTE — H&P (View-Only) (Signed)
Office Note     CC:  follow up Requesting Provider:  Nolene Ebbs, MD  HPI: Tiffany [name] is a 72 y.o. (1950/09/06) female who presents status post left brachiocephalic fistula creation by Dr. Donzetta Matters on 01/18/2022.  She denies steal symptoms in the left hand.  She believes her incision is well-healed.  She is dialyzing via right IJ [facility] on a Tuesday Thursday Saturday schedule at the [facility] kidney center.   Past Medical History:  Diagnosis Date   ALLERGIC RHINITIS    Anxiety    Arthritis    CHF (congestive heart failure) (HCC)    CKD (chronic kidney disease)    Dialysis T/Th/Sa   Coronary artery disease    05/12/21 - cath/ stent placement   COVID 01/21/2021   + HOME TEST  HAD COUGH CONGESTION AND MALAISE, OCC CONGESTION NOW   COVID 2022   mild   Heart murmur    at birth   Hyperlipidemia    Hypertension    Hypothyroid    Iron deficiency anemia    Obesity    OSA on CPAP    no longer on a cpap   Refusal of blood transfusions as patient is Jehovah's Witness    Shingles 11/2020   LEFT BACK   Type 2 dm with insulin use    checks abg qday runs 125   Wears glasses     Past Surgical History:  Procedure Laterality Date   AV FISTULA PLACEMENT Left 01/18/2022   Procedure: LEFT ARM ARTERIOVENOUS (AV) FISTULA;  Surgeon: Waynetta Sandy, MD;  Location: [place];  Service: Vascular;  Laterality: Left;   BREAST BIOPSY     left breast per pt; benign 20+ yrs ago   [place] PROPOFOL N/A 10/24/2020   Procedure: COLONOSCOPY WITH PROPOFOL;  Surgeon: Ronnette Juniper, MD;  [facility];  Service: Gastroenterology;  Laterality: N/A;   CORONARY STENT INTERVENTION N/A 05/12/2021   Procedure: CORONARY STENT INTERVENTION;  Surgeon: Early Osmond, MD;  Location: [place] [facility];  Service: Cardiovascular;  Laterality: N/A;   CORONARY/GRAFT ACUTE MI REVASCULARIZATION N/A 05/12/2021   Procedure: Coronary/Graft Acute MI Revascularization;   Surgeon: Early Osmond, MD;  Location: [place] [facility];  Service: Cardiovascular;  Laterality: N/A;   IR FLUORO GUIDE CV LINE RIGHT  10/18/2021   IR US GUIDE VASC ACCESS RIGHT  10/18/2021   KNEE SURGERY Bilateral    x 2   LEFT HEART CATH AND CORONARY ANGIOGRAPHY N/A 05/12/2021   Procedure: LEFT HEART CATH AND CORONARY ANGIOGRAPHY;  Surgeon: Early Osmond, MD;  Location: [place] [facility];  Service: Cardiovascular;  Laterality: N/A;   LUMBAR DISC SURGERY     x 2   POLYPECTOMY  10/24/2020   Procedure: POLYPECTOMY;  Surgeon: Ronnette Juniper, MD;  [facility];  Service: Gastroenterology;;   REDUCTION MAMMAPLASTY Bilateral [place] ARTERY  01/18/2022   Procedure: THROMBECTOMY TO LEFT CEPHALIC VEIN;  Surgeon: Waynetta Sandy, MD;  Location: [place];  Service: Vascular;;   TOE SURGERY     yrs ago per pt on 02-02-2021   TOTAL ABDOMINAL HYSTERECTOMY  1997   partial hysterectomy- still has ovaries   TOTAL KNEE ARTHROPLASTY Left 07/01/2019   Procedure: TOTAL KNEE ARTHROPLASTY;  Surgeon: Paralee Cancel, MD;  [facility];  Service: Orthopedics;  Laterality: Left;  70 min NO BLOOD PRODUCTS!!   TREATMENT FISTULA ANAL     yrs  ago per pt 0n 02-02-2021   TRIGGER FINGER RELEASE Left 02/08/2021   Procedure: Left Ring trigger finger release, left ring finger volar ganglion cyst excision;  Surgeon: Orene Desanctis, MD;  Location: [facility];  Service: Orthopedics;  Laterality: Left;  with local anesthesia    Social History   Socioeconomic History   Marital status: Single    Spouse name: Not on file   Number of children: Not on file   Years of education: Not on file   Highest education level: Not on file  Occupational History   Occupation: school bus driver  Tobacco Use   Smoking status: Never   Smokeless tobacco: Never  Vaping Use   Vaping Use: Never used  Substance and Sexual Activity   Alcohol use: Not Currently   Drug use: No   Sexual  activity: Not Currently    Birth control/protection: Surgical  Other Topics Concern   Not on file  Social History Narrative   Not on file   Social Determinants of Health   Financial Resource Strain: Not on file  Food Insecurity: No Food Insecurity (10/18/2021)   Hunger Vital Sign    Worried About Running Out of Food in the Last Year: Never true    Ran Out of Food in the Last Year: Never true  Transportation Needs: No Transportation Needs (10/18/2021)   PRAPARE - Hydrologist (Medical): No    Lack of Transportation (Non-Medical): No  Physical Activity: Not on file  Stress: Not on file  Social Connections: Not on file  Intimate Partner Violence: Not At Risk (10/18/2021)   Humiliation, Afraid, Rape, and Kick questionnaire    Fear of Current or Ex-Partner: No    Emotionally Abused: No    Physically Abused: No    Sexually Abused: No    Family History  Problem Relation Age of Onset   Prostate cancer Father    Congestive Heart Failure Mother    Diabetes Sister    Other Sister        septis   Breast cancer Neg Hx     Current Outpatient Medications  Medication Sig Dispense Refill   acetaminophen (TYLENOL) 650 MG CR tablet Take 650 mg by mouth every 8 (eight) hours as needed for pain.     albuterol (VENTOLIN HFA) 108 (90 Base) MCG/ACT inhaler Inhale 2 puffs into the lungs every 6 (six) hours as needed for wheezing or shortness of breath.     amLODipine (NORVASC) 10 MG tablet Take 10 mg by mouth 2 (two) times daily.     aspirin EC 81 MG tablet Take 81 mg by mouth every evening. Swallow whole.     atorvastatin (LIPITOR) 80 MG tablet TAKE 1 TABLET BY MOUTH EVERYDAY AT BEDTIME 90 tablet 1   B Complex-C (B-COMPLEX WITH VITAMIN C) tablet Take 1 tablet by mouth in the morning.     B Complex-C-Zn-Folic Acid (DIALYVITE/ZINC) TABS Take 1 tablet by mouth daily.     calcitRIOL (ROCALTROL) 0.5 MCG capsule Take 0.5 mcg by mouth in the morning.     carvedilol (COREG)  25 MG tablet Take 25 mg by mouth 2 (two) times daily with a meal.     cetirizine (ZYRTEC) 10 MG tablet Take 10 mg by mouth daily.     clopidogrel (PLAVIX) 75 MG tablet TAKE 1 TABLET BY MOUTH EVERY DAY 90 tablet 1   fluticasone (FLONASE) 50 MCG/ACT nasal spray Place 1 spray into both nostrils daily  as needed for allergies.     hydrALAZINE (APRESOLINE) 50 MG tablet Take 50 mg by mouth 2 (two) times daily.     levothyroxine (SYNTHROID) 200 MCG tablet Take 200 mcg by mouth every morning.     LORazepam (ATIVAN) 1 MG tablet Take 1 mg by mouth daily as needed for anxiety.     Multiple Vitamins-Minerals (PRESERVISION AREDS 2 PO) Take 1 tablet by mouth in the morning.     nitroGLYCERIN (NITROSTAT) 0.4 MG SL tablet Place 1 tablet (0.4 mg total) under the tongue every 5 (five) minutes x 3 doses as needed for chest pain. 25 tablet 2   Omega-3 Fatty Acids (FISH OIL) 1000 MG CAPS Take 1,000 mg by mouth daily.     Propylene Glycol (SYSTANE COMPLETE) 0.6 % SOLN Place 1 drop into both eyes 4 (four) times daily.     traMADol (ULTRAM) 50 MG tablet Take 1 tablet (50 mg total) by mouth every 6 (six) hours as needed. 12 tablet 0   traZODone (DESYREL) 100 MG tablet Take 100 mg by mouth at bedtime.     allopurinol (ZYLOPRIM) 100 MG tablet Take 1 tablet (100 mg total) by mouth daily. 30 tablet 0   No current facility-administered medications for this visit.    Allergies  Allergen Reactions   Other     No blood products   Sulfa Antibiotics Rash     REVIEW OF SYSTEMS:   '[X]'$  denotes positive finding, '[ ]'$  denotes negative finding Cardiac  Comments:  Chest pain or chest pressure:    Shortness of breath upon exertion:    Short of breath when lying flat:    Irregular heart rhythm:        Vascular    Pain in calf, thigh, or hip brought on by ambulation:    Pain in feet at night that wakes you up from your sleep:     Blood clot in your veins:    Leg swelling:         Pulmonary    Oxygen at home:     Productive cough:     Wheezing:         Neurologic    Sudden weakness in arms or legs:     Sudden numbness in arms or legs:     Sudden onset of difficulty speaking or slurred speech:    Temporary loss of vision in one eye:     Problems with dizziness:         Gastrointestinal    Blood in stool:     Vomited blood:         Genitourinary    Burning when urinating:     Blood in urine:        Psychiatric    Major depression:         Hematologic    Bleeding problems:    Problems with blood clotting too easily:        Skin    Rashes or ulcers:        Constitutional    Fever or chills:      PHYSICAL EXAMINATION:  Vitals:   02/27/22 1101  BP: 139/73  Pulse: (!) 53  Temp: 98 F (36.7 C)  TempSrc: Temporal  SpO2: 100%  Weight: 205 lb (93 kg)  Height: 5' 9.5" (1.765 m)    General:  WDWN in NAD; vital signs documented above Gait: Not observed HENT: WNL, normocephalic Pulmonary: normal non-labored breathing , without Rales, rhonchi,  wheezing  Cardiac: regular HR Abdomen: soft, NT, no masses Skin: without rashes Vascular Exam/Pulses: Pulsatile flow near the anastomosis which becomes harder to feel in the mid arm; 1+ left radial pulse Musculoskeletal: no muscle wasting or atrophy  Neurologic: A&O X 3;  No focal weakness or paresthesias are detected Psychiatric:  The pt has Normal affect.   Non-Invasive Vascular Imaging:     +------------+---------------+-------------+-----------+-------------------  ----+  OUTFLOW VEIN  PSV (cm/s)   Diameter (cm)Depth (cm)        Describe           +------------+---------------+-------------+-----------+-------------------  ----+  Shoulder         104          0.31        0.83                              +------------+---------------+-------------+-----------+-------------------  ----+  Prox UA     156 / 685 / 212 0.27 / 0.20 0.81 / 0.88change in  Diameter, prx                                                        to mid . ratio  3.2     +------------+---------------+-------------+-----------+-------------------  ----+  Mid UA            180          0.47        0.89     competing branch  0.34                                                             684 cm/s           +------------+---------------+-------------+-----------+-------------------  ----+  Dist UA           102          0.75        0.32        Branch 14 cm/s        +------------+---------------+-------------+-----------+-------------------  ----+  AC Fossa          163          0.76        0.22                              +------------+---------------+-------------+-----------+-------------------  ----+        Summary:  Patent arteriovenous fistula with increased velocity in the proximal to  mid  upper arm at area of slight narrowing.with a ratio of 3.2. Competing  branch mid  upper arm.     ASSESSMENT/PLAN:: 72 y.o. female status post left brachiocephalic fistula creation  -Patent left brachiocephalic fistula without signs or symptoms of steal syndrome in the left hand -Cephalic vein in the left upper arm is too deep to reliably cannulate.  She will likely require superficialization of her fistula.  However, flow through fistula is pulsatile on exam.  We will proceed with left arm AV fistulogram with possible intervention first.   Risk,  benefits, and alternatives to access surgery were discussed.   The patient is aware the risks include but are not limited to: bleeding, infection, steal syndrome, nerve damage, thrombosis, failure to mature, and need for additional procedures.   The patient agrees to proceed with the procedure.   Dagoberto Ligas, PA-C [facility] [phone]  Clinic MD:   Donzetta Matters

## 2022-03-04 ENCOUNTER — Other Ambulatory Visit: Payer: Self-pay

## 2022-03-04 ENCOUNTER — Telehealth: Payer: Self-pay

## 2022-03-04 DIAGNOSIS — N186 End stage renal disease: Secondary | ICD-10-CM

## 2022-03-04 MED ORDER — SODIUM CHLORIDE 0.9 % IV SOLN: 250.0000 mL | INTRAVENOUS | Status: AC | PRN

## 2022-03-04 MED ORDER — SODIUM CHLORIDE 0.9% FLUSH: 3.0000 mL | Freq: Two times a day (BID) | INTRAVENOUS | Status: AC

## 2022-03-04 NOTE — Telephone Encounter (Signed)
Spoke with patient and scheduled fistulogram on 03/11/22. Instructions provided and patient verbalized understanding.

## 2022-03-04 NOTE — Telephone Encounter (Signed)
Called pt to schedule procedure; left vm asking for her to return the call.

## 2022-03-11 ENCOUNTER — Ambulatory Visit (HOSPITAL_COMMUNITY)
Admission: RE | Admit: 2022-03-11 | Discharge: 2022-03-11 | Disposition: A | Discharge status: Home / Self Care | Payer: Medicare Other | Attending: Vascular Surgery | Admitting: Vascular Surgery

## 2022-03-11 ENCOUNTER — Other Ambulatory Visit: Payer: Self-pay

## 2022-03-11 ENCOUNTER — Encounter (HOSPITAL_COMMUNITY)
Admission: RE | Disposition: A | Discharge status: Home / Self Care | Payer: Self-pay | Source: Home / Self Care | Attending: Vascular Surgery

## 2022-03-11 DIAGNOSIS — Z992 Dependence on renal dialysis: Secondary | ICD-10-CM | POA: Diagnosis not present

## 2022-03-11 DIAGNOSIS — Y832 Surgical operation with anastomosis, bypass or graft as the cause of abnormal reaction of the patient, or of later complication, without mention of misadventure at the time of the procedure: Secondary | ICD-10-CM | POA: Diagnosis not present

## 2022-03-11 DIAGNOSIS — T82858A Stenosis of vascular prosthetic devices, implants and grafts, initial encounter: Secondary | ICD-10-CM

## 2022-03-11 DIAGNOSIS — Z8249 Family history of ischemic heart disease and other diseases of the circulatory system: Secondary | ICD-10-CM | POA: Diagnosis not present

## 2022-03-11 DIAGNOSIS — N186 End stage renal disease: Secondary | ICD-10-CM | POA: Insufficient documentation

## 2022-03-11 DIAGNOSIS — E1122 Type 2 diabetes mellitus with diabetic chronic kidney disease: Secondary | ICD-10-CM | POA: Insufficient documentation

## 2022-03-11 DIAGNOSIS — N185 Chronic kidney disease, stage 5: Secondary | ICD-10-CM

## 2022-03-11 DIAGNOSIS — Z833 Family history of diabetes mellitus: Secondary | ICD-10-CM | POA: Insufficient documentation

## 2022-03-11 DIAGNOSIS — T82898A Other specified complication of vascular prosthetic devices, implants and grafts, initial encounter: Secondary | ICD-10-CM

## 2022-03-11 HISTORY — PX: PERIPHERAL VASCULAR BALLOON ANGIOPLASTY: CATH118281

## 2022-03-11 HISTORY — PX: A/V FISTULAGRAM: CATH118298

## 2022-03-11 LAB — POCT I-STAT, CHEM 8
BUN: 60 mg/dL — ABNORMAL HIGH (ref 8–23)
Calcium, Ion: 1.14 mmol/L — ABNORMAL LOW (ref 1.15–1.40)
Chloride: 100 mmol/L (ref 98–111)
Creatinine, Ser: 5.2 mg/dL — ABNORMAL HIGH (ref 0.44–1.00)
Glucose, Bld: 172 mg/dL — ABNORMAL HIGH (ref 70–99)
HCT: 38 % (ref 36.0–46.0)
Hemoglobin: 12.9 g/dL (ref 12.0–15.0)
Potassium: 4.8 mmol/L (ref 3.5–5.1)
Sodium: 137 mmol/L (ref 135–145)
TCO2: 31 mmol/L (ref 22–32)

## 2022-03-11 SURGERY — A/V FISTULAGRAM
Anesthesia: LOCAL | Laterality: Left

## 2022-03-11 MED ORDER — LIDOCAINE HCL (PF) 1 % IJ SOLN
INTRAMUSCULAR | Status: AC
  Filled 2022-03-11: qty 30

## 2022-03-11 MED ORDER — LIDOCAINE HCL (PF) 1 % IJ SOLN
INTRAMUSCULAR | Status: DC | PRN
  Administered 2022-03-11: 10 mL

## 2022-03-11 MED ORDER — HEPARIN (PORCINE) IN NACL 1000-0.9 UT/500ML-% IV SOLN
INTRAVENOUS | Status: AC
  Filled 2022-03-11: qty 500

## 2022-03-11 MED ORDER — HEPARIN (PORCINE) IN NACL 1000-0.9 UT/500ML-% IV SOLN
INTRAVENOUS | Status: DC | PRN
  Administered 2022-03-11: 500 mL

## 2022-03-11 MED ORDER — IODIXANOL 320 MG/ML IV SOLN
INTRAVENOUS | Status: DC | PRN
  Administered 2022-03-11: 65 mL

## 2022-03-11 MED ORDER — SODIUM CHLORIDE 0.9% FLUSH: 3.0000 mL | INTRAVENOUS | Status: DC | PRN

## 2022-03-11 SURGICAL SUPPLY — 14 items
BAG SNAP BAND KOVER 36X36 (MISCELLANEOUS) ×2 IMPLANT
BALLN MUSTANG 5X80X75 (BALLOONS) ×2
BALLOON MUSTANG 5X80X75 (BALLOONS) IMPLANT
CATH ANGIO 5F BER2 65CM (CATHETERS) IMPLANT
COVER DOME SNAP 22 D (MISCELLANEOUS) ×2 IMPLANT
KIT MICROPUNCTURE NIT STIFF (SHEATH) IMPLANT
PROTECTION STATION PRESSURIZED (MISCELLANEOUS) ×2
SHEATH PINNACLE R/O II 6F 4CM (SHEATH) IMPLANT
SHEATH PROBE COVER 6X72 (BAG) ×2 IMPLANT
STATION PROTECTION PRESSURIZED (MISCELLANEOUS) ×2 IMPLANT
STOPCOCK MORSE 400PSI 3WAY (MISCELLANEOUS) ×2 IMPLANT
TRAY PV CATH (CUSTOM PROCEDURE TRAY) ×2 IMPLANT
TUBING CIL FLEX 10 FLL-RA (TUBING) ×2 IMPLANT
WIRE BENTSON .035X145CM (WIRE) IMPLANT

## 2022-03-11 NOTE — Op Note (Addendum)
    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 Donzetta Matters, MD Procedure Performed: 1.  Ultrasound-guided cannulation left arm AV fistula 2.  Left upper extremity fistulogram 3.  Balloon assisted maturation left cephalic vein fistula with 5 mm balloon     Indications: 72 year old female has undergone left brachial artery to cephalic vein fistula which is failed to mature.  She is now indicated for fistulogram with possible intervention.  She is currently dialyzing via catheter which is functioning well.  Findings: Fistula anastomosis was patent and the fistula measured approximately 7 mm just after this but then was very diminutive measuring approximately 3 mm in the mid segment and there was a tight stenosis at the cephalic arch.  After 5 mm balloon angioplasty there is a much improved thrill and a 5 mm fistula throughout the upper extremity.  Plan will be to follow-up in the office in 3 to 4 weeks with repeat dialysis duplex and can plan superficialization of the fistula then if flow has improved beyond 700 mL/min which it previously was.   Procedure:  The patient was identified in the holding area and taken to room 8.  The patient was then placed supine on the table and prepped and draped in the usual sterile fashion.  A time out was called.  Ultrasound was used to evaluate the left arm AV fistula.  I was able to evaluate the anastomosis which appeared patent.  I then numbed the area with 1% lidocaine and cannulated the fistula directly with ultrasound guidance using micropuncture needle followed by wire and sheath.  And images saved the permanent record.  Performed left upper extremity fistulogram.  With the above findings we placed a Bentson wire followed by a 6 French sheath.  I was then able to traverse the fistula in  the cephalic arch using Berenstein catheter and Bentson wire.  We then performed balloon angioplasty of the fistula starting from the subclavian vein all the way back to near the anastomosis with 5 mm balloon.  Completion demonstrated much improved flow and sizing of the fistula throughout its course and there was a much stronger thrill.  We did perform retrograde imaging with the balloon inflated which also demonstrated patent anastomosis.  Satisfied with this we suture-ligated the cannulation site and remove the catheters and wires.  Patient tolerated procedure without any complication.  Contrast: 60cc   Latera Mclin C. Donzetta Matters, MD [facility] Office: [phone] Pager: [phone]

## 2022-03-11 NOTE — Interval H&P Note (Signed)
History and Physical Interval Note:  03/11/2022 9:39 AM  Tiffany [name]  has presented today for surgery, with the diagnosis of instage renal.  The various methods of treatment have been discussed with the patient and family. After consideration of risks, benefits and other options for treatment, the patient has consented to  Procedure(s): A/V Fistulagram (Left) as a surgical intervention.  The patient's history has been reviewed, patient examined, no change in status, stable for surgery.  I have reviewed the patient's chart and labs.  Questions were answered to the patient's satisfaction.     [name]

## 2022-03-12 ENCOUNTER — Encounter (HOSPITAL_COMMUNITY): Payer: Self-pay | Admitting: Vascular Surgery

## 2022-03-14 ENCOUNTER — Encounter (HOSPITAL_COMMUNITY): Payer: Self-pay

## 2022-03-20 ENCOUNTER — Ambulatory Visit: Payer: Medicare Other | Admitting: Podiatry

## 2022-03-20 ENCOUNTER — Encounter: Payer: Self-pay | Admitting: Podiatry

## 2022-03-20 ENCOUNTER — Ambulatory Visit (INDEPENDENT_AMBULATORY_CARE_PROVIDER_SITE_OTHER): Payer: Medicare Other

## 2022-03-20 DIAGNOSIS — M7752 Other enthesopathy of left foot: Secondary | ICD-10-CM

## 2022-03-20 DIAGNOSIS — I999 Unspecified disorder of circulatory system: Secondary | ICD-10-CM

## 2022-03-20 DIAGNOSIS — M79671 Pain in right foot: Secondary | ICD-10-CM | POA: Diagnosis not present

## 2022-03-20 DIAGNOSIS — M76821 Posterior tibial tendinitis, right leg: Secondary | ICD-10-CM

## 2022-03-20 DIAGNOSIS — M7751 Other enthesopathy of right foot: Secondary | ICD-10-CM

## 2022-03-20 DIAGNOSIS — M79672 Pain in left foot: Secondary | ICD-10-CM

## 2022-03-20 MED ORDER — TRIAMCINOLONE ACETONIDE 10 MG/ML IJ SUSP
10.0000 mg | Freq: Once | INTRAMUSCULAR | Status: AC
  Administered 2022-03-20: 10 mg

## 2022-03-20 NOTE — Progress Notes (Signed)
Subjective:   Patient ID: Tiffany [name], female   DOB: 72 y.o.   MRN: [id number]   HPI Patient presents with a lot of pain around the left big toe joint of 6 months duration and been getting some pain in the right ankle.  States she is not very good health is on dialysis and hopefully then entered the transplant list and has had heart issues in the past.  Patient does not smoke likes to be active if possible   Review of Systems  All other systems reviewed and are negative.       Objective:  Physical Exam Vitals and nursing note reviewed.  Constitutional:      Appearance: She is well-developed.  Pulmonary:     Effort: Pulmonary effort is normal.  Musculoskeletal:        General: Normal range of motion.  Skin:    General: Skin is warm.  Neurological:     Mental Status: She is alert.     Neurovascular status was found to be mildly diminished but intact bilateral diminished range of motion subtalar midtarsal joint inflammation fluid around the first MPJ left mild discomfort noted with patient found to have right medial ankle mildly tender when pressed.  Patient is in relative poor health and is on dialysis currently along with heart issues     Assessment:  At risk patient with multiple issues long-term diabetes under good control now but does have unfortunately probable vascular issues along with kidney issues and heart issues     Plan:  H&P reviewed all conditions.  For the left I did go ahead I did sterile prep I injected around the joint 3 mg Kenalog 5 mg Xylocaine and advised on wider shoes do not recommend treatment of the right 1 currently and I then went ahead and discussed calcification of arteries on the x-rays and I want her to continue to see her cardiologist and make sure to try to stay ahead of things.  Patient will be seen back as needed  X-rays indicate moderate osteoporosis and does have calcification dorsalis pedis and other arteries distal bilateral

## 2022-03-21 ENCOUNTER — Other Ambulatory Visit: Payer: Self-pay | Admitting: Podiatry

## 2022-03-21 DIAGNOSIS — M7752 Other enthesopathy of left foot: Secondary | ICD-10-CM

## 2022-03-21 DIAGNOSIS — I999 Unspecified disorder of circulatory system: Secondary | ICD-10-CM

## 2022-03-21 DIAGNOSIS — M79671 Pain in right foot: Secondary | ICD-10-CM

## 2022-03-21 DIAGNOSIS — M76821 Posterior tibial tendinitis, right leg: Secondary | ICD-10-CM

## 2022-03-22 ENCOUNTER — Other Ambulatory Visit: Payer: Self-pay | Admitting: *Deleted

## 2022-03-22 DIAGNOSIS — N186 End stage renal disease: Secondary | ICD-10-CM

## 2022-04-03 ENCOUNTER — Encounter (HOSPITAL_COMMUNITY): Payer: Self-pay

## 2022-04-08 NOTE — Progress Notes (Unsigned)
Cardiology Office Note:    Date:  04/08/2022   ID:  Tiffany [name], DOB Dec 25, 1950, MRN [id number]  PCP:  Nolene Ebbs, MD   [place] Providers Cardiologist:  Lenna Sciara, MD Referring MD: Nolene Ebbs, MD   Chief Complaint/Reason for Referral:  Cardiology follow up  ASSESSMENT:    1. ST elevation myocardial infarction involving right coronary artery ([place])   2. Hyperlipidemia LDL goal <70   3. Essential hypertension   4. ESRD (end stage renal disease) ([place])   5. Aortic atherosclerosis ([place])   6. BMI 37.0-37.9, adult   7. Chronic diastolic heart failure ([facility])     PLAN:    In order of problems listed above: 1.  ST elevation myocardial infarction: The patient is most 1 year out from her acute coronary syndrome.  On May 1 patient will stop aspirin and continue Plavix monotherapy indefinitely. 2.  Hyperlipidemia: Will check lipid panel, LFTs, LP(a) today. 3.  Hypertension:  4.  End-stage renal disease: 5.  Aortic atherosclerosis: Continue aspirin, statin, and blood pressure control. 6.  Elevated BMI: We will refer to pharmacy for further recommendations. 7.  Diastolic heart failure: Patient is not a candidate for SGLT2 inhibitor.  Her volume status will need to be managed by dialysis.  Continue Coreg 25 twice daily and hydralazine.         {Are you ordering a CV Procedure (e.g. stress test, cath, DCCV, TEE, etc)?   Press F2        :[id number]   Dispo:  No follow-ups on file.      Medication Adjustments/Labs and Tests Ordered: Current medicines are reviewed at length with the patient today.  Concerns regarding medicines are outlined above.  The following changes have been made:  {PLAN; NO CHANGE:13088:s}   Labs/tests ordered: No orders of the defined types were placed in this encounter.   Medication Changes: No orders of the defined types were placed in this encounter.    Current medicines are reviewed at length with the patient today.  The patient  {ACTIONS; HAS/DOES NOT HAVE:19233} concerns regarding medicines.   History of Present Illness:    FOCUSED PROBLEM LIST:   CAD with inferior STEMI April 2023 treated with 2 DES PDA and 1 DES distal RCA into PDA Hyperlipidemia Hypertension ESRD Aortic atherosclerosis on CT 2022 Obstructive sleep apnea not on CPAP BMI 37 [employer] Hypothyroidism  The patient is a 72 y.o. female with the indicated medical history here for routine cardiology follow-up.  She was last seen in the cardiology division in September.  She reported shortness of breath with ambulation.  Blood pressure was noted to be 138/62 at that visit.  She is eventually transition to renal replacement therapy due to worsening kidney function.          Current Medications: No outpatient medications have been marked as taking for the 04/17/22 encounter (Appointment) with Early Osmond, MD.   Current Facility-Administered Medications for the 04/17/22 encounter (Appointment) with Early Osmond, MD  Medication   0.9 %  sodium chloride infusion   sodium chloride flush (NS) 0.9 % injection 3 mL     Allergies:    Other and Sulfa antibiotics   Social History:   Social History   Tobacco Use   Smoking status: Never   Smokeless tobacco: Never  Vaping Use   Vaping Use: Never used  Substance Use Topics   Alcohol use: Not Currently   Drug use: No     Family  Hx: Family History  Problem Relation Age of Onset   Prostate cancer Father    Congestive Heart Failure Mother    Diabetes Sister    Other Sister        septis   Breast cancer Neg Hx      Review of Systems:   Please see the history of present illness.    All other systems reviewed and are negative.     EKGs/Labs/Other Test Reviewed:    EKG:  EKG performed September 2023 that I personally reviewed demonstrates sinus bradycardia with a low voltages; EKG performed today that I personally reviewed demonstrates ***.  Prior CV studies:  Cardiac  Studies & Procedures   CARDIAC CATHETERIZATION  CARDIAC CATHETERIZATION 05/12/2021  Narrative   RPDA lesion is 100% stenosed.   Dist RCA lesion is 80% stenosed.   A stent was successfully placed.   A stent was successfully placed.   Post intervention, there is a 0% residual stenosis.   Post intervention, there is a 0% residual stenosis.   LV end diastolic pressure is normal.  1.  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 2.  LVEDP of 11 mmHg.  Recommendations: Aggrastat infusion for 18 hours and dual antiplatelet therapy for at least 1 year.  Given the patient's chronic kidney disease this will need to be monitored over the next few days and the need for renal replacement therapy assessed.  Findings Coronary Findings Diagnostic  Dominance: Right  Right Coronary Artery Dist RCA lesion is 80% stenosed.  Right Posterior Descending Artery RPDA lesion is 100% stenosed.  Intervention  Dist RCA lesion Stent A stent was successfully placed. Post-Intervention Lesion Assessment The intervention was successful. Pre-interventional TIMI flow is 0. Post-intervention TIMI flow is 3. There is a 0% residual stenosis post intervention.  RPDA lesion Stent A stent was successfully placed. Post-Intervention Lesion Assessment The intervention was successful. Pre-interventional TIMI flow is 0. Post-intervention TIMI flow is 3. There is a 0% residual stenosis post intervention.   STRESS TESTS  MYOCARDIAL PERFUSION IMAGING 01/27/2020  Narrative  The left ventricular ejection fraction is hyperdynamic (>65%).  Nuclear stress EF: 67%.  There was no ST segment deviation noted during stress.  No T wave inversion was noted during stress.  The study is normal.  This is a low risk study.  [name], MD   ECHOCARDIOGRAM  ECHOCARDIOGRAM COMPLETE 05/12/2021  Narrative ECHOCARDIOGRAM REPORT    Patient Name:   [facility] Tiffany [name] Date of Exam: 05/12/2021 Medical Rec #:  [id number]         Height:       69.0 in Accession #:    [id number]        Weight:       214.1 lb Date of Birth:  [birth year]          BSA:          2.127 m Patient Age:    35 years          BP:           148/78 mmHg Patient Gender: F                 HR:           69 bpm. Exam Location:  Inpatient  Procedure: 2D Echo, Color Doppler and Cardiac  Doppler  Indications:    Acute myocardial infarction i21.9  History:        Patient has prior history of Echocardiogram examinations, most recent 04/27/2020. CHF; Risk Factors:Hypertension, Diabetes, Dyslipidemia and Sleep Apnea.  Sonographer:    Raquel Sarna Senior RDCS Referring Phys: [id number] [facility]   1. Left ventricular ejection fraction, by estimation, is 70 to 75%. The left ventricle has hyperdynamic function. The left ventricle has no regional wall motion abnormalities. There is severe concentric left ventricular hypertrophy. Left ventricular diastolic function could not be evaluated. 2. Moderate calcific mitral valve disease is present. MG 6.3 mmHG @ 69 bpm. There is calcified chordal tissue under the MV which does demonstrate some mobility. 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 The mitral valve is degenerative. Trivial mitral valve regurgitation. Moderate mitral stenosis. The mean mitral valve gradient is 6.3 mmHg with average heart rate of 69 bpm. 3. Right ventricular systolic function is normal. The right ventricular size is normal. There is mildly elevated pulmonary artery systolic pressure. The estimated right ventricular systolic pressure is 0000000 mmHg. 4. The aortic valve is tricuspid. There is moderate calcification of the aortic valve. There is moderate thickening of the aortic valve. Aortic valve  regurgitation is not visualized. Mild aortic valve stenosis. Aortic valve area, by VTI measures 2.04 cm. Aortic valve mean gradient measures 11.0 mmHg. Aortic valve Vmax measures 2.36 m/s. 5. Left atrial size was severely dilated. 6. Right atrial size was mildly dilated. 7. A small pericardial effusion is present. The pericardial effusion is circumferential. 8. The inferior vena cava is dilated in size with <50% respiratory variability, suggesting right atrial pressure of 15 mmHg.  Comparison(s): No significant change from prior study.  FINDINGS Left Ventricle: Left ventricular ejection fraction, by estimation, is 70 to 75%. The left ventricle has hyperdynamic function. The left ventricle has no regional wall motion abnormalities. The left ventricular internal cavity size was normal in size. There is severe concentric left ventricular hypertrophy. Left ventricular diastolic function could not be evaluated due to mitral annular calcification (moderate or greater). Left ventricular diastolic function could not be evaluated. The ratio of pulmonic flow to systemic flow (Qp/Qs ratio) is 0.90.  Right Ventricle: The right ventricular size is normal. No increase in right ventricular wall thickness. Right ventricular systolic function is normal. There is mildly elevated pulmonary artery systolic pressure. The tricuspid regurgitant velocity is 2.52 m/s, and with an assumed right atrial pressure of 15 mmHg, the estimated right ventricular systolic pressure is 0000000 mmHg.  Left Atrium: Left atrial size was severely dilated.  Right Atrium: Right atrial size was mildly dilated.  Pericardium: A small pericardial effusion is present. The pericardial effusion is circumferential.  Mitral Valve: Moderate calcific mitral valve disease is present. MG 6.3 mmHG @ 69 bpm. There is calcified chordal tissue under the MV which does demonstrate some mobility. 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 The mitral valve is degenerative in appearance. Trivial mitral valve regurgitation. Moderate mitral valve stenosis. MV peak gradient, 14.1 mmHg. The mean mitral valve gradient is 6.3 mmHg with average heart rate of 69 bpm.  Tricuspid Valve: The tricuspid valve is grossly normal. Tricuspid valve regurgitation is mild . No evidence of tricuspid stenosis.  Aortic Valve: The aortic valve is tricuspid. There is moderate calcification of the aortic valve. There is moderate thickening of the aortic valve. Aortic valve regurgitation is not visualized. Mild aortic stenosis is present. Aortic  valve mean gradient measures 11.0 mmHg. Aortic valve peak gradient measures 22.3 mmHg. Aortic valve area, by VTI measures 2.04 cm.  Pulmonic Valve: The pulmonic valve was grossly normal. Pulmonic valve regurgitation is mild. No evidence of pulmonic stenosis.  Aorta: The aortic root is normal in size and structure.  Venous: The inferior vena cava is dilated in size with less than 50% respiratory variability, suggesting right atrial pressure of 15 mmHg.  IAS/Shunts: The atrial septum is grossly normal. The ratio of pulmonic flow to systemic flow (Qp/Qs ratio) is 0.90.   LEFT VENTRICLE PLAX 2D LVIDd:         4.20 cm   Diastology LVIDs:         2.10 cm   LV e' medial:    4.35 cm/s LV PW:         1.70 cm   LV E/e' medial:  32.0 LV IVS:        1.50 cm   LV e' lateral:   5.10 cm/s LVOT diam:     1.90 cm   LV E/e' lateral: 27.3 LV SV:         93 LV SV Index:   44 LVOT Area:     2.84 cm   RIGHT VENTRICLE RV S prime:     14.60 cm/s RVOT diam:      1.90 cm TAPSE (M-mode): 2.7 cm  LEFT ATRIUM              Index        RIGHT ATRIUM           Index LA diam:        5.30 cm  2.49 cm/m   RA Area:     19.60 cm LA Vol (A2C):   145.0 ml 68.19 ml/m  RA Volume:   52.70 ml  24.78 ml/m LA Vol (A4C):   104.0 ml 48.91 ml/m LA Biplane Vol: 123.0 ml 57.84 ml/m AORTIC VALVE                     PULMONIC  VALVE AV Area (Vmax):    2.01 cm      PV Area (Vmax):   1.91 cm AV Area (Vmean):   2.23 cm      PV Area (Vmean):  2.16 cm AV Area (VTI):     2.04 cm      PV Area (VTI):    1.93 cm AV Vmax:           236.00 cm/s   PV Vmax:          1.87 m/s AV Vmean:          155.000 cm/s  PV Vmean:         127.667 cm/s AV VTI:            0.456 m       PV VTI:           0.443 m AV Peak Grad:      22.3 mmHg     PV Peak grad:     13.9 mmHg AV Mean Grad:      11.0 mmHg     PV Mean grad:     7.3 mmHg LVOT Vmax:         167.00 cm/s   PR End Diast Vel: 11.02 msec LVOT Vmean:        122.000 cm/s  RVOT Peak grad:   6 mmHg LVOT VTI:  0.328 m LVOT/AV VTI ratio: 0.72  AORTA Ao Root diam: 3.50 cm Ao Asc diam:  3.20 cm  MITRAL VALVE                TRICUSPID VALVE MV Area (PHT): 1.55 cm     TR Peak grad:   25.4 mmHg MV Area VTI:   1.59 cm     TR Vmax:        252.00 cm/s MV Peak grad:  14.1 mmHg MV Mean grad:  6.3 mmHg     SHUNTS MV Vmax:       1.88 m/s     Systemic VTI:  0.33 m MV Vmean:      118.3 cm/s   Systemic Diam: 1.90 cm MV VTI:        0.58 m       Pulmonic VTI:  0.302 m MV Decel Time: 491 msec     Pulmonic Diam: 1.90 cm MV E velocity: 139.00 cm/s  Qp/Qs:         0.92 MV A velocity: 174.00 cm/s MV E/A ratio:  0.80  Eleonore Chiquito MD Electronically signed by Eleonore Chiquito MD Signature Date/Time: 05/12/2021/11:55:04 AM    Final             Other studies Reviewed: Review of the additional studies/records demonstrates: CT A/P 2022 with aortic atherosclerosis  Recent Labs: 05/28/2021: TSH 5.72 10/17/2021: B Natriuretic Peptide 862.8 10/18/2021: ALT 15 10/21/2021: Magnesium 1.8 10/24/2021: Platelets 84 03/11/2022: BUN 60; Creatinine, Ser 5.20; Hemoglobin 12.9; Potassium 4.8; Sodium 137   Recent Lipid Panel Lab Results  Component Value Date/Time   CHOL 151 05/11/2021 11:39 PM   TRIG 51 05/11/2021 11:39 PM   HDL 45 05/11/2021 11:39 PM   LDLCALC 96 05/11/2021 11:39 PM    Risk  Assessment/Calculations:    {Does this patient have ATRIAL FIBRILLATION?:[id number]}      No BP recorded.  {Refresh Note OR Click here to enter BP  :1}***    Physical Exam:    VS:  There were no vitals taken for this visit.   Wt Readings from Last 3 Encounters:  03/11/22 215 lb (97.5 kg)  02/27/22 205 lb (93 kg)  01/18/22 213 lb (96.6 kg)    GENERAL:  No apparent distress, AOx3 HEENT:  No carotid bruits, +2 carotid impulses, no scleral icterus CAR: RRR Irregular RR*** no murmurs***, gallops, rubs, or thrills RES:  Clear to auscultation bilaterally ABD:  Soft, nontender, nondistended, positive bowel sounds x 4 VASC:  +2 radial pulses, +2 carotid pulses, palpable pedal pulses NEURO:  CN 2-12 grossly intact; motor and sensory grossly intact PSYCH:  No active depression or anxiety EXT:  No edema, ecchymosis, or cyanosis  Signed, Early Osmond, MD  04/08/2022 10:55 AM    [address], [place], [place]  [address] Phone: [phone]; Fax: [phone]   Note:  This document was prepared using Dragon voice recognition software and may include unintentional dictation errors.

## 2022-04-10 ENCOUNTER — Ambulatory Visit (INDEPENDENT_AMBULATORY_CARE_PROVIDER_SITE_OTHER): Payer: Medicare Other | Admitting: Physician Assistant

## 2022-04-10 ENCOUNTER — Ambulatory Visit (HOSPITAL_COMMUNITY)
Admission: RE | Admit: 2022-04-10 | Discharge: 2022-04-10 | Disposition: A | Discharge status: Home / Self Care | Payer: Medicare Other | Source: Ambulatory Visit | Attending: Vascular Surgery | Admitting: Vascular Surgery

## 2022-04-10 VITALS — BP 142/74 | HR 57 | Temp 97.6°F | Ht 69.0 in | Wt 209.0 lb

## 2022-04-10 DIAGNOSIS — N186 End stage renal disease: Secondary | ICD-10-CM

## 2022-04-10 NOTE — Progress Notes (Signed)
[facility] [place] HISTORY AND PHYSICAL   History of Present Illness:  Patient is a 72 y.o. year old female who presents for left brachial artery to cephalic vein fistula which is failed to mature. She is now indicated for fistulogram with possible intervention.   S/P Balloon assisted dilation.    She is here today for follow up.  She is here for duplex surveillance to check for maturity.  Prior to the fistulagram 03/11/22 there was a tight stenosis in the mid segment and in the cephalic arch.  She denise loss of motor, sensation or pain out of the ordinary.        Past Medical History:  Diagnosis Date   ALLERGIC RHINITIS    Anxiety    Arthritis    CHF (congestive heart failure) (HCC)    CKD (chronic kidney disease)    Dialysis T/Th/Sa   Coronary artery disease    05/12/21 - cath/ stent placement   COVID 01/21/2021   + HOME TEST  HAD COUGH CONGESTION AND MALAISE, OCC CONGESTION NOW   COVID 2022   mild   Heart murmur    at birth   Hyperlipidemia    Hypertension    Hypothyroid    Iron deficiency anemia    Obesity    OSA on CPAP    no longer on a cpap   Refusal of blood transfusions as patient is Jehovah's Witness    Shingles 11/2020   LEFT BACK   Type 2 dm with insulin use    checks abg qday runs 125   Wears glasses     Past Surgical History:  Procedure Laterality Date   A/V FISTULAGRAM Left 03/11/2022   Procedure: A/V Fistulagram;  Surgeon: Waynetta Sandy, MD;  Location: [facility];  Service: Cardiovascular;  Laterality: Left;   AV FISTULA PLACEMENT Left 01/18/2022   Procedure: LEFT ARM ARTERIOVENOUS (AV) FISTULA;  Surgeon: Waynetta Sandy, MD;  Location: [facility];  Service: Vascular;  Laterality: Left;   BREAST BIOPSY     left breast per pt; benign 20+ yrs ago   [place] PROPOFOL N/A 10/24/2020   Procedure: COLONOSCOPY WITH PROPOFOL;  Surgeon: Ronnette Juniper, MD;  [facility]: [facility];   Service: Gastroenterology;  Laterality: N/A;   CORONARY STENT INTERVENTION N/A 05/12/2021   Procedure: CORONARY STENT INTERVENTION;  Surgeon: Early Osmond, MD;  Location: [facility];  Service: Cardiovascular;  Laterality: N/A;   CORONARY/GRAFT ACUTE MI REVASCULARIZATION N/A 05/12/2021   Procedure: Coronary/Graft Acute MI Revascularization;  Surgeon: Early Osmond, MD;  Location: [facility];  Service: Cardiovascular;  Laterality: N/A;   IR FLUORO GUIDE CV LINE RIGHT  10/18/2021   IR US GUIDE VASC ACCESS RIGHT  10/18/2021   KNEE SURGERY Bilateral    x 2   LEFT HEART CATH AND CORONARY ANGIOGRAPHY N/A 05/12/2021   Procedure: LEFT HEART CATH AND CORONARY ANGIOGRAPHY;  Surgeon: Early Osmond, MD;  Location: [facility];  Service: Cardiovascular;  Laterality: N/A;   LUMBAR DISC SURGERY     x 2   PERIPHERAL VASCULAR BALLOON ANGIOPLASTY  03/11/2022   Procedure: PERIPHERAL VASCULAR BALLOON ANGIOPLASTY;  Surgeon: Waynetta Sandy, MD;  Location: [facility];  Service: Cardiovascular;;   POLYPECTOMY  10/24/2020   Procedure: POLYPECTOMY;  Surgeon: Ronnette Juniper, MD;  [facility];  Service: Gastroenterology;;   REDUCTION MAMMAPLASTY Bilateral 1985   THROMBECTOMY  BRACHIAL ARTERY  01/18/2022   Procedure: THROMBECTOMY TO LEFT CEPHALIC VEIN;  Surgeon: Waynetta Sandy, MD;  Location: [facility];  Service: Vascular;;   TOE SURGERY     yrs ago per pt on 02-02-2021   TOTAL ABDOMINAL HYSTERECTOMY  1997   partial hysterectomy- still has ovaries   TOTAL KNEE ARTHROPLASTY Left 07/01/2019   Procedure: TOTAL KNEE ARTHROPLASTY;  Surgeon: Paralee Cancel, MD;  [facility];  Service: Orthopedics;  Laterality: Left;  70 min NO BLOOD PRODUCTS!!   TREATMENT FISTULA ANAL     yrs ago per pt 0n 02-02-2021   TRIGGER FINGER RELEASE Left 02/08/2021   Procedure: Left Ring trigger finger release, left ring finger volar ganglion cyst excision;  Surgeon: Orene Desanctis, MD;   Location: [facility];  Service: Orthopedics;  Laterality: Left;  with local anesthesia     Social History Social History   Tobacco Use   Smoking status: Never   Smokeless tobacco: Never  Vaping Use   Vaping Use: Never used  Substance Use Topics   Alcohol use: Not Currently   Drug use: No    Family History Family History  Problem Relation Age of Onset   Prostate cancer Father    Congestive Heart Failure Mother    Diabetes Sister    Other Sister        septis   Breast cancer Neg Hx     Allergies  Allergies  Allergen Reactions   Other     No blood products   Sulfa Antibiotics Rash     Current Outpatient Medications  Medication Sig Dispense Refill   acetaminophen (TYLENOL) 650 MG CR tablet Take 650 mg by mouth every 8 (eight) hours as needed for pain.     albuterol (VENTOLIN HFA) 108 (90 Base) MCG/ACT inhaler Inhale 2 puffs into the lungs every 6 (six) hours as needed for wheezing or shortness of breath.     amLODipine (NORVASC) 10 MG tablet Take 10 mg by mouth 2 (two) times daily.     aspirin EC 81 MG tablet Take 81 mg by mouth at bedtime. Swallow whole.     atorvastatin (LIPITOR) 80 MG tablet TAKE 1 TABLET BY MOUTH EVERYDAY AT BEDTIME 90 tablet 1   B Complex-C (B-COMPLEX WITH VITAMIN C) tablet Take 1 tablet by mouth daily in the afternoon.     B Complex-C-Zn-Folic Acid (DIALYVITE/ZINC) TABS Take 1 tablet by mouth every evening.     carvedilol (COREG) 25 MG tablet Take 25 mg by mouth 2 (two) times daily with a meal.     clopidogrel (PLAVIX) 75 MG tablet TAKE 1 TABLET BY MOUTH EVERY DAY 90 tablet 1   fluticasone (FLONASE) 50 MCG/ACT nasal spray Place 1 spray into both nostrils daily as needed for allergies.     hydrALAZINE (APRESOLINE) 50 MG tablet Take 50 mg by mouth 2 (two) times daily.     levocetirizine (XYZAL) 5 MG tablet Take 5 mg by mouth in the morning.     levothyroxine (SYNTHROID) 112 MCG tablet Take 224 mcg by mouth daily at 2 am.      LORazepam (ATIVAN) 1 MG tablet Take 1 mg by mouth daily as needed for anxiety.     Multiple Vitamins-Minerals (PRESERVISION AREDS 2 PO) Take 1 tablet by mouth in the morning.     nitroGLYCERIN (NITROSTAT) 0.4 MG SL tablet Place 1 tablet (0.4 mg total) under the tongue every 5 (five) minutes x 3 doses as needed for chest pain.  25 tablet 2   Omega-3 Fatty Acids (FISH OIL) 1000 MG CAPS Take 1,000 mg by mouth at bedtime.     Polyethyl Glycol-Propyl Glycol (SYSTANE) 0.4-0.3 % SOLN Place 1 drop into both eyes in the morning, at noon, in the evening, and at bedtime.     sevelamer carbonate (RENVELA) 800 MG tablet Take 1,600 mg by mouth 3 (three) times daily.     traZODone (DESYREL) 100 MG tablet Take 100 mg by mouth at bedtime.     allopurinol (ZYLOPRIM) 100 MG tablet Take 1 tablet (100 mg total) by mouth daily. (Patient taking differently: Take 100 mg by mouth every evening.) 30 tablet 0   Current Facility-Administered Medications  Medication Dose Route Frequency Provider Last Rate Last Admin   0.9 %  sodium chloride infusion  250 mL Intravenous PRN Waynetta Sandy, MD       sodium chloride flush (NS) 0.9 % injection 3 mL  3 mL Intravenous Q12H Waynetta Sandy, MD        ROS:   General:  No weight loss, Fever, chills  HEENT: No recent headaches, no nasal bleeding, no visual changes, no sore throat  Neurologic: No dizziness, blackouts, seizures. No recent symptoms of stroke or mini- stroke. No recent episodes of slurred speech, or temporary blindness.  Cardiac: No recent episodes of chest pain/pressure, no shortness of breath at rest.  No shortness of breath with exertion.  Denies history of atrial fibrillation or irregular heartbeat  Vascular: No history of rest pain in feet.  No history of claudication.  No history of non-healing ulcer, No history of DVT   Pulmonary: No home oxygen, no productive cough, no hemoptysis,  No asthma or wheezing  Musculoskeletal:  '[ ]'$   Arthritis, '[ ]'$  Low back pain,  '[ ]'$  Joint pain  Hematologic:No history of hypercoagulable state.  No history of easy bleeding.  No history of anemia  Gastrointestinal: No hematochezia or melena,  No gastroesophageal reflux, no trouble swallowing  Urinary: '[ ]'$  chronic Kidney disease, [x ] on HD - '[ ]'$  MWF or [x ] TTHS, '[ ]'$  Burning with urination, '[ ]'$  Frequent urination, '[ ]'$  Difficulty urinating;   Skin: No rashes  Psychological: No history of anxiety,  No history of depression   Physical Examination  Vitals:   04/10/22 0933  BP: (!) 142/74  Pulse: (!) 57  Temp: 97.6 F (36.4 C)  TempSrc: Temporal  SpO2: 99%  Weight: 209 lb (94.8 kg)  Height: '5\' 9"'$  (1.753 m)    Body mass index is 30.86 kg/m.  General:  Alert and oriented, no acute distress HEENT: Normal Neck: No bruit or JVD Pulmonary: Clear to auscultation bilaterally Cardiac: Regular Rate and Rhythm without murmur Gastrointestinal: Soft, non-tender, non-distended, no mass, no scars Skin: No rash Extremity Pulses:  palpable thrill int fistula distal 1/3 radial,  pulses bilaterally Musculoskeletal: No deformity or edema  Neurologic: Upper and lower extremity motor 5/5 and symmetric  DATA:  Findings:  +--------------------+----------+-----------------+--------+  AVF                PSV (cm/s)Flow Vol (mL/min)Comments  +--------------------+----------+-----------------+--------+  Native artery inflow   153           699                 +--------------------+----------+-----------------+--------+  AVF Anastomosis        336                               +--------------------+----------+-----------------+--------+     +---------------+----------+-------------+----------+--------+  OUTFLOW VEIN   PSV (cm/s)Diameter (cm)Depth (cm)Describe  +---------------+----------+-------------+----------+--------+  Subclavian vein    17                            phasic    +---------------+----------+-------------+----------+--------+  Shoulder         647        0.25        1.34   stenotic  +---------------+----------+-------------+----------+--------+  Prox UA           280        0.49        0.79             +---------------+----------+-------------+----------+--------+  Mid UA            109        0.56        0.56             +---------------+----------+-------------+----------+--------+  Dist UA            57        0.92        0.47             +---------------+----------+-------------+----------+--------+  AC Fossa          116        0.83        0.32             +---------------+----------+-------------+----------+--------+        Summary:  Patent arteriovenous fistula.  Arteriovenous fistula-Stenosis noted in the outflow vein at the axilla.   ASSESSMENT/PLAN:  ESRD s/p cephalic fistula followed by veno plasty of the mid and central stenosis. The fistula has failed to mature more and it is to deep to access.  We will plan  superficialization verse graft placement for permanent access.    She has HD via [facility] TTS, so we will schedule her for MWF.  She denise the use of anticoagulation.  She agrees with this plan.      Roxy Horseman PA-C [facility] [place] Office: [phone]  MD in clinic [place]

## 2022-04-10 NOTE — H&P (View-Only) (Signed)
[facility] [place] HISTORY AND PHYSICAL   History of Present Illness:  Patient is a 72 y.o. year old female who presents for left brachial artery to cephalic vein fistula which is failed to mature. She is now indicated for fistulogram with possible intervention.   S/P Balloon assisted dilation.    She is here today for follow up.  She is here for duplex surveillance to check for maturity.  Prior to the fistulagram 03/11/22 there was a tight stenosis in the mid segment and in the cephalic arch.  She denise loss of motor, sensation or pain out of the ordinary.        Past Medical History:  Diagnosis Date   ALLERGIC RHINITIS    Anxiety    Arthritis    CHF (congestive heart failure) (HCC)    CKD (chronic kidney disease)    Dialysis T/Th/Sa   Coronary artery disease    05/12/21 - cath/ stent placement   COVID 01/21/2021   + HOME TEST  HAD COUGH CONGESTION AND MALAISE, OCC CONGESTION NOW   COVID 2022   mild   Heart murmur    at birth   Hyperlipidemia    Hypertension    Hypothyroid    Iron deficiency anemia    Obesity    OSA on CPAP    no longer on a cpap   Refusal of blood transfusions as patient is Jehovah's Witness    Shingles 11/2020   LEFT BACK   Type 2 dm with insulin use    checks abg qday runs 125   Wears glasses     Past Surgical History:  Procedure Laterality Date   A/V FISTULAGRAM Left 03/11/2022   Procedure: A/V Fistulagram;  Surgeon: Cain, Brandon Christopher, MD;  [facility];  Service: Cardiovascular;  Laterality: Left;   AV FISTULA PLACEMENT Left 01/18/2022   Procedure: LEFT ARM ARTERIOVENOUS (AV) FISTULA;  Surgeon: Cain, Brandon Christopher, MD;  Location: [facility];  Service: Vascular;  Laterality: Left;   BREAST BIOPSY     left breast per pt; benign 20+ yrs ago   BREAST REDUCTION SURGERY  1982   COLONOSCOPY WITH PROPOFOL N/A 10/24/2020   Procedure: COLONOSCOPY WITH PROPOFOL;  Surgeon: Karki, Arya, MD;  [facility];   Service: Gastroenterology;  Laterality: N/A;   CORONARY STENT INTERVENTION N/A 05/12/2021   Procedure: CORONARY STENT INTERVENTION;  Surgeon: Thukkani, Arun K, MD;  [facility];  Service: Cardiovascular;  Laterality: N/A;   CORONARY/GRAFT ACUTE MI REVASCULARIZATION N/A 05/12/2021   Procedure: Coronary/Graft Acute MI Revascularization;  Surgeon: Thukkani, Arun K, MD;  [facility];  Service: Cardiovascular;  Laterality: N/A;   IR FLUORO GUIDE CV LINE RIGHT  10/18/2021   IR US GUIDE VASC ACCESS RIGHT  10/18/2021   KNEE SURGERY Bilateral    x 2   LEFT HEART CATH AND CORONARY ANGIOGRAPHY N/A 05/12/2021   Procedure: LEFT HEART CATH AND CORONARY ANGIOGRAPHY;  Surgeon: Thukkani, Arun K, MD;  [facility];  Service: Cardiovascular;  Laterality: N/A;   LUMBAR DISC SURGERY     x 2   PERIPHERAL VASCULAR BALLOON ANGIOPLASTY  03/11/2022   Procedure: PERIPHERAL VASCULAR BALLOON ANGIOPLASTY;  Surgeon: Cain, Brandon Christopher, MD;  [facility];  Service: Cardiovascular;;   POLYPECTOMY  10/24/2020   Procedure: POLYPECTOMY;  Surgeon: Karki, Arya, MD;  [facility];  Service: Gastroenterology;;   REDUCTION MAMMAPLASTY Bilateral 1985   THROMBECTOMY   BRACHIAL ARTERY  01/18/2022   Procedure: THROMBECTOMY TO LEFT CEPHALIC VEIN;  Surgeon: Cain, Brandon Christopher, MD;  [facility];  Service: Vascular;;   TOE SURGERY     yrs ago per pt on 02-02-2021   TOTAL ABDOMINAL HYSTERECTOMY  1997   partial hysterectomy- still has ovaries   TOTAL KNEE ARTHROPLASTY Left 07/01/2019   Procedure: TOTAL KNEE ARTHROPLASTY;  Surgeon: Olin, Matthew, MD;  [facility];  Service: Orthopedics;  Laterality: Left;  70 min NO BLOOD PRODUCTS!!   TREATMENT FISTULA ANAL     yrs ago per pt 0n 02-02-2021   TRIGGER FINGER RELEASE Left 02/08/2021   Procedure: Left Ring trigger finger release, left ring finger volar ganglion cyst excision;  Surgeon: Spears, James, MD;   Location: [facility];  Service: Orthopedics;  Laterality: Left;  with local anesthesia     Social History Social History   Tobacco Use   Smoking status: Never   Smokeless tobacco: Never  Vaping Use   Vaping Use: Never used  Substance Use Topics   Alcohol use: Not Currently   Drug use: No    Family History Family History  Problem Relation Age of Onset   Prostate cancer Father    Congestive Heart Failure Mother    Diabetes Sister    Other Sister        septis   Breast cancer Neg Hx     Allergies  Allergies  Allergen Reactions   Other     No blood products   Sulfa Antibiotics Rash     Current Outpatient Medications  Medication Sig Dispense Refill   acetaminophen (TYLENOL) 650 MG CR tablet Take 650 mg by mouth every 8 (eight) hours as needed for pain.     albuterol (VENTOLIN HFA) 108 (90 Base) MCG/ACT inhaler Inhale 2 puffs into the lungs every 6 (six) hours as needed for wheezing or shortness of breath.     amLODipine (NORVASC) 10 MG tablet Take 10 mg by mouth 2 (two) times daily.     aspirin EC 81 MG tablet Take 81 mg by mouth at bedtime. Swallow whole.     atorvastatin (LIPITOR) 80 MG tablet TAKE 1 TABLET BY MOUTH EVERYDAY AT BEDTIME 90 tablet 1   B Complex-C (B-COMPLEX WITH VITAMIN C) tablet Take 1 tablet by mouth daily in the afternoon.     B Complex-C-Zn-Folic Acid (DIALYVITE/ZINC) TABS Take 1 tablet by mouth every evening.     carvedilol (COREG) 25 MG tablet Take 25 mg by mouth 2 (two) times daily with a meal.     clopidogrel (PLAVIX) 75 MG tablet TAKE 1 TABLET BY MOUTH EVERY DAY 90 tablet 1   fluticasone (FLONASE) 50 MCG/ACT nasal spray Place 1 spray into both nostrils daily as needed for allergies.     hydrALAZINE (APRESOLINE) 50 MG tablet Take 50 mg by mouth 2 (two) times daily.     levocetirizine (XYZAL) 5 MG tablet Take 5 mg by mouth in the morning.     levothyroxine (SYNTHROID) 112 MCG tablet Take 224 mcg by mouth daily at 2 am.      LORazepam (ATIVAN) 1 MG tablet Take 1 mg by mouth daily as needed for anxiety.     Multiple Vitamins-Minerals (PRESERVISION AREDS 2 PO) Take 1 tablet by mouth in the morning.     nitroGLYCERIN (NITROSTAT) 0.4 MG SL tablet Place 1 tablet (0.4 mg total) under the tongue every 5 (five) minutes x 3 doses as needed for chest pain.   25 tablet 2   Omega-3 Fatty Acids (FISH OIL) 1000 MG CAPS Take 1,000 mg by mouth at bedtime.     Polyethyl Glycol-Propyl Glycol (SYSTANE) 0.4-0.3 % SOLN Place 1 drop into both eyes in the morning, at noon, in the evening, and at bedtime.     sevelamer carbonate (RENVELA) 800 MG tablet Take 1,600 mg by mouth 3 (three) times daily.     traZODone (DESYREL) 100 MG tablet Take 100 mg by mouth at bedtime.     allopurinol (ZYLOPRIM) 100 MG tablet Take 1 tablet (100 mg total) by mouth daily. (Patient taking differently: Take 100 mg by mouth every evening.) 30 tablet 0   Current Facility-Administered Medications  Medication Dose Route Frequency Provider Last Rate Last Admin   0.9 %  sodium chloride infusion  250 mL Intravenous PRN Cain, Brandon Christopher, MD       sodium chloride flush (NS) 0.9 % injection 3 mL  3 mL Intravenous Q12H Cain, Brandon Christopher, MD        ROS:   General:  No weight loss, Fever, chills  HEENT: No recent headaches, no nasal bleeding, no visual changes, no sore throat  Neurologic: No dizziness, blackouts, seizures. No recent symptoms of stroke or mini- stroke. No recent episodes of slurred speech, or temporary blindness.  Cardiac: No recent episodes of chest pain/pressure, no shortness of breath at rest.  No shortness of breath with exertion.  Denies history of atrial fibrillation or irregular heartbeat  Vascular: No history of rest pain in feet.  No history of claudication.  No history of non-healing ulcer, No history of DVT   Pulmonary: No home oxygen, no productive cough, no hemoptysis,  No asthma or wheezing  Musculoskeletal:  [ ]  Arthritis, [ ] Low back pain,  [ ] Joint pain  Hematologic:No history of hypercoagulable state.  No history of easy bleeding.  No history of anemia  Gastrointestinal: No hematochezia or melena,  No gastroesophageal reflux, no trouble swallowing  Urinary: [ ] chronic Kidney disease, [x ] on HD - [ ] MWF or [x ] TTHS, [ ] Burning with urination, [ ] Frequent urination, [ ] Difficulty urinating;   Skin: No rashes  Psychological: No history of anxiety,  No history of depression   Physical Examination  Vitals:   04/10/22 0933  BP: (!) 142/74  Pulse: (!) 57  Temp: 97.6 F (36.4 C)  TempSrc: Temporal  SpO2: 99%  Weight: 209 lb (94.8 kg)  Height: 5' 9" (1.753 m)    Body mass index is 30.86 kg/m.  General:  Alert and oriented, no acute distress HEENT: Normal Neck: No bruit or JVD Pulmonary: Clear to auscultation bilaterally Cardiac: Regular Rate and Rhythm without murmur Gastrointestinal: Soft, non-tender, non-distended, no mass, no scars Skin: No rash Extremity Pulses:  palpable thrill int fistula distal 1/3 radial,  pulses bilaterally Musculoskeletal: No deformity or edema  Neurologic: Upper and lower extremity motor 5/5 and symmetric  DATA:  Findings:  +--------------------+----------+-----------------+--------+  AVF                PSV (cm/s)Flow Vol (mL/min)Comments  +--------------------+----------+-----------------+--------+  Native artery inflow   153           699                 +--------------------+----------+-----------------+--------+  AVF Anastomosis        336                               +--------------------+----------+-----------------+--------+     +---------------+----------+-------------+----------+--------+    OUTFLOW VEIN   PSV (cm/s)Diameter (cm)Depth (cm)Describe  +---------------+----------+-------------+----------+--------+  Subclavian vein    17                            phasic    +---------------+----------+-------------+----------+--------+  Shoulder         647        0.25        1.34   stenotic  +---------------+----------+-------------+----------+--------+  Prox UA           280        0.49        0.79             +---------------+----------+-------------+----------+--------+  Mid UA            109        0.56        0.56             +---------------+----------+-------------+----------+--------+  Dist UA            57        0.92        0.47             +---------------+----------+-------------+----------+--------+  AC Fossa          116        0.83        0.32             +---------------+----------+-------------+----------+--------+        Summary:  Patent arteriovenous fistula.  Arteriovenous fistula-Stenosis noted in the outflow vein at the axilla.   ASSESSMENT/PLAN:  ESRD s/p cephalic fistula followed by veno plasty of the mid and central stenosis. The fistula has failed to mature more and it is to deep to access.  We will plan  superficialization verse graft placement for permanent access.    She has HD via [facility] TTS, so we will schedule her for MWF.  She denise the use of anticoagulation.  She agrees with this plan.      Tinna Kolker Maureen Benedicta Sultan PA-C [facility] [place] Office: [phone]  MD in clinic [place] 

## 2022-04-11 ENCOUNTER — Other Ambulatory Visit: Payer: Self-pay

## 2022-04-11 DIAGNOSIS — N186 End stage renal disease: Secondary | ICD-10-CM

## 2022-04-17 ENCOUNTER — Ambulatory Visit: Payer: Medicare Other | Attending: Internal Medicine | Admitting: Internal Medicine

## 2022-04-17 ENCOUNTER — Encounter: Payer: Self-pay | Admitting: Internal Medicine

## 2022-04-17 VITALS — BP 135/75 | HR 67 | Ht 69.0 in | Wt 209.2 lb

## 2022-04-17 DIAGNOSIS — N186 End stage renal disease: Secondary | ICD-10-CM

## 2022-04-17 DIAGNOSIS — E785 Hyperlipidemia, unspecified: Secondary | ICD-10-CM | POA: Diagnosis not present

## 2022-04-17 DIAGNOSIS — Z6837 Body mass index (BMI) 37.0-37.9, adult: Secondary | ICD-10-CM

## 2022-04-17 DIAGNOSIS — I10 Essential (primary) hypertension: Secondary | ICD-10-CM | POA: Diagnosis not present

## 2022-04-17 DIAGNOSIS — I700 Atherosclerosis of aorta: Secondary | ICD-10-CM

## 2022-04-17 DIAGNOSIS — I2111 ST elevation (STEMI) myocardial infarction involving right coronary artery: Secondary | ICD-10-CM

## 2022-04-17 DIAGNOSIS — I5032 Chronic diastolic (congestive) heart failure: Secondary | ICD-10-CM

## 2022-04-17 MED ORDER — CLOPIDOGREL BISULFATE 75 MG PO TABS: 75.0000 mg | ORAL_TABLET | Freq: Every day | ORAL | 3 refills | Status: DC

## 2022-04-17 MED ORDER — ASPIRIN 81 MG PO TBEC: 81.0000 mg | DELAYED_RELEASE_TABLET | Freq: Every day | ORAL | Status: AC

## 2022-04-17 NOTE — Patient Instructions (Signed)
Medication Instructions:  STOP Aspirin on MAY 1 Continue Plavix '75mg'$  once daily  *If you need a refill on your cardiac medications before your next appointment, please call your pharmacy*   Lab Work: Lipids, LFT, and Lp(a) TODAY If you have labs (blood work) drawn today and your tests are completely normal, you will receive your results only by: [place] (if you have MyChart) OR A paper copy in the mail If you have any lab test that is abnormal or we need to change your treatment, we will call you to review the results.   Follow-Up: At [facility], you and your health needs are our priority.  As part of our continuing mission to provide you with exceptional heart care, we have created designated Provider Care Teams.  These Care Teams include your primary Cardiologist (physician) and Advanced Practice Providers (APPs -  Physician Assistants and Nurse Practitioners) who all work together to provide you with the care you need, when you need it.  We recommend signing up for the patient portal called "MyChart".  Sign up information is provided on this After Visit Summary.  MyChart is used to connect with patients for Virtual Visits (Telemedicine).  Patients are able to view lab/test results, encounter notes, upcoming appointments, etc.  Non-urgent messages can be sent to your provider as well.   To learn more about what you can do with MyChart, go to [email or web address].    Your next appointment:   6 month(s)  Provider:   Nicholes Rough, PA-C, Melina Copa, PA-C, Ambrose Pancoast, NP, Cecilie Kicks, NP, Ermalinda Barrios, PA-C, Christen Bame, NP, or Richardson Dopp, PA-C

## 2022-04-18 LAB — LIPID PANEL
Chol/HDL Ratio: 1.8 ratio (ref 0.0–4.4)
Cholesterol, Total: 117 mg/dL (ref 100–199)
HDL: 66 mg/dL (ref >39)
LDL Chol Calc (NIH): 39 mg/dL (ref 0–99)
Triglycerides: 49 mg/dL (ref 0–149)
VLDL Cholesterol Cal: 12 mg/dL (ref 5–40)

## 2022-04-18 LAB — LIPOPROTEIN A (LPA): Lipoprotein (a): 38.5 nmol/L (ref <75.0)

## 2022-04-18 LAB — HEPATIC FUNCTION PANEL
ALT: 16 IU/L (ref 0–32)
AST: 20 IU/L (ref 0–40)
Albumin: 4.4 g/dL (ref 3.8–4.8)
Alkaline Phosphatase: 199 IU/L — ABNORMAL HIGH (ref 44–121)
Bilirubin Total: 0.3 mg/dL (ref 0.0–1.2)
Bilirubin, Direct: 0.13 mg/dL (ref 0.00–0.40)
Total Protein: 6.7 g/dL (ref 6.0–8.5)

## 2022-04-22 ENCOUNTER — Ambulatory Visit: Payer: Medicare Other | Admitting: Internal Medicine

## 2022-04-25 ENCOUNTER — Encounter (HOSPITAL_COMMUNITY): Payer: Self-pay | Admitting: Vascular Surgery

## 2022-04-25 ENCOUNTER — Other Ambulatory Visit: Payer: Self-pay

## 2022-04-25 NOTE — Progress Notes (Signed)
Ms Chisman denies chest pain or shortness of breath.  Patient denies having any s/s of Covid in her household, also denies any known exposure to Covid. Ms. Zein denies any s/s of upper or lower respiratory infections in the  past 2  months.  Ms Usry has Type II diabetes, patient checks CBG most days, it runs 70- 80 fasting and 200's after eating.  Ms Wiehe is diet controlled. I instructed patient to check CBG after awaking and every 2 hours until arrival  to the hospital.  I Instructed patient if CBG is less than 70 treat take 4 Glucose Tablets or 1 tube of Glucose Gel or 1/2 cup of a clear juice. Recheck CBG in 15 minutes if CBG is not over 70 call, pre- op desk at [phone] for further instructions.

## 2022-04-26 ENCOUNTER — Ambulatory Visit (HOSPITAL_COMMUNITY)
Admission: RE | Admit: 2022-04-26 | Discharge: 2022-04-26 | Disposition: A | Discharge status: Home / Self Care | Payer: Medicare Other | Attending: Vascular Surgery | Admitting: Vascular Surgery

## 2022-04-26 ENCOUNTER — Ambulatory Visit (HOSPITAL_COMMUNITY): Payer: Medicare Other

## 2022-04-26 ENCOUNTER — Ambulatory Visit (HOSPITAL_BASED_OUTPATIENT_CLINIC_OR_DEPARTMENT_OTHER): Payer: Medicare Other | Admitting: Certified Registered"

## 2022-04-26 ENCOUNTER — Ambulatory Visit (HOSPITAL_COMMUNITY): Payer: Medicare Other | Admitting: Certified Registered"

## 2022-04-26 ENCOUNTER — Other Ambulatory Visit: Payer: Self-pay

## 2022-04-26 ENCOUNTER — Encounter (HOSPITAL_COMMUNITY)
Admission: RE | Disposition: A | Discharge status: Home / Self Care | Payer: Self-pay | Source: Home / Self Care | Attending: Vascular Surgery

## 2022-04-26 DIAGNOSIS — I059 Rheumatic mitral valve disease, unspecified: Secondary | ICD-10-CM | POA: Insufficient documentation

## 2022-04-26 DIAGNOSIS — I252 Old myocardial infarction: Secondary | ICD-10-CM | POA: Insufficient documentation

## 2022-04-26 DIAGNOSIS — F419 Anxiety disorder, unspecified: Secondary | ICD-10-CM | POA: Insufficient documentation

## 2022-04-26 DIAGNOSIS — N186 End stage renal disease: Secondary | ICD-10-CM | POA: Diagnosis not present

## 2022-04-26 DIAGNOSIS — Z955 Presence of coronary angioplasty implant and graft: Secondary | ICD-10-CM | POA: Insufficient documentation

## 2022-04-26 DIAGNOSIS — T82898A Other specified complication of vascular prosthetic devices, implants and grafts, initial encounter: Secondary | ICD-10-CM

## 2022-04-26 DIAGNOSIS — Z794 Long term (current) use of insulin: Secondary | ICD-10-CM | POA: Insufficient documentation

## 2022-04-26 DIAGNOSIS — Z79899 Other long term (current) drug therapy: Secondary | ICD-10-CM | POA: Diagnosis not present

## 2022-04-26 DIAGNOSIS — Z992 Dependence on renal dialysis: Secondary | ICD-10-CM | POA: Diagnosis not present

## 2022-04-26 DIAGNOSIS — I132 Hypertensive heart and chronic kidney disease with heart failure and with stage 5 chronic kidney disease, or end stage renal disease: Secondary | ICD-10-CM | POA: Diagnosis not present

## 2022-04-26 DIAGNOSIS — G4733 Obstructive sleep apnea (adult) (pediatric): Secondary | ICD-10-CM | POA: Insufficient documentation

## 2022-04-26 DIAGNOSIS — E1122 Type 2 diabetes mellitus with diabetic chronic kidney disease: Secondary | ICD-10-CM | POA: Diagnosis present

## 2022-04-26 DIAGNOSIS — E039 Hypothyroidism, unspecified: Secondary | ICD-10-CM | POA: Insufficient documentation

## 2022-04-26 DIAGNOSIS — M1990 Unspecified osteoarthritis, unspecified site: Secondary | ICD-10-CM | POA: Insufficient documentation

## 2022-04-26 DIAGNOSIS — N185 Chronic kidney disease, stage 5: Secondary | ICD-10-CM

## 2022-04-26 DIAGNOSIS — I509 Heart failure, unspecified: Secondary | ICD-10-CM

## 2022-04-26 DIAGNOSIS — I2510 Atherosclerotic heart disease of native coronary artery without angina pectoris: Secondary | ICD-10-CM

## 2022-04-26 HISTORY — PX: GRAFT APPLICATION: SHX6696

## 2022-04-26 HISTORY — PX: FISTULA SUPERFICIALIZATION: SHX6341

## 2022-04-26 LAB — POCT I-STAT, CHEM 8
BUN: 41 mg/dL — ABNORMAL HIGH (ref 8–23)
Calcium, Ion: 1.15 mmol/L (ref 1.15–1.40)
Chloride: 98 mmol/L (ref 98–111)
Creatinine, Ser: 5.2 mg/dL — ABNORMAL HIGH (ref 0.44–1.00)
Glucose, Bld: 163 mg/dL — ABNORMAL HIGH (ref 70–99)
HCT: 38 % (ref 36.0–46.0)
Hemoglobin: 12.9 g/dL (ref 12.0–15.0)
Potassium: 4.2 mmol/L (ref 3.5–5.1)
Sodium: 139 mmol/L (ref 135–145)
TCO2: 30 mmol/L (ref 22–32)

## 2022-04-26 LAB — GLUCOSE, CAPILLARY
Glucose-Capillary: 161 mg/dL — ABNORMAL HIGH (ref 70–99)
Glucose-Capillary: 172 mg/dL — ABNORMAL HIGH (ref 70–99)
Glucose-Capillary: 172 mg/dL — ABNORMAL HIGH (ref 70–99)

## 2022-04-26 LAB — NO BLOOD PRODUCTS

## 2022-04-26 SURGERY — FISTULA SUPERFICIALIZATION
Anesthesia: Monitor Anesthesia Care | Site: Arm Upper | Laterality: Left

## 2022-04-26 MED ORDER — LIDOCAINE-EPINEPHRINE (PF) 1 %-1:200000 IJ SOLN
INTRAMUSCULAR | Status: DC | PRN
  Administered 2022-04-26: 30 mL via INTRADERMAL

## 2022-04-26 MED ORDER — 0.9 % SODIUM CHLORIDE (POUR BTL) OPTIME
TOPICAL | Status: DC | PRN
  Administered 2022-04-26: 1000 mL

## 2022-04-26 MED ORDER — PHENYLEPHRINE HCL-NACL 20-0.9 MG/250ML-% IV SOLN
INTRAVENOUS | Status: DC | PRN
  Administered 2022-04-26: 30 ug/min via INTRAVENOUS

## 2022-04-26 MED ORDER — HEMOSTATIC AGENTS (NO CHARGE) OPTIME
TOPICAL | Status: DC | PRN
  Administered 2022-04-26: 1 via TOPICAL

## 2022-04-26 MED ORDER — LIDOCAINE-EPINEPHRINE (PF) 1 %-1:200000 IJ SOLN
INTRAMUSCULAR | Status: AC
  Filled 2022-04-26: qty 30

## 2022-04-26 MED ORDER — HEPARIN 6000 UNIT IRRIGATION SOLUTION
Status: DC | PRN
  Administered 2022-04-26: 1

## 2022-04-26 MED ORDER — LACTATED RINGERS IV SOLN: INTRAVENOUS | Status: DC

## 2022-04-26 MED ORDER — FENTANYL CITRATE (PF) 250 MCG/5ML IJ SOLN
INTRAMUSCULAR | Status: AC
  Filled 2022-04-26: qty 5

## 2022-04-26 MED ORDER — PROPOFOL 10 MG/ML IV BOLUS
INTRAVENOUS | Status: DC | PRN
  Administered 2022-04-26 (×2): 30 mg via INTRAVENOUS

## 2022-04-26 MED ORDER — CEFAZOLIN SODIUM-DEXTROSE 2-4 GM/100ML-% IV SOLN
2.0000 g | INTRAVENOUS | Status: AC
  Administered 2022-04-26: 2 g via INTRAVENOUS

## 2022-04-26 MED ORDER — FENTANYL CITRATE (PF) 250 MCG/5ML IJ SOLN
INTRAMUSCULAR | Status: DC | PRN
  Administered 2022-04-26 (×2): 50 ug via INTRAVENOUS

## 2022-04-26 MED ORDER — PROTAMINE SULFATE 10 MG/ML IV SOLN
INTRAVENOUS | Status: AC
  Filled 2022-04-26: qty 5

## 2022-04-26 MED ORDER — CHLORHEXIDINE GLUCONATE 4 % EX LIQD: 60.0000 mL | Freq: Once | CUTANEOUS | Status: DC

## 2022-04-26 MED ORDER — CHLORHEXIDINE GLUCONATE 0.12 % MT SOLN: 15.0000 mL | Freq: Once | OROMUCOSAL | Status: AC

## 2022-04-26 MED ORDER — SODIUM CHLORIDE 0.9 % IV SOLN: INTRAVENOUS | Status: DC

## 2022-04-26 MED ORDER — HEPARIN SODIUM (PORCINE) 1000 UNIT/ML IJ SOLN
2000.0000 [IU] | Freq: Once | INTRAMUSCULAR | Status: AC
  Administered 2022-04-26: 1600 [IU] via INTRAVENOUS
  Filled 2022-04-26: qty 2

## 2022-04-26 MED ORDER — PROTAMINE SULFATE 10 MG/ML IV SOLN
INTRAVENOUS | Status: DC | PRN
  Administered 2022-04-26: 25 mg via INTRAVENOUS

## 2022-04-26 MED ORDER — CHLORHEXIDINE GLUCONATE 0.12 % MT SOLN
OROMUCOSAL | Status: AC
  Administered 2022-04-26: 15 mL via OROMUCOSAL
  Filled 2022-04-26: qty 15

## 2022-04-26 MED ORDER — ACETAMINOPHEN 10 MG/ML IV SOLN: 1000.0000 mg | Freq: Once | INTRAVENOUS | Status: DC | PRN

## 2022-04-26 MED ORDER — CEFAZOLIN SODIUM-DEXTROSE 2-4 GM/100ML-% IV SOLN
INTRAVENOUS | Status: AC
  Filled 2022-04-26: qty 100

## 2022-04-26 MED ORDER — ONDANSETRON HCL 4 MG/2ML IJ SOLN
INTRAMUSCULAR | Status: DC | PRN
  Administered 2022-04-26: 4 mg via INTRAVENOUS

## 2022-04-26 MED ORDER — ORAL CARE MOUTH RINSE: 15.0000 mL | Freq: Once | OROMUCOSAL | Status: AC

## 2022-04-26 MED ORDER — FENTANYL CITRATE (PF) 100 MCG/2ML IJ SOLN: 25.0000 ug | INTRAMUSCULAR | Status: DC | PRN

## 2022-04-26 MED ORDER — PHENYLEPHRINE 80 MCG/ML (10ML) SYRINGE FOR IV PUSH (FOR BLOOD PRESSURE SUPPORT)
PREFILLED_SYRINGE | INTRAVENOUS | Status: AC
  Filled 2022-04-26: qty 10

## 2022-04-26 MED ORDER — EPHEDRINE SULFATE-NACL 50-0.9 MG/10ML-% IV SOSY
PREFILLED_SYRINGE | INTRAVENOUS | Status: DC | PRN
  Administered 2022-04-26 (×2): 5 mg via INTRAVENOUS
  Administered 2022-04-26: 10 mg via INTRAVENOUS

## 2022-04-26 MED ORDER — ONDANSETRON HCL 4 MG/2ML IJ SOLN
INTRAMUSCULAR | Status: AC
  Filled 2022-04-26: qty 2

## 2022-04-26 MED ORDER — ACETAMINOPHEN 160 MG/5ML PO SOLN: 1000.0000 mg | Freq: Once | ORAL | Status: DC | PRN

## 2022-04-26 MED ORDER — PROPOFOL 500 MG/50ML IV EMUL
INTRAVENOUS | Status: DC | PRN
  Administered 2022-04-26: 100 ug/kg/min via INTRAVENOUS

## 2022-04-26 MED ORDER — HEPARIN SODIUM (PORCINE) 1000 UNIT/ML IJ SOLN
INTRAMUSCULAR | Status: AC
  Filled 2022-04-26: qty 10

## 2022-04-26 MED ORDER — INSULIN ASPART 100 UNIT/ML IJ SOLN: 0.0000 [IU] | INTRAMUSCULAR | Status: DC | PRN

## 2022-04-26 MED ORDER — OXYCODONE-ACETAMINOPHEN 5-325 MG PO TABS: 1.0000 | ORAL_TABLET | Freq: Four times a day (QID) | ORAL | 0 refills | Status: DC | PRN

## 2022-04-26 MED ORDER — HEPARIN SODIUM (PORCINE) 1000 UNIT/ML IJ SOLN
INTRAMUSCULAR | Status: DC | PRN
  Administered 2022-04-26: 3000 [IU] via INTRAVENOUS

## 2022-04-26 MED ORDER — PAPAVERINE HCL 30 MG/ML IJ SOLN
INTRAMUSCULAR | Status: AC
  Filled 2022-04-26: qty 2

## 2022-04-26 MED ORDER — HEPARIN 6000 UNIT IRRIGATION SOLUTION
Status: AC
  Filled 2022-04-26: qty 500

## 2022-04-26 MED ORDER — ACETAMINOPHEN 500 MG PO TABS: 1000.0000 mg | ORAL_TABLET | Freq: Once | ORAL | Status: DC | PRN

## 2022-04-26 SURGICAL SUPPLY — 53 items
ADH SKN CLS APL DERMABOND .7 (GAUZE/BANDAGES/DRESSINGS) ×2
ARMBAND PINK RESTRICT EXTREMIT (MISCELLANEOUS) ×2 IMPLANT
BAG COUNTER SPONGE SURGICOUNT (BAG) ×2 IMPLANT
BAG DECANTER FOR FLEXI CONT (MISCELLANEOUS) IMPLANT
BAG SPNG CNTER NS LX DISP (BAG) ×2
BALLN MUSTANG 7X150X135 (BALLOONS) ×2
BALLOON MUSTANG 7X150X135 (BALLOONS) IMPLANT
BNDG CMPR 5X62 HK CLSR LF (GAUZE/BANDAGES/DRESSINGS) ×2
BNDG ELASTIC 4X5.8 VLCR STR LF (GAUZE/BANDAGES/DRESSINGS) IMPLANT
BNDG ELASTIC 6INX 5YD STR LF (GAUZE/BANDAGES/DRESSINGS) IMPLANT
CANISTER SUCT 3000ML PPV (MISCELLANEOUS) ×2 IMPLANT
CLIP LIGATING EXTRA MED SLVR (CLIP) ×2 IMPLANT
CLIP LIGATING EXTRA SM BLUE (MISCELLANEOUS) ×2 IMPLANT
COVER PROBE W GEL 5X96 (DRAPES) ×2 IMPLANT
DERMABOND ADVANCED .7 DNX12 (GAUZE/BANDAGES/DRESSINGS) ×2 IMPLANT
ELECT REM PT RETURN 9FT ADLT (ELECTROSURGICAL) ×2
ELECTRODE REM PT RTRN 9FT ADLT (ELECTROSURGICAL) ×2 IMPLANT
GLIDEWIRE ADV .035X180CM (WIRE) IMPLANT
GLOVE BIO SURGEON STRL SZ 6.5 (GLOVE) IMPLANT
GLOVE BIO SURGEON STRL SZ7.5 (GLOVE) ×2 IMPLANT
GLOVE BIOGEL PI IND STRL 7.0 (GLOVE) IMPLANT
GLOVE SS BIOGEL STRL SZ 6.5 (GLOVE) IMPLANT
GOWN STRL REUS W/ TWL LRG LVL3 (GOWN DISPOSABLE) ×4 IMPLANT
GOWN STRL REUS W/ TWL XL LVL3 (GOWN DISPOSABLE) ×2 IMPLANT
GOWN STRL REUS W/TWL LRG LVL3 (GOWN DISPOSABLE) ×4
GOWN STRL REUS W/TWL XL LVL3 (GOWN DISPOSABLE) ×2
GRAFT GORETEX STRT 4-7X45 (Vascular Products) IMPLANT
INSERT FOGARTY SM (MISCELLANEOUS) IMPLANT
KIT BASIN OR (CUSTOM PROCEDURE TRAY) ×2 IMPLANT
KIT ENCORE 26 ADVANTAGE (KITS) IMPLANT
KIT TURNOVER KIT B (KITS) ×2 IMPLANT
NS IRRIG 1000ML POUR BTL (IV SOLUTION) ×2 IMPLANT
PACK CV ACCESS (CUSTOM PROCEDURE TRAY) ×2 IMPLANT
PAD ARMBOARD 7.5X6 YLW CONV (MISCELLANEOUS) ×4 IMPLANT
POWDER SURGICEL 3.0 GRAM (HEMOSTASIS) IMPLANT
SET MICROPUNCTURE 5F STIFF (MISCELLANEOUS) IMPLANT
SHEATH PINNACLE 7F 10CM (SHEATH) IMPLANT
SLING ARM FOAM STRAP LRG (SOFTGOODS) IMPLANT
SLING ARM IMMOBILIZER LRG (SOFTGOODS) IMPLANT
STENT VIABAHN 5X120X7FLXB (Permanent Stent) IMPLANT
STENT VIABAHN 7X50X120 (Permanent Stent) ×2 IMPLANT
STENT VIABAHN 8X50X120 (Permanent Stent) ×4 IMPLANT
STENT VIABAHN5X120X8FR 8X (Permanent Stent) IMPLANT
SUT ETHILON 3 0 PS 1 (SUTURE) IMPLANT
SUT MNCRL AB 4-0 PS2 18 (SUTURE) ×2 IMPLANT
SUT PROLENE 6 0 BV (SUTURE) ×2 IMPLANT
SUT VIC AB 3-0 SH 27 (SUTURE) ×2
SUT VIC AB 3-0 SH 27X BRD (SUTURE) ×2 IMPLANT
SYR 30ML LL (SYRINGE) IMPLANT
TOWEL GREEN STERILE (TOWEL DISPOSABLE) ×2 IMPLANT
UNDERPAD 30X36 HEAVY ABSORB (UNDERPADS AND DIAPERS) ×2 IMPLANT
WATER STERILE IRR 1000ML POUR (IV SOLUTION) ×2 IMPLANT
WIRE STARTER BENTSON 035X150 (WIRE) IMPLANT

## 2022-04-26 NOTE — Anesthesia Preprocedure Evaluation (Signed)
Anesthesia Evaluation  Patient identified by MRN, date of birth, ID band Patient awake    Reviewed: Allergy & Precautions, NPO status , Patient's Chart, lab work & pertinent test results, reviewed documented beta blocker date and time   History of Anesthesia Complications Negative for: history of anesthetic complications  Airway Mallampati: II  TM Distance: >3 FB Neck ROM: Full    Dental  (+) Dental Advisory Given, Teeth Intact,    Pulmonary neg shortness of breath, sleep apnea , neg COPD, neg recent URI Non-compliant cpap   breath sounds clear to auscultation       Cardiovascular hypertension, Pt. on home beta blockers and Pt. on medications + CAD, + Past MI, + Cardiac Stents and +CHF   Rhythm:Regular   1. Left ventricular ejection fraction, by estimation, is 70 to 75%. The  left ventricle has hyperdynamic function. The left ventricle has no  regional wall motion abnormalities. There is severe concentric left  ventricular hypertrophy. Left ventricular  diastolic function could not be evaluated.   2. Moderate calcific mitral valve disease is present. MG 6.3 mmHG @ 69  bpm. There is calcified chordal tissue under the MV which does demonstrate  some mobility. 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 The mitral valve is  degenerative. Trivial mitral valve regurgitation. Moderate mitral  stenosis. The mean mitral valve gradient is 6.3 mmHg with average heart  rate of 69 bpm.   3. Right ventricular systolic function is normal. The right ventricular  size is normal. There is mildly elevated pulmonary artery systolic  pressure. The estimated right ventricular systolic pressure is 0000000 mmHg.   4. The aortic valve is tricuspid. There is moderate calcification of the  aortic valve. There is moderate thickening of the aortic valve. Aortic  valve regurgitation is not visualized. Mild aortic valve stenosis.  Aortic  valve area, by VTI measures 2.04  cm. Aortic valve mean gradient measures 11.0 mmHg. Aortic valve Vmax  measures 2.36 m/s.   5. Left atrial size was severely dilated.   6. Right atrial size was mildly dilated.   7. A small pericardial effusion is present. The pericardial effusion is  circumferential.   8. The inferior vena cava is dilated in size with <50% respiratory  variability, suggesting right atrial pressure of 15 mmHg.     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   Dist RCA lesion is 80% stenosed.   A stent was successfully placed.   A stent was successfully placed.   Post intervention, there is a 0% residual stenosis.   Post intervention, there is a 0% residual stenosis.   LV end diastolic pressure is normal.   1.  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 2.  LVEDP of 11 mmHg.    Neuro/Psych  PSYCHIATRIC DISORDERS Anxiety     negative neurological ROS     GI/Hepatic negative GI ROS, Neg liver ROS,,,  Endo/Other  diabetesHypothyroidism    Renal/GU ESRF and DialysisRenal diseaseLab Results      Component                Value               Date  CREATININE               5.20 (H)            03/11/2022           Lab Results      Component                Value               Date                      K                        4.8                 03/11/2022                Musculoskeletal  (+) Arthritis ,    Abdominal   Peds  Hematology Lab Results      Component                Value               Date                      WBC                      4.9                 10/24/2021                HGB                      12.9                03/11/2022                HCT                      38.0                03/11/2022                MCV                       91.9                10/24/2021                PLT                      84 (L)              10/24/2021              Anesthesia Other Findings   Reproductive/Obstetrics                             Anesthesia Physical Anesthesia Plan  ASA: 3  Anesthesia Plan: MAC   Post-op Pain Management:    Induction:   PONV Risk Score and Plan: Ondansetron and Propofol infusion  Airway Management Planned: Nasal Cannula, Natural Airway and Simple Face Mask  Additional Equipment: None  Intra-op Plan:   Post-operative Plan:   Informed Consent: I have reviewed the patients History and Physical, chart,  labs and discussed the procedure including the risks, benefits and alternatives for the proposed anesthesia with the patient or authorized representative who has indicated his/her understanding and acceptance.     Dental advisory given  Plan Discussed with: CRNA  Anesthesia Plan Comments:        Anesthesia Quick Evaluation

## 2022-04-26 NOTE — Op Note (Signed)
Patient name: Tiffany [name] MRN: [id number] DOB: [birth year] Sex: female  04/26/2022 Pre-operative Diagnosis: End-stage renal disease Post-operative diagnosis:  Same Surgeon:  Erlene Quan C. Donzetta Matters, MD Assistant: Paulo Fruit, PA Procedure Performed: 1.  Revision of left arm cephalic vein AV fistula with transposition 2.  Stent of left cephalic vein with 7 x 50 mm and 8 x 50 mm x 2 Viabahn 3.  Revision of left arm AV fistula with cephalic vein to axillary vein interposition 7 mm PTFE graft   Indications: 72 year old female with history of end-stage renal disease on dialysis via catheter with a history of previous cephalic vein AV fistula that underwent endovascular intervention and now is indicated for superficialization with possible transposition and possible conversion to graft.  An experienced assistant was necessary to facilitate exposure of the entire fistula as well as tunneling and to perform the anastomoses of the fistula to the fistula and ultimately of the interposition graft.  Findings: The fistula near the antecubital measured approximately 8 mm but towards the axilla measures approximately 5 mm but did appear somewhat sclerotic.  I initially transposed this fistula but it had pulsatility and ultimately there was a tear towards the axilla and I placed a 7 French sheath and 3 Viabahn's but the Viabahn store the fistula given the thin nature of it.  Ultimately converted this to an interposition graft from the fistula near the antecubitum up to the axillary vein there was a very strong thrill in the graft with strong Doppler flow in the axillary vein and palpable radial artery pulse confirmed with Doppler at completion.   Procedure:  The patient was identified in the holding area and taken to the operating room where she was placed upon the operating where MAC anesthesia was induced.  She was sterilely prepped and draped in the left upper extremity in the usual fashion, antibiotics were  administered a timeout was called.  We began by anesthetizing the upper extremity the expected tunnel position as well as the expected incisional sites with 1% lidocaine with epinephrine.  I then made 2 vertical incisions overlying the palpable fistula and dissected out the fistula throughout the upper arm dividing branches between ties.  When it was fully mobilized I marked for orientation clamped near the antecubitum and transected it and tunneled it laterally and flushed with heparinized saline and the fistula was reanastomosed in an end-to-end fashion.  At completion the fistula was somewhat pulsatile and was very thin near the axilla and mobilizing there was a tear which I attempted to repair.  Ultimately I could not repair this I clamped the fistula near the antecubitum and 3000 units of heparin was administered.  I then cannulated the fistula with a micropuncture needle followed by wire and a sheath placed a Glidewire advantage with direct visualization across the cephalic vein tear and then primarily stented distally with a 7 x 50 mm Viabahn extender with two 8 x 50 mm Viabahn's but the fistula continued to tear and ultimately elected this would not suitable fistula and I tied it off distally and transected it near the anastomosis and discarded the fistula and the stents.  I then dissected through the deep fascia near the axilla to the axillary vein identified a very large axillary vein marked for orientation tied off peripherally and transected it and then flushed with heparinized saline centrally and clamped.  I tunneled a 4-7 mm graft in between the 2 incisions and trimmed to size where it was essentially 7 mm  throughout.  I sewed it into and to the previous fistula allowed flushing through this this was done with 6-0 Prolene suture.  I then spatulated the axillary vein and sewed that end to end again with 6-0 Prolene suture.  At completion there was a very strong thrill in the graft and strong signal in  the vein on both ends and a palpable radial artery pulse confirmed with Doppler.  Satisfied with this we irrigated the wound.  We obtain hemostasis and administer 25 mg of protamine.  We then closed in layers with Vicryl and Monocryl.  The patient was awakened from anesthesia having tolerated the procedure well without immediate complication.  All counts were correct at completion.  EBL: 500 cc   Maddyx Vallie C. Donzetta Matters, MD [facility] Office: [phone] Pager: [phone]

## 2022-04-26 NOTE — Discharge Instructions (Signed)
[facility]  Discharge Instructions  AV Fistula or Graft Surgery for Dialysis Access  Please refer to the following instructions for your post-procedure care. Your surgeon or physician assistant will discuss any changes with you.  Activity  You may drive the day following your surgery, if you are comfortable and no longer taking prescription pain medication. Resume full activity as the soreness in your incision resolves.  Bathing/Showering  You may shower after you go home. Keep your incision dry for 48 hours. Do not soak in a bathtub, hot tub, or swim until the incision heals completely. You may not shower if you have a hemodialysis catheter.  Incision Care  Clean your incision with mild soap and water after 48 hours. Pat the area dry with a clean towel. You do not need a bandage unless otherwise instructed. Do not apply any ointments or creams to your incision. You may have skin glue on your incision. Do not peel it off. It will come off on its own in about one week. Your arm may swell a bit after surgery. To reduce swelling use pillows to elevate your arm so it is above your heart. Your doctor will tell you if you need to lightly wrap your arm with an ACE bandage.  Diet  Resume your normal diet. There are not special food restrictions following this procedure. In order to heal from your surgery, it is CRITICAL to get adequate nutrition. Your body requires vitamins, minerals, and protein. Vegetables are the best source of vitamins and minerals. Vegetables also provide the perfect balance of protein. Processed food has little nutritional value, so try to avoid this.  Medications  Resume taking all of your medications. If your incision is causing pain, you may take over-the counter pain relievers such as acetaminophen (Tylenol). If you were prescribed a stronger pain medication, please be aware these medications can cause nausea and constipation. Prevent  nausea by taking the medication with a snack or meal. Avoid constipation by drinking plenty of fluids and eating foods with high amount of fiber, such as fruits, vegetables, and grains.  Do not take Tylenol if you are taking prescription pain medications.  Follow up Your surgeon may want to see you in the office following your access surgery. If so, this will be arranged at the time of your surgery.  Please call us immediately for any of the following conditions:  Increased pain, redness, drainage (pus) from your incision site Fever of 101 degrees or higher Severe or worsening pain at your incision site Hand pain or numbness.  Reduce your risk of vascular disease:  Stop smoking. If you would like help, call [facility] at 1-800-QUIT-NOW 617-096-7663) or [place] [facility] [place] your cholesterol Maintain a desired weight Control your diabetes Keep your blood pressure down  Dialysis  It will take several weeks to several months for your new dialysis access to be ready for use. Your surgeon will determine when it is okay to use it. Your nephrologist will continue to direct your dialysis. You can continue to use your Permcath until your new access is ready for use.   04/26/2022 Tiffany [name] [id number] August 08, 1950  Surgeon(s): Waynetta Sandy, MD  Procedure(s): LEFT UPPER ARM BRACHIAL CEPHALIC FISTULA LIGATION W/ ATTEMPTED CEPHALIC VEIN STENTING LEFT UPPER ARM GORE-TEX GRAFT APPLICATION   May stick graft immediately   May stick graft on designated area only:   X Do not stick left AV graft for 4 weeks  If you have any questions, please call the office at [phone].

## 2022-04-26 NOTE — Interval H&P Note (Signed)
History and Physical Interval Note:  04/26/2022 7:26 AM  Tiffany [name]  has presented today for surgery, with the diagnosis of End stage renal disease.  The various methods of treatment have been discussed with the patient and family. After consideration of risks, benefits and other options for treatment, the patient has consented to  Procedure(s): LEFT UPPER ARM CEPHALIC FISTULA SUPERFICIALIZATION VERSUS GRAFT (Left) as a surgical intervention.  The patient's history has been reviewed, patient examined, no change in status, stable for surgery.  I have reviewed the patient's chart and labs.  Questions were answered to the patient's satisfaction.     [name]

## 2022-04-26 NOTE — Transfer of Care (Signed)
Immediate Anesthesia Transfer of Care Note  Patient: Tiffany [name]  Procedure(s) Performed: LEFT UPPER ARM BRACHIAL CEPHALIC FISTULA LIGATION W/ ATTEMPTED CEPHALIC VEIN STENTING (Left) LEFT UPPER ARM GORE-TEX GRAFT APPLICATION (Left: Arm Upper)  Patient Location: PACU  Anesthesia Type:MAC  Level of Consciousness: awake and alert   Airway & Oxygen Therapy: Patient Spontanous Breathing  Post-op Assessment: Report given to RN and Post -op Vital signs reviewed and stable  Post vital signs: Reviewed and stable  Last Vitals:  Vitals Value Taken Time  BP 97/52 04/26/22 1132  Temp    Pulse 50 04/26/22 1134  Resp 22 04/26/22 1134  SpO2 92 % 04/26/22 1134  Vitals shown include unvalidated device data.  Last Pain:  Vitals:   04/26/22 0736  PainSc: 0-No pain         Complications: No notable events documented.

## 2022-04-28 ENCOUNTER — Encounter (HOSPITAL_COMMUNITY): Payer: Self-pay | Admitting: Vascular Surgery

## 2022-04-29 ENCOUNTER — Telehealth: Payer: Self-pay | Admitting: Physician Assistant

## 2022-04-29 NOTE — Telephone Encounter (Signed)
-----   Message from Karoline Caldwell, [place] sent at 04/26/2022 11:25 AM EDT ----- S/p left AV graft creation by Dr. Donzetta Matters. She needs follow up incision check in 2-3 weeks. thanks

## 2022-04-29 NOTE — Anesthesia Postprocedure Evaluation (Signed)
Anesthesia Post Note  Patient: Hadiyyah Gibler  Procedure(s) Performed: LEFT UPPER ARM BRACHIAL CEPHALIC FISTULA LIGATION W/ ATTEMPTED CEPHALIC VEIN STENTING (Left) LEFT UPPER ARM GORE-TEX GRAFT APPLICATION (Left: Arm Upper)     Patient location during evaluation: PACU Anesthesia Type: MAC Level of consciousness: awake and alert Pain management: pain level controlled Vital Signs Assessment: post-procedure vital signs reviewed and stable Respiratory status: spontaneous breathing, nonlabored ventilation, respiratory function stable and patient connected to nasal cannula oxygen Cardiovascular status: stable and blood pressure returned to baseline Postop Assessment: no apparent nausea or vomiting Anesthetic complications: no   No notable events documented.  Last Vitals:  Vitals:   04/26/22 1135 04/26/22 1150  BP: (!) 97/52 (!) 100/56  Pulse: (!) 49 (!) 52  Resp: 17 18  Temp: 36.7 C 37.1 C  SpO2: 96% 100%    Last Pain:  Vitals:   04/26/22 1150  PainSc: 0-No pain                 Taveon Enyeart

## 2022-05-03 ENCOUNTER — Other Ambulatory Visit: Payer: Self-pay

## 2022-05-03 MED ORDER — ATORVASTATIN CALCIUM 80 MG PO TABS: ORAL_TABLET | ORAL | 3 refills | Status: DC

## 2022-05-15 ENCOUNTER — Ambulatory Visit (INDEPENDENT_AMBULATORY_CARE_PROVIDER_SITE_OTHER): Payer: Medicare Other | Admitting: Physician Assistant

## 2022-05-15 ENCOUNTER — Encounter: Payer: Self-pay | Admitting: Physician Assistant

## 2022-05-15 VITALS — BP 138/72 | HR 57 | Temp 97.8°F | Wt 207.0 lb

## 2022-05-15 DIAGNOSIS — N186 End stage renal disease: Secondary | ICD-10-CM

## 2022-05-15 NOTE — Progress Notes (Signed)
    Postoperative Access Visit   History of Present Illness   Tiffany [name] is a 72 y.o. year old female who presents for postoperative follow-up for: revision of left arm cephalic vein fistula with transposition, stent of left cephalic vein x 2 Viabahn and revision of left arm AV fistula with cephalic vein to axillary vein interposition PTFE graft by Dr. Randie Heinz on 04/26/22.  The patient's wounds are  healing well. The patient notes mild steal symptoms.  She is experiencing numbness in her thumb through 4th fingers. She says right after surgery she had numbness in her forearm as well but this has improved. She denies any pain or coldness. The numbness is not interfering with her grasping objects but she says it is noticeable when she is trying to hold things.   She currently dialyzes via Right IJ TDC on TTS at the [facility] location  Physical Examination   Vitals:   05/15/22 1404  BP: 138/72  Pulse: (!) 57  Temp: 97.8 F (36.6 C)  TempSrc: Temporal  SpO2: 100%  Weight: 207 lb (93.9 kg)   Body mass index is 30.57 kg/m.  left arm Incisions are intact and healing well, 2+ radial pulse, hand grip is 5/5, sensation in digits is intact, palpable thrill, bruit can be auscultated. Mild swelling in left upper arm    Medical Decision Making   Tiffany [name] is a 72 y.o. year old female who presents s/p revision of left arm cephalic vein fistula with transposition, stent of left cephalic vein x 2 Viabahn and revision of left arm AV fistula with cephalic vein to axillary vein interposition PTFE graft by Dr. Randie Heinz on 04/26/22.Incisions are healing well. Patent has mild signs or symptoms of steal syndrome with some numbness of her thumb through 4th fingers. Discussed with her that this should improve with time. No evidence of ischemia. Encourage her to elevate her left arm to help with swelling The patient's access will be ready for use after 05/27/22 The patient's tunneled dialysis  catheter can be removed when Nephrology is comfortable with the performance of the left AV graft. Her TDC was placed by IR so they can be contacted for removal of her catheter The patient may follow up on a prn basis   Graceann Congress, PA-C [facility] [place] Office: [phone]  Clinic MD: Caesar Bookman

## 2022-05-20 ENCOUNTER — Encounter (HOSPITAL_COMMUNITY): Payer: Self-pay

## 2022-05-22 ENCOUNTER — Encounter (HOSPITAL_COMMUNITY): Payer: Self-pay

## 2022-05-24 ENCOUNTER — Ambulatory Visit: Payer: Medicare Other | Admitting: Podiatry

## 2022-05-24 ENCOUNTER — Encounter: Payer: Self-pay | Admitting: Podiatry

## 2022-05-24 DIAGNOSIS — M7752 Other enthesopathy of left foot: Secondary | ICD-10-CM

## 2022-05-24 DIAGNOSIS — G629 Polyneuropathy, unspecified: Secondary | ICD-10-CM

## 2022-05-26 NOTE — Progress Notes (Signed)
Subjective:   Patient ID: Tiffany [name], female   DOB: 72 y.o.   MRN: [id number]   HPI Patient states she has developed numbness and tingling type discomfort in both her feet around the ball that is been present for several months and she is not having falls but she feels a little bit unsteady.  Patient does not remember any other issues    ROS      Objective:  Physical Exam  Neurovascular status was found to be intact with possibility for low-grade change in sharp dull vibratory.  Patient does not have any muscle strength loss that I could tell and no other real pathology was noted     Assessment:  Possibility for low-grade neuropathy versus possibility for some form of inflammatory condition which may have occurred     Plan:  H&P educated her on findings and at this point I went ahead and I have recommended gabapentin to try to see if this will help and if not we will have to see a neurologist.  I am going to start her on 300 mg at nighttime and if symptoms improve she may stop it at that or she may add 1 morning 1 midday if needed.  She will be seen back and also let her family physician know that this is the active treatment at this time  X-rays were negative for signs of arthritis or any kind of acute injury to either foot

## 2022-06-25 ENCOUNTER — Encounter (HOSPITAL_COMMUNITY): Payer: Self-pay

## 2022-06-25 ENCOUNTER — Ambulatory Visit (HOSPITAL_COMMUNITY)
Admission: EM | Admit: 2022-06-25 | Discharge: 2022-06-25 | Disposition: A | Discharge status: Home / Self Care | Payer: Medicare Other | Attending: Family Medicine | Admitting: Family Medicine

## 2022-06-25 DIAGNOSIS — T148XXA Other injury of unspecified body region, initial encounter: Secondary | ICD-10-CM

## 2022-06-25 NOTE — Discharge Instructions (Signed)
Keep the pressure bandage on overnight and you can take it off in the morning.

## 2022-06-25 NOTE — ED Triage Notes (Signed)
States is a dialysis Patient on heparin. After leaving dialysis today from 1100 to 1530 and getting home, the graft on the inner upper left arm started bleeding. Blood soaked through the guaze and Patient shirt.   MD called in to triage to aide in bandage removal and wound evaluation. Wound no longer oozing blood and pressure dressing placed.   Patient first noticed the bleeding 20 minutes ago.

## 2022-06-25 NOTE — ED Provider Notes (Signed)
[facility]    CSN: [id number] Arrival date & time: 06/25/22  1721      History   Chief Complaint Chief Complaint  Patient presents with   Bleeding/Bruising    HPI Tiffany [name] is a 72 y.o. female.   HPI Here for bleeding from her graft site which she gets dialysis.  She had dialysis earlier today and has been home about 3 hours when she realized that there was blood on her sleeve.  She noticed that there was blood in her OpSite and bandages so she has come here today to be seen  No dizziness and no chest pain or shortness of breath.  No syncope.  She does take Plavix.    Past Medical History:  Diagnosis Date   ALLERGIC RHINITIS    Anxiety    Arthritis    CHF (congestive heart failure) (HCC)    CKD (chronic kidney disease)    Dialysis T/Th/Sa   Coronary artery disease    05/12/21 - cath/ stent placement   COVID 01/21/2021   + HOME TEST  HAD COUGH CONGESTION AND MALAISE, OCC CONGESTION NOW   COVID 2022   mild   Heart murmur    at birth   Hyperlipidemia    Hypertension    Hypothyroid    Iron deficiency anemia    Myocardial infarction (HCC) 05/2021   Obesity    OSA on CPAP    no longer on a cpap   Refusal of blood transfusions as patient is Jehovah's Witness    Shingles 11/2020   LEFT BACK   Type 2 dm with insulin use    checks abg qday runs 125   Wears glasses     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) 04/26/2020   Acute CHF (congestive heart failure) (HCC) 02/01/2020   S/P left TKA 07/01/2019   Status post total left knee replacement 07/01/2019    Hyperlipidemia 09/12/2016   Type 2 diabetes mellitus with hyperlipidemia (HCC) 09/12/2016   Tachycardia 08/26/2016   Palpitation 08/26/2016   SOB (shortness of breath) 08/26/2016   CKD (chronic kidney disease), stage V (HCC) 04/28/2014   Chronic diastolic heart failure (HCC) 05/05/2013   Essential hypertension 05/05/2013   HEART MURMUR, SYSTOLIC 05/02/2009   Hypothyroidism 02/12/2007   OBESITY 01/08/2007   Obstructive sleep apnea 01/08/2007   Seasonal and perennial allergic rhinitis 01/08/2007    Past Surgical History:  Procedure Laterality Date   A/V FISTULAGRAM Left 03/11/2022   Procedure: A/V Fistulagram;  Surgeon: Maeola Harman, MD;  Location: [facility] INVASIVE CV LAB;  Service: Cardiovascular;  Laterality: Left;   AV FISTULA PLACEMENT Left 01/18/2022   Procedure: LEFT ARM ARTERIOVENOUS (AV) FISTULA;  Surgeon: Maeola Harman, MD;  Location: [facility] OR;  Service: Vascular;  Laterality: Left;   BREAST BIOPSY     left breast per pt; benign 20+ yrs ago   BREAST REDUCTION SURGERY  1982   COLONOSCOPY WITH PROPOFOL N/A 10/24/2020   Procedure: COLONOSCOPY WITH PROPOFOL;  Surgeon: Kerin Salen, MD;  Location: [facility];  Service: Gastroenterology;  Laterality: N/A;   CORONARY STENT INTERVENTION N/A  05/12/2021   Procedure: CORONARY STENT INTERVENTION;  Surgeon: Orbie Pyo, MD;  Location: [facility] INVASIVE CV LAB;  Service: Cardiovascular;  Laterality: N/A;   CORONARY/GRAFT ACUTE MI REVASCULARIZATION N/A 05/12/2021   Procedure: Coronary/Graft Acute MI Revascularization;  Surgeon: Orbie Pyo, MD;  Location: [facility] INVASIVE CV LAB;  Service: Cardiovascular;  Laterality: N/A;   FISTULA SUPERFICIALIZATION Left 04/26/2022   Procedure: LEFT UPPER ARM BRACHIAL CEPHALIC FISTULA LIGATION W/ ATTEMPTED CEPHALIC VEIN STENTING;  Surgeon: Maeola Harman, MD;  Location: [facility] OR;  Service: Vascular;  Laterality: Left;   GRAFT APPLICATION Left 04/26/2022   Procedure: LEFT UPPER ARM GORE-TEX  GRAFT APPLICATION;  Surgeon: Maeola Harman, MD;  Location: [facility] OR;  Service: Vascular;  Laterality: Left;   IR FLUORO GUIDE CV LINE RIGHT  10/18/2021   IR US GUIDE VASC ACCESS RIGHT  10/18/2021   KNEE SURGERY Bilateral    x 2   LEFT HEART CATH AND CORONARY ANGIOGRAPHY N/A 05/12/2021   Procedure: LEFT HEART CATH AND CORONARY ANGIOGRAPHY;  Surgeon: Orbie Pyo, MD;  Location: [facility] INVASIVE CV LAB;  Service: Cardiovascular;  Laterality: N/A;   LUMBAR DISC SURGERY     x 2   PERIPHERAL VASCULAR BALLOON ANGIOPLASTY  03/11/2022   Procedure: PERIPHERAL VASCULAR BALLOON ANGIOPLASTY;  Surgeon: Maeola Harman, MD;  Location: [facility] INVASIVE CV LAB;  Service: Cardiovascular;;   POLYPECTOMY  10/24/2020   Procedure: POLYPECTOMY;  Surgeon: Kerin Salen, MD;  Location: [facility];  Service: Gastroenterology;;   REDUCTION MAMMAPLASTY Bilateral 1985   THROMBECTOMY BRACHIAL ARTERY  01/18/2022   Procedure: THROMBECTOMY TO LEFT CEPHALIC VEIN;  Surgeon: Maeola Harman, MD;  Location: [facility] OR;  Service: Vascular;;   TOE SURGERY     yrs ago per pt on 02-02-2021   TOTAL ABDOMINAL HYSTERECTOMY  1997   partial hysterectomy- still has ovaries   TOTAL KNEE ARTHROPLASTY Left 07/01/2019   Procedure: TOTAL KNEE ARTHROPLASTY;  Surgeon: Durene Romans, MD;  Location: [facility] ORS;  Service: Orthopedics;  Laterality: Left;  70 min NO BLOOD PRODUCTS!!   TREATMENT FISTULA ANAL     yrs ago per pt 0n 02-02-2021   TRIGGER FINGER RELEASE Left 02/08/2021   Procedure: Left Ring trigger finger release, left ring finger volar ganglion cyst excision;  Surgeon: Gomez Cleverly, MD;  Location: [facility];  Service: Orthopedics;  Laterality: Left;  with local anesthesia    OB History   No obstetric history on file.      Home Medications    Prior to Admission medications   Medication Sig Start Date End Date Taking? Authorizing Provider  acetaminophen (TYLENOL) 650 MG CR tablet Take 650 mg by mouth  every 8 (eight) hours as needed for pain.   Yes [provider]  allopurinol (ZYLOPRIM) 100 MG tablet Take 1 tablet (100 mg total) by mouth daily. Patient taking differently: Take 100 mg by mouth at bedtime. 10/26/21 06/25/22 Yes Arrien, York Ram, MD  amLODipine (NORVASC) 10 MG tablet Take 10 mg by mouth 2 (two) times daily.   Yes [provider]  atorvastatin (LIPITOR) 80 MG tablet TAKE 1 TABLET BY MOUTH EVERYDAY AT BEDTIME 05/03/22  Yes Orbie Pyo, MD  B Complex-C (B-COMPLEX WITH VITAMIN C) tablet Take 1 tablet by mouth daily in the afternoon.   Yes [provider]  carvedilol (COREG) 25 MG tablet Take 25 mg by mouth 2 (two) times daily with a meal.   Yes [provider]  clopidogrel (PLAVIX) 75 MG  tablet Take 1 tablet (75 mg total) by mouth daily. 04/17/22  Yes Orbie Pyo, MD  fluticasone (FLONASE) 50 MCG/ACT nasal spray Place 1 spray into both nostrils daily as needed for allergies. 08/21/21  Yes [provider]  hydrALAZINE (APRESOLINE) 50 MG tablet Take 50 mg by mouth 2 (two) times daily.   Yes [provider]  levocetirizine (XYZAL) 5 MG tablet Take 5 mg by mouth in the morning. 02/15/22  Yes [provider]  levothyroxine (SYNTHROID) 112 MCG tablet Take 112 mcg by mouth daily at 2 am.   Yes [provider]  Multiple Vitamins-Minerals (PRESERVISION AREDS 2 PO) Take 1 tablet by mouth in the morning.   Yes [provider]  Omega-3 Fatty Acids (FISH OIL) 1000 MG CAPS Take 1,000 mg by mouth at bedtime.   Yes [provider]  Polyethyl Glycol-Propyl Glycol (SYSTANE) 0.4-0.3 % SOLN Place 1 drop into both eyes in the morning, at noon, in the evening, and at bedtime.   Yes [provider]  polyethylene glycol (MIRALAX / GLYCOLAX) 17 g packet Take 17 g by mouth daily as needed for mild constipation or moderate constipation.   Yes [provider]  sevelamer carbonate (RENVELA) 800 MG  tablet Take 1,600 mg by mouth 3 (three) times daily. 01/29/22  Yes [provider]  traZODone (DESYREL) 100 MG tablet Take 100 mg by mouth at bedtime.   Yes [provider]  albuterol (VENTOLIN HFA) 108 (90 Base) MCG/ACT inhaler Inhale 2 puffs into the lungs every 6 (six) hours as needed for wheezing or shortness of breath.    [provider]  B Complex-C-Zn-Folic Acid (DIALYVITE/ZINC) TABS Take 1 tablet by mouth every evening. 11/30/21   [provider]  LORazepam (ATIVAN) 1 MG tablet Take 1 mg by mouth daily as needed for anxiety. 11/30/21   [provider]  nitroGLYCERIN (NITROSTAT) 0.4 MG SL tablet Place 1 tablet (0.4 mg total) under the tongue every 5 (five) minutes x 3 doses as needed for chest pain. 05/20/21   Arty Baumgartner, NP  oxyCODONE-acetaminophen (PERCOCET) 5-325 MG tablet Take 1 tablet by mouth every 6 (six) hours as needed for severe pain. 04/26/22 04/26/23  Graceann Congress, PA-C    Family History Family History  Problem Relation Age of Onset   Prostate cancer Father    Congestive Heart Failure Mother    Diabetes Sister    Other Sister        septis   Breast cancer Neg Hx     Social History Social History   Tobacco Use   Smoking status: Never   Smokeless tobacco: Never  Vaping Use   Vaping Use: Never used  Substance Use Topics   Alcohol use: Not Currently   Drug use: No     Allergies   Other and Sulfa antibiotics   Review of Systems Review of Systems   Physical Exam Triage Vital Signs ED Triage Vitals  Enc Vitals Group     BP 06/25/22 1737 116/65     Pulse Rate 06/25/22 1737 60     Resp 06/25/22 1737 18     Temp 06/25/22 1737 98.1 F (36.7 C)     Temp Source 06/25/22 1737 Oral     SpO2 06/25/22 1737 94 %     Weight --      Height --      Head Circumference --      Peak Flow --      Pain  Score 06/25/22 1734 0     Pain Loc --      Pain Edu? --      Excl. in GC? --    No data found.  Updated  Vital Signs BP 116/65 (BP Location: Right Wrist)   Pulse 60   Temp 98.1 F (36.7 C) (Oral)   Resp 18   SpO2 94%   Visual Acuity Right Eye Distance:   Left Eye Distance:   Bilateral Distance:    Right Eye Near:   Left Eye Near:    Bilateral Near:     Physical Exam Vitals reviewed.  Constitutional:      General: She is not in acute distress.    Appearance: She is not ill-appearing, toxic-appearing or diaphoretic.  Cardiovascular:     Rate and Rhythm: Normal rate and regular rhythm.     Heart sounds: No murmur heard. Pulmonary:     Effort: Pulmonary effort is normal.     Breath sounds: Normal breath sounds.  Skin:    Coloration: Skin is not pale.     Comments: Initially in triage she had an OpSite over her antecubital site and distal upper arm.  It had blood pooling in the OpSite and there were 3 Band-Aids that were soaked through.  Those were all removed and there is a tiny little site that was barely using any blood.  Pressure bandage was then applied   Neurological:     General: No focal deficit present.     Mental Status: She is alert and oriented to person, place, and time.  Psychiatric:        Behavior: Behavior normal.      UC Treatments / Results  Labs (all labs ordered are listed, but only abnormal results are displayed) Labs Reviewed - No data to display  EKG   Radiology No results found.  Procedures Procedures (including critical care time)  Medications Ordered in UC Medications - No data to display  Initial Impression / Assessment and Plan / UC Course  I have reviewed the triage vital signs and the nursing notes.  Pertinent labs & imaging results that were available during my care of the patient were reviewed by me and considered in my medical decision making (see chart for details).        Vital signs are reassuring.  Exam is reassuring once we got the initial bandage off.  Pressure bandage was applied.  After 30 minutes,  it has not  bled and there is no blood oozing through the bottom of the bandage.  Final Clinical Impressions(s) / UC Diagnoses   Final diagnoses:  Bleeding from wound     Discharge Instructions      Keep the pressure bandage on overnight and you can take it off in the morning.     ED Prescriptions   None    PDMP not reviewed this encounter.   Zenia Resides, MD 06/25/22 [phone]

## 2022-07-23 ENCOUNTER — Other Ambulatory Visit: Payer: Self-pay

## 2022-07-23 ENCOUNTER — Encounter (HOSPITAL_COMMUNITY): Payer: Self-pay | Admitting: *Deleted

## 2022-07-23 ENCOUNTER — Ambulatory Visit (HOSPITAL_COMMUNITY)
Admission: EM | Admit: 2022-07-23 | Discharge: 2022-07-23 | Disposition: A | Discharge status: Home / Self Care | Payer: Medicare Other | Attending: Family Medicine | Admitting: Family Medicine

## 2022-07-23 DIAGNOSIS — Z1152 Encounter for screening for COVID-19: Secondary | ICD-10-CM | POA: Insufficient documentation

## 2022-07-23 DIAGNOSIS — J069 Acute upper respiratory infection, unspecified: Secondary | ICD-10-CM | POA: Diagnosis present

## 2022-07-23 DIAGNOSIS — B9789 Other viral agents as the cause of diseases classified elsewhere: Secondary | ICD-10-CM | POA: Insufficient documentation

## 2022-07-23 DIAGNOSIS — Z992 Dependence on renal dialysis: Secondary | ICD-10-CM | POA: Diagnosis not present

## 2022-07-23 DIAGNOSIS — R0602 Shortness of breath: Secondary | ICD-10-CM | POA: Diagnosis not present

## 2022-07-23 DIAGNOSIS — N186 End stage renal disease: Secondary | ICD-10-CM | POA: Insufficient documentation

## 2022-07-23 MED ORDER — CHERATUSSIN AC 100-10 MG/5ML PO SOLN: 5.0000 mL | Freq: Four times a day (QID) | ORAL | 0 refills | Status: DC | PRN

## 2022-07-23 NOTE — ED Provider Notes (Signed)
[facility]    CSN: [id number] Arrival date & time: 07/23/22  1554      History   Chief Complaint Chief Complaint  Patient presents with   Cough    HPI Tiffany [name] is a 72 y.o. female.    Cough  Here for cough and nasal congestion that began on June 14.  She has not had any fever.  No vomiting.  She has felt a little short of breath when she walks across the room, but that is not that much of a change for her.  No wheezing.  She is on dialysis   She is allergic to sulfa   Past Medical History:  Diagnosis Date   ALLERGIC RHINITIS    Anxiety    Arthritis    CHF (congestive heart failure) (HCC)    CKD (chronic kidney disease)    Dialysis T/Th/Sa   Coronary artery disease    05/12/21 - cath/ stent placement   COVID 01/21/2021   + HOME TEST  HAD COUGH CONGESTION AND MALAISE, OCC CONGESTION NOW   COVID 2022   mild   Heart murmur    at birth   Hyperlipidemia    Hypertension    Hypothyroid    Iron deficiency anemia    Myocardial infarction (HCC) 05/2021   Obesity    OSA on CPAP    no longer on a cpap   Refusal of blood transfusions as patient is Jehovah's Witness    Shingles 11/2020   LEFT BACK   Type 2 dm with insulin use    checks abg qday runs 125   Wears glasses     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) 04/26/2020   Acute CHF (congestive heart failure) (HCC) 02/01/2020   S/P left TKA 07/01/2019   Status post total left knee replacement 07/01/2019   Hyperlipidemia 09/12/2016   Type 2 diabetes mellitus with hyperlipidemia (HCC) 09/12/2016   Tachycardia  08/26/2016   Palpitation 08/26/2016   SOB (shortness of breath) 08/26/2016   CKD (chronic kidney disease), stage V (HCC) 04/28/2014   Chronic diastolic heart failure (HCC) 05/05/2013   Essential hypertension 05/05/2013   HEART MURMUR, SYSTOLIC 05/02/2009   Hypothyroidism 02/12/2007   OBESITY 01/08/2007   Obstructive sleep apnea 01/08/2007   Seasonal and perennial allergic rhinitis 01/08/2007    Past Surgical History:  Procedure Laterality Date   A/V FISTULAGRAM Left 03/11/2022   Procedure: A/V Fistulagram;  Surgeon: Maeola Harman, MD;  Location: [facility] INVASIVE CV LAB;  Service: Cardiovascular;  Laterality: Left;   AV FISTULA PLACEMENT Left 01/18/2022   Procedure: LEFT ARM ARTERIOVENOUS (AV) FISTULA;  Surgeon: Maeola Harman, MD;  Location: [facility] OR;  Service: Vascular;  Laterality: Left;   BREAST BIOPSY     left breast per pt; benign 20+ yrs ago   BREAST REDUCTION SURGERY  1982   COLONOSCOPY WITH PROPOFOL N/A 10/24/2020   Procedure: COLONOSCOPY WITH PROPOFOL;  Surgeon: Kerin Salen, MD;  Location: [facility];  Service: Gastroenterology;  Laterality: N/A;   CORONARY STENT INTERVENTION N/A 05/12/2021   Procedure: CORONARY STENT INTERVENTION;  Surgeon: Alverda Skeans  K, MD;  Location: [facility] INVAS[facility] CV LAB;  Service: Cardiovascular;  Laterality: N/A;   CORONARY/GRAFT ACUTE MI REVASCULARIZATION N/A 05/12/2021   Procedure: Coronary/Graft Acute MI Revascularization;  Surgeon: Orbie Pyo, MD;  Location: [facility]VAS[facility] CV LAB;  Service: Cardiovascular;  Laterality: N/A;   FISTULA SUPERFICIALIZATION Left 04/26/2022   Procedure: LEFT UPPER ARM BRACHIAL CEPHALIC FISTULA LIGATION W/ ATTEMPTED CEPHALIC VEIN STENTING;  Surgeon: Maeola Harman, MD;  Location: [facility] OR;  Service: Vascular;  Laterality: Left;   GRAFT APPLICATION Left 04/26/2022   Procedure: LEFT UPPER ARM GORE-TEX GRAFT APPLICATION;  Surgeon: Maeola Harman, MD;  Location: [facility] OR;  Service: Vascular;   Laterality: Left;   IR FLUORO GUIDE CV LINE RIGHT  10/18/2021   IR US GUIDE VASC ACCESS RIGHT  10/18/2021   KNEE SURGERY Bilateral    x 2   LEFT HEART CATH AND CORONARY ANGIOGRAPHY N/A 05/12/2021   Procedure: LEFT HEART CATH AND CORONARY ANGIOGRAPHY;  Surgeon: Orbie Pyo, MD;  [facility]VAS[facility] CV LAB;  Service: Cardiovascular;  Laterality: N/A;   LUMBAR DISC SURGERY     x 2   PERIPHERAL VASCULAR BALLOON ANGIOPLASTY  03/11/2022   Procedure: PERIPHERAL VASCULAR BALLOON ANGIOPLASTY;  Surgeon: Maeola Harman, MD;  Location: [facility] INVASIVE CV LAB;  Service: Cardiovascular;;   POLYPECTOMY  10/24/2020   Procedure: POLYPECTOMY;  Surgeon: Kerin Salen, MD;  Location: [facility];  Service: Gastroenterology;;   REDUCTION MAMMAPLASTY Bilateral 1985   THROMBECTOMY BRACHIAL ARTERY  01/18/2022   Procedure: THROMBECTOMY TO LEFT CEPHALIC VEIN;  Surgeon: Maeola Harman, MD;  Location: [facility] OR;  Service: Vascular;;   TOE SURGERY     yrs ago per pt on 02-02-2021   TOTAL ABDOMINAL HYSTERECTOMY  1997   partial hysterectomy- still has ovaries   TOTAL KNEE ARTHROPLASTY Left 07/01/2019   Procedure: TOTAL KNEE ARTHROPLASTY;  Surgeon: Durene Romans, MD;  Location: [facility] ORS;  Service: Orthopedics;  Laterality: Left;  70 min NO BLOOD PRODUCTS!!   TREATMENT FISTULA ANAL     yrs ago per pt 0n 02-02-2021   TRIGGER FINGER RELEASE Left 02/08/2021   Procedure: Left Ring trigger finger release, left ring finger volar ganglion cyst excision;  Surgeon: Gomez Cleverly, MD;  Location: [facility];  Service: Orthopedics;  Laterality: Left;  with local anesthesia    OB History   No obstetric history on file.      Home Medications    Prior to Admission medications   Medication Sig Start Date End Date Taking? Authorizing Provider  acetaminophen (TYLENOL) 650 MG CR tablet Take 650 mg by mouth every 8 (eight) hours as needed for pain.   Yes [provider]  albuterol (VENTOLIN  HFA) 108 (90 Base) MCG/ACT inhaler Inhale 2 puffs into the lungs every 6 (six) hours as needed for wheezing or shortness of breath.   Yes [provider]  allopurinol (ZYLOPRIM) 100 MG tablet Take 1 tablet (100 mg total) by mouth daily. Patient taking differently: Take 100 mg by mouth at bedtime. 10/26/21 07/23/22 Yes Arrien, York Ram, MD  amLODipine (NORVASC) 10 MG tablet Take 10 mg by mouth 2 (two) times daily.   Yes [provider]  atorvastatin (LIPITOR) 80 MG tablet TAKE 1 TABLET BY MOUTH EVERYDAY AT BEDTIME 05/03/22  Yes Orbie Pyo, MD  B Complex-C (B-COMPLEX WITH VITAMIN C) tablet Take 1 tablet by mouth daily in the afternoon.   Yes [provider]  B Complex-C-Zn-Folic Acid (DIALYVITE/ZINC) TABS Take  1 tablet by mouth every evening. 11/30/21  Yes [provider]  carvedilol (COREG) 25 MG tablet Take 25 mg by mouth 2 (two) times daily with a meal.   Yes [provider]  clopidogrel (PLAVIX) 75 MG tablet Take 1 tablet (75 mg total) by mouth daily. 04/17/22  Yes Orbie Pyo, MD  fluticasone (FLONASE) 50 MCG/ACT nasal spray Place 1 spray into both nostrils daily as needed for allergies. 08/21/21  Yes [provider]  guaiFENesin-codeine (CHERATUSSIN AC) 100-10 MG/5ML syrup Take 5 mLs by mouth 4 (four) times daily as needed for cough. 07/23/22  Yes Zenia Resides, MD  hydrALAZINE (APRESOLINE) 50 MG tablet Take 50 mg by mouth 2 (two) times daily.   Yes [provider]  levocetirizine (XYZAL) 5 MG tablet Take 5 mg by mouth in the morning. 02/15/22  Yes [provider]  levothyroxine (SYNTHROID) 112 MCG tablet Take 112 mcg by mouth daily at 2 am.   Yes [provider]  LORazepam (ATIVAN) 1 MG tablet Take 1 mg by mouth daily as needed for anxiety. 11/30/21  Yes [provider]  Multiple Vitamins-Minerals (PRESERVISION AREDS 2 PO) Take 1 tablet by mouth in the morning.   Yes [provider]   Omega-3 Fatty Acids (FISH OIL) 1000 MG CAPS Take 1,000 mg by mouth at bedtime.   Yes [provider]  Polyethyl Glycol-Propyl Glycol (SYSTANE) 0.4-0.3 % SOLN Place 1 drop into both eyes in the morning, at noon, in the evening, and at bedtime.   Yes [provider]  polyethylene glycol (MIRALAX / GLYCOLAX) 17 g packet Take 17 g by mouth daily as needed for mild constipation or moderate constipation.   Yes [provider]  sevelamer carbonate (RENVELA) 800 MG tablet Take 1,600 mg by mouth 3 (three) times daily. 01/29/22  Yes [provider]  traZODone (DESYREL) 100 MG tablet Take 100 mg by mouth at bedtime.   Yes [provider]  nitroGLYCERIN (NITROSTAT) 0.4 MG SL tablet Place 1 tablet (0.4 mg total) under the tongue every 5 (five) minutes x 3 doses as needed for chest pain. 05/20/21   Arty Baumgartner, NP    Family History Family History  Problem Relation Age of Onset   Prostate cancer Father    Congestive Heart Failure Mother    Diabetes Sister    Other Sister        septis   Breast cancer Neg Hx     Social History Social History   Tobacco Use   Smoking status: Never   Smokeless tobacco: Never  Vaping Use   Vaping Use: Never used  Substance Use Topics   Alcohol use: Not Currently   Drug use: No     Allergies   Other and Sulfa antibiotics   Review of Systems Review of Systems  Respiratory:  Positive for cough.      Physical Exam Triage Vital Signs ED Triage Vitals  Enc Vitals Group     BP 07/23/22 1613 125/64     Pulse Rate 07/23/22 1613 64     Resp 07/23/22 1613 18     Temp 07/23/22 1613 98 F (36.7 C)     Temp src --      SpO2 07/23/22 1613 98 %     Weight --      Height --      Head Circumference --      Peak Flow --      Pain Score 07/23/22  1609 0     Pain Loc --      Pain Edu? --      Excl. in GC? --    No data found.  Updated Vital Signs BP 125/64   Pulse 64   Temp 98 F (36.7 C)   Resp 18    SpO2 98%   Visual Acuity Right Eye Distance:   Left Eye Distance:   Bilateral Distance:    Right Eye Near:   Left Eye Near:    Bilateral Near:     Physical Exam Vitals reviewed.  Constitutional:      General: She is not in acute distress.    Appearance: She is not toxic-appearing.  HENT:     Right Ear: Tympanic membrane and ear canal normal.     Left Ear: Tympanic membrane and ear canal normal.     Nose: Congestion present.     Mouth/Throat:     Mouth: Mucous membranes are moist.     Comments: There is clear mucus draining in the oropharynx Eyes:     Extraocular Movements: Extraocular movements intact.     Conjunctiva/sclera: Conjunctivae normal.     Pupils: Pupils are equal, round, and reactive to light.  Cardiovascular:     Rate and Rhythm: Normal rate and regular rhythm.     Heart sounds: No murmur heard. Pulmonary:     Effort: Pulmonary effort is normal. No respiratory distress.     Breath sounds: No stridor. No wheezing, rhonchi or rales.  Musculoskeletal:     Cervical back: Neck supple.  Lymphadenopathy:     Cervical: No cervical adenopathy.  Skin:    Capillary Refill: Capillary refill takes less than 2 seconds.     Coloration: Skin is not jaundiced or pale.  Neurological:     General: No focal deficit present.     Mental Status: She is alert and oriented to person, place, and time.  Psychiatric:        Behavior: Behavior normal.      UC Treatments / Results  Labs (all labs ordered are listed, but only abnormal results are displayed) Labs Reviewed  SARS CORONAVIRUS 2 (TAT 6-24 HRS)    EKG   Radiology No results found.  Procedures Procedures (including critical care time)  Medications Ordered in UC Medications - No data to display  Initial Impression / Assessment and Plan / UC Course  I have reviewed the triage vital signs and the nursing notes.  Pertinent labs & imaging results that were available during my care of the patient were reviewed  by me and considered in my medical decision making (see chart for details).        She is in no distress and vital signs and physical exam are reassuring.  Robitussin with codeine is sent in for relief of her cough at night.  COVID swab was done and if positive she would be a candidate for molnupiravir.  She is on dialysis so therefore cannot have Paxlovid Final Clinical Impressions(s) / UC Diagnoses   Final diagnoses:  Viral URI with cough     Discharge Instructions      Robitussin with codeine cough syrup--take 5 mL or 1 teaspoon every 6 hours as needed for cough.   You have been swabbed for COVID, and the test will result in the next 24 hours. Our staff will call you if positive. If the COVID test is positive, you should quarantine until you are fever free for 24 hours and  you are starting to feel better, and then take added precautions for the next 5 days, such as physical distancing/wearing a mask and good hand hygiene/washing.       ED Prescriptions     Medication Sig Dispense Auth. Provider   guaiFENesin-codeine (CHERATUSSIN AC) 100-10 MG/5ML syrup Take 5 mLs by mouth 4 (four) times daily as needed for cough. 120 mL Zenia Resides, MD      I have reviewed the PDMP during this encounter.   Zenia Resides, MD 07/23/22 [phone]

## 2022-07-23 NOTE — Discharge Instructions (Signed)
Robitussin with codeine cough syrup--take 5 mL or 1 teaspoon every 6 hours as needed for cough.   You have been swabbed for COVID, and the test will result in the next 24 hours. Our staff will call you if positive. If the COVID test is positive, you should quarantine until you are fever free for 24 hours and you are starting to feel better, and then take added precautions for the next 5 days, such as physical distancing/wearing a mask and good hand hygiene/washing.

## 2022-07-23 NOTE — ED Triage Notes (Signed)
Pt reports she has had a cough since Friday. Pt unable to sleep due to cough.

## 2022-07-24 LAB — SARS CORONAVIRUS 2 (TAT 6-24 HRS): SARS Coronavirus 2: NEGATIVE

## 2022-08-05 ENCOUNTER — Other Ambulatory Visit: Payer: Self-pay | Admitting: Internal Medicine

## 2022-08-05 DIAGNOSIS — Z1231 Encounter for screening mammogram for malignant neoplasm of breast: Secondary | ICD-10-CM

## 2022-08-09 ENCOUNTER — Other Ambulatory Visit: Payer: Self-pay | Admitting: Internal Medicine

## 2022-08-09 DIAGNOSIS — I10 Essential (primary) hypertension: Secondary | ICD-10-CM

## 2022-08-12 ENCOUNTER — Other Ambulatory Visit: Payer: Self-pay | Admitting: Internal Medicine

## 2022-08-13 LAB — LIPID PANEL
Cholesterol: 98 mg/dL (ref <200)
HDL: 50 mg/dL (ref >=50)
LDL Cholesterol (Calc): 31 mg/dL (calc)
Non-HDL Cholesterol (Calc): 48 mg/dL (calc) (ref <130)
Total CHOL/HDL Ratio: 2 (calc) (ref <5.0)
Triglycerides: 86 mg/dL (ref <150)

## 2022-08-13 LAB — CBC
HCT: 30.6 % — ABNORMAL LOW (ref 35.0–45.0)
Hemoglobin: 9.4 g/dL — ABNORMAL LOW (ref 11.7–15.5)
MCH: 28.2 pg (ref 27.0–33.0)
MCHC: 30.7 g/dL — ABNORMAL LOW (ref 32.0–36.0)
MCV: 91.9 fL (ref 80.0–100.0)
MPV: 12.2 fL (ref 7.5–12.5)
Platelets: 96 10*3/uL — ABNORMAL LOW (ref 140–400)
RBC: 3.33 10*6/uL — ABNORMAL LOW (ref 3.80–5.10)
RDW: 14.2 % (ref 11.0–15.0)
WBC: 4.7 10*3/uL (ref 3.8–10.8)

## 2022-08-13 LAB — COMPLETE METABOLIC PANEL WITH GFR
AG Ratio: 1.5 (calc) (ref 1.0–2.5)
ALT: 14 U/L (ref 6–29)
AST: 17 U/L (ref 10–35)
Albumin: 3.8 g/dL (ref 3.6–5.1)
Alkaline phosphatase (APISO): 146 U/L (ref 37–153)
BUN/Creatinine Ratio: 9 (calc) (ref 6–22)
BUN: 52 mg/dL — ABNORMAL HIGH (ref 7–25)
CO2: 28 mmol/L (ref 20–32)
Calcium: 9.5 mg/dL (ref 8.6–10.4)
Chloride: 98 mmol/L (ref 98–110)
Creat: 6.07 mg/dL — ABNORMAL HIGH (ref 0.60–1.00)
Globulin: 2.5 g/dL (calc) (ref 1.9–3.7)
Glucose, Bld: 234 mg/dL — ABNORMAL HIGH (ref 65–99)
Potassium: 5.1 mmol/L (ref 3.5–5.3)
Sodium: 139 mmol/L (ref 135–146)
Total Bilirubin: 0.4 mg/dL (ref 0.2–1.2)
Total Protein: 6.3 g/dL (ref 6.1–8.1)
eGFR: 7 mL/min/{1.73_m2} — ABNORMAL LOW (ref >=60)

## 2022-08-13 LAB — TSH: TSH: 13.09 mIU/L — ABNORMAL HIGH (ref 0.40–4.50)

## 2022-08-13 LAB — T4, FREE: Free T4: 1.3 ng/dL (ref 0.8–1.8)

## 2022-09-09 ENCOUNTER — Ambulatory Visit: Admission: RE | Admit: 2022-09-09 | Payer: Medicare Other | Source: Ambulatory Visit

## 2022-09-09 DIAGNOSIS — Z1231 Encounter for screening mammogram for malignant neoplasm of breast: Secondary | ICD-10-CM

## 2022-09-09 HISTORY — DX: Systemic involvement of connective tissue, unspecified: M35.9

## 2022-09-22 ENCOUNTER — Other Ambulatory Visit: Payer: Self-pay | Admitting: Cardiology

## 2022-10-01 ENCOUNTER — Other Ambulatory Visit: Payer: Self-pay | Admitting: Internal Medicine

## 2022-10-13 NOTE — Progress Notes (Addendum)
Cardiology Office Note    Patient Name: Tiffany [name] Date of Encounter: 10/13/2022  Primary Care Provider:  Fleet Contras, MD Primary Cardiologist:  Lesleigh Noe, MD (Inactive) Primary Electrophysiologist: None   Past Medical History    Past Medical History:  Diagnosis Date   ALLERGIC RHINITIS    Anxiety    Arthritis    CHF (congestive heart failure) (HCC)    CKD (chronic kidney disease)    Dialysis T/Th/Sa   Collagen vascular disease (HCC)    Coronary artery disease    05/12/21 - cath/ stent placement   COVID 01/21/2021   + HOME TEST  HAD COUGH CONGESTION AND MALAISE, OCC CONGESTION NOW   COVID 2022   mild   Heart murmur    at birth   Hyperlipidemia    Hypertension    Hypothyroid    Iron deficiency anemia    Myocardial infarction (HCC) 05/2021   Obesity    OSA on CPAP    no longer on a cpap   Refusal of blood transfusions as patient is Jehovah's Witness    Shingles 11/2020   LEFT BACK   Type 2 dm with insulin use    checks abg qday runs 125   Wears glasses     History of Present Illness  Tiffany [name] is a 72 y.o. female with a PMH of CAD s/p inferior STEMI 05/2021 treated with DES to PDA diastolic HF, Jehovah's Witness, HTN, DM, anemia, ESRD (Tu,Thu,Sat), OSA (on CPAP) who presents today for 34-month follow-up.  Tiffany [name] was followed initially by Dr. Katrinka Blazing and is currently followed by Dr. Lynnette Caffey.  She suffered an inferior STEMI caused by thrombotic occlusion on 05/2021 and was treated with DES x 2 to PDA.  She was last seen on 04/17/2022 and was doing well with no new cardiac complaints.  She was advised to stop ASA on May 1 and continue Plavix monotherapy indefinitely. Her blood pressure was well-controlled and no other medication changes made at that time.  During today's visit the patient reports that she has been feeling very tired and fatigued with occasional episodes of dizziness.  In further discussion she reports having difficulty with her CPAP  and is currently not tolerating her mask.  She reports waking multiple times throughout the night.  Her blood pressure today is controlled at 118/58 and heart rate is 55 bpm.  She is compliant with her current medications and denies any adverse reactions.  During today's visit we discussed the pathophysiology of diastolic CHF and also reviewed mitral valve regurgitation.  She had all questions answered to her satisfaction today and I advised her that we will reach out to our sleep coordinator for information regarding replacement mask..  Patient denies chest pain, palpitations, dyspnea, PND, orthopnea, nausea, vomiting, dizziness, syncope, edema, weight gain, or early satiety.  Review of Systems  Please see the history of present illness.    All other systems reviewed and are otherwise negative except as noted above.  Physical Exam    Wt Readings from Last 3 Encounters:  05/15/22 207 lb (93.9 kg)  04/26/22 209 lb (94.8 kg)  04/17/22 209 lb 3.2 oz (94.9 kg)   SE:GBTDV were no vitals filed for this visit.,There is no height or weight on file to calculate BMI. GEN: Well nourished, well developed in no acute distress Neck: No JVD; No carotid bruits Pulmonary: Clear to auscultation without rales, wheezing or rhonchi  Cardiovascular: Normal rate. Regular rhythm. Normal S1. Normal  S2.   Murmurs: There is no murmur.  ABDOMEN: Soft, non-tender, non-distended EXTREMITIES:  No edema; No deformity   EKG/LABS/ Recent Cardiac Studies   ECG personally reviewed by me today -sinus bradycardia with rate of 54 bpm and no acute changes consistent with previous EKG.  Risk Assessment/Calculations:          Lab Results  Component Value Date   WBC 4.7 08/12/2022   HGB 9.4 (L) 08/12/2022   HCT 30.6 (L) 08/12/2022   MCV 91.9 08/12/2022   PLT 96 (L) 08/12/2022   Lab Results  Component Value Date   CREATININE 6.07 (H) 08/12/2022   BUN 52 (H) 08/12/2022   NA 139 08/12/2022   K 5.1 08/12/2022   CL 98  08/12/2022   CO2 28 08/12/2022   Lab Results  Component Value Date   CHOL 98 08/12/2022   HDL 50 08/12/2022   LDLCALC 31 08/12/2022   TRIG 86 08/12/2022   CHOLHDL 2.0 08/12/2022    Lab Results  Component Value Date   HGBA1C 6.2 (H) 10/17/2021   Assessment & Plan    1.  Coronary artery disease: -s/p 100% occlusion of PDA treated with DES x2, thrombotic occlusion during procedure at the ostium of the PDA treated with DES x2 -Today patient reports that she has been feeling well with no new cardiac complaints or anginal equivalent -Continue current GDMT with Plavix 75 mg daily, carvedilol 25 mg twice daily, hydralazine 50 mg twice daily, Lipitor 80 mg daily  2.  HFpEF: -2D echo completed last on 05/2021 showing hyperdynamic EF of 70-75% with moderate calcified MV and trivial MVR, concentric LVH, severely dilated LA/mildly dilated RA -Today patient reports that she has been feeling bouts of tiredness and dizziness with occasional episodes of SOB. -She is euvolemic on examination and current volume status managed by dialysis. -Will repeat 2D echo to evaluate for increased fatigue and shortness of breath with dizziness -Continue current GDMT with hydralazine 50 mg twice daily and carvedilol 25 mg twice daily, GDMT limited due to ESRD. -Low sodium diet, fluid restriction <2L, and daily weights encouraged. Educated to contact our office for weight gain of 2 lbs overnight or 5 lbs in one week.   3.  Essential hypertension: -Patient's blood pressure today was well-controlled at 118/58 -Continue carvedilol 25 mg twice daily and hydralazine 50 mg twice daily  4.  ESRD: -Dialyzed on Tuesday, Thursday, Saturday -Patient appears euvolemic on examination today  5.  Obstructive sleep apnea: -Patient reports that she is not able to tolerate her current mask and is currently noncompliant -We will refer her to her sleep coordinator for further guidance on selecting a more comfortable mask.  6.   Preoperative clearance: -Patient's RCRI score is 11% -Patient recently underwent 2D echo that showed normal EF and no RWMA with mildly elevated PASP and small pericardial effusion present.  The patient affirms she has been doing well without any new cardiac symptoms. They are able to achieve 4 METS without cardiac limitations. Therefore, based on ACC/AHA guidelines, the patient would be at acceptable risk for the planned procedure without further cardiovascular testing. The patient was advised that if she develops new symptoms prior to surgery to contact our office to arrange for a follow-up visit, and she verbalized understanding.    Disposition: Follow-up with Lesleigh Noe, MD (Inactive) or APP in 6 months    Signed, [name], Leodis Rains, NP 10/13/2022, 4:37 PM [facility]

## 2022-10-14 ENCOUNTER — Encounter: Payer: Self-pay | Admitting: Nurse Practitioner

## 2022-10-14 ENCOUNTER — Ambulatory Visit: Payer: Medicare Other | Attending: Nurse Practitioner | Admitting: Nurse Practitioner

## 2022-10-14 VITALS — BP 118/58 | HR 55 | Ht 69.0 in | Wt 216.0 lb

## 2022-10-14 DIAGNOSIS — N186 End stage renal disease: Secondary | ICD-10-CM | POA: Diagnosis not present

## 2022-10-14 DIAGNOSIS — I2510 Atherosclerotic heart disease of native coronary artery without angina pectoris: Secondary | ICD-10-CM

## 2022-10-14 DIAGNOSIS — E785 Hyperlipidemia, unspecified: Secondary | ICD-10-CM | POA: Diagnosis not present

## 2022-10-14 DIAGNOSIS — I10 Essential (primary) hypertension: Secondary | ICD-10-CM | POA: Diagnosis not present

## 2022-10-14 DIAGNOSIS — G4733 Obstructive sleep apnea (adult) (pediatric): Secondary | ICD-10-CM

## 2022-10-14 NOTE — Patient Instructions (Signed)
Medication Instructions:  Your physician recommends that you continue on your current medications as directed. Please refer to the Current Medication list given to you today. *If you need a refill on your cardiac medications before your next appointment, please call your pharmacy*   Lab Work: None ordered   Testing/Procedures: Your physician has requested that you have an echocardiogram. Echocardiography is a painless test that uses sound waves to create images of your heart. It provides your doctor with information about the size and shape of your heart and how well your heart's chambers and valves are working. This procedure takes approximately one hour. There are no restrictions for this procedure. Please do NOT wear cologne, perfume, aftershave, or lotions (deodorant is allowed). Please arrive 15 minutes prior to your appointment time.   Follow-Up: At [facility], you and your health needs are our priority.  As part of our continuing mission to provide you with exceptional heart care, we have created designated Provider Care Teams.  These Care Teams include your primary Cardiologist (physician) and Advanced Practice Providers (APPs -  Physician Assistants and Nurse Practitioners) who all work together to provide you with the care you need, when you need it.  We recommend signing up for the patient portal called "MyChart".  Sign up information is provided on this After Visit Summary.  MyChart is used to connect with patients for Virtual Visits (Telemedicine).  Patients are able to view lab/test results, encounter notes, upcoming appointments, etc.  Non-urgent messages can be sent to your provider as well.   To learn more about what you can do with MyChart, go to [email or web address].    Your next appointment:   6 month(s)  Provider:   Robin Searing, NP or Alverda Skeans, MD    Other Instructions

## 2022-10-23 ENCOUNTER — Telehealth: Payer: Self-pay | Admitting: Physical Medicine and Rehabilitation

## 2022-10-23 NOTE — Telephone Encounter (Signed)
Appt made

## 2022-10-23 NOTE — Telephone Encounter (Signed)
Pt would like to speak with you about spinal simulation and getting an appt set please advise, stated she started the process last year but could not finish

## 2022-10-25 ENCOUNTER — Ambulatory Visit: Payer: Medicare Other | Admitting: Physical Medicine and Rehabilitation

## 2022-10-25 VITALS — BP 128/74 | HR 64

## 2022-10-25 DIAGNOSIS — M5416 Radiculopathy, lumbar region: Secondary | ICD-10-CM

## 2022-10-25 DIAGNOSIS — G894 Chronic pain syndrome: Secondary | ICD-10-CM

## 2022-10-25 DIAGNOSIS — M961 Postlaminectomy syndrome, not elsewhere classified: Secondary | ICD-10-CM

## 2022-10-25 NOTE — Progress Notes (Unsigned)
Tiffany [name] - 72 y.o. female MRN [id number]  Date of birth: [birth year]  Office Visit Note: Visit Date: 10/25/2022 PCP: Fleet Contras, MD Referred by: Fleet Contras, MD  Subjective: Chief Complaint  Patient presents with   Lower Back - Pain    Discuss spinal stimulator Has had several ESI in L-spine.   HPI: Tiffany [name] is a 72 y.o. female who comes in today for evaluation of chronic, worsening and severe bilateral lower back pain radiating to buttocks and down posterior thighs to knees. Pain ongoing for several years, worsens with sitting, walking and activity. She describes pain as sore and aching sensation, currently rates as 7 out of 10. She has tried physician directed home exercise regimen, rest and medications without significant relief of pain. History of chronic pain management with Dr. Herminio Heads. Patient's recent lumbar MRI exhibits solid L3-L4 posterior lumbar interbody fusion and previous left laminectomy noted at L4-L5. There is mild to moderate canal stenosis and bilateral subarticular recess stenosis without definite impingement. No high grade spinal canal stenosis noted. Patient reports history of 2 lumbar surgeries 20+ years ago. Patient has history of multiple lumbar steroid injections performed in our office with intermittent relief of pain. We previously discussed spinal cord stimulator trial with patient, she was placed on Plavix last year following cardiac stent placement. It has been over a year, she continues with Plavix but believes she will be able to come off for SCS procedure. Patient denies focal weakness, numbness and tingling. Patient denies recent trauma or falls. Patient currently ambulating with cane.  Patients course is complicated by diabetes mellitus, CHF and end stage renal disease. She currently undergoing hemodialysis on Monday, Thursday and Saturday.       Review of Systems  Musculoskeletal:  Positive for back pain.  Neurological:   Negative for tingling, sensory change, focal weakness and weakness.  All other systems reviewed and are negative.  Otherwise per HPI.  Assessment & Plan: Visit Diagnoses:    ICD-10-CM   1. Lumbar radiculopathy  M54.16     2. Post laminectomy syndrome  M96.1     3. Chronic pain syndrome  G89.4        Plan: Findings:  Chronic, worsening and severe bilateral lower back pain radiating to buttocks and down posterior thighs to knees. Patient continues to have severe pain despite good conservative therapies such as home exercise, rest and use of medications. Patients clinical presentation and exam are consistent with S1 nerve pattern. I do think patient would be ideal candidate for spinal cord stimulator trial with her history of chronic pain and failed back surgery. She has also failed conservative therapies and received minimal relief of pain with previous lumbar epidural steroid injections. Dr. Alvester Morin and myself discussed spinal cord stimulator trial and permanent implant process with her in detail today. We will need to contact Dr. Verdis Prime to get approved for her to discontinue Plavix for SCS trial. She did undergo neuropsychological evaluation last year, I will check with Enrique Sack with [facility] [employer] and ensure we do not need new evaluation. Patient has no questions at this time. No red flag symptoms noted upon exam today.     Meds & Orders: No orders of the defined types were placed in this encounter.  No orders of the defined types were placed in this encounter.   Follow-up: No follow-ups on file.   Procedures: No procedures performed      Clinical History: EXAM: MRI LUMBAR SPINE WITHOUT  CONTRAST   TECHNIQUE: Multiplanar, multisequence MR imaging of the lumbar spine was performed. No intravenous contrast was administered.   COMPARISON:  09/27/2017.   FINDINGS: Segmentation:  Standard.   Alignment:  No substantial sagittal subluxation   Vertebrae: No specific evidence  of fracture or discitis/osteomyelitis. L3-L4 PLIF. No evidence of abnormal enhancement.   Conus medullaris and cauda equina: Conus extends to the T12 level. Conus appears normal. No evidence of abnormal enhancement of the conus or cauda equina.   Paraspinal and other soft tissues: Bilateral renal cysts.   Disc levels:   Motion limited assessment.   T12-L1: No significant disc protrusion, foraminal stenosis, or canal stenosis.   L1-L2: No significant disc protrusion, foraminal stenosis, or canal stenosis.   L2-L3: Disc bulge, facet arthropathy, and ligamentum flavum thickening. Resulting mild canal stenosis, similar. Mild right foraminal stenosis.   L3-L4: Previous PLIF with solid fusion across the disc space. Patent canal and foramina.   L4-L5: Previous left laminectomy endplate spurring and mild disc bulging. Similar mild bilateral subarticular recess narrowing without significant canal stenosis. Evaluation of foramina is limited by artifact without evidence of compressive foraminal stenosis.   L5-S1: Posterior disc bulge and endplate spurring. Bilateral facet arthropathy. Similar mild to moderate canal stenosis and bilateral subarticular recess stenosis without definite impingement. Similar probably mild bilateral foraminal stenosis without definite impingement.   IMPRESSION: 1. Motion limited study with similar multilevel degenerative change, as detailed above. 2. Solid L3-L4 PLIF with patent canal and foramina at this level.     Electronically Signed   By: Feliberto Harts M.D.   On: 04/24/2021 08:35   She reports that she has never smoked. She has never used smokeless tobacco. No results for input(s): "HGBA1C", "LABURIC" in the last 8760 hours.  Objective:  VS:  HT:    WT:   BMI:     BP:128/74  HR:64bpm  TEMP: ( )  RESP:  Physical Exam Vitals and nursing note reviewed.  HENT:     Right Ear: External ear normal.     Left Ear: External ear normal.      Nose: Nose normal.     Mouth/Throat:     Mouth: Mucous membranes are moist.  Eyes:     Extraocular Movements: Extraocular movements intact.  Cardiovascular:     Rate and Rhythm: Normal rate.     Pulses: Normal pulses.  Pulmonary:     Effort: Pulmonary effort is normal.  Abdominal:     General: Abdomen is flat. There is no distension.  Musculoskeletal:        General: Tenderness present.     Cervical back: Normal range of motion.     Comments: Patient is slow to rise from seated position to standing. Good lumbar range of motion. No pain noted with facet loading. 5/5 strength noted with bilateral hip flexion, knee flexion/extension, ankle dorsiflexion/plantarflexion and EHL. No clonus noted bilaterally. No pain upon palpation of greater trochanters. No pain with internal/external rotation of bilateral hips. Sensation intact bilaterally. Dysesthesias noted to bilateral S1 dermatomes. Negative slump test bilaterally. Ambulates with cane, gait slow and unsteady.     Skin:    General: Skin is warm and dry.     Capillary Refill: Capillary refill takes less than 2 seconds.  Neurological:     General: No focal deficit present.     Mental Status: She is alert and oriented to person, place, and time.  Psychiatric:        Mood and Affect: Mood  normal.        Behavior: Behavior normal.     Ortho Exam  Imaging: No results found.  Past Medical/Family/Surgical/Social History: Medications & Allergies reviewed per EMR, new medications updated. 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) 04/26/2020   Acute CHF  (congestive heart failure) (HCC) 02/01/2020   S/P left TKA 07/01/2019   Status post total left knee replacement 07/01/2019   Hyperlipidemia 09/12/2016   Type 2 diabetes mellitus with hyperlipidemia (HCC) 09/12/2016   Tachycardia 08/26/2016   Palpitation 08/26/2016   SOB (shortness of breath) 08/26/2016   CKD (chronic kidney disease), stage V (HCC) 04/28/2014   Chronic diastolic heart failure (HCC) 05/05/2013   Essential hypertension 05/05/2013   HEART MURMUR, SYSTOLIC 05/02/2009   Hypothyroidism 02/12/2007   OBESITY 01/08/2007   Obstructive sleep apnea 01/08/2007   Seasonal and perennial allergic rhinitis 01/08/2007   Past Medical History:  Diagnosis Date   ALLERGIC RHINITIS    Anxiety    Arthritis    CHF (congestive heart failure) (HCC)    CKD (chronic kidney disease)    Dialysis T/Th/Sa   Collagen vascular disease (HCC)    Coronary artery disease    05/12/21 - cath/ stent placement   COVID 01/21/2021   + HOME TEST  HAD COUGH CONGESTION AND MALAISE, OCC CONGESTION NOW   COVID 2022   mild   Heart murmur    at birth   Hyperlipidemia    Hypertension    Hypothyroid    Iron deficiency anemia    Myocardial infarction (HCC) 05/2021   Obesity    OSA on CPAP    no longer on a cpap   Refusal of blood transfusions as patient is Jehovah's Witness    Shingles 11/2020   LEFT BACK   Type 2 dm with insulin use    checks abg qday runs 125   Wears glasses    Family History  Problem Relation Age of Onset   Prostate cancer Father    Congestive Heart Failure Mother    Diabetes Sister    Other Sister        septis   Breast cancer Neg Hx    Past Surgical History:  Procedure Laterality Date   A/V FISTULAGRAM Left 03/11/2022   Procedure: A/V Fistulagram;  Surgeon: Maeola Harman, MD;  Location: [facility] INVASIVE CV LAB;  Service: Cardiovascular;  Laterality: Left;   AV FISTULA PLACEMENT Left 01/18/2022   Procedure: LEFT ARM ARTERIOVENOUS (AV) FISTULA;  Surgeon: Maeola Harman, MD;  Location: [facility] OR;  Service: Vascular;  Laterality: Left;   BREAST BIOPSY     left breast per pt; benign 20+ yrs ago   BREAST REDUCTION SURGERY  1982   COLONOSCOPY WITH PROPOFOL N/A 10/24/2020   Procedure: COLONOSCOPY WITH PROPOFOL;  Surgeon: Kerin Salen, MD;  Location: [facility] ENDOSCOPY;  Service: Gastroenterology;  Laterality: N/A;   CORONARY STENT INTERVENTION N/A 05/12/2021   Procedure: CORONARY STENT INTERVENTION;  Surgeon: Orbie Pyo, MD;  Location: [facility] INVASIVE CV LAB;  Service: Cardiovascular;  Laterality: N/A;   CORONARY/GRAFT ACUTE MI REVASCULARIZATION N/A 05/12/2021   Procedure: Coronary/Graft Acute  MI Revascularization;  Surgeon: Orbie Pyo, MD;  Location: [facility] INVASIVE CV LAB;  Service: Cardiovascular;  Laterality: N/A;   FISTULA SUPERFICIALIZATION Left 04/26/2022   Procedure: LEFT UPPER ARM BRACHIAL CEPHALIC FISTULA LIGATION W/ ATTEMPTED CEPHALIC VEIN STENTING;  Surgeon: Maeola Harman, MD;  Location: [facility] OR;  Service: Vascular;  Laterality: Left;   GRAFT APPLICATION Left 04/26/2022   Procedure: LEFT UPPER ARM GORE-TEX GRAFT APPLICATION;  Surgeon: Maeola Harman, MD;  Location: [facility] OR;  Service: Vascular;  Laterality: Left;   IR FLUORO GUIDE CV LINE RIGHT  10/18/2021   IR US GUIDE VASC ACCESS RIGHT  10/18/2021   KNEE SURGERY Bilateral    x 2   LEFT HEART CATH AND CORONARY ANGIOGRAPHY N/A 05/12/2021   Procedure: LEFT HEART CATH AND CORONARY ANGIOGRAPHY;  Surgeon: Orbie Pyo, MD;  Location: [facility] INVASIVE CV LAB;  Service: Cardiovascular;  Laterality: N/A;   LUMBAR DISC SURGERY     x 2   PERIPHERAL VASCULAR BALLOON ANGIOPLASTY  03/11/2022   Procedure: PERIPHERAL VASCULAR BALLOON ANGIOPLASTY;  Surgeon: Maeola Harman, MD;  Location: [facility] INVASIVE CV LAB;  Service: Cardiovascular;;   POLYPECTOMY  10/24/2020   Procedure: POLYPECTOMY;  Surgeon: Kerin Salen, MD;  Location: [facility]COPY;  Service: Gastroenterology;;   REDUCTION  MAMMAPLASTY Bilateral 1985   THROMBECTOMY BRACHIAL ARTERY  01/18/2022   Procedure: THROMBECTOMY TO LEFT CEPHALIC VEIN;  Surgeon: Maeola Harman, MD;  Location: [facility] OR;  Service: Vascular;;   TOE SURGERY     yrs ago per pt on 02-02-2021   TOTAL ABDOMINAL HYSTERECTOMY  1997   partial hysterectomy- still has ovaries   TOTAL KNEE ARTHROPLASTY Left 07/01/2019   Procedure: TOTAL KNEE ARTHROPLASTY;  Surgeon: Durene Romans, MD;  Location: [facility] ORS;  Service: Orthopedics;  Laterality: Left;  70 min NO BLOOD PRODUCTS!!   TREATMENT FISTULA ANAL     yrs ago per pt 0n 02-02-2021   TRIGGER FINGER RELEASE Left 02/08/2021   Procedure: Left Ring trigger finger release, left ring finger volar ganglion cyst excision;  Surgeon: Gomez Cleverly, MD;  Location: [facility];  Service: Orthopedics;  Laterality: Left;  with local anesthesia   Social History   Occupational History   Occupation: school bus driver  Tobacco Use   Smoking status: Never   Smokeless tobacco: Never  Vaping Use   Vaping status: Never Used  Substance and Sexual Activity   Alcohol use: Not Currently   Drug use: No   Sexual activity: Not Currently    Birth control/protection: Surgical

## 2022-10-28 ENCOUNTER — Encounter: Payer: Self-pay | Admitting: Physical Medicine and Rehabilitation

## 2022-10-28 ENCOUNTER — Ambulatory Visit (HOSPITAL_COMMUNITY): Payer: Medicare Other | Attending: Nurse Practitioner

## 2022-10-28 DIAGNOSIS — N186 End stage renal disease: Secondary | ICD-10-CM | POA: Diagnosis present

## 2022-10-28 DIAGNOSIS — E785 Hyperlipidemia, unspecified: Secondary | ICD-10-CM | POA: Diagnosis present

## 2022-10-28 DIAGNOSIS — I2510 Atherosclerotic heart disease of native coronary artery without angina pectoris: Secondary | ICD-10-CM | POA: Insufficient documentation

## 2022-10-28 DIAGNOSIS — I10 Essential (primary) hypertension: Secondary | ICD-10-CM | POA: Diagnosis present

## 2022-10-28 DIAGNOSIS — G4733 Obstructive sleep apnea (adult) (pediatric): Secondary | ICD-10-CM | POA: Diagnosis present

## 2022-10-28 LAB — ECHOCARDIOGRAM COMPLETE
AR max vel: 1.33 cm2
AV Area VTI: 1.54 cm2
AV Area mean vel: 1.46 cm2
AV Mean grad: 8.7 mmHg
AV Peak grad: 17.5 mmHg
Ao pk vel: 2.09 m/s
Area-P 1/2: 2.22 cm2
S' Lateral: 2.8 cm

## 2022-11-06 ENCOUNTER — Telehealth: Payer: Self-pay | Admitting: Internal Medicine

## 2022-11-06 NOTE — Telephone Encounter (Signed)
Pre-operative Risk Assessment    Patient Name: Tiffany [name]  DOB: 1950/10/08 MRN: [id number]  LAST VISIT 04/17/22 NEXT VISIT 04/07/23     Request for Surgical Clearance        Procedure:   Spinal cord stimulator  Date of Surgery:  Clearance TBD                                 Surgeon:  Not indicated Surgeon's Group or Practice Name:  Cyndia Skeeters at [facility] Phone number:  [phone] Fax number:  [phone]   Type of Clearance Requested:   - Medical    Type of Anesthesia:  Not Indicated   Additional requests/questions:   Provider requests Plavix to be held 7 days prior  Sharen Hones   11/06/2022, 10:11 AM

## 2022-11-06 NOTE — Telephone Encounter (Signed)
Good Morning Dr.Thukkani  We have received a surgical clearance request for Tiffany [name] to have a nerve stimulator placed. She has a PMH of inferior STEMI 05/2021 with thrombotic occlusion and PDA that was treated with DES x 2 to the PDA. She is currently on Plavix monotherapy indefinitely per your note.  Can please provide guidance on holding Plavix for upcoming nerve stimulator procedure. Please forward you guidance and recommendations to P CV DIV PREOP   Thank you Robin Searing, NP

## 2022-11-06 NOTE — Telephone Encounter (Signed)
Patient Name: Tiffany [name]  DOB: Mar 01, 1950 MRN: [id number]  Primary Cardiologist: Lesleigh Noe, MD (Inactive)  Chart reviewed as part of pre-operative protocol coverage. Given past medical history and time since last visit, based on ACC/AHA guidelines, [name] is at acceptable risk for the planned procedure without further cardiovascular testing.   In regards to holding Plavix for 7 days per Dr.Thukkani She can stop the plavix 5 days prior to the procedure and restart when possible.   If  willing to do the procedure on ASA 81mg , I would have her stop her plavix 5 days before the procedure, start ASA 81 mg, then after the procedure stop the ASA and resume plavix without a load.  The patient was advised that if she develops new symptoms prior to surgery to contact our office to arrange for a follow-up visit, and she verbalized understanding.  I will route this recommendation to the requesting party via Epic fax function and remove from pre-op pool.  Please call with questions.  [name], Leodis Rains, NP 11/06/2022, 11:20 AM

## 2022-11-11 ENCOUNTER — Telehealth: Payer: Self-pay

## 2022-11-11 NOTE — Telephone Encounter (Signed)
Submitted SCS for authorization in [employer] portal

## 2022-11-22 ENCOUNTER — Other Ambulatory Visit: Payer: Self-pay | Admitting: Physical Medicine and Rehabilitation

## 2022-11-22 ENCOUNTER — Telehealth: Payer: Self-pay

## 2022-11-22 MED ORDER — DIAZEPAM 5 MG PO TABS: ORAL_TABLET | ORAL | 0 refills | Status: DC

## 2022-11-22 NOTE — Telephone Encounter (Signed)
SCS trial scheduled for 12/05/22. Needs Valium sent to [facility]

## 2022-12-05 ENCOUNTER — Other Ambulatory Visit: Payer: Self-pay

## 2022-12-05 ENCOUNTER — Ambulatory Visit (INDEPENDENT_AMBULATORY_CARE_PROVIDER_SITE_OTHER): Payer: Medicare Other | Admitting: Physical Medicine and Rehabilitation

## 2022-12-05 VITALS — BP 129/73 | HR 61

## 2022-12-05 DIAGNOSIS — M5416 Radiculopathy, lumbar region: Secondary | ICD-10-CM

## 2022-12-05 DIAGNOSIS — M961 Postlaminectomy syndrome, not elsewhere classified: Secondary | ICD-10-CM

## 2022-12-05 DIAGNOSIS — G894 Chronic pain syndrome: Secondary | ICD-10-CM

## 2022-12-05 MED ORDER — LIDOCAINE HCL (PF) 1 % IJ SOLN
2.0000 mL | Freq: Once | INTRAMUSCULAR | Status: AC
  Administered 2022-12-05: 2 mL

## 2022-12-05 MED ORDER — CEPHALEXIN 500 MG PO CAPS: 500.0000 mg | ORAL_CAPSULE | Freq: Two times a day (BID) | ORAL | 0 refills | Status: DC

## 2022-12-05 NOTE — Procedures (Signed)
Advanced programming analysis performed with the [employer] technical representative with physician input and  monitoring. [employer] FAST and contour programming with  combined therapy initiated. 

## 2022-12-05 NOTE — Progress Notes (Signed)
Functional Pain Scale - descriptive words and definitions  Distracting (5)    Aware of pain/able to complete some ADL's but limited by pain/sleep is affected and active distractions are only slightly useful. Moderate range order  Average Pain 6-7   +Driver, -BT, -Dye Allergies.  Lower back pain

## 2022-12-11 ENCOUNTER — Encounter: Payer: Self-pay | Admitting: Physical Medicine and Rehabilitation

## 2022-12-11 ENCOUNTER — Ambulatory Visit (INDEPENDENT_AMBULATORY_CARE_PROVIDER_SITE_OTHER): Payer: Medicare Other | Admitting: Physical Medicine and Rehabilitation

## 2022-12-11 DIAGNOSIS — G894 Chronic pain syndrome: Secondary | ICD-10-CM

## 2022-12-11 DIAGNOSIS — M5416 Radiculopathy, lumbar region: Secondary | ICD-10-CM

## 2022-12-11 DIAGNOSIS — M961 Postlaminectomy syndrome, not elsewhere classified: Secondary | ICD-10-CM

## 2022-12-11 NOTE — Progress Notes (Signed)
Jyoti Harju - 72 y.o. female MRN [id number]  Date of birth: 06-18-1950  Office Visit Note: Visit Date: 12/11/2022 PCP: Fleet Contras, MD Referred by: Fleet Contras, MD  Subjective: No chief complaint on file.  HPI: Cassanda Walmer is a 72 y.o. female who comes in today for spinal cord stimulator lead pull and management of chronic worsening severe pain which has been recalcitrant to previous treatment.  She reports greater than 75% pain relief with the stimulator trial and is very pleased with the amount of relief and functional increase he has obtained.  She is able to walk and do more with the stimulator in place.  She does want to move forward with implant. She will re-start her Plavix.        ROS Otherwise per HPI.  Assessment & Plan: Visit Diagnoses:    ICD-10-CM   1. Lumbar radiculopathy  M54.16 Ambulatory referral to Pain Clinic    2. Post laminectomy syndrome  M96.1 Ambulatory referral to Pain Clinic    3. Chronic pain syndrome  G89.4 Ambulatory referral to Pain Clinic       Plan: No additional findings.   Meds & Orders: No orders of the defined types were placed in this encounter.   Orders Placed This Encounter  Procedures   Ambulatory referral to Pain Clinic    Follow-up: Return if symptoms worsen or fail to improve.   Procedures: Quick procedure note: Patient was placed prone on the exam table. The external stimulator/generator was unfastened from the leads. The outer Tegaderm was removed from the bandage carefully. There was no noted erythema of the skin or swelling or induration or drainage. There had been some bloody discharge in the gauze pad. Both trial leads were pulled without difficulty and without discomfort. There was no drainage from the insertion site. Band-Aid was applied.   Clinical History: EXAM: MRI LUMBAR SPINE WITHOUT CONTRAST   TECHNIQUE: Multiplanar, multisequence MR imaging of the lumbar spine was performed. No intravenous  contrast was administered.   COMPARISON:  09/27/2017.   FINDINGS: Segmentation:  Standard.   Alignment:  No substantial sagittal subluxation   Vertebrae: No specific evidence of fracture or discitis/osteomyelitis. L3-L4 PLIF. No evidence of abnormal enhancement.   Conus medullaris and cauda equina: Conus extends to the T12 level. Conus appears normal. No evidence of abnormal enhancement of the conus or cauda equina.   Paraspinal and other soft tissues: Bilateral renal cysts.   Disc levels:   Motion limited assessment.   T12-L1: No significant disc protrusion, foraminal stenosis, or canal stenosis.   L1-L2: No significant disc protrusion, foraminal stenosis, or canal stenosis.   L2-L3: Disc bulge, facet arthropathy, and ligamentum flavum thickening. Resulting mild canal stenosis, similar. Mild right foraminal stenosis.   L3-L4: Previous PLIF with solid fusion across the disc space. Patent canal and foramina.   L4-L5: Previous left laminectomy endplate spurring and mild disc bulging. Similar mild bilateral subarticular recess narrowing without significant canal stenosis. Evaluation of foramina is limited by artifact without evidence of compressive foraminal stenosis.   L5-S1: Posterior disc bulge and endplate spurring. Bilateral facet arthropathy. Similar mild to moderate canal stenosis and bilateral subarticular recess stenosis without definite impingement. Similar probably mild bilateral foraminal stenosis without definite impingement.   IMPRESSION: 1. Motion limited study with similar multilevel degenerative change, as detailed above. 2. Solid L3-L4 PLIF with patent canal and foramina at this level.     Electronically Signed   By: Juluis Mire.D.  On: 04/24/2021 08:35   She reports that she has never smoked. She has never used smokeless tobacco. No results for input(s): "HGBA1C", "LABURIC" in the last 8760 hours.  Objective:  VS:  HT:    WT:    BMI:     BP:   HR: bpm  TEMP: ( )  RESP:  Physical Exam Skin:    Comments: No redness swelling or drainage at injection site     Ortho Exam  Imaging: No results found.  Past Medical/Family/Surgical/Social History: Medications & Allergies reviewed per EMR, new medications updated. 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) 04/26/2020   Acute CHF (congestive heart failure) (HCC) 02/01/2020   S/P left TKA 07/01/2019   Status post total left knee replacement 07/01/2019   Hyperlipidemia 09/12/2016   Type 2 diabetes mellitus with hyperlipidemia (HCC) 09/12/2016   Tachycardia 08/26/2016   Palpitation 08/26/2016   SOB (shortness of breath) 08/26/2016   CKD (chronic kidney disease), stage V (HCC) 04/28/2014   Chronic diastolic heart failure (HCC) 05/05/2013   Essential hypertension 05/05/2013   HEART MURMUR, SYSTOLIC 05/02/2009   Hypothyroidism 02/12/2007   OBESITY 01/08/2007   Obstructive sleep apnea 01/08/2007   Seasonal and perennial allergic rhinitis 01/08/2007   Past Medical History:  Diagnosis Date   ALLERGIC RHINITIS    Anxiety    Arthritis    CHF (congestive heart failure) (HCC)    CKD (chronic kidney disease)    Dialysis T/Th/Sa   Collagen vascular disease (HCC)    Coronary artery disease    05/12/21 - cath/ stent placement   COVID 01/21/2021   + HOME TEST  HAD COUGH CONGESTION AND MALAISE, OCC CONGESTION NOW   COVID 2022   mild   Heart murmur    at birth   Hyperlipidemia    Hypertension    Hypothyroid    Iron deficiency anemia    Myocardial infarction (HCC) 05/2021   Obesity    OSA  on CPAP    no longer on a cpap   Refusal of blood transfusions as patient is Jehovah's Witness    Shingles 11/2020   LEFT BACK   Type 2 dm with insulin use    checks abg qday runs 125   Wears glasses    Family History  Problem Relation Age of Onset   Prostate cancer Father    Congestive Heart Failure Mother    Diabetes Sister    Other Sister        septis   Breast cancer Neg Hx    Past Surgical History:  Procedure Laterality Date   A/V FISTULAGRAM Left 03/11/2022   Procedure: A/V Fistulagram;  Surgeon: Maeola Harman, MD;  Location[facility] INVASIVE CV LAB;  Service: Cardiovascular;  Laterality: Left;   AV FISTULA PLACEMENT Left 01/18/2022   Procedure: LEFT ARM ARTERIOVENOUS (AV) FISTULA;  Surgeon: Maeola Harman, MD;  Location: [facility] OR;  Service: Vascular;  Laterality: Left;   BREAST BIOPSY     left breast per pt; benign 20+ yrs ago   BREAST REDUCTION SURGERY  1982   COLONOSCOPY WITH PROPOFOL N/A 10/24/2020   Procedure:  COLONOSCOPY WITH PROPOFOL;  Surgeon: Kerin Salen, MD;  Location: [facility];  Service: Gastroenterology;  Laterality: N/A;   CORONARY STENT INTERVENTION N/A 05/12/2021   Procedure: CORONARY STENT INTERVENTION;  Surgeon: Orbie Pyo, MD;  Location: [facility] INVASIVE CV LAB;  Service: Cardiovascular;  Laterality: N/A;   CORONARY/GRAFT ACUTE MI REVASCULARIZATION N/A 05/12/2021   Procedure: Coronary/Graft Acute MI Revascularization;  Surgeon: Orbie Pyo, MD;  Location: [facility] INVASIVE CV LAB;  Service: Cardiovascular;  Laterality: N/A;   FISTULA SUPERFICIALIZATION Left 04/26/2022   Procedure: LEFT UPPER ARM BRACHIAL CEPHALIC FISTULA LIGATION W/ ATTEMPTED CEPHALIC VEIN STENTING;  Surgeon: Maeola Harman, MD;  Location: [facility] OR;  Service: Vascular;  Laterality: Left;   GRAFT APPLICATION Left 04/26/2022   Procedure: LEFT UPPER ARM GORE-TEX GRAFT APPLICATION;  Surgeon: Maeola Harman, MD;  Location: [facility] OR;  Service: Vascular;   Laterality: Left;   IR FLUORO GUIDE CV LINE RIGHT  10/18/2021   IR US GUIDE VASC ACCESS RIGHT  10/18/2021   KNEE SURGERY Bilateral    x 2   LEFT HEART CATH AND CORONARY ANGIOGRAPHY N/A 05/12/2021   Procedure: LEFT HEART CATH AND CORONARY ANGIOGRAPHY;  Surgeon: Orbie Pyo, MD;  Location: [facility] INVASIVE CV LAB;  Service: Cardiovascular;  Laterality: N/A;   LUMBAR DISC SURGERY     x 2   PERIPHERAL VASCULAR BALLOON ANGIOPLASTY  03/11/2022   Procedure: PERIPHERAL VASCULAR BALLOON ANGIOPLASTY;  Surgeon: Maeola Harman, MD;  Location: [facility] INVASIVE CV LAB;  Service: Cardiovascular;;   POLYPECTOMY  10/24/2020   Procedure: POLYPECTOMY;  Surgeon: Kerin Salen, MD;  Location: [facility];  Service: Gastroenterology;;   REDUCTION MAMMAPLASTY Bilateral 1985   THROMBECTOMY BRACHIAL ARTERY  01/18/2022   Procedure: THROMBECTOMY TO LEFT CEPHALIC VEIN;  Surgeon: Maeola Harman, MD;  Location: [facility] OR;  Service: Vascular;;   TOE SURGERY     yrs ago per pt on 02-02-2021   TOTAL ABDOMINAL HYSTERECTOMY  1997   partial hysterectomy- still has ovaries   TOTAL KNEE ARTHROPLASTY Left 07/01/2019   Procedure: TOTAL KNEE ARTHROPLASTY;  Surgeon: Durene Romans, MD;  Location: [facility] ORS;  Service: Orthopedics;  Laterality: Left;  70 min NO BLOOD PRODUCTS!!   TREATMENT FISTULA ANAL     yrs ago per pt 0n 02-02-2021   TRIGGER FINGER RELEASE Left 02/08/2021   Procedure: Left Ring trigger finger release, left ring finger volar ganglion cyst excision;  Surgeon: Gomez Cleverly, MD;  Location: [facility];  Service: Orthopedics;  Laterality: Left;  with local anesthesia   Social History   Occupational History   Occupation: school bus driver  Tobacco Use   Smoking status: Never   Smokeless tobacco: Never  Vaping Use   Vaping status: Never Used  Substance and Sexual Activity   Alcohol use: Not Currently   Drug use: No   Sexual activity: Not Currently    Birth control/protection:  Surgical

## 2022-12-11 NOTE — Progress Notes (Signed)
  Follow up after SCS trial, she received 70% relief.

## 2023-02-17 ENCOUNTER — Encounter: Payer: Self-pay | Admitting: Nurse Practitioner

## 2023-02-17 ENCOUNTER — Inpatient Hospital Stay: Payer: Medicare Other | Attending: Nurse Practitioner | Admitting: Nurse Practitioner

## 2023-02-17 ENCOUNTER — Inpatient Hospital Stay: Payer: Medicare Other

## 2023-02-17 VITALS — BP 164/71 | HR 55 | Temp 98.0°F | Resp 18 | Wt 227.8 lb

## 2023-02-17 DIAGNOSIS — Z882 Allergy status to sulfonamides status: Secondary | ICD-10-CM | POA: Diagnosis not present

## 2023-02-17 DIAGNOSIS — Z992 Dependence on renal dialysis: Secondary | ICD-10-CM | POA: Diagnosis not present

## 2023-02-17 DIAGNOSIS — G4733 Obstructive sleep apnea (adult) (pediatric): Secondary | ICD-10-CM | POA: Insufficient documentation

## 2023-02-17 DIAGNOSIS — Z90710 Acquired absence of both cervix and uterus: Secondary | ICD-10-CM | POA: Insufficient documentation

## 2023-02-17 DIAGNOSIS — Z7902 Long term (current) use of antithrombotics/antiplatelets: Secondary | ICD-10-CM | POA: Insufficient documentation

## 2023-02-17 DIAGNOSIS — D696 Thrombocytopenia, unspecified: Secondary | ICD-10-CM | POA: Diagnosis not present

## 2023-02-17 DIAGNOSIS — I252 Old myocardial infarction: Secondary | ICD-10-CM | POA: Diagnosis not present

## 2023-02-17 DIAGNOSIS — Z8042 Family history of malignant neoplasm of prostate: Secondary | ICD-10-CM | POA: Diagnosis not present

## 2023-02-17 DIAGNOSIS — Z833 Family history of diabetes mellitus: Secondary | ICD-10-CM | POA: Insufficient documentation

## 2023-02-17 DIAGNOSIS — R531 Weakness: Secondary | ICD-10-CM | POA: Insufficient documentation

## 2023-02-17 DIAGNOSIS — G4730 Sleep apnea, unspecified: Secondary | ICD-10-CM

## 2023-02-17 DIAGNOSIS — Z832 Family history of diseases of the blood and blood-forming organs and certain disorders involving the immune mechanism: Secondary | ICD-10-CM

## 2023-02-17 DIAGNOSIS — Z8249 Family history of ischemic heart disease and other diseases of the circulatory system: Secondary | ICD-10-CM | POA: Insufficient documentation

## 2023-02-17 DIAGNOSIS — D631 Anemia in chronic kidney disease: Secondary | ICD-10-CM | POA: Insufficient documentation

## 2023-02-17 DIAGNOSIS — E039 Hypothyroidism, unspecified: Secondary | ICD-10-CM | POA: Diagnosis not present

## 2023-02-17 DIAGNOSIS — E785 Hyperlipidemia, unspecified: Secondary | ICD-10-CM | POA: Insufficient documentation

## 2023-02-17 DIAGNOSIS — Z79899 Other long term (current) drug therapy: Secondary | ICD-10-CM | POA: Insufficient documentation

## 2023-02-17 DIAGNOSIS — Z79890 Hormone replacement therapy (postmenopausal): Secondary | ICD-10-CM | POA: Insufficient documentation

## 2023-02-17 DIAGNOSIS — G8929 Other chronic pain: Secondary | ICD-10-CM | POA: Insufficient documentation

## 2023-02-17 DIAGNOSIS — M549 Dorsalgia, unspecified: Secondary | ICD-10-CM | POA: Diagnosis not present

## 2023-02-17 DIAGNOSIS — I2510 Atherosclerotic heart disease of native coronary artery without angina pectoris: Secondary | ICD-10-CM

## 2023-02-17 DIAGNOSIS — I509 Heart failure, unspecified: Secondary | ICD-10-CM | POA: Insufficient documentation

## 2023-02-17 DIAGNOSIS — E1122 Type 2 diabetes mellitus with diabetic chronic kidney disease: Secondary | ICD-10-CM | POA: Insufficient documentation

## 2023-02-17 DIAGNOSIS — I219 Acute myocardial infarction, unspecified: Secondary | ICD-10-CM | POA: Diagnosis not present

## 2023-02-17 DIAGNOSIS — Z8616 Personal history of COVID-19: Secondary | ICD-10-CM | POA: Diagnosis not present

## 2023-02-17 DIAGNOSIS — I132 Hypertensive heart and chronic kidney disease with heart failure and with stage 5 chronic kidney disease, or end stage renal disease: Secondary | ICD-10-CM | POA: Diagnosis not present

## 2023-02-17 DIAGNOSIS — N186 End stage renal disease: Secondary | ICD-10-CM | POA: Diagnosis not present

## 2023-02-17 LAB — CBC WITH DIFFERENTIAL (CANCER CENTER ONLY)
Abs Immature Granulocytes: 0.02 10*3/uL (ref 0.00–0.07)
Basophils Absolute: 0 10*3/uL (ref 0.0–0.1)
Basophils Relative: 0 %
Eosinophils Absolute: 0.1 10*3/uL (ref 0.0–0.5)
Eosinophils Relative: 3 %
HCT: 30.8 % — ABNORMAL LOW (ref 36.0–46.0)
Hemoglobin: 9.9 g/dL — ABNORMAL LOW (ref 12.0–15.0)
Immature Granulocytes: 0 %
Lymphocytes Relative: 24 %
Lymphs Abs: 1.1 10*3/uL (ref 0.7–4.0)
MCH: 29 pg (ref 26.0–34.0)
MCHC: 32.1 g/dL (ref 30.0–36.0)
MCV: 90.3 fL (ref 80.0–100.0)
Monocytes Absolute: 0.6 10*3/uL (ref 0.1–1.0)
Monocytes Relative: 13 %
Neutro Abs: 2.6 10*3/uL (ref 1.7–7.7)
Neutrophils Relative %: 60 %
Platelet Count: 86 10*3/uL — ABNORMAL LOW (ref 150–400)
RBC: 3.41 MIL/uL — ABNORMAL LOW (ref 3.87–5.11)
RDW: 14.8 % (ref 11.5–15.5)
WBC Count: 4.5 10*3/uL (ref 4.0–10.5)
nRBC: 0 % (ref 0.0–0.2)

## 2023-02-17 LAB — RETIC PANEL
Immature Retic Fract: 13.2 % (ref 2.3–15.9)
RBC.: 3.52 MIL/uL — ABNORMAL LOW (ref 3.87–5.11)
Retic Count, Absolute: 35.2 10*3/uL (ref 19.0–186.0)
Retic Ct Pct: 1 % (ref 0.4–3.1)
Reticulocyte Hemoglobin: 29.4 pg (ref >27.9)

## 2023-02-17 LAB — HEPATITIS C ANTIBODY: HCV Ab: NONREACTIVE

## 2023-02-17 LAB — WBC/PLT IN CITRATE

## 2023-02-17 LAB — IRON AND IRON BINDING CAPACITY (CC-WL,HP ONLY)
Iron: 103 ug/dL (ref 28–170)
Saturation Ratios: 45 % — ABNORMAL HIGH (ref 10.4–31.8)
TIBC: 230 ug/dL — ABNORMAL LOW (ref 250–450)
UIBC: 127 ug/dL — ABNORMAL LOW (ref 148–442)

## 2023-02-17 LAB — FERRITIN: Ferritin: 1237 ng/mL — ABNORMAL HIGH (ref 11–307)

## 2023-02-17 LAB — VITAMIN B12: Vitamin B-12: 4034 pg/mL — ABNORMAL HIGH (ref 180–914)

## 2023-02-17 LAB — IMMATURE PLATELET FRACTION: Immature Platelet Fraction: 7.7 % (ref 1.2–8.6)

## 2023-02-17 NOTE — Progress Notes (Addendum)
 [facility]   [phone] [phone]   Clinic New consult Note   Patient Care Team: Shelda Atlas, MD as PCP - General (Internal Medicine) Claudene Victory ORN, MD (Inactive) as PCP - Cardiology (Cardiology) Center, [facility] 02/17/2023  CHIEF COMPLAINTS/PURPOSE OF CONSULTATION:  Thrombocytopenia, referred by PCP Dr. Atlas Shelda  HISTORY OF PRESENTING ILLNESS:  Tiffany [name] 73 y.o. female with PMH including CHF, HTN, HL, STEMI, OSA, hypothyroidism, DM, CKD/ESRD, and chronic anemia (intermittent from 05/2008 - 04/2012, gap in records until 08/2016 then anemia was persistent) is here because of thrombocytopenia.  Platelet count was normal on 02/25/2018 at 223K and 06/23/2019 at 159K then dropped to 07/02/19 to 117K during admission for left knee replacement. Platelets normalized by 01/25/2020 but dropped again by 02/01/20 during acute on chronic CHF exacerbation.  A CT abdomen/pelvis 06/04/2020 showed no evidence of cirrhosis or splenomegaly.  Further studies 09/30/2020 folate was normal, B12 elevated over 7000.  Previous iron studies showed ferritin >400 and normal/low TIBC. Had MI in 05/2021, HIV on admission was non-reactive; Hepatitis B on 10/18/2021 antigen was nonreactive, antibodies inconsistent with immunity.  Platelet count worsened in the past year or so, since 10/2021 to 64 - 84 K range. Most recent plt count of 70K on 01/10/2023. Denies acute or chronic viral illness, autoimmune condition, bariatric surgery, heavy alcohol  use, new medication.  She is up-to-date on cancer screenings.  Socially, she is single, 2 children who are not biological.  Tired schoolbus driver, independent with ADLs.  Drinks alcohol  1-2 times a month, a never smoker, and denies other drug use.  Family history significant for a sister with anemia and a brother with prostate cancer.  Today she presents by herself, feeling weak and tired in general, with exertional dyspnea.   Denies bruising or bleeding, recent viral illness, unintentional weight loss, fever, chills, cough, chest pain, change in bowel habits, adenopathy, increased pain.  She has chronic back pain has had 2 back surgeries, due for recent spinal stimulator but held due to platelet count of 70K and referred to us .    MEDICAL HISTORY:  Past Medical History:  Diagnosis Date   ALLERGIC RHINITIS    Anxiety    Arthritis    CHF (congestive heart failure) (HCC)    CKD (chronic kidney disease)    Dialysis T/Th/Sa   Collagen vascular disease (HCC)    Coronary artery disease    05/12/21 - cath/ stent placement   COVID 01/21/2021   + HOME TEST  HAD COUGH CONGESTION AND MALAISE, OCC CONGESTION NOW   COVID 2022   mild   Heart murmur    at birth   Hyperlipidemia    Hypertension    Hypothyroid    Iron deficiency anemia    Myocardial infarction (HCC) 05/2021   Obesity    OSA on CPAP    no longer on a cpap   Refusal of blood transfusions as patient is Jehovah's Witness    Shingles 11/2020   LEFT BACK   Type 2 dm with insulin  use    checks abg qday runs 125   Wears glasses     SURGICAL HISTORY: Past Surgical History:  Procedure Laterality Date   A/V FISTULAGRAM Left 03/11/2022   Procedure: A/V Fistulagram;  Surgeon: Sheree Penne Bruckner, MD;  Location: [facility] INVASIVE CV LAB;  Service: Cardiovascular;  Laterality: Left;   AV FISTULA PLACEMENT Left 01/18/2022   Procedure: LEFT ARM ARTERIOVENOUS (AV) FISTULA;  Surgeon: Sheree Penne Bruckner,  MD;  Location: [facility] OR;  Service: Vascular;  Laterality: Left;   BREAST BIOPSY     left breast per pt; benign 20+ yrs ago   BREAST REDUCTION SURGERY  1982   COLONOSCOPY WITH PROPOFOL  N/A 10/24/2020   Procedure: COLONOSCOPY WITH PROPOFOL ;  Surgeon: Saintclair Jasper, MD;  Location: [facility];  Service: Gastroenterology;  Laterality: N/A;   CORONARY STENT INTERVENTION N/A 05/12/2021   Procedure: CORONARY STENT INTERVENTION;  Surgeon: Wendel Lurena POUR, MD;   Location: [facility] INVASIVE CV LAB;  Service: Cardiovascular;  Laterality: N/A;   CORONARY/GRAFT ACUTE MI REVASCULARIZATION N/A 05/12/2021   Procedure: Coronary/Graft Acute MI Revascularization;  Surgeon: Wendel Lurena POUR, MD;  Location: [facility] INVASIVE CV LAB;  Service: Cardiovascular;  Laterality: N/A;   FISTULA SUPERFICIALIZATION Left 04/26/2022   Procedure: LEFT UPPER ARM BRACHIAL CEPHALIC FISTULA LIGATION W/ ATTEMPTED CEPHALIC VEIN STENTING;  Surgeon: Sheree Penne Bruckner, MD;  Location[facility];  Service: Vascular;  Laterality: Left;   GRAFT APPLICATION Left 04/26/2022   Procedure: LEFT UPPER ARM GORE-TEX GRAFT APPLICATION;  Surgeon: Sheree Penne Bruckner, MD;  Location: [facility] OR;  Service: Vascular;  Laterality: Left;   IR FLUORO GUIDE CV LINE RIGHT  10/18/2021   IR US  GUIDE VASC ACCESS RIGHT  10/18/2021   KNEE SURGERY Bilateral    x 2   LEFT HEART CATH AND CORONARY ANGIOGRAPHY N/A 05/12/2021   Procedure: LEFT HEART CATH AND CORONARY ANGIOGRAPHY;  Surgeon: Wendel Lurena POUR, MD;  Location: [facility] INVASIVE CV LAB;  Service: Cardiovascular;  Laterality: N/A;   LUMBAR DISC SURGERY     x 2   PERIPHERAL VASCULAR BALLOON ANGIOPLASTY  03/11/2022   Procedure: PERIPHERAL VASCULAR BALLOON ANGIOPLASTY;  Surgeon: Sheree Penne Bruckner, MD;  Location: [facility] INVASIVE CV LAB;  Service: Cardiovascular;;   POLYPECTOMY  10/24/2020   Procedure: POLYPECTOMY;  Surgeon: Saintclair Jasper, MD;  Location: [facility];  Service: Gastroenterology;;   REDUCTION MAMMAPLASTY Bilateral 1985   THROMBECTOMY BRACHIAL ARTERY  01/18/2022   Procedure: THROMBECTOMY TO LEFT CEPHALIC VEIN;  Surgeon: Sheree Penne Bruckner, MD;  Location: [facility] OR;  Service: Vascular;;   TOE SURGERY     yrs ago per pt on 02-02-2021   TOTAL ABDOMINAL HYSTERECTOMY  1997   partial hysterectomy- still has ovaries   TOTAL KNEE ARTHROPLASTY Left 07/01/2019   Procedure: TOTAL KNEE ARTHROPLASTY;  Surgeon: Ernie Cough, MD;  Location: [facility] ORS;  Service:  Orthopedics;  Laterality: Left;  70 min NO BLOOD PRODUCTS!!   TREATMENT FISTULA ANAL     yrs ago per pt 0n 02-02-2021   TRIGGER FINGER RELEASE Left 02/08/2021   Procedure: Left Ring trigger finger release, left ring finger volar ganglion cyst excision;  Surgeon: Alyse Agent, MD;  Location: [facility];  Service: Orthopedics;  Laterality: Left;  with local anesthesia    SOCIAL HISTORY: Social History   Socioeconomic History   Marital status: Single    Spouse name: Not on file   Number of children: Not on file   Years of education: Not on file   Highest education level: Not on file  Occupational History   Occupation: school bus driver  Tobacco Use   Smoking status: Never   Smokeless tobacco: Never  Vaping Use   Vaping status: Never Used  Substance and Sexual Activity   Alcohol  use: Not Currently   Drug use: No   Sexual activity: Not Currently    Birth control/protection: Surgical  Other Topics Concern   Not on file  Social History Narrative  Not on file   Social Drivers of Health   Financial Resource Strain: Not on file  Food Insecurity: No Food Insecurity (10/18/2021)   Hunger Vital Sign    Worried About Running Out of Food in the Last Year: Never true    Ran Out of Food in the Last Year: Never true  Transportation Needs: No Transportation Needs (10/18/2021)   PRAPARE - Administrator, Civil Service (Medical): No    Lack of Transportation (Non-Medical): No  Physical Activity: Not on file  Stress: No Stress Concern Present (01/10/2023)   Received from [facility] - Occupational Stress Questionnaire    Feeling of Stress : Not at all  Social Connections: Not on file  Intimate Partner Violence: Not At Risk (01/10/2023)   Received from [facility]   HITS    Over the last 12 months how often did your partner physically hurt you?: Never    Over the last 12 months how often did your partner insult  you or talk down to you?: Never    Over the last 12 months how often did your partner threaten you with physical harm?: Never    Over the last 12 months how often did your partner scream or curse at you?: Never    FAMILY HISTORY: Family History  Problem Relation Age of Onset   Prostate cancer Father    Congestive Heart Failure Mother    Diabetes Sister    Other Sister        septis   Breast cancer Neg Hx     ALLERGIES:  is allergic to other and sulfa antibiotics.  MEDICATIONS:  Current Outpatient Medications  Medication Sig Dispense Refill   acetaminophen  (TYLENOL ) 650 MG CR tablet Take 650 mg by mouth every 8 (eight) hours as needed for pain.     albuterol  (VENTOLIN  HFA) 108 (90 Base) MCG/ACT inhaler Inhale 2 puffs into the lungs every 6 (six) hours as needed for wheezing or shortness of breath.     atorvastatin  (LIPITOR ) 80 MG tablet TAKE 1 TABLET BY MOUTH EVERYDAY AT BEDTIME 90 tablet 3   B Complex-C (B-COMPLEX WITH VITAMIN C) tablet Take 1 tablet by mouth daily in the afternoon. Pt takes 100-1 mg daily     B Complex-C-Zn-Folic Acid  (DIALYVITE/ZINC) TABS Take 1 tablet by mouth every evening.     carvedilol  (COREG ) 25 MG tablet Take 25 mg by mouth 2 (two) times daily with a meal.     clopidogrel  (PLAVIX ) 75 MG tablet Take 1 tablet (75 mg total) by mouth daily. 90 tablet 3   fluticasone  (FLONASE ) 50 MCG/ACT nasal spray Place 1 spray into both nostrils daily as needed for allergies.     hydrALAZINE  (APRESOLINE ) 50 MG tablet TAKE 1 TABLET BY MOUTH TWICE A DAY 180 tablet 2   levocetirizine (XYZAL) 5 MG tablet Take 5 mg by mouth in the morning.     levothyroxine  (SYNTHROID ) 112 MCG tablet Take 112 mcg by mouth daily at 2 am.     LORazepam  (ATIVAN ) 1 MG tablet Take 1 mg by mouth daily as needed for anxiety.     Multiple Vitamins-Minerals (PRESERVISION AREDS 2 PO) Take 1 tablet by mouth in the morning.     nitroGLYCERIN  (NITROSTAT ) 0.4 MG SL tablet PLACE 1 TABLET (0.4 MG TOTAL) UNDER THE  TONGUE EVERY 5 (FIVE) MINUTES X 3 DOSES AS NEEDED FOR CHEST PAIN. 25 tablet 7   Omega-3 Fatty Acids (FISH  OIL) 1000 MG CAPS Take 1,000 mg by mouth at bedtime.     Polyethyl Glycol-Propyl Glycol (SYSTANE) 0.4-0.3 % SOLN Place 1 drop into both eyes in the morning, at noon, in the evening, and at bedtime.     polyethylene glycol (MIRALAX  / GLYCOLAX ) 17 g packet Take 17 g by mouth daily as needed for mild constipation or moderate constipation.     sevelamer carbonate (RENVELA) 800 MG tablet Take 1,600 mg by mouth 3 (three) times daily.     traZODone  (DESYREL ) 100 MG tablet Take 100 mg by mouth at bedtime.     allopurinol  (ZYLOPRIM ) 100 MG tablet Take 1 tablet (100 mg total) by mouth daily. (Patient taking differently: Take 100 mg by mouth at bedtime.) 30 tablet 0   Current Facility-Administered Medications  Medication Dose Route Frequency Provider Last Rate Last Admin   0.9 %  sodium chloride  infusion  250 mL Intravenous PRN Sheree Penne Bruckner, MD       sodium chloride  flush (NS) 0.9 % injection 3 mL  3 mL Intravenous Q12H Sheree Penne Bruckner, MD        REVIEW OF SYSTEMS:   Constitutional: Denies fevers, chills or abnormal night sweats (+) fatigue Eyes: Denies blurriness of vision, double vision or watery eyes Ears, nose, mouth, throat, and face: Denies mucositis or sore throat Respiratory: Denies cough or wheezes (+) DOE Cardiovascular: Denies palpitation, chest discomfort or lower extremity swelling (+) murmur Gastrointestinal:  Denies nausea, heartburn, hematochezia or change in bowel habits Skin: Denies abnormal skin rashes Lymphatics: Denies new lymphadenopathy or easy bruising Neurological:Denies numbness, tingling (+) generalized weaknesses Behavioral/Psych: Mood is stable, no new changes  All other systems were reviewed with the patient and are negative.  PHYSICAL EXAMINATION: ECOG PERFORMANCE STATUS: 1 - Symptomatic but completely ambulatory  Vitals:   02/17/23 1215   BP: (!) 164/71  Pulse: (!) 55  Resp: 18  Temp: 98 F (36.7 C)  SpO2: 100%   Filed Weights   02/17/23 1215  Weight: 227 lb 12.8 oz (103.3 kg)    GENERAL:alert, no distress and comfortable SKIN:  no petechiae or ecchymosis EYES:  sclera clear NECK: Without mass LYMPH:  no palpable cervical or supraclavicular lymphadenopathy LUNGS: clear with normal breathing effort HEART: regular rate & rhythm, murmur noted, no lower extremity edema ABDOMEN: abdomen soft, non-tender and normal bowel sounds Musculoskeletal:no cyanosis of digits and no clubbing  PSYCH: alert & oriented x 3 with fluent speech NEURO: no focal motor/sensory deficits  LABORATORY DATA:  I have reviewed the data as listed    Latest Ref Rng & Units 08/12/2022    4:08 PM 04/26/2022    7:57 AM 03/11/2022    9:46 AM  CBC  WBC 3.8 - 10.8 Thousand/uL 4.7     Hemoglobin 11.7 - 15.5 g/dL 9.4  87.0  87.0   Hematocrit 35.0 - 45.0 % 30.6  38.0  38.0   Platelets 140 - 400 Thousand/uL 96          Latest Ref Rng & Units 08/12/2022    4:08 PM 04/26/2022    7:57 AM 04/17/2022    3:18 PM  CMP  Glucose 65 - 99 mg/dL 765  836    BUN 7 - 25 mg/dL 52  41    Creatinine 9.39 - 1.00 mg/dL 3.92  4.79    Sodium 864 - 146 mmol/L 139  139    Potassium 3.5 - 5.3 mmol/L 5.1  4.2    Chloride 98 -  110 mmol/L 98  98    CO2 20 - 32 mmol/L 28     Calcium  8.6 - 10.4 mg/dL 9.5     Total Protein 6.1 - 8.1 g/dL 6.3   6.7   Total Bilirubin 0.2 - 1.2 mg/dL 0.4   0.3   Alkaline Phos 44 - 121 IU/L   199   AST 10 - 35 U/L 17   20   ALT 6 - 29 U/L 14   16      RADIOGRAPHIC STUDIES: I have personally reviewed the radiological images as listed and agreed with the findings in the report. No results found.  ASSESSMENT & PLAN: 73 yo female   Thrombocytopenia -We reviewed her medical record in detail with the patient -She developed mild intermittent thrombocytopenia in 2021 which has become persistent and worsened in the past year, most recently  70K -No h/o acute/chronic viral illness, bariatric surgery, alcohol  use, or cirrhosis -No bruising/bleeding -Previous labs showed elevated B12, normal folate, no evidence of iron deficiency, and non-reactive hep B and HIV -No meds on her list strongly contribute, she had low plt count before starting plavix  for MI in 2023 -Will update nutritional labs, immature plt count to r/o ITP, check Hep C, and MM/light chains -Obtain abd US  to evaluate liver and spleen -We discussed the possibility of primary bone marrow condition such as MDS, especially in her age and if no other identifiable cause. She is OK to tentatively schedule bone marrow biopsy if other work up is negative -Will call her with lab and US  results, then f/up after bone marrow biopsy -Pt seen with Dr. Lanny  Anemia 2/2 CKD/ESRD and chronic disease  -Since at least 2018, on dialysis since 10/2021 -Does not think she is getting ESA -Per Dr. Gearline   CAD, s/p MI -Currently on plavix , OK to continue as long as platelets > 50K -F/up cardiology    PLAN: -Medical record reviewed -Lab today and abd US  in the next week, will call pt with results -Tentative bone marrow biopsy end of this month, if work up is negative -F/up in 4-5 weeks to review bone marrow biopsy -Pt seen with Dr. Lanny   Orders Placed This Encounter  Procedures   US  Abdomen Complete    Standing Status:   Future    Expected Date:   02/24/2023    Expiration Date:   02/17/2024    Reason for Exam (SYMPTOM  OR DIAGNOSIS REQUIRED):   thrombocytopenia, evaluate liver and spleen    Preferred imaging location?:   [facility]   CBC with Differential (Cancer Center Only)    Standing Status:   Future    Number of Occurrences:   1    Expiration Date:   02/17/2024   Retic Panel    Standing Status:   Future    Number of Occurrences:   1    Expiration Date:   02/17/2024   Immature Platelet Fraction    Standing Status:   Future    Number of Occurrences:   1     Expiration Date:   02/17/2024   Kappa/lambda light chains    Standing Status:   Future    Number of Occurrences:   1    Expiration Date:   02/17/2024   Multiple Myeloma Panel (SPEP&IFE w/QIG)    Standing Status:   Future    Number of Occurrences:   1    Expiration Date:   02/17/2024   Vitamin B12  Standing Status:   Future    Number of Occurrences:   1    Expiration Date:   02/17/2024   Folate RBC    Standing Status:   Future    Number of Occurrences:   1    Expiration Date:   02/17/2024   Ferritin    Standing Status:   Future    Number of Occurrences:   1    Expiration Date:   02/17/2024   Iron and Iron Binding Capacity ([facility] only)    Standing Status:   Future    Number of Occurrences:   1    Expiration Date:   02/17/2024   Hepatitis C antibody    Standing Status:   Future    Number of Occurrences:   1    Expiration Date:   02/17/2024   WBC/PLT in Citrate     All questions were answered. The patient knows to call the clinic with any problems, questions or concerns.      Carlen Rebuck K Madeliene Tejera, NP 02/17/23   Addendum I have seen the patient, examined her. I agree with the assessment and and plan and have edited the notes.   73 year old female with past medical history of end-stage renal disease on dialysis, hypertension, CHF, CAD, OSA, diabetes, and chronic anemia, was referred for thrombocytopenia.  Her thrombocytopenia has been ongoing for over 2-3 years, slightly worse lately, no active bleeding.  She has been on Plavix  since her heart attack in 2022.,  No known history of hepatitis or liver disease.  Liver appears unremarkable on her last CT scan in May 2022.  Discussed common etiology of anemia and thrombocytopenia, she likely has anemia of chronic disease, thrombocytopenia could be ITP, will check immature platelet, reticulocyte count, iron study, multiple myeloma panel, serum light chain level, etc. today, and obtain abdominal ultrasound to evaluate the liver and spleen.  If  the above workup is negative, I recommend a bone marrow biopsy to rule out MDS and other primary bone marrow disease given her advanced age.  No clinical suspicion for malignancy.  All questions were answered, we will call her with lab results, and see her back after bone marrow biopsy.  I spent a total of 30 minutes for her visit today, more than 50% time on face-to-face counseling.  Onita Mattock MD 02/17/2023

## 2023-02-18 LAB — KAPPA/LAMBDA LIGHT CHAINS
Kappa free light chain: 114.3 mg/L — ABNORMAL HIGH (ref 3.3–19.4)
Kappa, lambda light chain ratio: 1 (ref 0.26–1.65)
Lambda free light chains: 114.6 mg/L — ABNORMAL HIGH (ref 5.7–26.3)

## 2023-02-19 ENCOUNTER — Telehealth: Payer: Self-pay | Admitting: Hematology

## 2023-02-19 LAB — FOLATE RBC
Folate, Hemolysate: >620 ng/mL
Folate, RBC: >1808 ng/mL (ref >498)
Hematocrit: 34.3 % (ref 34.0–46.6)

## 2023-02-19 NOTE — Telephone Encounter (Signed)
 Patient is aware of scheduled appointment times/dates

## 2023-02-20 ENCOUNTER — Ambulatory Visit (HOSPITAL_COMMUNITY)
Admission: RE | Admit: 2023-02-20 | Discharge: 2023-02-20 | Disposition: A | Discharge status: Home / Self Care | Payer: Medicare Other | Attending: Internal Medicine | Admitting: Internal Medicine

## 2023-02-20 ENCOUNTER — Other Ambulatory Visit: Payer: Self-pay

## 2023-02-20 ENCOUNTER — Encounter (HOSPITAL_COMMUNITY): Payer: Self-pay | Admitting: Internal Medicine

## 2023-02-20 ENCOUNTER — Other Ambulatory Visit: Payer: Self-pay | Admitting: *Deleted

## 2023-02-20 ENCOUNTER — Encounter (HOSPITAL_COMMUNITY)
Admission: RE | Disposition: A | Discharge status: Home / Self Care | Payer: Self-pay | Source: Home / Self Care | Attending: Internal Medicine

## 2023-02-20 DIAGNOSIS — T82858A Stenosis of vascular prosthetic devices, implants and grafts, initial encounter: Secondary | ICD-10-CM | POA: Insufficient documentation

## 2023-02-20 DIAGNOSIS — Z794 Long term (current) use of insulin: Secondary | ICD-10-CM | POA: Insufficient documentation

## 2023-02-20 DIAGNOSIS — I132 Hypertensive heart and chronic kidney disease with heart failure and with stage 5 chronic kidney disease, or end stage renal disease: Secondary | ICD-10-CM | POA: Diagnosis not present

## 2023-02-20 DIAGNOSIS — I509 Heart failure, unspecified: Secondary | ICD-10-CM | POA: Diagnosis not present

## 2023-02-20 DIAGNOSIS — E669 Obesity, unspecified: Secondary | ICD-10-CM | POA: Insufficient documentation

## 2023-02-20 DIAGNOSIS — I2510 Atherosclerotic heart disease of native coronary artery without angina pectoris: Secondary | ICD-10-CM | POA: Insufficient documentation

## 2023-02-20 DIAGNOSIS — E1122 Type 2 diabetes mellitus with diabetic chronic kidney disease: Secondary | ICD-10-CM | POA: Diagnosis not present

## 2023-02-20 DIAGNOSIS — G4733 Obstructive sleep apnea (adult) (pediatric): Secondary | ICD-10-CM | POA: Insufficient documentation

## 2023-02-20 DIAGNOSIS — E785 Hyperlipidemia, unspecified: Secondary | ICD-10-CM | POA: Insufficient documentation

## 2023-02-20 DIAGNOSIS — Z955 Presence of coronary angioplasty implant and graft: Secondary | ICD-10-CM | POA: Insufficient documentation

## 2023-02-20 DIAGNOSIS — N186 End stage renal disease: Secondary | ICD-10-CM | POA: Diagnosis not present

## 2023-02-20 DIAGNOSIS — Z992 Dependence on renal dialysis: Secondary | ICD-10-CM | POA: Diagnosis not present

## 2023-02-20 DIAGNOSIS — Z6832 Body mass index (BMI) 32.0-32.9, adult: Secondary | ICD-10-CM | POA: Diagnosis not present

## 2023-02-20 DIAGNOSIS — Y832 Surgical operation with anastomosis, bypass or graft as the cause of abnormal reaction of the patient, or of later complication, without mention of misadventure at the time of the procedure: Secondary | ICD-10-CM | POA: Diagnosis not present

## 2023-02-20 DIAGNOSIS — D696 Thrombocytopenia, unspecified: Secondary | ICD-10-CM

## 2023-02-20 HISTORY — PX: A/V FISTULAGRAM: CATH118298

## 2023-02-20 HISTORY — PX: PERIPHERAL VASCULAR INTERVENTION: CATH118257

## 2023-02-20 SURGERY — A/V FISTULAGRAM
Anesthesia: LOCAL

## 2023-02-20 MED ORDER — SODIUM CHLORIDE 0.9 % IV SOLN: INTRAVENOUS | Status: DC

## 2023-02-20 MED ORDER — FENTANYL CITRATE (PF) 100 MCG/2ML IJ SOLN
INTRAMUSCULAR | Status: AC
  Filled 2023-02-20: qty 2

## 2023-02-20 MED ORDER — MIDAZOLAM HCL 2 MG/2ML IJ SOLN
INTRAMUSCULAR | Status: DC | PRN
  Administered 2023-02-20: 1 mg via INTRAVENOUS

## 2023-02-20 MED ORDER — ONDANSETRON HCL 4 MG/2ML IJ SOLN: 4.0000 mg | Freq: Four times a day (QID) | INTRAMUSCULAR | Status: DC | PRN

## 2023-02-20 MED ORDER — LIDOCAINE HCL (PF) 1 % IJ SOLN
INTRAMUSCULAR | Status: AC
  Filled 2023-02-20: qty 30

## 2023-02-20 MED ORDER — MIDAZOLAM HCL 2 MG/2ML IJ SOLN
INTRAMUSCULAR | Status: AC
  Filled 2023-02-20: qty 2

## 2023-02-20 MED ORDER — SODIUM CHLORIDE 0.9% FLUSH: 3.0000 mL | INTRAVENOUS | Status: DC | PRN

## 2023-02-20 MED ORDER — ACETAMINOPHEN 325 MG PO TABS
ORAL_TABLET | ORAL | Status: AC
  Filled 2023-02-20: qty 2

## 2023-02-20 MED ORDER — HEPARIN (PORCINE) IN NACL 1000-0.9 UT/500ML-% IV SOLN
INTRAVENOUS | Status: DC | PRN
  Administered 2023-02-20: 500 mL

## 2023-02-20 MED ORDER — LIDOCAINE HCL (PF) 1 % IJ SOLN
INTRAMUSCULAR | Status: DC | PRN
  Administered 2023-02-20: 2 mL via INTRADERMAL

## 2023-02-20 MED ORDER — FENTANYL CITRATE (PF) 100 MCG/2ML IJ SOLN
INTRAMUSCULAR | Status: DC | PRN
  Administered 2023-02-20: 25 ug via INTRAVENOUS

## 2023-02-20 MED ORDER — ACETAMINOPHEN 325 MG PO TABS
650.0000 mg | ORAL_TABLET | ORAL | Status: DC | PRN
  Administered 2023-02-20: 650 mg via ORAL

## 2023-02-20 MED ORDER — IODIXANOL 320 MG/ML IV SOLN
INTRAVENOUS | Status: DC | PRN
  Administered 2023-02-20: 25 mL via INTRAVENOUS

## 2023-02-20 SURGICAL SUPPLY — 10 items
BALLN ATHLETIS 8X80X75 (BALLOONS) ×2
BALLOON ATHLETIS 8X80X75 (BALLOONS) IMPLANT
COVER DOME SNAP 22 D (MISCELLANEOUS) ×2 IMPLANT
GUIDEWIRE ANGLED .035X150CM (WIRE) IMPLANT
KIT MICROPUNCTURE NIT STIFF (SHEATH) IMPLANT
SHEATH PINNACLE 8F 10CM (SHEATH) IMPLANT
SHEATH PINNACLE R/O II 6F 4CM (SHEATH) IMPLANT
STENT VIABAHN 8X100X75 (Permanent Stent) IMPLANT
SYR MEDALLION 10ML (SYRINGE) IMPLANT
TRAY PV CATH (CUSTOM PROCEDURE TRAY) ×2 IMPLANT

## 2023-02-20 NOTE — Op Note (Signed)
Patient presents for concerns of decreased access flows of her left BB AVG (BC AVF created 03/2022 converted to cephalic vein to axillary vein AVG 04/2022 + viabahn of outflow).  She reports PTA at [facility] <2 months ago.  On exam her AVG has a poor bruit and thrill.   Summary:  1)      The patient had successful angioplasty (8 mm Athletis FE ~18 atm) of significant stenosis in the VA and intragraft stenoses. Required a 8x100 Viabahn stent to treat active extravascation.  2)      Flows improved after outflow angioplasty. 3)      This left AVG  remains amenable to future percutaneous intervention as long as it remains patent at least 1 month.   Description of procedure: The arm was prepped and draped in the usual sterile fashion. The left upper arm brachial cephalic fistula/hybrid graft was cannulated (03474) with a 21g Angiocath needle directed in an antegrade direction in arterial limb of the fistula. A guidewire was inserted and exchanged for a 6 Fr and then later 8 Fr sheath. Contrast [phone]) injection via the side port of the sheath was performed. The angiogram of the access (38756) showed a focal 60% proximal edge of stent stenosis and 80% intragraft stenosis x  2 in the cannulation zone. The centrals and inflow anastomosis were patent but flows were sluggish through the circuit.   A 0.035 hydrophilic wire was then inserted through the sheath and parked in the central veins. A 8x8 mm Athletis  angioplasty balloon was then inserted over the guidewire and positioned at the intrastent stenosis (proximal edge). Venous angioplasty (43329) was carried out to 18 ATM with FULL effacement of the waist on the balloon at the lesion.  Next the balloon was retracted to the site of the intragraft stenosis and venous angioplasty was completed to full effacement at 20 ATM.  The repeat angiogram showed active extravascation at the site of angioplasty and the balloon was reinflated to tamponade for 3 minutes.  Despite  prolonged inflation there was ongoing extravascation.   A decision was made to repair the ruptured graft with a stent graft because of ongoing extravasation. An 8x100 Viabahn stent graft was deployed in the body of the graft overlapping slightly with the previously placed outflow stent.  I followed this with a 8 mm balloon angioplasty of the stent graft.  No ongoing extravascation was noted but flows were sluggish.  Thrombus was noted in the more proximal stent and I used the 8mm balloon to macerate the thrombus and angioplasty what appeared to be a potential flow limiting dissection in previously placed stent.  After polishing the circuit flows were improved and an audible bruit was present.    Retrograde evaluation of the inflow resulted in a hematoma at the site of the arterial sheath.  I suspect this was due to the exchange from the 8Fr to 6Fr sheath in combination with the increased vascular pressure with retrograde technique.   Prolonged digital pressure was held at the site after shealth removal and a stable hematoma was observed.   Given multi level issues with some thrombus noted during the procedure I elected to hold the patient in the bay following the procedure to ensure patency.  The bruit and thrill improved in the post op area and she had no significant pain at the sites of extravasation.    Hemostasis: A 3-0 ethilon purse string suture was placed at the cannulation site on removal of the sheath.  Sedation: 1mg  Versed, Fentanyl.  Contrast. 10 mL  Monitoring: Because of the patient's comorbid conditions and sedation during the procedure, continuous EKG monitoring and O2 saturation monitoring was performed throughout the procedure by the RN. There were no abnormal arrhythmias encountered.  Complications: type 2 grade 2 (rupture of AVG requiring stent graft placement), type 1 grade 1 (access site hematoma treated with manual compression)   Diagnoses: I87.1 Stricture of vein   N18.6 ESRD T82.858A Stricture of access  Procedure Coding:  [phone] Cannulation and angiogram of fistula, venous angioplasty (cephalic vein arch) with stent graft deployment [id number] Contrast  Recommendations:  1. Continue to cannulate the graft with 15G needles.  2. Refer for problems with flows/swelling.  If the arm is too bruised up or painful at treatment tomorrow to use will need HD catheter placement.  3. Remove the suture next treatment.  4. Remains amenable to future percutaneous intervention as long as it remains patent at least 1 month.   Discharge: The patient was discharged home in stable condition. The patient was given education regarding the care of the dialysis access and specific instructions in case of any problems.

## 2023-02-20 NOTE — H&P (Signed)
[facility]  Vascular Access H&P  Reason for Consultation: low access flows Requesting Provider: Dr. Signe Colt  HPI: Tiffany [name] is an 73 y.o. female with ESRD on HD, CASD h/o PCI, HTN, HL, obesity, h/o OSA not on CPAP, DM who presents for low access flows.    Her L BC AVF was created in 03/2022 but due to poor maturation ultimately went revision in 06/2022 with interposition AVG (7mm) from cephalic vein to axillary vein.  She last had outflow angioplasty at [facility] in 12/2022.   She tolerated the procedure well.  She notes no particular issues with her dialysis of late.    PMH: Past Medical History:  Diagnosis Date   ALLERGIC RHINITIS    Anxiety    Arthritis    CHF (congestive heart failure) (HCC)    CKD (chronic kidney disease)    Dialysis T/Th/Sa   Collagen vascular disease (HCC)    Coronary artery disease    05/12/21 - cath/ stent placement   COVID 01/21/2021   + HOME TEST  HAD COUGH CONGESTION AND MALAISE, OCC CONGESTION NOW   COVID 2022   mild   Heart murmur    at birth   Hyperlipidemia    Hypertension    Hypothyroid    Iron deficiency anemia    Myocardial infarction (HCC) 05/2021   Obesity    OSA on CPAP    no longer on a cpap   Refusal of blood transfusions as patient is Jehovah's Witness    Shingles 11/2020   LEFT BACK   Type 2 dm with insulin use    checks abg qday runs 125   Wears glasses    PSH: Past Surgical History:  Procedure Laterality Date   A/V FISTULAGRAM Left 03/11/2022   Procedure: A/V Fistulagram;  Surgeon: Maeola Harman, MD;  Location: [facility] INVASIVE CV LAB;  Service: Cardiovascular;  Laterality: Left;   AV FISTULA PLACEMENT Left 01/18/2022   Procedure: LEFT ARM ARTERIOVENOUS (AV) FISTULA;  Surgeon: Maeola Harman, MD;  Location: [facility] OR;  Service: Vascular;  Laterality: Left;   BREAST BIOPSY     left breast per pt; benign 20+ yrs ago   BREAST REDUCTION SURGERY  1982   COLONOSCOPY WITH PROPOFOL N/A 10/24/2020    Procedure: COLONOSCOPY WITH PROPOFOL;  Surgeon: Kerin Salen, MD;  Location: [facility] ENDOSCOPY;  Service: Gastroenterology;  Laterality: N/A;   CORONARY STENT INTERVENTION N/A 05/12/2021   Procedure: CORONARY STENT INTERVENTION;  Surgeon: Orbie Pyo, MD;  Location: [facility] INVASIVE CV LAB;  Service: Cardiovascular;  Laterality: N/A;   CORONARY/GRAFT ACUTE MI REVASCULARIZATION N/A 05/12/2021   Procedure: Coronary/Graft Acute MI Revascularization;  Surgeon: Orbie Pyo, MD;  Location: [facility] INVASIVE CV LAB;  Service: Cardiovascular;  Laterality: N/A;   FISTULA SUPERFICIALIZATION Left 04/26/2022   Procedure: LEFT UPPER ARM BRACHIAL CEPHALIC FISTULA LIGATION W/ ATTEMPTED CEPHALIC VEIN STENTING;  Surgeon: Maeola Harman, MD;  Location: [facility] OR;  Service: Vascular;  Laterality: Left;   GRAFT APPLICATION Left 04/26/2022   Procedure: LEFT UPPER ARM GORE-TEX GRAFT APPLICATION;  Surgeon: Maeola Harman, MD;  Location: [facility] OR;  Service: Vascular;  Laterality: Left;   IR FLUORO GUIDE CV LINE RIGHT  10/18/2021   IR US GUIDE VASC ACCESS RIGHT  10/18/2021   KNEE SURGERY Bilateral    x 2   LEFT HEART CATH AND CORONARY ANGIOGRAPHY N/A 05/12/2021   Procedure: LEFT HEART CATH AND CORONARY ANGIOGRAPHY;  Surgeon: Orbie Pyo, MD;  Location: [facility] INVASIVE  CV LAB;  Service: Cardiovascular;  Laterality: N/A;   LUMBAR DISC SURGERY     x 2   PERIPHERAL VASCULAR BALLOON ANGIOPLASTY  03/11/2022   Procedure: PERIPHERAL VASCULAR BALLOON ANGIOPLASTY;  Surgeon: Maeola Harman, MD;  Location: [facility] INVASIVE CV LAB;  Service: Cardiovascular;;   POLYPECTOMY  10/24/2020   Procedure: POLYPECTOMY;  Surgeon: Kerin Salen, MD;  Location: [facility];  Service: Gastroenterology;;   REDUCTION MAMMAPLASTY Bilateral 1985   THROMBECTOMY BRACHIAL ARTERY  01/18/2022   Procedure: THROMBECTOMY TO LEFT CEPHALIC VEIN;  Surgeon: Maeola Harman, MD;  Location: [facility] OR;  Service: Vascular;;   TOE SURGERY      yrs ago per pt on 02-02-2021   TOTAL ABDOMINAL HYSTERECTOMY  1997   partial hysterectomy- still has ovaries   TOTAL KNEE ARTHROPLASTY Left 07/01/2019   Procedure: TOTAL KNEE ARTHROPLASTY;  Surgeon: Durene Romans, MD;  Location: [facility] ORS;  Service: Orthopedics;  Laterality: Left;  70 min NO BLOOD PRODUCTS!!   TREATMENT FISTULA ANAL     yrs ago per pt 0n 02-02-2021   TRIGGER FINGER RELEASE Left 02/08/2021   Procedure: Left Ring trigger finger release, left ring finger volar ganglion cyst excision;  Surgeon: Gomez Cleverly, MD;  Location: [facility];  Service: Orthopedics;  Laterality: Left;  with local anesthesia    Past Medical History:  Diagnosis Date   ALLERGIC RHINITIS    Anxiety    Arthritis    CHF (congestive heart failure) (HCC)    CKD (chronic kidney disease)    Dialysis T/Th/Sa   Collagen vascular disease (HCC)    Coronary artery disease    05/12/21 - cath/ stent placement   COVID 01/21/2021   + HOME TEST  HAD COUGH CONGESTION AND MALAISE, OCC CONGESTION NOW   COVID 2022   mild   Heart murmur    at birth   Hyperlipidemia    Hypertension    Hypothyroid    Iron deficiency anemia    Myocardial infarction (HCC) 05/2021   Obesity    OSA on CPAP    no longer on a cpap   Refusal of blood transfusions as patient is Jehovah's Witness    Shingles 11/2020   LEFT BACK   Type 2 dm with insulin use    checks abg qday runs 125   Wears glasses     Medications:  I have reviewed the patient's current medications.  Facility-Administered Medications Prior to Admission  Medication Dose Route Frequency Provider Last Rate Last Admin   0.9 %  sodium chloride infusion  250 mL Intravenous PRN Maeola Harman, MD       sodium chloride flush (NS) 0.9 % injection 3 mL  3 mL Intravenous Q12H Maeola Harman, MD       Medications Prior to Admission  Medication Sig Dispense Refill   acetaminophen (TYLENOL) 650 MG CR tablet Take 650 mg by mouth every 8  (eight) hours as needed for pain.     allopurinol (ZYLOPRIM) 100 MG tablet Take 1 tablet (100 mg total) by mouth daily. (Patient taking differently: Take 100 mg by mouth at bedtime.) 30 tablet 0   atorvastatin (LIPITOR) 80 MG tablet TAKE 1 TABLET BY MOUTH EVERYDAY AT BEDTIME 90 tablet 3   B Complex-C-Zn-Folic Acid (DIALYVITE/ZINC) TABS Take 1 tablet by mouth every evening.     carvedilol (COREG) 25 MG tablet Take 25 mg by mouth 2 (two) times daily with a meal.     clopidogrel (PLAVIX) 75 MG  tablet Take 1 tablet (75 mg total) by mouth daily. 90 tablet 3   fluticasone (FLONASE) 50 MCG/ACT nasal spray Place 1 spray into both nostrils daily as needed for allergies.     hydrALAZINE (APRESOLINE) 50 MG tablet TAKE 1 TABLET BY MOUTH TWICE A DAY 180 tablet 2   insulin glargine (LANTUS) 100 UNIT/ML injection Inject 10 Units into the skin daily.     levocetirizine (XYZAL) 5 MG tablet Take 5 mg by mouth in the morning.     levothyroxine (SYNTHROID) 112 MCG tablet Take 224 mcg by mouth daily at 2 am.     LORazepam (ATIVAN) 1 MG tablet Take 1 mg by mouth daily as needed for anxiety.     Multiple Vitamins-Minerals (PRESERVISION AREDS 2 PO) Take 1 tablet by mouth in the morning.     Omega-3 Fatty Acids (FISH OIL) 1200 MG CAPS Take 1,200 mg by mouth at bedtime.     Polyethyl Glycol-Propyl Glycol (SYSTANE) 0.4-0.3 % SOLN Place 1 drop into both eyes in the morning, at noon, in the evening, and at bedtime.     polyethylene glycol (MIRALAX / GLYCOLAX) 17 g packet Take 17 g by mouth daily as needed for mild constipation or moderate constipation.     sevelamer carbonate (RENVELA) 800 MG tablet Take 2,400 mg by mouth 3 (three) times daily with meals.     traZODone (DESYREL) 100 MG tablet Take 100 mg by mouth at bedtime.     albuterol (VENTOLIN HFA) 108 (90 Base) MCG/ACT inhaler Inhale 2 puffs into the lungs every 6 (six) hours as needed for wheezing or shortness of breath.     nitroGLYCERIN (NITROSTAT) 0.4 MG SL tablet  PLACE 1 TABLET (0.4 MG TOTAL) UNDER THE TONGUE EVERY 5 (FIVE) MINUTES X 3 DOSES AS NEEDED FOR CHEST PAIN. 25 tablet 7    ALLERGIES:   Allergies  Allergen Reactions   Other     No blood products   Sulfa Antibiotics Rash    FAM HX: Family History  Problem Relation Age of Onset   Prostate cancer Father    Congestive Heart Failure Mother    Diabetes Sister    Other Sister        septis   Breast cancer Neg Hx     Social History:   reports that she has never smoked. She has never used smokeless tobacco. She reports that she does not currently use alcohol. She reports that she does not use drugs.  ROS: 12 system ROS neg except per HPI  There were no vitals taken for this visit. PHYSICAL EXAM: Gen:  well appearing  Eyes: glasses ENT: class 3 airway Neck: supple CV: RRR Back: sats normal on RA, clear ant Extr:  LUE AVF pulsatile with weak bruit Neuro: nonfocal Skin:   No results found for this or any previous visit (from the past 48 hours).  No results found.  Assessment/PlanCorliss Gordon [name] is an 73 y.o. female with ESRD on HD, CASD h/o PCI, HTN, HL, obesity, h/o OSA not on CPAP, DM who presents for low access flows.    **ESRD with AV access dysfunction:  physical exam suggests recurrent outflow stenosis.  She is informed of risk, benefits, alternatives to angiogram/plasty today and agreeable to proceed.  She will have conscious sedation administered with routine monitoring throughout.  All questions answered.   Tyler Pita 02/20/2023, 7:12 AM

## 2023-02-20 NOTE — Discharge Instructions (Signed)

## 2023-02-24 LAB — MULTIPLE MYELOMA PANEL, SERUM
Albumin SerPl Elph-Mcnc: 3.8 g/dL (ref 2.9–4.4)
Albumin/Glob SerPl: 1.6 (ref 0.7–1.7)
Alpha 1: 0.2 g/dL (ref 0.0–0.4)
Alpha2 Glob SerPl Elph-Mcnc: 0.6 g/dL (ref 0.4–1.0)
B-Globulin SerPl Elph-Mcnc: 0.7 g/dL (ref 0.7–1.3)
Gamma Glob SerPl Elph-Mcnc: 1 g/dL (ref 0.4–1.8)
Globulin, Total: 2.5 g/dL (ref 2.2–3.9)
IgA: 208 mg/dL (ref 64–422)
IgG (Immunoglobin G), Serum: 1107 mg/dL (ref 586–1602)
IgM (Immunoglobulin M), Srm: 123 mg/dL (ref 26–217)
Total Protein ELP: 6.3 g/dL (ref 6.0–8.5)

## 2023-03-03 ENCOUNTER — Ambulatory Visit (HOSPITAL_COMMUNITY): Payer: Medicare Other

## 2023-03-03 ENCOUNTER — Ambulatory Visit (HOSPITAL_COMMUNITY)
Admission: RE | Admit: 2023-03-03 | Discharge: 2023-03-03 | Disposition: A | Discharge status: Home / Self Care | Payer: Medicare Other | Source: Ambulatory Visit | Attending: Nurse Practitioner | Admitting: Nurse Practitioner

## 2023-03-03 DIAGNOSIS — D696 Thrombocytopenia, unspecified: Secondary | ICD-10-CM | POA: Insufficient documentation

## 2023-03-04 ENCOUNTER — Telehealth: Payer: Self-pay | Admitting: *Deleted

## 2023-03-04 NOTE — Telephone Encounter (Signed)
-----   Message from Pollyann Samples sent at 03/04/2023  1:57 PM EST ----- Abdominal US shows likely fatty liver, no clear explanation for lab findings. The recommendation is to proceed with bone marrow biopsy scheduled 1/30.   Thanks Lacie NP

## 2023-03-04 NOTE — Telephone Encounter (Signed)
Per Santiago Glad, NP, called pt with message below. Pt verbalized understanding.  ?

## 2023-03-05 DIAGNOSIS — D696 Thrombocytopenia, unspecified: Secondary | ICD-10-CM | POA: Insufficient documentation

## 2023-03-05 NOTE — Progress Notes (Unsigned)
INDICATION: thrombocytopenia  Brief examination was performed. ENT: adequate airway clearance Heart: regular rate and rhythm.No Murmurs Lungs: clear to auscultation, no wheezes, normal respiratory effort  Bone Marrow Biopsy and Aspiration Procedure Note   Informed consent was obtained and potential risks including bleeding, infection and pain were reviewed with the patient.  The patient's name, date of birth, identification, consent and allergies were verified prior to the start of procedure and time out was performed.  The {left/right:311354} posterior iliac crest was chosen as the site of biopsy.  The skin was prepped with ChloraPrep.   8 cc of 1% lidocaine was used to provide local anaesthesia.   10 cc of bone marrow aspirate was obtained followed by 1cm biopsy.  Pressure was applied to the biopsy site and bandage was placed over the biopsy site. Patient was made to lie on the back for 15 mins prior to discharge.  The procedure was tolerated well. COMPLICATIONS: None BLOOD LOSS: none The patient was discharged home in stable condition with a 1 week follow up to review results.  Patient was provided with post bone marrow biopsy instructions and instructed to call if there was any bleeding or worsening pain.  Specimens sent for flow cytometry, cytogenetics and additional studies.  Signed Noreene Filbert, NP

## 2023-03-06 ENCOUNTER — Inpatient Hospital Stay: Payer: Medicare Other

## 2023-03-06 ENCOUNTER — Encounter (HOSPITAL_COMMUNITY): Payer: Self-pay

## 2023-03-06 VITALS — BP 165/81 | HR 57 | Temp 97.8°F | Resp 18

## 2023-03-06 DIAGNOSIS — D696 Thrombocytopenia, unspecified: Secondary | ICD-10-CM | POA: Diagnosis not present

## 2023-03-06 LAB — CBC WITH DIFFERENTIAL (CANCER CENTER ONLY)
Abs Immature Granulocytes: 0.02 10*3/uL (ref 0.00–0.07)
Basophils Absolute: 0 10*3/uL (ref 0.0–0.1)
Basophils Relative: 0 %
Eosinophils Absolute: 0.2 10*3/uL (ref 0.0–0.5)
Eosinophils Relative: 3 %
HCT: 33.2 % — ABNORMAL LOW (ref 36.0–46.0)
Hemoglobin: 10.7 g/dL — ABNORMAL LOW (ref 12.0–15.0)
Immature Granulocytes: 0 %
Lymphocytes Relative: 24 %
Lymphs Abs: 1.1 10*3/uL (ref 0.7–4.0)
MCH: 29.1 pg (ref 26.0–34.0)
MCHC: 32.2 g/dL (ref 30.0–36.0)
MCV: 90.2 fL (ref 80.0–100.0)
Monocytes Absolute: 0.7 10*3/uL (ref 0.1–1.0)
Monocytes Relative: 14 %
Neutro Abs: 2.7 10*3/uL (ref 1.7–7.7)
Neutrophils Relative %: 59 %
Platelet Count: 101 10*3/uL — ABNORMAL LOW (ref 150–400)
RBC: 3.68 MIL/uL — ABNORMAL LOW (ref 3.87–5.11)
RDW: 15.4 % (ref 11.5–15.5)
WBC Count: 4.6 10*3/uL (ref 4.0–10.5)
nRBC: 0 % (ref 0.0–0.2)

## 2023-03-06 MED ORDER — LIDOCAINE HCL 2 % IJ SOLN
20.0000 mL | Freq: Once | INTRAMUSCULAR | Status: AC
  Administered 2023-03-06: 400 mg

## 2023-03-06 NOTE — Progress Notes (Signed)
Pt observed for 30 minutes post bone marrow biopsy. VSS. Biopsy site intact, clean, and dry at time of discharge.

## 2023-03-06 NOTE — Patient Instructions (Signed)
Bone Marrow Aspiration and Bone Marrow Biopsy, Adult, Care After The following information offers guidance on how to care for yourself after your procedure. Your health care provider may also give you more specific instructions. If you have problems or questions, contact your health care provider. What can I expect after the procedure? After the procedure, it is common to have: Mild pain and tenderness. Swelling. Bruising. Follow these instructions at home: Incision care  Follow instructions from your health care provider about how to take care of the incision site. Make sure you: Wash your hands with soap and water for at least 20 seconds before and after you change your bandage (dressing). If soap and water are not available, use hand sanitizer. Change your dressing as told by your health care provider. Leave stitches (sutures), skin glue, or adhesive strips in place. These skin closures may need to stay in place for 2 weeks or longer. If adhesive strip edges start to loosen and curl up, you may trim the loose edges. Do not remove adhesive strips completely unless your health care provider tells you to do that. Check your incision site every day for signs of infection. Check for: More redness, swelling, or pain. Fluid or blood. Warmth. Pus or a bad smell. Activity Return to your normal activities as told by your health care provider. Ask your health care provider what activities are safe for you. Do not lift anything that is heavier than 10 lb (4.5 kg), or the limit that you are told, until your health care provider says that it is safe. If you were given a sedative during the procedure, it can affect you for several hours. Do not drive or operate machinery until your health care provider says that it is safe. General instructions  Take over-the-counter and prescription medicines only as told by your health care provider. Do not take baths, swim, or use a hot tub until your health care  provider approves. Ask your health care provider if you may take showers. You may only be allowed to take sponge baths. If directed, put ice on the affected area. To do this: Put ice in a plastic bag. Place a towel between your skin and the bag. Leave the ice on for 20 minutes, 2-3 times a day. If your skin turns bright red, remove the ice right away to prevent skin damage. The risk of skin damage is higher if you cannot feel pain, heat, or cold. Contact a health care provider if: You have signs of infection. Your pain is not controlled with medicine. You have cancer, and a temperature of 100.62F (38C) or higher. Get help right away if: You have a temperature of 101F (38.3C) or higher, or as told by your health care provider. You have bleeding from the incision site that cannot be controlled. This information is not intended to replace advice given to you by your health care provider. Make sure you discuss any questions you have with your health care provider. Document Revised: 05/28/2021 Document Reviewed: 05/28/2021 Elsevier Patient Education  2024 [employer].

## 2023-03-07 LAB — SURGICAL PATHOLOGY

## 2023-03-13 ENCOUNTER — Inpatient Hospital Stay: Payer: Medicare Other | Attending: Nurse Practitioner | Admitting: Nurse Practitioner

## 2023-03-13 ENCOUNTER — Encounter (HOSPITAL_COMMUNITY): Payer: Self-pay | Admitting: Hematology

## 2023-03-13 ENCOUNTER — Encounter: Payer: Self-pay | Admitting: Nurse Practitioner

## 2023-03-13 DIAGNOSIS — D696 Thrombocytopenia, unspecified: Secondary | ICD-10-CM

## 2023-03-13 NOTE — Progress Notes (Signed)
 Patient Care Team: Tiffany Atlas, MD as PCP - General (Internal Medicine) Tiffany [name] ORN, MD (Inactive) as PCP - Cardiology (Cardiology) Center, [facility]   I connected with Tiffany [name] on 03/13/23 at 11:00 AM EST by telephone visit and verified that I am speaking with the correct person using two identifiers.   I discussed the limitations, risks, security and privacy concerns of performing an evaluation and management service by telemedicine and the availability of in-person appointments. I also discussed with the patient that there may be a patient responsible charge related to this service. The patient expressed understanding and agreed to proceed.   Other persons participating in the visit and their role in the encounter: None   Patient's location: Dialysis  Provider's location: [facility] Office    Chief Complaint: Follow up bone marrow biopsy results   CURRENT THERAPY: Observation  INTERVAL HISTORY Tiffany [name] presents for phone follow-up as scheduled, currently at dialysis.  Doing well overall with no significant changes.  Denies bleeding, pain, or any other new or specific complaints.  Notes she does not receive blood products as she is a [employer]  ROS  All other systems reviewed and negative  Past Medical History:  Diagnosis Date   ALLERGIC RHINITIS    Anxiety    Arthritis    CHF (congestive heart failure) (HCC)    CKD (chronic kidney disease)    Dialysis T/Th/Sa   Collagen vascular disease (HCC)    Coronary artery disease    05/12/21 - cath/ stent placement   COVID 01/21/2021   + HOME TEST  HAD [name] CONGESTION AND MALAISE, OCC CONGESTION NOW   COVID 2022   mild   Heart murmur    at birth   Hyperlipidemia    Hypertension    Hypothyroid    Iron deficiency anemia    Myocardial infarction (HCC) 05/2021   Obesity    OSA on CPAP    no longer on a cpap   Refusal of blood transfusions as patient is Jehovah's Witness    Shingles 11/2020    LEFT BACK   Type 2 dm with insulin  use    checks abg qday runs 125   Wears glasses      Past Surgical History:  Procedure Laterality Date   A/V FISTULAGRAM Left 03/11/2022   Procedure: A/V Fistulagram;  Surgeon: Tiffany Penne Bruckner, MD;  Location: [facility] INVASIVE CV LAB;  Service: Cardiovascular;  Laterality: Left;   A/V FISTULAGRAM N/A 02/20/2023   Procedure: A/V Fistulagram;  Surgeon: Tiffany Manuelita LABOR, MD;  Location: [facility] INVASIVE CV LAB;  Service: Cardiovascular;  Laterality: N/A;   AV FISTULA PLACEMENT Left 01/18/2022   Procedure: LEFT ARM ARTERIOVENOUS (AV) FISTULA;  Surgeon: Tiffany Penne Bruckner, MD;  Location: [facility] OR;  Service: Vascular;  Laterality: Left;   BREAST BIOPSY     left breast per pt; benign 20+ yrs ago   BREAST REDUCTION SURGERY  1982   COLONOSCOPY WITH PROPOFOL  N/A 10/24/2020   Procedure: COLONOSCOPY WITH PROPOFOL ;  Surgeon: Tiffany Jasper, MD;  Location: [facility] ENDOSCOPY;  Service: Gastroenterology;  Laterality: N/A;   CORONARY STENT INTERVENTION N/A 05/12/2021   Procedure: CORONARY STENT INTERVENTION;  Surgeon: Tiffany Lurena POUR, MD;  Location: [facility] INVASIVE CV LAB;  Service: Cardiovascular;  Laterality: N/A;   CORONARY/GRAFT ACUTE MI REVASCULARIZATION N/A 05/12/2021   Procedure: Coronary/Graft Acute MI Revascularization;  Surgeon: Tiffany Lurena POUR, MD;  Location: [facility] INVASIVE CV LAB;  Service: Cardiovascular;  Laterality: N/A;  FISTULA SUPERFICIALIZATION Left 04/26/2022   Procedure: LEFT UPPER ARM BRACHIAL CEPHALIC FISTULA LIGATION W/ ATTEMPTED CEPHALIC VEIN STENTING;  Surgeon: Tiffany Penne Bruckner, MD;  Location: [facility] OR;  Service: Vascular;  Laterality: Left;   GRAFT APPLICATION Left 04/26/2022   Procedure: LEFT UPPER ARM GORE-TEX GRAFT APPLICATION;  Surgeon: Tiffany Penne Bruckner, MD;  Location: [facility] OR;  Service: Vascular;  Laterality: Left;   IR FLUORO GUIDE CV LINE RIGHT  10/18/2021   IR US  GUIDE VASC ACCESS RIGHT  10/18/2021   KNEE SURGERY Bilateral    x 2    LEFT HEART CATH AND CORONARY ANGIOGRAPHY N/A 05/12/2021   Procedure: LEFT HEART CATH AND CORONARY ANGIOGRAPHY;  Surgeon: Tiffany Lurena POUR, MD;  Location: [facility] INVASIVE CV LAB;  Service: Cardiovascular;  Laterality: N/A;   LUMBAR DISC SURGERY     x 2   PERIPHERAL VASCULAR BALLOON ANGIOPLASTY  03/11/2022   Procedure: PERIPHERAL VASCULAR BALLOON ANGIOPLASTY;  Surgeon: Tiffany Penne Bruckner, MD;  Location: [facility] INVASIVE CV LAB;  Service: Cardiovascular;;   PERIPHERAL VASCULAR INTERVENTION Left 02/20/2023   Procedure: PERIPHERAL VASCULAR INTERVENTION;  Surgeon: Tiffany Manuelita LABOR, MD;  Location: [facility] INVASIVE CV LAB;  Service: Cardiovascular;  Laterality: Left;  va stent   POLYPECTOMY  10/24/2020   Procedure: POLYPECTOMY;  Surgeon: Tiffany Jasper, MD;  Location: [facility];  Service: Gastroenterology;;   REDUCTION MAMMAPLASTY Bilateral 1985   THROMBECTOMY BRACHIAL ARTERY  01/18/2022   Procedure: THROMBECTOMY TO LEFT CEPHALIC VEIN;  Surgeon: Tiffany Penne Bruckner, MD;  Location: [facility] OR;  Service: Vascular;;   TOE SURGERY     yrs ago per pt on 02-02-2021   TOTAL ABDOMINAL HYSTERECTOMY  1997   partial hysterectomy- still has ovaries   TOTAL KNEE ARTHROPLASTY Left 07/01/2019   Procedure: TOTAL KNEE ARTHROPLASTY;  Surgeon: Tiffany Cough, MD;  Location: [facility] ORS;  Service: Orthopedics;  Laterality: Left;  70 min NO BLOOD PRODUCTS!!   TREATMENT FISTULA ANAL     yrs ago per pt 0n 02-02-2021   TRIGGER FINGER RELEASE Left 02/08/2021   Procedure: Left Ring trigger finger release, left ring finger volar ganglion cyst excision;  Surgeon: Tiffany Agent, MD;  Location: [facility];  Service: Orthopedics;  Laterality: Left;  with local anesthesia     Outpatient Encounter Medications as of 03/13/2023  Medication Sig   acetaminophen  (TYLENOL ) 650 MG CR tablet Take 650 mg by mouth every 8 (eight) hours as needed for pain.   albuterol  (VENTOLIN  HFA) 108 (90 Base) MCG/ACT inhaler Inhale 2 puffs into  the lungs every 6 (six) hours as needed for wheezing or shortness of breath.   allopurinol  (ZYLOPRIM ) 100 MG tablet Take 1 tablet (100 mg total) by mouth daily. (Patient taking differently: Take 100 mg by mouth at bedtime.)   atorvastatin  (LIPITOR ) 80 MG tablet TAKE 1 TABLET BY MOUTH EVERYDAY AT BEDTIME   B Complex-C-Zn-Folic Acid  (DIALYVITE/ZINC) TABS Take 1 tablet by mouth every evening.   carvedilol  (COREG ) 25 MG tablet Take 25 mg by mouth 2 (two) times daily with a meal.   clopidogrel  (PLAVIX ) 75 MG tablet Take 1 tablet (75 mg total) by mouth daily.   fluticasone  (FLONASE ) 50 MCG/ACT nasal spray Place 1 spray into both nostrils daily as needed for allergies.   hydrALAZINE  (APRESOLINE ) 50 MG tablet TAKE 1 TABLET BY MOUTH TWICE A DAY   insulin  glargine (LANTUS ) 100 UNIT/ML injection Inject 10 Units into the skin daily.   levocetirizine (XYZAL) 5 MG tablet Take 5 mg by mouth in the  morning.   levothyroxine  (SYNTHROID ) 112 MCG tablet Take 224 mcg by mouth daily at 2 am.   LORazepam  (ATIVAN ) 1 MG tablet Take 1 mg by mouth daily as needed for anxiety.   Multiple Vitamins-Minerals (PRESERVISION AREDS 2 PO) Take 1 tablet by mouth in the morning.   nitroGLYCERIN  (NITROSTAT ) 0.4 MG SL tablet PLACE 1 TABLET (0.4 MG TOTAL) UNDER THE TONGUE EVERY 5 (FIVE) MINUTES X 3 DOSES AS NEEDED FOR CHEST PAIN.   Omega-3 Fatty Acids (FISH OIL) 1200 MG CAPS Take 1,200 mg by mouth at bedtime.   Polyethyl Glycol-Propyl Glycol (SYSTANE) 0.4-0.3 % SOLN Place 1 drop into both eyes in the morning, at noon, in the evening, and at bedtime.   polyethylene glycol (MIRALAX  / GLYCOLAX ) 17 g packet Take 17 g by mouth daily as needed for mild constipation or moderate constipation.   sevelamer carbonate (RENVELA) 800 MG tablet Take 2,400 mg by mouth 3 (three) times daily with meals.   traZODone  (DESYREL ) 150 MG tablet Take 150 mg by mouth at bedtime.   Facility-Administered Encounter Medications as of 03/13/2023  Medication   0.9 %   sodium chloride  infusion   sodium chloride  flush (NS) 0.9 % injection 3 mL     There were no vitals filed for this visit. There is no height or weight on file to calculate BMI.   PHYSICAL EXAM Patient appears well by phone.  Voice is strong, speech is clear.  Mood/affect appears normal for situation.  No [name] or conversational dyspnea  LAB DATA     Latest Ref Rng & Units 03/06/2023    9:11 AM 02/17/2023    1:23 PM 08/12/2022    4:08 PM  CBC  WBC 4.0 - 10.5 K/uL 4.6  4.5  4.7   Hemoglobin 12.0 - 15.0 g/dL 89.2  9.9  9.4   Hematocrit 36.0 - 46.0 % 33.2  34.3    30.8  30.6   Platelets 150 - 400 K/uL 101  86  96     CMP    Component Value Date/Time   NA 139 08/12/2022 1608   NA 137 01/25/2020 1506   K 5.1 08/12/2022 1608   CL 98 08/12/2022 1608   CO2 28 08/12/2022 1608   GLUCOSE 234 (H) 08/12/2022 1608   BUN 52 (H) 08/12/2022 1608   BUN 108 (HH) 01/25/2020 1506   CREATININE 6.07 (H) 08/12/2022 1608   CALCIUM  9.5 08/12/2022 1608   CALCIUM  10.1 10/09/2021 1052   PROT 6.3 08/12/2022 1608   PROT 6.7 04/17/2022 1518   ALBUMIN 4.4 04/17/2022 1518   AST 17 08/12/2022 1608   ALT 14 08/12/2022 1608   ALKPHOS 199 (H) 04/17/2022 1518   BILITOT 0.4 08/12/2022 1608   BILITOT 0.3 04/17/2022 1518   GFRNONAA 13 (L) 10/24/2021 0436   GFRAA 18 (L) 01/25/2020 1506     ASSESSMENT & PLAN: 73 yo female    Thrombocytopenia -She developed mild intermittent thrombocytopenia in 2021 which has become persistent and worsened in the past year, most recently 70K -No h/o acute/chronic viral illness, bariatric surgery, alcohol  use, or cirrhosis -Work up excluded B12, folate, and iron deficiencies; Hepatitis and HIV serologies are negative -no liver disease on abd US  -No meds on her list strongly contribute, she had low plt count before starting plavix  for MI in 2023 -She underwent bone marrow biopsy 03/06/23 which showed hypercellular marrow for age with trilineage hematopoiesis, features considered  nonspecific and possibly represent secondary change.  Cytogenetics show a normal  karyotype.  I spoke to pathologist and requested NGS to rule out MDS, will call her with results.  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 Gearline    CAD, s/p MI -Currently on plavix , OK to continue as long as platelets > 50K -F/up cardiology    PLAN: -Bone marrow biopsy reviewed, NGS pending - will call with results -Continue observation -CBC per PCP in 3 months -Lab and F/up with us  in 6 months -Case reviewed with Dr. Lanny   Orders Placed This Encounter  Procedures   CBC with Differential (Cancer Center Only)    Standing Status:   Standing    Number of Occurrences:   5    Expiration Date:   03/12/2024   CMP (Cancer Center only)    Standing Status:   Standing    Number of Occurrences:   5    Expiration Date:   03/12/2024      All questions were answered. The patient knows to call the clinic with any problems, questions or concerns. No barriers to learning were detected. I spent 6 minutes counseling the patient non-face to face. The total time spent in the appointment was 15 minutes and more than 50% was on counseling, review of test results, and coordination of care.   Alaura Schippers, NP-C 03/13/2023

## 2023-03-14 ENCOUNTER — Ambulatory Visit (HOSPITAL_COMMUNITY)
Admission: RE | Admit: 2023-03-14 | Discharge: 2023-03-14 | Disposition: A | Discharge status: Home / Self Care | Payer: Medicare Other | Attending: Nephrology | Admitting: Nephrology

## 2023-03-14 ENCOUNTER — Other Ambulatory Visit: Payer: Self-pay

## 2023-03-14 ENCOUNTER — Encounter (HOSPITAL_COMMUNITY): Payer: Self-pay | Admitting: Nephrology

## 2023-03-14 ENCOUNTER — Encounter (HOSPITAL_COMMUNITY)
Admission: RE | Disposition: A | Discharge status: Home / Self Care | Payer: Self-pay | Source: Home / Self Care | Attending: Nephrology

## 2023-03-14 DIAGNOSIS — T82858A Stenosis of vascular prosthetic devices, implants and grafts, initial encounter: Secondary | ICD-10-CM | POA: Insufficient documentation

## 2023-03-14 DIAGNOSIS — Z79899 Other long term (current) drug therapy: Secondary | ICD-10-CM | POA: Diagnosis not present

## 2023-03-14 DIAGNOSIS — Z6833 Body mass index (BMI) 33.0-33.9, adult: Secondary | ICD-10-CM | POA: Diagnosis not present

## 2023-03-14 DIAGNOSIS — N186 End stage renal disease: Secondary | ICD-10-CM | POA: Diagnosis not present

## 2023-03-14 DIAGNOSIS — E669 Obesity, unspecified: Secondary | ICD-10-CM | POA: Insufficient documentation

## 2023-03-14 DIAGNOSIS — Z794 Long term (current) use of insulin: Secondary | ICD-10-CM | POA: Diagnosis not present

## 2023-03-14 DIAGNOSIS — I2510 Atherosclerotic heart disease of native coronary artery without angina pectoris: Secondary | ICD-10-CM | POA: Diagnosis not present

## 2023-03-14 DIAGNOSIS — Z992 Dependence on renal dialysis: Secondary | ICD-10-CM | POA: Insufficient documentation

## 2023-03-14 DIAGNOSIS — I509 Heart failure, unspecified: Secondary | ICD-10-CM | POA: Insufficient documentation

## 2023-03-14 DIAGNOSIS — Y832 Surgical operation with anastomosis, bypass or graft as the cause of abnormal reaction of the patient, or of later complication, without mention of misadventure at the time of the procedure: Secondary | ICD-10-CM | POA: Diagnosis not present

## 2023-03-14 DIAGNOSIS — I132 Hypertensive heart and chronic kidney disease with heart failure and with stage 5 chronic kidney disease, or end stage renal disease: Secondary | ICD-10-CM | POA: Diagnosis not present

## 2023-03-14 DIAGNOSIS — Z955 Presence of coronary angioplasty implant and graft: Secondary | ICD-10-CM | POA: Diagnosis not present

## 2023-03-14 DIAGNOSIS — G4733 Obstructive sleep apnea (adult) (pediatric): Secondary | ICD-10-CM | POA: Insufficient documentation

## 2023-03-14 DIAGNOSIS — E1122 Type 2 diabetes mellitus with diabetic chronic kidney disease: Secondary | ICD-10-CM | POA: Diagnosis not present

## 2023-03-14 HISTORY — PX: PERIPHERAL VASCULAR BALLOON ANGIOPLASTY: CATH118281

## 2023-03-14 HISTORY — PX: A/V FISTULAGRAM: CATH118298

## 2023-03-14 SURGERY — A/V FISTULAGRAM
Anesthesia: LOCAL

## 2023-03-14 MED ORDER — IODIXANOL 320 MG/ML IV SOLN
INTRAVENOUS | Status: DC | PRN
  Administered 2023-03-14: 8 mL via INTRAVENOUS

## 2023-03-14 MED ORDER — LIDOCAINE HCL (PF) 1 % IJ SOLN
INTRAMUSCULAR | Status: DC | PRN
  Administered 2023-03-14: 2 mL via SUBCUTANEOUS

## 2023-03-14 MED ORDER — LIDOCAINE HCL (PF) 1 % IJ SOLN
INTRAMUSCULAR | Status: AC
  Filled 2023-03-14: qty 30

## 2023-03-14 MED ORDER — HEPARIN (PORCINE) IN NACL 1000-0.9 UT/500ML-% IV SOLN
INTRAVENOUS | Status: DC | PRN
  Administered 2023-03-14: 500 mL

## 2023-03-14 SURGICAL SUPPLY — 10 items
BAG SNAP BAND KOVER 36X36 (MISCELLANEOUS) ×2 IMPLANT
BALLN MUSTANG 5.0X40 75 (BALLOONS) ×2
BALLOON MUSTANG 5.0X40 75 (BALLOONS) IMPLANT
CATH ANGIO 5F BER2 65CM (CATHETERS) IMPLANT
COVER DOME SNAP 22 D (MISCELLANEOUS) ×2 IMPLANT
GUIDEWIRE ANGLED .035X150CM (WIRE) IMPLANT
SHEATH PINNACLE R/O II 6F 4CM (SHEATH) IMPLANT
SYR MEDALLION 10ML (SYRINGE) IMPLANT
TRAY PV CATH (CUSTOM PROCEDURE TRAY) ×2 IMPLANT
WIRE MICRO SET SILHO 5FR 7 (SHEATH) IMPLANT

## 2023-03-14 NOTE — Op Note (Signed)
  Patient presents for concerns of difficult cannulation and decreased  access flows of her left BB AVG (BC AVF created 03/2022 converted to cephalic vein to axillary vein AVG 04/2022 + viabahn of outflow).  She had a complicated angiogram on February 20, 2023 by Dr. Norine with extravasation, thrombus formation during the procedure.   On exam her AVG has a poor bruit and thrill.      Summary:  1)      The patient had successful angioplasty (5x4 Mustang FE ~10 atm) of significant stenosis in the inflow remnant cephalic vein.  2)      The body of the graft including the stents and part of the cannulation zone, outflow, venous anastomosis were widely patent. 3)      The centrals were widely patent. 4)      This left BCF with bypass graft remains amenable to limited future percutaneous intervention.  Description of procedure: The arm was prepped and draped in the usual sterile fashion. The left upper arm brachial cephalic fistula bypass graft was cannulated (63097) with a 21 gauge micropuncture needle through the stent graft directed in a retrograde direction. A guidewire was inserted and exchanged for a 6Fr sheath. Contrast [phone]) injection via the side port of the sheath was performed. The angiogram of the fistula (63097) showed a patent body of the graft, long outflow stent graft, axillary vein and centrals.  For sluggish.    The wire was advanced in the retrograde direction past the anastomosis and the tip of the wire was advanced into the proximal brachial artery.  Arteriogram was performed from the proximal brachial artery revealing a patent brachial artery, anastomosis with a long 70% inflow remnant cephalic vein stenosis.  A  5x4 Mustang angioplasty balloon was inserted over a glide wire and positioned at the inflow cephalic vein stenosis. Venous angioplasty (63097) was carried out to 10-12 ATM with FULL effacement of the waist on the balloon.  Repeat angiogram showed 10% residual at the site of  angioplasty with no evidence of extravasation.  The flow of contrast was quicker and the fistula was markedly less pulsatile.  Hemostasis: A 3-0 ethilon purse string suture was placed at the cannulation site on removal of the sheath.  Sedation: 0 mg Versed , 0 mcg Fentanyl . Sedation time. 0 minutes  Contrast. 8 mL  Monitoring: Because of the patient's comorbid conditions and sedation during the procedure, continuous EKG monitoring and O2 saturation monitoring was performed throughout the procedure by the RN. There were no abnormal arrhythmias encountered.  Complications: None.   Diagnoses: I87.1 Stricture of vein  N18.6 ESRD T82.858A Stricture of access  Procedure Coding:  [phone] Cannulation and angiogram of fistula, venous angioplasty (cephalic vein arch) V0032 Contrast  Recommendations:  1. Continue to cannulate the fistula with 15G needles.  2. Refer back for problems with flows. 3. Remove the suture next treatment.   Discharge: The patient was discharged home in stable condition. The patient was given education regarding the care of the dialysis access AVF and specific instructions in case of any problems.

## 2023-03-14 NOTE — Discharge Instructions (Signed)

## 2023-03-14 NOTE — H&P (Addendum)
 [facility]  Vascular Access H&P  Reason for Consultation: low access flows Requesting Provider: Dr. Gearline  Interval H&P  The patient has presented today for an angiogram/ angioplasty.  Various methods of treatment have been discussed with the patient.  After consideration of risk, benefits and other options for treatment, the patient has consented to a angiogram/ angioplasty with  possible stent placement.   Risks of angiogram with potential angioplasty and stenting if needed.contrast reaction, extravasation/ bleeding, dissection, hypotension and death were explained to the patient.  The patient's history has been reviewed and the patient has been examined, no changes in status.  Stable for angiogram/angioplasty  I have reviewed the patient's chart and labs.  Questions were answered to the patient's satisfaction.  Assessment/PlanCorliss Suzanne [name] is an 73 y.o. female with ESRD on  HD, CASD h/o PCI, HTN, HL, obesity, h/o OSA not on CPAP, DM who presents for difficult cannulation.    **ESRD with AV access dysfunction:  physical exam suggests possible inflow lesion but we need to confirm that there is no residual stenosis in the circuit that is affecting cannulation.  If this is a normal angiogram then she will possibly need a new access in the very near future.  I think one of the issues is dialysis having to cannulate through the skin, graft, stent graft to get where the running blood is.  She is informed of risk, benefits, alternatives to angiogram/plasty today and agreeable to proceed.  She will have conscious sedation administered with routine monitoring throughout.  All questions answered.   HPI: Tiffany [name] is an 73 y.o. female with ESRD on HD, CASD h/o PCI, HTN, HL, obesity, h/o OSA not on CPAP, DM who presents for difficult cannulation in the alarm going off on dialysis approximately 4 times each treatment.    Her L BC AVF was paced in 03/2022 but due to poor  maturation ultimately went revision in 06/2022 with interposition AVG (7mm) from cephalic vein to axillary vein.  She had outflow angioplasty at [facility] in 12/2022; she last had an angiogram by Dr. Norine February 20, 2023 with angioplasty of the outflow requiring an 8 x 10 Viabahn as well because of extravasation.  PMH: Past Medical History:  Diagnosis Date   ALLERGIC RHINITIS    Anxiety    Arthritis    CHF (congestive heart failure) (HCC)    CKD (chronic kidney disease)    Dialysis T/Th/Sa   Collagen vascular disease (HCC)    Coronary artery disease    05/12/21 - cath/ stent placement   COVID 01/21/2021   + HOME TEST  HAD COUGH CONGESTION AND MALAISE, OCC CONGESTION NOW   COVID 2022   mild   Heart murmur    at birth   Hyperlipidemia    Hypertension    Hypothyroid    Iron deficiency anemia    Myocardial infarction (HCC) 05/2021   Obesity    OSA on CPAP    no longer on a cpap   Refusal of blood transfusions as patient is Jehovah's Witness    Shingles 11/2020   LEFT BACK   Type 2 dm with insulin  use    checks abg qday runs 125   Wears glasses    PSH: Past Surgical History:  Procedure Laterality Date   A/V FISTULAGRAM Left 03/11/2022   Procedure: A/V Fistulagram;  Surgeon: Sheree Penne Bruckner, MD;  Location: [facility] INVASIVE CV LAB;  Service: Cardiovascular;  Laterality: Left;   A/V FISTULAGRAM N/A 02/20/2023  Procedure: A/V Fistulagram;  Surgeon: Norine Manuelita LABOR, MD;  Location: [facility] INVASIVE CV LAB;  Service: Cardiovascular;  Laterality: N/A;   AV FISTULA PLACEMENT Left 01/18/2022   Procedure: LEFT ARM ARTERIOVENOUS (AV) FISTULA;  Surgeon: Sheree Penne Bruckner, MD;  Location: [facility] OR;  Service: Vascular;  Laterality: Left;   BREAST BIOPSY     left breast per pt; benign 20+ yrs ago   BREAST REDUCTION SURGERY  1982   COLONOSCOPY WITH PROPOFOL  N/A 10/24/2020   Procedure: COLONOSCOPY WITH PROPOFOL ;  Surgeon: Saintclair Jasper, MD;  Location: [facility] ENDOSCOPY;  Service: Gastroenterology;   Laterality: N/A;   CORONARY STENT INTERVENTION N/A 05/12/2021   Procedure: CORONARY STENT INTERVENTION;  Surgeon: Wendel Lurena POUR, MD;  Location: [facility] INVASIVE CV LAB;  Service: Cardiovascular;  Laterality: N/A;   CORONARY/GRAFT ACUTE MI REVASCULARIZATION N/A 05/12/2021   Procedure: Coronary/Graft Acute MI Revascularization;  Surgeon: Wendel Lurena POUR, MD;  Location: [facility] INVASIVE CV LAB;  Service: Cardiovascular;  Laterality: N/A;   FISTULA SUPERFICIALIZATION Left 04/26/2022   Procedure: LEFT UPPER ARM BRACHIAL CEPHALIC FISTULA LIGATION W/ ATTEMPTED CEPHALIC VEIN STENTING;  Surgeon: Sheree Penne Bruckner, MD;  Location: [facility] OR;  Service: Vascular;  Laterality: Left;   GRAFT APPLICATION Left 04/26/2022   Procedure: LEFT UPPER ARM GORE-TEX GRAFT APPLICATION;  Surgeon: Sheree Penne Bruckner, MD;  Location: [facility] OR;  Service: Vascular;  Laterality: Left;   IR FLUORO GUIDE CV LINE RIGHT  10/18/2021   IR US  GUIDE VASC ACCESS RIGHT  10/18/2021   KNEE SURGERY Bilateral    x 2   LEFT HEART CATH AND CORONARY ANGIOGRAPHY N/A 05/12/2021   Procedure: LEFT HEART CATH AND CORONARY ANGIOGRAPHY;  Surgeon: Wendel Lurena POUR, MD;  Location: [facility] INVASIVE CV LAB;  Service: Cardiovascular;  Laterality: N/A;   LUMBAR DISC SURGERY     x 2   PERIPHERAL VASCULAR BALLOON ANGIOPLASTY  03/11/2022   Procedure: PERIPHERAL VASCULAR BALLOON ANGIOPLASTY;  Surgeon: Sheree Penne Bruckner, MD;  Location: [facility] INVASIVE CV LAB;  Service: Cardiovascular;;   PERIPHERAL VASCULAR INTERVENTION Left 02/20/2023   Procedure: PERIPHERAL VASCULAR INTERVENTION;  Surgeon: Norine Manuelita LABOR, MD;  Location: [facility] INVASIVE CV LAB;  Service: Cardiovascular;  Laterality: Left;  va stent   POLYPECTOMY  10/24/2020   Procedure: POLYPECTOMY;  Surgeon: Saintclair Jasper, MD;  Location: [facility];  Service: Gastroenterology;;   REDUCTION MAMMAPLASTY Bilateral 1985   THROMBECTOMY BRACHIAL ARTERY  01/18/2022   Procedure: THROMBECTOMY TO LEFT CEPHALIC VEIN;   Surgeon: Sheree Penne Bruckner, MD;  Location: [facility] OR;  Service: Vascular;;   TOE SURGERY     yrs ago per pt on 02-02-2021   TOTAL ABDOMINAL HYSTERECTOMY  1997   partial hysterectomy- still has ovaries   TOTAL KNEE ARTHROPLASTY Left 07/01/2019   Procedure: TOTAL KNEE ARTHROPLASTY;  Surgeon: Ernie Cough, MD;  Location: [facility] ORS;  Service: Orthopedics;  Laterality: Left;  70 min NO BLOOD PRODUCTS!!   TREATMENT FISTULA ANAL     yrs ago per pt 0n 02-02-2021   TRIGGER FINGER RELEASE Left 02/08/2021   Procedure: Left Ring trigger finger release, left ring finger volar ganglion cyst excision;  Surgeon: Alyse Agent, MD;  Location: [facility];  Service: Orthopedics;  Laterality: Left;  with local anesthesia    Past Medical History:  Diagnosis Date   ALLERGIC RHINITIS    Anxiety    Arthritis    CHF (congestive heart failure) (HCC)    CKD (chronic kidney disease)    Dialysis T/Th/Sa   Collagen vascular disease (  HCC)    Coronary artery disease    05/12/21 - cath/ stent placement   COVID 01/21/2021   + HOME TEST  HAD COUGH CONGESTION AND MALAISE, OCC CONGESTION NOW   COVID 2022   mild   Heart murmur    at birth   Hyperlipidemia    Hypertension    Hypothyroid    Iron deficiency anemia    Myocardial infarction (HCC) 05/2021   Obesity    OSA on CPAP    no longer on a cpap   Refusal of blood transfusions as patient is Jehovah's Witness    Shingles 11/2020   LEFT BACK   Type 2 dm with insulin  use    checks abg qday runs 125   Wears glasses     Medications:  I have reviewed the patient's current medications.  Facility-Administered Medications Prior to Admission  Medication Dose Route Frequency Provider Last Rate Last Admin   0.9 %  sodium chloride  infusion  250 mL Intravenous PRN Sheree Penne Bruckner, MD       sodium chloride  flush (NS) 0.9 % injection 3 mL  3 mL Intravenous Q12H Sheree Penne Bruckner, MD       Medications Prior to Admission   Medication Sig Dispense Refill   acetaminophen  (TYLENOL ) 650 MG CR tablet Take 650 mg by mouth every 8 (eight) hours as needed for pain.     albuterol  (VENTOLIN  HFA) 108 (90 Base) MCG/ACT inhaler Inhale 2 puffs into the lungs every 6 (six) hours as needed for wheezing or shortness of breath.     allopurinol  (ZYLOPRIM ) 100 MG tablet Take 1 tablet (100 mg total) by mouth daily. (Patient taking differently: Take 100 mg by mouth at bedtime.) 30 tablet 0   atorvastatin  (LIPITOR ) 80 MG tablet TAKE 1 TABLET BY MOUTH EVERYDAY AT BEDTIME 90 tablet 3   B Complex-C-Zn-Folic Acid  (DIALYVITE/ZINC) TABS Take 1 tablet by mouth every evening.     carvedilol  (COREG ) 25 MG tablet Take 25 mg by mouth 2 (two) times daily with a meal.     clopidogrel  (PLAVIX ) 75 MG tablet Take 1 tablet (75 mg total) by mouth daily. 90 tablet 3   fluticasone  (FLONASE ) 50 MCG/ACT nasal spray Place 1 spray into both nostrils daily as needed for allergies.     hydrALAZINE  (APRESOLINE ) 50 MG tablet TAKE 1 TABLET BY MOUTH TWICE A DAY 180 tablet 2   insulin  glargine (LANTUS ) 100 UNIT/ML injection Inject 10 Units into the skin daily.     levocetirizine (XYZAL) 5 MG tablet Take 5 mg by mouth in the morning.     levothyroxine  (SYNTHROID ) 112 MCG tablet Take 224 mcg by mouth daily at 2 am.     LORazepam  (ATIVAN ) 1 MG tablet Take 1 mg by mouth daily as needed for anxiety.     Multiple Vitamins-Minerals (PRESERVISION AREDS 2 PO) Take 1 tablet by mouth in the morning.     nitroGLYCERIN  (NITROSTAT ) 0.4 MG SL tablet PLACE 1 TABLET (0.4 MG TOTAL) UNDER THE TONGUE EVERY 5 (FIVE) MINUTES X 3 DOSES AS NEEDED FOR CHEST PAIN. 25 tablet 7   Omega-3 Fatty Acids (FISH OIL) 1200 MG CAPS Take 1,200 mg by mouth at bedtime.     Polyethyl Glycol-Propyl Glycol (SYSTANE) 0.4-0.3 % SOLN Place 1 drop into both eyes in the morning, at noon, in the evening, and at bedtime.     polyethylene glycol (MIRALAX  / GLYCOLAX ) 17 g packet Take 17 g by mouth daily as needed for  mild  constipation or moderate constipation.     sevelamer carbonate (RENVELA) 800 MG tablet Take 2,400 mg by mouth 3 (three) times daily with meals.     traZODone  (DESYREL ) 150 MG tablet Take 150 mg by mouth at bedtime.      ALLERGIES:   Allergies  Allergen Reactions   Other     No blood products   Sulfa Antibiotics Rash    FAM HX: Family History  Problem Relation Age of Onset   Prostate cancer Father    Congestive Heart Failure Mother    Diabetes Sister    Other Sister        septis   Breast cancer Neg Hx     Social History:   reports that she has never smoked. She has never used smokeless tobacco. She reports that she does not currently use alcohol . She reports that she does not use drugs.  ROS: 12 system ROS neg except per HPI  Blood pressure 122/63, pulse (!) 58, resp. rate 14, weight 101.6 kg, SpO2 94%. PHYSICAL EXAM: Gen:  well appearing  Eyes: glasses ENT: class 2 airway Neck: supple CV: RRR Back: sats normal on RA, clear ant Extr:  LUE AVF/ AVG bypass pulsatile with weak bruit Neuro: nonfocal    No results found for this or any previous visit (from the past 48 hours).  No results found.   MELIA LYNWOOD ORN 03/14/2023, 8:44 AM

## 2023-03-17 ENCOUNTER — Telehealth: Payer: Self-pay | Admitting: Nurse Practitioner

## 2023-03-17 NOTE — Telephone Encounter (Signed)
 Scheduled appointments per 2/6 los. Patient is aware of the made appointments.

## 2023-03-19 ENCOUNTER — Encounter (HOSPITAL_COMMUNITY): Payer: Self-pay | Admitting: Hematology

## 2023-03-28 ENCOUNTER — Telehealth: Payer: Self-pay | Admitting: Hematology

## 2023-03-28 NOTE — Telephone Encounter (Signed)
Patient is aware of cancelled appointment, patient is also aware of scheduled appointments for the future

## 2023-03-31 ENCOUNTER — Ambulatory Visit: Payer: Medicare Other | Admitting: Hematology

## 2023-04-02 NOTE — Progress Notes (Signed)
 Cardiology Office Note:   Date:  04/07/2023  ID:  Tiffany [name], DOB Jan 26, 1951, MRN [id number] PCP:  Fleet Contras, MD  [facility] Providers Cardiologist:  Alverda Skeans, MD Referring MD: Fleet Contras, MD  Chief Complaint/Reason for Referral: Follow-up for coronary artery disease ASSESSMENT:    1. Coronary artery disease involving native coronary artery of native heart without angina pectoris   2. Hyperlipidemia LDL goal <55   3. Aortic atherosclerosis (HCC)   4. Essential hypertension   5. ESRD (end stage renal disease) (HCC)   6. Chronic diastolic heart failure (HCC)   7. BMI 30.0-30.9,adult   8. Preoperative cardiovascular examination   9. Nonrheumatic mitral valve regurgitation     PLAN:   In order of problems listed above: Coronary artery disease: Continue Plavix 75 mg indefinitely and atorvastatin 80 mg; Plavix on hold until her procedure then she will restart afterwards. Hyperlipidemia: Continue atorvastatin 80 mg and check lipid panel and LFTs today. Aortic atherosclerosis: Continue Plavix 75 mg atorvastatin 80 mg, and strict blood pressure control. Hypertension: Blood pressure is well-controlled.  Continue hydralazine 50 mg twice daily.  Given low blood pressures after dialysis will change Coreg to Toprol XL at 37.5 mg at bedtime. End-stage renal disease: Continue hemodialysis: Not a candidate for SGLT2 inhibitor given end-stage renal disease. Chronic diastolic heart failure: Continue Coreg 25 mg twice daily, hydralazine twice daily and volume control with dialysis.  Echocardiogram demonstrated speckled appearance.  Consider CMR at next visit.  I would order now but I do not think we can get it before her nerve stimulator implantation.  We will have to see whether CMR can be obtained with a nerve stimulator in place.  Discuss at next visit. Elevated BMI: A1c April 2023 6.7 suggestive of diabetes; will refer to pharmacy for recommendations regarding GLP-1 receptor  agonist therapy. Preoperative cardiovascular evaluation: The patient is to undergo spinal nerve stimulator implantation.  Patient will continue to hold Plavix until her procedure and then restart Plavix monotherapy after that. MR: Mild on echocardiogram September 2024.  Monitor for now.            Dispo:  Return in about 6 months (around 10/08/2023).      Medication Adjustments/Labs and Tests Ordered: Current medicines are reviewed at length with the patient today.  Concerns regarding medicines are outlined above.  The following changes have been made:     Labs/tests ordered: Orders Placed This Encounter  Procedures   Lipid panel   Hepatic function panel   AMB Referral to [facility] Pharm-D   EKG 12-Lead    Medication Changes: Meds ordered this encounter  Medications   metoprolol succinate (TOPROL XL) 25 MG 24 hr tablet    Sig: Take 1.5 tablets (37.5 mg total) by mouth at bedtime.    Dispense:  135 tablet    Refill:  3    Current medicines are reviewed at length with the patient today.  The patient does not have concerns regarding medicines.  I spent 38 minutes reviewing all clinical data during and prior to this visit including all relevant imaging studies, laboratories, clinical information from other health systems and prior notes from both Cardiology and other specialties, interviewing the patient, conducting a complete physical examination, and coordinating care in order to formulate a comprehensive and personalized evaluation and treatment plan.   History of Present Illness:      FOCUSED PROBLEM LIST:   Inferior STEMI 2023 DES x 1 dRCA into PDA + DES X 2  PDA Hyperlipidemia LPA 38.5 Aortic atherosclerosis CT abdomen pelvis 2022 Hypertension End-stage renal disease Not a candidate for SGLT2 inhibitor OSA Not on CPAP Jehovah's Witness Hypothyroidism BMI 30 Chronic diastolic heart failure LVH, left atrial enlargement, EF 70 to 75% TTE 2023 Severe LVH with  speckled appearance, EF 60 to 65% TTE September 2024  3/24:  The patient is a 73 y.o. female with the indicated medical history here for routine cardiology follow-up.  She was last seen in the cardiology division in September.  She reported shortness of breath with ambulation.  Blood pressure was noted to be 138/62 at that visit.  She started dialysis in September of last year.  She is been tolerating dialysis well.  She denies any chest pain, shortness of breath, palpitations, paroxysmal nocturnal dyspnea, orthopnea.  She denies any severe bleeding or bruising.  She has had no problems with her medications whatsoever.  She is otherwise well without significant complaints today.  Plan: Check lipid panel, LFTs, LP(a).  Refer to pharmacy given elevated BMI.  March 2025: Patient consents to use of AI scribe.   In the interim the patient's LP(a) was low, LDL was 31.  She was seen in September and due to fatigue and shortness of breath an echocardiogram was performed which demonstrated ejection fraction of 60 to 65%, evidence of elevated left atrial pressure and dilated atria bilaterally.  He had mild mitral regurgitation with severe MAC no significant aortic.  She did have severe LVH.  She is additionally scheduled for spinal nerve stimulator procedure in the near future.  She has no recent chest pain and is able to perform daily activities without significant shortness of breath, although she notes needing to take her time. No swelling in her legs and no issues with breathing while lying flat. No recent hospitalizations or emergency room visits.  She is currently undergoing dialysis on a Tuesday, Thursday, Saturday schedule. Her blood pressure tends to drop during the last hour of dialysis, with a noted drop to 55 mmHg systolic at home, which later increased to 88 mmHg. She has been avoiding carvedilol after dialysis due to concerns about low blood pressure.  Her cholesterol levels were previously checked  and found to be satisfactory, with a good LDL level. She is preparing for a spinal nerve stimulator procedure and has stopped taking Plavix in preparation for this procedure. She is not currently taking aspirin.  Her hemoglobin A1c was noted to be 6.3 in 2023, indicating borderline diabetes.       Current Medications: Current Meds  Medication Sig   acetaminophen (TYLENOL) 650 MG CR tablet Take 650 mg by mouth every 8 (eight) hours as needed for pain.   albuterol (VENTOLIN HFA) 108 (90 Base) MCG/ACT inhaler Inhale 2 puffs into the lungs every 6 (six) hours as needed for wheezing or shortness of breath.   atorvastatin (LIPITOR) 80 MG tablet TAKE 1 TABLET BY MOUTH EVERYDAY AT BEDTIME   B Complex-C-Zn-Folic Acid (DIALYVITE/ZINC) TABS Take 1 tablet by mouth every evening.   clopidogrel (PLAVIX) 75 MG tablet Take 1 tablet (75 mg total) by mouth daily.   fluticasone (FLONASE) 50 MCG/ACT nasal spray Place 1 spray into both nostrils daily as needed for allergies.   hydrALAZINE (APRESOLINE) 50 MG tablet TAKE 1 TABLET BY MOUTH TWICE A DAY   insulin glargine (LANTUS) 100 UNIT/ML injection Inject 10 Units into the skin daily.   levocetirizine (XYZAL) 5 MG tablet Take 5 mg by mouth in the morning.   levothyroxine (  SYNTHROID) 112 MCG tablet Take 224 mcg by mouth daily at 2 am.   LORazepam (ATIVAN) 1 MG tablet Take 1 mg by mouth daily as needed for anxiety.   metoprolol succinate (TOPROL XL) 25 MG 24 hr tablet Take 1.5 tablets (37.5 mg total) by mouth at bedtime.   Multiple Vitamins-Minerals (PRESERVISION AREDS 2 PO) Take 1 tablet by mouth in the morning.   nitroGLYCERIN (NITROSTAT) 0.4 MG SL tablet PLACE 1 TABLET (0.4 MG TOTAL) UNDER THE TONGUE EVERY 5 (FIVE) MINUTES X 3 DOSES AS NEEDED FOR CHEST PAIN.   Omega-3 Fatty Acids (FISH OIL) 1200 MG CAPS Take 1,200 mg by mouth at bedtime.   Polyethyl Glycol-Propyl Glycol (SYSTANE) 0.4-0.3 % SOLN Place 1 drop into both eyes in the morning, at noon, in the evening,  and at bedtime.   polyethylene glycol (MIRALAX / GLYCOLAX) 17 g packet Take 17 g by mouth daily as needed for mild constipation or moderate constipation.   sevelamer carbonate (RENVELA) 800 MG tablet Take 2,400 mg by mouth 3 (three) times daily with meals.   traZODone (DESYREL) 150 MG tablet Take 150 mg by mouth at bedtime.   [DISCONTINUED] carvedilol (COREG) 25 MG tablet Take 25 mg by mouth 2 (two) times daily with a meal.   Current Facility-Administered Medications for the 04/07/23 encounter (Office Visit) with Orbie Pyo, MD  Medication   0.9 %  sodium chloride infusion   sodium chloride flush (NS) 0.9 % injection 3 mL     Review of Systems:   Please see the history of present illness.    All other systems reviewed and are negative.     EKGs/Labs/Other Test Reviewed:   EKG: EKG from September 2024 demonstrates sinus bradycardia with nonspecific T wave changes.  EKG Interpretation Date/Time:  Monday April 07 2023 10:06:52 EST Ventricular Rate:  65 PR Interval:  184 QRS Duration:  92 QT Interval:  442 QTC Calculation: 459 R Axis:   104  Text Interpretation: Normal sinus rhythm Lateral infarct , age undetermined When compared with ECG of 14-Oct-2022 13:28, Lateral infarct is now Present Nonspecific T wave abnormality now evident in Lateral leads Confirmed by Alverda Skeans ([id number]) on 04/07/2023 10:14:56 AM         Risk Assessment/Calculations:          Physical Exam:   VS:  BP (!) 116/56   Pulse 62   Ht 5\' 9"  (1.753 m)   Wt 227 lb 6.4 oz (103.1 kg)   SpO2 98%   BMI 33.58 kg/m        Wt Readings from Last 3 Encounters:  04/07/23 227 lb 6.4 oz (103.1 kg)  03/14/23 224 lb (101.6 kg)  02/20/23 222 lb (100.7 kg)      GENERAL:  No apparent distress, AOx3 HEENT:  No carotid bruits, +2 carotid impulses, no scleral icterus CAR: RRR with 2 out of 6 holosystolic murmur which increases with expiration; no gallops, rubs, or thrills RES:  Clear to auscultation  bilaterally ABD:  Soft, nontender, nondistended, positive bowel sounds x 4 VASC:  +2 radial pulses, +2 carotid pulses NEURO:  CN 2-12 grossly intact; motor and sensory grossly intact PSYCH:  No active depression or anxiety EXT:  No edema, ecchymosis, or cyanosis  Signed, Orbie Pyo, MD  04/07/2023 10:33 AM    [facility] [address] [place], [place], [place]  [address] Phone: [phone]; Fax: [phone]   Note:  This document was prepared using Dragon  voice recognition software and may include unintentional dictation errors.

## 2023-04-03 ENCOUNTER — Telehealth: Payer: Self-pay | Admitting: Internal Medicine

## 2023-04-03 NOTE — Telephone Encounter (Signed)
   Pre-operative Risk Assessment    Patient Name: Tiffany [name]  DOB: 1950/09/13 MRN: [id number]   Date of last office visit: 10/14/2022 Date of next office visit: 04/07/2023   Request for Surgical Clearance    Procedure:   spinal cord stimulator permanent  Date of Surgery:  Clearance 04/11/23                                Surgeon:  Dr. Wynonia Lawman Surgeon's Group or Practice Name:  [facility] Phone number:  [phone] Fax number:  [phone]   Type of Clearance Requested:   - Medical  - Pharmacy:  Hold Clopidogrel (Plavix) Wants patient to hold 7 days prior to procedure   Type of Anesthesia:  Not Indicated   Additional requests/questions:    Sharen Hones   04/03/2023, 3:25 PM

## 2023-04-07 ENCOUNTER — Ambulatory Visit: Payer: Medicare Other | Attending: Internal Medicine | Admitting: Internal Medicine

## 2023-04-07 ENCOUNTER — Encounter: Payer: Self-pay | Admitting: Internal Medicine

## 2023-04-07 VITALS — BP 116/56 | HR 62 | Ht 69.0 in | Wt 227.4 lb

## 2023-04-07 DIAGNOSIS — E785 Hyperlipidemia, unspecified: Secondary | ICD-10-CM

## 2023-04-07 DIAGNOSIS — N186 End stage renal disease: Secondary | ICD-10-CM

## 2023-04-07 DIAGNOSIS — Z01810 Encounter for preprocedural cardiovascular examination: Secondary | ICD-10-CM

## 2023-04-07 DIAGNOSIS — I340 Nonrheumatic mitral (valve) insufficiency: Secondary | ICD-10-CM

## 2023-04-07 DIAGNOSIS — I2510 Atherosclerotic heart disease of native coronary artery without angina pectoris: Secondary | ICD-10-CM

## 2023-04-07 DIAGNOSIS — I5032 Chronic diastolic (congestive) heart failure: Secondary | ICD-10-CM

## 2023-04-07 DIAGNOSIS — I700 Atherosclerosis of aorta: Secondary | ICD-10-CM | POA: Diagnosis not present

## 2023-04-07 DIAGNOSIS — I10 Essential (primary) hypertension: Secondary | ICD-10-CM

## 2023-04-07 DIAGNOSIS — Z6830 Body mass index (BMI) 30.0-30.9, adult: Secondary | ICD-10-CM

## 2023-04-07 MED ORDER — METOPROLOL SUCCINATE ER 25 MG PO TB24: 37.5000 mg | ORAL_TABLET | Freq: Every day | ORAL | 3 refills | Status: AC

## 2023-04-07 NOTE — Patient Instructions (Addendum)
 Medication Instructions:  Your physician has recommended you make the following change in your medication:   1) STOP carvedilol (Coreg) 2) START metoprolol succinate (Toprol XL) 37.5 mg daily at bedtime  **Resume taking your Plavix when instructed to do so by your surgeon after your procedure.**  *If you need a refill on your cardiac medications before your next appointment, please call your pharmacy*  Lab Work: TODAY: Lipid panel, LFTs If you have labs (blood work) drawn today and your tests are completely normal, you will receive your results only by: MyChart Message (if you have MyChart) OR A paper copy in the mail If you have any lab test that is abnormal or we need to change your treatment, we will call you to review the results.  Testing/Procedures: None ordered today.  Follow-Up: At [facility], you and your health needs are our priority.  As part of our continuing mission to provide you with exceptional heart care, we have created designated Provider Care Teams.  These Care Teams include your primary Cardiologist (physician) and Advanced Practice Providers (APPs -  Physician Assistants and Nurse Practitioners) who all work together to provide you with the care you need, when you need it.  Your next appointment:   6 month(s)  The format for your next appointment:   In Person  Provider:   Robin Searing, NP  Other Instructions You have been referred to our Pharmacist to discuss GLP-1 agonist medications (Ozempic, Wegovy, etc) for weight management. A scheduler will call you to schedule an appointment.    1st Floor: - Lobby - Registration  - Pharmacy  - Lab - Cafe  2nd Floor: - PV Lab - Diagnostic Testing (echo, CT, nuclear med)  3rd Floor: - Vacant  4th Floor: - TCTS (cardiothoracic surgery) - AFib Clinic - Structural Heart Clinic - Vascular Surgery  - Vascular Ultrasound  5th Floor: - [facility] (general and EP) - Clinical Pharmacy for  coumadin, hypertension, lipid, weight-loss medications, and med management appointments    Valet parking services will be available as well.

## 2023-04-07 NOTE — Telephone Encounter (Signed)
   Patient Name: Tiffany [name]  DOB: May 19, 1950 MRN: [id number]  Primary Cardiologist: Orbie Pyo, MD  Chart reviewed as part of pre-operative protocol coverage. Given past medical history and time since last visit, based on ACC/AHA guidelines, [name] is at acceptable risk for the planned procedure without further cardiovascular testing.   Patient was seen by Dr. Lynnette Caffey on 3/3, okay for patient to hold Plavix x7 days prior to her surgery. (She has already been holding with surgery scheduled 04/11/23).   Please resume Plavix as soon as possible postprocedure, at the discretion of the surgeon.   I will route this recommendation to the requesting party via Epic fax function and remove from pre-op pool.  Please call with questions.  Perlie Gold, PA-C 04/07/2023, 11:27 AM

## 2023-04-08 ENCOUNTER — Encounter: Payer: Self-pay | Admitting: Internal Medicine

## 2023-04-08 LAB — LIPID PANEL
Chol/HDL Ratio: 1.8 ratio (ref 0.0–4.4)
Cholesterol, Total: 91 mg/dL — ABNORMAL LOW (ref 100–199)
HDL: 50 mg/dL (ref >39)
LDL Chol Calc (NIH): 28 mg/dL (ref 0–99)
Triglycerides: 50 mg/dL (ref 0–149)
VLDL Cholesterol Cal: 13 mg/dL (ref 5–40)

## 2023-04-08 LAB — HEPATIC FUNCTION PANEL
ALT: 23 IU/L (ref 0–32)
AST: 24 IU/L (ref 0–40)
Albumin: 3.8 g/dL (ref 3.8–4.8)
Alkaline Phosphatase: 205 IU/L — ABNORMAL HIGH (ref 44–121)
Bilirubin Total: 0.2 mg/dL (ref 0.0–1.2)
Bilirubin, Direct: 0.1 mg/dL (ref 0.00–0.40)
Total Protein: 6 g/dL (ref 6.0–8.5)

## 2023-04-16 ENCOUNTER — Other Ambulatory Visit: Payer: Self-pay | Admitting: Internal Medicine

## 2023-04-16 DIAGNOSIS — I10 Essential (primary) hypertension: Secondary | ICD-10-CM

## 2023-04-21 ENCOUNTER — Other Ambulatory Visit: Payer: Self-pay | Admitting: Internal Medicine

## 2023-04-21 DIAGNOSIS — N186 End stage renal disease: Secondary | ICD-10-CM

## 2023-04-21 DIAGNOSIS — I10 Essential (primary) hypertension: Secondary | ICD-10-CM

## 2023-04-21 DIAGNOSIS — I2111 ST elevation (STEMI) myocardial infarction involving right coronary artery: Secondary | ICD-10-CM

## 2023-04-21 DIAGNOSIS — I5032 Chronic diastolic (congestive) heart failure: Secondary | ICD-10-CM

## 2023-04-21 DIAGNOSIS — I700 Atherosclerosis of aorta: Secondary | ICD-10-CM

## 2023-04-21 DIAGNOSIS — E785 Hyperlipidemia, unspecified: Secondary | ICD-10-CM

## 2023-04-21 DIAGNOSIS — Z6837 Body mass index (BMI) 37.0-37.9, adult: Secondary | ICD-10-CM

## 2023-04-28 ENCOUNTER — Other Ambulatory Visit: Payer: Self-pay | Admitting: Internal Medicine

## 2023-05-12 ENCOUNTER — Ambulatory Visit (HOSPITAL_COMMUNITY)
Admission: RE | Admit: 2023-05-12 | Discharge: 2023-05-12 | Disposition: A | Discharge status: Home / Self Care | Attending: Nephrology | Admitting: Nephrology

## 2023-05-12 ENCOUNTER — Encounter (HOSPITAL_COMMUNITY): Payer: Self-pay | Admitting: Nephrology

## 2023-05-12 ENCOUNTER — Encounter (HOSPITAL_COMMUNITY)
Admission: RE | Disposition: A | Discharge status: Home / Self Care | Payer: Self-pay | Source: Home / Self Care | Attending: Nephrology

## 2023-05-12 DIAGNOSIS — Z955 Presence of coronary angioplasty implant and graft: Secondary | ICD-10-CM | POA: Insufficient documentation

## 2023-05-12 DIAGNOSIS — I509 Heart failure, unspecified: Secondary | ICD-10-CM | POA: Diagnosis not present

## 2023-05-12 DIAGNOSIS — T82858A Stenosis of vascular prosthetic devices, implants and grafts, initial encounter: Secondary | ICD-10-CM | POA: Diagnosis present

## 2023-05-12 DIAGNOSIS — Z992 Dependence on renal dialysis: Secondary | ICD-10-CM | POA: Diagnosis not present

## 2023-05-12 DIAGNOSIS — E785 Hyperlipidemia, unspecified: Secondary | ICD-10-CM | POA: Insufficient documentation

## 2023-05-12 DIAGNOSIS — I2510 Atherosclerotic heart disease of native coronary artery without angina pectoris: Secondary | ICD-10-CM | POA: Insufficient documentation

## 2023-05-12 DIAGNOSIS — I132 Hypertensive heart and chronic kidney disease with heart failure and with stage 5 chronic kidney disease, or end stage renal disease: Secondary | ICD-10-CM | POA: Insufficient documentation

## 2023-05-12 DIAGNOSIS — D631 Anemia in chronic kidney disease: Secondary | ICD-10-CM | POA: Diagnosis not present

## 2023-05-12 DIAGNOSIS — Y832 Surgical operation with anastomosis, bypass or graft as the cause of abnormal reaction of the patient, or of later complication, without mention of misadventure at the time of the procedure: Secondary | ICD-10-CM | POA: Insufficient documentation

## 2023-05-12 DIAGNOSIS — Z794 Long term (current) use of insulin: Secondary | ICD-10-CM | POA: Diagnosis not present

## 2023-05-12 DIAGNOSIS — Z79899 Other long term (current) drug therapy: Secondary | ICD-10-CM | POA: Diagnosis not present

## 2023-05-12 DIAGNOSIS — G4733 Obstructive sleep apnea (adult) (pediatric): Secondary | ICD-10-CM | POA: Insufficient documentation

## 2023-05-12 DIAGNOSIS — N186 End stage renal disease: Secondary | ICD-10-CM | POA: Diagnosis not present

## 2023-05-12 DIAGNOSIS — E1122 Type 2 diabetes mellitus with diabetic chronic kidney disease: Secondary | ICD-10-CM | POA: Insufficient documentation

## 2023-05-12 HISTORY — PX: A/V SHUNT INTERVENTION: CATH118220

## 2023-05-12 HISTORY — PX: VENOUS ANGIOPLASTY: CATH118376

## 2023-05-12 LAB — GLUCOSE, CAPILLARY
Glucose-Capillary: 117 mg/dL — ABNORMAL HIGH (ref 70–99)
Glucose-Capillary: 112 mg/dL — ABNORMAL HIGH (ref 70–99)

## 2023-05-12 SURGERY — A/V SHUNT INTERVENTION
Anesthesia: LOCAL

## 2023-05-12 MED ORDER — HEPARIN (PORCINE) IN NACL 1000-0.9 UT/500ML-% IV SOLN
INTRAVENOUS | Status: DC | PRN
  Administered 2023-05-12: 500 mL

## 2023-05-12 MED ORDER — IODIXANOL 320 MG/ML IV SOLN
INTRAVENOUS | Status: DC | PRN
  Administered 2023-05-12: 5 mL via INTRAVENOUS

## 2023-05-12 MED ORDER — LIDOCAINE HCL (PF) 1 % IJ SOLN
INTRAMUSCULAR | Status: AC
  Filled 2023-05-12: qty 30

## 2023-05-12 MED ORDER — SODIUM CHLORIDE 0.9 % IV SOLN: INTRAVENOUS | Status: DC

## 2023-05-12 MED ORDER — LIDOCAINE HCL (PF) 1 % IJ SOLN
INTRAMUSCULAR | Status: DC | PRN
  Administered 2023-05-12: 2 mL via SUBCUTANEOUS

## 2023-05-12 SURGICAL SUPPLY — 10 items
BAG SNAP BAND KOVER 36X36 (MISCELLANEOUS) ×2 IMPLANT
BALLN ATHLETIS 6X40X75 (BALLOONS) ×2 IMPLANT
BALLOON ATHLETIS 6X40X75 (BALLOONS) IMPLANT
CATH SLIP KMP 65CM 5FR (CATHETERS) IMPLANT
COVER DOME SNAP 22 D (MISCELLANEOUS) ×2 IMPLANT
GUIDEWIRE ANGLED .035 180CM (WIRE) IMPLANT
SHEATH PINNACLE R/O II 6F 4CM (SHEATH) IMPLANT
SYR MEDALLION 10ML (SYRINGE) IMPLANT
TRAY PV CATH (CUSTOM PROCEDURE TRAY) ×2 IMPLANT
WIRE MICRO SET SILHO 5FR 7 (SHEATH) IMPLANT

## 2023-05-12 NOTE — Op Note (Signed)
 Patient referred for the management of cannulation difficulties and decreased access flows of her left upper arm AVG (initially a BC AVF created in 03/2022 that was converted to cephalic vein to axillary vein AVG 04/2022 with viabahn of outflow).  She had a complicated angiogram on February 20, 2023 by Dr. Glenna Fellows with extravasation, thrombus formation during the procedure; her last procedure here was 5 mm inflow cephalic vein angioplasty on 03/14/2023.   On exam her AVG has a poor bruit and thrill.       Summary:  1)      The patient had successful angioplasty (6 x 4 Athletis FE ~16 atm) of significant 70% stenosis in the inflow remnant cephalic vein.  2)      The body of the graft including the outflow stents extending from the graft outflow and across the venous anastomosis into the axillary vein were widely patent. 3)      The left central veins, arterial anastomosis and proximal brachial artery were widely patent. 4)      This left BCF with bypass graft remains amenable to limited future percutaneous intervention.   Description of procedure: The arm was prepped and draped in the usual sterile fashion. The left upper arm brachial cephalic fistula bypass graft was cannulated (16109) with a 21 gauge micropuncture needle through the stent graft directed in a retrograde direction. A guidewire was inserted and exchanged for a 6Fr sheath. Contrast [phone]) injection via the side port of the sheath was performed. The angiogram of the fistula (09811) showed a patent body of the graft, long/overlapping outflow stent grafts, axillary vein and left central veins.   The wire was advanced in the retrograde direction past the anastomosis and the tip of the wire was advanced into the proximal brachial artery.  Arteriogram was performed from the proximal brachial artery revealing a patent brachial artery, anastomosis with a long 70% inflow remnant cephalic vein stenosis.   A 6 x 4 Athletis angioplasty balloon was  inserted over a glide wire and positioned at the inflow cephalic vein stenosis. Venous angioplasty (91478) was carried out to 16 ATM with FULL effacement of the waist on the balloon. Repeat angiogram showed 10% residual at the site of angioplasty with no evidence of extravasation.  The flow of contrast was quicker and the fistula inflow was markedly less pulsatile.   Hemostasis: A 3-0 ethilon purse string suture was placed at the cannulation site on removal of the sheath.   Sedation: None   Contrast. 5 mL   Monitoring: Because of the patient's comorbid conditions and sedation during the procedure, continuous EKG monitoring and O2 saturation monitoring was performed throughout the procedure by the RN. There were no abnormal arrhythmias encountered.   Complications: None.    Diagnoses: I87.1 Stricture of vein  N18.6 ESRD T82.858A Stricture of access   Procedure Coding:  [id number] Cannulation and angiogram of fistula, venous angioplasty (cephalic vein inflow) Z3086 Contrast   Recommendations:  1. Continue to cannulate the graft with 15G needles.  2. Refer back for problems with flows. 3. Remove the suture next treatment.    Discharge: The patient was discharged home in stable condition. The patient was given education regarding the care of the dialysis access AVF and specific instructions in case of any problems.

## 2023-05-12 NOTE — H&P (Signed)
 Chief Complaint: Decreased access flows/clearance HPI:  73 year old woman with past medical history significant for coronary artery disease status post PCI, hypertension, dyslipidemia, obesity, sleep apnea, type 2 diabetes mellitus and end-stage renal disease on hemodialysis on a TTS schedule.  She is referred for concerns with decreasing access flows/clearances and intermittent cannulation difficulties.  She has a left brachiocephalic fistula created in February 2024 with revision and placement of interposition graft from cephalic vein to axillary vein in May 2024.  She had 8 mm outflow angioplasty of the venous anastomosis/intragraft stenosis 3 months ago complicated by extravasation and requiring an 8 x 100 Viabahn stent placement.  2 months ago, she had 5 mm Mustang inflow cephalic vein angioplasty.  She denies any steal symptoms and has not had any arm swelling but has intermittent infiltration.  She denies any chest pain or shortness of breath and has not had any recent fever or chills.  The procedure was explained to the patient and she is willing to proceed after weighing risks and benefits.  Past Medical History:  Diagnosis Date   ALLERGIC RHINITIS    Anxiety    Arthritis    CHF (congestive heart failure) (HCC)    CKD (chronic kidney disease)    Dialysis T/Th/Sa   Collagen vascular disease (HCC)    Coronary artery disease    05/12/21 - cath/ stent placement   COVID 01/21/2021   + HOME TEST  HAD COUGH CONGESTION AND MALAISE, OCC CONGESTION NOW   COVID 2022   mild   Heart murmur    at birth   Hyperlipidemia    Hypertension    Hypothyroid    Iron deficiency anemia    Myocardial infarction (HCC) 05/2021   Obesity    OSA on CPAP    no longer on a cpap   Refusal of blood transfusions as patient is Jehovah's Witness    Shingles 11/2020   LEFT BACK   Type 2 dm with insulin use    checks abg qday runs 125   Wears glasses     Past Surgical History:  Procedure Laterality Date    A/V FISTULAGRAM Left 03/11/2022   Procedure: A/V Fistulagram;  Surgeon: Maeola Harman, MD;  Location[facility] INVASIVE CV LAB;  Service: Cardiovascular;  Laterality: Left;   A/V FISTULAGRAM N/A 02/20/2023   Procedure: A/V Fistulagram;  Surgeon: Tyler Pita, MD;  Location: [facility] INVASIVE CV LAB;  Service: Cardiovascular;  Laterality: N/A;   A/V FISTULAGRAM N/A 03/14/2023   Procedure: A/V Fistulagram;  Surgeon: Ethelene Hal, MD;  Location: [facility] INVASIVE CV LAB;  Service: Cardiovascular;  Laterality: N/A;   AV FISTULA PLACEMENT Left 01/18/2022   Procedure: LEFT ARM ARTERIOVENOUS (AV) FISTULA;  Surgeon: Maeola Harman, MD;  Location: [facility] OR;  Service: Vascular;  Laterality: Left;   BREAST BIOPSY     left breast per pt; benign 20+ yrs ago   BREAST REDUCTION SURGERY  1982   COLONOSCOPY WITH PROPOFOL N/A 10/24/2020   Procedure: COLONOSCOPY WITH PROPOFOL;  Surgeon: Kerin Salen, MD;  Location: [facility] ENDOSCOPY;  Service: Gastroenterology;  Laterality: N/A;   CORONARY STENT INTERVENTION N/A 05/12/2021   Procedure: CORONARY STENT INTERVENTION;  Surgeon: Orbie Pyo, MD;  Location: [facility] INVASIVE CV LAB;  Service: Cardiovascular;  Laterality: N/A;   CORONARY/GRAFT ACUTE MI REVASCULARIZATION N/A 05/12/2021   Procedure: Coronary/Graft Acute MI Revascularization;  Surgeon: Orbie Pyo, MD;  Location: [facility] INVASIVE CV LAB;  Service: Cardiovascular;  Laterality: N/A;   FISTULA SUPERFICIALIZATION Left  04/26/2022   Procedure: LEFT UPPER ARM BRACHIAL CEPHALIC FISTULA LIGATION W/ ATTEMPTED CEPHALIC VEIN STENTING;  Surgeon: Maeola Harman, MD;  Location: [facility] OR;  Service: Vascular;  Laterality: Left;   GRAFT APPLICATION Left 04/26/2022   Procedure: LEFT UPPER ARM GORE-TEX GRAFT APPLICATION;  Surgeon: Maeola Harman, MD;  Location: [facility] OR;  Service: Vascular;  Laterality: Left;   IR FLUORO GUIDE CV LINE RIGHT  10/18/2021   IR US GUIDE VASC ACCESS RIGHT  10/18/2021   KNEE SURGERY  Bilateral    x 2   LEFT HEART CATH AND CORONARY ANGIOGRAPHY N/A 05/12/2021   Procedure: LEFT HEART CATH AND CORONARY ANGIOGRAPHY;  Surgeon: Orbie Pyo, MD;  Location: [facility] INVASIVE CV LAB;  Service: Cardiovascular;  Laterality: N/A;   LUMBAR DISC SURGERY     x 2   PERIPHERAL VASCULAR BALLOON ANGIOPLASTY  03/11/2022   Procedure: PERIPHERAL VASCULAR BALLOON ANGIOPLASTY;  Surgeon: Maeola Harman, MD;  Location: [facility] INVASIVE CV LAB;  Service: Cardiovascular;;   PERIPHERAL VASCULAR BALLOON ANGIOPLASTY  03/14/2023   Procedure: PERIPHERAL VASCULAR BALLOON ANGIOPLASTY;  Surgeon: Ethelene Hal, MD;  Location: [facility] INVASIVE CV LAB;  Service: Cardiovascular;;  Inflow Cephalic Vein   PERIPHERAL VASCULAR INTERVENTION Left 02/20/2023   Procedure: PERIPHERAL VASCULAR INTERVENTION;  Surgeon: Tyler Pita, MD;  Location: [facility] INVASIVE CV LAB;  Service: Cardiovascular;  Laterality: Left;  va stent   POLYPECTOMY  10/24/2020   Procedure: POLYPECTOMY;  Surgeon: Kerin Salen, MD;  Location: [facility] ENDOSCOPY;  Service: Gastroenterology;;   REDUCTION MAMMAPLASTY Bilateral 1985   THROMBECTOMY BRACHIAL ARTERY  01/18/2022   Procedure: THROMBECTOMY TO LEFT CEPHALIC VEIN;  Surgeon: Maeola Harman, MD;  Location: [facility] OR;  Service: Vascular;;   TOE SURGERY     yrs ago per pt on 02-02-2021   TOTAL ABDOMINAL HYSTERECTOMY  1997   partial hysterectomy- still has ovaries   TOTAL KNEE ARTHROPLASTY Left 07/01/2019   Procedure: TOTAL KNEE ARTHROPLASTY;  Surgeon: Durene Romans, MD;  Location: [facility] ORS;  Service: Orthopedics;  Laterality: Left;  70 min NO BLOOD PRODUCTS!!   TREATMENT FISTULA ANAL     yrs ago per pt 0n 02-02-2021   TRIGGER FINGER RELEASE Left 02/08/2021   Procedure: Left Ring trigger finger release, left ring finger volar ganglion cyst excision;  Surgeon: Gomez Cleverly, MD;  Location: [facility];  Service: Orthopedics;  Laterality: Left;  with local anesthesia    Family History   Problem Relation Age of Onset   Prostate cancer Father    Congestive Heart Failure Mother    Diabetes Sister    Other Sister        septis   Breast cancer Neg Hx    Social History:  reports that she has never smoked. She has never used smokeless tobacco. She reports that she does not currently use alcohol. She reports that she does not use drugs.  Allergies:  Allergies  Allergen Reactions   Other     No blood products   Sulfa Antibiotics Rash    Facility-Administered Medications Prior to Admission  Medication Dose Route Frequency Provider Last Rate Last Admin   0.9 %  sodium chloride infusion  250 mL Intravenous PRN Maeola Harman, MD       sodium chloride flush (NS) 0.9 % injection 3 mL  3 mL Intravenous Q12H Maeola Harman, MD       Medications Prior to Admission  Medication Sig Dispense Refill   acetaminophen (TYLENOL) 650 MG  CR tablet Take 650 mg by mouth every 8 (eight) hours as needed for pain.     allopurinol (ZYLOPRIM) 100 MG tablet Take 1 tablet (100 mg total) by mouth daily. (Patient taking differently: Take 100 mg by mouth at bedtime.) 30 tablet 0   atorvastatin (LIPITOR) 80 MG tablet TAKE 1 TABLET BY MOUTH EVERYDAY AT BEDTIME 90 tablet 3   B Complex-C-Zn-Folic Acid (DIALYVITE/ZINC) TABS Take 1 tablet by mouth every evening.     clopidogrel (PLAVIX) 75 MG tablet TAKE 1 TABLET BY MOUTH EVERY DAY 90 tablet 3   fluticasone (FLONASE) 50 MCG/ACT nasal spray Place 1 spray into both nostrils daily as needed for allergies.     hydrALAZINE (APRESOLINE) 50 MG tablet TAKE 1 TABLET BY MOUTH TWICE A DAY 180 tablet 3   insulin glargine (LANTUS) 100 UNIT/ML injection Inject 10 Units into the skin daily.     levocetirizine (XYZAL) 5 MG tablet Take 5 mg by mouth in the morning.     levothyroxine (SYNTHROID) 112 MCG tablet Take 112 mcg by mouth daily at 2 am.     LORazepam (ATIVAN) 1 MG tablet Take 1 mg by mouth daily as needed for anxiety.     metoprolol  succinate (TOPROL XL) 25 MG 24 hr tablet Take 1.5 tablets (37.5 mg total) by mouth at bedtime. 135 tablet 3   Multiple Vitamins-Minerals (PRESERVISION AREDS 2 PO) Take 1 tablet by mouth in the morning.     nitroGLYCERIN (NITROSTAT) 0.4 MG SL tablet PLACE 1 TABLET (0.4 MG TOTAL) UNDER THE TONGUE EVERY 5 (FIVE) MINUTES X 3 DOSES AS NEEDED FOR CHEST PAIN. 25 tablet 7   Omega-3 Fatty Acids (FISH OIL) 1200 MG CAPS Take 1,200 mg by mouth at bedtime.     Polyethyl Glycol-Propyl Glycol (SYSTANE) 0.4-0.3 % SOLN Place 1 drop into both eyes in the morning, at noon, in the evening, and at bedtime.     polyethylene glycol (MIRALAX / GLYCOLAX) 17 g packet Take 17 g by mouth daily as needed for mild constipation or moderate constipation.     sevelamer carbonate (RENVELA) 800 MG tablet Take 2,400 mg by mouth 3 (three) times daily with meals.     traZODone (DESYREL) 150 MG tablet Take 150 mg by mouth at bedtime.      Results for orders placed or performed during the hospital encounter of 05/12/23 (from the past 48 hours)  Glucose, capillary     Status: Abnormal   Collection Time: 05/12/23  7:27 AM  Result Value Ref Range   Glucose-Capillary 117 (H) 70 - 99 mg/dL    Comment: Glucose reference range applies only to samples taken after fasting for at least 8 hours.   No results found.  Review of Systems  All other systems reviewed and are negative.   Blood pressure 130/70, pulse 64, temperature 98.2 F (36.8 C), temperature source Oral, SpO2 100%. Physical Exam Vitals and nursing note reviewed.  Constitutional:      Appearance: Normal appearance. She is obese.  HENT:     Head: Normocephalic and atraumatic.     Mouth/Throat:     Mouth: Mucous membranes are dry.     Pharynx: Oropharynx is clear.  Eyes:     Extraocular Movements: Extraocular movements intact.     Conjunctiva/sclera: Conjunctivae normal.  Cardiovascular:     Rate and Rhythm: Normal rate and regular rhythm.     Pulses: Normal pulses.   Pulmonary:     Effort: Pulmonary effort is normal.  Breath sounds: Normal breath sounds.  Musculoskeletal:     Cervical back: Normal range of motion and neck supple.     Comments: Left upper arm AV graft with adjacent hematomas from recent infiltration.  Poor outflow thrill/bruit that is high-pitched at the inflow with some hyper pulsatility.  Neurological:     Mental Status: She is alert.      Assessment/Plan 1.  Decreased access flows: Will undertake shuntogram today to evaluate for circuit stenosis and offer management with angioplasty as indicated. 2.  End-stage renal disease: Resume hemodialysis on TTS schedule following completion of procedure today. 3.  Hypertension: Blood pressures currently at goal, monitor with moderate sedation during procedure. 4.  Anemia: Without overt blood loss, continue H/H monitoring and ESA therapy at outpatient dialysis.  Dagoberto Ligas, MD 05/12/2023, 7:49 AM

## 2023-05-26 ENCOUNTER — Ambulatory Visit: Attending: Internal Medicine | Admitting: Pharmacist

## 2023-05-26 ENCOUNTER — Encounter (HOSPITAL_COMMUNITY): Payer: Self-pay

## 2023-05-26 ENCOUNTER — Telehealth: Payer: Self-pay | Admitting: Pharmacy Technician

## 2023-05-26 ENCOUNTER — Encounter: Payer: Self-pay | Admitting: Pharmacist

## 2023-05-26 ENCOUNTER — Other Ambulatory Visit (HOSPITAL_COMMUNITY): Payer: Self-pay

## 2023-05-26 ENCOUNTER — Telehealth: Payer: Self-pay | Admitting: Pharmacist

## 2023-05-26 VITALS — Ht 69.5 in | Wt 234.4 lb

## 2023-05-26 DIAGNOSIS — E66811 Obesity, class 1: Secondary | ICD-10-CM | POA: Diagnosis not present

## 2023-05-26 DIAGNOSIS — Z6834 Body mass index (BMI) 34.0-34.9, adult: Secondary | ICD-10-CM

## 2023-05-26 DIAGNOSIS — E1169 Type 2 diabetes mellitus with other specified complication: Secondary | ICD-10-CM

## 2023-05-26 DIAGNOSIS — Z6830 Body mass index (BMI) 30.0-30.9, adult: Secondary | ICD-10-CM | POA: Diagnosis not present

## 2023-05-26 NOTE — Patient Instructions (Signed)
 Meal Planning:   Meal planning is the key to setting you up for success. Here are some examples of healthy meal options.   Breakfast     Option 1:  Omelette with vegetables (1 egg, spinach, mushrooms, or other vegetable of your choice), 2 slices whole-grain toast, tip of thumb size butter or soft margarine,  cup low-fat milk or yogurt    Option 2: steel-cut rolled oats (? cup dry), 1 tbsp peanut butter added to cooked oats,  cup low-fat milk.   Option 3: 2 slices whole-grain or rye toast with avocado spread ( small avocado mased with herbs and pepper to taste), 1 poached egg or sunnyside up (cooked to your liking)   Option 4:  cup plain 0% [place] yogurt topped with  cup berries and  cup walnuts or almonds, 2 slices whole-grain or rye toast, tip of thumb size soft margarine/butter   Lunch:    Option 1: 2 cups red lentil soup, green salad with 1 tbsp homemade vinaigrette (extra virgin olive oil and vinegar of choice plus spices)   Option 2: 3 oz. roasted chicken, 2 slices whole-grain bread, 2 tsp mayonnaise, mustard, lettuce, tomato if desired, 1 fruit (example: medium-sized apple or small pear)   Option 3: 3 oz. tuna packed in water, 1 whole-wheat pita (6 inch), 2 tsp mayonnaise, lettuce, tomato, or other non-starchy vegetable of your choice, 1 fruit (example: medium-sized apple or small pear)   Option 4: 1 serving of garden veggie buddha bowl with lentils and tahini sauce and 1 cup berries topped with  cup plain 0% Greek yogurt   Dinner:    Option 1: 1 serving roasted cauliflower salad, 3-4 oz.  grilled or baked pork loin chop, 1/2 cup mashed potato, or brown rice or quinoa    Option 2: 1 serving fish (baked, grilled or air fried), green salad, 1 tbsp homemade vinaigrette,  cup cooked couscous   Option 3: 1 cup cooked whole grained pasta (example: spaghetti, spirals, macaroni),  cup favorite pasta sauce (preferably homemade), 3-4 oz.  grilled or baked chicken, green salad, 1  tbsp homemade vinaigrette   Option 4: 1 serving oven roasted salmon,  cup mashed sweet potato or couscous or brown rice or quinoa, broccoli (steamed or roasted)   Healthy snacks:    Carrots or celery with 1 tbsp of hummus    1 medium-sized fruit (apple or orange)   1 cup plain 0% [place] yogurt with  cup berries   Half apple, sliced, with 1 tbsp (15 mL) peanut or almond butter

## 2023-05-26 NOTE — Progress Notes (Signed)
 Patient ID: Tiffany [name]                 DOB: [birth year]                    MRN: [id number]     HPI: Tiffany [name] is a 73 y.o. female patient referred to pharmacy clinic by Dr. Lorie Rook to initiate GLP1-RA therapy. PMH is significant for CAD, HLD, aortic atherosclerosis, HTN, ESRD, HFpEF, mitral valve regurgitation, T2DM, and obesity. Most recent BMI 34.6.  Patient has a history of diabetes with an A1C in April 2023 of 6.7%, improved to 6.3% in January 2025. Patient checks BG once daily in the mornings, readings in 80s-90, managed on insulin  glargine 10 units nightly.   She presents to clinic today for discussion about GLP-1 therapy for weight loss. She has been on weight loss medication once before in her 30s, cannot recall the name but said it was a pill. Patient was concerned about the GI side effects with GLP-1s as her brother has experienced nausea/vomiting on one. She currently follows a CKD-friendly diet, watches her potassium and phosphorous, sees a dietician. Not currently exercising due to chronic back pain. She used to go to a twice-weekly class for chair exercises and is willing to try again.   Baseline weight and BMI: 234 lbs, BMI 34.6 (ht 5'9")  Diet:  Breakfast: grits Lunch: Sandwich - white bread, [place], mayonnaise Dinner: fried fish, rice and green beans, fried chicken, ground beef, pork chop  Snack: peantbutter crackers, cheddar cheese crackers, reports not snacking often Drink: lemonade, sprite - zero sugar, 1-2 cups water  per day (32oz fluid restriction)    Exercise: Limited by recent spinal stimulation procedure, no exercise lately  Family History: heart failure (mother), diabetes (sister)  Social History: Never smoker. She has never used smokeless tobacco. She reports that she does not currently use alcohol . She reports that she does not use drugs.   Labs: Lab Results  Component Value Date   HGBA1C 6.2 (H) 10/17/2021    Wt Readings from Last 1  Encounters:  04/07/23 227 lb 6.4 oz (103.1 kg)    BP Readings from Last 1 Encounters:  05/12/23 (!) 147/70   Pulse Readings from Last 1 Encounters:  05/12/23 60       Component Value Date/Time   CHOL 91 (L) 04/07/2023 1049   TRIG 50 04/07/2023 1049   HDL 50 04/07/2023 1049   CHOLHDL 1.8 04/07/2023 1049   CHOLHDL 2.0 08/12/2022 1608   VLDL 10 05/11/2021 2339   LDLCALC 28 04/07/2023 1049   LDLCALC 31 08/12/2022 1608    Past Medical History:  Diagnosis Date   ALLERGIC RHINITIS    Anxiety    Arthritis    CHF (congestive heart failure) (HCC)    CKD (chronic kidney disease)    Dialysis T/Th/Sa   Collagen vascular disease (HCC)    Coronary artery disease    05/12/21 - cath/ stent placement   COVID 01/21/2021   + HOME TEST  HAD COUGH CONGESTION AND MALAISE, OCC CONGESTION NOW   COVID 2022   mild   Heart murmur    at birth   Hyperlipidemia    Hypertension    Hypothyroid    Iron deficiency anemia    Myocardial infarction (HCC) 05/2021   Obesity    OSA on CPAP    no longer on a cpap   Refusal of blood transfusions as patient is Jehovah's Witness    Shingles  11/2020   LEFT BACK   Type 2 dm with insulin  use    checks abg qday runs 125   Wears glasses     Current Outpatient Medications on File Prior to Visit  Medication Sig Dispense Refill   acetaminophen  (TYLENOL ) 650 MG CR tablet Take 650 mg by mouth every 8 (eight) hours as needed for pain.     allopurinol  (ZYLOPRIM ) 100 MG tablet Take 1 tablet (100 mg total) by mouth daily. (Patient taking differently: Take 100 mg by mouth at bedtime.) 30 tablet 0   atorvastatin  (LIPITOR ) 80 MG tablet TAKE 1 TABLET BY MOUTH EVERYDAY AT BEDTIME 90 tablet 3   B Complex-C-Zn-Folic Acid  (DIALYVITE/ZINC) TABS Take 1 tablet by mouth every evening.     clopidogrel  (PLAVIX ) 75 MG tablet TAKE 1 TABLET BY MOUTH EVERY DAY 90 tablet 3   fluticasone  (FLONASE ) 50 MCG/ACT nasal spray Place 1 spray into both nostrils daily as needed for allergies.      hydrALAZINE  (APRESOLINE ) 50 MG tablet TAKE 1 TABLET BY MOUTH TWICE A DAY 180 tablet 3   insulin  glargine (LANTUS ) 100 UNIT/ML injection Inject 10 Units into the skin daily.     levocetirizine (XYZAL) 5 MG tablet Take 5 mg by mouth in the morning.     levothyroxine  (SYNTHROID ) 112 MCG tablet Take 112 mcg by mouth daily at 2 am.     LORazepam  (ATIVAN ) 1 MG tablet Take 1 mg by mouth daily as needed for anxiety.     metoprolol  succinate (TOPROL  XL) 25 MG 24 hr tablet Take 1.5 tablets (37.5 mg total) by mouth at bedtime. 135 tablet 3   Multiple Vitamins-Minerals (PRESERVISION AREDS 2 PO) Take 1 tablet by mouth in the morning.     nitroGLYCERIN  (NITROSTAT ) 0.4 MG SL tablet PLACE 1 TABLET (0.4 MG TOTAL) UNDER THE TONGUE EVERY 5 (FIVE) MINUTES X 3 DOSES AS NEEDED FOR CHEST PAIN. 25 tablet 7   Omega-3 Fatty Acids (FISH OIL) 1200 MG CAPS Take 1,200 mg by mouth at bedtime.     Polyethyl Glycol-Propyl Glycol (SYSTANE) 0.4-0.3 % SOLN Place 1 drop into both eyes in the morning, at noon, in the evening, and at bedtime.     polyethylene glycol (MIRALAX  / GLYCOLAX ) 17 g packet Take 17 g by mouth daily as needed for mild constipation or moderate constipation.     sevelamer carbonate (RENVELA) 800 MG tablet Take 2,400 mg by mouth 3 (three) times daily with meals.     traZODone  (DESYREL ) 150 MG tablet Take 150 mg by mouth at bedtime.     Current Facility-Administered Medications on File Prior to Visit  Medication Dose Route Frequency Provider Last Rate Last Admin   0.9 %  sodium chloride  infusion  250 mL Intravenous PRN Adine Hoof, MD       sodium chloride  flush (NS) 0.9 % injection 3 mL  3 mL Intravenous Q12H Adine Hoof, MD        Allergies  Allergen Reactions   Other     No blood products   Sulfa Antibiotics Rash     Assessment/Plan:  1. Weight loss - Patient has not met goal of at least 5% of body weight loss with comprehensive lifestyle modifications alone in the past  3-6 months. Pharmacotherapy is appropriate to pursue as augmentation. Will assess coverage for Mounjaro. Confirmed patient not pregnant and no personal or family history of medullary thyroid  carcinoma (MTC) or Multiple Endocrine Neoplasia syndrome type 2 (MEN 2). Injection technique reviewed  at today's visit.  Advised patient on common side effects including nausea, diarrhea, dyspepsia, decreased appetite, and fatigue. Counseled patient on reducing meal size and how to titrate medication to minimize side effects. Counseled patient to call if intolerable side effects or if experiencing dehydration, abdominal pain, or dizziness. Patient will adhere to dietary modifications and will target at least 150 minutes of moderate intensity exercise weekly.   Follow up in 1 month via telephone for tolerability update and dose titration.   Jolyn Needles, PharmD Candidate 2025 [facility] 05/26/23

## 2023-05-26 NOTE — Progress Notes (Signed)
 Patient ID: Tiffany [name]                 DOB: 07/20/1950                    MRN: [id number]     HPI: Tiffany [name] is a 73 y.o. female patient referred to pharmacy clinic by Dr. Lorie Rook to initiate GLP1-RA therapy. PMH is significant for CAD, HLD, aortic atherosclerosis, HTN, ESRD, HFpEF, mitral valve regurgitation, T2DM, and obesity. Most recent BMI 34.6.  Patient has a history of diabetes with an A1C in April 2023 of 6.7%, improved to 6.3% in January 2025. Patient checks BG once daily in the mornings, readings in 80s-90, managed on insulin  glargine 10 units nightly.   She presents to clinic today for discussion about GLP-1 therapy for weight loss. She has been on weight loss medication once before in her 30s, cannot recall the name but said it was a pill. Patient was concerned about the GI side effects with GLP-1s as her brother has experienced nausea/vomiting on one. She currently follows a CKD-friendly diet, watches her potassium and phosphorous, sees a dietician. Not currently exercising due to chronic back pain. She used to go to a twice-weekly class for chair exercises and is willing to try again.   Baseline weight and BMI: 234 lbs, BMI 34.6 (ht 5'9")  Diet:  Breakfast: grits Lunch: Sandwich - white bread, [place], mayonnaise Dinner: fried fish, rice and green beans, fried chicken, ground beef, pork chop  Snack: peantbutter crackers, cheddar cheese crackers, reports not snacking often Drink: lemonade, sprite - zero sugar, 1-2 cups water  per day (32oz fluid restriction)    Exercise: Limited by recent spinal stimulation procedure, no exercise lately  Family History: heart failure (mother), diabetes (sister)  Social History: Never smoker. She has never used smokeless tobacco. She reports that she does not currently use alcohol . She reports that she does not use drugs.   Labs: Lab Results  Component Value Date   HGBA1C 6.2 (H) 10/17/2021    Wt Readings from Last 1  Encounters:  05/26/23 234 lb 6.4 oz (106.3 kg)    BP Readings from Last 1 Encounters:  05/12/23 (!) 147/70   Pulse Readings from Last 1 Encounters:  05/12/23 60       Component Value Date/Time   CHOL 91 (L) 04/07/2023 1049   TRIG 50 04/07/2023 1049   HDL 50 04/07/2023 1049   CHOLHDL 1.8 04/07/2023 1049   CHOLHDL 2.0 08/12/2022 1608   VLDL 10 05/11/2021 2339   LDLCALC 28 04/07/2023 1049   LDLCALC 31 08/12/2022 1608    Past Medical History:  Diagnosis Date   ALLERGIC RHINITIS    Anxiety    Arthritis    CHF (congestive heart failure) (HCC)    CKD (chronic kidney disease)    Dialysis T/Th/Sa   Collagen vascular disease (HCC)    Coronary artery disease    05/12/21 - cath/ stent placement   COVID 01/21/2021   + HOME TEST  HAD COUGH CONGESTION AND MALAISE, OCC CONGESTION NOW   COVID 2022   mild   Heart murmur    at birth   Hyperlipidemia    Hypertension    Hypothyroid    Iron deficiency anemia    Myocardial infarction (HCC) 05/2021   Obesity    OSA on CPAP    no longer on a cpap   Refusal of blood transfusions as patient is Jehovah's Witness    Shingles  11/2020   LEFT BACK   Type 2 dm with insulin  use    checks abg qday runs 125   Wears glasses     Current Outpatient Medications on File Prior to Visit  Medication Sig Dispense Refill   acetaminophen  (TYLENOL ) 650 MG CR tablet Take 650 mg by mouth every 8 (eight) hours as needed for pain.     allopurinol  (ZYLOPRIM ) 100 MG tablet Take 1 tablet (100 mg total) by mouth daily. (Patient taking differently: Take 100 mg by mouth at bedtime.) 30 tablet 0   atorvastatin  (LIPITOR ) 80 MG tablet TAKE 1 TABLET BY MOUTH EVERYDAY AT BEDTIME 90 tablet 3   B Complex-C-Zn-Folic Acid  (DIALYVITE/ZINC) TABS Take 1 tablet by mouth every evening.     clopidogrel  (PLAVIX ) 75 MG tablet TAKE 1 TABLET BY MOUTH EVERY DAY 90 tablet 3   fluticasone  (FLONASE ) 50 MCG/ACT nasal spray Place 1 spray into both nostrils daily as needed for allergies.      hydrALAZINE  (APRESOLINE ) 50 MG tablet TAKE 1 TABLET BY MOUTH TWICE A DAY 180 tablet 3   insulin  glargine (LANTUS ) 100 UNIT/ML injection Inject 10 Units into the skin daily.     levocetirizine (XYZAL) 5 MG tablet Take 5 mg by mouth in the morning.     levothyroxine  (SYNTHROID ) 112 MCG tablet Take 112 mcg by mouth daily at 2 am.     LORazepam  (ATIVAN ) 1 MG tablet Take 1 mg by mouth daily as needed for anxiety.     metoprolol  succinate (TOPROL  XL) 25 MG 24 hr tablet Take 1.5 tablets (37.5 mg total) by mouth at bedtime. 135 tablet 3   Multiple Vitamins-Minerals (PRESERVISION AREDS 2 PO) Take 1 tablet by mouth in the morning.     nitroGLYCERIN  (NITROSTAT ) 0.4 MG SL tablet PLACE 1 TABLET (0.4 MG TOTAL) UNDER THE TONGUE EVERY 5 (FIVE) MINUTES X 3 DOSES AS NEEDED FOR CHEST PAIN. 25 tablet 7   Omega-3 Fatty Acids (FISH OIL) 1200 MG CAPS Take 1,200 mg by mouth at bedtime.     Polyethyl Glycol-Propyl Glycol (SYSTANE) 0.4-0.3 % SOLN Place 1 drop into both eyes in the morning, at noon, in the evening, and at bedtime.     polyethylene glycol (MIRALAX  / GLYCOLAX ) 17 g packet Take 17 g by mouth daily as needed for mild constipation or moderate constipation.     sevelamer carbonate (RENVELA) 800 MG tablet Take 2,400 mg by mouth 3 (three) times daily with meals.     traZODone  (DESYREL ) 150 MG tablet Take 150 mg by mouth at bedtime.     Current Facility-Administered Medications on File Prior to Visit  Medication Dose Route Frequency Provider Last Rate Last Admin   0.9 %  sodium chloride  infusion  250 mL Intravenous PRN Adine Hoof, MD       sodium chloride  flush (NS) 0.9 % injection 3 mL  3 mL Intravenous Q12H Adine Hoof, MD        Allergies  Allergen Reactions   Other     No blood products   Sulfa Antibiotics Rash     Assessment/Plan:  1. Weight loss - Patient has not met goal of at least 5% of body weight loss with comprehensive lifestyle modifications alone in the past  3-6 months. Pharmacotherapy is appropriate to pursue as augmentation. Will assess coverage for Mounjaro. Once start GLP1 agent will d/c basal insulin . Confirmed patient has no personal or family history of medullary thyroid  carcinoma (MTC) or Multiple Endocrine Neoplasia syndrome type  2 (MEN 2). Injection technique reviewed at today's visit.  Advised patient on common side effects including nausea, diarrhea, dyspepsia, decreased appetite, and fatigue. Counseled patient on reducing meal size and how to titrate medication to minimize side effects. Counseled patient to call if intolerable side effects or if experiencing dehydration, abdominal pain, or dizziness. Patient will adhere to dietary modifications and will target at least 150 minutes of moderate intensity exercise weekly.   Follow up in 1 month via telephone for tolerability update and dose titration.   Jolyn Needles, PharmD Candidate 2025 [facility] 05/26/23  Nickola Baron, Pharm.D [facility] A Division of [facility] [address], [place], [place] [address]  Phone: [phone]; Fax: [phone]

## 2023-05-26 NOTE — Telephone Encounter (Signed)
 Pharmacy Patient Advocate Encounter   Received notification from Pt Calls Messages that prior authorization for mounjaro is required/requested.   Insurance verification completed.   The patient is insured through [employer] .   Per test claim: PA required; PA submitted to above mentioned insurance via CoverMyMeds Key/confirmation #/EOC [id number] Status is pending

## 2023-05-26 NOTE — Telephone Encounter (Signed)
 Pharmacy Patient Advocate Encounter  Received notification from [facility] that Prior Authorization for Tiffany [name] has been APPROVED from 05/26/23 to 02/04/24. Ran test claim, Copay is $12.15- one month. This test claim was processed through [facility]- copay amounts may vary at other pharmacies due to pharmacy/plan contracts, or as the patient moves through the different stages of their insurance plan.   PA #/Case ID/Reference #: [id number]

## 2023-05-27 NOTE — Telephone Encounter (Signed)
 PA approved see other encounter for more info

## 2023-05-30 MED ORDER — MOUNJARO 2.5 MG/0.5ML ~~LOC~~ SOAJ: 2.5000 mg | SUBCUTANEOUS | 0 refills | Status: DC

## 2023-05-30 NOTE — Addendum Note (Signed)
 Addended by: Asiya Cutbirth K on: 05/30/2023 02:54 PM   Modules accepted: Orders

## 2023-06-02 MED ORDER — MOUNJARO 2.5 MG/0.5ML ~~LOC~~ SOAJ: 2.5000 mg | SUBCUTANEOUS | 0 refills | Status: DC

## 2023-06-02 NOTE — Addendum Note (Signed)
 Addended by: Natha Guin K on: 06/02/2023 04:11 PM   Modules accepted: Orders

## 2023-06-04 MED ORDER — MOUNJARO 2.5 MG/0.5ML ~~LOC~~ SOAJ: 2.5000 mg | SUBCUTANEOUS | 0 refills | Status: DC

## 2023-06-04 NOTE — Addendum Note (Signed)
 Addended by: Waunetta Riggle K on: 06/04/2023 02:12 PM   Modules accepted: Orders

## 2023-07-05 ENCOUNTER — Telehealth: Payer: Self-pay | Admitting: Student in an Organized Health Care Education/Training Program

## 2023-07-05 NOTE — Telephone Encounter (Signed)
 Patient had questions on Mounjaro. Reviewed indications/contraindications and concomitant insulin  use.

## 2023-09-05 NOTE — Procedures (Signed)
Mask fit

## 2023-09-09 NOTE — Assessment & Plan Note (Deleted)
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

## 2023-09-09 NOTE — Assessment & Plan Note (Deleted)
-  secondary to ESRD - No evidence of nutritional anemia or hemolysis -Bone marrow biopsy in 09/24/2023 was unremarkable -Continue monitoring, will consider ESA if her hemoglobin drops below 9 consistently

## 2023-09-10 ENCOUNTER — Inpatient Hospital Stay: Payer: Medicare Other

## 2023-09-10 ENCOUNTER — Inpatient Hospital Stay: Payer: Medicare Other | Admitting: Hematology

## 2023-09-10 DIAGNOSIS — D638 Anemia in other chronic diseases classified elsewhere: Secondary | ICD-10-CM

## 2023-09-10 DIAGNOSIS — D696 Thrombocytopenia, unspecified: Secondary | ICD-10-CM

## 2023-09-11 ENCOUNTER — Telehealth: Payer: Self-pay | Admitting: Hematology

## 2023-09-11 NOTE — Telephone Encounter (Signed)
 Scheduled appointments per 8/6 secure chat. Talked with the patient and she is aware of the made appointments.

## 2023-09-12 ENCOUNTER — Other Ambulatory Visit: Payer: Self-pay

## 2023-09-12 ENCOUNTER — Ambulatory Visit (HOSPITAL_COMMUNITY)
Admission: RE | Admit: 2023-09-12 | Discharge: 2023-09-12 | Disposition: A | Discharge status: Home / Self Care | Attending: Surgery | Admitting: Surgery

## 2023-09-12 ENCOUNTER — Encounter (HOSPITAL_COMMUNITY)
Admission: RE | Disposition: A | Discharge status: Home / Self Care | Payer: Self-pay | Source: Home / Self Care | Attending: Surgery

## 2023-09-12 DIAGNOSIS — Z992 Dependence on renal dialysis: Secondary | ICD-10-CM | POA: Insufficient documentation

## 2023-09-12 DIAGNOSIS — T82858A Stenosis of vascular prosthetic devices, implants and grafts, initial encounter: Secondary | ICD-10-CM | POA: Insufficient documentation

## 2023-09-12 DIAGNOSIS — Y832 Surgical operation with anastomosis, bypass or graft as the cause of abnormal reaction of the patient, or of later complication, without mention of misadventure at the time of the procedure: Secondary | ICD-10-CM | POA: Insufficient documentation

## 2023-09-12 DIAGNOSIS — N186 End stage renal disease: Secondary | ICD-10-CM | POA: Diagnosis present

## 2023-09-12 HISTORY — PX: A/V SHUNT INTERVENTION: CATH118220

## 2023-09-12 HISTORY — PX: VENOUS ANGIOPLASTY: CATH118376

## 2023-09-12 LAB — GLUCOSE, CAPILLARY: Glucose-Capillary: 161 mg/dL — ABNORMAL HIGH (ref 70–99)

## 2023-09-12 SURGERY — A/V SHUNT INTERVENTION
Anesthesia: LOCAL | Site: Arm Upper | Laterality: Left

## 2023-09-12 MED ORDER — HEPARIN (PORCINE) IN NACL 1000-0.9 UT/500ML-% IV SOLN
INTRAVENOUS | Status: DC | PRN
  Administered 2023-09-12: 500 mL

## 2023-09-12 MED ORDER — LIDOCAINE HCL (PF) 1 % IJ SOLN
INTRAMUSCULAR | Status: DC | PRN
  Administered 2023-09-12: 5 mL via INTRADERMAL

## 2023-09-12 MED ORDER — IODIXANOL 320 MG/ML IV SOLN
INTRAVENOUS | Status: DC | PRN
  Administered 2023-09-12: 25 mL

## 2023-09-12 MED ORDER — LIDOCAINE HCL (PF) 1 % IJ SOLN
INTRAMUSCULAR | Status: AC
  Filled 2023-09-12: qty 30

## 2023-09-12 SURGICAL SUPPLY — 9 items
BALLOON MUSTANG 6X80X75 (BALLOONS) IMPLANT
GLIDEWIRE ADV .035X260CM (WIRE) IMPLANT
KIT ENCORE 26 ADVANTAGE (KITS) IMPLANT
KIT MICROPUNCTURE NIT STIFF (SHEATH) IMPLANT
KIT PV (KITS) ×2 IMPLANT
SHEATH PINNACLE R/O II 6F 4CM (SHEATH) IMPLANT
SHEATH PROBE COVER 6X72 (BAG) IMPLANT
TRAY PV CATH (CUSTOM PROCEDURE TRAY) ×2 IMPLANT
TUBING CIL FLEX 10 FLL-RA (TUBING) IMPLANT

## 2023-09-12 NOTE — H&P (Signed)
   Patient name: Tiffany [name] MRN: [id number] DOB: [birth year] Sex: female  REASON FOR VISIT:    Dialysis access issues  HISTORY OF PRESENT ILLNESS:   Tiffany [name] is a 73 y.o. female who returns for management of access difficulties regarding her left upper arm dialysis graft.  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  Most recently she had treatment of a inflow stenosis.  She continues to have access issues and is back for repeat study  CURRENT MEDICATIONS:    No current facility-administered medications for this encounter.    REVIEW OF SYSTEMS:   [X]  denotes positive finding, [ ]  denotes negative finding Cardiac  Comments:  Chest pain or chest pressure:    Shortness of breath upon exertion:    Short of breath when lying flat:    Irregular heart rhythm:    Constitutional    Fever or chills:      PHYSICAL EXAM:   Vitals:   09/12/23 0858 09/12/23 0925  BP: (!) 103/51 (!) 107/51  Pulse: (!) 59 (!) 58  Resp: 12 14  Temp: 98 F (36.7 C)   TempSrc: Oral   SpO2: 97% 95%    GENERAL: The patient is a well-nourished female, in no acute distress. The vital signs are documented above. CARDIOVASCULAR: There is a regular rate and rhythm. PULMONARY: Non-labored respirations Palpable left arm graft  STUDIES:      MEDICAL ISSUES:   ESRD: I discussed the details of the procedure.  All questions were answered.  We will proceed with shuntogram with intervention as indicated.  Malvina Serene CLORE, MD, FACS [facility] [phone] Pager [phone]

## 2023-09-12 NOTE — Op Note (Signed)
    Patient name: Tiffany [name] MRN: [id number] DOB: 12-Aug-1950 Sex: female  09/12/2023 Pre-operative Diagnosis: Decreased flow rates Post-operative diagnosis:  Same Surgeon:  Malvina New Procedure Performed:  1.  Ultrasound-guided access, left arm dialysis graft  2.  Shuntogram  3.  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  She is having trouble with flow rates and is here for further evaluation  Procedure:  The patient was identified in the holding area and taken to room 8.  The patient was then placed supine on the table and prepped and draped in the usual sterile fashion.  A time out was called. Ultrasound was used to evaluate the fistula.  The vein was patent and compressible.  A digital ultrasound image was acquired.  The graft was then accessed under ultrasound guidance using a micropuncture needle.  An 018 wire was then asvanced without resistance and a micropuncture sheath was placed.  Contrast injections were then performed through the sheath.  Findings: No evidence of central venous stenosis.  Long segment stent within the left upper arm graft with mild to moderate stenosis.  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  Over a Glidewire advantage, a 6 [place] sheath was placed.  The wire was directed into the proximal brachial artery.  I selected a 6 x 80 Mustang balloon to perform balloon venoplasty of the proximal fistula/graft at 12 atm.  Completion imaging revealed resolution of the stenosis.  In order to address the areas in the graft this will require a second access which would be and the recently intervened on segment and so elected not to do this.  There was significant improvement in the thrill  within her access.  The sheath was removed and the Monocryl used for closure.  Impression:  #1  Successful balloon venoplasty of proximal fistula/graft using a 6 mm balloon  #2  The fistula remains amenable to future percutaneous interventions    V. Malvina New, M.D., [facility] [place] Office: [phone] Pager:  [phone]

## 2023-09-15 ENCOUNTER — Other Ambulatory Visit: Payer: Self-pay | Admitting: Internal Medicine

## 2023-09-15 ENCOUNTER — Encounter (HOSPITAL_COMMUNITY): Payer: Self-pay | Admitting: Surgery

## 2023-09-15 DIAGNOSIS — Z1231 Encounter for screening mammogram for malignant neoplasm of breast: Secondary | ICD-10-CM

## 2023-09-24 ENCOUNTER — Inpatient Hospital Stay (HOSPITAL_BASED_OUTPATIENT_CLINIC_OR_DEPARTMENT_OTHER): Admitting: Hematology

## 2023-09-24 ENCOUNTER — Encounter: Payer: Self-pay | Admitting: Hematology

## 2023-09-24 ENCOUNTER — Inpatient Hospital Stay: Attending: Hematology

## 2023-09-24 VITALS — BP 138/82 | HR 59 | Temp 97.9°F | Resp 16 | Ht 69.5 in | Wt 224.8 lb

## 2023-09-24 DIAGNOSIS — G4733 Obstructive sleep apnea (adult) (pediatric): Secondary | ICD-10-CM | POA: Insufficient documentation

## 2023-09-24 DIAGNOSIS — I252 Old myocardial infarction: Secondary | ICD-10-CM | POA: Diagnosis not present

## 2023-09-24 DIAGNOSIS — I509 Heart failure, unspecified: Secondary | ICD-10-CM | POA: Insufficient documentation

## 2023-09-24 DIAGNOSIS — D638 Anemia in other chronic diseases classified elsewhere: Secondary | ICD-10-CM | POA: Diagnosis not present

## 2023-09-24 DIAGNOSIS — Z90710 Acquired absence of both cervix and uterus: Secondary | ICD-10-CM | POA: Insufficient documentation

## 2023-09-24 DIAGNOSIS — Z8616 Personal history of COVID-19: Secondary | ICD-10-CM | POA: Diagnosis not present

## 2023-09-24 DIAGNOSIS — N186 End stage renal disease: Secondary | ICD-10-CM | POA: Insufficient documentation

## 2023-09-24 DIAGNOSIS — Z79899 Other long term (current) drug therapy: Secondary | ICD-10-CM | POA: Diagnosis not present

## 2023-09-24 DIAGNOSIS — D6959 Other secondary thrombocytopenia: Secondary | ICD-10-CM | POA: Diagnosis not present

## 2023-09-24 DIAGNOSIS — E1122 Type 2 diabetes mellitus with diabetic chronic kidney disease: Secondary | ICD-10-CM | POA: Insufficient documentation

## 2023-09-24 DIAGNOSIS — D696 Thrombocytopenia, unspecified: Secondary | ICD-10-CM

## 2023-09-24 DIAGNOSIS — D631 Anemia in chronic kidney disease: Secondary | ICD-10-CM | POA: Insufficient documentation

## 2023-09-24 DIAGNOSIS — I2510 Atherosclerotic heart disease of native coronary artery without angina pectoris: Secondary | ICD-10-CM | POA: Insufficient documentation

## 2023-09-24 DIAGNOSIS — Z882 Allergy status to sulfonamides status: Secondary | ICD-10-CM | POA: Diagnosis not present

## 2023-09-24 DIAGNOSIS — Z992 Dependence on renal dialysis: Secondary | ICD-10-CM | POA: Insufficient documentation

## 2023-09-24 DIAGNOSIS — I132 Hypertensive heart and chronic kidney disease with heart failure and with stage 5 chronic kidney disease, or end stage renal disease: Secondary | ICD-10-CM | POA: Insufficient documentation

## 2023-09-24 LAB — CBC WITH DIFFERENTIAL (CANCER CENTER ONLY)
Abs Immature Granulocytes: 0.02 K/uL (ref 0.00–0.07)
Basophils Absolute: 0 K/uL (ref 0.0–0.1)
Basophils Relative: 0 %
Eosinophils Absolute: 0.1 K/uL (ref 0.0–0.5)
Eosinophils Relative: 1 %
HCT: 31.9 % — ABNORMAL LOW (ref 36.0–46.0)
Hemoglobin: 10.4 g/dL — ABNORMAL LOW (ref 12.0–15.0)
Immature Granulocytes: 0 %
Lymphocytes Relative: 26 %
Lymphs Abs: 1.6 K/uL (ref 0.7–4.0)
MCH: 29.7 pg (ref 26.0–34.0)
MCHC: 32.6 g/dL (ref 30.0–36.0)
MCV: 91.1 fL (ref 80.0–100.0)
Monocytes Absolute: 0.8 K/uL (ref 0.1–1.0)
Monocytes Relative: 13 %
Neutro Abs: 3.5 K/uL (ref 1.7–7.7)
Neutrophils Relative %: 60 %
Platelet Count: 142 K/uL — ABNORMAL LOW (ref 150–400)
RBC: 3.5 MIL/uL — ABNORMAL LOW (ref 3.87–5.11)
RDW: 16.3 % — ABNORMAL HIGH (ref 11.5–15.5)
WBC Count: 6 K/uL (ref 4.0–10.5)
nRBC: 0 % (ref 0.0–0.2)

## 2023-09-24 LAB — CMP (CANCER CENTER ONLY)
ALT: 17 U/L (ref 0–44)
AST: 22 U/L (ref 15–41)
Albumin: 4.1 g/dL (ref 3.5–5.0)
Alkaline Phosphatase: 189 U/L — ABNORMAL HIGH (ref 38–126)
Anion gap: 11 (ref 5–15)
BUN: 43 mg/dL — ABNORMAL HIGH (ref 8–23)
CO2: 31 mmol/L (ref 22–32)
Calcium: 9.9 mg/dL (ref 8.9–10.3)
Chloride: 97 mmol/L — ABNORMAL LOW (ref 98–111)
Creatinine: 6.6 mg/dL — ABNORMAL HIGH (ref 0.44–1.00)
GFR, Estimated: 6 mL/min — ABNORMAL LOW (ref >60)
Glucose, Bld: 147 mg/dL — ABNORMAL HIGH (ref 70–99)
Potassium: 4.9 mmol/L (ref 3.5–5.1)
Sodium: 139 mmol/L (ref 135–145)
Total Bilirubin: 0.3 mg/dL (ref 0.0–1.2)
Total Protein: 6.9 g/dL (ref 6.5–8.1)

## 2023-09-24 NOTE — Assessment & Plan Note (Signed)
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

## 2023-09-24 NOTE — Progress Notes (Signed)
 Tiffany [name]   [phone] [phone]   Clinic Follow up Note   Patient Care Team: Tiffany Atlas, Tiffany [name] as PCP - General (Internal Medicine) Tiffany Lurena POUR, Tiffany [name] as PCP - Cardiology (Cardiology) [name], [facility]  Date of Service:  09/24/2023  CHIEF COMPLAINT: f/u of anemia and thrombocytopenia  CURRENT THERAPY:  None  Hematology History   Anemia of chronic disease -secondary to ESRD - No evidence of nutritional anemia or hemolysis -Bone marrow biopsy in 09/24/2023 was unremarkable -Continue monitoring, will consider ESA if her hemoglobin drops below 9 consistently  Thrombocytopenia (HCC) - 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 Platelet count has improved to 142,000/L from previous counts of 86,000/L and 101,000/L. Hemoglobin is slightly improved at 10.4 g/dL. Bone marrow biopsy and molecular testing were normal, ruling out MDS and other primary bone marrow disease. Anemia is likely due to chronic disease and dialysis-related factors. No current need for treatment or transfusion. Discussed potential future need for intervention if hemoglobin consistently falls below 9 g/dL or platelet count drops below 50,000/L. She is aware of how to monitor blood counts and when to seek further evaluation. Dialysis may contribute to mild low platelet count due to blood processing. - Monitor blood counts during dialysis. - Instruct her to call if hemoglobin falls below 9 g/dL or platelet count drops below 50,000/L. - Discuss with nephrologist about potential IV iron or erythropoiesis-stimulating agents during dialysis.    Plan - CBC reviewed,  both anemia and thrombocytopenia has improved, overall mild, no need for treatment. - She will follow-up with her nephrologist for anemia management - I will see her as needed  Discussed the use of AI scribe software for clinical note transcription with the patient, who gave verbal consent to proceed.  History of Present Illness Tiffany [name] is a 73 year old female with thrombocytopenia who presents for follow-up.  She feels well with no bleeding episodes, including epistaxis or bruising. Her platelet count has improved to 142, previously at 86 and 101. Hemoglobin is slightly better at 10.4, and white blood cell count is normal.  A bone marrow biopsy in February showed normal results on additional molecular testing, including an MDS panel.  Her medical history includes chronic kidney disease, type 2 diabetes managed with Lantus  insulin , a heart attack in April 2023, iron deficiency, thyroid  disorder, hypertension, hyperlipidemia, and congestive heart failure. She is on dialysis and receives vitamin D. She is unsure if she receives any medication to maintain her blood counts during dialysis.     All other systems were reviewed with the patient and are negative.  MEDICAL HISTORY:  Past Medical History:  Diagnosis Date   ALLERGIC RHINITIS    Anxiety    Arthritis    CHF (congestive heart failure) (HCC)    CKD (chronic kidney disease)    Dialysis T/Th/Sa   Collagen vascular disease (HCC)    Coronary artery disease    05/12/21 - cath/ stent placement   COVID 01/21/2021   + HOME TEST  HAD COUGH CONGESTION AND MALAISE, OCC CONGESTION NOW   COVID 2022   mild   Heart murmur    at birth   Hyperlipidemia    Hypertension    Hypothyroid  Iron deficiency anemia    Myocardial infarction (HCC) 05/2021   Obesity    OSA on CPAP    no longer on a cpap   Refusal of blood transfusions as patient is Jehovah's Witness    Shingles 11/2020   LEFT BACK   Type 2 dm with insulin  use     checks abg qday runs 125   Wears glasses     SURGICAL HISTORY: Past Surgical History:  Procedure Laterality Date   A/V FISTULAGRAM Left 03/11/2022   Procedure: A/V Fistulagram;  Surgeon: Sheree Penne Bruckner, Tiffany [name];  Location: [facility] INVASIVE CV LAB;  Service: Cardiovascular;  Laterality: Left;   A/V FISTULAGRAM N/A 02/20/2023   Procedure: A/V Fistulagram;  Surgeon: Norine Manuelita LABOR, Tiffany [name];  Location: [facility] INVASIVE CV LAB;  Service: Cardiovascular;  Laterality: N/A;   A/V FISTULAGRAM N/A 03/14/2023   Procedure: A/V Fistulagram;  Surgeon: Melia Lynwood ORN, Tiffany [name];  Location: [facility] INVASIVE CV LAB;  Service: Cardiovascular;  Laterality: N/A;   A/V SHUNT INTERVENTION N/A 05/12/2023   Procedure: A/V SHUNT INTERVENTION;  Surgeon: Tobie Gordy POUR, Tiffany [name];  Location: [name] INVASIVE CV LAB;  Service: Cardiovascular;  Laterality: N/A;   A/V SHUNT INTERVENTION Left 09/12/2023   Procedure: A/V SHUNT INTERVENTION;  Surgeon: Serene Gaile ORN, Tiffany [name];  Location: [facility] PV LAB;  Service: Cardiovascular;  Laterality: Left;   AV FISTULA PLACEMENT Left 01/18/2022   Procedure: LEFT ARM ARTERIOVENOUS (AV) FISTULA;  Surgeon: Sheree Penne Bruckner, Tiffany [name];  Location: [name] OR;  Service: Vascular;  Laterality: Left;   BREAST BIOPSY     left breast per pt; benign 20+ yrs ago   BREAST REDUCTION SURGERY  1982   COLONOSCOPY WITH PROPOFOL  N/A 10/24/2020   Procedure: COLONOSCOPY WITH PROPOFOL ;  Surgeon: Saintclair Jasper, Tiffany [name];  Location: [facility];  Service: Gastroenterology;  Laterality: N/A;   CORONARY STENT INTERVENTION N/A 05/12/2021   Procedure: CORONARY STENT INTERVENTION;  Surgeon: Tiffany Lurena POUR, Tiffany [name];  Location: [facility] INVASIVE CV LAB;  Service: Cardiovascular;  Laterality: N/A;   CORONARY/GRAFT ACUTE MI REVASCULARIZATION N/A 05/12/2021   Procedure: Coronary/Graft Acute MI Revascularization;  Surgeon: Tiffany Lurena POUR, Tiffany [name];  Location: [facility] INVASIVE CV LAB;  Service: Cardiovascular;  Laterality: N/A;   FISTULA SUPERFICIALIZATION Left 04/26/2022   Procedure:  LEFT UPPER ARM BRACHIAL CEPHALIC FISTULA LIGATION W/ ATTEMPTED CEPHALIC VEIN STENTING;  Surgeon: Sheree Penne Bruckner, Tiffany [name];  Location: [name] OR;  Service: Vascular;  Laterality: Left;   GRAFT APPLICATION Left 04/26/2022   Procedure: LEFT UPPER ARM GORE-TEX GRAFT APPLICATION;  Surgeon: Sheree Penne Bruckner, Tiffany [name];  Location[name];  Service: Vascular;  Laterality: Left;   IR FLUORO GUIDE CV LINE RIGHT  10/18/2021   IR US  GUIDE VASC ACCESS RIGHT  10/18/2021   KNEE SURGERY Bilateral    x 2   LEFT HEART CATH AND CORONARY ANGIOGRAPHY N/A 05/12/2021   Procedure: LEFT HEART CATH AND CORONARY ANGIOGRAPHY;  Surgeon: Tiffany Lurena POUR, Tiffany [name];  Location: [facility] INVASIVE CV LAB;  Service: Cardiovascular;  Laterality: N/A;   LUMBAR DISC SURGERY     x 2   PERIPHERAL VASCULAR BALLOON ANGIOPLASTY  03/11/2022   Procedure: PERIPHERAL VASCULAR BALLOON ANGIOPLASTY;  Surgeon: Sheree Penne Bruckner, Tiffany [name];  Location: [facility] INVASIVE CV LAB;  Service: Cardiovascular;;   PERIPHERAL VASCULAR BALLOON ANGIOPLASTY  03/14/2023   Procedure: PERIPHERAL VASCULAR BALLOON ANGIOPLASTY;  Surgeon: Melia Lynwood ORN, Tiffany [name];  Location: [facility] INVASIVE CV LAB;  Service: Cardiovascular;;  Inflow Cephalic Vein   PERIPHERAL VASCULAR INTERVENTION Left 02/20/2023   Procedure: PERIPHERAL VASCULAR INTERVENTION;  Surgeon: Norine Manuelita LABOR, Tiffany [name];  Location: [facility] INVASIVE CV LAB;  Service: Cardiovascular;  Laterality: Left;  va stent   POLYPECTOMY  10/24/2020   Procedure: POLYPECTOMY;  Surgeon: Saintclair Jasper, Tiffany [name];  Location: [facility];  Service: Gastroenterology;;   REDUCTION MAMMAPLASTY Bilateral 1985   THROMBECTOMY BRACHIAL ARTERY  01/18/2022   Procedure: THROMBECTOMY TO LEFT CEPHALIC VEIN;  Surgeon: Sheree Penne Bruckner, Tiffany [name];  Location: [facility] OR;  Service: Vascular;;   TOE SURGERY     yrs ago per pt on 02-02-2021   TOTAL ABDOMINAL HYSTERECTOMY  1997   partial hysterectomy- still has ovaries   TOTAL KNEE ARTHROPLASTY Left 07/01/2019   Procedure: TOTAL KNEE  ARTHROPLASTY;  Surgeon: Ernie Cough, Tiffany [name];  Location: [facility] ORS;  Service: Orthopedics;  Laterality: Left;  70 min NO BLOOD PRODUCTS!!   TREATMENT FISTULA ANAL     yrs ago per pt 0n 02-02-2021   TRIGGER FINGER RELEASE Left 02/08/2021   Procedure: Left Ring trigger finger release, left ring finger volar ganglion cyst excision;  Surgeon: Alyse Agent, Tiffany [name];  Location: [facility];  Service: Orthopedics;  Laterality: Left;  with local anesthesia   VENOUS ANGIOPLASTY Left 05/12/2023   Procedure: VENOUS ANGIOPLASTY;  Surgeon: Tobie Gordy POUR, Tiffany [name];  Location: [name] CV LAB;  Service: Cardiovascular;  Laterality: Left;  70% Inflow Cephalic Vein   VENOUS ANGIOPLASTY  09/12/2023   Procedure: VENOUS ANGIOPLASTY;  Surgeon: Serene Gaile ORN, Tiffany [name];  Location: [facility] LAB;  Service: Cardiovascular;;  anastomosis    I have reviewed the social history and family history with the patient and they are unchanged from previous note.  ALLERGIES:  is allergic to other and sulfa antibiotics.  MEDICATIONS:  Current Outpatient Medications  Medication Sig Dispense Refill   acetaminophen  (TYLENOL ) 650 MG CR tablet Take 650 mg by mouth every 8 (eight) hours as needed for pain.     allopurinol  (ZYLOPRIM ) 100 MG tablet Take 1 tablet (100 mg total) by mouth daily. (Patient taking differently: Take 100 mg by mouth at bedtime.) 30 tablet 0   atorvastatin  (LIPITOR ) 80 MG tablet TAKE 1 TABLET BY MOUTH EVERYDAY AT BEDTIME 90 tablet 3   B Complex-C-Zn-Folic Acid  (DIALYVITE/ZINC) TABS Take 1 tablet by mouth every evening.     clopidogrel  (PLAVIX ) 75 MG tablet TAKE 1 TABLET BY MOUTH EVERY DAY 90 tablet 3   fluticasone  (FLONASE ) 50 MCG/ACT nasal spray Place 1 spray into both nostrils daily as needed for allergies.     hydrALAZINE  (APRESOLINE ) 50 MG tablet TAKE 1 TABLET BY MOUTH TWICE A DAY 180 tablet 3   insulin  glargine (LANTUS ) 100 UNIT/ML injection Inject 10 Units into the skin daily.     levocetirizine (XYZAL) 5 MG  tablet Take 5 mg by mouth in the morning.     levothyroxine  (SYNTHROID ) 112 MCG tablet Take 112 mcg by mouth daily at 2 am.     LORazepam  (ATIVAN ) 1 MG tablet Take 1 mg by mouth daily as needed for anxiety.     metoprolol  succinate (TOPROL  XL) 25 MG 24 hr tablet Take 1.5 tablets (37.5 mg total) by mouth at bedtime. 135 tablet 3   Multiple Vitamins-Minerals (PRESERVISION AREDS 2 PO) Take 1 tablet by mouth in the morning.     nitroGLYCERIN  (NITROSTAT ) 0.4 MG SL tablet PLACE 1 TABLET (0.4 MG TOTAL) UNDER THE TONGUE EVERY 5 (FIVE) MINUTES X 3 DOSES AS NEEDED FOR CHEST PAIN. 25 tablet 7   Omega-3 Fatty Acids (FISH OIL) 1200 MG CAPS Take 1,200  mg by mouth at bedtime.     Polyethyl Glycol-Propyl Glycol (SYSTANE) 0.4-0.3 % SOLN Place 1 drop into both eyes in the morning, at noon, in the evening, and at bedtime.     polyethylene glycol (MIRALAX  / GLYCOLAX ) 17 g packet Take 17 g by mouth daily as needed for mild constipation or moderate constipation.     sevelamer carbonate (RENVELA) 800 MG tablet Take 2,400 mg by mouth 3 (three) times daily with meals.     tirzepatide  (MOUNJARO ) 2.5 MG/0.5ML Pen Inject 2.5 mg into the skin once a week. 2 mL 0   traZODone  (DESYREL ) 150 MG tablet Take 150 mg by mouth at bedtime.     Current Facility-Administered Medications  Medication Dose Route Frequency Provider Last Rate Last Admin   0.9 %  sodium chloride  infusion  250 mL Intravenous PRN Sheree Penne Bruckner, Tiffany [name]       sodium chloride  flush (NS) 0.9 % injection 3 mL  3 mL Intravenous Q12H Sheree Penne Bruckner, Tiffany [name]        PHYSICAL EXAMINATION: ECOG PERFORMANCE STATUS: 0 - Asymptomatic  Vitals:   09/24/23 1429 09/24/23 1443  BP: (!) 104/36 138/82  Pulse:    Resp:    Temp:    SpO2:     Wt Readings from Last 3 Encounters:  09/24/23 224 lb 12.8 oz (102 kg)  05/26/23 234 lb 6.4 oz (106.3 kg)  04/07/23 227 lb 6.4 oz (103.1 kg)     GENERAL:alert, no distress and comfortable SKIN: skin color, texture,  turgor are normal, no rashes or significant lesions EYES: normal, Conjunctiva are pink and non-injected, sclera clear  Musculoskeletal:no cyanosis of digits and no clubbing  NEURO: alert & oriented x 3 with fluent speech, no focal motor/sensory deficits  Physical Exam    LABORATORY DATA:  I have reviewed the data as listed    Latest Ref Rng & Units 09/24/2023    1:58 PM 03/06/2023    9:11 AM 02/17/2023    1:23 PM  CBC  WBC 4.0 - 10.5 K/uL 6.0  4.6  4.5   Hemoglobin 12.0 - 15.0 g/dL 89.5  89.2  9.9   Hematocrit 36.0 - 46.0 % 31.9  33.2  34.3    30.8   Platelets 150 - 400 K/uL 142  101  86         Latest Ref Rng & Units 09/24/2023    1:58 PM 04/07/2023   10:49 AM 08/12/2022    4:08 PM  CMP  Glucose 70 - 99 mg/dL 852   765   BUN 8 - 23 mg/dL 43   52   Creatinine 9.55 - 1.00 mg/dL 3.39   3.92   Sodium 864 - 145 mmol/L 139   139   Potassium 3.5 - 5.1 mmol/L 4.9   5.1   Chloride 98 - 111 mmol/L 97   98   CO2 22 - 32 mmol/L 31   28   Calcium  8.9 - 10.3 mg/dL 9.9   9.5   Total Protein 6.5 - 8.1 g/dL 6.9  6.0  6.3   Total Bilirubin 0.0 - 1.2 mg/dL 0.3  0.2  0.4   Alkaline Phos 38 - 126 U/L 189  205    AST 15 - 41 U/L 22  24  17    ALT 0 - 44 U/L 17  23  14        RADIOGRAPHIC STUDIES: I have personally reviewed the radiological images as listed and agreed with the findings  in the report. No results found.    No orders of the defined types were placed in this encounter.  All questions were answered. The patient knows to call the clinic with any problems, questions or concerns. No barriers to learning was detected. The total time spent in the appointment was 15 minutes, including review of chart and various tests results, discussions about plan of care and coordination of care plan     Onita Mattock, Tiffany [name] 09/24/2023

## 2023-09-24 NOTE — Assessment & Plan Note (Signed)
-  secondary to ESRD - No evidence of nutritional anemia or hemolysis -Bone marrow biopsy in 09/24/2023 was unremarkable -Continue monitoring, will consider ESA if her hemoglobin drops below 9 consistently

## 2023-10-03 ENCOUNTER — Ambulatory Visit

## 2023-10-24 ENCOUNTER — Telehealth: Payer: Self-pay | Admitting: Internal Medicine

## 2023-10-24 DIAGNOSIS — E1169 Type 2 diabetes mellitus with other specified complication: Secondary | ICD-10-CM

## 2023-10-24 NOTE — Telephone Encounter (Signed)
 *  STAT* If patient is at the pharmacy, call can be transferred to refill team.   1. Which medications need to be refilled? (please list name of each medication and dose if known)   tirzepatide  (MOUNJARO ) 2.5 MG/0.5ML Pen   2. Which pharmacy/location (including street and city if local pharmacy) is medication to be sent to?  [id number] - [place], [place] - [address]    3. Do they need a 30 day or 90 day supply? 30

## 2023-10-30 MED ORDER — MOUNJARO 2.5 MG/0.5ML ~~LOC~~ SOAJ: 2.5000 mg | SUBCUTANEOUS | 0 refills | Status: DC

## 2023-11-19 ENCOUNTER — Ambulatory Visit
Admission: RE | Admit: 2023-11-19 | Discharge: 2023-11-19 | Disposition: A | Discharge status: Home / Self Care | Source: Ambulatory Visit | Attending: Internal Medicine | Admitting: Internal Medicine

## 2023-11-19 DIAGNOSIS — Z1231 Encounter for screening mammogram for malignant neoplasm of breast: Secondary | ICD-10-CM

## 2023-11-28 ENCOUNTER — Other Ambulatory Visit: Payer: Self-pay | Admitting: Internal Medicine

## 2023-11-28 DIAGNOSIS — E1169 Type 2 diabetes mellitus with other specified complication: Secondary | ICD-10-CM

## 2023-11-28 NOTE — Telephone Encounter (Signed)
 Refill Request.

## 2023-12-03 NOTE — Telephone Encounter (Signed)
 Spoke with patient who would like to increase to 5mg . Rx sent

## 2023-12-08 ENCOUNTER — Encounter: Payer: Self-pay | Admitting: Radiology

## 2023-12-17 ENCOUNTER — Telehealth (HOSPITAL_BASED_OUTPATIENT_CLINIC_OR_DEPARTMENT_OTHER): Payer: Self-pay

## 2023-12-17 NOTE — Telephone Encounter (Signed)
   Pre-operative Risk Assessment    Patient Name: Tiffany [name]  DOB: Sep 20, 1950 MRN: [id number]   Date of last office visit: 05/26/23 Date of next office visit: Not scheduled   Request for Surgical Clearance    Procedure:  Colonoscopy/ endoscopy  Date of Surgery:  Clearance 02/09/24                                Surgeon:  Dr. Estelita Manas Surgeon's Group or Practice Name:  [facility] Phone number:  [phone] Fax number:  [phone]   Type of Clearance Requested:   - Medical  - Pharmacy:  Hold Clopidogrel  (Plavix )     Type of Anesthesia:  Propofol    Additional requests/questions:    Bonney Ival LOISE Gerome   12/17/2023, 4:23 PM

## 2023-12-18 NOTE — Telephone Encounter (Signed)
 Left message to call back to schedule IN OFFICE PRE OP APPT.

## 2023-12-18 NOTE — Telephone Encounter (Signed)
    Primary Cardiologist:Arun MARLA Red, MD  Chart reviewed as part of pre-operative protocol coverage. Because of Tiffany [name]'s past medical history and time since last visit, he/she will require a follow-up Office visit in order to better assess preoperative cardiovascular risk.  Pre-op  covering staff: - Please schedule appointment and call patient to inform them. - Please contact requesting surgeon's office via preferred method (i.e, phone, fax) to inform them of need for appointment prior to surgery.  If applicable, this message will also be routed to pharmacy pool and/or primary cardiologist for input on holding anticoagulant/antiplatelet agent as requested below so that this information is available at time of patient's appointment.   Tiffany CHRISTELLA Beauvais, NP  12/18/2023, 2:36 PM

## 2023-12-22 NOTE — Telephone Encounter (Signed)
 Pt has appt 12/31/23 with Orren Fabry, PAC for preop clearance.

## 2023-12-22 NOTE — Telephone Encounter (Signed)
 Patient scheduled with Orren Fabry for 11/26 at 1:55pm, patient aware of office location.

## 2023-12-30 ENCOUNTER — Encounter (HOSPITAL_COMMUNITY): Payer: Self-pay | Admitting: Vascular Surgery

## 2023-12-31 ENCOUNTER — Ambulatory Visit: Admitting: Physician Assistant

## 2024-01-06 ENCOUNTER — Encounter (HOSPITAL_COMMUNITY): Payer: Self-pay

## 2024-01-09 ENCOUNTER — Other Ambulatory Visit: Payer: Self-pay | Admitting: Internal Medicine

## 2024-01-09 ENCOUNTER — Other Ambulatory Visit: Payer: Self-pay

## 2024-01-09 ENCOUNTER — Ambulatory Visit (HOSPITAL_COMMUNITY)
Admission: RE | Admit: 2024-01-09 | Discharge: 2024-01-09 | Disposition: A | Attending: Vascular Surgery | Admitting: Vascular Surgery

## 2024-01-09 ENCOUNTER — Encounter (HOSPITAL_COMMUNITY): Payer: Self-pay | Admitting: Vascular Surgery

## 2024-01-09 ENCOUNTER — Encounter (HOSPITAL_COMMUNITY): Admission: RE | Disposition: A | Payer: Self-pay | Source: Home / Self Care | Attending: Vascular Surgery

## 2024-01-09 DIAGNOSIS — E1169 Type 2 diabetes mellitus with other specified complication: Secondary | ICD-10-CM

## 2024-01-09 HISTORY — PX: VENOUS ANGIOPLASTY: CATH118376

## 2024-01-09 HISTORY — PX: A/V FISTULAGRAM: CATH118298

## 2024-01-09 LAB — GLUCOSE, CAPILLARY: Glucose-Capillary: 103 mg/dL — ABNORMAL HIGH (ref 70–99)

## 2024-01-09 SURGERY — A/V FISTULAGRAM
Anesthesia: LOCAL | Site: Arm Upper | Laterality: Left

## 2024-01-09 MED ORDER — HEPARIN (PORCINE) IN NACL 1000-0.9 UT/500ML-% IV SOLN
INTRAVENOUS | Status: DC | PRN
  Administered 2024-01-09: 500 mL

## 2024-01-09 MED ORDER — LIDOCAINE HCL (PF) 1 % IJ SOLN
INTRAMUSCULAR | Status: DC | PRN
  Administered 2024-01-09 (×4): 2 mL via SUBCUTANEOUS

## 2024-01-09 MED ORDER — IODIXANOL 320 MG/ML IV SOLN
INTRAVENOUS | Status: DC | PRN
  Administered 2024-01-09: 45 mL via INTRAVENOUS

## 2024-01-09 MED ORDER — LIDOCAINE HCL (PF) 1 % IJ SOLN
INTRAMUSCULAR | Status: AC
  Filled 2024-01-09: qty 30

## 2024-01-09 SURGICAL SUPPLY — 11 items
BALLOON MUSTANG 6X60X75 (BALLOONS) IMPLANT
BALLOON MUSTANG 8X100X75 (BALLOONS) IMPLANT
DEVICE INFLATION ENCORE 26 (MISCELLANEOUS) IMPLANT
KIT MICROPUNCTURE NIT STIFF (SHEATH) IMPLANT
SHEATH PINNACLE R/O II 6F 4CM (SHEATH) IMPLANT
SHEATH PROBE COVER 6X72 (BAG) IMPLANT
STOPCOCK MORSE 400PSI 3WAY (MISCELLANEOUS) IMPLANT
TRAY PV CATH (CUSTOM PROCEDURE TRAY) ×2 IMPLANT
TUBING CIL FLEX 10 FLL-RA (TUBING) IMPLANT
WIRE BENTSON .035X145CM (WIRE) IMPLANT
WIRE TORQFLEX AUST .018X40CM (WIRE) IMPLANT

## 2024-01-09 NOTE — H&P (Signed)
 H+P  History of Present Illness: This is a 73 y.o. female history of end-stage renal disease on dialysis via left arm AV fistula with interposition graft and stents.  She underwent intervention 4 months ago due to decreased flow rates but states that the fistula was working well after intervention.  She is again here with increased flow rates on dialysis and to discuss options moving forward.  Past Medical History:  Diagnosis Date   ALLERGIC RHINITIS    Anxiety    Arthritis    CHF (congestive heart failure) (HCC)    CKD (chronic kidney disease)    Dialysis T/Th/Sa   Collagen vascular disease    Coronary artery disease    05/12/21 - cath/ stent placement   COVID 01/21/2021   + HOME TEST  HAD COUGH CONGESTION AND MALAISE, OCC CONGESTION NOW   COVID 2022   mild   Heart murmur    at birth   Hyperlipidemia    Hypertension    Hypothyroid    Iron deficiency anemia    Myocardial infarction (HCC) 05/2021   Obesity    OSA on CPAP    no longer on a cpap   Refusal of blood transfusions as patient is Jehovah's Witness    Shingles 11/2020   LEFT BACK   Type 2 dm with insulin  use    checks abg qday runs 125   Wears glasses     Past Surgical History:  Procedure Laterality Date   A/V FISTULAGRAM Left 03/11/2022   Procedure: A/V Fistulagram;  Surgeon: Sheree Penne Bruckner, MD;  Location: [facility] INVASIVE CV LAB;  Service: Cardiovascular;  Laterality: Left;   A/V FISTULAGRAM N/A 02/20/2023   Procedure: A/V Fistulagram;  Surgeon: Norine Manuelita LABOR, MD;  Location: [facility] INVASIVE CV LAB;  Service: Cardiovascular;  Laterality: N/A;   A/V FISTULAGRAM N/A 03/14/2023   Procedure: A/V Fistulagram;  Surgeon: Melia Lynwood ORN, MD;  Location: [facility] INVASIVE CV LAB;  Service: Cardiovascular;  Laterality: N/A;   A/V SHUNT INTERVENTION N/A 05/12/2023   Procedure: A/V SHUNT INTERVENTION;  Surgeon: Tobie Gordy POUR, MD;  Location: [facility] INVASIVE CV LAB;  Service: Cardiovascular;  Laterality: N/A;   A/V SHUNT INTERVENTION  Left 09/12/2023   Procedure: A/V SHUNT INTERVENTION;  Surgeon: Serene Gaile ORN, MD;  Location: [facility] PV LAB;  Service: Cardiovascular;  Laterality: Left;   AV FISTULA PLACEMENT Left 01/18/2022   Procedure: LEFT ARM ARTERIOVENOUS (AV) FISTULA;  Surgeon: Sheree Penne Bruckner, MD;  Location: [facility] OR;  Service: Vascular;  Laterality: Left;   BREAST BIOPSY     left breast per pt; benign 20+ yrs ago   BREAST REDUCTION SURGERY  1982   COLONOSCOPY WITH PROPOFOL  N/A 10/24/2020   Procedure: COLONOSCOPY WITH PROPOFOL ;  Surgeon: Saintclair Jasper, MD;  Location: [facility] ENDOSCOPY;  Service: Gastroenterology;  Laterality: N/A;   CORONARY STENT INTERVENTION N/A 05/12/2021   Procedure: CORONARY STENT INTERVENTION;  Surgeon: Wendel Lurena POUR, MD;  Location: [facility] INVASIVE CV LAB;  Service: Cardiovascular;  Laterality: N/A;   CORONARY/GRAFT ACUTE MI REVASCULARIZATION N/A 05/12/2021   Procedure: Coronary/Graft Acute MI Revascularization;  Surgeon: Wendel Lurena POUR, MD;  Location: [facility] INVASIVE CV LAB;  Service: Cardiovascular;  Laterality: N/A;   FISTULA SUPERFICIALIZATION Left 04/26/2022   Procedure: LEFT UPPER ARM BRACHIAL CEPHALIC FISTULA LIGATION W/ ATTEMPTED CEPHALIC VEIN STENTING;  Surgeon: Sheree Penne Bruckner, MD;  Location: [facility] OR;  Service: Vascular;  Laterality: Left;   GRAFT APPLICATION Left 04/26/2022   Procedure: LEFT UPPER ARM GORE-TEX GRAFT APPLICATION;  Surgeon: Sheree Penne Bruckner, MD;  Location: [facility] OR;  Service: Vascular;  Laterality: Left;   IR FLUORO GUIDE CV LINE RIGHT  10/18/2021   IR US  GUIDE VASC ACCESS RIGHT  10/18/2021   KNEE SURGERY Bilateral    x 2   LEFT HEART CATH AND CORONARY ANGIOGRAPHY N/A 05/12/2021   Procedure: LEFT HEART CATH AND CORONARY ANGIOGRAPHY;  Surgeon: Wendel Lurena POUR, MD;  Location: [facility] INVASIVE CV LAB;  Service: Cardiovascular;  Laterality: N/A;   LUMBAR DISC SURGERY     x 2   PERIPHERAL VASCULAR BALLOON ANGIOPLASTY  03/11/2022   Procedure: PERIPHERAL VASCULAR  BALLOON ANGIOPLASTY;  Surgeon: Sheree Penne Bruckner, MD;  Location: [facility] INVASIVE CV LAB;  Service: Cardiovascular;;   PERIPHERAL VASCULAR BALLOON ANGIOPLASTY  03/14/2023   Procedure: PERIPHERAL VASCULAR BALLOON ANGIOPLASTY;  Surgeon: Melia Lynwood ORN, MD;  Location: [facility] INVASIVE CV LAB;  Service: Cardiovascular;;  Inflow Cephalic Vein   PERIPHERAL VASCULAR INTERVENTION Left 02/20/2023   Procedure: PERIPHERAL VASCULAR INTERVENTION;  Surgeon: Norine Manuelita LABOR, MD;  Location: [facility] INVASIVE CV LAB;  Service: Cardiovascular;  Laterality: Left;  va stent   POLYPECTOMY  10/24/2020   Procedure: POLYPECTOMY;  Surgeon: Saintclair Jasper, MD;  Location: [facility];  Service: Gastroenterology;;   REDUCTION MAMMAPLASTY Bilateral 1985   THROMBECTOMY BRACHIAL ARTERY  01/18/2022   Procedure: THROMBECTOMY TO LEFT CEPHALIC VEIN;  Surgeon: Sheree Penne Bruckner, MD;  Location: [facility] OR;  Service: Vascular;;   TOE SURGERY     yrs ago per pt on 02-02-2021   TOTAL ABDOMINAL HYSTERECTOMY  1997   partial hysterectomy- still has ovaries   TOTAL KNEE ARTHROPLASTY Left 07/01/2019   Procedure: TOTAL KNEE ARTHROPLASTY;  Surgeon: Ernie Cough, MD;  Location: [facility] ORS;  Service: Orthopedics;  Laterality: Left;  70 min NO BLOOD PRODUCTS!!   TREATMENT FISTULA ANAL     yrs ago per pt 0n 02-02-2021   TRIGGER FINGER RELEASE Left 02/08/2021   Procedure: Left Ring trigger finger release, left ring finger volar ganglion cyst excision;  Surgeon: Alyse Lynwood, MD;  Location: [facility];  Service: Orthopedics;  Laterality: Left;  with local anesthesia   VENOUS ANGIOPLASTY Left 05/12/2023   Procedure: VENOUS ANGIOPLASTY;  Surgeon: Tobie Gordy POUR, MD;  Location: [facility] INVASIVE CV LAB;  Service: Cardiovascular;  Laterality: Left;  70% Inflow Cephalic Vein   VENOUS ANGIOPLASTY  09/12/2023   Procedure: VENOUS ANGIOPLASTY;  Surgeon: Serene Gaile ORN, MD;  Location: [facility];  Service: Cardiovascular;;  anastomosis    Allergies   Allergen Reactions   Other     No blood products   Sulfa Antibiotics Rash    Prior to Admission medications   Medication Sig Start Date End Date Taking? Authorizing Provider  acetaminophen  (TYLENOL ) 650 MG CR tablet Take 650 mg by mouth every 8 (eight) hours as needed for pain.    [provider]  allopurinol  (ZYLOPRIM ) 100 MG tablet Take 1 tablet (100 mg total) by mouth daily. Patient taking differently: Take 100 mg by mouth at bedtime. 10/26/21 05/08/23  Arrien, Mauricio Daniel, MD  atorvastatin  (LIPITOR ) 80 MG tablet TAKE 1 TABLET BY MOUTH EVERYDAY AT BEDTIME 04/29/23   Thukkani, Arun K, MD  B Complex-C-Zn-Folic Acid  (DIALYVITE/ZINC) TABS Take 1 tablet by mouth every evening. 11/30/21   [provider]  clopidogrel  (PLAVIX ) 75 MG tablet TAKE 1 TABLET BY MOUTH EVERY DAY 04/21/23   Thukkani, Arun K, MD  fluticasone  (FLONASE ) 50 MCG/ACT nasal spray Place 1 spray into both nostrils  daily as needed for allergies. 08/21/21   [provider]  hydrALAZINE  (APRESOLINE ) 50 MG tablet TAKE 1 TABLET BY MOUTH TWICE A DAY 04/16/23   Thukkani, Arun K, MD  insulin  glargine (LANTUS ) 100 UNIT/ML injection Inject 10 Units into the skin daily.    [provider]  levocetirizine (XYZAL) 5 MG tablet Take 5 mg by mouth in the morning. 02/15/22   [provider]  levothyroxine  (SYNTHROID ) 112 MCG tablet Take 112 mcg by mouth daily at 2 am.    [provider]  LORazepam  (ATIVAN ) 1 MG tablet Take 1 mg by mouth daily as needed for anxiety. 11/30/21   [provider]  metoprolol  succinate (TOPROL  XL) 25 MG 24 hr tablet Take 1.5 tablets (37.5 mg total) by mouth at bedtime. 04/07/23   Thukkani, Arun K, MD  Multiple Vitamins-Minerals (PRESERVISION AREDS 2 PO) Take 1 tablet by mouth in the morning.    [provider]  nitroGLYCERIN  (NITROSTAT ) 0.4 MG SL tablet PLACE 1 TABLET (0.4 MG TOTAL) UNDER THE TONGUE EVERY 5 (FIVE) MINUTES X 3 DOSES AS NEEDED FOR CHEST  PAIN. 09/23/22   Thukkani, Arun K, MD  Omega-3 Fatty Acids (FISH OIL) 1200 MG CAPS Take 1,200 mg by mouth at bedtime.    [provider]  Polyethyl Glycol-Propyl Glycol (SYSTANE) 0.4-0.3 % SOLN Place 1 drop into both eyes in the morning, at noon, in the evening, and at bedtime.    [provider]  polyethylene glycol (MIRALAX  / GLYCOLAX ) 17 g packet Take 17 g by mouth daily as needed for mild constipation or moderate constipation.    [provider]  sevelamer carbonate (RENVELA) 800 MG tablet Take 2,400 mg by mouth 3 (three) times daily with meals. 01/29/22   [provider]  tirzepatide  (MOUNJARO ) 5 MG/0.5ML Pen Inject 5 mg into the skin once a week. 12/03/23   Thukkani, Arun K, MD  traZODone  (DESYREL ) 150 MG tablet Take 150 mg by mouth at bedtime.    [provider]    Social History   Socioeconomic History   Marital status: Single    Spouse name: Not on file   Number of children: 2   Years of education: Not on file   Highest education level: Not on file  Occupational History   Occupation: school bus driver  Tobacco Use   Smoking status: Never   Smokeless tobacco: Never  Vaping Use   Vaping status: Never Used  Substance and Sexual Activity   Alcohol  use: Not Currently   Drug use: No   Sexual activity: Not Currently    Birth control/protection: Surgical  Other Topics Concern   Not on file  Social History Narrative   Not on file   Social Drivers of Health   Financial Resource Strain: Low Risk  (04/18/2023)   Received from [facility]   Overall Financial Resource Strain (CARDIA)    Difficulty of Paying Living Expenses: Not hard at all  Food Insecurity: No Food Insecurity (04/18/2023)   Received from [facility]   Hunger Vital Sign    Within the past 12 months, you worried that your food would run out before you got the money to buy more.: Never true    Within the past 12 months, the food you bought just didn't last and you  didn't have money to get more.: Never true  Transportation Needs: No Transportation Needs (04/18/2023)   Received from [facility] - Transportation    Lack of  Transportation (Medical): No    Lack of Transportation (Non-Medical): No  Physical Activity: Unknown (04/18/2023)   Received from [facility]   Exercise Vital Sign    On average, how many days per week do you engage in moderate to strenuous exercise (like a brisk walk)?: 0 days    Minutes of Exercise per Session: Not on file  Stress: No Stress Concern Present (04/18/2023)   Received from [facility] - Occupational Stress Questionnaire    Feeling of Stress : Not at all  Social Connections: Moderately Integrated (04/18/2023)   Received from [facility]   Social Network    How would you rate your social network (family, work, friends)?: Adequate participation with social networks  Intimate Partner Violence: Not At Risk (04/18/2023)   Received from [facility]   HITS    Over the last 12 months how often did your partner physically hurt you?: Never    Over the last 12 months how often did your partner insult you or talk down to you?: Never    Over the last 12 months how often did your partner threaten you with physical harm?: Never    Over the last 12 months how often did your partner scream or curse at you?: Never     Family History  Problem Relation Age of Onset   Prostate cancer Father    Congestive Heart Failure Mother    Diabetes Sister    Other Sister        septis   Breast cancer Neg Hx     ROS: [x]  Positive   [ ]  Negative   [ ]  All sytems reviewed and are negative  Cardiovascular: []  chest pain/pressure []  palpitations []  SOB lying flat []  DOE []  pain in legs while walking []  pain in legs at rest []  pain in legs at night []  non-healing ulcers []  hx of DVT []  swelling in legs  Pulmonary: []  productive cough []  asthma/wheezing []  home  O2  Neurologic: []  weakness in []  arms []  legs []  numbness in []  arms []  legs []  hx of CVA []  mini stroke [] difficulty speaking or slurred speech []  temporary loss of vision in one eye []  dizziness  Hematologic: []  hx of cancer []  bleeding problems []  problems with blood clotting easily  Endocrine:   []  diabetes []  thyroid  disease  GI []  vomiting blood []  blood in stool  GU: []  CKD/renal failure []  HD--[]  M/W/F or []  T/T/S []  burning with urination [X]  Decreased flow rates on dialysis  Psychiatric: []  anxiety []  depression  Musculoskeletal: []  arthritis []  joint pain  Integumentary: []  rashes []  ulcers  Constitutional: []  fever []  chills   Physical Examination  Vitals:   01/09/24 0845  BP: 109/60  Pulse: 72  Resp: 12  Temp: 97.7 F (36.5 C)  SpO2: 94%   There is no height or weight on file to calculate BMI.  Physical Exam HENT:     Head: Normocephalic.     Nose: Nose normal.  Eyes:     Pupils: Pupils are equal, round, and reactive to light.  Cardiovascular:     Rate and Rhythm: Normal rate.  Pulmonary:     Effort: Pulmonary effort is normal.  Abdominal:     General: Abdomen is flat.  Musculoskeletal:     Cervical back: Normal range of motion.     Comments: Left upper arm AV fistula with weak pulsatility  Neurological:     General:  No focal deficit present.     Mental Status: She is alert.  Psychiatric:        Mood and Affect: Mood normal.      CBC    Component Value Date/Time   WBC 6.0 09/24/2023 1358   WBC 4.7 08/12/2022 1608   RBC 3.50 (L) 09/24/2023 1358   HGB 10.4 (L) 09/24/2023 1358   HGB 10.3 (L) 01/25/2020 1506   HCT 31.9 (L) 09/24/2023 1358   HCT 34.3 02/17/2023 1323   PLT 142 (L) 09/24/2023 1358   PLT 186 01/25/2020 1506   MCV 91.1 09/24/2023 1358   MCV 85 01/25/2020 1506   MCH 29.7 09/24/2023 1358   MCHC 32.6 09/24/2023 1358   RDW 16.3 (H) 09/24/2023 1358   RDW 17.6 (H) 01/25/2020 1506   LYMPHSABS 1.6  09/24/2023 1358   MONOABS 0.8 09/24/2023 1358   EOSABS 0.1 09/24/2023 1358   BASOSABS 0.0 09/24/2023 1358    BMET    Component Value Date/Time   NA 139 09/24/2023 1358   NA 137 01/25/2020 1506   K 4.9 09/24/2023 1358   CL 97 (L) 09/24/2023 1358   CO2 31 09/24/2023 1358   GLUCOSE 147 (H) 09/24/2023 1358   BUN 43 (H) 09/24/2023 1358   BUN 108 (HH) 01/25/2020 1506   CREATININE 6.60 (H) 09/24/2023 1358   CREATININE 6.07 (H) 08/12/2022 1608   CALCIUM  9.9 09/24/2023 1358   CALCIUM  10.1 10/09/2021 1052   GFRNONAA 6 (L) 09/24/2023 1358   GFRAA 18 (L) 01/25/2020 1506    COAGS: Lab Results  Component Value Date   INR 1.1 05/11/2021   INR 1.0 05/05/2008     Non-Invasive Vascular Imaging:   No new studies  ASSESSMENT/PLAN: This is a 73 y.o. female with left upper arm cephalic vein fistula that was transposed and has an interposition graft as well as stenting but has worked for dialysis although has required multiple percutaneous interventions the last 1 performed 4 months ago did improve the flow.  She is again here with low clearance rates on dialysis to discuss options moving forward.  We have discussed placing a catheter versus converting to an upper arm graft versus fistulogram with possible intervention.  This time patient is hopeful to salvage this fistula with intervention.  We discussed the risk benefits alternatives of the procedure she demonstrates good understanding and agrees to proceed.  Love Milbourne C. Sheree, MD [facility] [place] Office: [phone] Pager: [phone]

## 2024-01-09 NOTE — Op Note (Signed)
    Patient name: Tiffany [name] MRN: [id number] DOB: 25-Jan-1951 Sex: female  01/09/2024 Pre-operative Diagnosis: End-stage renal disease, malfunctioning left arm AV fistula/graft with decreased flow rates on dialysis Post-operative diagnosis:  Same Surgeon:  Penne BROCKS. Sheree, MD Procedure Performed: 1.  Percutaneous ultrasound-guided access to left upper arm AV shunt and antegrade and retrograde directions 2.  Plain balloon angioplasty in-stent restenosis left arm AV shunt with 8 mm balloon 3.  Plain balloon angioplasty proximal AV fistula recurrent stenosis with 6 mm balloon  Indications: 73 year old female with history of left upper arm AV fistula which was transposed and converted to interposition graft and also the outflow was stented.  She has undergone previous percutaneous interventions most recently 3 months ago, now having decreased clearance on dialysis.  We discussed her options that she has elected for shuntogram with possible intervention.  Findings: The outflow stents had 3 areas of stenosis the greatest measuring approximately 80% of the 2 measuring approximately 60% and these were ballooned with 8 mm Mustang to less than 20% residual stenosis.  The inflow residual cephalic vein had a smooth tapered 4 cm area of 60% stenosis and this was ballooned with a 6 mm Mustang to 0% residual.  At completion there was a much stronger thrill throughout the fistula graft and into the stents.  I discussed with the patient that if she has continued need for endovascular invention in short intervals she would need consideration of conversion to an upper arm graft.   Procedure:  The patient was identified in the holding area and taken to the heart and vascular procedure room.  The patient was then placed supine on the table and prepped and draped in the usual sterile fashion.  A time out was called.  Ultrasound was used to evaluate the left arm access at the graft site.  The area in the mid upper arm  was anesthetized 1% lidocaine  and cannulated with micropuncture needle followed by wire and sheath.  Ultrasound images saved the permanent record.  We performed left upper extremity shuntogram with the above findings of outflow in-stent restenosis we elected for intervention.  A Bentson wire was placed followed by a 6 French sheath.  We performed balloon angioplasty in multiple areas with an 8 mm balloon and completion demonstrated less than 20% residual stenosis with improved flow by palpation.  The wire and sheath were removed and the cannulation site was suture-ligated.  We turned our attention towards the inflow of the access.  Again we anesthetized the skin overlying the graft portion and cannulated in a retrograde direction with micropuncture needle followed by wire and sheath and a Bentson was placed followed by a 6 French sheath.  We were able to select the brachial artery and then performed plain balloon angioplasty of the inflow vein with 6 mm balloon.  Completion demonstrated no residual stenosis and there was a much stronger thrill in the shunt.  Satisfied with this we removed the wire and sheath and suture-ligated the cannulation site again.  She did tolerate the procedure without Ameeth complication.  Contrast: 45 cc   Latrise Bowland C. Sheree, MD [facility] [place] Office: [phone] Pager: [phone]

## 2024-01-12 NOTE — Progress Notes (Unsigned)
 Cardiology Clinic Note   Patient Name: Tiffany [name] Date of Encounter: 01/12/2024  Primary Care Provider:  Shelda Atlas, MD Primary Cardiologist:  Arun K Thukkani, MD  Patient Profile    Tiffany [name] 73 year old female presents to the clinic today for follow-up evaluation of her CHF and preoperative cardiac evaluation.  Past Medical History    Past Medical History:  Diagnosis Date   ALLERGIC RHINITIS    Anxiety    Arthritis    CHF (congestive heart failure) (HCC)    CKD (chronic kidney disease)    Dialysis T/Th/Sa   Collagen vascular disease    Coronary artery disease    05/12/21 - cath/ stent placement   COVID 01/21/2021   + HOME TEST  HAD COUGH CONGESTION AND MALAISE, OCC CONGESTION NOW   COVID 2022   mild   Heart murmur    at birth   Hyperlipidemia    Hypertension    Hypothyroid    Iron deficiency anemia    Myocardial infarction (HCC) 05/2021   Obesity    OSA on CPAP    no longer on a cpap   Refusal of blood transfusions as patient is Jehovah's Witness    Shingles 11/2020   LEFT BACK   Type 2 dm with insulin  use    checks abg qday runs 125   Wears glasses    Past Surgical History:  Procedure Laterality Date   A/V FISTULAGRAM Left 03/11/2022   Procedure: A/V Fistulagram;  Surgeon: Sheree Penne Bruckner, MD;  Location: [facility] INVASIVE CV LAB;  Service: Cardiovascular;  Laterality: Left;   A/V FISTULAGRAM N/A 02/20/2023   Procedure: A/V Fistulagram;  Surgeon: Norine Manuelita LABOR, MD;  Location: [facility] INVASIVE CV LAB;  Service: Cardiovascular;  Laterality: N/A;   A/V FISTULAGRAM N/A 03/14/2023   Procedure: A/V Fistulagram;  Surgeon: Melia Lynwood ORN, MD;  Location: [facility] INVASIVE CV LAB;  Service: Cardiovascular;  Laterality: N/A;   A/V FISTULAGRAM Left 01/09/2024   Procedure: A/V Fistulagram;  Surgeon: Sheree Penne Bruckner, MD;  Location: [facility] PV LAB;  Service: Cardiovascular;  Laterality: Left;   A/V SHUNT INTERVENTION N/A 05/12/2023   Procedure: A/V  SHUNT INTERVENTION;  Surgeon: Tobie Gordy POUR, MD;  Location: [facility] INVASIVE CV LAB;  Service: Cardiovascular;  Laterality: N/A;   A/V SHUNT INTERVENTION Left 09/12/2023   Procedure: A/V SHUNT INTERVENTION;  Surgeon: Serene Gaile ORN, MD;  Location: [facility] PV LAB;  Service: Cardiovascular;  Laterality: Left;   AV FISTULA PLACEMENT Left 01/18/2022   Procedure: LEFT ARM ARTERIOVENOUS (AV) FISTULA;  Surgeon: Sheree Penne Bruckner, MD;  Location: [facility] OR;  Service: Vascular;  Laterality: Left;   BREAST BIOPSY     left breast per pt; benign 20+ yrs ago   BREAST REDUCTION SURGERY  1982   COLONOSCOPY WITH PROPOFOL  N/A 10/24/2020   Procedure: COLONOSCOPY WITH PROPOFOL ;  Surgeon: Saintclair Jasper, MD;  Location: [facility] ENDOSCOPY;  Service: Gastroenterology;  Laterality: N/A;   CORONARY STENT INTERVENTION N/A 05/12/2021   Procedure: CORONARY STENT INTERVENTION;  Surgeon: Wendel Lurena POUR, MD;  Location: [facility] INVASIVE CV LAB;  Service: Cardiovascular;  Laterality: N/A;   CORONARY/GRAFT ACUTE MI REVASCULARIZATION N/A 05/12/2021   Procedure: Coronary/Graft Acute MI Revascularization;  Surgeon: Wendel Lurena POUR, MD;  Location: [facility] INVASIVE CV LAB;  Service: Cardiovascular;  Laterality: N/A;   FISTULA SUPERFICIALIZATION Left 04/26/2022   Procedure: LEFT UPPER ARM BRACHIAL CEPHALIC FISTULA LIGATION W/ ATTEMPTED CEPHALIC VEIN STENTING;  Surgeon: Sheree Penne Bruckner, MD;  Location: [facility] OR;  Service: Vascular;  Laterality: Left;   GRAFT APPLICATION Left 04/26/2022   Procedure: LEFT UPPER ARM GORE-TEX GRAFT APPLICATION;  Surgeon: Sheree Penne Bruckner, MD;  Location: [facility] OR;  Service: Vascular;  Laterality: Left;   IR FLUORO GUIDE CV LINE RIGHT  10/18/2021   IR US  GUIDE VASC ACCESS RIGHT  10/18/2021   KNEE SURGERY Bilateral    x 2   LEFT HEART CATH AND CORONARY ANGIOGRAPHY N/A 05/12/2021   Procedure: LEFT HEART CATH AND CORONARY ANGIOGRAPHY;  Surgeon: Wendel Lurena POUR, MD;  Location: [facility] INVASIVE CV LAB;  Service:  Cardiovascular;  Laterality: N/A;   LUMBAR DISC SURGERY     x 2   PERIPHERAL VASCULAR BALLOON ANGIOPLASTY  03/11/2022   Procedure: PERIPHERAL VASCULAR BALLOON ANGIOPLASTY;  Surgeon: Sheree Penne Bruckner, MD;  Location: [facility] INVASIVE CV LAB;  Service: Cardiovascular;;   PERIPHERAL VASCULAR BALLOON ANGIOPLASTY  03/14/2023   Procedure: PERIPHERAL VASCULAR BALLOON ANGIOPLASTY;  Surgeon: Melia Lynwood ORN, MD;  Location: [facility] INVASIVE CV LAB;  Service: Cardiovascular;;  Inflow Cephalic Vein   PERIPHERAL VASCULAR INTERVENTION Left 02/20/2023   Procedure: PERIPHERAL VASCULAR INTERVENTION;  Surgeon: Norine Manuelita LABOR, MD;  Location: [facility] INVASIVE CV LAB;  Service: Cardiovascular;  Laterality: Left;  va stent   POLYPECTOMY  10/24/2020   Procedure: POLYPECTOMY;  Surgeon: Saintclair Jasper, MD;  Location: [facility];  Service: Gastroenterology;;   REDUCTION MAMMAPLASTY Bilateral 1985   THROMBECTOMY BRACHIAL ARTERY  01/18/2022   Procedure: THROMBECTOMY TO LEFT CEPHALIC VEIN;  Surgeon: Sheree Penne Bruckner, MD;  Location: [facility] OR;  Service: Vascular;;   TOE SURGERY     yrs ago per pt on 02-02-2021   TOTAL ABDOMINAL HYSTERECTOMY  1997   partial hysterectomy- still has ovaries   TOTAL KNEE ARTHROPLASTY Left 07/01/2019   Procedure: TOTAL KNEE ARTHROPLASTY;  Surgeon: Ernie Cough, MD;  Location: [facility] ORS;  Service: Orthopedics;  Laterality: Left;  70 min NO BLOOD PRODUCTS!!   TREATMENT FISTULA ANAL     yrs ago per pt 0n 02-02-2021   TRIGGER FINGER RELEASE Left 02/08/2021   Procedure: Left Ring trigger finger release, left ring finger volar ganglion cyst excision;  Surgeon: Alyse Lynwood, MD;  Location: [facility];  Service: Orthopedics;  Laterality: Left;  with local anesthesia   VENOUS ANGIOPLASTY Left 05/12/2023   Procedure: VENOUS ANGIOPLASTY;  Surgeon: Tobie Gordy POUR, MD;  Location: [facility] INVASIVE CV LAB;  Service: Cardiovascular;  Laterality: Left;  70% Inflow Cephalic Vein   VENOUS ANGIOPLASTY   09/12/2023   Procedure: VENOUS ANGIOPLASTY;  Surgeon: Serene Gaile ORN, MD;  Location: [facility] LAB;  Service: Cardiovascular;;  anastomosis   VENOUS ANGIOPLASTY Left 01/09/2024   Procedure: VENOUS ANGIOPLASTY;  Surgeon: Sheree Penne Bruckner, MD;  Location: [facility] LAB;  Service: Cardiovascular;  Laterality: Left;  Outflow In-Stent Stenosis; Inflow Cephalic Vein    Allergies  Allergies  Allergen Reactions   Other     No blood products   Sulfa Antibiotics Rash    History of Present Illness    Lexys Milliner has a PMH of coronary artery disease, hyperlipidemia, aortic atherosclerosis, essential hypertension, end-stage renal disease, chronic diastolic CHF, elevated BMI, and nonrheumatic mitral valve regurgitation.  She was seen in follow-up by Dr.Thukkani on 04/07/2023.  She also presented for preoperative cardiac evaluation for spinal nerve stimulator.  She remained stable from a cardiac standpoint.  She was instructed to hold Plavix  until her procedure then restart after.  Her blood pressure was well-controlled.  She continued HD.  She is not a candidate for SGLT 2 inhibitor due to ESRD.  Her fluid volume was controlled with HD.  For further evaluation of her chronic diastolic CHF CMR was recommended due to speckled appearance of her echocardiogram.  It was not clear whether nerve stimulator would be compatible with CMR.  Her mitral valve regurgitation was also reviewed.  She was noted to have mild MR on echocardiogram 9/24.  She underwent AV fistulogram on 01/09/2024 for malfunctioning left arm AV fistula/graft.  She had percutaneous ultrasound-guided access of upper left arm AV shunt in anterograde and retrograde directions.  She had angioplasty at area of in-stent restenosis of left AV fistula shunt with 8 mm balloon.  She had angioplasty of proximal AV fistula for recurrent stenosis with 6 mm balloon.  She was noted to have 3 areas of stenosis in her outflow stent with the greatest measuring  approximately 80%.  After procedure she was noted to have much stronger thrill throughout fistula graft and into her stents.  She tolerated the procedure well without complication.  She presents to the clinic today for follow-up evaluation and preoperative cardiac evaluation.  She states***.  *** denies chest pain, shortness of breath, lower extremity edema, fatigue, palpitations, melena, hematuria, hemoptysis, diaphoresis, weakness, presyncope, syncope, orthopnea, and PND.  Coronary artery disease-no chest pain today.  Denies exertional chest discomfort. Heart healthy low-sodium diet Increase physical activity as tolerated Continue atorvastatin , Plavix   Hyperlipidemia-04/07/2023: Cholesterol, Total 91; HDL 50; LDL Chol Calc (NIH) 28; Triglycerides 50 High-fiber diet Continue atorvastatin , Plavix   Essential hypertension-BP today***. Maintain blood pressure log Continue current medical therapy  End-stage renal disease-continues with HD***.  Underwent AV fistulogram and angioplasty 01/09/2024 with [facility]. Continue HD Follows with nephrology  Chronic diastolic CHF-no increased DOE or activity intolerance.  Breathing at baseline.  Echocardiogram 10/28/2022 showed an LVEF of 60-65%, and slightly decreased mean gradient across aortic valve measuring 9 mmHg.  Severe concentric LVH and intermediate diastolic parameters were noted.  Severely dilated left atria and moderately dilated right atria were noted.  She was noted to have small pericardial effusion.  Her mitral valve showed mild mitral valve regurgitation with mild mitral stenosis and severe mitral annular calcification.   Continue metoprolol , hydralazine  Heart healthy low-sodium diet Fluid managed by HD Repeat echocardiogram when clinically indicated  Preoperative cardiac evaluation-Colonoscopy/ endoscopy   Date of Surgery:  Clearance 02/09/24                                  Surgeon:  Dr. Estelita Manas Surgeon's Group or Practice Name:   [facility] Phone number:  [phone] Fax number:  [phone]    Primary Cardiologist: Arun K Thukkani, MD  Chart reviewed as part of pre-operative protocol coverage. Given past medical history and time since last visit, based on ACC/AHA guidelines, Kamla Skilton would be at acceptable risk for the planned procedure without further cardiovascular testing.   Patient was advised that if he/she*** develops new symptoms prior to surgery to contact our office to arrange a follow-up appointment.  He verbalized understanding.  If necessary her Plavix  may be held for 5 days prior to her procedure.  Please resume as soon as hemostasis is achieved.  I will route this recommendation to the requesting party via Epic fax function and remove from pre-op  pool.    Disposition: Follow-up with Dr.Thukkani in 4 to 6 months.   Home Medications    Prior  to Admission medications   Medication Sig Start Date End Date Taking? Authorizing Provider  acetaminophen  (TYLENOL ) 650 MG CR tablet Take 650 mg by mouth every 8 (eight) hours as needed for pain.    [provider]  allopurinol  (ZYLOPRIM ) 100 MG tablet Take 1 tablet (100 mg total) by mouth daily. Patient taking differently: Take 100 mg by mouth at bedtime. 10/26/21 05/08/23  Arrien, Mauricio Daniel, MD  atorvastatin  (LIPITOR ) 80 MG tablet TAKE 1 TABLET BY MOUTH EVERYDAY AT BEDTIME 04/29/23   Thukkani, Arun K, MD  B Complex-C-Zn-Folic Acid  (DIALYVITE/ZINC) TABS Take 1 tablet by mouth every evening. 11/30/21   [provider]  clopidogrel  (PLAVIX ) 75 MG tablet TAKE 1 TABLET BY MOUTH EVERY DAY 04/21/23   Thukkani, Arun K, MD  fluticasone  (FLONASE ) 50 MCG/ACT nasal spray Place 1 spray into both nostrils daily as needed for allergies. 08/21/21   [provider]  hydrALAZINE  (APRESOLINE ) 50 MG tablet TAKE 1 TABLET BY MOUTH TWICE A DAY 04/16/23   Thukkani, Arun K, MD  insulin  glargine (LANTUS ) 100 UNIT/ML injection Inject 10 Units into  the skin daily.    [provider]  levocetirizine (XYZAL) 5 MG tablet Take 5 mg by mouth in the morning. 02/15/22   [provider]  levothyroxine  (SYNTHROID ) 112 MCG tablet Take 112 mcg by mouth daily at 2 am.    [provider]  LORazepam  (ATIVAN ) 1 MG tablet Take 1 mg by mouth daily as needed for anxiety. 11/30/21   [provider]  metoprolol  succinate (TOPROL  XL) 25 MG 24 hr tablet Take 1.5 tablets (37.5 mg total) by mouth at bedtime. 04/07/23   Thukkani, Arun K, MD  Multiple Vitamins-Minerals (PRESERVISION AREDS 2 PO) Take 1 tablet by mouth in the morning.    [provider]  nitroGLYCERIN  (NITROSTAT ) 0.4 MG SL tablet PLACE 1 TABLET (0.4 MG TOTAL) UNDER THE TONGUE EVERY 5 (FIVE) MINUTES X 3 DOSES AS NEEDED FOR CHEST PAIN. 09/23/22   Thukkani, Arun K, MD  Omega-3 Fatty Acids (FISH OIL) 1200 MG CAPS Take 1,200 mg by mouth at bedtime.    [provider]  Polyethyl Glycol-Propyl Glycol (SYSTANE) 0.4-0.3 % SOLN Place 1 drop into both eyes in the morning, at noon, in the evening, and at bedtime.    [provider]  polyethylene glycol (MIRALAX  / GLYCOLAX ) 17 g packet Take 17 g by mouth daily as needed for mild constipation or moderate constipation.    [provider]  sevelamer carbonate (RENVELA) 800 MG tablet Take 2,400 mg by mouth 3 (three) times daily with meals. 01/29/22   [provider]  tirzepatide  (MOUNJARO ) 5 MG/0.5ML Pen Inject 5 mg into the skin once a week. 12/03/23   Thukkani, Arun K, MD  traZODone  (DESYREL ) 150 MG tablet Take 150 mg by mouth at bedtime.    [provider]    Family History    Family History  Problem Relation Age of Onset   Prostate cancer Father    Congestive Heart Failure Mother    Diabetes Sister    Other Sister        septis   Breast cancer Neg Hx    She indicated that her mother is deceased. She indicated that her father is deceased. She indicated that one of her three  sisters is deceased. She indicated that her maternal grandmother is deceased. She indicated that her maternal grandfather is deceased. She indicated that her paternal grandmother is deceased. She indicated that her paternal  grandfather is deceased. She indicated that the status of her neg hx is unknown.  Social History    Social History   Socioeconomic History   Marital status: Single    Spouse name: Not on file   Number of children: 2   Years of education: Not on file   Highest education level: Not on file  Occupational History   Occupation: school bus driver  Tobacco Use   Smoking status: Never   Smokeless tobacco: Never  Vaping Use   Vaping status: Never Used  Substance and Sexual Activity   Alcohol  use: Not Currently   Drug use: No   Sexual activity: Not Currently    Birth control/protection: Surgical  Other Topics Concern   Not on file  Social History Narrative   Not on file   Social Drivers of Health   Financial Resource Strain: Low Risk (04/18/2023)   Received from [facility]   Overall Financial Resource Strain (CARDIA)    Difficulty of Paying Living Expenses: Not hard at all  Food Insecurity: No Food Insecurity (04/18/2023)   Received from [facility]   Hunger Vital Sign    Within the past 12 months, you worried that your food would run out before you got the money to buy more.: Never true    Within the past 12 months, the food you bought just didn't last and you didn't have money to get more.: Never true  Transportation Needs: No Transportation Needs (04/18/2023)   Received from [facility] - Transportation    Lack of Transportation (Medical): No    Lack of Transportation (Non-Medical): No  Physical Activity: Unknown (04/18/2023)   Received from [facility]   Exercise Vital Sign    On average, how many days per week do you engage in moderate to strenuous exercise (like a brisk walk)?: 0 days    Minutes of Exercise per Session: Not on file   Stress: No Stress Concern Present (04/18/2023)   Received from [facility] - Occupational Stress Questionnaire    Feeling of Stress : Not at all  Social Connections: Moderately Integrated (04/18/2023)   Received from [facility]   Social Network    How would you rate your social network (family, work, friends)?: Adequate participation with social networks  Intimate Partner Violence: Not At Risk (04/18/2023)   Received from [facility]   HITS    Over the last 12 months how often did your partner physically hurt you?: Never    Over the last 12 months how often did your partner insult you or talk down to you?: Never    Over the last 12 months how often did your partner threaten you with physical harm?: Never    Over the last 12 months how often did your partner scream or curse at you?: Never     Review of Systems    General:  No chills, fever, night sweats or weight changes.  Cardiovascular:  No chest pain, dyspnea on exertion, edema, orthopnea, palpitations, paroxysmal nocturnal dyspnea. Dermatological: No rash, lesions/masses Respiratory: No cough, dyspnea Urologic: No hematuria, dysuria Abdominal:   No nausea, vomiting, diarrhea, bright red blood per rectum, melena, or hematemesis Neurologic:  No visual changes, wkns, changes in mental status. All other systems reviewed and are otherwise negative except as noted above.  Physical Exam    VS:  There were no vitals taken for this visit. , BMI There is no height or  weight on file to calculate BMI. GEN: Well nourished, well developed, in no acute distress. HEENT: normal. Neck: Supple, no JVD, carotid bruits, or masses. Cardiac: RRR, no murmurs, rubs, or gallops. No clubbing, cyanosis, edema.  Radials/DP/PT 2+ and equal bilaterally.  Respiratory:  Respirations regular and unlabored, clear to auscultation bilaterally. GI: Soft, nontender, nondistended, BS + x 4. MS: no deformity or  atrophy. Skin: warm and dry, no rash. Neuro:  Strength and sensation are intact. Psych: Normal affect.  Accessory Clinical Findings    Recent Labs: 09/24/2023: ALT 17; BUN 43; Creatinine 6.60; Hemoglobin 10.4; Platelet Count 142; Potassium 4.9; Sodium 139   Recent Lipid Panel    Component Value Date/Time   CHOL 91 (L) 04/07/2023 1049   TRIG 50 04/07/2023 1049   HDL 50 04/07/2023 1049   CHOLHDL 1.8 04/07/2023 1049   CHOLHDL 2.0 08/12/2022 1608   VLDL 10 05/11/2021 2339   LDLCALC 28 04/07/2023 1049   LDLCALC 31 08/12/2022 1608    No BP recorded.  {Refresh Note OR Click here to enter BP  :1}***    ECG personally reviewed by me today- ***    Echocardiogram 10/28/2022  IMPRESSIONS     1. Compared to 05/12/21, mean gradient across aortic valve is slightly  decreased (9 mmHg) and not suggestive of AS; MV mean gradient 4.4 mmHg at  HR 56 suggestive of mild MS. Severe LVH with speckled appearance possibly  related to ESRD but infiltrative CM  such as amyloid should be considered.   2. Left ventricular ejection fraction, by estimation, is 60 to 65%. The  left ventricle has normal function. The left ventricle has no regional  wall motion abnormalities. There is severe concentric left ventricular  hypertrophy. Left ventricular diastolic   parameters are indeterminate. Elevated left atrial pressure.   3. Right ventricular systolic function is normal. The right ventricular  size is mildly enlarged. There is mildly elevated pulmonary artery  systolic pressure.   4. Left atrial size was severely dilated.   5. Right atrial size was mildly dilated.   6. A small pericardial effusion is present.   7. The mitral valve is normal in structure. Mild mitral valve  regurgitation. Mild mitral stenosis. Severe mitral annular calcification.   8. The aortic valve is tricuspid. Aortic valve regurgitation is not  visualized. Aortic valve sclerosis/calcification is present, without any  evidence of  aortic stenosis.   9. The inferior vena cava is dilated in size with >50% respiratory  variability, suggesting right atrial pressure of 8 mmHg.   FINDINGS   Left Ventricle: Left ventricular ejection fraction, by estimation, is 60  to 65%. The left ventricle has normal function. The left ventricle has no  regional wall motion abnormalities. The left ventricular internal cavity  size was normal in size. There is   severe concentric left ventricular hypertrophy. Left ventricular  diastolic parameters are indeterminate. Elevated left atrial pressure.   Right Ventricle: The right ventricular size is mildly enlarged. Right  ventricular systolic function is normal. There is mildly elevated  pulmonary artery systolic pressure. The tricuspid regurgitant velocity is  2.87 m/s, and with an assumed right atrial  pressure of 8 mmHg, the estimated right ventricular systolic pressure is  40.9 mmHg.   Left Atrium: Left atrial size was severely dilated.   Right Atrium: Right atrial size was mildly dilated.   Pericardium: A small pericardial effusion is present.   Mitral Valve: The mitral valve is normal in structure. Severe mitral  annular  calcification. Mild mitral valve regurgitation. Mild mitral valve  stenosis.   Tricuspid Valve: The tricuspid valve is normal in structure. Tricuspid  valve regurgitation is trivial. No evidence of tricuspid stenosis.   Aortic Valve: The aortic valve is tricuspid. Aortic valve regurgitation is  not visualized. Aortic valve sclerosis/calcification is present, without  any evidence of aortic stenosis. Aortic valve mean gradient measures 8.7  mmHg. Aortic valve peak gradient  measures 17.5 mmHg. Aortic valve area, by VTI measures 1.54 cm.   Pulmonic Valve: The pulmonic valve was normal in structure. Pulmonic valve  regurgitation is mild. No evidence of pulmonic stenosis.   Aorta: The aortic root is normal in size and structure.   Venous: The inferior vena  cava is dilated in size with greater than 50%  respiratory variability, suggesting right atrial pressure of 8 mmHg.   IAS/Shunts: No atrial level shunt detected by color flow Doppler.       Assessment & Plan   1.  ***   Josefa HERO. Kazi Montoro NP-C     01/12/2024, 7:16 AM [facility] [address] [place], [place] [address] Office [phone]    Notice: This dictation was prepared with Dragon dictation along with smaller phrase technology. Any transcriptional errors that result from this process are unintentional and may not be corrected upon review.   I spent***minutes examining this patient, reviewing medications, and using patient centered shared decision making involving their cardiac care.   I spent  20 minutes reviewing past medical history,  medications, and prior cardiac tests.

## 2024-01-14 ENCOUNTER — Ambulatory Visit: Attending: General Practice | Admitting: General Practice

## 2024-01-14 ENCOUNTER — Encounter: Payer: Self-pay | Admitting: General Practice

## 2024-01-14 VITALS — BP 100/56 | HR 74 | Ht 69.5 in | Wt 226.0 lb

## 2024-01-14 DIAGNOSIS — E785 Hyperlipidemia, unspecified: Secondary | ICD-10-CM | POA: Diagnosis not present

## 2024-01-14 DIAGNOSIS — I5032 Chronic diastolic (congestive) heart failure: Secondary | ICD-10-CM | POA: Diagnosis not present

## 2024-01-14 DIAGNOSIS — Z01810 Encounter for preprocedural cardiovascular examination: Secondary | ICD-10-CM | POA: Diagnosis not present

## 2024-01-14 DIAGNOSIS — I2510 Atherosclerotic heart disease of native coronary artery without angina pectoris: Secondary | ICD-10-CM | POA: Diagnosis not present

## 2024-01-14 DIAGNOSIS — N186 End stage renal disease: Secondary | ICD-10-CM | POA: Diagnosis not present

## 2024-01-14 DIAGNOSIS — I10 Essential (primary) hypertension: Secondary | ICD-10-CM | POA: Diagnosis not present

## 2024-01-14 NOTE — Patient Instructions (Signed)
 Medication Instructions:  Hold Hydralazine  only if your top blood pressure number under 100 *If you need a refill on your cardiac medications before your next appointment, please call your pharmacy*  Lab Work: NONE ORDERED If you have labs (blood work) drawn today and your tests are completely normal, you will receive your results only by: MyChart Message (if you have MyChart) OR A paper copy in the mail If you have any lab test that is abnormal or we need to change your treatment, we will call you to review the results.  Testing/Procedures: NONE ORDERED  Follow-Up: At [facility], you and your health needs are our priority.  As part of our continuing mission to provide you with exceptional heart care, our providers are all part of one team.  This team includes your primary Cardiologist (physician) and Advanced Practice Providers or APPs (Physician Assistants and Nurse Practitioners) who all work together to provide you with the care you need, when you need it.  Your next appointment:   6 Months (Contact office 04/13/2024 to schedule appointment)  Provider:   Arun K Thukkani, MD or Josefa Beauvais, NP   We recommend signing up for the patient portal called MyChart.  Sign up information is provided on this After Visit Summary.  MyChart is used to connect with patients for Virtual Visits (Telemedicine).  Patients are able to view lab/test results, encounter notes, upcoming appointments, etc.  Non-urgent messages can be sent to your provider as well.   To learn more about what you can do with MyChart, go to [email or web address].

## 2024-01-15 ENCOUNTER — Encounter (HOSPITAL_COMMUNITY): Payer: Self-pay | Admitting: Emergency Medicine

## 2024-01-15 ENCOUNTER — Emergency Department (HOSPITAL_COMMUNITY)
Admission: EM | Admit: 2024-01-15 | Discharge: 2024-01-16 | Disposition: A | Discharge status: Home / Self Care | Attending: Emergency Medicine | Admitting: Emergency Medicine

## 2024-01-15 ENCOUNTER — Other Ambulatory Visit: Payer: Self-pay

## 2024-01-15 ENCOUNTER — Emergency Department (HOSPITAL_COMMUNITY)

## 2024-01-15 DIAGNOSIS — Z79899 Other long term (current) drug therapy: Secondary | ICD-10-CM | POA: Diagnosis not present

## 2024-01-15 DIAGNOSIS — R9431 Abnormal electrocardiogram [ECG] [EKG]: Secondary | ICD-10-CM | POA: Diagnosis not present

## 2024-01-15 DIAGNOSIS — Z794 Long term (current) use of insulin: Secondary | ICD-10-CM | POA: Diagnosis not present

## 2024-01-15 DIAGNOSIS — Z992 Dependence on renal dialysis: Secondary | ICD-10-CM | POA: Diagnosis not present

## 2024-01-15 DIAGNOSIS — N186 End stage renal disease: Secondary | ICD-10-CM | POA: Insufficient documentation

## 2024-01-15 DIAGNOSIS — Z7901 Long term (current) use of anticoagulants: Secondary | ICD-10-CM | POA: Diagnosis not present

## 2024-01-15 DIAGNOSIS — R42 Dizziness and giddiness: Secondary | ICD-10-CM

## 2024-01-15 DIAGNOSIS — E860 Dehydration: Secondary | ICD-10-CM | POA: Insufficient documentation

## 2024-01-15 DIAGNOSIS — I959 Hypotension, unspecified: Secondary | ICD-10-CM | POA: Diagnosis not present

## 2024-01-15 LAB — CBC
HCT: 31.7 % — ABNORMAL LOW (ref 36.0–46.0)
Hemoglobin: 10.1 g/dL — ABNORMAL LOW (ref 12.0–15.0)
MCH: 29.4 pg (ref 26.0–34.0)
MCHC: 31.9 g/dL (ref 30.0–36.0)
MCV: 92.4 fL (ref 80.0–100.0)
Platelets: 146 K/uL — ABNORMAL LOW (ref 150–400)
RBC: 3.43 MIL/uL — ABNORMAL LOW (ref 3.87–5.11)
RDW: 16.3 % — ABNORMAL HIGH (ref 11.5–15.5)
WBC: 5.1 K/uL (ref 4.0–10.5)
nRBC: 0 % (ref 0.0–0.2)

## 2024-01-15 LAB — CBG MONITORING, ED: Glucose-Capillary: 143 mg/dL — ABNORMAL HIGH (ref 70–99)

## 2024-01-15 LAB — TSH: TSH: 1.38 u[IU]/mL (ref 0.350–4.500)

## 2024-01-15 NOTE — ED Triage Notes (Signed)
 BIB [facility] from home with worsening dizziness x 3 days. Has been checking blood pressures and they were in the 90s systolic and pt reports that she is usually in the 140s systolic. Tiffany [name], Sat dialysis with dialysis completed today. Vomiting x 4 weeks as wekk, Pt has EGD scheduled.   BP 138/81 Hr 70 Spo2 97% RA CBG 224 (After meal)

## 2024-01-15 NOTE — ED Provider Notes (Signed)
 [place] EMERGENCY DEPARTMENT AT [facility] Provider Note   CSN: [id number] Arrival date & time: 01/15/24  2114     Patient presents with: Dizziness   Tiffany [name] is a 73 y.o. female.    Dizziness Patient presents with dizziness.  More lightheadedness.  More if she bends over.  More if she stands up from sitting down.  Has had hypotension.  Pressures in the 90s.  Blood pressure normally run around 140.  Recent taken off her hydralazine  because her blood pressure had been going down.  Has had dialysis.  States that today she was dialyzed and weight was 103 and dialyze down to 101 but said they also were increasing her dry weight to 102 because her blood pressure has been low.  No chest pain.  No trouble breathing.  Has had some vomiting.  Not in the last couple days but has had some recently.  Is due to get endoscopy to evaluate it.     Prior to Admission medications  Medication Sig Start Date End Date Taking? Authorizing Provider  acetaminophen  (TYLENOL ) 650 MG CR tablet Take 650 mg by mouth every 8 (eight) hours as needed for pain.    [provider]  allopurinol  (ZYLOPRIM ) 100 MG tablet Take 1 tablet (100 mg total) by mouth daily. Patient taking differently: Take 100 mg by mouth at bedtime. 10/26/21 01/14/24  Arrien, Mauricio Daniel, MD  atorvastatin  (LIPITOR ) 80 MG tablet TAKE 1 TABLET BY MOUTH EVERYDAY AT BEDTIME 04/29/23   Thukkani, Arun K, MD  B Complex-C-Zn-Folic Acid  (DIALYVITE/ZINC) TABS Take 1 tablet by mouth every evening. 11/30/21   [provider]  clopidogrel  (PLAVIX ) 75 MG tablet TAKE 1 TABLET BY MOUTH EVERY DAY 04/21/23   Thukkani, Arun K, MD  fluticasone  (FLONASE ) 50 MCG/ACT nasal spray Place 1 spray into both nostrils daily as needed for allergies. 08/21/21   [provider]  hydrALAZINE  (APRESOLINE ) 25 MG tablet Take 25 mg by mouth 2 (two) times daily.    [provider]  insulin  glargine (LANTUS ) 100 UNIT/ML  injection Inject 10 Units into the skin daily.    [provider]  levocetirizine (XYZAL) 5 MG tablet Take 5 mg by mouth in the morning. 02/15/22   [provider]  levothyroxine  (SYNTHROID ) 112 MCG tablet Take 112 mcg by mouth daily at 2 am.    [provider]  LORazepam  (ATIVAN ) 1 MG tablet Take 1 mg by mouth daily as needed for anxiety. 11/30/21   [provider]  metoprolol  succinate (TOPROL  XL) 25 MG 24 hr tablet Take 1.5 tablets (37.5 mg total) by mouth at bedtime. 04/07/23   Thukkani, Arun K, MD  Multiple Vitamins-Minerals (PRESERVISION AREDS 2 PO) Take 1 tablet by mouth in the morning.    [provider]  nitroGLYCERIN  (NITROSTAT ) 0.4 MG SL tablet PLACE 1 TABLET (0.4 MG TOTAL) UNDER THE TONGUE EVERY 5 (FIVE) MINUTES X 3 DOSES AS NEEDED FOR CHEST PAIN. 09/23/22   Thukkani, Arun K, MD  Omega-3 Fatty Acids (FISH OIL) 1200 MG CAPS Take 1,200 mg by mouth at bedtime.    [provider]  Polyethyl Glycol-Propyl Glycol (SYSTANE) 0.4-0.3 % SOLN Place 1 drop into both eyes in the morning, at noon, in the evening, and at bedtime.    [provider]  polyethylene glycol (MIRALAX  / GLYCOLAX ) 17 g packet Take 17 g by mouth daily as needed for mild constipation or moderate constipation.    [provider]  sevelamer carbonate (  RENVELA) 800 MG tablet Take 2,400 mg by mouth 3 (three) times daily with meals. 01/29/22   [provider]  tirzepatide  (MOUNJARO ) 5 MG/0.5ML Pen Inject 5 mg into the skin once a week. 12/03/23   Thukkani, Arun K, MD  traZODone  (DESYREL ) 150 MG tablet Take 150 mg by mouth at bedtime.    [provider]    Allergies: Other and Sulfa antibiotics    Review of Systems  Neurological:  Positive for dizziness.    Updated Vital Signs BP (!) 101/48   Pulse 70   Temp 98.5 F (36.9 C)   Resp 19   Ht 5' 9.5 (1.765 m)   Wt 102.5 kg   SpO2 95%   BMI 32.90 kg/m   Physical Exam Vitals and nursing  note reviewed.  Cardiovascular:     Rate and Rhythm: Regular rhythm.  Pulmonary:     Breath sounds: No wheezing.  Abdominal:     Tenderness: There is no abdominal tenderness.  Musculoskeletal:     Cervical back: Neck supple.  Neurological:     Mental Status: She is alert.     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  Face symmetric.  Good grip strength bilaterally.     (all labs ordered are listed, but only abnormal results are displayed) Labs Reviewed  CBG MONITORING, ED - Abnormal; Notable for the following components:      Result Value   Glucose-Capillary 143 (*)    All other components within normal limits  COMPREHENSIVE METABOLIC PANEL WITH GFR  CBC    EKG: EKG Interpretation Date/Time:  Thursday January 15 2024 21:37:42 EST Ventricular Rate:  72 PR Interval:  174 QRS Duration:  76 QT Interval:  398 QTC Calculation: 435 R Axis:   100  Text Interpretation: Normal sinus rhythm Rightward axis Septal infarct , age undetermined Abnormal ECG When compared with ECG of 14-Jan-2024 13:54,  rate decreased Confirmed by Patsey Lot [phone]) on 01/15/2024 10:42:00 PM  Radiology: No results found.   Procedures   Medications Ordered in the ED - No data to display                                  Medical Decision Making Amount and/or Complexity of Data Reviewed Labs: ordered.  Patient with lightheadedness.  Sounds more orthostatic. Worse with bending over or standing up.  Has had to recently stop blood pressure medicines because of hypotension.  Has had dialysis done today.  Blood pressure mildly decreased now.  Most recent pressure was 101/48.  Will get basic blood work.  Reviewed cardiology note.  Care will be turned over to Dr. Jerral     Final diagnoses:  None    ED Discharge Orders     None          Patsey Lot, MD 01/15/24 2251

## 2024-01-15 NOTE — ED Provider Notes (Signed)
 Care of patient received from prior provider at 11:02 PM, please see their note for complete H/P and care plan.  Received handoff per ED course.  Clinical Course as of 01/16/24 0447  Thu Jan 15, 2024  2301 Stable HO NP CCX of dizziness. Hypotensive tonight. Recent medication changes and hydral discontinued recently. ESRD on HD Lab work still pending Recently increased her dry weight 2/2 said hypotension [CC]  2302 Symptomatic presyncope [CC]  Fri Jan 16, 2024  9675 Lab work grossly benign no focal pathology. [CC]  9667 o [CC]    Clinical Course User Index [CC] Jerral Meth, MD    Reassessment: Patient's reevaluation is grossly benign. She received 1 L IV fluid due to suspected volume depleted status after dialysis.  She looks grossly volume down.  They state that they have been increasing her dry weight due to ongoing presyncope.  They have also been decreasing her antihypertensives which she is still titrating off of. Patient feels symptomatically resolved after IV fluids.  Ambulation trial at this time with plan for reassessment again. Reassessment: Ambulation trial was successful.  She was able to ambulate without difficulty.  However she states that she is still having prodromal orthostatic dizziness.  Discussed that she is at risk for falls and she would feel more comfortable with observation.  Plan to admit to medicine for observation. Reassessment: I was once again called to bedside as patient is now stating that she no longer wants to be admitted and after her observation window feels completely symptomatically resolved.  States that she has follow-up with her dialysis clinic in 24 hours and would rather follow-up with them then stay in the hospital in the interim. Overall I feel patient is stable for outpatient care.  Presyncope can be safely evaluated in the outpatient setting as she does have ambulation assist device at home.  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  Strict return precautions reinforced.    Jerral Meth, MD 01/16/24 [phone]

## 2024-01-16 LAB — COMPREHENSIVE METABOLIC PANEL WITH GFR
ALT: 10 U/L (ref 0–44)
AST: 17 U/L (ref 15–41)
Albumin: 2.9 g/dL — ABNORMAL LOW (ref 3.5–5.0)
Alkaline Phosphatase: 132 U/L — ABNORMAL HIGH (ref 38–126)
Anion gap: 11 (ref 5–15)
BUN: 22 mg/dL (ref 8–23)
CO2: 33 mmol/L — ABNORMAL HIGH (ref 22–32)
Calcium: 9.2 mg/dL (ref 8.9–10.3)
Chloride: 96 mmol/L — ABNORMAL LOW (ref 98–111)
Creatinine, Ser: 5.02 mg/dL — ABNORMAL HIGH (ref 0.44–1.00)
GFR, Estimated: 9 mL/min — ABNORMAL LOW (ref >60)
Glucose, Bld: 141 mg/dL — ABNORMAL HIGH (ref 70–99)
Potassium: 4.2 mmol/L (ref 3.5–5.1)
Sodium: 140 mmol/L (ref 135–145)
Total Bilirubin: 0.6 mg/dL (ref 0.0–1.2)
Total Protein: 6.2 g/dL — ABNORMAL LOW (ref 6.5–8.1)

## 2024-01-16 LAB — T4, FREE: Free T4: 4.41 ng/dL — ABNORMAL HIGH (ref 0.61–1.12)

## 2024-01-16 MED ORDER — LACTATED RINGERS IV BOLUS
1000.0000 mL | Freq: Once | INTRAVENOUS | Status: AC
  Administered 2024-01-16: 1000 mL via INTRAVENOUS

## 2024-01-16 NOTE — ED Notes (Signed)
 Patient wants to go home states her sister works here and can drive her home , her son will be there to stay with her

## 2024-01-16 NOTE — ED Notes (Signed)
 Ambulated patient steady gait however c/o feeling dizzy when sitting and walking

## 2024-03-10 ENCOUNTER — Institutional Professional Consult (permissible substitution) (INDEPENDENT_AMBULATORY_CARE_PROVIDER_SITE_OTHER): Admitting: Otolaryngology

## 2024-03-10 ENCOUNTER — Other Ambulatory Visit: Payer: Self-pay | Admitting: Internal Medicine

## 2024-03-10 DIAGNOSIS — E1169 Type 2 diabetes mellitus with other specified complication: Secondary | ICD-10-CM

## 2024-03-11 NOTE — Telephone Encounter (Signed)
 Spoke to pt, tolerates 5 mg Mounjaro  well. Noted some appetite suppression but not much. Not keeping track on weight FBG ~80-100 still on 10 units basal insulin . Will up the Mounjaro  dose to 7.5 mg and advised patient to stop taking insulin  when start 7.5 mg Mounjaro  dose. Follow up in 4 week for further dose titration.
